# Patient Record
Sex: Male | Born: 1990 | Race: White | Hispanic: No | Marital: Single | Smoking: Former smoker
Health system: Southern US, Community
[De-identification: ages and names within clinical notes are randomized; demographics above are authoritative.]

## PROBLEM LIST (undated history)

## (undated) DIAGNOSIS — J45909 Unspecified asthma, uncomplicated: Secondary | ICD-10-CM

## (undated) DIAGNOSIS — I509 Heart failure, unspecified: Secondary | ICD-10-CM

## (undated) DIAGNOSIS — F1110 Opioid abuse, uncomplicated: Secondary | ICD-10-CM

---

## 2011-07-14 ENCOUNTER — Emergency Department (HOSPITAL_COMMUNITY)
Admission: EM | Admit: 2011-07-14 | Discharge: 2011-07-14 | Disposition: A | Discharge status: Home / Self Care | Payer: Self-pay | Attending: Emergency Medicine | Admitting: Emergency Medicine

## 2011-07-14 DIAGNOSIS — F1910 Other psychoactive substance abuse, uncomplicated: Secondary | ICD-10-CM | POA: Insufficient documentation

## 2011-07-14 DIAGNOSIS — Z0000 Encounter for general adult medical examination without abnormal findings: Secondary | ICD-10-CM | POA: Insufficient documentation

## 2011-07-14 DIAGNOSIS — J45909 Unspecified asthma, uncomplicated: Secondary | ICD-10-CM | POA: Insufficient documentation

## 2012-04-09 ENCOUNTER — Encounter (HOSPITAL_COMMUNITY): Payer: Self-pay | Admitting: Family Medicine

## 2012-04-09 ENCOUNTER — Emergency Department (HOSPITAL_COMMUNITY)
Admission: EM | Admit: 2012-04-09 | Discharge: 2012-04-09 | Disposition: A | Discharge status: Home / Self Care | Payer: No Typology Code available for payment source | Attending: Emergency Medicine | Admitting: Emergency Medicine

## 2012-04-09 DIAGNOSIS — M62838 Other muscle spasm: Secondary | ICD-10-CM | POA: Insufficient documentation

## 2012-04-09 DIAGNOSIS — M542 Cervicalgia: Secondary | ICD-10-CM | POA: Insufficient documentation

## 2012-04-09 DIAGNOSIS — Z7982 Long term (current) use of aspirin: Secondary | ICD-10-CM | POA: Insufficient documentation

## 2012-04-09 DIAGNOSIS — M25519 Pain in unspecified shoulder: Secondary | ICD-10-CM | POA: Insufficient documentation

## 2012-04-09 HISTORY — DX: Unspecified asthma, uncomplicated: J45.909

## 2012-04-09 MED ORDER — METHOCARBAMOL 500 MG PO TABS: 1000.0000 mg | ORAL_TABLET | Freq: Four times a day (QID) | ORAL | Status: AC

## 2012-04-09 MED ORDER — IBUPROFEN 800 MG PO TABS: 800.0000 mg | ORAL_TABLET | Freq: Three times a day (TID) | ORAL | Status: AC | PRN
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
## 2012-04-09 NOTE — ED Provider Notes (Signed)
Medical screening examination/treatment/procedure(s) were performed by non-physician practitioner and as supervising physician I was immediately available for consultation/collaboration.   Dayton Bailiff, MD 04/09/12 2226
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
## 2013-01-25 ENCOUNTER — Encounter (HOSPITAL_COMMUNITY): Payer: Self-pay | Admitting: *Deleted

## 2013-01-25 ENCOUNTER — Emergency Department (HOSPITAL_COMMUNITY)
Admission: EM | Admit: 2013-01-25 | Discharge: 2013-01-26 | Disposition: A | Discharge status: Rehabilitation Facility | Payer: Self-pay | Attending: Emergency Medicine | Admitting: Emergency Medicine

## 2013-01-25 DIAGNOSIS — J45909 Unspecified asthma, uncomplicated: Secondary | ICD-10-CM | POA: Insufficient documentation

## 2013-01-25 DIAGNOSIS — F17200 Nicotine dependence, unspecified, uncomplicated: Secondary | ICD-10-CM | POA: Insufficient documentation

## 2013-01-25 DIAGNOSIS — R4589 Other symptoms and signs involving emotional state: Secondary | ICD-10-CM | POA: Insufficient documentation

## 2013-01-25 DIAGNOSIS — F19939 Other psychoactive substance use, unspecified with withdrawal, unspecified: Secondary | ICD-10-CM | POA: Insufficient documentation

## 2013-01-25 DIAGNOSIS — Z7982 Long term (current) use of aspirin: Secondary | ICD-10-CM | POA: Insufficient documentation

## 2013-01-25 DIAGNOSIS — F1123 Opioid dependence with withdrawal: Secondary | ICD-10-CM

## 2013-01-25 DIAGNOSIS — R6883 Chills (without fever): Secondary | ICD-10-CM | POA: Insufficient documentation

## 2013-01-25 DIAGNOSIS — R110 Nausea: Secondary | ICD-10-CM | POA: Insufficient documentation

## 2013-01-25 LAB — CBC
HCT: 42.8 % (ref 39.0–52.0)
MCH: 29.5 pg (ref 26.0–34.0)
MCV: 85.4 fL (ref 78.0–100.0)
RBC: 5.01 MIL/uL (ref 4.22–5.81)
WBC: 6 10*3/uL (ref 4.0–10.5)

## 2013-01-25 LAB — RAPID URINE DRUG SCREEN, HOSP PERFORMED
Opiates: POSITIVE — AB
Tetrahydrocannabinol: POSITIVE — AB

## 2013-01-25 LAB — COMPREHENSIVE METABOLIC PANEL
BUN: 12 mg/dL (ref 6–23)
CO2: 31 mEq/L (ref 19–32)
Chloride: 97 mEq/L (ref 96–112)
Creatinine, Ser: 0.9 mg/dL (ref 0.50–1.35)
GFR calc Af Amer: >90 mL/min (ref >90)
GFR calc non Af Amer: >90 mL/min (ref >90)
Glucose, Bld: 86 mg/dL (ref 70–99)
Total Bilirubin: 0.3 mg/dL (ref 0.3–1.2)

## 2013-01-25 LAB — ETHANOL: Alcohol, Ethyl (B): <11 mg/dL (ref 0–11)

## 2013-01-25 MED ORDER — METHOCARBAMOL 500 MG PO TABS: 500.0000 mg | ORAL_TABLET | Freq: Three times a day (TID) | ORAL | Status: DC | PRN

## 2013-01-25 MED ORDER — NAPROXEN 500 MG PO TABS: 500.0000 mg | ORAL_TABLET | Freq: Two times a day (BID) | ORAL | Status: DC | PRN

## 2013-01-25 MED ORDER — HYDROXYZINE HCL 25 MG PO TABS: 25.0000 mg | ORAL_TABLET | Freq: Four times a day (QID) | ORAL | Status: DC | PRN

## 2013-01-25 MED ORDER — CLONIDINE HCL 0.1 MG PO TABS: 0.1000 mg | ORAL_TABLET | ORAL | Status: DC

## 2013-01-25 MED ORDER — LOPERAMIDE HCL 2 MG PO CAPS: 2.0000 mg | ORAL_CAPSULE | ORAL | Status: DC | PRN

## 2013-01-25 MED ORDER — NICOTINE 21 MG/24HR TD PT24: 21.0000 mg | MEDICATED_PATCH | Freq: Every day | TRANSDERMAL | Status: DC

## 2013-01-25 MED ORDER — CLONIDINE HCL 0.1 MG PO TABS: 0.1000 mg | ORAL_TABLET | Freq: Every day | ORAL | Status: DC

## 2013-01-25 MED ORDER — DICYCLOMINE HCL 20 MG PO TABS: 20.0000 mg | ORAL_TABLET | Freq: Four times a day (QID) | ORAL | Status: DC | PRN

## 2013-01-25 MED ORDER — ONDANSETRON 4 MG PO TBDP: 4.0000 mg | ORAL_TABLET | Freq: Four times a day (QID) | ORAL | Status: DC | PRN

## 2013-01-25 MED ORDER — CLONIDINE HCL 0.1 MG PO TABS
0.1000 mg | ORAL_TABLET | Freq: Four times a day (QID) | ORAL | Status: DC
  Administered 2013-01-26: 0.1 mg via ORAL
  Filled 2013-01-25: qty 1

## 2013-01-25 NOTE — ED Notes (Signed)
Pt states he's here for detox from heroin, states smokes weed sometimes and takes benzo's sometimes but mainly wants detox from heroin, pt states last time he did heroin was couple hours ago, denies pain, denies SI/HI.

## 2013-01-25 NOTE — ED Provider Notes (Signed)
History    This chart was scribed for non-physician practitioner Renne Crigler PA-C working with Richardean Canal, MD by Smitty Pluck, ED scribe. This patient was seen in room WTR4/WLPT4 and the patient's care was started at 6:02 PM.   CSN: [id number]  Arrival date & time 01/25/13  1752      Chief Complaint  Patient presents with  . Medical Clearance    The history is provided by the patient and medical records. No language interpreter was used.   Clifford Clark is a 22 y.o. male who presents to the Emergency Department due to detox from heroin. He reports that he was clean for 5 months but relapsed 2 months ago. He lasted used 0.5 grams of heroin today. He states he is on the waiting list for recovery facility in [place]. He states he has been to [facility] in [place] for detox in the past. He states he smokes weed and uses benzos occasionally. He states that when he withdraws he has nausea, restlessness and episodes of hot/chills. Pt denies SI, HI, hallucinations, fever, chills, nausea, vomiting, diarrhea, weakness, cough, SOB and any other pain. Aggravating factors: none. Alleviating factors: none.      Past Medical History  Diagnosis Date  . Asthma     History reviewed. No pertinent past surgical history.  History reviewed. No pertinent family history.  History  Substance Use Topics  . Smoking status: Current Some Day Smoker  . Smokeless tobacco: Never Used  . Alcohol Use: No      Review of Systems  Constitutional: Negative for fever and chills.  HENT: Negative for sore throat and rhinorrhea.   Eyes: Negative for redness.  Respiratory: Negative for cough and shortness of breath.   Cardiovascular: Negative for chest pain.  Gastrointestinal: Positive for nausea. Negative for vomiting, abdominal pain and diarrhea.  Genitourinary: Negative for dysuria.  Musculoskeletal: Negative for myalgias.  Skin: Negative for rash.  Neurological: Negative for weakness and headaches.     Allergies  Review of patient's allergies indicates no known allergies.  Home Medications   Current Outpatient Rx  Name  Route  Sig  Dispense  Refill  . aspirin EC 81 MG tablet   Oral   Take 324 mg by mouth once.           BP 123/65  Pulse 78  Temp(Src) 98.6 F (37 C) (Oral)  Resp 16  Ht 5\' 7"  (1.702 m)  Wt 145 lb (65.772 kg)  BMI 22.71 kg/m2  SpO2 98%  Physical Exam  Nursing note and vitals reviewed. Constitutional: He is oriented to person, place, and time. He appears well-developed and well-nourished. No distress.  HENT:  Head: Normocephalic and atraumatic.  Eyes: EOM are normal.  Neck: Neck supple. No tracheal deviation present.  Cardiovascular: Normal rate.   Pulmonary/Chest: Effort normal. No respiratory distress.  Musculoskeletal: Normal range of motion.  Neurological: He is alert and oriented to person, place, and time.  Skin: Skin is warm and dry.  Psychiatric: He has a normal mood and affect. His behavior is normal.    ED Course  Procedures (including critical care time) DIAGNOSTIC STUDIES: Oxygen Saturation is 98% on room air, normal by my interpretation.    COORDINATION OF CARE: 6:05 PM Discussed ED treatment with pt and pt agrees.     Labs Reviewed  COMPREHENSIVE METABOLIC PANEL - Abnormal; Notable for the following:    Potassium 3.4 (*)    All other components within normal limits  URINE RAPID DRUG SCREEN (HOSP PERFORMED) - Abnormal; Notable for the following:    Opiates POSITIVE (*)    Tetrahydrocannabinol POSITIVE (*)    All other components within normal limits  CBC  ETHANOL   No results found.   1. Heroin withdrawal     Patient seen and examined. Work-up initiated. Clonidine protocol initiated. Patient requests inpt detox.   Vital signs reviewed and are as follows: Filed Vitals:   01/25/13 1854  BP: 109/64  Pulse: 75  Temp:   Resp: 16   7:08 PM Spoke with [facility] who will see. [facility] may be possibility tomorrow.    MDM   Heroin detox. Pt medically cleared.       I personally performed the services described in this documentation, which was scribed in my presence. The recorded information has been reviewed and is accurate.    Renne Crigler, PA-C 01/25/13 1913

## 2013-01-25 NOTE — ED Notes (Signed)
Pt resting quietly. Eating sandwich and reading a book. No complaints at this time. Pt is calm, cooperative.
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
## 2013-01-25 NOTE — BH Assessment (Signed)
Assessment Note   Clifford Clark is an 22 y.o. male who presents to [facility] seeking detox from heroin.  He reports that his life isn't moving forward and he recognizes that that is because of his drug use.  He reports using a half gram of heroin daily for the last 1-2 mos. He has detoxed previously, this time last year, and was sober for 6 mos.  He denies SI, HI-thoughts, plan or intent, now or in the last six months, and also denies AVH.  He is calm and cooperative and motivated for treatment.  He not a veteran, has no open wounds, denies any history of assault or sexual assault.    Axis I: Opioid Dependence Axis II: Deferred Axis III:  Past Medical History  Diagnosis Date  . Asthma    Axis IV: economic problems, housing problems and problems with access to health care services Axis V: 41-50 serious symptoms  Past Medical History:  Past Medical History  Diagnosis Date  . Asthma     History reviewed. No pertinent past surgical history.  Family History: History reviewed. No pertinent family history.  Social History:  reports that he has been smoking.  He has never used smokeless tobacco. He reports that he uses illicit drugs (Marijuana). He reports that he does not drink alcohol.  Additional Social History:  Alcohol / Drug Use History of alcohol / drug use?: Yes Longest period of sobriety (when/how long): 6 mos Negative Consequences of Use: Personal relationships;Work / Mining engineer #1 Name of Substance 1: Heroin 1 - Age of First Use: 19 1 - Amount (size/oz): .5 gram 1 - Frequency: daily 1 - Duration: 1-2 mos 1 - Last Use / Amount: 01/25/13 .5 gram Substance #2 Name of Substance 2: Xanax 2 - Age of First Use: 15 2 - Amount (size/oz): 2mg  2 - Frequency: 1 x a month 2 - Duration: 8 years 2 - Last Use / Amount: 01/20/13 Substance #3 Name of Substance 3: Marijuana 3 - Age of First Use: 14 3 - Amount (size/oz): gram 3 - Frequency: weekly 3 - Duration: ongoing 3 - Last Use /  Amount: 01/24/13  CIWA: CIWA-Ar BP: 109/64 mmHg Pulse Rate: 75 COWS: Clinical Opiate Withdrawal Scale (COWS) Resting Pulse Rate: Pulse Rate 80 or below Sweating: No report of chills or flushing Restlessness: Able to sit still Pupil Size: Pupils pinned or normal size for room light Bone or Joint Aches: Not present Runny Nose or Tearing: Not present GI Upset: No GI symptoms Tremor: No tremor Yawning: No yawning Anxiety or Irritability: None Gooseflesh Skin: Skin is smooth COWS Total Score: 0  Allergies: No Known Allergies  Home Medications:  (Not in a hospital admission)  OB/GYN Status:  No LMP for male patient.  General Assessment Data Location of Assessment: [facility] ED Living Arrangements: Alone Can pt return to current living arrangement?: Yes Admission Status: Voluntary Is patient capable of signing voluntary admission?: Yes Transfer from: Acute Hospital Referral Source: Psychiatrist  Education Status Is patient currently in school?: No Highest grade of school patient has completed: 12  Risk to self Suicidal Ideation: No Suicidal Intent: No Is patient at risk for suicide?: No Suicidal Plan?: No Access to Means: No What has been your use of drugs/alcohol within the last 12 months?: ongoing Previous Attempts/Gestures: No Intentional Self Injurious Behavior: None Family Suicide History: No Recent stressful life event(s): Recent negative physical changes (life isn't moving forward) Persecutory voices/beliefs?: No Depression: No Substance abuse history and/or treatment for substance  abuse?: Yes Suicide prevention information given to non-admitted patients: Yes  Risk to Others Homicidal Ideation: No Thoughts of Harm to Others: No Current Homicidal Intent: No Current Homicidal Plan: No Access to Homicidal Means: No History of harm to others?: No Assessment of Violence: None Noted Does patient have access to weapons?: No Criminal Charges Pending?: No Does patient  have a court date: No  Psychosis Hallucinations: None noted Delusions: None noted  Mental Status Report Appear/Hygiene: Other (Comment) (neat) Eye Contact: Good Motor Activity: Freedom of movement Speech: Logical/coherent Level of Consciousness: Alert Mood: Other (Comment) Affect: Appropriate to circumstance Anxiety Level: None Thought Processes: Coherent;Relevant Judgement: Unimpaired Orientation: Person;Place;Time;Situation Obsessive Compulsive Thoughts/Behaviors: None  Cognitive Functioning Concentration: Normal Memory: Recent Intact;Remote Intact IQ: Average Insight: Good Impulse Control: Poor Appetite: Fair Sleep: No Change Total Hours of Sleep: 7 Vegetative Symptoms: None  ADLScreening [facility] Assessment Services) Patient's cognitive ability adequate to safely complete daily activities?: Yes Patient able to express need for assistance with ADLs?: Yes Independently performs ADLs?: Yes (appropriate for developmental age)  Abuse/Neglect [facility]) Physical Abuse: Denies Verbal Abuse: Denies Sexual Abuse: Denies  Prior Inpatient Therapy Prior Inpatient Therapy: Yes Prior Therapy Dates: 2012 Prior Therapy Facilty/Provider(s): [facility] Reason for Treatment: Heroin Detox and treatment  Prior Outpatient Therapy Prior Outpatient Therapy: Yes Prior Therapy Dates: ongoing Prior Therapy Facilty/Provider(s): [facility] Reason for Treatment: SA  ADL Screening (condition at time of admission) Patient's cognitive ability adequate to safely complete daily activities?: Yes Patient able to express need for assistance with ADLs?: Yes Independently performs ADLs?: Yes (appropriate for developmental age) Weakness of Legs: None Weakness of Arms/Hands: None       Abuse/Neglect Assessment (Assessment to be complete while patient is alone) Physical Abuse: Denies Verbal Abuse: Denies Sexual Abuse: Denies Exploitation of patient/patient's resources: Denies Values / Beliefs Cultural  Requests During Hospitalization: None Spiritual Requests During Hospitalization: None   Advance Directives (For Healthcare) Advance Directive: Patient does not have advance directive Pre-existing out of facility DNR order (yellow form or pink MOST form): No Nutrition Screen- [facility] Adult/WL/AP Patient's home diet: Regular  Additional Information 1:1 In Past 12 Months?: No CIRT Risk: No Elopement Risk: No Does patient have medical clearance?: Yes     Disposition:  Disposition Initial Assessment Completed for this Encounter: Yes Disposition of Patient: Inpatient treatment program Type of inpatient treatment program: Adult  On Site Evaluation by:   Reviewed with Physician:     Steward Ros 01/25/2013 9:34 PM

## 2013-01-25 NOTE — ED Provider Notes (Signed)
Medical screening examination/treatment/procedure(s) were performed by non-physician practitioner and as supervising physician I was immediately available for consultation/collaboration.   David H Yao, MD 01/25/13 2158 

## 2013-01-26 NOTE — ED Notes (Addendum)
Patient  up for discharge. Patient plans to drive self to residential treatment services. Please call them when he leaves to inform [facility] of when the patient leaves, so they will know when to expect him. At [phone].

## 2013-01-26 NOTE — ED Notes (Signed)
Pt discharged via self; pt driving self to residential services; stated understanding; complaint with treatment program; stable at time of discharge; facility called and notified of patients discharge
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
## 2013-01-26 NOTE — ED Notes (Signed)
Pt given sandwich 

## 2013-01-26 NOTE — BH Assessment (Signed)
BHH Assessment Progress Note      PT accepted to [facility].  Obtained authorization for treatment from [facility] at [place].  [id number] dates 01/26/13-01/28/13

## 2018-01-16 ENCOUNTER — Emergency Department (HOSPITAL_COMMUNITY)
Admission: EM | Admit: 2018-01-16 | Discharge: 2018-01-17 | Disposition: A | Discharge status: Home / Self Care | Payer: Self-pay | Attending: Emergency Medicine | Admitting: Emergency Medicine

## 2018-01-16 ENCOUNTER — Other Ambulatory Visit: Payer: Self-pay

## 2018-01-16 ENCOUNTER — Encounter (HOSPITAL_COMMUNITY): Payer: Self-pay

## 2018-01-16 ENCOUNTER — Emergency Department (HOSPITAL_COMMUNITY): Payer: Self-pay

## 2018-01-16 DIAGNOSIS — T401X1A Poisoning by heroin, accidental (unintentional), initial encounter: Secondary | ICD-10-CM | POA: Insufficient documentation

## 2018-01-16 DIAGNOSIS — F17210 Nicotine dependence, cigarettes, uncomplicated: Secondary | ICD-10-CM | POA: Insufficient documentation

## 2018-01-16 LAB — COMPREHENSIVE METABOLIC PANEL
ALT: 114 U/L — AB (ref 17–63)
AST: 105 U/L — AB (ref 15–41)
Albumin: 4 g/dL (ref 3.5–5.0)
Alkaline Phosphatase: 87 U/L (ref 38–126)
Anion gap: 10 (ref 5–15)
BILIRUBIN TOTAL: 0.6 mg/dL (ref 0.3–1.2)
BUN: 14 mg/dL (ref 6–20)
CO2: 29 mmol/L (ref 22–32)
CREATININE: 1.03 mg/dL (ref 0.61–1.24)
Calcium: 9.3 mg/dL (ref 8.9–10.3)
Chloride: 102 mmol/L (ref 101–111)
Glucose, Bld: 114 mg/dL — ABNORMAL HIGH (ref 65–99)
Potassium: 3.3 mmol/L — ABNORMAL LOW (ref 3.5–5.1)
Sodium: 141 mmol/L (ref 135–145)
TOTAL PROTEIN: 6.8 g/dL (ref 6.5–8.1)

## 2018-01-16 LAB — CBC
HCT: 41.6 % (ref 39.0–52.0)
Hemoglobin: 13.6 g/dL (ref 13.0–17.0)
MCH: 29.5 pg (ref 26.0–34.0)
MCHC: 32.7 g/dL (ref 30.0–36.0)
MCV: 90.2 fL (ref 78.0–100.0)
PLATELETS: 291 10*3/uL (ref 150–400)
RBC: 4.61 MIL/uL (ref 4.22–5.81)
RDW: 12.7 % (ref 11.5–15.5)
WBC: 9.4 10*3/uL (ref 4.0–10.5)

## 2018-01-16 LAB — ETHANOL

## 2018-01-16 LAB — SALICYLATE LEVEL

## 2018-01-16 LAB — CBG MONITORING, ED: GLUCOSE-CAPILLARY: 105 mg/dL — AB (ref 65–99)

## 2018-01-16 LAB — ACETAMINOPHEN LEVEL: Acetaminophen (Tylenol), Serum: <10 ug/mL — ABNORMAL LOW (ref 10–30)

## 2018-01-16 MED ORDER — ONDANSETRON HCL 4 MG/2ML IJ SOLN
4.0000 mg | Freq: Once | INTRAMUSCULAR | Status: AC
  Administered 2018-01-16: 4 mg via INTRAVENOUS
  Filled 2018-01-16: qty 2

## 2018-01-16 MED ORDER — SODIUM CHLORIDE 0.9 % IV BOLUS
1000.0000 mL | Freq: Once | INTRAVENOUS | Status: AC
  Administered 2018-01-16: 1000 mL via INTRAVENOUS

## 2018-01-16 NOTE — ED Notes (Signed)
Clifford Clark H[place] is patient's sponsor. [phone]. He will pick patient up.
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
## 2018-01-16 NOTE — ED Notes (Signed)
Urinal, labeled specimen cup at bedside. Pt encouraged to provide sample when possible; call bell at bedside for ring for assistance PRN. [employer]

## 2018-01-16 NOTE — ED Provider Notes (Signed)
[facility]-EMERGENCY DEPT Provider Note   CSN: [id number] Arrival date & time: 01/16/18  2202     History   Chief Complaint Chief Complaint  Patient presents with  . Drug Overdose    HPI Clifford Clark is a 27 y.o. male presenting for evaluation after overdose.  Patient states that he shot heroin into his veins this evening.  He is not sure exactly what time this was, approximately 9:00.  This is last thing he remembers.  He then was being woken up by EMS.  He denies other drug use.  No other medical problems, does not take medications daily.  He states that her own was not cut with anything.  He denies recent fever, chills, chest pain, shortness breath, cough, nausea, vomiting, abdominal pain, urinary symptoms, abnormal bowel movements.  He currently reports nausea and cervical spine pain.  Pt states he was not using drugs to try and kill himself, denies SI, HI, or AVH.  Per [employer], patient was picked up by his sponsor around 4:00 this afternoon.  He appeared high at the time.  Around 9:00, patient went to take a shower.  915, a checked on the patient and he was slumped over in the bathroom and not breathing.  He had strong pulses.  Compressions were started until EMS arrived.  Patient was given Narcan and immediately became alert and oriented. He has remained alert since.   HPI  Past Medical History:  Diagnosis Date  . Asthma     There are no active problems to display for this patient.   History reviewed. No pertinent surgical history.      Home Medications    Prior to Admission medications   Medication Sig Start Date End Date Taking? Authorizing Provider  naloxone [facility]) nasal spray 4 mg/0.1 mL Use as needed for heroin overdose 01/17/18   Morio Widen, PA-C    Family History History reviewed. No pertinent family history.  Social History Social History   Tobacco Use  . Smoking status: Current Some Day Smoker  . Smokeless tobacco: Never  Used  Substance Use Topics  . Alcohol use: No  . Drug use: Yes    Types: Marijuana, IV    Comment: Heroin     Allergies   Patient has no known allergies.   Review of Systems Review of Systems  Respiratory: Positive for apnea (resolved).   Cardiovascular: Positive for chest pain (soreness from compressions).  Musculoskeletal: Positive for neck pain.  All other systems reviewed and are negative.   Physical Exam Updated Vital Signs BP 109/68   Pulse 79   Resp 13   SpO2 97%   Physical Exam  Constitutional: He is oriented to person, place, and time. He appears well-developed and well-nourished. No distress.  Resting comfortably in bed in no apparent distress.  HENT:  Head: Normocephalic and atraumatic.  Eyes: Pupils are equal, round, and reactive to light. Conjunctivae and EOM are normal.  Neck: Normal range of motion. Neck supple.  TTP of midline cspine. No TTP of bilateral neck musculature.   Cardiovascular: Normal rate, regular rhythm and intact distal pulses.  Pulmonary/Chest: Effort normal and breath sounds normal. No respiratory distress. He has no wheezes. He exhibits tenderness.  TTP of central chest where compressions were performed. No obvious deformity. Clear lung sounds in all fields.   Abdominal: Soft. Bowel sounds are normal. He exhibits no distension and no mass. There is no tenderness. There is no guarding.  Musculoskeletal: Normal range of  motion.  Neurological: He is alert and oriented to person, place, and time.  Patient tired, but will arouse with minimal stimuli and respond to questions appropriately before falling back asleep.   Skin: Skin is warm and dry.  Psychiatric: He has a normal mood and affect.  Nursing note and vitals reviewed.    ED Treatments / Results  Labs (all labs ordered are listed, but only abnormal results are displayed) Labs Reviewed  COMPREHENSIVE METABOLIC PANEL - Abnormal; Notable for the following components:      Result  Value   Potassium 3.3 (*)    Glucose, Bld 114 (*)    AST 105 (*)    ALT 114 (*)    All other components within normal limits  ACETAMINOPHEN LEVEL - Abnormal; Notable for the following components:   Acetaminophen (Tylenol), Serum <10 (*)    All other components within normal limits  CBG MONITORING, ED - Abnormal; Notable for the following components:   Glucose-Capillary 105 (*)    All other components within normal limits  ETHANOL  SALICYLATE LEVEL  CBC  RAPID URINE DRUG SCREEN, HOSP PERFORMED    EKG EKG Interpretation  Date/Time:  Thursday January 16 2018 22:23:49 EDT Ventricular Rate:  107 PR Interval:    QRS Duration: 108 QT Interval:  363 QTC Calculation: 485 R Axis:   -34 Text Interpretation:  Sinus tachycardia Probable left atrial enlargement Incomplete RBBB and LAFB RSR' in V1 or V2, right VCD or RVH Nonspecific T abnormalities, lateral leads Borderline prolonged QT interval No old tracing to compare Confirmed by Melene PlanFloyd, Dan [phone]([id number]) on 01/16/2018 10:34:41 PM   Radiology Dg Chest 2 View  Result Date: 01/16/2018 CLINICAL DATA:  Chest pain. Heroin overdose. Recent chest compressions. EXAM: CHEST - 2 VIEW COMPARISON:  None. FINDINGS: The heart size and mediastinal contours are within normal limits. Both lungs are clear. The visualized skeletal structures are unremarkable. IMPRESSION: No active cardiopulmonary disease. Electronically Signed   By: Deatra RobinsonKevin  Herman M.D.   On: 01/16/2018 22:56   Ct Cervical Spine Wo Contrast  Result Date: 01/16/2018 CLINICAL DATA:  27 year old male with C-spine trauma. EXAM: CT CERVICAL SPINE WITHOUT CONTRAST TECHNIQUE: Multidetector CT imaging of the cervical spine was performed without intravenous contrast. Multiplanar CT image reconstructions were also generated. COMPARISON:  None. FINDINGS: Alignment: Normal. Skull base and vertebrae: No acute fracture. No primary bone lesion or focal pathologic process. Soft tissues and spinal canal: No prevertebral  fluid or swelling. No visible canal hematoma. Disc levels:  No acute findings.  No degenerative changes. Upper chest: Negative. Other: None IMPRESSION: No acute/traumatic cervical spine pathology. Electronically Signed   By: Elgie CollardArash  Radparvar M.D.   On: 01/16/2018 23:19    Procedures Procedures (including critical care time)  Medications Ordered in ED Medications  ondansetron (ZOFRAN) injection 4 mg (4 mg Intravenous Given 01/16/18 2228)  sodium chloride 0.9 % bolus 1,000 mL (0 mLs Intravenous Stopped 01/17/18 0011)     Initial Impression / Assessment and Plan / ED Course  I have reviewed the triage vital signs and the nursing notes.  Pertinent labs & imaging results that were available during my care of the patient were reviewed by me and considered in my medical decision making (see chart for details).     Patient presenting for evaluation after heroin overdose.  Patient reports pain of the chest and cervical spine.  No pain elsewhere.  Alert and oriented, although tired.  Patient reports nausea, but requesting something to drink.  Will  give Zofran.  Mildly tachycardic, will hang fluids for dehydration.  Chest x-ray and cervical spine CT ordered.  Nausea improved with Zofran.  Chest x-ray reviewed and interpreted by me, no sign of injury or pneumothorax.  Cervical CT without sign of fracture or dislocation.  Patient remains alert.  Will give couple water for p.o. challenge and ambulate.  Plan for discharge.  Will discharge with nasal Narcan and information on follow-up.  Peer support consult ordered.  Discussed that he should not continue to use drugs.  At this time, patient appears safe for discharge.  Return precautions given.  Patient states he understands and agrees plan.   Final Clinical Impressions(s) / ED Diagnoses   Final diagnoses:  Accidental overdose of heroin, initial encounter [facility])    ED Discharge Orders        Ordered    naloxone [facility]) nasal spray 4 mg/0.1 mL      01/17/18 0001       Tasnia Spegal, PA-C 01/17/18 0231    Melene Plan, DO 01/17/18 1528

## 2018-01-16 NOTE — ED Notes (Signed)
Bed: RESA Expected date:  Expected time:  Means of arrival:  Comments: Heroin overdose 

## 2018-01-17 ENCOUNTER — Telehealth (HOSPITAL_COMMUNITY): Payer: Self-pay

## 2018-01-17 MED ORDER — NALOXONE HCL 4 MG/0.1ML NA LIQD: NASAL | 0 refills | Status: DC

## 2018-01-17 NOTE — Discharge Instructions (Addendum)
Stop using heroin and other drugs. There is information about counseling and substance abuse programs in the area included in the paperwork.  Use the narcan as needed for heroin overdose.  Return to the ER as needed for difficulty breathing, cheat pain, or any new or concerning symptoms.

## 2018-02-27 ENCOUNTER — Emergency Department (HOSPITAL_COMMUNITY): Admission: EM | Admit: 2018-02-27 | Discharge: 2018-02-27 | Discharge status: Against Medical Advice | Payer: Self-pay

## 2018-02-27 ENCOUNTER — Other Ambulatory Visit: Payer: Self-pay

## 2018-02-27 NOTE — ED Notes (Signed)
Pt stepped out. Pt's mother said that he may not return. Will try to call pt again.

## 2018-02-27 NOTE — ED Notes (Signed)
Called pt callled again in waiting room, no answer

## 2018-02-28 ENCOUNTER — Emergency Department (HOSPITAL_COMMUNITY)
Admission: EM | Admit: 2018-02-28 | Discharge: 2018-02-28 | Disposition: A | Discharge status: Home / Self Care | Payer: Self-pay | Attending: Emergency Medicine | Admitting: Emergency Medicine

## 2018-02-28 ENCOUNTER — Encounter (HOSPITAL_COMMUNITY): Payer: Self-pay

## 2018-02-28 ENCOUNTER — Other Ambulatory Visit: Payer: Self-pay

## 2018-02-28 ENCOUNTER — Encounter (HOSPITAL_COMMUNITY): Payer: Self-pay | Admitting: Emergency Medicine

## 2018-02-28 ENCOUNTER — Emergency Department (HOSPITAL_COMMUNITY)
Admission: EM | Admit: 2018-02-28 | Discharge: 2018-03-01 | Disposition: A | Discharge status: Home / Self Care | Payer: Self-pay | Attending: Emergency Medicine | Admitting: Emergency Medicine

## 2018-02-28 DIAGNOSIS — F1110 Opioid abuse, uncomplicated: Secondary | ICD-10-CM

## 2018-02-28 DIAGNOSIS — F1120 Opioid dependence, uncomplicated: Secondary | ICD-10-CM | POA: Insufficient documentation

## 2018-02-28 DIAGNOSIS — F17200 Nicotine dependence, unspecified, uncomplicated: Secondary | ICD-10-CM | POA: Insufficient documentation

## 2018-02-28 DIAGNOSIS — Z5321 Procedure and treatment not carried out due to patient leaving prior to being seen by health care provider: Secondary | ICD-10-CM | POA: Insufficient documentation

## 2018-02-28 DIAGNOSIS — Z0489 Encounter for examination and observation for other specified reasons: Secondary | ICD-10-CM | POA: Insufficient documentation

## 2018-02-28 DIAGNOSIS — J45909 Unspecified asthma, uncomplicated: Secondary | ICD-10-CM | POA: Insufficient documentation

## 2018-02-28 DIAGNOSIS — F1910 Other psychoactive substance abuse, uncomplicated: Secondary | ICD-10-CM

## 2018-02-28 HISTORY — DX: Opioid abuse, uncomplicated: F11.10

## 2018-02-28 LAB — CBC
HCT: 41.8 % (ref 39.0–52.0)
HCT: 40.9 % (ref 39.0–52.0)
HEMOGLOBIN: 13.9 g/dL (ref 13.0–17.0)
HEMOGLOBIN: 13.8 g/dL (ref 13.0–17.0)
MCH: 28.7 pg (ref 26.0–34.0)
MCH: 29.1 pg (ref 26.0–34.0)
MCHC: 33.3 g/dL (ref 30.0–36.0)
MCHC: 33.7 g/dL (ref 30.0–36.0)
MCV: 86.2 fL (ref 78.0–100.0)
MCV: 86.3 fL (ref 78.0–100.0)
PLATELETS: 320 10*3/uL (ref 150–400)
Platelets: 329 10*3/uL (ref 150–400)
RBC: 4.85 MIL/uL (ref 4.22–5.81)
RBC: 4.74 MIL/uL (ref 4.22–5.81)
RDW: 12.1 % (ref 11.5–15.5)
RDW: 12.1 % (ref 11.5–15.5)
WBC: 7.8 10*3/uL (ref 4.0–10.5)
WBC: 9.2 10*3/uL (ref 4.0–10.5)

## 2018-02-28 LAB — RAPID URINE DRUG SCREEN, HOSP PERFORMED
AMPHETAMINES: NOT DETECTED
Barbiturates: NOT DETECTED
Benzodiazepines: NOT DETECTED
COCAINE: NOT DETECTED
OPIATES: POSITIVE — AB
TETRAHYDROCANNABINOL: NOT DETECTED

## 2018-02-28 LAB — COMPREHENSIVE METABOLIC PANEL
ALT: 17 U/L (ref 17–63)
AST: 18 U/L (ref 15–41)
Albumin: 3.9 g/dL (ref 3.5–5.0)
Alkaline Phosphatase: 85 U/L (ref 38–126)
Anion gap: 9 (ref 5–15)
BUN: 11 mg/dL (ref 6–20)
CO2: 27 mmol/L (ref 22–32)
Calcium: 9.2 mg/dL (ref 8.9–10.3)
Chloride: 102 mmol/L (ref 101–111)
Creatinine, Ser: 0.84 mg/dL (ref 0.61–1.24)
GFR calc Af Amer: >60 mL/min (ref >60)
GFR calc non Af Amer: >60 mL/min (ref >60)
Glucose, Bld: 90 mg/dL (ref 65–99)
Potassium: 3.6 mmol/L (ref 3.5–5.1)
Sodium: 138 mmol/L (ref 135–145)
Total Bilirubin: 0.6 mg/dL (ref 0.3–1.2)
Total Protein: 6.6 g/dL (ref 6.5–8.1)

## 2018-02-28 LAB — ETHANOL: Alcohol, Ethyl (B): <10 mg/dL (ref <10)

## 2018-02-28 NOTE — ED Provider Notes (Cosign Needed)
Patient placed in Quick Look pathway, seen and evaluated   Chief Complaint: Assistance with detox  HPI:   Patient presents today for requesting help getting off drugs.  He reports that he has used heroin intermittently for the past 8 years.  Last use was earlier this morning, however he does not know when.  He denies any other drugs in the past week, however states that he may have taken a few hits off a marijuana blunt.   ROS: Denies SI, HI, AVH.  (one)  Physical Exam:   Gen: No distress  Neuro: Sleepy however is easily awakened by the door opening  Skin: Warm    Focused Exam: Respirations are even and nonlabored, he is sleepy but responds appropriately, he is awakened by the door opening, does not require excess stimulation.  Track marks on left AC.  No obvious infection.   Initiation of care has begun. The patient has been counseled on the process, plan, and necessity for staying for the completion/evaluation, and the remainder of the medical screening examination     Cristina Gong, Cordelia Poche 02/28/18 1326

## 2018-02-28 NOTE — ED Notes (Signed)
Pt called by tech Clide Cliff and this RN, no response

## 2018-02-28 NOTE — ED Triage Notes (Signed)
Pt presents for evaluation of drug abuse. Pt reports he would like assistance getting off drugs. States he uses heroin intermittently x 7-8 years. States last use this AM.

## 2018-02-28 NOTE — ED Triage Notes (Signed)
Pt presents with fiance at bedside, pt requesting assistance with detox from heroin, pt states he has been using 1/2-1 gm daily x 8 years. Last usage few hours ago.  Pt falling asleep during triage questioning.

## 2018-02-28 NOTE — ED Notes (Signed)
Called and told nurse first pt is not in lobby

## 2018-03-01 ENCOUNTER — Encounter (HOSPITAL_COMMUNITY): Payer: Self-pay | Admitting: Emergency Medicine

## 2018-03-01 ENCOUNTER — Other Ambulatory Visit: Payer: Self-pay

## 2018-03-01 DIAGNOSIS — J45909 Unspecified asthma, uncomplicated: Secondary | ICD-10-CM | POA: Insufficient documentation

## 2018-03-01 DIAGNOSIS — F17200 Nicotine dependence, unspecified, uncomplicated: Secondary | ICD-10-CM | POA: Insufficient documentation

## 2018-03-01 DIAGNOSIS — F1110 Opioid abuse, uncomplicated: Secondary | ICD-10-CM | POA: Insufficient documentation

## 2018-03-01 LAB — COMPREHENSIVE METABOLIC PANEL
ALT: 17 U/L (ref 17–63)
AST: 18 U/L (ref 15–41)
Albumin: 3.9 g/dL (ref 3.5–5.0)
Alkaline Phosphatase: 82 U/L (ref 38–126)
Anion gap: 8 (ref 5–15)
BILIRUBIN TOTAL: 0.7 mg/dL (ref 0.3–1.2)
BUN: 9 mg/dL (ref 6–20)
CO2: 30 mmol/L (ref 22–32)
CREATININE: 0.89 mg/dL (ref 0.61–1.24)
Calcium: 9.3 mg/dL (ref 8.9–10.3)
Chloride: 100 mmol/L — ABNORMAL LOW (ref 101–111)
GFR calc Af Amer: >60 mL/min (ref >60)
Glucose, Bld: 97 mg/dL (ref 65–99)
POTASSIUM: 3.5 mmol/L (ref 3.5–5.1)
Sodium: 138 mmol/L (ref 135–145)
TOTAL PROTEIN: 6.5 g/dL (ref 6.5–8.1)

## 2018-03-01 LAB — ETHANOL

## 2018-03-01 MED ORDER — ZOLPIDEM TARTRATE 5 MG PO TABS: 5.0000 mg | ORAL_TABLET | Freq: Every evening | ORAL | 0 refills | Status: DC | PRN

## 2018-03-01 MED ORDER — CLONIDINE HCL 0.1 MG PO TABS: 0.1000 mg | ORAL_TABLET | Freq: Four times a day (QID) | ORAL | 0 refills | Status: DC | PRN

## 2018-03-01 MED ORDER — PROMETHAZINE HCL 25 MG RE SUPP: 25.0000 mg | Freq: Four times a day (QID) | RECTAL | 0 refills | Status: DC | PRN

## 2018-03-01 MED ORDER — NALOXONE HCL 4 MG/0.1ML NA LIQD: NASAL | 0 refills | Status: DC

## 2018-03-01 NOTE — ED Provider Notes (Signed)
[facility] EMERGENCY DEPARTMENT Provider Note   CSN: [id number] Arrival date & time: 02/28/18  2103     History   Chief Complaint Chief Complaint  Patient presents with  . Wants detox    HPI Clifford Clark is a 27 y.o. male.  HPI 27 year old male presents the emergency department requesting detox from heroin.  Last use was several hours ago.  No vomiting.  No abdominal pain.  No other complaints at this time.  No HI or SI.   Past Medical History:  Diagnosis Date  . Asthma   . Opiate abuse, continuous (HCC)    Heroin    There are no active problems to display for this patient.   History reviewed. No pertinent surgical history.      Home Medications    Prior to Admission medications   Medication Sig Start Date End Date Taking? Authorizing Provider  cloNIDine (CATAPRES) 0.1 MG tablet Take 1 tablet (0.1 mg total) by mouth every 6 (six) hours as needed (withdrawl symptoms). 03/01/18   Azalia Bilis, MD  naloxone [facility]) nasal spray 4 mg/0.1 mL Use as needed for heroin overdose 03/01/18   Azalia Bilis, MD  promethazine (PHENERGAN) 25 MG suppository Place 1 suppository (25 mg total) rectally every 6 (six) hours as needed for nausea or vomiting. 03/01/18   Azalia Bilis, MD  zolpidem (AMBIEN) 5 MG tablet Take 1 tablet (5 mg total) by mouth at bedtime as needed for sleep. 03/01/18   Azalia Bilis, MD    Family History No family history on file.  Social History Social History   Tobacco Use  . Smoking status: Current Some Day Smoker  . Smokeless tobacco: Never Used  Substance Use Topics  . Alcohol use: Yes    Comment: rarely  . Drug use: Yes    Types: Marijuana, IV    Comment: Heroin     Allergies   Patient has no known allergies.   Review of Systems Review of Systems  All other systems reviewed and are negative.    Physical Exam Updated Vital Signs BP 125/77 (BP Location: Right Arm)   Pulse 98   Temp 98.6 F (37 C) (Oral)   Resp  15   Ht  (1.727 m)   Wt 63.5 kg (140 lb)   SpO2 97%   BMI 21.29 kg/m   Physical Exam  Constitutional: He is oriented to person, place, and time. He appears well-developed and well-nourished.  HENT:  Head: Normocephalic.  Eyes: EOM are normal.  Neck: Normal range of motion.  Pulmonary/Chest: Effort normal.  Abdominal: He exhibits no distension.  Musculoskeletal: Normal range of motion.  Neurological: He is alert and oriented to person, place, and time.  Psychiatric: He has a normal mood and affect.  Nursing note and vitals reviewed.    ED Treatments / Results  Labs (all labs ordered are listed, but only abnormal results are displayed) Labs Reviewed  COMPREHENSIVE METABOLIC PANEL - Abnormal; Notable for the following components:      Result Value   Chloride 100 (*)    All other components within normal limits  ETHANOL  CBC  RAPID URINE DRUG SCREEN, HOSP PERFORMED    EKG None  Radiology No results found.  Procedures Procedures (including critical care time)  Medications Ordered in ED Medications - No data to display   Initial Impression / Assessment and Plan / ED Course  I have reviewed the triage vital signs and the nursing notes.  Pertinent labs &  imaging results that were available during my care of the patient were reviewed by me and considered in my medical decision making (see chart for details).     Outpatient resources given.  Home with standard medications.  Patient understands return to the ER for new or worsening symptoms  Final Clinical Impressions(s) / ED Diagnoses   Final diagnoses:  Heroin abuse [facility])    ED Discharge Orders        Ordered    cloNIDine (CATAPRES) 0.1 MG tablet  Every 6 hours PRN     03/01/18 0229    promethazine (PHENERGAN) 25 MG suppository  Every 6 hours PRN     03/01/18 0229    zolpidem (AMBIEN) 5 MG tablet  At bedtime PRN     03/01/18 0229    naloxone (NARCAN) nasal spray 4 mg/0.1 mL     03/01/18 0229        Azalia Bilis, MD 03/01/18 [phone]

## 2018-03-01 NOTE — ED Notes (Signed)
Per fiance in waiting area, pt is "gathering his stuff" and coming back in.

## 2018-03-01 NOTE — ED Triage Notes (Signed)
Pt comes in requesting detox from heroin. Patient states he has used IV heroin for 8 years now and wants help to stop. Patient fiance at bedside and seems supportive. Patient last used this afternoon. Denies SI/HI or chronic alcohol use.

## 2018-03-02 ENCOUNTER — Emergency Department (HOSPITAL_COMMUNITY)
Admission: EM | Admit: 2018-03-02 | Discharge: 2018-03-02 | Disposition: A | Discharge status: Home / Self Care | Payer: Self-pay | Attending: Emergency Medicine | Admitting: Emergency Medicine

## 2018-03-02 DIAGNOSIS — F1110 Opioid abuse, uncomplicated: Secondary | ICD-10-CM

## 2018-03-02 NOTE — Discharge Instructions (Addendum)
Follow up with the detox facility tomorrow.  Stop using heroin.  Return to the ER if you develop persistent vomiting, diarrhea, or any new or concerning symptoms.

## 2018-03-02 NOTE — ED Provider Notes (Signed)
[facility]-EMERGENCY DEPT Provider Note   CSN: [id number] Arrival date & time: 03/01/18  2153     History   Chief Complaint Chief Complaint  Patient presents with  . Detox    HPI Clifford Clark is a 27 y.o. male presenting with request for detox of heroin.  Patient states he last used heroin last night with dinner.  He has been using heroin for the past 8 years.  He is requesting detox.  He was seen recently in the ER, and states that he was given information for outpatient resources and prescriptions, which she has not been able to fill yet.  He currently is symptom-free, denies nausea, vomiting, abdominal pain, or diarrhea.  He has no other medical problems, does not take medications daily.  He has been in touch with peers support, and contacted a detox facility in [place], which has accepted him on Monday 5/13.   HPI  Past Medical History:  Diagnosis Date  . Asthma   . Opiate abuse, continuous (HCC)    Heroin    There are no active problems to display for this patient.   History reviewed. No pertinent surgical history.      Home Medications    Prior to Admission medications   Medication Sig Start Date End Date Taking? Authorizing Provider  cloNIDine (CATAPRES) 0.1 MG tablet Take 1 tablet (0.1 mg total) by mouth every 6 (six) hours as needed (withdrawl symptoms). 03/01/18   Azalia Bilis, MD  naloxone [facility]) nasal spray 4 mg/0.1 mL Use as needed for heroin overdose 03/01/18   Azalia Bilis, MD  promethazine (PHENERGAN) 25 MG suppository Place 1 suppository (25 mg total) rectally every 6 (six) hours as needed for nausea or vomiting. 03/01/18   Azalia Bilis, MD  zolpidem (AMBIEN) 5 MG tablet Take 1 tablet (5 mg total) by mouth at bedtime as needed for sleep. 03/01/18   Azalia Bilis, MD    Family History History reviewed. No pertinent family history.  Social History Social History   Tobacco Use  . Smoking status: Current Some Day Smoker   Packs/day: 0.50  . Smokeless tobacco: Never Used  Substance Use Topics  . Alcohol use: Yes    Comment: rarely  . Drug use: Yes    Types: Marijuana, IV    Comment: Heroin     Allergies   Patient has no known allergies.   Review of Systems Review of Systems  Cardiovascular: Negative for chest pain.  Gastrointestinal: Negative for abdominal pain, nausea and vomiting.     Physical Exam Updated Vital Signs BP 113/70 (BP Location: Left Arm)   Pulse 73   Temp 97.8 F (36.6 C) (Oral)   Resp 18   Ht  (1.702 m)   Wt 63.5 kg (140 lb)   SpO2 99%   BMI 21.93 kg/m   Physical Exam  Constitutional: He is oriented to person, place, and time. He appears well-developed and well-nourished. No distress.  Pt appears in NAD  HENT:  Head: Normocephalic and atraumatic.  Eyes: EOM are normal.  Neck: Normal range of motion.  Cardiovascular: Normal rate, regular rhythm and intact distal pulses.  Pulmonary/Chest: Effort normal and breath sounds normal. No respiratory distress. He has no wheezes.  Abdominal: He exhibits no distension.  Musculoskeletal: Normal range of motion.  No tremors or shakes.  Ambulatory without difficulty.  Neurological: He is alert and oriented to person, place, and time.  Skin: Skin is warm. No rash noted.  Psychiatric: He  has a normal mood and affect.  Nursing note and vitals reviewed.    ED Treatments / Results  Labs (all labs ordered are listed, but only abnormal results are displayed) Labs Reviewed - No data to display  EKG None  Radiology No results found.  Procedures Procedures (including critical care time)  Medications Ordered in ED Medications - No data to display   Initial Impression / Assessment and Plan / ED Course  I have reviewed the triage vital signs and the nursing notes.  Pertinent labs & imaging results that were available during my care of the patient were reviewed by me and considered in my medical decision making (see  chart for details).     Pt requesting detox from heroin.  Discussed with patient that we do not admit to the hospital for detox from heroin.  Discussed that he needs to follow-up outpatient at the clinic that he has been accepted at on Monday.  Offered symptomatic medications at this time, but patient states he has no symptoms. Patient encouraged to fill the prescriptions from yesterday.  At this time, patient appears safe for discharge.  Return precautions given.  Patient states he understands and agrees to plan.  Final Clinical Impressions(s) / ED Diagnoses   Final diagnoses:  Heroin abuse [facility])    ED Discharge Orders    None       Alveria Apley, PA-C 03/02/18 0510    Dione Booze, MD 03/02/18 0730

## 2018-03-28 NOTE — Congregational Nurse Program (Signed)
Congregational Nurse Program Note  Date of Encounter: 03/26/2018  Past Medical History: Past Medical History:  Diagnosis Date  . Asthma   . Opiate abuse, continuous (HCC)    Heroin    Encounter Details: CNP Questionnaire - 03/26/18 1248      Questionnaire   Patient Status  Not Applicable    Race  White or Caucasian    Location Patient Served At  [facility]    Insurance  Not Applicable    Uninsured  Uninsured (NEW 1x/quarter)    Food  Yes, have food insecurities;Within past 12 months, worried food would run out with no money to buy more    Housing/Utilities  Yes, have permanent housing    Transportation  Yes, need transportation assistance    Interpersonal Safety  No, do not feel physically and emotionally safe where you currently live    Medical Provider  No    Referrals  Area Agency    ED Visit Averted  Not Applicable    Life-Saving Intervention Made  Not Applicable      No health related concerns this visit

## 2018-04-03 NOTE — Congregational Nurse Program (Signed)
Congregational Nurse Program Note  Date of Encounter: 04/02/2018  Past Medical History: Past Medical History:  Diagnosis Date  . Asthma   . Opiate abuse, continuous (HCC)    Heroin    Encounter Details: CNP Questionnaire - 04/02/18 1313      Questionnaire   Patient Status  Not Applicable    Race  White or Caucasian    [facility]    Insurance  Not Applicable    Uninsured  Uninsured (Subsequent visits/quarter)    Food  Yes, have food insecurities;Within past 12 months, worried food would run out with no money to buy more    Housing/Utilities  Yes, have permanent housing    Transportation  Yes, need transportation assistance    Interpersonal Safety  No, do not feel physically and emotionally safe where you currently live    Medication  Yes, have medication insecurities    Medical Provider  No    Referrals  Area Agency    ED Visit Averted  Not Applicable    Life-Saving Intervention Made  Not Applicable      Client voices no health concerns this visit

## 2018-05-23 NOTE — Congregational Nurse Program (Signed)
Congregational Nurse Program Note  Date of Encounter: 04/30/2018  Past Medical History: Past Medical History:  Diagnosis Date  . Asthma   . Opiate abuse, continuous (HCC)    Heroin    Encounter Details: CNP Questionnaire - 04/30/18 1022      Questionnaire   Patient Status  Not Applicable    Race  White or Caucasian    [facility]    Insurance  Not Applicable    Uninsured  Uninsured (NEW 1x/quarter)    Food  Yes, have food insecurities;Within past 12 months, worried food would run out with no money to buy more    Housing/Utilities  Yes, have permanent housing    Transportation  Yes, need transportation assistance    Interpersonal Safety  No, do not feel physically and emotionally safe where you currently live    Medication  Yes, have medication insecurities    Medical Provider  No    Referrals  Area Agency    ED Visit Averted  Not Applicable    Life-Saving Intervention Made  Not Applicable      Client reports, "I do not want to live this way anymore". States he knows it is going to be a matter of time before he OD and dies.   Requesting assistance with treatment.  Client [facility] [place].

## 2018-05-23 NOTE — Congregational Nurse Program (Signed)
Congregational Nurse Program Note  Date of Encounter: 05/22/2018  Past Medical History: Past Medical History:  Diagnosis Date  . Asthma   . Opiate abuse, continuous (HCC)    Heroin    Encounter Details: CNP Questionnaire - 05/22/18 1025      Questionnaire   Patient Status  Not Applicable    Race  White or Caucasian    Location Patient Served At  [facility]    Insurance  Not Applicable    Uninsured  Uninsured (Subsequent visits/quarter)    Food  Yes, have food insecurities;Within past 12 months, worried food would run out with no money to buy more    Housing/Utilities  Yes, have permanent housing    Transportation  Yes, need transportation assistance    Interpersonal Safety  No, do not feel physically and emotionally safe where you currently live    Medication  Yes, have medication insecurities    Medical Provider  No    Referrals  Not Applicable    ED Visit Averted  Not Applicable    Life-Saving Intervention Made  Not Applicable      Client "stopped by" the clinic to report that he is doing well.  Finished detox and is working on his recovery.  States has a job and feels positive about his life.  Affect bright.  Is currently looking for a sponsor and is attending recovery meetings

## 2018-06-16 NOTE — Congregational Nurse Program (Signed)
Client update - currently living at an [facility] and working 50-60 hours a week.  Discussed with client the importance of being involved in treatment.  States he is attending NA meetings but knows that is not enough

## 2019-01-03 ENCOUNTER — Emergency Department (HOSPITAL_COMMUNITY)
Admission: EM | Admit: 2019-01-03 | Discharge: 2019-01-04 | Disposition: A | Discharge status: Home / Self Care | Payer: Self-pay | Attending: Emergency Medicine | Admitting: Emergency Medicine

## 2019-01-03 DIAGNOSIS — F17200 Nicotine dependence, unspecified, uncomplicated: Secondary | ICD-10-CM | POA: Insufficient documentation

## 2019-01-03 DIAGNOSIS — F1910 Other psychoactive substance abuse, uncomplicated: Secondary | ICD-10-CM | POA: Insufficient documentation

## 2019-01-03 DIAGNOSIS — J45909 Unspecified asthma, uncomplicated: Secondary | ICD-10-CM | POA: Insufficient documentation

## 2019-01-04 ENCOUNTER — Other Ambulatory Visit: Payer: Self-pay

## 2019-01-04 ENCOUNTER — Encounter (HOSPITAL_COMMUNITY): Payer: Self-pay | Admitting: Emergency Medicine

## 2019-01-04 MED ORDER — SODIUM CHLORIDE 0.9 % IV BOLUS (SEPSIS)
2000.0000 mL | Freq: Once | INTRAVENOUS | Status: AC
  Administered 2019-01-04: 2000 mL via INTRAVENOUS

## 2019-01-04 MED ORDER — NALOXONE HCL 4 MG/0.1ML NA LIQD: NASAL | 1 refills | Status: DC

## 2019-01-04 NOTE — ED Notes (Signed)
Pt alert and oriented. Somewhat steady on his feet when ambulating

## 2019-01-04 NOTE — ED Triage Notes (Signed)
Pt brought to ED by [facility] found siting on curb nodding off to sleep easily awaked and alert x3 once awake but having difficulty remaining awake. Brought to ED for his own safety to prevent walking into traffic d/t intoxication

## 2019-01-04 NOTE — ED Provider Notes (Signed)
[facility]-EMERGENCY DEPT Provider Note   CSN: [id number] Arrival date & time: 01/03/19  2321    History   Chief Complaint Chief Complaint  Patient presents with  . Addiction Problem    HPI Clifford Clark is a 28 y.o. male.      Mental Health Problem  Presenting symptoms: no suicidal thoughts   Degree of incapacity (severity):  Moderate Timing:  Constant Progression:  Unchanged Context: drug abuse   Relieved by:  Nothing Worsened by:  Nothing Patient presents for presumed substance abuse.  He was found sitting near a gas station falling asleep.  It was assumed that he had been using drugs.  He did not require Narcan. Patient admits to heroin and benzo use.  Denies alcohol abuse. He has no other acute complaints  Past Medical History:  Diagnosis Date  . Asthma   . Opiate abuse, continuous (HCC)    Heroin    There are no active problems to display for this patient.   History reviewed. No pertinent surgical history.      Home Medications    Prior to Admission medications   Medication Sig Start Date End Date Taking? Authorizing Provider  cloNIDine (CATAPRES) 0.1 MG tablet Take 1 tablet (0.1 mg total) by mouth every 6 (six) hours as needed (withdrawl symptoms). 03/01/18   Azalia Bilis, MD  naloxone [facility]) nasal spray 4 mg/0.1 mL Use as needed for heroin overdose 03/01/18   Azalia Bilis, MD  promethazine (PHENERGAN) 25 MG suppository Place 1 suppository (25 mg total) rectally every 6 (six) hours as needed for nausea or vomiting. 03/01/18   Azalia Bilis, MD  zolpidem (AMBIEN) 5 MG tablet Take 1 tablet (5 mg total) by mouth at bedtime as needed for sleep. 03/01/18   Azalia Bilis, MD    Family History History reviewed. No pertinent family history.  Social History Social History   Tobacco Use  . Smoking status: Current Some Day Smoker    Packs/day: 0.50  . Smokeless tobacco: Never Used  Substance Use Topics  . Alcohol use: Yes   Comment: rarely  . Drug use: Yes    Types: Marijuana, IV    Comment: Heroin     Allergies   Patient has no known allergies.   Review of Systems Review of Systems  Constitutional: Negative for fever.  Psychiatric/Behavioral: Negative for suicidal ideas.  All other systems reviewed and are negative.    Physical Exam Updated Vital Signs BP (!) 91/58 (BP Location: Right Arm)   Pulse (!) 108   Temp 98.6 F (37 C) (Oral)   Resp 15   SpO2 97%   Physical Exam CONSTITUTIONAL: Disheveled, sleeping no acute distress HEAD: Normocephalic/atraumatic EYES: EOMI/PERRL ENMT: Mucous membranes moist NECK: supple no meningeal signs SPINE/BACK:entire spine nontender CV: S1/S2 noted, no murmurs/rubs/gallops noted LUNGS: Lungs are clear to auscultation bilaterally, no apparent distress ABDOMEN: soft, nontender NEURO: Pt is sleeping but easily arousable.  Answers all questions appropriately.  No focal weakness is noted. EXTREMITIES: pulses normal/equal, full ROM, no visible trauma SKIN: warm, color normal PSYCH: no abnormalities of mood noted, alert and oriented to situation   ED Treatments / Results  Labs (all labs ordered are listed, but only abnormal results are displayed) Labs Reviewed - No data to display  EKG None  Radiology No results found.  Procedures Procedures (including critical care time)  Medications Ordered in ED Medications  sodium chloride 0.9 % bolus 2,000 mL (0 mLs Intravenous Stopped 01/04/19 0339)  Initial Impression / Assessment and Plan / ED Course  I have reviewed the triage vital signs and the nursing notes.      1:02 AM Presents after being found intoxicated near a gas station.  He is in no acute distress. He was seen on March 5 at an outside hospital for substance abuse he was given a prescription for Narcan We will continue to monitor this time. 3:58 AM Patient had continued somnolence and mild hypotension.  He was given IV fluids and  he is now improved. Vital signs improved Patient requesting discharge. He was given a prescription for Narcan Final Clinical Impressions(s) / ED Diagnoses   Final diagnoses:  Polysubstance abuse [facility])    ED Discharge Orders         Ordered    naloxone [facility]) nasal spray 4 mg/0.1 mL     01/04/19 0351           Zadie Rhine, MD 01/04/19 [phone]

## 2019-01-04 NOTE — Discharge Instructions (Addendum)
Substance Abuse Treatment Programs ° °Intensive Outpatient Programs °[facility]     °[address]      °[place], [place]                   °[phone]      ° °[facility] °[address] °[place], [place] °[phone] ° °[facility]     °(Inpatient and outpatient)     °[address]           °[phone]   ° °[facility] °[phone] (Suboxone and Methadone) ° °[address]      °[place], [place] [address]      °336-882-2125      ° °[address] °[place], [place] °852-3033 ° °[facility] (Outpatient/Inpatient, Chemical)    °(insurance only) [phone]      °       °[facility] (Groups & Residential) °[place], [place] °[phone] ° °   °[facility]     °[address]     °[place], [place]      °[phone]      ° °[facility] (for caregivers and family) °[address] °[place], [place] °336-299-4655 ° ° ° ° ° °Residential Treatment Programs °[facility]      °[address], [place], [place] [address]  °[phone]      ° °[facility] °[address] [place], [place] [address] °[phone] ° °[facility]        °336-248-8914      ° °[facility] °1-800-659-3381 ° °[facility]             °[address]                                         °[place], [place]                                                °877-615-2722 or 336-784-9470                              ° °[facility] [place] °[address] °[place], [address] °1.877.941.8954 ° °[facility]    °[address]      °[place], [place]     °[phone]      ° °[facility] °[address] °[place], [place] °336-285-9073 ° °[facility]   °[address]     °[place], [place] [address]     °[phone]      °Admissions: 8am-3pm M-F ° °[facility] °[address] °[place],  [place] °336-227-7417 ° °[facility]: Residential Program (90 Days)   °[place], [place]      °336-725-8389 or 800-758-6077    ° °[facility] °[place],  °(Walk in Hours over the weekend or by referral) ° °[facility] °[address], [place],  [address] °([phone] ° °Crisis Mobile: [facility]:  1-877-626-1772 (for crisis response 24 hours a day) °[facility] Hotline:      1-800-256-2452 °Outpatient Psychiatry and Counseling ° °[facility]: Mobile Crisis   Management 24 hours:  1-877-626-1772 ° °[facility] sliding scale fee and walk in schedule: M-F 8am-12pm/1pm-3pm °[address]  °[place], [place] [address] °[phone] ° °[facility] °[address] °[place], [place] [address] °336-703-9650 ° °[facility] (Formerly known [facility])- new patient walk-in appointments available Monday - Friday 8am -3pm.          °[address] °[place], [place] [address] °336-676-6840 or crisis line- 336-676-6905 ° °[facility]/ Intensive Outpatient Therapy Program °[address] °[place], [place] [address] °336-832-9804 ° °[facility]                  °Crisis Services      °[phone]      °[address]     °[place], [place] [address]                ° °[facility]   °[facility] °[phone] °[address] °[place], [place] [address] ° ° °[facility]          °[address],  °[place], [place] [address]       °([phone] ° °[facility] °[address] °[place], [place] [address] °336-292-1510 ° °[facility]    °[address]    °[place], [place] [address]     °[phone]      ° °Parish McKinney, MD     °[address]     °[place] [address]     °336-282-1251     °  °[facility] °[address] °[place] [address] ° °[facility]     °[address]     °[place], [place]      °336-542-2076      ° °[facility] °Shamsher Ahluwalia, MD °[address] °[place], [place] [address] °336-420-9558 ° °[facility]     °[address]     °[place], [place] [address]     °336-274-1237      ° °[facility] °[address] °[place], [place] [address] °336-854-4450 °Resources for Temporary Residential Assistance/Crisis Centers ° °DAY CENTERS °[facility] °M-F 8am-3pm   °[address] [place], [place] [address]   336-332-0824 °Services include: laundry, barbering, support groups, case management, phone  & computer access, showers, AA/NA mtgs, mental health/substance abuse nurse, job skills class, disability information, VA assistance, spiritual classes, etc.  ° °HOMELESS SHELTERS ° °[facility]     °[facility]   °[address], [place]     °336.271.5959       °       °[facility] (women and children)       °[address] °[place], [place] [address] °336-275-0820 °[email or web address] for application and process °Application Required ° °[facility]   °[address]    °[place] [address]     °[phone]       °             °[facility] °[address] °, [place] [address] °336.273.5572 °336-235-0363(schedule application appt.) °Application Required ° °[facility] (women only)    °[address]     °[place], [place] [address]     °336-884-1039      °  Intake starts 6pm daily °Need valid ID, SSC, & Police report °[facility] [place] °[address] °[place], [place] °336-881-5420 °Application Required ° °[facility] (men only)     °[address]      °[place], [place]     °[phone]      ° °[facility] °(Pregnant women only) °[address] °[place], [place] °336-275-0206 ° °[facility]      °[address]      °[place], [place] [address]     °336-722-9951      °       °[facility] °[address] °[place], [place] °336-723-1848 °90 day commitment/SA/Application process ° °[facility](men only)     °[address]     °[place], [place]     °[phone]       °Check-in at 7pm     °       °[facility] °[address] °[place], [place] [address] °336-248-6684 °Men/Women/Women and Children must be there by 7 pm ° °[facility] °[place], [place] °336-722-8721                ° °

## 2019-01-04 NOTE — ED Notes (Signed)
Pt ambulated to bathroom well. Alert and oriented. No acute distress

## 2019-01-24 ENCOUNTER — Other Ambulatory Visit: Payer: Self-pay

## 2019-01-24 ENCOUNTER — Inpatient Hospital Stay (HOSPITAL_COMMUNITY)
Admission: AD | Admit: 2019-01-24 | Discharge: 2019-01-28 | DRG: 885 | Disposition: A | Discharge status: Home / Self Care | Payer: No Typology Code available for payment source | Source: Intra-hospital | Attending: Psychiatry | Admitting: Psychiatry

## 2019-01-24 ENCOUNTER — Other Ambulatory Visit: Payer: Self-pay | Admitting: Family

## 2019-01-24 ENCOUNTER — Encounter (HOSPITAL_COMMUNITY): Payer: Self-pay

## 2019-01-24 ENCOUNTER — Emergency Department (HOSPITAL_COMMUNITY)
Admission: EM | Admit: 2019-01-24 | Discharge: 2019-01-24 | Disposition: A | Discharge status: Other Acute Inpatient Hospital | Payer: Self-pay | Attending: Emergency Medicine | Admitting: Emergency Medicine

## 2019-01-24 ENCOUNTER — Encounter (HOSPITAL_COMMUNITY): Payer: Self-pay | Admitting: *Deleted

## 2019-01-24 DIAGNOSIS — F1110 Opioid abuse, uncomplicated: Secondary | ICD-10-CM | POA: Insufficient documentation

## 2019-01-24 DIAGNOSIS — R45851 Suicidal ideations: Secondary | ICD-10-CM | POA: Diagnosis present

## 2019-01-24 DIAGNOSIS — F419 Anxiety disorder, unspecified: Secondary | ICD-10-CM | POA: Diagnosis present

## 2019-01-24 DIAGNOSIS — F329 Major depressive disorder, single episode, unspecified: Secondary | ICD-10-CM | POA: Insufficient documentation

## 2019-01-24 DIAGNOSIS — F17210 Nicotine dependence, cigarettes, uncomplicated: Secondary | ICD-10-CM | POA: Diagnosis present

## 2019-01-24 DIAGNOSIS — F1123 Opioid dependence with withdrawal: Secondary | ICD-10-CM | POA: Diagnosis present

## 2019-01-24 DIAGNOSIS — F1120 Opioid dependence, uncomplicated: Secondary | ICD-10-CM

## 2019-01-24 DIAGNOSIS — F1994 Other psychoactive substance use, unspecified with psychoactive substance-induced mood disorder: Secondary | ICD-10-CM | POA: Diagnosis present

## 2019-01-24 DIAGNOSIS — Z590 Homelessness: Secondary | ICD-10-CM | POA: Insufficient documentation

## 2019-01-24 DIAGNOSIS — F1290 Cannabis use, unspecified, uncomplicated: Secondary | ICD-10-CM | POA: Insufficient documentation

## 2019-01-24 DIAGNOSIS — F333 Major depressive disorder, recurrent, severe with psychotic symptoms: Principal | ICD-10-CM | POA: Diagnosis present

## 2019-01-24 DIAGNOSIS — F32A Depression, unspecified: Secondary | ICD-10-CM

## 2019-01-24 DIAGNOSIS — J45909 Unspecified asthma, uncomplicated: Secondary | ICD-10-CM | POA: Diagnosis present

## 2019-01-24 DIAGNOSIS — F1124 Opioid dependence with opioid-induced mood disorder: Secondary | ICD-10-CM | POA: Diagnosis not present

## 2019-01-24 DIAGNOSIS — F332 Major depressive disorder, recurrent severe without psychotic features: Secondary | ICD-10-CM | POA: Diagnosis present

## 2019-01-24 LAB — COMPREHENSIVE METABOLIC PANEL
ALT: 14 U/L (ref 0–44)
AST: 19 U/L (ref 15–41)
Albumin: 4.4 g/dL (ref 3.5–5.0)
Alkaline Phosphatase: 68 U/L (ref 38–126)
Anion gap: 9 (ref 5–15)
BUN: 8 mg/dL (ref 6–20)
CO2: 28 mmol/L (ref 22–32)
Calcium: 9.6 mg/dL (ref 8.9–10.3)
Chloride: 101 mmol/L (ref 98–111)
Creatinine, Ser: 0.87 mg/dL (ref 0.61–1.24)
GFR calc Af Amer: >60 mL/min (ref >60)
GFR calc non Af Amer: >60 mL/min (ref >60)
Glucose, Bld: 122 mg/dL — ABNORMAL HIGH (ref 70–99)
Potassium: 3.8 mmol/L (ref 3.5–5.1)
Sodium: 138 mmol/L (ref 135–145)
Total Bilirubin: 0.7 mg/dL (ref 0.3–1.2)
Total Protein: 7.4 g/dL (ref 6.5–8.1)

## 2019-01-24 LAB — CBC WITH DIFFERENTIAL/PLATELET
Abs Immature Granulocytes: 0.01 10*3/uL (ref 0.00–0.07)
Basophils Absolute: 0.1 10*3/uL (ref 0.0–0.1)
Basophils Relative: 1 %
Eosinophils Absolute: 0 10*3/uL (ref 0.0–0.5)
Eosinophils Relative: 0 %
HCT: 43.7 % (ref 39.0–52.0)
Hemoglobin: 14 g/dL (ref 13.0–17.0)
Immature Granulocytes: 0 %
Lymphocytes Relative: 15 %
Lymphs Abs: 0.9 10*3/uL (ref 0.7–4.0)
MCH: 27.9 pg (ref 26.0–34.0)
MCHC: 32 g/dL (ref 30.0–36.0)
MCV: 87.2 fL (ref 80.0–100.0)
Monocytes Absolute: 0.4 10*3/uL (ref 0.1–1.0)
Monocytes Relative: 7 %
Neutro Abs: 4.6 10*3/uL (ref 1.7–7.7)
Neutrophils Relative %: 77 %
Platelets: 466 10*3/uL — ABNORMAL HIGH (ref 150–400)
RBC: 5.01 MIL/uL (ref 4.22–5.81)
RDW: 12.8 % (ref 11.5–15.5)
WBC: 6 10*3/uL (ref 4.0–10.5)
nRBC: 0 % (ref 0.0–0.2)

## 2019-01-24 LAB — ETHANOL: Alcohol, Ethyl (B): <10 mg/dL (ref <10)

## 2019-01-24 MED ORDER — HYDROXYZINE HCL 25 MG PO TABS: 25.0000 mg | ORAL_TABLET | Freq: Four times a day (QID) | ORAL | Status: DC | PRN

## 2019-01-24 MED ORDER — LOPERAMIDE HCL 2 MG PO CAPS: 2.0000 mg | ORAL_CAPSULE | ORAL | Status: DC | PRN

## 2019-01-24 MED ORDER — DICYCLOMINE HCL 20 MG PO TABS: 20.0000 mg | ORAL_TABLET | Freq: Four times a day (QID) | ORAL | Status: DC | PRN

## 2019-01-24 MED ORDER — CLONIDINE HCL 0.1 MG PO TABS: 0.1000 mg | ORAL_TABLET | Freq: Four times a day (QID) | ORAL | Status: DC

## 2019-01-24 MED ORDER — METHOCARBAMOL 500 MG PO TABS: 500.0000 mg | ORAL_TABLET | Freq: Three times a day (TID) | ORAL | Status: DC | PRN

## 2019-01-24 MED ORDER — CLONIDINE HCL 0.1 MG PO TABS
0.1000 mg | ORAL_TABLET | Freq: Every day | ORAL | Status: DC
  Filled 2019-01-24: qty 1

## 2019-01-24 MED ORDER — NAPROXEN 500 MG PO TABS: 500.0000 mg | ORAL_TABLET | Freq: Two times a day (BID) | ORAL | Status: DC | PRN

## 2019-01-24 MED ORDER — CLONIDINE HCL 0.1 MG PO TABS: 0.1000 mg | ORAL_TABLET | Freq: Two times a day (BID) | ORAL | Status: DC

## 2019-01-24 MED ORDER — TRAZODONE HCL 50 MG PO TABS: 50.0000 mg | ORAL_TABLET | Freq: Every evening | ORAL | Status: DC | PRN

## 2019-01-24 MED ORDER — CLONIDINE HCL 0.1 MG PO TABS: 0.1000 mg | ORAL_TABLET | Freq: Every day | ORAL | Status: DC

## 2019-01-24 MED ORDER — MAGNESIUM HYDROXIDE 400 MG/5ML PO SUSP: 30.0000 mL | Freq: Every day | ORAL | Status: DC | PRN

## 2019-01-24 MED ORDER — HYDROXYZINE HCL 25 MG PO TABS
25.0000 mg | ORAL_TABLET | Freq: Four times a day (QID) | ORAL | Status: DC | PRN
  Administered 2019-01-25: 16:00:00 25 mg via ORAL
  Filled 2019-01-24 (×3): qty 1

## 2019-01-24 MED ORDER — ONDANSETRON 4 MG PO TBDP: 4.0000 mg | ORAL_TABLET | Freq: Four times a day (QID) | ORAL | Status: DC | PRN

## 2019-01-24 MED ORDER — CLONIDINE HCL 0.1 MG PO TABS
0.1000 mg | ORAL_TABLET | Freq: Four times a day (QID) | ORAL | Status: AC
  Administered 2019-01-25 – 2019-01-26 (×4): 0.1 mg via ORAL
  Filled 2019-01-24 (×10): qty 1

## 2019-01-24 MED ORDER — CLONIDINE HCL 0.1 MG PO TABS
0.1000 mg | ORAL_TABLET | ORAL | Status: DC
  Administered 2019-01-27: 22:00:00 0.1 mg via ORAL
  Filled 2019-01-24 (×4): qty 1

## 2019-01-24 MED ORDER — ENSURE ENLIVE PO LIQD
237.0000 mL | Freq: Two times a day (BID) | ORAL | Status: DC
  Administered 2019-01-27 (×2): 237 mL via ORAL

## 2019-01-24 MED ORDER — NAPROXEN 250 MG PO TABS: 500.0000 mg | ORAL_TABLET | Freq: Two times a day (BID) | ORAL | Status: DC | PRN

## 2019-01-24 MED ORDER — ACETAMINOPHEN 325 MG PO TABS: 650.0000 mg | ORAL_TABLET | Freq: Four times a day (QID) | ORAL | Status: DC | PRN

## 2019-01-24 MED ORDER — ALUM & MAG HYDROXIDE-SIMETH 200-200-20 MG/5ML PO SUSP: 30.0000 mL | ORAL | Status: DC | PRN

## 2019-01-24 NOTE — ED Provider Notes (Signed)
[facility] EMERGENCY DEPARTMENT Provider Note   CSN: [id number] Arrival date & time: 01/24/19  1359    History   Chief Complaint Chief Complaint  Patient presents with  . Suicidal    HPI Clifford Clark is a 28 y.o. male.     HPI   28 year old male with depression and suicidal thoughts.  History of opiate abuse.  Most recently homeless for thr last couple days.  He has had increasing thoughts of harming himself because of his housing situation and general feeling of worthlessness.  Plan was to get a large bag of heroin and then find a place where nobody could easily find him.  Denies any past history of suicidal thoughts.  He has no previous attempts.  Denies any homicidal ideation.  No hallucinations.  He last used heroin this morning.  Past Medical History:  Diagnosis Date  . Asthma   . Opiate abuse, continuous (HCC)    Heroin    There are no active problems to display for this patient.   History reviewed. No pertinent surgical history.      Home Medications    Prior to Admission medications   Medication Sig Start Date End Date Taking? Authorizing Provider  cloNIDine (CATAPRES) 0.1 MG tablet Take 1 tablet (0.1 mg total) by mouth every 6 (six) hours as needed (withdrawl symptoms). Patient not taking: Reported on 01/04/2019 03/01/18   Azalia Bilis, MD  naloxone [facility]) nasal spray 4 mg/0.1 mL Use as needed for heroin overdose 01/04/19   Zadie Rhine, MD  promethazine (PHENERGAN) 25 MG suppository Place 1 suppository (25 mg total) rectally every 6 (six) hours as needed for nausea or vomiting. Patient not taking: Reported on 01/04/2019 03/01/18   Azalia Bilis, MD  zolpidem (AMBIEN) 5 MG tablet Take 1 tablet (5 mg total) by mouth at bedtime as needed for sleep. Patient not taking: Reported on 01/04/2019 03/01/18   Azalia Bilis, MD    Family History History reviewed. No pertinent family history.  Social History Social History   Tobacco Use  .  Smoking status: Current Some Day Smoker    Packs/day: 0.50  . Smokeless tobacco: Never Used  Substance Use Topics  . Alcohol use: Yes    Comment: rarely  . Drug use: Yes    Types: Marijuana, IV    Comment: Heroin     Allergies   Patient has no known allergies.   Review of Systems Review of Systems  All systems reviewed and negative, other than as noted in HPI.  Physical Exam Updated Vital Signs BP 114/73 (BP Location: Right Arm)   Pulse (!) 111   Temp 98.1 F (36.7 C) (Oral)   Ht 5\' 7"  (1.702 m)   Wt 59 kg   SpO2 95%   BMI 20.36 kg/m   Physical Exam Vitals signs and nursing note reviewed.  Constitutional:      General: He is not in acute distress.    Appearance: He is well-developed.  HENT:     Head: Normocephalic and atraumatic.  Eyes:     General:        Right eye: No discharge.        Left eye: No discharge.     Conjunctiva/sclera: Conjunctivae normal.  Neck:     Musculoskeletal: Neck supple.  Cardiovascular:     Rate and Rhythm: Normal rate and regular rhythm.     Heart sounds: Normal heart sounds. No murmur. No friction rub. No gallop.   Pulmonary:  Effort: Pulmonary effort is normal. No respiratory distress.     Breath sounds: Normal breath sounds.  Abdominal:     General: There is no distension.     Palpations: Abdomen is soft.     Tenderness: There is no abdominal tenderness.  Musculoskeletal:        General: No tenderness.  Skin:    General: Skin is warm and dry.  Neurological:     Mental Status: He is alert.  Psychiatric:        Behavior: Behavior normal.        Thought Content: Thought content normal.     Comments: Calm.  Cooperative.  Flat affect.  Poor insight poor eye contact.      ED Treatments / Results  Labs (all labs ordered are listed, but only abnormal results are displayed) Labs Reviewed  COMPREHENSIVE METABOLIC PANEL - Abnormal; Notable for the following components:      Result Value   Glucose, Bld 122 (*)    All  other components within normal limits  CBC WITH DIFFERENTIAL/PLATELET - Abnormal; Notable for the following components:   Platelets 466 (*)    All other components within normal limits  ETHANOL  RAPID URINE DRUG SCREEN, HOSP PERFORMED    EKG None  Radiology No results found.  Procedures Procedures (including critical care time)  Medications Ordered in ED Medications  cloNIDine (CATAPRES) tablet 0.1 mg (has no administration in time range)    Followed by  cloNIDine (CATAPRES) tablet 0.1 mg (has no administration in time range)    Followed by  cloNIDine (CATAPRES) tablet 0.1 mg (has no administration in time range)  dicyclomine (BENTYL) tablet 20 mg (has no administration in time range)  hydrOXYzine (ATARAX/VISTARIL) tablet 25 mg (has no administration in time range)  loperamide (IMODIUM) capsule 2-4 mg (has no administration in time range)  methocarbamol (ROBAXIN) tablet 500 mg (has no administration in time range)  naproxen (NAPROSYN) tablet 500 mg (has no administration in time range)  ondansetron (ZOFRAN-ODT) disintegrating tablet 4 mg (has no administration in time range)     Initial Impression / Assessment and Plan / ED Course  I have reviewed the triage vital signs and the nursing notes.  Pertinent labs & imaging results that were available during my care of the patient were reviewed by me and considered in my medical decision making (see chart for details).  28 year old male with increased depression with feelings of hopelessness and suicidal thoughts.  Will medically clear for TTS evaluation.    Medically cleared.   Final Clinical Impressions(s) / ED Diagnoses   Final diagnoses:  Suicidal thoughts  Depression, unspecified depression type  Opiate abuse, continuous [facility])    ED Discharge Orders    None       Raeford Razor, MD 01/24/19 [phone]

## 2019-01-24 NOTE — ED Triage Notes (Signed)
PT feels SI with out plan. Pt uses heroin daily and last used this AM IV method . Pt A/O and speaking in full sentences.

## 2019-01-24 NOTE — Tx Team (Signed)
Initial Treatment Plan 01/24/2019 7:13 PM Stetson Denke XMD:[id number]    PATIENT STRESSORS: Loss of housing Substance abuse   PATIENT STRENGTHS: Average or above average intelligence Capable of independent living Communication skills Physical Health   PATIENT IDENTIFIED PROBLEMS:       " I want to change my life"      " I'm ready to get clean this time"     "doing heroin for 7 years"     DISCHARGE CRITERIA:  Improved stabilization in mood, thinking, and/or behavior Motivation to continue treatment in a less acute level of care Reduction of life-threatening or endangering symptoms to within safe limits Withdrawal symptoms are absent or subacute and managed without 24-hour nursing intervention  PRELIMINARY DISCHARGE PLAN: Outpatient therapy Placement in alternative living arrangements Return to previous living arrangement  PATIENT/FAMILY INVOLVEMENT: This treatment plan has been presented to and reviewed with the patient, Clifford Clark  The patient has  been given the opportunity to ask questions and make suggestions.  Shela Nevin, RN 01/24/2019, 7:13 PM

## 2019-01-24 NOTE — Progress Notes (Signed)
Patient is a 28 year old male who presented to [facility] voluntarily with complaints of SI x 2 days- having a plan to OD on heroin. Pt reports having used heroin for 7 years, approx 1 gram a day (Last used earlier today). Pt reports having been clean for 11 months in 2017, and states, "I'm ready to get clean for good this time". Pt reports having lost over 30 lbs since January from using everyday. Pt was polite,calm and cooperative, answered questions logically and coherently throughout admission interview. Pt currently denies pain, SI/HI and A/V hallucinations. VS obtained. Skin assessment revealed track marks bilateral forearms - Belongings searched and secured in locker- Admission paperwork completed and signed- Verbal understanding expressed- Q 15 min checks initiated for safety- Patient oriented to unit. Pt provided with toiletries, dinner and soda.

## 2019-01-24 NOTE — BH Assessment (Signed)
Tele Assessment Note   Patient Name: Clifford Clark MRN: [id number] Referring Physician: Juleen China Location of Patient: [facility] ED Location of Provider: Behavioral Health TTS Department  Clifford Clark is an 28 y.o. male presenting voluntarily to [facility] ED complaining of suicidal ideation with a plan to overdose on heroin. Patient reports he has struggled with his opiate addiction since 28. He reports initially using prescription pills and it progressed to heroin use. He reports using .5 to 1 gram of heroin daily. Patient denies any history of mental illness. Patient states accessed the ED today because for the first time he has been experiencing suicidal ideation, wanting to overdose. He expresses frustration about his addiction as he has been to treatment twice, at [facility] and [facility], and at one point had 11 months of sobriety in 2017. Patient reports his suicidal thoughts started approximately 1 week ago after losing his job and housing. Patient denies HI/AVH. Patient denies any other substance use. Patient reports having current charges for paraphernalia and breaking and entering. His court date is 02/18/2019. Patient denies any access to weapons. He identifies his mother, Clifford Clark, as a primary support and gave consent for Clifford Clark to contact if needed,  Patient is alert and oriented x 4. He is dressed in scrubs, laying in bed. He speech is soft, thoughts are organized, and eye contact is fair. His mood is depressed, affect is congruent. Patient's insight, judgement, and impulse control are impaired. Patient does not appear to be responding to internal stimuli or experiencing delusional thought content.  Diagnosis: F32.2 MDD, single episode, severe   F11.20 Opioid use disorder, severe  Past Medical History:  Past Medical History:  Diagnosis Date  . Asthma   . Opiate abuse, continuous (HCC)    Heroin    History reviewed. No pertinent surgical history.  Family History: History reviewed. No pertinent  family history.  Social History:  reports that he has been smoking. He has been smoking about 0.50 packs per day. He has never used smokeless tobacco. He reports current alcohol use. He reports current drug use. Drugs: Marijuana and IV.  Additional Social History:  Alcohol / Drug Use Pain Medications: see MAR Prescriptions: see MAR Over the Counter: see MAR History of alcohol / drug use?: Yes Longest period of sobriety (when/how long): 11 months Substance #1 Name of Substance 1: heroin 1 - Age of First Use: 19 1 - Amount (size/oz): 1 gram 1 - Frequency: daily 1 - Duration: 8 years with several periods of sobriety 1 - Last Use / Amount: 01/24/2019 AM  CIWA: CIWA-Ar BP: 114/73 Pulse Rate: (!) 111 COWS:    Allergies: No Known Allergies  Home Medications: (Not in a hospital admission)   OB/GYN Status:  No LMP for male patient.  General Assessment Data Assessment unable to be completed: Yes Reason for not completing assessment: multiple assessments at one time Location of Assessment: [facility] ED TTS Assessment: In system Is this a Tele or Face-to-Face Assessment?: Tele Assessment Is this an Initial Assessment or a Re-assessment for this encounter?: Initial Assessment Patient Accompanied by:: N/A Language Other than English: No Living Arrangements: Homeless/Shelter What gender do you identify as?: Male Marital status: Single Maiden name: Zeidman Pregnancy Status: No Living Arrangements: Spouse/significant other Can pt return to current living arrangement?: Yes Admission Status: Voluntary Is patient capable of signing voluntary admission?: Yes Referral Source: Self/Family/Friend Insurance type: None     Crisis Care Plan Living Arrangements: Spouse/significant other Legal Guardian: (self) Name of Psychiatrist: none Name of  Therapist: none  Education Status Is patient currently in school?: No Is the patient employed, unemployed or receiving disability?: Unemployed  Risk  to self with the past 6 months Suicidal Ideation: Yes-Currently Present Has patient been a risk to self within the past 6 months prior to admission? : No Suicidal Intent: Yes-Currently Present Has patient had any suicidal intent within the past 6 months prior to admission? : No Is patient at risk for suicide?: Yes Suicidal Plan?: Yes-Currently Present Has patient had any suicidal plan within the past 6 months prior to admission? : No Specify Current Suicidal Plan: overdose Access to Means: Yes Specify Access to Suicidal Means: heroin What has been your use of drugs/alcohol within the last 12 months?: daily heroin use Previous Attempts/Gestures: No How many times?: 0 Other Self Harm Risks: none noted Triggers for Past Attempts: None known Intentional Self Injurious Behavior: None Family Suicide History: No Recent stressful life event(s): Job Loss, Financial Problems Persecutory voices/beliefs?: No Depression: Yes Depression Symptoms: Despondent, Tearfulness, Isolating, Fatigue, Guilt, Loss of interest in usual pleasures, Feeling worthless/self pity, Feeling angry/irritable Substance abuse history and/or treatment for substance abuse?: Yes Suicide prevention information given to non-admitted patients: Not applicable  Risk to Others within the past 6 months Homicidal Ideation: No Does patient have any lifetime risk of violence toward others beyond the six months prior to admission? : No Thoughts of Harm to Others: No Current Homicidal Intent: No Current Homicidal Plan: No Access to Homicidal Means: No Identified Victim: none History of harm to others?: No Assessment of Violence: None Noted Violent Behavior Description: none Does patient have access to weapons?: No Criminal Charges Pending?: Yes Describe Pending Criminal Charges: paraphenalia, breaking and entering Does patient have a court date: Yes Court Date: 02/18/19 Is patient on probation?: No  Psychosis Hallucinations:  None noted Delusions: None noted  Mental Status Report Appearance/Hygiene: In scrubs Eye Contact: Poor Motor Activity: Unsteady Speech: Soft, Slow Level of Consciousness: Drowsy Mood: Depressed Affect: Depressed Anxiety Level: None Thought Processes: Coherent, Relevant Judgement: Impaired Orientation: Person, Place, Time, Situation Obsessive Compulsive Thoughts/Behaviors: None  Cognitive Functioning Concentration: Normal Memory: Recent Intact, Remote Intact Is patient IDD: No Insight: Fair Impulse Control: Poor Appetite: Fair Have you had any weight changes? : No Change Sleep: No Change Total Hours of Sleep: 6 Vegetative Symptoms: None  ADLScreening [facility] Assessment Services) Patient's cognitive ability adequate to safely complete daily activities?: Yes Patient able to express need for assistance with ADLs?: Yes Independently performs ADLs?: Yes (appropriate for developmental age)  Prior Inpatient Therapy Prior Inpatient Therapy: Yes Prior Therapy Dates: 2014, 2017 Prior Therapy Facilty/Provider(s): [facility], [facility] Reason for Treatment: substance use  Prior Outpatient Therapy Prior Outpatient Therapy: Yes Prior Therapy Dates: 2017 Prior Therapy Facilty/Provider(s): [facility] Reason for Treatment: substance use Does patient have an ACCT team?: No Does patient have Intensive In-House Services?  : No Does patient have [facility] services? : No Does patient have P4CC services?: No  ADL Screening (condition at time of admission) Patient's cognitive ability adequate to safely complete daily activities?: Yes Is the patient deaf or have difficulty hearing?: No Does the patient have difficulty seeing, even when wearing glasses/contacts?: No Does the patient have difficulty concentrating, remembering, or making decisions?: No Patient able to express need for assistance with ADLs?: Yes Does the patient have difficulty dressing or bathing?: No Independently performs ADLs?: Yes  (appropriate for developmental age) Does the patient have difficulty walking or climbing stairs?: No Weakness of Legs: None Weakness of Arms/Hands: None  Home Assistive Devices/Equipment Home Assistive Devices/Equipment: None  Therapy Consults (therapy consults require a physician order) PT Evaluation Needed: No OT Evalulation Needed: No SLP Evaluation Needed: No Abuse/Neglect Assessment (Assessment to be complete while patient is alone) Abuse/Neglect Assessment Can Be Completed: Yes Physical Abuse: Denies Verbal Abuse: Denies Sexual Abuse: Denies Exploitation of patient/patient's resources: Denies Self-Neglect: Denies Values / Beliefs Cultural Requests During Hospitalization: None Spiritual Requests During Hospitalization: None Consults Spiritual Care Consult Needed: No Social Work Consult Needed: No Merchant navy officer (For Healthcare) Does Patient Have a Medical Advance Directive?: Yes, No Does patient want to make changes to medical advance directive?: No - Patient declined Type of Advance Directive: Healthcare Power of Attorney Would patient like information on creating a medical advance directive?: No - Patient declined          Disposition: Per Hillery Jacks, NP patient meets in patient criteria. Disposition Initial Assessment Completed for this Encounter: Yes  This service was provided via telemedicine using a 2-way, interactive audio and video technology.  Names of all persons participating in this telemedicine service and their role in this encounter. Name: Seger Jani Role: patient  Name: Celedonio Miyamoto, [place] Role: TTS  Name:  Role:   Name:  Role:     Celedonio Miyamoto 01/24/2019 4:35 PM

## 2019-01-24 NOTE — Progress Notes (Signed)
Per Berneice Heinrich, [facility], pt has been accepted to [facility] bed 303-1. Accepting provider is Hillery Jacks, NP. Attending provider is Dr. Jama Flavors, MD. Patient can arrive after getting a new set of vital signs. Number for report is [phone]. CSW spoke with Magda Paganini, RN regarding disposition.   Clifford Clark. Clifford Clark, MSW, LCSW Clinical Social Work/Disposition Phone: [phone] Fax: [phone]

## 2019-01-25 DIAGNOSIS — F1124 Opioid dependence with opioid-induced mood disorder: Secondary | ICD-10-CM

## 2019-01-25 MED ORDER — NICOTINE 21 MG/24HR TD PT24
21.0000 mg | MEDICATED_PATCH | Freq: Every day | TRANSDERMAL | Status: DC
  Administered 2019-01-25 – 2019-01-27 (×3): 21 mg via TRANSDERMAL
  Filled 2019-01-25 (×5): qty 1

## 2019-01-25 NOTE — H&P (Signed)
Psychiatric Admission Assessment Adult  Patient Identification: Clifford Clark MRN:  [id number] Date of Evaluation:  01/25/2019 Chief Complaint:  " I guess everything came to a head".  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 Opiate Withdrawal. 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 Opiate Withdrawal. History of Present Illness: 28 year old single male, currently homeless. Presented to hospital voluntarily reporting depression, neurovegetative symptoms, passive suicidal ideations.  He reports history of opiate (IV heroin) dependence.  States he has been using 1 g of heroin per day.  States "I am just tired of using, tired of living like this, I want to get better". Today endorses neurovegetative symptoms, mainly poor energy, poor appetite, anhedonia, recent passive SI. He reports current symptoms of opiate withdrawal to include cramps/aches, loose stools, frequent yawning.  Vomited x1 earlier today.  Admission BAL negative.  No admission UDS.    Associated Signs/Symptoms: Depression Symptoms:  depressed mood, anhedonia, insomnia, suicidal thoughts without plan, loss of energy/fatigue, decreased appetite, (Hypo) Manic Symptoms:  None noted or reported Anxiety Symptoms:  Describes some subjective feelings of anxiety which he attributes to withdrawal Psychotic Symptoms:  Denies PTSD Symptoms: Denies PTSD symptoms Total Time spent with patient: 45 minutes  Past Psychiatric History: Patient denies prior psychiatric admissions.  He denies history of suicide attempts.  He denies history of self-injurious behavior such as self cutting.  He denies history of psychosis.  He denies history of mania or bipolarity, he denies history of violence, he denies history of PTSD.  States he has never been on psychiatric medications in the past.  He does endorse a history of depression which he states occurs in the context of drug use  and tends to improve/normalize when he is sober.  Is the patient at risk to self? Yes.    Has the patient been a risk to self in the past 6 months? No.  Has the patient been a risk to self within the distant past? No.  Is the patient a risk to others? No.  Has the patient been a risk to others in the past 6 months? No.  Has the patient been a risk to others within the distant past? No.   Prior Inpatient Therapy:  Denies Prior Outpatient Therapy:  None current  Alcohol Screening:   Substance Abuse History in the last 12 months: He denies alcohol abuse, denies any benzodiazepine abuse, endorses opiate dependence.  States he has been using heroin "on and off" for several years.  Most recently, as above, has been using 1 g of heroin per day (IV) *Longest period of sobriety was 11 months in 2017 through 18 following an inpatient rehab admission Consequences of Substance Abuse: Financial and relationship stress Previous Psychotropic Medications: Reports he has never been on psychiatric medications in the past. Psychological Evaluations: No  Past Medical History: Denies medical illnesses Past Medical History:  Diagnosis Date  . Asthma   . Opiate abuse, continuous (HCC)    Heroin   History reviewed. No pertinent surgical history. Family History: Parents are alive, separated, he has 2 sisters.  He denies history of psychiatric illness and family, denies history of alcohol or substance abuse in family, no suicides in family. Family Psychiatric  History: As above Tobacco Screening:  Smokes half a pack of cigarettes per day Social History: Single, no children, currently unemployed, homeless/was staying with friends prior to admission.  Denies legal issues.  Social History   Substance and Sexual Activity  Alcohol Use Yes   Comment: rarely     Social History   Substance and Sexual Activity  Drug Use Yes  . Types: Marijuana, IV   Comment: Heroin    Additional Social History:  Allergies:   No Known Allergies Lab Results:  Results for orders placed or performed during the hospital encounter of 01/24/19 (from the past 48 hour(s))  Comprehensive metabolic panel     Status: Abnormal   Collection Time: 01/24/19  3:12 PM  Result Value Ref Range   Sodium 138 135 - 145 mmol/L   Potassium 3.8 3.5 - 5.1 mmol/L   Chloride 101 98 - 111 mmol/L   CO2 28 22 - 32 mmol/L   Glucose, Bld 122 (H) 70 - 99 mg/dL   BUN 8 6 - 20 mg/dL   Creatinine, Ser 9.48 0.61 - 1.24 mg/dL   Calcium 9.6 8.9 - 54.6 mg/dL   Total Protein 7.4 6.5 - 8.1 g/dL   Albumin 4.4 3.5 - 5.0 g/dL   AST 19 15 - 41 U/L   ALT 14 0 - 44 U/L   Alkaline Phosphatase 68 38 - 126 U/L   Total Bilirubin 0.7 0.3 - 1.2 mg/dL   GFR calc non Af Amer >60 >60 mL/min   GFR calc Af Amer >60 >60 mL/min   Anion gap 9 5 - 15    Comment: Performed at [facility], [address], [place], [place] [address]  Ethanol     Status: None   Collection Time: 01/24/19  3:12 PM  Result Value Ref Range   Alcohol, Ethyl (B) <10 <10 mg/dL    Comment: (NOTE) Lowest detectable limit for serum alcohol is 10 mg/dL. For medical purposes only. Performed at [facility], [address] [place], [place] [address]   CBC with Diff     Status: Abnormal   Collection Time: 01/24/19  3:12 PM  Result Value Ref Range   WBC 6.0 4.0 - 10.5 K/uL   RBC 5.01 4.22 - 5.81 MIL/uL   Hemoglobin 14.0 13.0 - 17.0 g/dL   HCT 18.2 99.3 - 71.6 %   MCV 87.2 80.0 - 100.0 fL   MCH 27.9 26.0 - 34.0 pg   MCHC 32.0 30.0 - 36.0 g/dL   RDW 96.7 89.3 - 81.0 %   Platelets 466 (H) 150 - 400 K/uL   nRBC 0.0 0.0 - 0.2 %   Neutrophils Relative % 77 %   Neutro Abs 4.6 1.7 - 7.7 K/uL   Lymphocytes Relative 15 %   Lymphs Abs 0.9 0.7 - 4.0 K/uL   Monocytes Relative 7 %   Monocytes Absolute 0.4 0.1 - 1.0 K/uL   Eosinophils Relative 0 %   Eosinophils Absolute 0.0 0.0 - 0.5 K/uL   Basophils Relative 1 %   Basophils Absolute 0.1 0.0 - 0.1 K/uL   Immature Granulocytes 0 %    Abs Immature Granulocytes 0.01 0.00 - 0.07 K/uL    Comment: Performed at [facility], [address] [place], [place] [address]    Blood Alcohol level:  Lab Results  Component Value Date   ETH <10 01/24/2019   ETH <10 02/28/2018    Metabolic Disorder Labs:  No results found for: HGBA1C, MPG No results found for: PROLACTIN No results found for: CHOL, TRIG, HDL, CHOLHDL, VLDL, LDLCALC  Current Medications: Current Facility-Administered Medications  Medication Dose Route Frequency Provider Last Rate Last Dose  . acetaminophen (TYLENOL) tablet 650 mg  650 mg Oral Q6H PRN Oneta Rack, NP      . alum & mag hydroxide-simeth (MAALOX/MYLANTA) 200-200-20 MG/5ML suspension 30 mL  30 mL Oral Q4H PRN Oneta Rack, NP      . cloNIDine (CATAPRES) tablet 0.1 mg  0.1 mg Oral QID Oneta Rack, NP       Followed by  . [START ON 01/27/2019] cloNIDine (CATAPRES) tablet 0.1 mg  0.1 mg Oral Esperanza Heir, NP       Followed by  . [START ON 01/29/2019] cloNIDine (CATAPRES) tablet 0.1 mg  0.1 mg Oral QAC breakfast Oneta Rack, NP      . dicyclomine (BENTYL) tablet 20 mg  20 mg Oral Q6H PRN Oneta Rack, NP      . feeding supplement (ENSURE ENLIVE) (ENSURE ENLIVE) liquid 237 mL  237 mL Oral BID BM , Rockey Situ, MD      . hydrOXYzine (ATARAX/VISTARIL) tablet 25 mg  25 mg Oral Q6H PRN Oneta Rack, NP      . loperamide (IMODIUM) capsule 2-4 mg  2-4 mg Oral PRN Oneta Rack, NP      . magnesium hydroxide (MILK OF MAGNESIA) suspension 30 mL  30 mL Oral Daily PRN Oneta Rack, NP      . methocarbamol (ROBAXIN) tablet 500 mg  500 mg Oral Q8H PRN Oneta Rack, NP      . naproxen (NAPROSYN) tablet 500 mg  500 mg Oral BID PRN Oneta Rack, NP      . ondansetron (ZOFRAN-ODT) disintegrating tablet 4 mg  4 mg Oral Q6H PRN Oneta Rack, NP      . traZODone (DESYREL) tablet 50 mg  50 mg Oral QHS PRN Oneta Rack, NP       PTA Medications: No medications prior  to admission.    Musculoskeletal: Strength & Muscle Tone: within normal limits Gait & Station: normal Patient leans: N/A  Psychiatric Specialty Exam: Physical Exam  Review of Systems  Constitutional: Negative.  Negative for fever.  HENT: Negative.   Eyes: Negative.   Respiratory: Negative.  Negative for cough.   Cardiovascular: Negative.  Negative for chest pain.  Gastrointestinal: Positive for diarrhea, nausea and vomiting.  Genitourinary: Negative.   Musculoskeletal: Positive for myalgias.  Skin: Negative.   Neurological: Negative for seizures and headaches.  Endo/Heme/Allergies: Negative.   Psychiatric/Behavioral: Positive for depression, substance abuse and suicidal ideas.  All other systems reviewed and are negative.   Blood pressure 92/70, pulse 91, temperature 97.6 F (36.4 C), temperature source Oral, resp. rate 18, height 5\' 6"  (1.676 m), weight 57.2 kg, SpO2 100 %.Body mass index is 20.34 kg/m.  General Appearance: Fairly Groomed  Eye Contact:  Fair  Speech:  Normal Rate  Volume:  Normal  Mood:  Depressed  Affect:  Congruent, vaguely irritable  Thought Process:  Linear and Descriptions of Associations: Intact  Orientation:  Full (Time, Place, and Person)  Thought Content:  No hallucinations, no delusions, not internally preoccupied  Suicidal Thoughts:  No currently denies suicidal plan or intention and contracts for safety on unit, also denies any homicidal or violent ideations  Homicidal Thoughts:  No  Memory:  Recent and remote grossly intact  Judgement:  Fair  Insight:  Fair  Psychomotor Activity:  Decreased  Concentration:  Concentration: Fair and Attention Span: Fair  Recall:  [employer] of Knowledge:  Fair  Language:  Fair  Akathisia:  Negative  Handed:  Right  AIMS (if indicated):     Assets:  Desire for Improvement Resilience  ADL's:  Intact  Cognition:  WNL  Sleep:  Number of Hours: 6.75    Treatment Plan Summary: Daily contact with patient  to assess and evaluate symptoms and progress in treatment, Medication management, Plan Inpatient treatment and Medications as below  Observation Level/Precautions:  15 minute checks  Laboratory:  Patient expresses interest in being tested for HIV and viral hepatitis  Psychotherapy: Milieu/group therapy  Medications: We discussed options.  He reports that his mood tends to improve with sobriety/abstinence and that his depression is substance-induced.  We will not start antidepressant medication at this time, will continue to monitor mood.  He is on clonidine detox protocol to manage opiate withdrawal.  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    Craige Cotta, MD 4/5/202011:18 AM

## 2019-01-25 NOTE — Progress Notes (Signed)
NUTRITION ASSESSMENT  Pt identified as at risk on the Malnutrition Screen Tool  INTERVENTION: 1. Supplements: Continue Ensure Enlive po BID, each supplement provides 350 kcal and 20 grams of protein  NUTRITION DIAGNOSIS: Unintentional weight loss related to sub-optimal intake as evidenced by pt report.   Goal: Pt to meet >/= 90% of their estimated nutrition needs.  Monitor:  PO intake  Assessment:  Pt admitted with SI and substance abuse (daily heroin use). Per patient report, pt has lost 30 lb since January 2020. No weight record available from January to confirm. Per weight records, pt weighed ~139 lb in May 2019. Pt has been ordered Ensure supplements, will continue given weight loss.  Height: Ht Readings from Last 1 Encounters:  01/24/19 5\' 6"  (1.676 m)    Weight: Wt Readings from Last 1 Encounters:  01/24/19 57.2 kg    Weight Hx: Wt Readings from Last 10 Encounters:  01/24/19 57.2 kg  01/24/19 59 kg  03/01/18 63.5 kg  02/28/18 63.5 kg  02/28/18 63.5 kg  01/25/13 65.8 kg    BMI:  Body mass index is 20.34 kg/m. Pt meets criteria for normal based on current BMI.  Estimated Nutritional Needs: Kcal: 25-30 kcal/kg Protein: > 1 gram protein/kg Fluid: 1 ml/kcal  Diet Order:  Diet Order    None     Pt is also offered choice of unit snacks mid-morning and mid-afternoon.  Pt is eating as desired.   Lab results and medications reviewed.   Tilda Franco, MS, RD, LDN [facility] Inpatient Clinical Dietitian Pager: [phone] After Hours Pager: [phone]
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
## 2019-01-25 NOTE — [facility] Suicide Risk Assessment (Signed)
[facility] INPATIENT:  Family/Significant Other Suicide Prevention Education  Suicide Prevention Education:  Patient Refusal for Family/Significant Other Suicide Prevention Education: The patient Clifford Clark has refused to provide written consent for family/significant other to be provided Family/Significant Other Suicide Prevention Education during admission and/or prior to discharge.  Physician notified.  SPE completed with pt, as pt refused to consent to family contact. SPI pamphlet provided to pt and pt was encouraged to share information with support network, ask questions, and talk about any concerns relating to SPE. Pt denies access to guns/firearms and verbalized understanding of information provided. Mobile Crisis information also provided to pt.   Rona Ravens LCSW 01/25/2019, 1:05 PM

## 2019-01-25 NOTE — Progress Notes (Signed)
[facility] Group Notes:  (Nursing/MHT/Case Management/Adjunct)  Date:  01/25/2019  Time: 2030  Type of Therapy:  wrap up group  Participation Level:  Minimal  Participation Quality:  Appropriate, Attentive and Supportive  Affect:  Lethargic  Cognitive:  Appropriate  Insight:  Improving  Engagement in Group:  Supportive  Modes of Intervention:  Clarification, Education and Support  Summary of Progress/Problems: Pt shared that he was feeling a little better physically and emotionally. If pt could change any one thing about his life he would change his upbringing. Pt reports being grateful for being alive.   Marcille Buffy 01/25/2019, 9:51 PM

## 2019-01-25 NOTE — Progress Notes (Signed)
D: Pt was in bed in his room upon initial approach.  Pt presents with depressed affect and mood.  He reports he is "still sleepy."  Forwards little information.  Pt denies SI/HI, denies hallucinations, reports generalized pain of 6/10.  He complains of nausea.  Pt has been in his room for the majority of the evening.  A: Introduced self to pt.  Met with pt 1:1.  Actively listened to pt and offered support and encouragement. PRN medication offered for pain and nausea, pt declined.  Scheduled clonidine was held because pt was hypotensive.  PO fluids encouraged and pt provided with pitcher of Gatorade.  Q15 minute safety checks maintained.  R: Pt is safe on the unit.  Pt verbally contracts for safety.  Will continue to monitor and assess.

## 2019-01-25 NOTE — Progress Notes (Signed)
D: Patient alert and oriented. Affect/mood: Anxious depressed. Patient endorses feelings of depression, passive suicidal thoughts, though HI, AVH at this time. Denies pain. Patients biggest identified stressor is his ongoing struggle to refrain from drug use. Morning clonidine medication was held due to low BP and pulse (see flow sheets), though afternoon dose was given. Clonidine protocol remains in place at this time.   A: Scheduled medications administered to patient per MD order. Support and encouragement provided. Routine safety checks conducted every 15 minutes. Patient informed to notify staff with problems or concerns.  R: No adverse drug reactions noted. Patient contracts for safety at this time. Remains safe at this time. Will continue to monitor.   Patient at this time is refusing his ekg test, stating that he wants to try to have it done later. Expresses complaints of anxiety. PRN vistaril given. Will reassess for relief.

## 2019-01-25 NOTE — [facility] Group Notes (Signed)
[facility] LCSW Group Therapy Note  Date/Time:  01/25/2019 9:00-10:00 or 10:00-11:00AM  Type of Therapy and Topic:  Group Therapy:  Healthy and Unhealthy Supports  Participation Level:  Did Not Attend   Description of Group:  Patients in this group were introduced to the idea of adding a variety of healthy supports to address the various needs in their lives.Patients discussed what additional healthy supports could be helpful in their recovery and wellness after discharge in order to prevent future hospitalizations.   An emphasis was placed on using counselor, doctor, therapy groups, 12-step groups, and problem-specific support groups to expand supports.  They also worked as a group on developing a specific plan for several patients to deal with unhealthy supports through boundary-setting, psychoeducation with loved ones, and even termination of relationships.   Therapeutic Goals:   1)  discuss importance of adding supports to stay well once out of the hospital  2)  compare healthy versus unhealthy supports and identify some examples of each  3)  generate ideas and descriptions of healthy supports that can be added  4)  offer mutual support about how to address unhealthy supports  5)  encourage active participation in and adherence to discharge plan    Summary of Patient Progress:  Did not attend  Therapeutic Modalities:   Motivational Interviewing Brief Solution-Focused Therapy  Evorn Gong

## 2019-01-25 NOTE — B[facility] Counselor (Signed)
Adult Comprehensive Assessment  Patient ID: Clifford Clark, male   DOB: September 11, 1991, 28 y.o.   MRN: [id number]  Information Source: Information source: Patient  Current Stressors:  Patient states their primary concerns and needs for treatment are:: heroin abuse 1g daily IV; homeless; unemployed; limited social supports; ongoing SI with plan to OD on heroin Patient states their goals for this hospitilization and ongoing recovery are:: "to get off heroin and get into treatment."  Educational / Learning stressors: high school Employment / Job issues: unemployed "for awhile now."  Family Relationships: mother is supportive; father is somewhat supportive. two sisters-don't talk much. no family history of substance abuse or mental illness per pt Financial / Lack of resources (include bankruptcy): no income; no Presenter, broadcasting / Lack of housing: homeless "staying with friends on couches." Physical health (include injuries & life threatening diseases): none identified Social relationships: somewhat supportive friends "that use heroin too."  Substance abuse: heroin 1g daily IV use for about seven years. had 11 months of sobriety in 2017 after going to [facility] and [facility] Bereavement / Loss: none identified   Living/Environment/Situation:  Living Arrangements: Non-relatives/Friends Living conditions (as described by patient or guardian): pt identifies as homeless, states that he stays with a friend Who else lives in the home?: friend How long has patient lived in current situation?: few months  What is atmosphere in current home: Temporary  Family History:  Marital status: Single Are you sexually active?: No What is your sexual orientation?: heterosexual Has your sexual activity been affected by drugs, alcohol, medication, or emotional stress?: no.  Does patient have children?: No  Childhood History:  By whom was/is the patient raised?: Both parents Additional childhood history  information: parents were married for most of his childhood. no issues in childhood per pt.  Description of patient's relationship with caregiver when they were a child: close to both parents Patient's description of current relationship with people who raised him/her: close to mother; fair relationship with father. both live locally How were you disciplined when you got in trouble as a child/adolescent?: grounded; whoopings Does patient have siblings?: Yes Number of Siblings: 2 Description of patient's current relationship with siblings: pt is the youngest of three. he has two older siblings. "they don't have any substance abuse issues."  Did patient suffer any verbal/emotional/physical/sexual abuse as a child?: No Did patient suffer from severe childhood neglect?: No Has patient ever been sexually abused/assaulted/raped as an adolescent or adult?: No Was the patient ever a victim of a crime or a disaster?: No Witnessed domestic violence?: No Has patient been effected by domestic violence as an adult?: No  Education:  Highest grade of school patient has completed: high school Currently a Consulting civil engineer?: No Learning disability?: No  Employment/Work Situation:   Employment situation: Unemployed Patient's job has been impacted by current illness: Yes Describe how patient's job has been impacted: ongoing heroin abuse for 7 years has made it difficult for him to find and maintain employment What is the longest time patient has a held a job?: pt declined to say Where was the patient employed at that time?: pt declined to say Did You Receive Any Psychiatric Treatment/Services While in the [employer]?: No(n/a) Are There Guns or Other Weapons in Your Home?: No Are These Weapons Safely Secured?: (n/a)  Financial Resources:   Financial resources: No income Does patient have a Lawyer or guardian?: No  Alcohol/Substance Abuse:   What has been your use of drugs/alcohol within the last 12  months?: daily heroin use 1gram daily IV. no other drug use or alcohol use.  If attempted suicide, did drugs/alcohol play a role in this?: No(pt has been having passive SI with some thoughts to overdose on heroin.) Alcohol/Substance Abuse Treatment Hx: Past Tx, Inpatient If yes, describe treatment: Pt reports that he has been to [facility] and [facility] in 2017 and was able to stay clean for 11 months during that time.  Has alcohol/substance abuse ever caused legal problems?: No("not currently" pt declined to share if he has a legal history specific to drug use or sale)  Social Support System:   Patient's Community Support System: Poor Describe Community Support System: few positive social supports--parents/siblings Type of faith/religion: none How does patient's faith help to cope with current illness?: n/a   Leisure/Recreation:   Leisure and Hobbies: "nothing anymore. I have no motivation."   Strengths/Needs:   What is the patient's perception of their strengths?: "I want to get better."  Patient states they can use these personal strengths during their treatment to contribute to their recovery: "I want to get into a program. It's worked for me before."  Patient states these barriers may affect/interfere with their treatment: no money, no income;  Patient states these barriers may affect their return to the community: nowhere safe to go Other important information patient would like considered in planning for their treatment: worried about relapsing   Discharge Plan:   Currently receiving community mental health services: No Patient states concerns and preferences for aftercare planning are: [facility] for outpatient. would like referral for [facility] and [facility] Patient states they will know when they are safe and ready for discharge when: "when I'm detoxed and feeling better and get into a program."  Does patient have access to transportation?: No(bus) Does patient have  financial barriers related to discharge medications?: Yes Patient description of barriers related to discharge medications: no income; no insurance.  Plan for no access to transportation at discharge: bus/taxi if going to treatment.  Plan for living situation after discharge: pt is hoping for direct admission to [facility] or [facility] Will patient be returning to same living situation after discharge?: No  Summary/Recommendations:   Summary and Recommendations (to be completed by the evaluator): Pt is 27yo male who identifies as homeless in [facility] [place]. He presents to the hospital seeking treatment for SI, depression, heroin abuse (Chronic). Pt reports that he has been using approximately 1gram of heroin daily (IV) for 7 years. He was clean for approximately 11 months in 2017 after attending residential treatment. Pt has a diagnosis of MDD and Opiate use Disorder, severe. He is single, with no children, and is currently unemployed. No identified history of abuse/neglect in childhood. Pt denies SI/HI/AVH at this time. Pt is interested in a referral to [facility] and [facility]; [facility] for outpatient. Recommendations for pt include: crisis stabilization, therapeutic milieu, encourage group attendance and participation, medication management for mood stabilization/detox, and development of comprehensive mental wellness/sobriety plan. CSW assessing for appropriate referrals.   Rona Ravens LCSW 01/25/2019 1:20 PM

## 2019-01-26 DIAGNOSIS — F333 Major depressive disorder, recurrent, severe with psychotic symptoms: Principal | ICD-10-CM

## 2019-01-26 LAB — HEPATIC FUNCTION PANEL
ALT: 16 U/L (ref 0–44)
AST: 17 U/L (ref 15–41)
Albumin: 4.5 g/dL (ref 3.5–5.0)
Alkaline Phosphatase: 71 U/L (ref 38–126)
Bilirubin, Direct: <0.1 mg/dL (ref 0.0–0.2)
Total Bilirubin: 0.4 mg/dL (ref 0.3–1.2)
Total Protein: 7.6 g/dL (ref 6.5–8.1)

## 2019-01-26 LAB — TSH: TSH: 0.126 u[IU]/mL — ABNORMAL LOW (ref 0.350–4.500)

## 2019-01-26 LAB — LIPID PANEL
Cholesterol: 207 mg/dL — ABNORMAL HIGH (ref 0–200)
HDL: 50 mg/dL (ref >40)
LDL Cholesterol: 143 mg/dL — ABNORMAL HIGH (ref 0–99)
Total CHOL/HDL Ratio: 4.1 RATIO
Triglycerides: 70 mg/dL (ref <150)
VLDL: 14 mg/dL (ref 0–40)

## 2019-01-26 LAB — HIV ANTIBODY (ROUTINE TESTING W REFLEX): HIV Screen 4th Generation wRfx: NONREACTIVE

## 2019-01-26 MED ORDER — GABAPENTIN 300 MG PO CAPS
300.0000 mg | ORAL_CAPSULE | Freq: Three times a day (TID) | ORAL | Status: DC
  Administered 2019-01-26 – 2019-01-27 (×5): 300 mg via ORAL
  Filled 2019-01-26 (×10): qty 1

## 2019-01-26 MED ORDER — METHOCARBAMOL 750 MG PO TABS
750.0000 mg | ORAL_TABLET | Freq: Three times a day (TID) | ORAL | Status: DC
  Administered 2019-01-26 – 2019-01-27 (×5): 750 mg via ORAL
  Filled 2019-01-26 (×10): qty 1

## 2019-01-26 NOTE — Tx Team (Signed)
Interdisciplinary Treatment and Diagnostic Plan Update  01/26/2019 Time of Session: 9:00am Clifford Clark MRN: [id number]  Principal Diagnosis: <principal problem not specified>  Secondary Diagnoses: Active Problems:   MDD (major depressive disorder), recurrent, severe, with psychosis ([place])   Current Medications:  Current Facility-Administered Medications  Medication Dose Route Frequency Provider Last Rate Last Dose  . acetaminophen (TYLENOL) tablet 650 mg  650 mg Oral Q6H PRN Derrill Center, NP      . alum & mag hydroxide-simeth (MAALOX/MYLANTA) 200-200-20 MG/5ML suspension 30 mL  30 mL Oral Q4H PRN Derrill Center, NP      . cloNIDine (CATAPRES) tablet 0.1 mg  0.1 mg Oral QID Derrill Center, NP   0.1 mg at 01/25/19 1619   Followed by  . [START ON 01/27/2019] cloNIDine (CATAPRES) tablet 0.1 mg  0.1 mg Oral [name], NP       Followed by  . [START ON 01/29/2019] cloNIDine (CATAPRES) tablet 0.1 mg  0.1 mg Oral QAC breakfast Derrill Center, NP      . dicyclomine (BENTYL) tablet 20 mg  20 mg Oral Q6H PRN Derrill Center, NP      . feeding supplement (ENSURE ENLIVE) (ENSURE ENLIVE) liquid 237 mL  237 mL Oral BID BM [name], [name], MD      . hydrOXYzine (ATARAX/VISTARIL) tablet 25 mg  25 mg Oral Q6H PRN Derrill Center, NP   25 mg at 01/25/19 1621  . loperamide (IMODIUM) capsule 2-4 mg  2-4 mg Oral PRN Derrill Center, NP      . magnesium hydroxide (MILK OF MAGNESIA) suspension 30 mL  30 mL Oral Daily PRN Derrill Center, NP      . methocarbamol (ROBAXIN) tablet 500 mg  500 mg Oral Q8H PRN Derrill Center, NP      . naproxen (NAPROSYN) tablet 500 mg  500 mg Oral BID PRN Derrill Center, NP      . nicotine (NICODERM CQ - dosed in mg/24 hours) patch 21 mg  21 mg Transdermal Daily [name], [name], MD   21 mg at 01/25/19 1153  . ondansetron (ZOFRAN-ODT) disintegrating tablet 4 mg  4 mg Oral Q6H PRN Derrill Center, NP      . traZODone (DESYREL) tablet 50 mg  50 mg Oral QHS PRN  Derrill Center, NP       PTA Medications: Medications Prior to Admission  Medication Sig Dispense Refill Last Dose  . acetaminophen (TYLENOL) 325 MG tablet Take 650 mg by mouth every 6 (six) hours as needed for mild pain or headache.     . ibuprofen (ADVIL,MOTRIN) 400 MG tablet Take 400 mg by mouth every 6 (six) hours as needed for headache or mild pain.       Patient Stressors: Loss of housing Substance abuse  Patient Strengths: Average or above average intelligence Capable of independent living Communication skills Physical Health  Treatment Modalities: Medication Management, Group therapy, Case management,  1 to 1 session with clinician, Psychoeducation, Recreational therapy.   Physician Treatment Plan for Primary Diagnosis: <principal problem not specified> Long Term Goal(s): Improvement in symptoms so as ready for discharge Improvement in symptoms so as ready for discharge   Short Term Goals: Ability to identify triggers associated with substance abuse/mental health issues will improve Ability to identify changes in lifestyle to reduce recurrence of condition will improve Ability to verbalize feelings will improve Ability to disclose and discuss suicidal ideas Ability to demonstrate  self-control will improve Ability to identify and develop effective coping behaviors will improve Ability to maintain clinical measurements within normal limits will improve  Medication Management: Evaluate patient's response, side effects, and tolerance of medication regimen.  Therapeutic Interventions: 1 to 1 sessions, Unit Group sessions and Medication administration.  Evaluation of Outcomes: Not Met  Physician Treatment Plan for Secondary Diagnosis: Active Problems:   MDD (major depressive disorder), recurrent, severe, with psychosis ([place])  Long Term Goal(s): Improvement in symptoms so as ready for discharge Improvement in symptoms so as ready for discharge   Short Term Goals:  Ability to identify triggers associated with substance abuse/mental health issues will improve Ability to identify changes in lifestyle to reduce recurrence of condition will improve Ability to verbalize feelings will improve Ability to disclose and discuss suicidal ideas Ability to demonstrate self-control will improve Ability to identify and develop effective coping behaviors will improve Ability to maintain clinical measurements within normal limits will improve     Medication Management: Evaluate patient's response, side effects, and tolerance of medication regimen.  Therapeutic Interventions: 1 to 1 sessions, Unit Group sessions and Medication administration.  Evaluation of Outcomes: Not Met   RN Treatment Plan for Primary Diagnosis: <principal problem not specified> Long Term Goal(s): Knowledge of disease and therapeutic regimen to maintain health will improve  Short Term Goals: Ability to participate in decision making will improve, Ability to identify and develop effective coping behaviors will improve and Compliance with prescribed medications will improve  Medication Management: RN will administer medications as ordered by provider, will assess and evaluate patient's response and provide education to patient for prescribed medication. RN will report any adverse and/or side effects to prescribing provider.  Therapeutic Interventions: 1 on 1 counseling sessions, Psychoeducation, Medication administration, Evaluate responses to treatment, Monitor vital signs and CBGs as ordered, Perform/monitor CIWA, COWS, AIMS and Fall Risk screenings as ordered, Perform wound care treatments as ordered.  Evaluation of Outcomes: Not Met   LCSW Treatment Plan for Primary Diagnosis: <principal problem not specified> Long Term Goal(s): Safe transition to appropriate next level of care at discharge, Engage patient in therapeutic group addressing interpersonal concerns.  Short Term Goals: Engage  patient in aftercare planning with referrals and resources, Increase social support, Identify triggers associated with mental health/substance abuse issues and Increase skills for wellness and recovery  Therapeutic Interventions: Assess for all discharge needs, 1 to 1 time with Social worker, Explore available resources and support systems, Assess for adequacy in community support network, Educate family and significant other(s) on suicide prevention, Complete Psychosocial Assessment, Interpersonal group therapy.  Evaluation of Outcomes: Not Met   Progress in Treatment: Attending groups: Yes. Participating in groups: Yes. Taking medication as prescribed: Yes. Toleration medication: Yes. Family/Significant other contact made: Yes, individual(s) contacted:  patient declined consents, SPE completed with patient. Patient understands diagnosis: Yes. Discussing patient identified problems/goals with staff: No. Medical problems stabilized or resolved: No. Denies suicidal/homicidal ideation: No. Issues/concerns per patient self-inventory: Yes.  New problem(s) identified: Yes, Describe:  homeless, no income  New Short Term/Long Term Goal(s): detox, medication management for mood stabilization; elimination of SI thoughts; development of comprehensive mental wellness/sobriety plan.  Patient Goals: Wants to go to residential treatment.  Discharge Plan or Barriers: CSW will make referrals to [facility] and [facility] and will assess for other appropriate referrals. [place] pamphlet, Mobile Crisis information, and AA/NA information provided to patient for additional community support and resources.   Reason for Continuation of Hospitalization: Anxiety Depression Suicidal ideation Withdrawal symptoms  Estimated Length of Stay: 3-5 days  Attendees: Patient: Clifford Clark 01/26/2019 9:29 AM  Physician:  01/26/2019 9:29 AM  Nursing:  01/26/2019 9:29 AM  RN Care Manager: 01/26/2019 9:29 AM  Social Worker: Stephanie Acre, [place] 01/26/2019 9:29 AM  Recreational Therapist:  01/26/2019 9:29 AM  Other:  01/26/2019 9:29 AM  Other:  01/26/2019 9:29 AM  Other: 01/26/2019 9:29 AM    Scribe for Treatment Team: Joellen Jersey, [place] 01/26/2019 9:29 AM

## 2019-01-26 NOTE — Plan of Care (Signed)
Patient refused to get out of bed, refused EKG, refused lab work, and refused vitals this morning. Denies SI HI AVH. Patient said "I really don't feel like getting up right now." MD notified. Safety is maintained with 15 minute checks as well as environmental checks. Will continue to monitor.   Problem: Education: Goal: Ability to make informed decisions regarding treatment will improve Outcome: Not Progressing   Problem: Coping: Goal: Coping ability will improve Outcome: Not Progressing   Problem: Health Behavior/Discharge Planning: Goal: Identification of resources available to assist in meeting health care needs will improve Outcome: Not Progressing   Problem: Medication: Goal: Compliance with prescribed medication regimen will improve Outcome: Not Progressing   Problem: Self-Concept: Goal: Ability to disclose and discuss suicidal ideas will improve Outcome: Not Progressing

## 2019-01-26 NOTE — Progress Notes (Signed)
Recreation Therapy Notes  Date:  4.6.20 Time: 0930 Location: 300 Hall Dayroom  Group Topic: Stress Management  Goal Area(s) Addresses:  Patient will identify positive stress management techniques. Patient will identify benefits of using stress management post d/c.  Intervention:  Stress Management  Activity :  Guided Imagery.  LRT introduced the stress management technique of guided imagery.  LRT read a script that guided patients in envisioning their peaceful place.  Patients were to listen and follow along as script was read to engage in activity.    Education:  Stress Management, Discharge Planning.   Education Outcome: Acknowledges Education  Clinical Observations/Feedback:  Pt did not attend group.    Caroll Rancher, LRT/CTRS         Caroll Rancher A 01/26/2019 10:42 AM

## 2019-01-26 NOTE — Progress Notes (Signed)
[facility] MD Progress Note  01/26/2019 10:32 AM Clifford Clark  MRN:  [id number] Subjective:    Patient seen in his bed he is generally less irritable with me than other examiners is complaining of 2 specific withdrawal symptoms cramping in the back and anxiety he denies wanting to harm self or others not participating in groups though.  No cravings at this point no dreams of use No thoughts of harming self or others mood less irritable Principal Problem: Opiate dependency and related withdrawal symptoms unrelated substance-induced mood disorder Diagnosis: Active Problems:   MDD (major depressive disorder), recurrent, severe, with psychosis (HCC)  Total Time spent with patient: 20 minutes   Past Medical History:  Past Medical History:  Diagnosis Date  . Asthma   . Opiate abuse, continuous (HCC)    Heroin   History reviewed. No pertinent surgical history. Family History: History reviewed. No pertinent family history. Social History:  Social History   Substance and Sexual Activity  Alcohol Use Yes   Comment: rarely     Social History   Substance and Sexual Activity  Drug Use Yes  . Types: Marijuana, IV   Comment: Heroin    Social History   Socioeconomic History  . Marital status: Single    Spouse name: Not on file  . Number of children: Not on file  . Years of education: Not on file  . Highest education level: Not on file  Occupational History  . Not on file  Social Needs  . Financial resource strain: Not on file  . Food insecurity:    Worry: Not on file    Inability: Not on file  . Transportation needs:    Medical: Not on file    Non-medical: Not on file  Tobacco Use  . Smoking status: Current Some Day Smoker    Packs/day: 0.50  . Smokeless tobacco: Never Used  Substance and Sexual Activity  . Alcohol use: Yes    Comment: rarely  . Drug use: Yes    Types: Marijuana, IV    Comment: Heroin  . Sexual activity: Yes  Lifestyle  . Physical activity:    Days per  week: Not on file    Minutes per session: Not on file  . Stress: Not on file  Relationships  . Social connections:    Talks on phone: Not on file    Gets together: Not on file    Attends religious service: Not on file    Active member of club or organization: Not on file    Attends meetings of clubs or organizations: Not on file    Relationship status: Not on file  Other Topics Concern  . Not on file  Social History Narrative  . Not on file   Additional Social History:                         Sleep: Fair  Appetite:  Fair  Current Medications: Current Facility-Administered Medications  Medication Dose Route Frequency Provider Last Rate Last Dose  . acetaminophen (TYLENOL) tablet 650 mg  650 mg Oral Q6H PRN Oneta Rack, NP      . alum & mag hydroxide-simeth (MAALOX/MYLANTA) 200-200-20 MG/5ML suspension 30 mL  30 mL Oral Q4H PRN Oneta Rack, NP      . cloNIDine (CATAPRES) tablet 0.1 mg  0.1 mg Oral QID Oneta Rack, NP   0.1 mg at 01/25/19 1619   Followed by  . [START ON  01/27/2019] cloNIDine (CATAPRES) tablet 0.1 mg  0.1 mg Oral Esperanza Heir, NP       Followed by  . [START ON 01/29/2019] cloNIDine (CATAPRES) tablet 0.1 mg  0.1 mg Oral QAC breakfast Oneta Rack, NP      . dicyclomine (BENTYL) tablet 20 mg  20 mg Oral Q6H PRN Oneta Rack, NP      . feeding supplement (ENSURE ENLIVE) (ENSURE ENLIVE) liquid 237 mL  237 mL Oral BID BM [name], [name], MD      . gabapentin (NEURONTIN) capsule 300 mg  300 mg Oral TID Malvin Johns, MD      . hydrOXYzine (ATARAX/VISTARIL) tablet 25 mg  25 mg Oral Q6H PRN Oneta Rack, NP   25 mg at 01/25/19 1621  . loperamide (IMODIUM) capsule 2-4 mg  2-4 mg Oral PRN Oneta Rack, NP      . magnesium hydroxide (MILK OF MAGNESIA) suspension 30 mL  30 mL Oral Daily PRN Oneta Rack, NP      . methocarbamol (ROBAXIN) tablet 750 mg  750 mg Oral TID Malvin Johns, MD      . naproxen (NAPROSYN) tablet 500 mg  500  mg Oral BID PRN Oneta Rack, NP      . nicotine (NICODERM CQ - dosed in mg/24 hours) patch 21 mg  21 mg Transdermal Daily [name], Rockey Situ, MD   21 mg at 01/26/19 1029  . ondansetron (ZOFRAN-ODT) disintegrating tablet 4 mg  4 mg Oral Q6H PRN Oneta Rack, NP      . traZODone (DESYREL) tablet 50 mg  50 mg Oral QHS PRN Oneta Rack, NP        Lab Results:  Results for orders placed or performed during the hospital encounter of 01/24/19 (from the past 48 hour(s))  Hepatic function panel     Status: None   Collection Time: 01/26/19  6:55 AM  Result Value Ref Range   Total Protein 7.6 6.5 - 8.1 g/dL   Albumin 4.5 3.5 - 5.0 g/dL   AST 17 15 - 41 U/L   ALT 16 0 - 44 U/L   Alkaline Phosphatase 71 38 - 126 U/L   Total Bilirubin 0.4 0.3 - 1.2 mg/dL   Bilirubin, Direct <0.3 0.0 - 0.2 mg/dL   Indirect Bilirubin NOT CALCULATED 0.3 - 0.9 mg/dL    Comment: Performed at [facility], [address] [place], [place] [address]  TSH     Status: Abnormal   Collection Time: 01/26/19  6:55 AM  Result Value Ref Range   TSH 0.126 (L) 0.350 - 4.500 uIU/mL    Comment: Performed by a 3rd Generation assay with a functional sensitivity of <=0.01 uIU/mL. Performed at [facility], [address] [place], [place] [address]   Lipid panel     Status: Abnormal   Collection Time: 01/26/19  6:57 AM  Result Value Ref Range   Cholesterol 207 (H) 0 - 200 mg/dL   Triglycerides 70 <450 mg/dL   HDL 50 >38 mg/dL   Total CHOL/HDL Ratio 4.1 RATIO   VLDL 14 0 - 40 mg/dL   LDL Cholesterol 882 (H) 0 - 99 mg/dL    Comment:        Total Cholesterol/HDL:CHD Risk Coronary Heart Disease Risk Table                     Men   Women  1/2  Average Risk   3.4   3.3  Average Risk       5.0   4.4  2 X Average Risk   9.6   7.1  3 X Average Risk  23.4   11.0        Use the calculated Patient Ratio above and the CHD Risk Table to determine the patient's CHD Risk.        ATP III  CLASSIFICATION (LDL):  <100     mg/dL   Optimal  161-096  mg/dL   Near or Above                    Optimal  130-159  mg/dL   Borderline  045-409  mg/dL   High  >811     mg/dL   Very High Performed at [facility], [address] [place], [place] [address]     Blood Alcohol level:  Lab Results  Component Value Date   ETH <10 01/24/2019   ETH <10 02/28/2018    Metabolic Disorder Labs: No results found for: HGBA1C, MPG No results found for: PROLACTIN Lab Results  Component Value Date   CHOL 207 (H) 01/26/2019   TRIG 70 01/26/2019   HDL 50 01/26/2019   CHOLHDL 4.1 01/26/2019   VLDL 14 01/26/2019   LDLCALC 143 (H) 01/26/2019    Physical Findings: AIMS:  , ,  ,  ,    CIWA:    COWS:  COWS Total Score: 0  Musculoskeletal: Strength & Muscle Tone: within normal limits Gait & Station: normal Patient leans: N/A  Psychiatric Specialty Exam: Physical Exam  ROS  Blood pressure 106/68, pulse (!) 118, temperature 97.6 F (36.4 C), temperature source Oral, resp. rate 18, height 5\' 6"  (1.676 m), weight 57.2 kg, SpO2 100 %.Body mass index is 20.34 kg/m.  General Appearance: Casual  Eye Contact:  Minimal  Speech:  Nl rate  Volume:  Decreased  Mood:  Irritable  Affect:  Appropriate  Thought Process:  Goal Directed  Orientation:  Full (Time, Place, and Person)  Thought Content:  Tangential  Suicidal Thoughts:  No  Homicidal Thoughts:  No  Memory:  nl  Judgement:  Good  Insight:  Good  Psychomotor Activity:  Normal  Concentration:  Concentration: Fair  Recall:  Fair  Fund of Knowledge:  Fair  Language:  Fair  Akathisia:  Negative  Handed:  Right  AIMS (if indicated):     Assets:  Communication Skills Desire for Improvement  ADL's:  Intact  Cognition:  WNL  Sleep:  Number of Hours: 4.5     Treatment Plan Summary: Daily contact with patient to assess and evaluate symptoms and progress in treatment, Medication management and Plan Continue current  measures and precautions but add Robaxin standing and Neurontin standing  Loretto Belinsky, MD 01/26/2019, 10:32 AM

## 2019-01-26 NOTE — Progress Notes (Signed)
Pt refused to have labs drawn this morning.  Refused EKG.

## 2019-01-27 LAB — HEPATITIS C ANTIBODY: HCV Ab: <0.1 s/co ratio (ref 0.0–0.9)

## 2019-01-27 LAB — HEPATITIS B SURFACE ANTIGEN: Hepatitis B Surface Ag: NEGATIVE

## 2019-01-27 MED ORDER — GABAPENTIN 600 MG PO TABS
300.0000 mg | ORAL_TABLET | Freq: Three times a day (TID) | ORAL | Status: DC
  Filled 2019-01-27 (×3): qty 21

## 2019-01-27 MED ORDER — GABAPENTIN 300 MG PO CAPS: 300.0000 mg | ORAL_CAPSULE | Freq: Three times a day (TID) | ORAL | 1 refills | Status: DC

## 2019-01-27 MED ORDER — METHOCARBAMOL 500 MG PO TABS
750.0000 mg | ORAL_TABLET | Freq: Three times a day (TID) | ORAL | Status: DC
  Filled 2019-01-27 (×2): qty 63

## 2019-01-27 MED ORDER — HYDROXYZINE HCL 25 MG PO TABS: 25.0000 mg | ORAL_TABLET | Freq: Four times a day (QID) | ORAL | 0 refills | Status: DC | PRN

## 2019-01-27 MED ORDER — METHOCARBAMOL 750 MG PO TABS: 750.0000 mg | ORAL_TABLET | Freq: Three times a day (TID) | ORAL | 1 refills | Status: DC

## 2019-01-27 MED ORDER — NICOTINE 21 MG/24HR TD PT24: 21.0000 mg | MEDICATED_PATCH | Freq: Every day | TRANSDERMAL | 1 refills | Status: DC

## 2019-01-27 NOTE — [facility] Group Notes (Signed)
[facility] Group Notes:Nursing Psychoeducation  Date:01/27/2019 Time:2:00 PM  Type of Therapy:Psychoeducational Skills  Group Topic: Identifying Anxiety Triggers, Debunking Cognitive Distortions, and Utilizing Coping Skills  Participation Level:Did Not Attend  Summary of Progress/Problems:Patient was invited but declined to attend group.   Clifford Clark A [name] 01/27/2019,3:00 PM 

## 2019-01-27 NOTE — Progress Notes (Signed)
The patient rated his day as a 3 out of a possible 10. He verbalized in group that he does not feel well in that he feels "dope sick". His goal for tomorrow is to get discharged early in the morning.

## 2019-01-27 NOTE — Progress Notes (Signed)
[facility] MD Progress Note  01/27/2019 9:01 AM Clifford Clark  MRN:  [id number] Subjective:   Patient reports withdrawal symptoms have largely dissipated with recent measures he does not have cramping does not have cravings dreams abuse so forth he is focused on obtaining rehab services due to homelessness but he understands there is limited availability due to the pandemic and understands with his court cases pending/charges pending he is excluded from any programs Principal Problem: Opiate dependence and withdrawal/homelessness Diagnosis: Active Problems:   MDD (major depressive disorder), recurrent, severe, with psychosis (HCC)  Total Time spent with patient: 20 minutes  Past Medical History:  Past Medical History:  Diagnosis Date  . Asthma   . Opiate abuse, continuous (HCC)    Heroin   History reviewed. No pertinent surgical history. Family History: History reviewed. No pertinent family history.  Social History:  Social History   Substance and Sexual Activity  Alcohol Use Yes   Comment: rarely     Social History   Substance and Sexual Activity  Drug Use Yes  . Types: Marijuana, IV   Comment: Heroin    Social History   Socioeconomic History  . Marital status: Single    Spouse name: Not on file  . Number of children: Not on file  . Years of education: Not on file  . Highest education level: Not on file  Occupational History  . Not on file  Social Needs  . Financial resource strain: Not on file  . Food insecurity:    Worry: Not on file    Inability: Not on file  . Transportation needs:    Medical: Not on file    Non-medical: Not on file  Tobacco Use  . Smoking status: Current Some Day Smoker    Packs/day: 0.50  . Smokeless tobacco: Never Used  Substance and Sexual Activity  . Alcohol use: Yes    Comment: rarely  . Drug use: Yes    Types: Marijuana, IV    Comment: Heroin  . Sexual activity: Yes  Lifestyle  . Physical activity:    Days per week: Not on file    Minutes per session: Not on file  . Stress: Not on file  Relationships  . Social connections:    Talks on phone: Not on file    Gets together: Not on file    Attends religious service: Not on file    Active member of club or organization: Not on file    Attends meetings of clubs or organizations: Not on file    Relationship status: Not on file  Other Topics Concern  . Not on file  Social History Narrative  . Not on file   Additional Social History:                         Sleep: Good  Appetite:  Good  Current Medications: Current Facility-Administered Medications  Medication Dose Route Frequency Provider Last Rate Last Dose  . acetaminophen (TYLENOL) tablet 650 mg  650 mg Oral Q6H PRN Oneta Rack, NP      . alum & mag hydroxide-simeth (MAALOX/MYLANTA) 200-200-20 MG/5ML suspension 30 mL  30 mL Oral Q4H PRN Lewis, Tanika N, NP      . cloNIDine (CATAPRES) tablet 0.1 mg  0.1 mg Oral Esperanza Heir, NP       Followed by  . [START ON 01/29/2019] cloNIDine (CATAPRES) tablet 0.1 mg  0.1 mg Oral QAC breakfast Hillery Jacks  N, NP      . dicyclomine (BENTYL) tablet 20 mg  20 mg Oral Q6H PRN Oneta Rack, NP      . feeding supplement (ENSURE ENLIVE) (ENSURE ENLIVE) liquid 237 mL  237 mL Oral BID BM [name], [name], MD      . gabapentin (NEURONTIN) capsule 300 mg  300 mg Oral TID Malvin Johns, MD   300 mg at 01/27/19 0818  . hydrOXYzine (ATARAX/VISTARIL) tablet 25 mg  25 mg Oral Q6H PRN Oneta Rack, NP   25 mg at 01/25/19 1621  . loperamide (IMODIUM) capsule 2-4 mg  2-4 mg Oral PRN Oneta Rack, NP      . magnesium hydroxide (MILK OF MAGNESIA) suspension 30 mL  30 mL Oral Daily PRN Oneta Rack, NP      . methocarbamol (ROBAXIN) tablet 750 mg  750 mg Oral TID Malvin Johns, MD   750 mg at 01/27/19 0818  . naproxen (NAPROSYN) tablet 500 mg  500 mg Oral BID PRN Oneta Rack, NP      . nicotine (NICODERM CQ - dosed in mg/24 hours) patch 21 mg  21 mg  Transdermal Daily [name], Rockey Situ, MD   21 mg at 01/27/19 0820  . ondansetron (ZOFRAN-ODT) disintegrating tablet 4 mg  4 mg Oral Q6H PRN Oneta Rack, NP      . traZODone (DESYREL) tablet 50 mg  50 mg Oral QHS PRN Oneta Rack, NP        Lab Results:  Results for orders placed or performed during the hospital encounter of 01/24/19 (from the past 48 hour(s))  Hepatic function panel     Status: None   Collection Time: 01/26/19  6:55 AM  Result Value Ref Range   Total Protein 7.6 6.5 - 8.1 g/dL   Albumin 4.5 3.5 - 5.0 g/dL   AST 17 15 - 41 U/L   ALT 16 0 - 44 U/L   Alkaline Phosphatase 71 38 - 126 U/L   Total Bilirubin 0.4 0.3 - 1.2 mg/dL   Bilirubin, Direct <4.0 0.0 - 0.2 mg/dL   Indirect Bilirubin NOT CALCULATED 0.3 - 0.9 mg/dL    Comment: Performed at [facility], [address] [place], [place] [address]  TSH     Status: Abnormal   Collection Time: 01/26/19  6:55 AM  Result Value Ref Range   TSH 0.126 (L) 0.350 - 4.500 uIU/mL    Comment: Performed by a 3rd Generation assay with a functional sensitivity of <=0.01 uIU/mL. Performed at [facility], [address] [place], [place] [address]   Hepatitis B surface antigen     Status: None   Collection Time: 01/26/19  6:55 AM  Result Value Ref Range   Hepatitis B Surface Ag Negative Negative    Comment: (NOTE) Performed At: [facility] [address] [place], [place] [id number] Jolene Schimke MD [id number]   Lipid panel     Status: Abnormal   Collection Time: 01/26/19  6:57 AM  Result Value Ref Range   Cholesterol 207 (H) 0 - 200 mg/dL   Triglycerides 70 <413 mg/dL   HDL 50 >24 mg/dL   Total CHOL/HDL Ratio 4.1 RATIO   VLDL 14 0 - 40 mg/dL   LDL Cholesterol 401 (H) 0 - 99 mg/dL    Comment:        Total Cholesterol/HDL:CHD Risk Coronary Heart Disease Risk Table  Men   Women  1/2 Average Risk   3.4   3.3  Average Risk       5.0   4.4  2 X Average  Risk   9.6   7.1  3 X Average Risk  23.4   11.0        Use the calculated Patient Ratio above and the CHD Risk Table to determine the patient's CHD Risk.        ATP III CLASSIFICATION (LDL):  <100     mg/dL   Optimal  161-096100-129  mg/dL   Near or Above                    Optimal  130-159  mg/dL   Borderline  045-409160-189  mg/dL   High  >811>190     mg/dL   Very High Performed at [facility], [address] [place], [place] [id number]   HIV Antibody (routine testing w rflx)     Status: None   Collection Time: 01/26/19  6:57 AM  Result Value Ref Range   HIV Screen 4th Generation wRfx Non Reactive Non Reactive    Comment: (NOTE) Performed At: [facility] [address] [place], [place] [id number] Jolene SchimkeNagendra Sanjai MD MV:[id number]:[phone]   Hepatitis C antibody     Status: None   Collection Time: 01/26/19  6:57 AM  Result Value Ref Range   HCV Ab <0.1 0.0 - 0.9 s/co ratio    Comment: (NOTE)                                  Negative:     < 0.8                             Indeterminate: 0.8 - 0.9                                  Positive:     > 0.9 The CDC recommends that a positive HCV antibody result be followed up with a HCV Nucleic Acid Amplification test ([id number]). Performed At: [facility] [address] [place], [place] [id number] Jolene SchimkeNagendra Sanjai MD GU:[id number]:[phone]     Blood Alcohol level:  Lab Results  Component Value Date   [facility] <10 01/24/2019   ETH <10 02/28/2018    Metabolic Disorder Labs: No results found for: HGBA1C, MPG No results found for: PROLACTIN Lab Results  Component Value Date   CHOL 207 (H) 01/26/2019   TRIG 70 01/26/2019   HDL 50 01/26/2019   CHOLHDL 4.1 01/26/2019   VLDL 14 01/26/2019   LDLCALC 143 (H) 01/26/2019    Physical Findings: AIMS: Facial and Oral Movements Muscles of Facial Expression: None, normal Lips and Perioral Area: None, normal Jaw: None, normal Tongue: None, normal,Extremity Movements Upper (arms,  wrists, hands, fingers): None, normal Lower (legs, knees, ankles, toes): None, normal, Trunk Movements Neck, shoulders, hips: None, normal, Overall Severity Severity of abnormal movements (highest score from questions above): None, normal Incapacitation due to abnormal movements: None, normal Patient's awareness of abnormal movements (rate only patient's report): No Awareness, Dental Status Current problems with teeth and/or dentures?: No Does patient usually wear dentures?: No  CIWA:    COWS:  COWS Total Score: 0  Musculoskeletal: Strength & Muscle Tone: within normal  limits Gait & Station: normal Patient leans: N/A  Psychiatric Specialty Exam: Physical Exam  ROS  Blood pressure 92/61, pulse (!) 104, temperature 97.7 F (36.5 C), temperature source Oral, resp. rate 18, height  (1.676 m), weight 57.2 kg, SpO2 100 %.Body mass index is 20.34 kg/m.  General Appearance: Casual  Eye Contact:  Fair  Speech:  Clear and Coherent  Volume:  Normal  Mood:  Irritable  Affect:  Congruent  Thought Process:  Coherent  Orientation:  Full (Time, Place, and Person)  Thought Content:  Tangential  Suicidal Thoughts:  No  Homicidal Thoughts:  No  Memory:  Immediate;   Fair  Judgement:  Fair  Insight:  Fair  Psychomotor Activity:  Normal  Concentration:  Concentration: Fair  Recall:  [employer] of Knowledge:  Fair  Language:  Fair  Akathisia:  Negative  Handed:  Right  AIMS (if indicated):     Assets:  Physical Health  ADL's:  Intact  Cognition:  WNL  Sleep:  Number of Hours: 6.25     Treatment Plan Summary: Daily contact with patient to assess and evaluate symptoms and progress in treatment, Medication management and Plan Case workers are looking into rehab however limited options no change in meds probable discharge tomorrow  Malvin Johns, MD 01/27/2019, 9:01 AM

## 2019-01-27 NOTE — Progress Notes (Addendum)
CSW followed up on referral to [facility] for residential substance use treatment. Patient has upcoming court dates in three counties, they will all likely be continued until June or later. CSW shared this information with the intake clinician at [facility] and asked if patient would still be eligible for a screening as none of these court dates are for violent crimes or felonies. Intake clinician states patient is still eligible for a screening and a final decision will be made by their supervisor at nurse after screening.  CSW shared this information with patient, patient is aware he may be declined tomorrow. He still wants to pursue residential treatment at [facility].  Patient will need a 30 day supply of medication and a 60 day prescription (as [facility] appointments are booking farther out and walk in hours are not available).  Patient will need discharge orders in after 3pm today.  Patient will need to take a taxi at 7:00am from [facility] tomorrow morning to arrive in time for his screening. CSW to leave a completed taxi voucher on the chart.  Enid Cutter, LCSW-A Clinical Social Worker

## 2019-01-27 NOTE — [facility] Suicide Risk Assessment (Signed)
[facility] Discharge Suicide Risk Assessment   Principal Problem: Opiate dependence/homelessness/depression Discharge Diagnoses: Active Problems:   MDD (major depressive disorder), recurrent, severe, with psychosis (HCC)   Total Time spent with patient: 45 minutes  Current mental status exam-alert oriented less irritable than earlier no thoughts of harming self contracting fully no auditory visual hallucinations no cravings tremors or withdrawal symptoms  Mental Status Per Nursing Assessment::   On Admission:  Suicidal ideation indicated by patient  Demographic Factors:  Male, Caucasian, Low socioeconomic status and Unemployed  Loss Factors: Decrease in vocational status  Historical Factors: Impulsivity  Risk Reduction Factors:   Wants to participate in rehab  Continued Clinical Symptoms:  Alcohol/Substance Abuse/Dependencies  Cognitive Features That Contribute To Risk:  Polarized thinking    Suicide Risk:  Minimal: No identifiable suicidal ideation.  Patients presenting with no risk factors but with morbid ruminations; may be classified as minimal risk based on the severity of the depressive symptoms  Follow-up Information    Services, [facility]. Go on 01/28/2019.   Why:  Your residential treatment intake screening is scheduled for 01/28/2019. You will need to arrive by 7:45am. Please bring your hospital discharge paperwork with you. Contact information: [address] [place] [address] [phone]           Plan Of Care/Follow-up recommendations:  Activity:  full  Fue Cervenka, MD 01/27/2019, 3:00 PM

## 2019-01-27 NOTE — Plan of Care (Signed)
  Problem: Education: Goal: Knowledge of [facility] Education information/materials will improve Outcome: Progressing   Problem: Health Behavior/Discharge Planning: Goal: Compliance with treatment plan for underlying cause of condition will improve Outcome: Progressing   Problem: Safety: Goal: Periods of time without injury will increase Outcome: Progressing   

## 2019-01-27 NOTE — [facility] Suicide Risk Assessment (Signed)
[facility] INPATIENT:  Family/Significant Other Suicide Prevention Education  Suicide Prevention Education:  Patient Refusal for Family/Significant Other Suicide Prevention Education: The patient Karlan Mastalski has refused to provide written consent for family/significant other to be provided Family/Significant Other Suicide Prevention Education during admission and/or prior to discharge.  Physician notified.  SPE reviewed with patient. He is hopeful to get into residential treatment tomorrow.  Darreld Mclean 01/27/2019, 10:15 AM

## 2019-01-27 NOTE — Progress Notes (Signed)
  [facility] Adult Case Management Discharge Plan :  Will you be returning to the same living situation after discharge:  No. Going to residential treatment. At discharge, do you have transportation home?: Yes,  taxi  Do you have the ability to pay for your medications: No. Samples provided.  Release of information consent forms completed and in the chart; taxi voucher on chart, needs to leave at 7:00am.  Patient to Follow up at: Follow-up Information    Services, [facility]. Go on 01/28/2019.   Why:  Your residential treatment intake screening is scheduled for 01/28/2019. You will need to arrive by 7:45am. Please bring your hospital discharge paperwork with you. Contact information: [address] [place] [address] [phone]           Next level of care provider has access to [facility] Link:no  Safety Planning and Suicide Prevention discussed: Yes,  with patient.     Has patient been referred to the Quitline?: N/A patient is not a smoker  Patient has been referred for addiction treatment: Yes  Darreld Mclean, LCSWA 01/27/2019, 3:25 PM

## 2019-01-27 NOTE — Progress Notes (Signed)
Patient ID: Clifford Clark, male   DOB: [birth year], 28 y.o.   MRN: [id number]  Nursing Progress Note [id number]  On initial approach, patient is seen up in his room. Patient presents calm and cooperative with some withdrawal symptoms including elevated HR, diarrhea and body aches. Patient compliant with scheduled medications. Patient currently denies SI/HI/AVH. Patient declined to complete self-inventory sheet.  Patient is educated about and provided medication per provider's orders. Patient safety maintained with q15 min safety checks and high fall risk precautions. Emotional support given, 1:1 interaction, and active listening provided. Patient encouraged to attend meals, groups, and work on treatment plan and goals. Labs, vital signs and patient behavior monitored throughout shift.   Patient contracts for safety with staff. Patient remains safe on the unit at this time and agrees to come to staff with any issues/concerns. Will continue to support and monitor.

## 2019-01-27 NOTE — Discharge Summary (Signed)
Physician Discharge Summary Note  Patient:  Clifford Clark is an 28 y.o., male MRN:  [id number] DOB:  Dec 07, 1990 Patient phone:  [phone] (home)  Patient address:   [address] [place] [address],  Total Time spent with patient: 15 minutes  Date of Admission:  01/24/2019 Date of Discharge: 01/28/2019  Reason for Admission:  Heroin dependence, suicidal ideation  Principal Problem: Major depressive disorder, recurrent episode, severe (HCC) Discharge Diagnoses: Principal Problem:   Major depressive disorder, recurrent episode, severe (HCC) Active Problems:   Heroin dependence (HCC)   Past Psychiatric History: From admission H&P:  Patient denies prior psychiatric admissions.  He denies history of suicide attempts.  He denies history of self-injurious behavior such as self cutting.  He denies history of psychosis.  He denies history of mania or bipolarity, he denies history of violence, he denies history of PTSD.  States he has never been on psychiatric medications in the past.  He does endorse a history of depression which he states occurs in the context of drug use and tends to improve/normalize when he is sober.  Past Medical History:  Past Medical History:  Diagnosis Date  . Asthma   . Opiate abuse, continuous (HCC)    Heroin   History reviewed. No pertinent surgical history. Family History: History reviewed. No pertinent family history. Family Psychiatric  History: From admission H&P: He denies history of psychiatric illness and family, denies history of alcohol or substance abuse in family, no suicides in family. Social History:  Social History   Substance and Sexual Activity  Alcohol Use Yes   Comment: rarely     Social History   Substance and Sexual Activity  Drug Use Yes  . Types: Marijuana, IV   Comment: Heroin    Social History   Socioeconomic History  . Marital status: Single    Spouse name: Not on file  . Number of children: Not on file  . Years of  education: Not on file  . Highest education level: Not on file  Occupational History  . Not on file  Social Needs  . Financial resource strain: Not on file  . Food insecurity:    Worry: Not on file    Inability: Not on file  . Transportation needs:    Medical: Not on file    Non-medical: Not on file  Tobacco Use  . Smoking status: Current Some Day Smoker    Packs/day: 0.50  . Smokeless tobacco: Never Used  Substance and Sexual Activity  . Alcohol use: Yes    Comment: rarely  . Drug use: Yes    Types: Marijuana, IV    Comment: Heroin  . Sexual activity: Yes  Lifestyle  . Physical activity:    Days per week: Not on file    Minutes per session: Not on file  . Stress: Not on file  Relationships  . Social connections:    Talks on phone: Not on file    Gets together: Not on file    Attends religious service: Not on file    Active member of club or organization: Not on file    Attends meetings of clubs or organizations: Not on file    Relationship status: Not on file  Other Topics Concern  . Not on file  Social History Narrative  . Not on file    Hospital Course:  From admission H&P 01/25/2019: 27 year old single male, currently homeless. Presented to hospital voluntarily reporting depression, neurovegetative symptoms, passive suicidal ideations.  He reports history of opiate (IV heroin) dependence.  States he has been using 1 g of heroin per day.  States "I am just tired of using, tired of living like this, I want to get better". Today endorses neurovegetative symptoms, mainly poor energy, poor appetite, anhedonia, recent passive SI. He reports current symptoms of opiate withdrawal to include cramps/aches, loose stools, frequent yawning.  Vomited x1 earlier today. Admission BAL negative.  No admission UDS.   Mr. Maggie SchwalbeStockard was admitted for heroin dependence with suicidal ideation. He denied history of depression outside of substance use. He was started on COWS protocol with  clonidine. He was accepted for screening with [facility] for rehab. He participated in group therapy on the unit. [facility]. He stabilized with medication and therapy. He was discharged on the medications listed below. He has shown improvement with improved mood, affect, sleep, appetite, and interaction. He denies any SI/HI/AVH and contracts for safety. [facility] He is provided with prescriptions for medications upon discharge. [facility]28/2019.  Physical Findings: AIMS: Facial and Oral Movements Muscles of Facial Expression: None, normal Lips and Perioral Area: None, normal Jaw: None, normal Tongue: None, normal,Extremity Movements Upper (arms, wrists, hands, fingers): None, normal Lower (legs, knees, ankles, toes): None, normal, Trunk Movements Neck, shoulders, hips: None, normal, Overall Severity Severity of abnormal movements (highest score from questions above): None, normal Incapacitation due to abnormal movements: None, normal Patient's awareness of abnormal movements (rate only patient's report): No Awareness, Dental Status Current problems with teeth and/or dentures?: No Does patient usually wear dentures?: No  CIWA:    COWS:  COWS Total Score: 0  Musculoskeletal: Strength & Muscle Tone: within normal limits Gait & Station: normal Patient leans: N/A  Psychiatric Specialty Exam: Physical Exam  Nursing note and vitals reviewed. Constitutional: He is oriented to person, place, and time. He appears well-developed and well-nourished.  Cardiovascular: Normal rate.  Respiratory: Effort normal.  Neurological: He is alert and oriented to person, place, and time.    Review of Systems  Constitutional: Negative.   Psychiatric/Behavioral: Positive for substance abuse (heroin). Negative for depression, hallucinations, memory loss and suicidal  ideas. The patient is nervous/anxious. The patient does not have insomnia.     Blood pressure (!) 99/50, pulse 96, temperature (!) 97.3 F (36.3 C), temperature source Oral, resp. rate 16, height 5\' 6"  (1.676 m), weight 57.2 kg, SpO2 100 %.Body mass index is 20.34 kg/m.  General Appearance: Casual  Eye Contact:  Good  Speech:  Clear and Coherent  Volume:  Normal  Mood:  Anxious  Affect:  Appropriate and Congruent  Thought Process:  Coherent  Orientation:  Full (Time, Place, and Person)  Thought Content:  WDL  Suicidal Thoughts:  No  Homicidal Thoughts:  No  Memory:  Immediate;   Good  Judgement:  Intact  Insight:  Fair  Psychomotor Activity:  Normal  Concentration:  Concentration: Good  Recall:  Good  Fund of Knowledge:  Fair  Language:  Good  Akathisia:  No  Handed:  Right  AIMS (if indicated):     Assets:  Communication Skills Desire for Improvement Resilience  ADL's:  Intact  Cognition:  WNL  Sleep:  Number of Hours: 5.75        Has this patient used any form of tobacco in the last 30 days? (Cigarettes, Smokeless Tobacco,  Cigars, and/or Pipes) Yes, a prescription for an FDA-approved medication for tobacco cessation was offered at discharge.   Blood Alcohol level:  Lab Results  Component Value Date   ETH <10 01/24/2019   ETH <10 02/28/2018    Metabolic Disorder Labs:  No results found for: HGBA1C, MPG No results found for: PROLACTIN Lab Results  Component Value Date   CHOL 207 (H) 01/26/2019   TRIG 70 01/26/2019   HDL 50 01/26/2019   CHOLHDL 4.1 01/26/2019   VLDL 14 01/26/2019   LDLCALC 143 (H) 01/26/2019    See Psychiatric Specialty Exam and Suicide Risk Assessment completed by Attending Physician prior to discharge.  Discharge destination:  [facility]  Is patient on multiple antipsychotic therapies at discharge:  No   Has Patient had three or more failed trials of antipsychotic monotherapy by history:  No  Recommended Plan for Multiple  Antipsychotic Therapies: NA  Discharge Instructions    Discharge instructions   Complete by:  As directed    Patient is instructed to take all prescribed medications as recommended. Report any side effects or adverse reactions to your outpatient psychiatrist. Patient is instructed to abstain from alcohol and illegal drugs while on prescription medications. In the event of worsening symptoms, patient is instructed to call the crisis hotline, 911, or go to the nearest emergency department for evaluation and treatment.     Allergies as of 01/28/2019   No Known Allergies     Medication List    STOP taking these medications   acetaminophen 325 MG tablet Commonly known as:  TYLENOL   ibuprofen 400 MG tablet Commonly known as:  ADVIL,MOTRIN     TAKE these medications     Indication  gabapentin 300 MG capsule Commonly known as:  NEURONTIN Take 1 capsule (300 mg total) by mouth 3 (three) times daily.  Indication:  Abuse or Misuse of Alcohol   hydrOXYzine 25 MG tablet Commonly known as:  ATARAX/VISTARIL Take 1 tablet (25 mg total) by mouth every 6 (six) hours as needed for anxiety.  Indication:  Feeling Anxious   methocarbamol 750 MG tablet Commonly known as:  ROBAXIN Take 1 tablet (750 mg total) by mouth 3 (three) times daily.  Indication:  Musculoskeletal Pain   nicotine 21 mg/24hr patch Commonly known as:  NICODERM CQ - dosed in mg/24 hours Place 1 patch (21 mg total) onto the skin daily.  Indication:  Nicotine Addiction      Follow-up Information    [facility]. Go on 01/28/2019.   Why:  Your residential treatment intake screening is scheduled for 01/28/2019. You will need to arrive by 7:45am. Please bring your hospital discharge paperwork with you. Contact information: [address] [place] [address] [phone]           Follow-up recommendations: Activity as tolerated. Diet as recommended by primary care physician. Keep all scheduled  follow-up appointments as recommended.   Comments:   Patient is instructed to take all prescribed medications as recommended. Report any side effects or adverse reactions to your outpatient psychiatrist. Patient is instructed to abstain from alcohol and illegal drugs while on prescription medications. In the event of worsening symptoms, patient is instructed to call the crisis hotline, 911, or go to the nearest emergency department for evaluation and treatment.  Signed: Aldean Baker, NP 01/28/2019, 11:18 AM

## 2019-01-28 DIAGNOSIS — F1120 Opioid dependence, uncomplicated: Secondary | ICD-10-CM

## 2019-01-28 NOTE — Progress Notes (Signed)
D:  Clifford Clark was up and visible on the unit.  He attended evening wrap up group.  He took his hs medications without difficulty.  He denied SI/HI or A/V hallucinations.  Reviewed d/c paperwork for early morning discharge and he voiced no questions or issues at this time.  He denied any pain or discomfort and appeared to be in no physical distress.  He is currently resting with his eyes closed and appears to be asleep.  A:  1:1 with RN for support and encouragement.  Medications as ordered.  Q 15 minute checks maintained for safety.  Encouraged participation in group and unit activities.   R:  Clifford Clark remains safe on the unit.  We will continue to monitor the progress towards his goals.

## 2019-01-28 NOTE — Discharge Summary (Signed)
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 Opiate Withdrawal. History of Present Illness: 28 year old single male, currently homeless. Presented to hospital voluntarily reporting depression, neurovegetative symptoms, passive suicidal ideations.  He reports history of opiate (IV heroin) dependence.  States he has been using 1 g of heroin per day.  States "I am just tired of using, tired of living like this, I want to get better". Today endorses neurovegetative symptoms, mainly poor energy, poor appetite, anhedonia, recent passive SI. He reports current symptoms of opiate withdrawal to include cramps/aches, loose stools, frequent yawning.  Vomited x1 earlier today.  Admission BAL negative.  No admission UDS.     Principal Problem: Opiate dependency Discharge Diagnoses: Active Problems:   MDD (major depressive disorder), recurrent, severe, with psychosis (HCC)   Past Medical History:  Past Medical History:  Diagnosis Date  . Asthma   . Opiate abuse, continuous (HCC)    Heroin   History reviewed. No pertinent surgical history. Family History: History reviewed. No pertinent family history.  Social History:  Social History   Substance and Sexual Activity  Alcohol Use Yes   Comment: rarely     Social History   Substance and Sexual Activity  Drug Use Yes  . Types: Marijuana, IV   Comment: Heroin    Social History   Socioeconomic History  . Marital status: Single    Spouse name: Not on file  . Number of children: Not on file  . Years of education: Not on file  . Highest education level: Not on file  Occupational  History  . Not on file  Social Needs  . Financial resource strain: Not on file  . Food insecurity:    Worry: Not on file    Inability: Not on file  . Transportation needs:    Medical: Not on file    Non-medical: Not on file  Tobacco Use  . Smoking status: Current Some Day Smoker    Packs/day: 0.50  . Smokeless tobacco: Never Used  Substance and Sexual Activity  . Alcohol use: Yes    Comment: rarely  . Drug use: Yes    Types: Marijuana, IV    Comment: Heroin  . Sexual activity: Yes  Lifestyle  . Physical activity:    Days per week: Not on file    Minutes per session: Not on file  . Stress: Not on file  Relationships  . Social connections:    Talks on phone: Not on file    Gets together: Not on file    Attends religious service: Not on file    Active member of club or organization: Not on file    Attends meetings of clubs or organizations: Not on file    Relationship status: Not on file  Other Topics Concern  . Not on file  Social History Narrative  . Not on file    Hospital Course:   Patient's detox was generally successful utilizing the [facility] protocol when he complained of cramping was given Robaxin when he complained of anxiety was given gabapentin.  He was generally irritable with staff by the date of the  seventh discharge was pending his detox was largely complete he was however homeless and rather insistent upon finding some type of rehab for recovery and housing purposes.  By the date of the eighth he was discharged to [facility] recovery services he was fortunate enough to get 1 of the rare beds there at this point in time.  He does have court dates but no firm ones due to the postponement of court due to the pandemic  Physical Findings: AIMS: Facial and Oral Movements Muscles of Facial Expression: None, normal Lips and Perioral Area: None, normal Jaw: None, normal Tongue: None, normal,Extremity Movements Upper (arms, wrists, hands, fingers): None, normal Lower  (legs, knees, ankles, toes): None, normal, Trunk Movements Neck, shoulders, hips: None, normal, Overall Severity Severity of abnormal movements (highest score from questions above): None, normal Incapacitation due to abnormal movements: None, normal Patient's awareness of abnormal movements (rate only patient's report): No Awareness, Dental Status Current problems with teeth and/or dentures?: No Does patient usually wear dentures?: No  CIWA:    COWS:  COWS Total Score: 0 Mental status exam at the point of discharge  Alert oriented to person place time situation still bit irritable but not depressed not having thoughts of harming self contracting fully no psychosis no cravings tremors or withdrawal somewhat entitled and irritable at baseline       Has this patient used any form of tobacco in the last 30 days? (Cigarettes, Smokeless Tobacco, Cigars, and/or Pipes) Yes, No  Blood Alcohol level:  Lab Results  Component Value Date   ETH <10 01/24/2019   ETH <10 02/28/2018    Metabolic Disorder Labs:  No results found for: HGBA1C, MPG No results found for: PROLACTIN Lab Results  Component Value Date   CHOL 207 (H) 01/26/2019   TRIG 70 01/26/2019   HDL 50 01/26/2019   CHOLHDL 4.1 01/26/2019   VLDL 14 01/26/2019   LDLCALC 143 (H) 01/26/2019    See Psychiatric Specialty Exam and Suicide Risk Assessment completed by Attending Physician prior to discharge.  Discharge destination:  [facility]  Is patient on multiple antipsychotic therapies at discharge:  No   Has Patient had three or more failed trials of antipsychotic monotherapy by history:  No  Recommended Plan for Multiple Antipsychotic Therapies: NA  Discharge Instructions    Discharge instructions   Complete by:  As directed    Patient is instructed to take all prescribed medications as recommended. Report any side effects or adverse reactions to your outpatient psychiatrist. Patient is instructed to abstain  from alcohol and illegal drugs while on prescription medications. In the event of worsening symptoms, patient is instructed to call the crisis hotline, 911, or go to the nearest emergency department for evaluation and treatment.     Allergies as of 01/28/2019   No Known Allergies     Medication List    STOP taking these medications   acetaminophen 325 MG tablet Commonly known as:  TYLENOL   ibuprofen 400 MG tablet Commonly known as:  ADVIL,MOTRIN     TAKE these medications     Indication  gabapentin 300 MG capsule Commonly known as:  NEURONTIN Take 1 capsule (300 mg total) by mouth 3 (three) times daily.  Indication:  Abuse or Misuse of Alcohol   hydrOXYzine 25 MG tablet Commonly known as:  ATARAX/VISTARIL Take 1 tablet (25 mg total) by mouth every 6 (six) hours as needed for anxiety.  Indication:  Feeling Anxious   methocarbamol 750 MG tablet Commonly  known as:  ROBAXIN Take 1 tablet (750 mg total) by mouth 3 (three) times daily.  Indication:  Musculoskeletal Pain   nicotine 21 mg/24hr patch Commonly known as:  NICODERM CQ - dosed in mg/24 hours Place 1 patch (21 mg total) onto the skin daily.  Indication:  Nicotine Addiction      Follow-up Information    [facility]. Go on 01/28/2019.   Why:  Your residential treatment intake screening is scheduled for 01/28/2019. You will need to arrive by 7:45am. Please bring your hospital discharge paperwork with you. Contact information: [address] [place] [id number] [phone]           Signed: Malvin JohnsFARAH,Cristen Bredeson, MD 01/28/2019, 8:11 AM

## 2019-01-28 NOTE — Progress Notes (Signed)
Clifford Clark was d/c from the unit via cab to [facility].  He was pleasant and cooperative. He voiced no SI/HI.  D/C follow up paperwork reviewed, paperwork/prescriptions given and two weeks samples given.  Belongings (from locker # 10) returned to pt. Q 15 min checks maintained until discharge.  Breakfast tray provided.  Clifford Clark left the unit in no apparent distress.

## 2019-04-02 ENCOUNTER — Encounter: Payer: Self-pay | Admitting: Gerontology

## 2019-04-02 ENCOUNTER — Ambulatory Visit: Payer: Self-pay | Admitting: Gerontology

## 2019-04-02 ENCOUNTER — Other Ambulatory Visit: Payer: Self-pay

## 2019-04-02 VITALS — BP 100/62 | HR 82 | Temp 98.1°F | Ht 67.0 in | Wt 148.0 lb

## 2019-04-02 DIAGNOSIS — F1120 Opioid dependence, uncomplicated: Secondary | ICD-10-CM

## 2019-04-02 DIAGNOSIS — Z7689 Persons encountering health services in other specified circumstances: Secondary | ICD-10-CM

## 2019-04-02 DIAGNOSIS — K0889 Other specified disorders of teeth and supporting structures: Secondary | ICD-10-CM | POA: Insufficient documentation

## 2019-04-02 NOTE — Progress Notes (Signed)
Patient ID: Clifford Clark, male   DOB: 08/18/1991, 28 y.o.   MRN: [id number]  No chief complaint on file. Patient consents to telephone visit and two patient identifiers was used to identify patient.  HPI Clifford Clark is a 28 y.o. male who presents to establish care and evaluation of chronic health problems. He was seen at the ED on 01/24/19 for depression and suicidal ideation. Currently he reports having tooth ache to his 4 molars, and it hurts sometimes when he chews, but denies abscess, fever, chills,  and requests dental referral. He also reports that he wears contact lens but has not had routine eye exam in 2 years, but he denies vision changes. He's currently at [facility] facility for polysubstance abuse,  he's using nicotine patch and has stopped smoking. He states that his mood is good, denies suicidal or homicidal ideation, chest pain, palpitation, shortness of breath and no further concern.   Past Medical History:  Diagnosis Date  . Asthma   . Opiate abuse, continuous ([place])    Heroin    No past surgical history on file.  No family history on file.  Social History Social History   Tobacco Use  . Smoking status: Current Some Day Smoker    Packs/day: 0.50  . Smokeless tobacco: Never Used  Substance Use Topics  . Alcohol use: Yes    Comment: rarely  . Drug use: Yes    Types: Marijuana, IV    Comment: Heroin    No Known Allergies  Current Outpatient Medications  Medication Sig Dispense Refill  . gabapentin (NEURONTIN) 300 MG capsule Take 1 capsule (300 mg total) by mouth 3 (three) times daily. 90 capsule 1  . hydrOXYzine (ATARAX/VISTARIL) 25 MG tablet Take 1 tablet (25 mg total) by mouth every 6 (six) hours as needed for anxiety. 60 tablet 0  . methocarbamol (ROBAXIN) 750 MG tablet Take 1 tablet (750 mg total) by mouth 3 (three) times daily. 90 tablet 1  . nicotine (NICODERM CQ - DOSED IN MG/24 HOURS) 21 mg/24hr patch Place 1 patch (21 mg total) onto the skin  daily. 28 patch 1   No current facility-administered medications for this visit.     Review of Systems Review of Systems  Constitutional: Negative.   HENT: Negative.   Eyes: Negative.   Respiratory: Negative.   Cardiovascular: Negative.   Gastrointestinal: Negative.   Genitourinary: Negative.   Musculoskeletal: Negative.   Skin: Negative.   Neurological: Negative.   Psychiatric/Behavioral: Negative.     There were no vitals taken for this visit.  Physical Exam No PE was done  Data Reviewed Routine labs and past medical history was reviewed. His lipid panel done 2 months ago, LDL was 143 mg/dl, total cholesterol was 207, will recheck Lipid panel. Also TSH was 0.126 and he denies fatigue, cold or heat intolerance, TSH will be rechecked.  Assessment and Plan   1. Encounter to establish care - Routine labs will be rechecked and he will be referred for ophthalmology exam. - Lipid panel; Future - TSH; Future - Ambulatory referral to Ophthalmology  2. Tooth ache - He was advised to take otc tylenol for tooth ache. - Ambulatory referral to Dentistry   Follow up in 5 week 05/07/19 or if symptom worsens.  Tandy Lewin E Teria Khachatryan 04/02/2019, 8:42 AM

## 2019-04-20 ENCOUNTER — Other Ambulatory Visit: Payer: Self-pay

## 2019-04-20 ENCOUNTER — Ambulatory Visit: Payer: Self-pay | Admitting: Physician Assistant

## 2019-04-20 ENCOUNTER — Other Ambulatory Visit (HOSPITAL_COMMUNITY): Payer: Self-pay | Admitting: Family Medicine

## 2019-04-20 DIAGNOSIS — Z113 Encounter for screening for infections with a predominantly sexual mode of transmission: Secondary | ICD-10-CM

## 2019-04-20 NOTE — Progress Notes (Signed)
Password is [id number]

## 2019-04-21 ENCOUNTER — Encounter: Payer: Self-pay | Admitting: Physician Assistant

## 2019-04-21 NOTE — Progress Notes (Signed)
STI clinic/screening visit  Subjective:  Clifford Clark is a 28 y.o. male being seen today for an STI screening visit. The patient reports they do not have symptoms.  Patient has the following medical conditionshas Encounter to establish care and Tooth ache on their problem list.  Chief Complaint  Patient presents with  . SEXUALLY TRANSMITTED DISEASE    patient requests screening    Patient reports no symptoms today.  Reports that he is currently in a rehab facility and has 2.5 months clean and wants to make sure every thing is ok. HPI  Denies any symptoms or concerns today.   See flowsheet for further details and programmatic requirements.    The following portions of the patient's history were reviewed and updated as appropriate: allergies, current medications, past family history, past medical history, past social history, past surgical history and problem list. Problem list updated.  Objective:  There were no vitals filed for this visit.  Physical Exam Constitutional:      Appearance: Normal appearance.  HENT:     Head: Normocephalic and atraumatic.     Mouth/Throat:     Mouth: Mucous membranes are moist.     Pharynx: Oropharynx is clear. No posterior oropharyngeal erythema.  Neck:     Musculoskeletal: Neck supple.  Abdominal:     General: Abdomen is flat.     Palpations: Abdomen is soft. There is no mass.     Tenderness: There is no abdominal tenderness.  Genitourinary:    Penis: Normal.      Scrotum/Testes: Normal.     Comments: No edema, erythema, lesions or urethral discharge No testicular masses  Circumcised Lymphadenopathy:     Cervical: No cervical adenopathy.     Upper Body:     Right upper body: No supraclavicular adenopathy.     Left upper body: No supraclavicular adenopathy.     Lower Body: No right inguinal adenopathy. No left inguinal adenopathy.  Skin:    General: Skin is warm and dry.     Findings: No lesion or rash.  Neurological:   Mental Status: He is alert and oriented to person, place, and time.  Psychiatric:        Mood and Affect: Mood normal.       Assessment and Plan:  Clifford Clark is a 28 y.o. male presenting to the [facility] for STI screening  1. Screening examination for venereal disease Reviewed Gram stain and no treatment needed today Rec condoms with all sex for STD protection Await test results from today's visit and patient counseled that he should receive a call and the RN would ask for his password for the results  - HIV/HCV [facility] - Syphilis Serology, [facility] - Gram stain - Hepatitis B surface antigen - Gonococcus culture  2.  Substance Abuse Patient reports that he is 2.5 months clean from opiates, MJ, Benzodiazepines, and cocaine.  Is currently in rehab. Counseled patient re:  Availability of Narcan kits at [facility]. Patient declines one today and states that he has a Narcan kit already Counseled patient that if he needs a kit in future, he can come in and ask for one without an appiontment.   No follow-ups on file.  Future Appointments  Date Time Provider Department Center  05/06/2019 11:30 AM [facility] NURSE [facility] None  05/12/2019  1:00 PM [place], Chioma E, NP [facility] None     J , PA 

## 2019-04-22 LAB — GRAM STAIN

## 2019-04-25 LAB — GONOCOCCUS CULTURE

## 2019-04-28 ENCOUNTER — Other Ambulatory Visit: Payer: Self-pay

## 2019-04-28 LAB — HM HEPATITIS C SCREENING LAB: HM Hepatitis Screen: NEGATIVE

## 2019-04-28 NOTE — Progress Notes (Signed)
Heb B surface antibody reactive; patient Hep B vaccinated x 3 Aileen Fass, RN

## 2019-05-06 ENCOUNTER — Other Ambulatory Visit: Payer: Self-pay

## 2019-05-06 DIAGNOSIS — Z7689 Persons encountering health services in other specified circumstances: Secondary | ICD-10-CM

## 2019-05-07 LAB — LIPID PANEL
Chol/HDL Ratio: 3.1 ratio (ref 0.0–5.0)
Cholesterol, Total: 160 mg/dL (ref 100–199)
HDL: 51 mg/dL (ref >39)
LDL Calculated: 88 mg/dL (ref 0–99)
Triglycerides: 106 mg/dL (ref 0–149)
VLDL Cholesterol Cal: 21 mg/dL (ref 5–40)

## 2019-05-07 LAB — TSH: TSH: 1.46 u[IU]/mL (ref 0.450–4.500)

## 2019-05-12 ENCOUNTER — Ambulatory Visit: Payer: Self-pay | Admitting: Gerontology

## 2019-07-09 NOTE — Addendum Note (Signed)
Addended by: Cletis Media on: 07/09/2019 10:28 AM   Modules accepted: Orders

## 2019-10-07 ENCOUNTER — Encounter: Payer: Self-pay | Admitting: Emergency Medicine

## 2019-10-07 ENCOUNTER — Other Ambulatory Visit: Payer: Self-pay

## 2019-10-07 ENCOUNTER — Emergency Department
Admission: EM | Admit: 2019-10-07 | Discharge: 2019-10-07 | Disposition: A | Discharge status: Home / Self Care | Payer: Self-pay | Attending: Emergency Medicine | Admitting: Emergency Medicine

## 2019-10-07 DIAGNOSIS — J45909 Unspecified asthma, uncomplicated: Secondary | ICD-10-CM | POA: Insufficient documentation

## 2019-10-07 DIAGNOSIS — R110 Nausea: Secondary | ICD-10-CM | POA: Insufficient documentation

## 2019-10-07 DIAGNOSIS — R4182 Altered mental status, unspecified: Secondary | ICD-10-CM | POA: Insufficient documentation

## 2019-10-07 DIAGNOSIS — T401X1A Poisoning by heroin, accidental (unintentional), initial encounter: Secondary | ICD-10-CM | POA: Insufficient documentation

## 2019-10-07 DIAGNOSIS — Z87891 Personal history of nicotine dependence: Secondary | ICD-10-CM | POA: Insufficient documentation

## 2019-10-07 MED ORDER — NALOXONE HCL 0.4 MG/ML IJ SOLN: INTRAMUSCULAR | 0 refills | Status: DC

## 2019-10-07 NOTE — ED Triage Notes (Signed)
Patient presents to the ED via EMS from [place].  Patient was found unresponsive in the drive through with a needle in his lap.  Patient was given 2mg  of narcan intranasally and then 2mg  of narcan IV.  This induced vomiting and EMS gave 4mg  of zofran.  Patient is now alert and oriented x 4.  Respirations even and not labored.  Patient clearing his throat occasinally.  Patient reports overdosing on heroin.  Reports daily use.  Denies suicidal ideation.  Patient is calm and cooperative at this time.

## 2019-10-07 NOTE — ED Provider Notes (Signed)
[facility] Emergency Department Provider Note  ____________________________________________   First MD Initiated Contact with Patient 10/07/19 [phone]     (approximate)  I have reviewed the triage vital signs and the nursing notes.   HISTORY  Chief Complaint Drug Overdose    HPI Clifford Clark is a 28 y.o. male  Here with heroin overdose. Pt was found passed out in line at [employer]. He was reportedly found with a syringe in lap. He has a reported history of chronic opiate dependence with prior overdoses. He was given 2 doses of narcan in the field with improvement and resolution of somnolence and bradypnea. He arrives in mild discomfort with nausea. Denies any intentional OD.  Reports he does have Narcan at home.  Level 5 caveat invoked as remainder of history, ROS, and physical exam limited due to patient's heroin intoxication.         Past Medical History:  Diagnosis Date  . Asthma   . Opiate abuse, continuous ([place])    Heroin    Patient Active Problem List   Diagnosis Date Noted  . Encounter to establish care 04/02/2019  . Tooth ache 04/02/2019    History reviewed. No pertinent surgical history.  Prior to Admission medications   Medication Sig Start Date End Date Taking? Authorizing Provider  naloxone [facility]) 0.4 MG/ML injection Take as needed for accidental opiate overdose 10/07/19   Duffy Bruce, MD  nicotine (NICODERM CQ - DOSED IN MG/24 HOURS) 21 mg/24hr patch Place 1 patch (21 mg total) onto the skin daily. 01/28/19   Connye Burkitt, NP    Allergies Patient has no known allergies.  No family history on file.  Social History Social History   Tobacco Use  . Smoking status: Former Smoker    Packs/day: 0.50  . Smokeless tobacco: Never Used  . Tobacco comment: using nicotine patches  Substance Use Topics  . Alcohol use: Not Currently    Comment: rarely  . Drug use: Not Currently    Types: Marijuana, IV    Comment: Heroin     Review of Systems  Review of Systems  Unable to perform ROS: Mental status change     ____________________________________________  PHYSICAL EXAM:      VITAL SIGNS: ED Triage Vitals [10/07/19 0724]  Enc Vitals Group     BP 130/86     Pulse Rate (!) 106     Resp 12     Temp 98.7 F (37.1 C)     Temp Source Oral     SpO2 100 %     Weight 165 lb (74.8 kg)     Height 5\' 8"  (1.727 m)     Head Circumference      Peak Flow      Pain Score 7     Pain Loc      Pain Edu?      Excl. in [place]?      Physical Exam Vitals and nursing note reviewed.  Constitutional:      General: He is not in acute distress.    Appearance: He is well-developed.  HENT:     Head: Normocephalic and atraumatic.  Eyes:     Conjunctiva/sclera: Conjunctivae normal.     Comments: Pupils pinopint but reactive bilaterally  Cardiovascular:     Rate and Rhythm: Normal rate and regular rhythm.     Heart sounds: Normal heart sounds.  Pulmonary:     Effort: Pulmonary effort is normal. No respiratory distress.  Breath sounds: No wheezing.     Comments: Mild bradypnea Abdominal:     General: There is no distension.  Musculoskeletal:     Cervical back: Neck supple.  Skin:    General: Skin is warm.     Capillary Refill: Capillary refill takes less than 2 seconds.     Findings: No rash.     Comments: Multiple track marks, no focal redness or fluctuance  Neurological:     Mental Status: He is alert and oriented to person, place, and time.     Motor: No abnormal muscle tone.       ____________________________________________   LABS (all labs ordered are listed, but only abnormal results are displayed)  Labs Reviewed - No data to display  ____________________________________________  EKG: Sinus tachycardia, ventricular rate 108.  PR 128, QRS 106, QTc 494.  Slight prolonged QT interval, otherwise no acute ischemia or infarct. ________________________________________  RADIOLOGY All imaging,  including plain films, CT scans, and ultrasounds, independently reviewed by me, and interpretations confirmed via formal radiology reads.  ED MD interpretation:   None  Official radiology report(s): No results found.  ____________________________________________  PROCEDURES   Procedure(s) performed (including Critical Care):  Procedures  ____________________________________________  INITIAL IMPRESSION / MDM / ASSESSMENT AND PLAN / ED COURSE  As part of my medical decision making, I reviewed the following data within the electronic MEDICAL RECORD NUMBER Nursing notes reviewed and incorporated, Old chart reviewed, Notes from prior ED visits, and [place] Controlled Substance Database       *Clifford Clark was evaluated in Emergency Department on 10/07/2019 for the symptoms described in the history of present illness. He was evaluated in the context of the global COVID-19 pandemic, which necessitated consideration that the patient might be at risk for infection with the SARS-CoV-2 virus that causes COVID-19. Institutional protocols and algorithms that pertain to the evaluation of patients at risk for COVID-19 are in a state of rapid change based on information released by regulatory bodies including the CDC and federal and state organizations. These policies and algorithms were followed during the patient's care in the ED.  Some ED evaluations and interventions may be delayed as a result of limited staffing during the pandemic.*     Medical Decision Making: 28 year old male here with accidental opiate overdose.  He was monitored for several hours after Narcan.  He was moderately drowsy initially, but did not require additional dose. OD was not intentional. He is otherwise calm and cooperative. No SI. Will provide with outpatient resources and narcan rx.  ____________________________________________  FINAL CLINICAL IMPRESSION(S) / ED DIAGNOSES  Final diagnoses:  Accidental overdose of heroin,  initial encounter (HCC)     MEDICATIONS GIVEN DURING THIS VISIT:  Medications - No data to display   ED Discharge Orders         Ordered    naloxone (NARCAN) 0.4 MG/ML injection     10/07/19 1137           Note:  This document was prepared using Dragon voice recognition software and may include unintentional dictation errors.   Shaune Pollack, MD 10/07/19 [phone]

## 2019-10-07 NOTE — ED Notes (Signed)
Patient given ginger ale to drink.  Will continue to monitor.

## 2019-10-07 NOTE — ED Notes (Signed)
This RN performed a forensic blood draw for the [facility] after they provided a warrant for the blood.  Charge RN, Water quality scientist notified of blood draw and warrant.  Patient verbalized understanding and cooperated with blood draw.  Patient is alert but appears tired.  Opening eyes with verbal stimulation.

## 2019-10-07 NOTE — ED Notes (Signed)
Pt tolerated ginger ale and was able to ambulate in hall without difficulties

## 2019-10-07 NOTE — ED Notes (Signed)
MD in room to assess patient.  Will continue to monitor.   

## 2019-12-04 ENCOUNTER — Other Ambulatory Visit: Payer: Self-pay

## 2019-12-04 ENCOUNTER — Emergency Department
Admission: EM | Admit: 2019-12-04 | Discharge: 2019-12-04 | Payer: Self-pay | Attending: Emergency Medicine | Admitting: Emergency Medicine

## 2019-12-04 DIAGNOSIS — Z79899 Other long term (current) drug therapy: Secondary | ICD-10-CM | POA: Insufficient documentation

## 2019-12-04 DIAGNOSIS — Z87891 Personal history of nicotine dependence: Secondary | ICD-10-CM | POA: Insufficient documentation

## 2019-12-04 DIAGNOSIS — J45909 Unspecified asthma, uncomplicated: Secondary | ICD-10-CM | POA: Insufficient documentation

## 2019-12-04 DIAGNOSIS — F1910 Other psychoactive substance abuse, uncomplicated: Secondary | ICD-10-CM | POA: Insufficient documentation

## 2019-12-04 LAB — URINE DRUG SCREEN, QUALITATIVE (ARMC ONLY)
Amphetamines, Ur Screen: NOT DETECTED
Barbiturates, Ur Screen: NOT DETECTED
Benzodiazepine, Ur Scrn: NOT DETECTED
Cannabinoid 50 Ng, Ur ~~LOC~~: NOT DETECTED
Cocaine Metabolite,Ur ~~LOC~~: POSITIVE — AB
MDMA (Ecstasy)Ur Screen: NOT DETECTED
Methadone Scn, Ur: NOT DETECTED
Opiate, Ur Screen: POSITIVE — AB
Phencyclidine (PCP) Ur S: NOT DETECTED
Tricyclic, Ur Screen: NOT DETECTED

## 2019-12-04 LAB — URINALYSIS, COMPLETE (UACMP) WITH MICROSCOPIC
Bacteria, UA: NONE SEEN
Bilirubin Urine: NEGATIVE
Glucose, UA: 50 mg/dL — AB
Hgb urine dipstick: NEGATIVE
Ketones, ur: NEGATIVE mg/dL
Leukocytes,Ua: NEGATIVE
Nitrite: NEGATIVE
Protein, ur: 30 mg/dL — AB
Specific Gravity, Urine: 1.019 (ref 1.005–1.030)
Squamous Epithelial / HPF: NONE SEEN (ref 0–5)
pH: 5 (ref 5.0–8.0)

## 2019-12-04 LAB — CBC
HCT: 45.3 % (ref 39.0–52.0)
Hemoglobin: 15 g/dL (ref 13.0–17.0)
MCH: 28.9 pg (ref 26.0–34.0)
MCHC: 33.1 g/dL (ref 30.0–36.0)
MCV: 87.3 fL (ref 80.0–100.0)
Platelets: 363 10*3/uL (ref 150–400)
RBC: 5.19 MIL/uL (ref 4.22–5.81)
RDW: 12 % (ref 11.5–15.5)
WBC: 11.5 10*3/uL — ABNORMAL HIGH (ref 4.0–10.5)
nRBC: 0 % (ref 0.0–0.2)

## 2019-12-04 LAB — ETHANOL: Alcohol, Ethyl (B): <10 mg/dL (ref <10)

## 2019-12-04 LAB — COMPREHENSIVE METABOLIC PANEL
ALT: 22 U/L (ref 0–44)
AST: 23 U/L (ref 15–41)
Albumin: 4.5 g/dL (ref 3.5–5.0)
Alkaline Phosphatase: 71 U/L (ref 38–126)
Anion gap: 10 (ref 5–15)
BUN: 13 mg/dL (ref 6–20)
CO2: 29 mmol/L (ref 22–32)
Calcium: 9.5 mg/dL (ref 8.9–10.3)
Chloride: 97 mmol/L — ABNORMAL LOW (ref 98–111)
Creatinine, Ser: 1.03 mg/dL (ref 0.61–1.24)
GFR calc Af Amer: >60 mL/min (ref >60)
GFR calc non Af Amer: >60 mL/min (ref >60)
Glucose, Bld: 217 mg/dL — ABNORMAL HIGH (ref 70–99)
Potassium: 3.9 mmol/L (ref 3.5–5.1)
Sodium: 136 mmol/L (ref 135–145)
Total Bilirubin: 0.9 mg/dL (ref 0.3–1.2)
Total Protein: 8 g/dL (ref 6.5–8.1)

## 2019-12-04 NOTE — ED Triage Notes (Addendum)
Pt brought in by [employer] for medical clearance. Pt was involved in a DUI and seems altered, jail needs him medically cleared. . Pt states he has being using heroin and cocaine. No injury noted or other complaints.

## 2019-12-04 NOTE — ED Provider Notes (Signed)
[facility] Emergency Department Provider Note  ____________________________________________   First MD Initiated Contact with Patient 12/04/19 [phone]     (approximate)  I have reviewed the triage vital signs and the nursing notes.   HISTORY  Chief Complaint Medical Clearance    HPI Randall Colden is a 29 y.o. male  With h/o polysubstance abuse here needing med clearance. Pt was pulled over by PD around 2 AM with DUI. He was notably drowsy, intoxicated at the time so brought here. He endorses +heroin and +cocaine abuse. Denies any intentional overdose or self harm. He remembers everything including getting pulled over. There was no accident or MVC. He has been drowsy but resting since arrival to ED. He has not been noted to be hypoxic. No other complaints on my assessment.        Past Medical History:  Diagnosis Date  . Asthma   . Opiate abuse, continuous ([place])    Heroin    Patient Active Problem List   Diagnosis Date Noted  . Encounter to establish care 04/02/2019  . Tooth ache 04/02/2019    No past surgical history on file.  Prior to Admission medications   Medication Sig Start Date End Date Taking? Authorizing Provider  naloxone [facility]) 0.4 MG/ML injection Take as needed for accidental opiate overdose 10/07/19   Duffy Bruce, MD  nicotine (NICODERM CQ - DOSED IN MG/24 HOURS) 21 mg/24hr patch Place 1 patch (21 mg total) onto the skin daily. 01/28/19   Connye Burkitt, NP    Allergies Patient has no known allergies.  No family history on file.  Social History Social History   Tobacco Use  . Smoking status: Former Smoker    Packs/day: 0.50  . Smokeless tobacco: Never Used  . Tobacco comment: using nicotine patches  Substance Use Topics  . Alcohol use: Not Currently    Comment: rarely  . Drug use: Not Currently    Types: Marijuana, IV    Comment: Heroin    Review of Systems  Review of Systems  Constitutional: Negative for  chills and fever.  HENT: Negative for sore throat.   Respiratory: Negative for shortness of breath.   Cardiovascular: Negative for chest pain.  Gastrointestinal: Negative for abdominal pain.  Genitourinary: Negative for flank pain.  Musculoskeletal: Negative for neck pain.  Skin: Negative for rash and wound.  Allergic/Immunologic: Negative for immunocompromised state.  Neurological: Negative for weakness and numbness.  Hematological: Does not bruise/bleed easily.     ____________________________________________  PHYSICAL EXAM:      VITAL SIGNS: ED Triage Vitals  Enc Vitals Group     BP 12/04/19 0400 (!) 143/84     Pulse Rate 12/04/19 0400 (!) 118     Resp 12/04/19 0400 20     Temp 12/04/19 0400 98 F (36.7 C)     Temp Source 12/04/19 0400 Oral     SpO2 12/04/19 0400 98 %     Weight 12/04/19 0401 160 lb (72.6 kg)     Height 12/04/19 0401 5\' 7"  (1.702 m)     Head Circumference --      Peak Flow --      Pain Score 12/04/19 0401 0     Pain Loc --      Pain Edu? --      Excl. in [place]? --      Physical Exam Vitals and nursing note reviewed.  Constitutional:      General: He is not in acute distress.  Appearance: He is well-developed.     Comments: Drowsy but easily aroused to voice  HENT:     Head: Normocephalic and atraumatic.  Eyes:     Conjunctiva/sclera: Conjunctivae normal.     Comments: Pupils pinpoint but reactive b/l  Cardiovascular:     Rate and Rhythm: Normal rate and regular rhythm.     Heart sounds: Normal heart sounds.  Pulmonary:     Effort: Pulmonary effort is normal. No respiratory distress.     Breath sounds: No wheezing.  Abdominal:     General: There is no distension.  Musculoskeletal:     Cervical back: Neck supple.  Skin:    General: Skin is warm.     Capillary Refill: Capillary refill takes less than 2 seconds.     Findings: No rash.     Comments: Track marks/injection sites b/l AC, no bruising, no redness.  Neurological:     Mental  Status: He is alert and oriented to person, place, and time.     Motor: No abnormal muscle tone.     Comments: Oriented x 4. Follows commands. Ambulatory. No focal neuro deficits.       ____________________________________________   LABS (all labs ordered are listed, but only abnormal results are displayed)  Labs Reviewed  CBC - Abnormal; Notable for the following components:      Result Value   WBC 11.5 (*)    All other components within normal limits  COMPREHENSIVE METABOLIC PANEL - Abnormal; Notable for the following components:   Chloride 97 (*)    Glucose, Bld 217 (*)    All other components within normal limits  URINALYSIS, COMPLETE (UACMP) WITH MICROSCOPIC - Abnormal; Notable for the following components:   Color, Urine YELLOW (*)    APPearance HAZY (*)    Glucose, UA 50 (*)    Protein, ur 30 (*)    All other components within normal limits  URINE DRUG SCREEN, QUALITATIVE ([facility] ONLY) - Abnormal; Notable for the following components:   Cocaine Metabolite,Ur [place] POSITIVE (*)    Opiate, Ur Screen POSITIVE (*)    All other components within normal limits  ETHANOL    ____________________________________________  EKG: None ________________________________________  RADIOLOGY All imaging, including plain films, CT scans, and ultrasounds, independently reviewed by me, and interpretations confirmed via formal radiology reads.  ED MD interpretation:   None  Official radiology report(s): No results found.  ____________________________________________  PROCEDURES   Procedure(s) performed (including Critical Care):  Procedures  ____________________________________________  INITIAL IMPRESSION / MDM / ASSESSMENT AND PLAN / ED COURSE  As part of my medical decision making, I reviewed the following data within the electronic MEDICAL RECORD NUMBER Nursing notes reviewed and incorporated, Old chart reviewed, Notes from prior ED visits, and [place] Controlled Substance  Database       *Huxton Glaus was evaluated in Emergency Department on 12/04/2019 for the symptoms described in the history of present illness. He was evaluated in the context of the global COVID-19 pandemic, which necessitated consideration that the patient might be at risk for infection with the SARS-CoV-2 virus that causes COVID-19. Institutional protocols and algorithms that pertain to the evaluation of patients at risk for COVID-19 are in a state of rapid change based on information released by regulatory bodies including the CDC and federal and state organizations. These policies and algorithms were followed during the patient's care in the ED.  Some ED evaluations and interventions may be delayed as a result of limited staffing during  the pandemic.*     Medical Decision Making:  29 yo M here with DUI after opiate and cocaine abuse. Labs reviewed and are reassuring. Slight hyperglycemia likely 2/2 recent meal and stress reaction. No hypoglycemia. No focal neuro deficits. He is drowsy but easily awakes to voice, follows commands, and is ambulatory. He is tolerating PO. D/c in custody.  ____________________________________________  FINAL CLINICAL IMPRESSION(S) / ED DIAGNOSES  Final diagnoses:  Polysubstance abuse (HCC)     MEDICATIONS GIVEN DURING THIS VISIT:  Medications - No data to display   ED Discharge Orders    None       Note:  This document was prepared using Dragon voice recognition software and may include unintentional dictation errors.   Shaune Pollack, MD 12/04/19 [phone]

## 2019-12-04 NOTE — ED Notes (Signed)
Forensic blood draw done for police, pt signed consent. Specimens released to [employer] officer Poe, collected per policy.

## 2019-12-04 NOTE — ED Notes (Signed)
Patient easily arousable. Able to eat some crackers and drink sprite

## 2019-12-04 NOTE — ED Notes (Signed)
Pt is ambulatory to his room, fell asleep after being seated in the bed, but is easily arousable and alert and oriented.

## 2019-12-04 NOTE — ED Notes (Signed)
PAtient given sprite and crackers

## 2020-01-10 ENCOUNTER — Encounter (HOSPITAL_COMMUNITY): Payer: Self-pay | Admitting: Obstetrics and Gynecology

## 2020-01-10 ENCOUNTER — Emergency Department (HOSPITAL_COMMUNITY): Payer: Self-pay

## 2020-01-10 ENCOUNTER — Emergency Department (HOSPITAL_COMMUNITY)
Admission: EM | Admit: 2020-01-10 | Discharge: 2020-01-10 | Disposition: A | Discharge status: Home / Self Care | Payer: Self-pay | Attending: Emergency Medicine | Admitting: Emergency Medicine

## 2020-01-10 ENCOUNTER — Other Ambulatory Visit: Payer: Self-pay

## 2020-01-10 DIAGNOSIS — L02512 Cutaneous abscess of left hand: Secondary | ICD-10-CM | POA: Insufficient documentation

## 2020-01-10 DIAGNOSIS — T401X1A Poisoning by heroin, accidental (unintentional), initial encounter: Secondary | ICD-10-CM | POA: Insufficient documentation

## 2020-01-10 DIAGNOSIS — F1110 Opioid abuse, uncomplicated: Secondary | ICD-10-CM | POA: Insufficient documentation

## 2020-01-10 DIAGNOSIS — L03113 Cellulitis of right upper limb: Secondary | ICD-10-CM | POA: Insufficient documentation

## 2020-01-10 LAB — CBC WITH DIFFERENTIAL/PLATELET
Abs Immature Granulocytes: 0.13 10*3/uL — ABNORMAL HIGH (ref 0.00–0.07)
Basophils Absolute: 0.1 10*3/uL (ref 0.0–0.1)
Basophils Relative: 1 %
Eosinophils Absolute: 0.3 10*3/uL (ref 0.0–0.5)
Eosinophils Relative: 2 %
HCT: 41.9 % (ref 39.0–52.0)
Hemoglobin: 13.7 g/dL (ref 13.0–17.0)
Immature Granulocytes: 1 %
Lymphocytes Relative: 34 %
Lymphs Abs: 4.7 10*3/uL — ABNORMAL HIGH (ref 0.7–4.0)
MCH: 28.5 pg (ref 26.0–34.0)
MCHC: 32.7 g/dL (ref 30.0–36.0)
MCV: 87.1 fL (ref 80.0–100.0)
Monocytes Absolute: 1.2 10*3/uL — ABNORMAL HIGH (ref 0.1–1.0)
Monocytes Relative: 8 %
Neutro Abs: 7.5 10*3/uL (ref 1.7–7.7)
Neutrophils Relative %: 54 %
Platelets: 548 10*3/uL — ABNORMAL HIGH (ref 150–400)
RBC: 4.81 MIL/uL (ref 4.22–5.81)
RDW: 12.8 % (ref 11.5–15.5)
WBC: 13.9 10*3/uL — ABNORMAL HIGH (ref 4.0–10.5)
nRBC: 0 % (ref 0.0–0.2)

## 2020-01-10 LAB — COMPREHENSIVE METABOLIC PANEL
ALT: 17 U/L (ref 0–44)
AST: 22 U/L (ref 15–41)
Albumin: 4.4 g/dL (ref 3.5–5.0)
Alkaline Phosphatase: 71 U/L (ref 38–126)
Anion gap: 12 (ref 5–15)
BUN: 18 mg/dL (ref 6–20)
CO2: 26 mmol/L (ref 22–32)
Calcium: 9.6 mg/dL (ref 8.9–10.3)
Chloride: 99 mmol/L (ref 98–111)
Creatinine, Ser: 0.92 mg/dL (ref 0.61–1.24)
GFR calc Af Amer: >60 mL/min (ref >60)
GFR calc non Af Amer: >60 mL/min (ref >60)
Glucose, Bld: 129 mg/dL — ABNORMAL HIGH (ref 70–99)
Potassium: 3.6 mmol/L (ref 3.5–5.1)
Sodium: 137 mmol/L (ref 135–145)
Total Bilirubin: 0.6 mg/dL (ref 0.3–1.2)
Total Protein: 8 g/dL (ref 6.5–8.1)

## 2020-01-10 LAB — LACTIC ACID, PLASMA: Lactic Acid, Venous: 1.6 mmol/L (ref 0.5–1.9)

## 2020-01-10 MED ORDER — ONDANSETRON HCL 4 MG/2ML IJ SOLN: 4.0000 mg | Freq: Once | INTRAMUSCULAR | Status: AC

## 2020-01-10 MED ORDER — DOXYCYCLINE HYCLATE 100 MG PO CAPS: 100.0000 mg | ORAL_CAPSULE | Freq: Two times a day (BID) | ORAL | 0 refills | Status: DC

## 2020-01-10 MED ORDER — LIDOCAINE HCL (PF) 1 % IJ SOLN
5.0000 mL | Freq: Once | INTRAMUSCULAR | Status: AC
  Administered 2020-01-10: 5 mL
  Filled 2020-01-10: qty 30

## 2020-01-10 MED ORDER — ONDANSETRON HCL 4 MG/2ML IJ SOLN
INTRAMUSCULAR | Status: AC
  Administered 2020-01-10: 18:00:00 4 mg via INTRAVENOUS
  Filled 2020-01-10: qty 2

## 2020-01-10 MED ORDER — SODIUM CHLORIDE 0.9 % IV BOLUS
1000.0000 mL | Freq: Once | INTRAVENOUS | Status: AC
  Administered 2020-01-10: 1000 mL via INTRAVENOUS

## 2020-01-10 MED ORDER — DOXYCYCLINE HYCLATE 100 MG PO TABS
100.0000 mg | ORAL_TABLET | Freq: Once | ORAL | Status: AC
  Administered 2020-01-10: 100 mg via ORAL
  Filled 2020-01-10: qty 1

## 2020-01-10 NOTE — Discharge Instructions (Signed)
You came close to dying tonight from the overdose of heroin.  Please avoid using heroin.  The infection in your left hand was drained, and will need more drainage by applying warm compresses to it every 1-2 hours for several days until all the drainage ceases and the wound heals.  The right arm infection, did not require drainage but is infected.  To help with the cellulitis heal, use a warm compress on it every 1-2 hours for several days until the redness and discomfort are improved.  Start taking the antibiotic prescription tomorrow morning.  Continue it until gone.  If you feel like your infections are not improving return here or see the doctor of your choice for another evaluation.

## 2020-01-10 NOTE — ED Provider Notes (Signed)
[facility]-EMERGENCY DEPT Provider Note   CSN: [id number] Arrival date & time: 01/10/20  1652     History Chief Complaint  Patient presents with  . Drug Overdose    Clifford Clark is a 29 y.o. male.  HPI He presents for evaluation of infection in the right arm and left hand.  Patient was triaged, waiting in the waiting room, when he went to the bathroom.  He was in there for 10 minutes, and nurse went to check on him, and he was unresponsive, and drooling at the mouth.  She found a syringe, likely heroin, on the floor next to him.  He was immediately stimulated and woke up.  He was put in a wheelchair and taken to a treatment room.  I saw him during the transition from waiting room, to the resuscitation room.  He was alert and breathing normally.  After arrival in the resuscitation bay, resuscitation was begun.  Initial O2 sat was 90% on room air.  He spontaneously improved to 96%, while IV access was being obtained.  O2 sat subsequently diminished, and he was given IV Narcan with recovery of sensorium, within about 30 seconds.  Patient did not have apnea, while I was with him.  After Narcan recovery, patient was able to give history that he has been bothered by swelling in his right hand for 2 days, preceded by redness and swelling in the right elbow, at a site of vein injection, 4 or 5 days ago.  He has been trying to open this wound by scratching, and pinching it.  He denies fever, chills, cough, shortness of breath, weakness or dizziness.  There are no other known modifying factors.     Past Medical History:  Diagnosis Date  . Asthma   . Opiate abuse, continuous (HCC)    Heroin    Patient Active Problem List   Diagnosis Date Noted  . Encounter to establish care 04/02/2019  . Tooth ache 04/02/2019    History reviewed. No pertinent surgical history.     No family history on file.  Social History   Tobacco Use  . Smoking status: Former Smoker     Packs/day: 0.50  . Smokeless tobacco: Never Used  . Tobacco comment: using nicotine patches  Substance Use Topics  . Alcohol use: Not Currently    Comment: rarely  . Drug use: Not Currently    Types: Marijuana, IV, "Crack" cocaine, Cocaine, Fentanyl, Methamphetamines, Amphetamines    Comment: Heroin    Home Medications Prior to Admission medications   Medication Sig Start Date End Date Taking? Authorizing Provider  doxycycline (VIBRAMYCIN) 100 MG capsule Take 1 capsule (100 mg total) by mouth 2 (two) times daily. One po bid x 7 days 01/10/20   Mancel Bale, MD  naloxone [facility]) 0.4 MG/ML injection Take as needed for accidental opiate overdose 10/07/19   Shaune Pollack, MD  nicotine (NICODERM CQ - DOSED IN MG/24 HOURS) 21 mg/24hr patch Place 1 patch (21 mg total) onto the skin daily. 01/28/19   Aldean Baker, NP    Allergies    Patient has no known allergies.  Review of Systems   Review of Systems  All other systems reviewed and are negative.   Physical Exam Updated Vital Signs BP 129/74   Pulse 98   Temp 97.9 F (36.6 C) (Oral)   Resp 13   Ht 5\' 7"  (1.702 m)   Wt 63.5 kg   SpO2 97%   BMI 21.93 kg/m  Physical Exam Vitals and nursing note reviewed.  Constitutional:      General: He is not in acute distress.    Appearance: He is well-developed. He is not ill-appearing, toxic-appearing or diaphoretic.  HENT:     Head: Normocephalic and atraumatic.     Right Ear: External ear normal.     Left Ear: External ear normal.     Mouth/Throat:     Pharynx: No oropharyngeal exudate or posterior oropharyngeal erythema.  Eyes:     Conjunctiva/sclera: Conjunctivae normal.     Pupils: Pupils are equal, round, and reactive to light.  Neck:     Trachea: Phonation normal.  Cardiovascular:     Rate and Rhythm: Normal rate.  Pulmonary:     Effort: Pulmonary effort is normal.  Musculoskeletal:        General: Normal range of motion.     Cervical back: Normal range of motion  and neck supple.  Skin:    General: Skin is warm and dry.     Comments: Right elbow at the antecubital flexion crease, extending distally, area of induration and erythema, about 4 x 6 cm.  Normal range of motion right elbow.  No proximal streaking.  No fluctuance or drainage, from the elbow lesion.  Left anterior hand dorsal aspect with 3 x 3 x 2 cm raised red indurated and fluctuant area; no proximal streaking.  Normal range of motion fingers of left hand, and left wrist.  Neurological:     Mental Status: He is alert and oriented to person, place, and time.     Cranial Nerves: No cranial nerve deficit.     Sensory: No sensory deficit.     Motor: No abnormal muscle tone.     Coordination: Coordination normal.  Psychiatric:        Mood and Affect: Mood normal.        Behavior: Behavior normal.        Thought Content: Thought content normal.        Judgment: Judgment normal.     ED Results / Procedures / Treatments   Labs (all labs ordered are listed, but only abnormal results are displayed) Labs Reviewed  COMPREHENSIVE METABOLIC PANEL - Abnormal; Notable for the following components:      Result Value   Glucose, Bld 129 (*)    All other components within normal limits  CBC WITH DIFFERENTIAL/PLATELET - Abnormal; Notable for the following components:   WBC 13.9 (*)    Platelets 548 (*)    Lymphs Abs 4.7 (*)    Monocytes Absolute 1.2 (*)    Abs Immature Granulocytes 0.13 (*)    All other components within normal limits  LACTIC ACID, PLASMA  LACTIC ACID, PLASMA    EKG None  Radiology DG Elbow Complete Right  Result Date: 01/10/2020 CLINICAL DATA:  29 year old male with history of open wound on the right elbow. EXAM: RIGHT ELBOW - COMPLETE 3+ VIEW COMPARISON:  No priors. FINDINGS: There is no evidence of fracture, dislocation, or joint effusion. There is no evidence of arthropathy or other focal bone abnormality. Small linear metallic foreign body in the soft tissues adjacent to  the proximal third of the radial diaphysis, likely a fractured needle fragment. IMPRESSION: 1. No acute osseous abnormality. 2. Small needle fragment in the soft tissues adjacent to the proximal third of the radial diaphysis. Electronically Signed   By: Trudie Reed M.D.   On: 01/10/2020 18:46   DG Hand Complete Left  Result Date:  01/10/2020 CLINICAL DATA:  Dorsal left hand swelling. Drug abuse. EXAM: LEFT HAND - COMPLETE 3+ VIEW COMPARISON:  None. FINDINGS: There is no evidence of fracture or dislocation. There is no evidence of arthropathy or other focal bone abnormality. There is soft tissue swelling in the dorsum of the hand. No radiopaque foreign body identified. IMPRESSION: Soft tissue swelling in the left hand without acute osseous abnormality. Electronically Signed   By: Audie Pinto M.D.   On: 01/10/2020 18:40    Procedures .Marland KitchenIncision and Drainage  Date/Time: 01/10/2020 8:12 PM Performed by: Daleen Bo, MD Authorized by: Daleen Bo, MD   Consent:    Consent obtained:  Verbal   Consent given by:  Patient   Risks discussed:  Bleeding, incomplete drainage, pain and infection   Alternatives discussed:  No treatment Location:    Type:  Abscess   Size:  3 cm   Location: Left hand. Pre-procedure details:    Skin preparation:  Betadine Anesthesia (see MAR for exact dosages):    Anesthesia method:  Local infiltration   Local anesthetic:  Lidocaine 1% w/o epi Procedure type:    Complexity:  Simple Procedure details:    Needle aspiration: no     Incision types:  Single straight   Incision depth:  Subcutaneous   Scalpel blade:  11   Wound management:  Probed and deloculated, irrigated with saline and extensive cleaning   Drainage:  Purulent   Drainage amount:  Moderate   Wound treatment:  Wound left open   Packing materials:  None Post-procedure details:    Patient tolerance of procedure:  Tolerated well, no immediate complications   (including critical care  time)  Medications Ordered in ED Medications  doxycycline (VIBRA-TABS) tablet 100 mg (has no administration in time range)  ondansetron (ZOFRAN) injection 4 mg (4 mg Intravenous Given 01/10/20 1737)  sodium chloride 0.9 % bolus 1,000 mL (0 mLs Intravenous Stopped 01/10/20 2003)  lidocaine (PF) (XYLOCAINE) 1 % injection 5 mL (5 mLs Infiltration Given by Other 01/10/20 1950)    ED Course  I have reviewed the triage vital signs and the nursing notes.  Pertinent labs & imaging results that were available during my care of the patient were reviewed by me and considered in my medical decision making (see chart for details).  Clinical Course as of Jan 10 2024  Sun Jan 10, 2020  1729 Normal  Lactic acid, plasma [EW]  2016 Normal except white count high  CBC with Differential(!) [EW]  2016 Normal except glucose high  Comprehensive metabolic panel(!) [EW]  9323 No foreign body or fracture, interpreted by me  DG Elbow Complete Right [EW]  2017 No foreign body, or fracture, interpreted by me  DG Hand Complete Left [EW]    Clinical Course User Index [EW] Daleen Bo, MD   MDM Rules/Calculators/A&P                       Patient Vitals for the past 24 hrs:  BP Temp Temp src Pulse Resp SpO2 Height Weight  01/10/20 1930 129/74 -- -- 98 13 97 % -- --  01/10/20 1900 126/77 -- -- (!) 101 (!) 9 100 % -- --  01/10/20 1845 114/72 -- -- (!) 106 11 100 % -- --  01/10/20 1830 115/69 -- -- (!) 113 13 97 % -- --  01/10/20 1815 117/70 -- -- (!) 110 13 100 % -- --  01/10/20 1800 133/89 -- -- (!) 104 15 100 % -- --  01/10/20 1745 (!) 135/92 -- -- (!) 102 14 100 % -- --  01/10/20 1735 -- 97.9 F (36.6 C) Oral -- -- -- -- --  01/10/20 1733 -- -- -- -- -- -- 5\' 7"  (1.702 m) 63.5 kg  01/10/20 1729 (!) 153/98 -- -- (!) 125 16 99 % -- --    8:14 PM Reevaluation with update and discussion. After initial assessment and treatment, an updated evaluation reveals he remains alert and comfortable, he has not  required additional support for altered mental status or respiratory distress.  Findings discussed with patient and questions answered. 01/12/20   Medical Decision Making: Patient presents to the ED, for infections of right arm and left hand.  He injected heroin, while at triage and had a respiratory depression, and altered mental status requiring treatment with Narcan.  He only required a single dose and recovered.  His left hand abscess was treated with I&D, successfully.  Right arm superficial cellulitis, did not require a drainage procedure.  No evidence for sepsis, or metabolic instability.    CRITICAL CARE-no Performed by: Mancel Bale   Nursing Notes Reviewed/ Care Coordinated Applicable Imaging Reviewed Interpretation of Laboratory Data incorporated into ED treatment  The patient appears reasonably screened and/or stabilized for discharge and I doubt any other medical condition or other [facility] requiring further screening, evaluation, or treatment in the ED at this time prior to discharge.  Plan: Home Medications-OTC analgesia; Home Treatments-avoid heroin, use warm compresses on right arm infection and left hand abscess drainage site; return here if the recommended treatment, does not improve the symptoms; Recommended follow up-PCP, or return here as needed      Final Clinical Impression(s) / ED Diagnoses Final diagnoses:  Accidental overdose of heroin, initial encounter (HCC)  Abscess of left hand  Right arm cellulitis    Rx / DC Orders ED Discharge Orders         Ordered    doxycycline (VIBRAMYCIN) 100 MG capsule  2 times daily     01/10/20 2024           2025, MD 01/10/20 2025

## 2020-01-10 NOTE — ED Triage Notes (Addendum)
Pt presents to the ED for open wound on left AC. Patient found in lobby bathroom blue and foaming at the mouth. Patient reports shooting up heroin, needle seen with patient.  Patient brought to resus immediately unconscious. Patient give 2mg  of narcan IV.

## 2020-01-10 NOTE — ED Notes (Addendum)
This Clinical research associate went out to call the patient for triage. The screeners in the lobby stated that he was in the lobby bathroom.

## 2020-01-10 NOTE — ED Notes (Signed)
Patient has contracted for safety at this time. Patient reports he will not leave or try to get out of bed without assistance. Patient is cooperative and calm at this time. Fluids infusing. Will continue to monitor.

## 2020-01-10 NOTE — ED Notes (Addendum)
This writer went out to check on the patient again and the patient was still in the bathroom. I went into the bathroom to check on him and he stated that he was fine and that his stomach was upset and that he would be done in a minute.

## 2020-01-10 NOTE — ED Notes (Signed)
The screeners went to check on the patient again and came and told us that the patient was not responding to her calls. This Clinical research associate then went to the bathroom again and called the patients name with no response. I opened the bathroom door and found the patient crouched in the corner of the bathroom stall unresponsive. I went and shook the patient became responsive and stood up the patients mouth was blue and was foaming at the mouth. We helped move the patient to a wheelchair and brought him to Res A.

## 2020-02-17 ENCOUNTER — Other Ambulatory Visit: Payer: Self-pay

## 2020-02-17 ENCOUNTER — Emergency Department (HOSPITAL_COMMUNITY)
Admission: EM | Admit: 2020-02-17 | Discharge: 2020-02-18 | Disposition: A | Discharge status: Home / Self Care | Payer: Self-pay | Attending: Emergency Medicine | Admitting: Emergency Medicine

## 2020-02-17 DIAGNOSIS — R2231 Localized swelling, mass and lump, right upper limb: Secondary | ICD-10-CM | POA: Insufficient documentation

## 2020-02-17 DIAGNOSIS — Z5321 Procedure and treatment not carried out due to patient leaving prior to being seen by health care provider: Secondary | ICD-10-CM | POA: Insufficient documentation

## 2020-02-17 NOTE — ED Triage Notes (Signed)
Pt c/o of right arm swelling x 2 days after injecting heroin.pt is looking for rehab

## 2020-02-18 ENCOUNTER — Other Ambulatory Visit: Payer: Self-pay

## 2020-02-18 ENCOUNTER — Emergency Department (HOSPITAL_COMMUNITY): Payer: Self-pay

## 2020-02-18 ENCOUNTER — Emergency Department (HOSPITAL_COMMUNITY)
Admission: EM | Admit: 2020-02-18 | Discharge: 2020-02-18 | Disposition: A | Discharge status: Home / Self Care | Payer: Self-pay | Attending: Emergency Medicine | Admitting: Emergency Medicine

## 2020-02-18 ENCOUNTER — Encounter (HOSPITAL_COMMUNITY): Payer: Self-pay | Admitting: Emergency Medicine

## 2020-02-18 DIAGNOSIS — Z87891 Personal history of nicotine dependence: Secondary | ICD-10-CM | POA: Insufficient documentation

## 2020-02-18 DIAGNOSIS — L03113 Cellulitis of right upper limb: Secondary | ICD-10-CM | POA: Insufficient documentation

## 2020-02-18 DIAGNOSIS — E876 Hypokalemia: Secondary | ICD-10-CM | POA: Insufficient documentation

## 2020-02-18 DIAGNOSIS — J45909 Unspecified asthma, uncomplicated: Secondary | ICD-10-CM | POA: Insufficient documentation

## 2020-02-18 LAB — COMPREHENSIVE METABOLIC PANEL
ALT: 30 U/L (ref 0–44)
AST: 23 U/L (ref 15–41)
Albumin: 3.3 g/dL — ABNORMAL LOW (ref 3.5–5.0)
Alkaline Phosphatase: 102 U/L (ref 38–126)
Anion gap: 12 (ref 5–15)
BUN: 10 mg/dL (ref 6–20)
CO2: 29 mmol/L (ref 22–32)
Calcium: 8.6 mg/dL — ABNORMAL LOW (ref 8.9–10.3)
Chloride: 91 mmol/L — ABNORMAL LOW (ref 98–111)
Creatinine, Ser: 0.81 mg/dL (ref 0.61–1.24)
GFR calc Af Amer: >60 mL/min (ref >60)
GFR calc non Af Amer: >60 mL/min (ref >60)
Glucose, Bld: 103 mg/dL — ABNORMAL HIGH (ref 70–99)
Potassium: 2.9 mmol/L — ABNORMAL LOW (ref 3.5–5.1)
Sodium: 132 mmol/L — ABNORMAL LOW (ref 135–145)
Total Bilirubin: 0.8 mg/dL (ref 0.3–1.2)
Total Protein: 7.6 g/dL (ref 6.5–8.1)

## 2020-02-18 LAB — CBC WITH DIFFERENTIAL/PLATELET
Abs Immature Granulocytes: 0.24 10*3/uL — ABNORMAL HIGH (ref 0.00–0.07)
Basophils Absolute: 0.1 10*3/uL (ref 0.0–0.1)
Basophils Relative: 1 %
Eosinophils Absolute: 0.1 10*3/uL (ref 0.0–0.5)
Eosinophils Relative: 0 %
HCT: 36.3 % — ABNORMAL LOW (ref 39.0–52.0)
Hemoglobin: 12 g/dL — ABNORMAL LOW (ref 13.0–17.0)
Immature Granulocytes: 1 %
Lymphocytes Relative: 13 %
Lymphs Abs: 2.4 10*3/uL (ref 0.7–4.0)
MCH: 28.4 pg (ref 26.0–34.0)
MCHC: 33.1 g/dL (ref 30.0–36.0)
MCV: 86 fL (ref 80.0–100.0)
Monocytes Absolute: 1.2 10*3/uL — ABNORMAL HIGH (ref 0.1–1.0)
Monocytes Relative: 7 %
Neutro Abs: 13.7 10*3/uL — ABNORMAL HIGH (ref 1.7–7.7)
Neutrophils Relative %: 78 %
Platelets: 428 10*3/uL — ABNORMAL HIGH (ref 150–400)
RBC: 4.22 MIL/uL (ref 4.22–5.81)
RDW: 13.6 % (ref 11.5–15.5)
WBC: 17.7 10*3/uL — ABNORMAL HIGH (ref 4.0–10.5)
nRBC: 0 % (ref 0.0–0.2)

## 2020-02-18 LAB — LACTIC ACID, PLASMA: Lactic Acid, Venous: 1.5 mmol/L (ref 0.5–1.9)

## 2020-02-18 LAB — MAGNESIUM: Magnesium: 2 mg/dL (ref 1.7–2.4)

## 2020-02-18 MED ORDER — VANCOMYCIN HCL IN DEXTROSE 1-5 GM/200ML-% IV SOLN
1000.0000 mg | Freq: Once | INTRAVENOUS | Status: AC
  Administered 2020-02-18: 13:00:00 1000 mg via INTRAVENOUS
  Filled 2020-02-18: qty 200

## 2020-02-18 MED ORDER — IOHEXOL 300 MG/ML  SOLN
100.0000 mL | Freq: Once | INTRAMUSCULAR | Status: AC | PRN
  Administered 2020-02-18: 12:00:00 100 mL via INTRAVENOUS

## 2020-02-18 MED ORDER — POTASSIUM CHLORIDE CRYS ER 20 MEQ PO TBCR
40.0000 meq | EXTENDED_RELEASE_TABLET | Freq: Once | ORAL | Status: AC
  Administered 2020-02-18: 40 meq via ORAL
  Filled 2020-02-18: qty 2

## 2020-02-18 MED ORDER — LACTATED RINGERS IV BOLUS
1000.0000 mL | Freq: Once | INTRAVENOUS | Status: AC
  Administered 2020-02-18: 11:00:00 1000 mL via INTRAVENOUS

## 2020-02-18 MED ORDER — POTASSIUM CHLORIDE 10 MEQ/100ML IV SOLN
10.0000 meq | Freq: Once | INTRAVENOUS | Status: AC
  Administered 2020-02-18: 12:00:00 10 meq via INTRAVENOUS
  Filled 2020-02-18: qty 100

## 2020-02-18 MED ORDER — DOXYCYCLINE HYCLATE 100 MG PO CAPS
100.0000 mg | ORAL_CAPSULE | Freq: Two times a day (BID) | ORAL | 0 refills | Status: DC
Start: 2020-02-18 — End: 2020-04-29

## 2020-02-18 MED ORDER — SODIUM CHLORIDE (PF) 0.9 % IJ SOLN
INTRAMUSCULAR | Status: AC
  Filled 2020-02-18: qty 50

## 2020-02-18 MED ORDER — POTASSIUM CHLORIDE CRYS ER 20 MEQ PO TBCR
20.0000 meq | EXTENDED_RELEASE_TABLET | Freq: Two times a day (BID) | ORAL | 0 refills | Status: DC
Start: 2020-02-18 — End: 2020-04-29

## 2020-02-18 NOTE — Progress Notes (Signed)
A consult was received from an ED physician for vanc per pharmacy dosing.  The patient's profile has been reviewed for ht/wt/allergies/indication/available labs.   A one time order has been placed for vanc 1g.  Further antibiotics/pharmacy consults should be ordered by admitting physician if indicated.                       Thank you, Berkley Harvey 02/18/2020  11:36 AM

## 2020-02-18 NOTE — ED Notes (Signed)
Pt called for rm x1 No answer 

## 2020-02-18 NOTE — Discharge Instructions (Addendum)
If you develop fever, vomiting, worsening pain or swelling in your arm, weakness or numbness or tingling in your hand, or any other new/concerning symptoms then return to the ER for evaluation.  On your CT scan there appears to be a small metallic needle fragment in your forearm.  Follow-up with the orthopedic specialist listed to evaluate if this can be removed at a later date.

## 2020-02-18 NOTE — ED Provider Notes (Signed)
[facility]-EMERGENCY DEPT Provider Note   CSN: [id number] Arrival date & time: 02/18/20  8563     History Chief Complaint  Patient presents with  . Arm Pain    Clifford Clark is a 29 y.o. male.  HPI 29 year old male presents with right arm swelling.  States it started over the last 2 days or so.  It is uncomfortable and swollen but not severely painful.  Is red and warm.  No fevers.  This is the arm he uses to use heroin.  Had a previous infection to this arm last month that was incised and then put on antibiotics.  No weakness or numbness in his hand though his hand feels stiff and he has to open and close it to relieve the stiffness.   Past Medical History:  Diagnosis Date  . Asthma   . Opiate abuse, continuous (HCC)    Heroin    Patient Active Problem List   Diagnosis Date Noted  . Encounter to establish care 04/02/2019  . Tooth ache 04/02/2019    History reviewed. No pertinent surgical history.     No family history on file.  Social History   Tobacco Use  . Smoking status: Former Smoker    Packs/day: 0.50  . Smokeless tobacco: Never Used  . Tobacco comment: using nicotine patches  Substance Use Topics  . Alcohol use: Not Currently    Comment: rarely  . Drug use: Not Currently    Types: Marijuana, IV, "Crack" cocaine, Cocaine, Fentanyl, Methamphetamines, Amphetamines    Comment: Heroin    Home Medications Prior to Admission medications   Medication Sig Start Date End Date Taking? Authorizing Provider  naloxone [facility]) 0.4 MG/ML injection Take as needed for accidental opiate overdose Patient taking differently: Place 0.4 mg into the nose as needed (overdose). Take as needed for accidental opiate overdose 10/07/19  Yes Shaune Pollack, MD  doxycycline (VIBRAMYCIN) 100 MG capsule Take 1 capsule (100 mg total) by mouth 2 (two) times daily. One po bid x 7 days 02/18/20   Pricilla Loveless, MD  nicotine (NICODERM CQ - DOSED IN MG/24 HOURS) 21  mg/24hr patch Place 1 patch (21 mg total) onto the skin daily. Patient not taking: Reported on 02/18/2020 01/28/19   Aldean Baker, NP  potassium chloride SA (KLOR-CON) 20 MEQ tablet Take 1 tablet (20 mEq total) by mouth 2 (two) times daily for 5 days. 02/18/20 02/23/20  Pricilla Loveless, MD    Allergies    Patient has no known allergies.  Review of Systems   Review of Systems  Constitutional: Negative for fever.  Musculoskeletal: Positive for myalgias.  Skin: Positive for color change. Negative for wound.  All other systems reviewed and are negative.   Physical Exam Updated Vital Signs BP 103/63   Pulse 95   Temp 98.1 F (36.7 C) (Oral)   Resp 16   SpO2 100%   Physical Exam Vitals and nursing note reviewed.  Constitutional:      General: He is not in acute distress.    Appearance: He is well-developed. He is not ill-appearing or diaphoretic.  HENT:     Head: Normocephalic and atraumatic.     Right Ear: External ear normal.     Left Ear: External ear normal.     Nose: Nose normal.  Eyes:     General:        Right eye: No discharge.        Left eye: No discharge.  Cardiovascular:  Rate and Rhythm: Normal rate and regular rhythm.     Pulses:          Radial pulses are 2+ on the right side.     Heart sounds: Normal heart sounds.  Pulmonary:     Effort: Pulmonary effort is normal.     Breath sounds: Normal breath sounds.  Abdominal:     Palpations: Abdomen is soft.     Tenderness: There is no abdominal tenderness.  Musculoskeletal:     Right elbow: Normal range of motion. No tenderness.     Right forearm: Swelling and tenderness present. No deformity.     Cervical back: Neck supple.     Comments: Diffuse swelling to right forearm. On the ulnar side there is significant erythema and edema.  Normal radial, ulnar, median nerve testing in right hand Normal color and warmth in right hand compared to left.  Skin:    General: Skin is warm and dry.  Neurological:      Mental Status: He is alert.  Psychiatric:        Mood and Affect: Mood is not anxious.     ED Results / Procedures / Treatments   Labs (all labs ordered are listed, but only abnormal results are displayed) Labs Reviewed  COMPREHENSIVE METABOLIC PANEL - Abnormal; Notable for the following components:      Result Value   Sodium 132 (*)    Potassium 2.9 (*)    Chloride 91 (*)    Glucose, Bld 103 (*)    Calcium 8.6 (*)    Albumin 3.3 (*)    All other components within normal limits  CBC WITH DIFFERENTIAL/PLATELET - Abnormal; Notable for the following components:   WBC 17.7 (*)    Hemoglobin 12.0 (*)    HCT 36.3 (*)    Platelets 428 (*)    Neutro Abs 13.7 (*)    Monocytes Absolute 1.2 (*)    Abs Immature Granulocytes 0.24 (*)    All other components within normal limits  LACTIC ACID, PLASMA  MAGNESIUM    EKG None  Radiology CT FOREARM RIGHT W CONTRAST  Result Date: 02/18/2020 CLINICAL DATA:  Erysipelas. Right arm cellulitis due to IV drug use. EXAM: CT OF THE UPPER RIGHT EXTREMITY WITH CONTRAST TECHNIQUE: Multidetector CT imaging of the upper right extremity was performed according to the standard protocol following intravenous contrast administration. COMPARISON:  Radiographs dated 01/10/2020 CONTRAST:  OMNIPAQUE IOHEXOL 300 MG/ML  SOLN FINDINGS: Bones/Joint/Cartilage The radius and ulna appear normal. No wrist or elbow effusions. The bones of the elbow and wrist appear normal. Muscles and Tendons There is no discrete intramuscular abscess or definable myositis. Soft tissues There is circumferential subcutaneous edema in the forearm, most prominent lateral to the radial head seen on image 136 of series 4. No definable subcutaneous abscesses. There is a small metallic needle fragment in the dorsal musculature of the proximal forearm as demonstrated on prior radiographs dated 01/10/2020. IMPRESSION: 1. Circumferential subcutaneous edema in the forearm, most prominent lateral to  the radial head consistent with cellulitis. 2. No discrete abscess or definable myositis. No osteomyelitis or joint effusions. 3. Small metallic needle fragment in the dorsal musculature of the proximal forearm as demonstrated on prior radiographs dated 01/10/2020. Electronically Signed   By: Francene Boyers M.D.   On: 02/18/2020 13:09    Procedures Procedures (including critical care time)  Medications Ordered in ED Medications  sodium chloride (PF) 0.9 % injection (has no administration in time range)  lactated ringers bolus 1,000 mL (0 mLs Intravenous Stopped 02/18/20 1229)  potassium chloride SA (KLOR-CON) CR tablet 40 mEq (40 mEq Oral Given 02/18/20 1134)  potassium chloride 10 mEq in 100 mL IVPB (0 mEq Intravenous Stopped 02/18/20 1422)  vancomycin (VANCOCIN) IVPB 1000 mg/200 mL premix (0 mg Intravenous Stopped 02/18/20 1422)  iohexol (OMNIPAQUE) 300 MG/ML solution 100 mL (100 mLs Intravenous Contrast Given 02/18/20 1152)    ED Course  I have reviewed the triage vital signs and the nursing notes.  Pertinent labs & imaging results that were available during my care of the patient were reviewed by me and considered in my medical decision making (see chart for details).    MDM Rules/Calculators/A&P                      Patient's labs have been personally reviewed and show normal lactic acid but does have leukocytosis and hypokalemia.  Given the impressive amount of swelling, CT was obtained but does not show any obvious drainable abscess.  These images have been personally reviewed.  There is a persistent metal foreign body consistent with broken needle.  However this is not superficial enough to remove right now.  I discussed with Hilbert Odor of orthopedics who recommends outpatient hand surgery follow-up when cellulitis has improved.  While he does have circumferential swelling, there is some pitting edema and this area is soft.  No paresthesias or pain out of proportion to suggest a  compartment syndrome.  Advised that he elevate this extremity.  We will give antibiotics and he was given a dose of IV vancomycin here.  Discussed strict return precautions and he seems to understand. Final Clinical Impression(s) / ED Diagnoses Final diagnoses:  Right forearm cellulitis  Hypokalemia    Rx / DC Orders ED Discharge Orders         Ordered    doxycycline (VIBRAMYCIN) 100 MG capsule  2 times daily     02/18/20 1414    potassium chloride SA (KLOR-CON) 20 MEQ tablet  2 times daily     02/18/20 1414           Sherwood Gambler, MD 02/18/20 1526

## 2020-02-18 NOTE — ED Triage Notes (Signed)
Per pt, states right arm cellulitis due to IV drug use-states he wants to get into rehab-patient falling asleep during triage-states last use was around 2am-right arm red, swollen

## 2020-03-23 ENCOUNTER — Ambulatory Visit: Payer: Self-pay

## 2020-04-28 ENCOUNTER — Emergency Department (HOSPITAL_COMMUNITY): Payer: Self-pay

## 2020-04-28 ENCOUNTER — Inpatient Hospital Stay (HOSPITAL_COMMUNITY)
Admission: EM | Admit: 2020-04-28 | Discharge: 2020-06-17 | DRG: 853 | Disposition: A | Discharge status: Home / Self Care | Payer: Self-pay | Attending: Internal Medicine | Admitting: Internal Medicine

## 2020-04-28 ENCOUNTER — Encounter (HOSPITAL_COMMUNITY): Payer: Self-pay

## 2020-04-28 DIAGNOSIS — J15212 Pneumonia due to Methicillin resistant Staphylococcus aureus: Secondary | ICD-10-CM | POA: Diagnosis present

## 2020-04-28 DIAGNOSIS — R45851 Suicidal ideations: Secondary | ICD-10-CM | POA: Diagnosis present

## 2020-04-28 DIAGNOSIS — Z4659 Encounter for fitting and adjustment of other gastrointestinal appliance and device: Secondary | ICD-10-CM

## 2020-04-28 DIAGNOSIS — L02512 Cutaneous abscess of left hand: Secondary | ICD-10-CM | POA: Diagnosis present

## 2020-04-28 DIAGNOSIS — I2690 Septic pulmonary embolism without acute cor pulmonale: Secondary | ICD-10-CM | POA: Diagnosis present

## 2020-04-28 DIAGNOSIS — L02519 Cutaneous abscess of unspecified hand: Secondary | ICD-10-CM

## 2020-04-28 DIAGNOSIS — I079 Rheumatic tricuspid valve disease, unspecified: Secondary | ICD-10-CM

## 2020-04-28 DIAGNOSIS — D649 Anemia, unspecified: Secondary | ICD-10-CM | POA: Diagnosis present

## 2020-04-28 DIAGNOSIS — I314 Cardiac tamponade: Secondary | ICD-10-CM | POA: Diagnosis present

## 2020-04-28 DIAGNOSIS — Z20822 Contact with and (suspected) exposure to covid-19: Secondary | ICD-10-CM | POA: Diagnosis present

## 2020-04-28 DIAGNOSIS — Z452 Encounter for adjustment and management of vascular access device: Secondary | ICD-10-CM

## 2020-04-28 DIAGNOSIS — Z5900 Homelessness unspecified: Secondary | ICD-10-CM | POA: Insufficient documentation

## 2020-04-28 DIAGNOSIS — R6521 Severe sepsis with septic shock: Secondary | ICD-10-CM | POA: Diagnosis not present

## 2020-04-28 DIAGNOSIS — G9340 Encephalopathy, unspecified: Secondary | ICD-10-CM

## 2020-04-28 DIAGNOSIS — K083 Retained dental root: Secondary | ICD-10-CM | POA: Diagnosis present

## 2020-04-28 DIAGNOSIS — Z560 Unemployment, unspecified: Secondary | ICD-10-CM

## 2020-04-28 DIAGNOSIS — E440 Moderate protein-calorie malnutrition: Secondary | ICD-10-CM | POA: Insufficient documentation

## 2020-04-28 DIAGNOSIS — Q211 Atrial septal defect: Secondary | ICD-10-CM

## 2020-04-28 DIAGNOSIS — K029 Dental caries, unspecified: Secondary | ICD-10-CM | POA: Diagnosis present

## 2020-04-28 DIAGNOSIS — R799 Abnormal finding of blood chemistry, unspecified: Secondary | ICD-10-CM

## 2020-04-28 DIAGNOSIS — Z681 Body mass index (BMI) 19 or less, adult: Secondary | ICD-10-CM

## 2020-04-28 DIAGNOSIS — D6959 Other secondary thrombocytopenia: Secondary | ICD-10-CM | POA: Diagnosis present

## 2020-04-28 DIAGNOSIS — J9690 Respiratory failure, unspecified, unspecified whether with hypoxia or hypercapnia: Secondary | ICD-10-CM

## 2020-04-28 DIAGNOSIS — J9600 Acute respiratory failure, unspecified whether with hypoxia or hypercapnia: Secondary | ICD-10-CM

## 2020-04-28 DIAGNOSIS — J181 Lobar pneumonia, unspecified organism: Secondary | ICD-10-CM

## 2020-04-28 DIAGNOSIS — N179 Acute kidney failure, unspecified: Secondary | ICD-10-CM | POA: Diagnosis not present

## 2020-04-28 DIAGNOSIS — D696 Thrombocytopenia, unspecified: Secondary | ICD-10-CM

## 2020-04-28 DIAGNOSIS — N280 Ischemia and infarction of kidney: Secondary | ICD-10-CM | POA: Diagnosis present

## 2020-04-28 DIAGNOSIS — E876 Hypokalemia: Secondary | ICD-10-CM | POA: Diagnosis present

## 2020-04-28 DIAGNOSIS — F1910 Other psychoactive substance abuse, uncomplicated: Secondary | ICD-10-CM

## 2020-04-28 DIAGNOSIS — L89152 Pressure ulcer of sacral region, stage 2: Secondary | ICD-10-CM | POA: Diagnosis not present

## 2020-04-28 DIAGNOSIS — F1510 Other stimulant abuse, uncomplicated: Secondary | ICD-10-CM | POA: Diagnosis present

## 2020-04-28 DIAGNOSIS — Z87891 Personal history of nicotine dependence: Secondary | ICD-10-CM

## 2020-04-28 DIAGNOSIS — Z978 Presence of other specified devices: Secondary | ICD-10-CM

## 2020-04-28 DIAGNOSIS — G9341 Metabolic encephalopathy: Secondary | ICD-10-CM | POA: Diagnosis present

## 2020-04-28 DIAGNOSIS — B182 Chronic viral hepatitis C: Secondary | ICD-10-CM | POA: Diagnosis present

## 2020-04-28 DIAGNOSIS — F1121 Opioid dependence, in remission: Secondary | ICD-10-CM

## 2020-04-28 DIAGNOSIS — F1410 Cocaine abuse, uncomplicated: Secondary | ICD-10-CM | POA: Diagnosis present

## 2020-04-28 DIAGNOSIS — L8990 Pressure ulcer of unspecified site, unspecified stage: Secondary | ICD-10-CM | POA: Insufficient documentation

## 2020-04-28 DIAGNOSIS — A4102 Sepsis due to Methicillin resistant Staphylococcus aureus: Principal | ICD-10-CM | POA: Diagnosis present

## 2020-04-28 DIAGNOSIS — R0602 Shortness of breath: Secondary | ICD-10-CM

## 2020-04-28 DIAGNOSIS — R7881 Bacteremia: Secondary | ICD-10-CM

## 2020-04-28 DIAGNOSIS — J851 Abscess of lung with pneumonia: Secondary | ICD-10-CM | POA: Diagnosis present

## 2020-04-28 DIAGNOSIS — F1120 Opioid dependence, uncomplicated: Secondary | ICD-10-CM | POA: Diagnosis present

## 2020-04-28 DIAGNOSIS — I513 Intracardiac thrombosis, not elsewhere classified: Secondary | ICD-10-CM | POA: Diagnosis present

## 2020-04-28 DIAGNOSIS — I5021 Acute systolic (congestive) heart failure: Secondary | ICD-10-CM

## 2020-04-28 DIAGNOSIS — R7989 Other specified abnormal findings of blood chemistry: Secondary | ICD-10-CM

## 2020-04-28 DIAGNOSIS — I38 Endocarditis, valve unspecified: Secondary | ICD-10-CM

## 2020-04-28 DIAGNOSIS — I76 Septic arterial embolism: Secondary | ICD-10-CM

## 2020-04-28 DIAGNOSIS — R6520 Severe sepsis without septic shock: Secondary | ICD-10-CM

## 2020-04-28 DIAGNOSIS — I313 Pericardial effusion (noninflammatory): Secondary | ICD-10-CM | POA: Diagnosis present

## 2020-04-28 DIAGNOSIS — E871 Hypo-osmolality and hyponatremia: Secondary | ICD-10-CM | POA: Diagnosis present

## 2020-04-28 DIAGNOSIS — I429 Cardiomyopathy, unspecified: Secondary | ICD-10-CM | POA: Diagnosis present

## 2020-04-28 DIAGNOSIS — R45850 Homicidal ideations: Secondary | ICD-10-CM | POA: Diagnosis present

## 2020-04-28 DIAGNOSIS — K045 Chronic apical periodontitis: Secondary | ICD-10-CM | POA: Diagnosis present

## 2020-04-28 DIAGNOSIS — R160 Hepatomegaly, not elsewhere classified: Secondary | ICD-10-CM | POA: Diagnosis present

## 2020-04-28 DIAGNOSIS — R4689 Other symptoms and signs involving appearance and behavior: Secondary | ICD-10-CM

## 2020-04-28 DIAGNOSIS — R079 Chest pain, unspecified: Secondary | ICD-10-CM

## 2020-04-28 DIAGNOSIS — J9601 Acute respiratory failure with hypoxia: Secondary | ICD-10-CM | POA: Diagnosis not present

## 2020-04-28 DIAGNOSIS — A419 Sepsis, unspecified organism: Secondary | ICD-10-CM

## 2020-04-28 DIAGNOSIS — E611 Iron deficiency: Secondary | ICD-10-CM | POA: Diagnosis present

## 2020-04-28 DIAGNOSIS — L02413 Cutaneous abscess of right upper limb: Secondary | ICD-10-CM | POA: Diagnosis present

## 2020-04-28 DIAGNOSIS — I330 Acute and subacute infective endocarditis: Secondary | ICD-10-CM | POA: Diagnosis present

## 2020-04-28 DIAGNOSIS — M264 Malocclusion, unspecified: Secondary | ICD-10-CM | POA: Diagnosis present

## 2020-04-28 LAB — APTT: aPTT: 28 seconds (ref 24–36)

## 2020-04-28 LAB — URINALYSIS, ROUTINE W REFLEX MICROSCOPIC
Bacteria, UA: NONE SEEN
Bilirubin Urine: NEGATIVE
Glucose, UA: NEGATIVE mg/dL
Ketones, ur: NEGATIVE mg/dL
Nitrite: NEGATIVE
Protein, ur: 30 mg/dL — AB
Specific Gravity, Urine: 1.019 (ref 1.005–1.030)
pH: 5 (ref 5.0–8.0)

## 2020-04-28 LAB — PROTIME-INR
INR: 1.4 — ABNORMAL HIGH (ref 0.8–1.2)
Prothrombin Time: 16.9 seconds — ABNORMAL HIGH (ref 11.4–15.2)

## 2020-04-28 LAB — CBC WITH DIFFERENTIAL/PLATELET
Abs Immature Granulocytes: 1.22 10*3/uL — ABNORMAL HIGH (ref 0.00–0.07)
Basophils Absolute: 0.2 10*3/uL — ABNORMAL HIGH (ref 0.0–0.1)
Basophils Relative: 1 %
Eosinophils Absolute: 0 10*3/uL (ref 0.0–0.5)
Eosinophils Relative: 0 %
HCT: 38.5 % — ABNORMAL LOW (ref 39.0–52.0)
Hemoglobin: 13.3 g/dL (ref 13.0–17.0)
Immature Granulocytes: 6 %
Lymphocytes Relative: 6 %
Lymphs Abs: 1.2 10*3/uL (ref 0.7–4.0)
MCH: 28.5 pg (ref 26.0–34.0)
MCHC: 34.5 g/dL (ref 30.0–36.0)
MCV: 82.4 fL (ref 80.0–100.0)
Monocytes Absolute: 1.4 10*3/uL — ABNORMAL HIGH (ref 0.1–1.0)
Monocytes Relative: 6 %
Neutro Abs: 17.5 10*3/uL — ABNORMAL HIGH (ref 1.7–7.7)
Neutrophils Relative %: 81 %
Platelets: 91 10*3/uL — ABNORMAL LOW (ref 150–400)
RBC: 4.67 MIL/uL (ref 4.22–5.81)
RDW: 15 % (ref 11.5–15.5)
WBC: 21.5 10*3/uL — ABNORMAL HIGH (ref 4.0–10.5)
nRBC: 0 % (ref 0.0–0.2)

## 2020-04-28 LAB — COMPREHENSIVE METABOLIC PANEL
ALT: 42 U/L (ref 0–44)
AST: 77 U/L — ABNORMAL HIGH (ref 15–41)
Albumin: 2.2 g/dL — ABNORMAL LOW (ref 3.5–5.0)
Alkaline Phosphatase: 134 U/L — ABNORMAL HIGH (ref 38–126)
Anion gap: 15 (ref 5–15)
BUN: 33 mg/dL — ABNORMAL HIGH (ref 6–20)
CO2: 26 mmol/L (ref 22–32)
Calcium: 7.7 mg/dL — ABNORMAL LOW (ref 8.9–10.3)
Chloride: 91 mmol/L — ABNORMAL LOW (ref 98–111)
Creatinine, Ser: 0.78 mg/dL (ref 0.61–1.24)
GFR calc Af Amer: >60 mL/min (ref >60)
GFR calc non Af Amer: >60 mL/min (ref >60)
Glucose, Bld: 113 mg/dL — ABNORMAL HIGH (ref 70–99)
Potassium: 3.3 mmol/L — ABNORMAL LOW (ref 3.5–5.1)
Sodium: 132 mmol/L — ABNORMAL LOW (ref 135–145)
Total Bilirubin: 1.5 mg/dL — ABNORMAL HIGH (ref 0.3–1.2)
Total Protein: 7.1 g/dL (ref 6.5–8.1)

## 2020-04-28 LAB — BLOOD GAS, VENOUS
Acid-Base Excess: 3.7 mmol/L — ABNORMAL HIGH (ref 0.0–2.0)
Bicarbonate: 27.5 mmol/L (ref 20.0–28.0)
O2 Saturation: 43.5 %
Patient temperature: 98.6
pCO2, Ven: 40 mmHg — ABNORMAL LOW (ref 44.0–60.0)
pH, Ven: 7.451 — ABNORMAL HIGH (ref 7.250–7.430)
pO2, Ven: <31 mmHg — CL (ref 32.0–45.0)

## 2020-04-28 LAB — RAPID URINE DRUG SCREEN, HOSP PERFORMED
Amphetamines: NOT DETECTED
Barbiturates: NOT DETECTED
Benzodiazepines: NOT DETECTED
Cocaine: NOT DETECTED
Opiates: NOT DETECTED
Tetrahydrocannabinol: NOT DETECTED

## 2020-04-28 LAB — LACTIC ACID, PLASMA
Lactic Acid, Venous: 2.1 mmol/L (ref 0.5–1.9)
Lactic Acid, Venous: 2.3 mmol/L (ref 0.5–1.9)
Lactic Acid, Venous: 1.7 mmol/L (ref 0.5–1.9)

## 2020-04-28 LAB — ACETAMINOPHEN LEVEL: Acetaminophen (Tylenol), Serum: <10 ug/mL — ABNORMAL LOW (ref 10–30)

## 2020-04-28 LAB — CREATININE, URINE, RANDOM: Creatinine, Urine: 88.59 mg/dL

## 2020-04-28 LAB — RAPID HIV SCREEN (HIV 1/2 AB+AG)
HIV 1/2 Antibodies: NONREACTIVE
HIV-1 P24 Antigen - HIV24: NONREACTIVE

## 2020-04-28 LAB — SALICYLATE LEVEL: Salicylate Lvl: <7 mg/dL — ABNORMAL LOW (ref 7.0–30.0)

## 2020-04-28 LAB — SEDIMENTATION RATE: Sed Rate: 41 mm/hr — ABNORMAL HIGH (ref 0–16)

## 2020-04-28 LAB — ETHANOL: Alcohol, Ethyl (B): <10 mg/dL (ref <10)

## 2020-04-28 LAB — LIPASE, BLOOD: Lipase: 33 U/L (ref 11–51)

## 2020-04-28 LAB — MRSA PCR SCREENING: MRSA by PCR: POSITIVE — AB

## 2020-04-28 LAB — MAGNESIUM: Magnesium: 2.9 mg/dL — ABNORMAL HIGH (ref 1.7–2.4)

## 2020-04-28 LAB — PROCALCITONIN: Procalcitonin: 7.18 ng/mL

## 2020-04-28 LAB — AMMONIA: Ammonia: 12 umol/L (ref 9–35)

## 2020-04-28 LAB — SODIUM, URINE, RANDOM: Sodium, Ur: <10 mmol/L

## 2020-04-28 LAB — OSMOLALITY, URINE: Osmolality, Ur: 624 mOsm/kg (ref 300–900)

## 2020-04-28 LAB — PHOSPHORUS: Phosphorus: 4.5 mg/dL (ref 2.5–4.6)

## 2020-04-28 LAB — CK: Total CK: 268 U/L (ref 49–397)

## 2020-04-28 LAB — SARS CORONAVIRUS 2 BY RT PCR (HOSPITAL ORDER, PERFORMED IN ~~LOC~~ HOSPITAL LAB): SARS Coronavirus 2: NEGATIVE

## 2020-04-28 MED ORDER — SODIUM CHLORIDE 0.9 % IV BOLUS
1000.0000 mL | Freq: Once | INTRAVENOUS | Status: AC
  Administered 2020-04-28: 1000 mL via INTRAVENOUS

## 2020-04-28 MED ORDER — SODIUM CHLORIDE 0.9 % IV SOLN
2.0000 g | Freq: Once | INTRAVENOUS | Status: AC
  Administered 2020-04-28: 2 g via INTRAVENOUS
  Filled 2020-04-28 (×2): qty 2

## 2020-04-28 MED ORDER — ONDANSETRON HCL 4 MG PO TABS: 4.0000 mg | ORAL_TABLET | Freq: Four times a day (QID) | ORAL | Status: DC | PRN

## 2020-04-28 MED ORDER — SODIUM CHLORIDE 0.9 % IV SOLN
2.0000 g | Freq: Three times a day (TID) | INTRAVENOUS | Status: DC
  Administered 2020-04-29: 2 g via INTRAVENOUS
  Filled 2020-04-28: qty 2

## 2020-04-28 MED ORDER — VANCOMYCIN HCL IN DEXTROSE 1-5 GM/200ML-% IV SOLN
1000.0000 mg | Freq: Once | INTRAVENOUS | Status: AC
  Administered 2020-04-28: 1000 mg via INTRAVENOUS
  Filled 2020-04-28: qty 200

## 2020-04-28 MED ORDER — METRONIDAZOLE IN NACL 5-0.79 MG/ML-% IV SOLN
500.0000 mg | Freq: Three times a day (TID) | INTRAVENOUS | Status: DC
  Administered 2020-04-29: 500 mg via INTRAVENOUS
  Filled 2020-04-28: qty 100

## 2020-04-28 MED ORDER — ONDANSETRON HCL 4 MG/2ML IJ SOLN
4.0000 mg | Freq: Four times a day (QID) | INTRAMUSCULAR | Status: DC | PRN
  Administered 2020-05-04: 4 mg via INTRAVENOUS
  Filled 2020-04-28: qty 2

## 2020-04-28 MED ORDER — SODIUM CHLORIDE 0.9% FLUSH
3.0000 mL | Freq: Two times a day (BID) | INTRAVENOUS | Status: DC
  Administered 2020-04-29 – 2020-06-04 (×51): 3 mL via INTRAVENOUS

## 2020-04-28 MED ORDER — ACETAMINOPHEN 650 MG RE SUPP
650.0000 mg | Freq: Four times a day (QID) | RECTAL | Status: DC | PRN
  Administered 2020-05-18: 650 mg via RECTAL
  Filled 2020-04-28: qty 1

## 2020-04-28 MED ORDER — ACETAMINOPHEN 325 MG PO TABS
650.0000 mg | ORAL_TABLET | Freq: Once | ORAL | Status: AC
  Administered 2020-04-28: 650 mg via ORAL
  Filled 2020-04-28: qty 2

## 2020-04-28 MED ORDER — DOCUSATE SODIUM 100 MG PO CAPS
100.0000 mg | ORAL_CAPSULE | Freq: Two times a day (BID) | ORAL | Status: DC
  Administered 2020-04-29 – 2020-05-13 (×19): 100 mg via ORAL
  Filled 2020-04-28 (×22): qty 1

## 2020-04-28 MED ORDER — METRONIDAZOLE IN NACL 5-0.79 MG/ML-% IV SOLN
500.0000 mg | Freq: Once | INTRAVENOUS | Status: AC
  Administered 2020-04-28: 500 mg via INTRAVENOUS
  Filled 2020-04-28: qty 100

## 2020-04-28 MED ORDER — ACETAMINOPHEN 325 MG PO TABS
650.0000 mg | ORAL_TABLET | Freq: Four times a day (QID) | ORAL | Status: DC | PRN
  Administered 2020-04-29 – 2020-06-16 (×12): 650 mg via ORAL
  Filled 2020-04-28 (×13): qty 2

## 2020-04-28 MED ORDER — POTASSIUM CHLORIDE 10 MEQ/100ML IV SOLN
10.0000 meq | INTRAVENOUS | Status: AC
  Administered 2020-04-28 (×2): 10 meq via INTRAVENOUS
  Filled 2020-04-28 (×2): qty 100

## 2020-04-28 MED ORDER — SODIUM CHLORIDE 0.9 % IV SOLN: INTRAVENOUS | Status: DC

## 2020-04-28 MED ORDER — ORAL CARE MOUTH RINSE
15.0000 mL | Freq: Two times a day (BID) | OROMUCOSAL | Status: DC
  Administered 2020-04-30 – 2020-05-11 (×7): 15 mL via OROMUCOSAL

## 2020-04-28 MED ORDER — VANCOMYCIN HCL IN DEXTROSE 1-5 GM/200ML-% IV SOLN
1000.0000 mg | Freq: Three times a day (TID) | INTRAVENOUS | Status: DC
  Administered 2020-04-29: 1000 mg via INTRAVENOUS
  Filled 2020-04-28 (×2): qty 200

## 2020-04-28 MED ORDER — CHLORHEXIDINE GLUCONATE CLOTH 2 % EX PADS
6.0000 | MEDICATED_PAD | Freq: Every day | CUTANEOUS | Status: DC
  Administered 2020-05-01 – 2020-05-13 (×12): 6 via TOPICAL

## 2020-04-28 MED ORDER — CHLORHEXIDINE GLUCONATE CLOTH 2 % EX PADS
6.0000 | MEDICATED_PAD | Freq: Every day | CUTANEOUS | Status: DC
  Administered 2020-04-28: 6 via TOPICAL

## 2020-04-28 MED ORDER — SODIUM CHLORIDE (PF) 0.9 % IJ SOLN
INTRAMUSCULAR | Status: AC
  Filled 2020-04-28: qty 50

## 2020-04-28 MED ORDER — IOHEXOL 300 MG/ML  SOLN
100.0000 mL | Freq: Once | INTRAMUSCULAR | Status: AC | PRN
  Administered 2020-04-28: 100 mL via INTRAVENOUS

## 2020-04-28 NOTE — Progress Notes (Signed)
04/28/2020  1545  Labs drawn. 1st set of blood cultures Lt AC, purple, gold, gray on ice, light green, and dark green tubes all sent to main lab. Called lab to come draw 2nd set of blood cultures.

## 2020-04-28 NOTE — H&P (Signed)
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 endocarditis  -We will rehydrate, treat with IV antibiotics, follow lactic acid - Await results of blood and urine culture and adjust antibiotics as needed - Obtain MRSA serologies     . Acute metabolic encephalopathy -likely multifactorial in the setting of an infection and drug use. Improving slightly with rehydration we will continue to follow check ammonia level and VBG ( no hypercarbia) CT head unremarkable If persist may need further imaging given multiple septic emboli  . Thrombocytopenia (HCC) -suspect possible underlying liver disease given elevated LFTs low albumin and elevated INR also evidence of splenomegaly.  Will need to be further investigated  . Hypokalemia -replace check magnesium level   Malnutrition -assess patient for malnutrition given diminished p.o. intake evidence of electrolyte  abnormalities.  Will check magnesium and phosphate levels order nutritional consult and check prealbumin   . Hyponatremia -multifactorial suspect underlying liver disease obtain urine electrolytes and follow check TSH.  May need gentle rehydration as patient was found on the street   . Elevated LFTs - patient history of IV drug use check for hepatitis,, check right upper quadrant ultrasound to evaluate for any liver disease  . IVDU (intravenous drug user) -once patient more awake will need to discuss importance of quitting.  Will order transitional care consult   . Septic embolism (HCC) -initiate broad-spectrum antibiotics after blood cultures were obtained order echogram in the morning may need cardiology consult in the future if needs TEE    Other plan as per orders.  DVT prophylaxis:  SCD     Code Status:    Code Status: Prior FULL CODE     Family Communication:   Family not at  Bedside    Disposition Plan:     To home once workup is complete and patient is stable   Following barriers for discharge:                            Electrolytes corrected                                                         Pain controlled with PO medications                               Afebrile, white count improving able to transition to PO antibiotics                             Will need to be able to tolerate PO                            Will likely need home health,                             Will need consultants to evaluate patient prior  to discharge                                       Transition of care consulted                                     Consults called:    ID discussed with Dr. Luciana Axeomer  Admission status:  ED Disposition    ED Disposition Condition Comment   Admit  [facility] [[id number]]  Level of Care: Stepdown [14]  Admit to SDU based on following criteria: Hemodynamic compromise or significant risk of instability:  Patient requiring  short term acute titration and management of vasoactive drips, and invasive monitoring (i.e., CVP and Arterial line).  [facility] if equivalent level of care is available:: No  Covid Evaluation: Symptomatic Person Under Investigation (PUI)  Diagnosis: Sepsis [facility](HCC) [[id number]]) [[id number]]  Admitting Physician: Therisa DoyneUTOVA, Talullah Abate [[id number]]  Attending Physician: Therisa DoyneUTOVA, Tyberius Ryner [[id number]]  Estimated length of stay: 3 - 4 days  Certification:: I certify this patient will need inpatient services for at least 2 midnights          inpatient     I Expect 2 midnight stay secondary to severity of patient's current illness need for inpatient interventions justified by the following:  hemodynamic instability despite optimal treatment (tachycardia tachypnea  hypoxia, )  Severe lab/radiological/exam abnormalities including:    Leukocytosis multiple emboli  and extensive comorbidities including:  substance abuse    That are currently affecting medical management.   I expect  patient to be hospitalized for 2 midnights requiring inpatient medical care.  Patient is at high risk for adverse outcome (such as loss of life or disability) if not treated.  Indication for inpatient stay as follows:  Severe change from baseline regarding mental status Hemodynamic instability despite maximal medical therapy,    inability to maintain oral hydration    Need for operative/procedural  intervention New or worsening hypoxia  Need for IV antibiotics, IV fluids,      Level of care     SDU tele indefinitely please discontinue once patient no longer qualifies COVID-19 Labs  No results found for: [id number]   Precautions: admitted as  PUI     PPE: Used by the provider:   P100  eye Goggles,  Gloves  gown     Janki Dike 04/28/2020, 8:42 PM    [facility]     after 2 AM please page floor coverage PA If 7AM-7PM, please contact the day team taking care of the  patient using [email or web address]   Patient was evaluated in the context of the global COVID-19 pandemic, which necessitated consideration that the patient might be at risk for infection with the SARS-CoV-2 virus that causes COVID-19. Institutional protocols and algorithms that pertain to the evaluation of patients at risk for COVID-19 are in a state of rapid change based on information released by regulatory bodies including the CDC and federal and state organizations. These policies and algorithms were followed during the patient's care.

## 2020-04-28 NOTE — Progress Notes (Signed)
Pharmacy Antibiotic Note  Clifford Clark is a 29 y.o. male admitted on 04/28/2020 with sepsis.  Pharmacy has been consulted for vancomycin dosing.  Pt has a history of IVDU, admitted with sepsis and possible endocarditis.   Today, 04/28/20  WBC 21.5  Ht/wt order entered. Most recent wt = 68 kg on 02/17/20. Using this weight for empiric dosing  Lactate 2.3  SCr 0.8, CrCl > 100 mL/min  Plan:  Cefepime 2 g IV q8h  Vancomycin 1000 mg IV q8h  Metronidazole 500 mg IV q8h per MD  Follow renal function and culture data. Goal VT 15-20 mcg/mL     Temp (24hrs), Avg:100.4 F (38 C), Min:98.5 F (36.9 C), Max:102.7 F (39.3 C)  Recent Labs  Lab 04/28/20 1540 04/28/20 1545 04/28/20 1800  WBC 21.5*  --   --   CREATININE 0.78  --   --   LATICACIDVEN  --  2.1* 2.3*    CrCl cannot be calculated (Unknown ideal weight.).    No Known Allergies  Antimicrobials this admission: cefepime 7/8 >>  vancomycin 7/8 >>  Metronidazole 7/8 >>  Dose adjustments this admission:  Microbiology results: 7/8 BCx:  7/8 UCx:    Thank you for allowing pharmacy to be a part of this patient's care.  Cindi Carbon, PharmD 04/28/2020 9:39 PM
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
## 2020-04-28 NOTE — ED Notes (Signed)
Per Diplomatic Services operational officer, charge is coming to evaluate pt and determine room assignment.

## 2020-04-28 NOTE — ED Notes (Signed)
Date and time results received: 04/28/20 8:32 PM  (use smartphrase ".now" to insert current time)  Test: PO2 Critical Value: less than 31.0  Name of Provider Notified: Adela Glimpse, MD  Orders Received? Or Actions Taken?:

## 2020-04-28 NOTE — Progress Notes (Signed)
A consult was received from an ED physician for vancomycin and cefepime per pharmacy dosing (for an indication other than meningitis). The patient's profile has been reviewed for ht/wt/allergies/indication/available labs. A one time order has been placed for the above antibiotics.  Further antibiotics/pharmacy consults should be ordered by admitting physician if indicated.                       Bernadene Person, PharmD, BCPS [phone] 04/28/2020, 5:15 PM

## 2020-04-29 ENCOUNTER — Other Ambulatory Visit: Payer: Self-pay

## 2020-04-29 ENCOUNTER — Inpatient Hospital Stay (HOSPITAL_COMMUNITY): Payer: Self-pay

## 2020-04-29 DIAGNOSIS — I361 Nonrheumatic tricuspid (valve) insufficiency: Secondary | ICD-10-CM

## 2020-04-29 DIAGNOSIS — R4182 Altered mental status, unspecified: Secondary | ICD-10-CM

## 2020-04-29 DIAGNOSIS — A4102 Sepsis due to Methicillin resistant Staphylococcus aureus: Principal | ICD-10-CM

## 2020-04-29 DIAGNOSIS — E440 Moderate protein-calorie malnutrition: Secondary | ICD-10-CM | POA: Insufficient documentation

## 2020-04-29 DIAGNOSIS — B1920 Unspecified viral hepatitis C without hepatic coma: Secondary | ICD-10-CM

## 2020-04-29 DIAGNOSIS — R7881 Bacteremia: Secondary | ICD-10-CM

## 2020-04-29 DIAGNOSIS — R509 Fever, unspecified: Secondary | ICD-10-CM

## 2020-04-29 LAB — COMPREHENSIVE METABOLIC PANEL
ALT: 37 U/L (ref 0–44)
AST: 70 U/L — ABNORMAL HIGH (ref 15–41)
Albumin: 1.7 g/dL — ABNORMAL LOW (ref 3.5–5.0)
Alkaline Phosphatase: 107 U/L (ref 38–126)
Anion gap: 11 (ref 5–15)
BUN: 32 mg/dL — ABNORMAL HIGH (ref 6–20)
CO2: 23 mmol/L (ref 22–32)
Calcium: 7.3 mg/dL — ABNORMAL LOW (ref 8.9–10.3)
Chloride: 100 mmol/L (ref 98–111)
Creatinine, Ser: 0.81 mg/dL (ref 0.61–1.24)
GFR calc Af Amer: >60 mL/min (ref >60)
GFR calc non Af Amer: >60 mL/min (ref >60)
Glucose, Bld: 125 mg/dL — ABNORMAL HIGH (ref 70–99)
Potassium: 3.6 mmol/L (ref 3.5–5.1)
Sodium: 134 mmol/L — ABNORMAL LOW (ref 135–145)
Total Bilirubin: 1.6 mg/dL — ABNORMAL HIGH (ref 0.3–1.2)
Total Protein: 5.6 g/dL — ABNORMAL LOW (ref 6.5–8.1)

## 2020-04-29 LAB — BLOOD CULTURE ID PANEL (REFLEXED)

## 2020-04-29 LAB — CBC WITH DIFFERENTIAL/PLATELET
Abs Immature Granulocytes: 0.82 10*3/uL — ABNORMAL HIGH (ref 0.00–0.07)
Basophils Absolute: 0.1 10*3/uL (ref 0.0–0.1)
Basophils Relative: 1 %
Eosinophils Absolute: 0 10*3/uL (ref 0.0–0.5)
Eosinophils Relative: 0 %
HCT: 32.6 % — ABNORMAL LOW (ref 39.0–52.0)
Hemoglobin: 11 g/dL — ABNORMAL LOW (ref 13.0–17.0)
Immature Granulocytes: 4 %
Lymphocytes Relative: 5 %
Lymphs Abs: 1.1 10*3/uL (ref 0.7–4.0)
MCH: 27.8 pg (ref 26.0–34.0)
MCHC: 33.7 g/dL (ref 30.0–36.0)
MCV: 82.5 fL (ref 80.0–100.0)
Monocytes Absolute: 1.3 10*3/uL — ABNORMAL HIGH (ref 0.1–1.0)
Monocytes Relative: 6 %
Neutro Abs: 17.9 10*3/uL — ABNORMAL HIGH (ref 1.7–7.7)
Neutrophils Relative %: 84 %
Platelets: 81 10*3/uL — ABNORMAL LOW (ref 150–400)
RBC: 3.95 MIL/uL — ABNORMAL LOW (ref 4.22–5.81)
RDW: 15.3 % (ref 11.5–15.5)
WBC: 21.2 10*3/uL — ABNORMAL HIGH (ref 4.0–10.5)
nRBC: 0 % (ref 0.0–0.2)

## 2020-04-29 LAB — ECHOCARDIOGRAM COMPLETE
Height: 67 in
Weight: 1940.05 oz

## 2020-04-29 LAB — LACTIC ACID, PLASMA: Lactic Acid, Venous: 1.3 mmol/L (ref 0.5–1.9)

## 2020-04-29 LAB — HEPATITIS PANEL, ACUTE
HCV Ab: REACTIVE — AB
Hep A IgM: NONREACTIVE
Hep B C IgM: NONREACTIVE
Hepatitis B Surface Ag: NONREACTIVE

## 2020-04-29 LAB — OSMOLALITY: Osmolality: 291 mOsm/kg (ref 275–295)

## 2020-04-29 LAB — PREALBUMIN: Prealbumin: <5 mg/dL — ABNORMAL LOW (ref 18–38)

## 2020-04-29 LAB — PHOSPHORUS: Phosphorus: 3.1 mg/dL (ref 2.5–4.6)

## 2020-04-29 LAB — TSH: TSH: 0.441 u[IU]/mL (ref 0.350–4.500)

## 2020-04-29 LAB — MAGNESIUM: Magnesium: 2.6 mg/dL — ABNORMAL HIGH (ref 1.7–2.4)

## 2020-04-29 MED ORDER — MUPIROCIN 2 % EX OINT
1.0000 "application " | TOPICAL_OINTMENT | Freq: Two times a day (BID) | CUTANEOUS | Status: AC
  Administered 2020-04-29 – 2020-05-03 (×10): 1 via NASAL
  Filled 2020-04-29 (×3): qty 22

## 2020-04-29 MED ORDER — VANCOMYCIN HCL 750 MG/150ML IV SOLN
750.0000 mg | Freq: Three times a day (TID) | INTRAVENOUS | Status: DC
  Administered 2020-04-29 – 2020-05-01 (×6): 750 mg via INTRAVENOUS
  Filled 2020-04-29 (×8): qty 150

## 2020-04-29 MED ORDER — BOOST / RESOURCE BREEZE PO LIQD CUSTOM
1.0000 | Freq: Three times a day (TID) | ORAL | Status: DC
  Administered 2020-04-29 – 2020-05-02 (×3): 1 via ORAL

## 2020-04-29 NOTE — Assessment & Plan Note (Signed)
-   considered due to IV drugs as well as sepsis/infection - mentation has cleared and diet started

## 2020-04-29 NOTE — Assessment & Plan Note (Signed)
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
## 2020-04-29 NOTE — Progress Notes (Signed)
Initial Nutrition Assessment  DOCUMENTATION CODES:   Non-severe (moderate) malnutrition in context of social or environmental circumstances  INTERVENTION:  Boost Breeze po TID, each supplement provides 250 kcal and 9 grams of protein  Monitor for diet advancement and order oral nutrition supplements as appropriate.   NUTRITION DIAGNOSIS:   Moderate Malnutrition related to social / environmental circumstances as evidenced by energy intake < 75% for > or equal to 3 months, moderate fat depletion, moderate muscle depletion, percent weight loss.   GOAL:   Patient will meet greater than or equal to 90% of their needs    MONITOR:   PO intake, Supplement acceptance, Diet advancement, I & O's  REASON FOR ASSESSMENT:   Consult Assessment of nutrition requirement/status  ASSESSMENT:   Pt admitted for multiple septic emboli suspicious for possible endocarditis in the setting of IV drug use resulting in sepsis. PMH significant for polysubstance abuse.   Pt reports eating only 1 meal per day PTA, but is unable to provide any details at this time.   Per wt readings, pt with a 24% wt loss x4 months, which is significant for time frame.   UOP: x24 hours I/O: +827.73ml since admit  Labs: Na 134 (L), Mg 2.6 (L) Medications reviewed.   NUTRITION - FOCUSED PHYSICAL EXAM:    Most Recent Value  Orbital Region Moderate depletion  Upper Arm Region Moderate depletion  Thoracic and Lumbar Region Mild depletion  Buccal Region Moderate depletion  Temple Region Moderate depletion  Clavicle Bone Region Mild depletion  Clavicle and Acromion Bone Region Mild depletion  Scapular Bone Region Mild depletion  Dorsal Hand Mild depletion  Patellar Region Moderate depletion  Anterior Thigh Region Moderate depletion  Posterior Calf Region Moderate depletion  Edema (RD Assessment) None  Hair Reviewed  Eyes Reviewed  Mouth Reviewed  Skin Reviewed  Nails Reviewed       Diet Order:    Diet Order            Diet clear liquid Room service appropriate? Yes; Fluid consistency: Thin  Diet effective now                 EDUCATION NEEDS:   Not appropriate for education at this time  Skin:  Skin Assessment: Reviewed RN Assessment  Last BM:  7/8  Height:   Ht Readings from Last 1 Encounters:  04/28/20 5\' 7"  (1.702 m)    Weight:   Wt Readings from Last 1 Encounters:  04/28/20 55 kg    BMI:  Body mass index is 18.99 kg/m.  Estimated Nutritional Needs:   Kcal:  1800-2000  Protein:  85-95 grams  Fluid:  >1.8L/d    06/29/20, MS, RD, LDN RD pager number and weekend/on-call pager number located in Amion.

## 2020-04-29 NOTE — Progress Notes (Signed)
PHARMACY - PHYSICIAN COMMUNICATION CRITICAL VALUE ALERT - BLOOD CULTURE IDENTIFICATION (BCID)  Clifford Clark is an 29 y.o. male who presented to [facility] on 04/28/2020 with a chief complaint of polysubstance abuse.  Assessment: CT imaging showed innumerable pulmonary nodules bilaterally mainly which cavitary concerning for multiple septic emboli with trace bilateral pleural effusions cardiomegaly splenomegaly renal involvement of septic emboli versus bilateral pyelonephritis  Name of physician (or Provider) Contacted: Dr. Frederick Peers  Current antibiotics: cefepime, vancomycin and metronidazole   Changes to prescribed antibiotics recommended:  - Stop cefepime, metrondiazole, continue vancomycin  - Pt weight has also been updated to 55 kg. Therefore will empirically adjust vancomycin dose to 750 mg IV q8h as previous listed weight available was 68 kg   Results for orders placed or performed during the hospital encounter of 04/28/20  Blood Culture ID Panel (Reflexed) (Collected: 04/28/2020  3:45 PM)  Result Value Ref Range   Enterococcus species NOT DETECTED NOT DETECTED   Listeria monocytogenes NOT DETECTED NOT DETECTED   Staphylococcus species DETECTED (A) NOT DETECTED   Staphylococcus aureus (BCID) DETECTED (A) NOT DETECTED   Methicillin resistance DETECTED (A) NOT DETECTED   Streptococcus species NOT DETECTED NOT DETECTED   Streptococcus agalactiae NOT DETECTED NOT DETECTED   Streptococcus pneumoniae NOT DETECTED NOT DETECTED   Streptococcus pyogenes NOT DETECTED NOT DETECTED   Acinetobacter baumannii NOT DETECTED NOT DETECTED   Enterobacteriaceae species NOT DETECTED NOT DETECTED   Enterobacter cloacae complex NOT DETECTED NOT DETECTED   Escherichia coli NOT DETECTED NOT DETECTED   Klebsiella oxytoca NOT DETECTED NOT DETECTED   Klebsiella pneumoniae NOT DETECTED NOT DETECTED   Proteus species NOT DETECTED NOT DETECTED   Serratia marcescens NOT DETECTED NOT DETECTED   Haemophilus  influenzae NOT DETECTED NOT DETECTED   Neisseria meningitidis NOT DETECTED NOT DETECTED   Pseudomonas aeruginosa NOT DETECTED NOT DETECTED   Candida albicans NOT DETECTED NOT DETECTED   Candida glabrata NOT DETECTED NOT DETECTED   Candida krusei NOT DETECTED NOT DETECTED   Candida parapsilosis NOT DETECTED NOT DETECTED   Candida tropicalis NOT DETECTED NOT DETECTED    Adalberto Cole, PharmD, BCPS 04/29/2020 8:32 AM

## 2020-04-29 NOTE — Assessment & Plan Note (Signed)
-   see MRSA bacteremia

## 2020-04-29 NOTE — Assessment & Plan Note (Signed)
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

## 2020-04-29 NOTE — Assessment & Plan Note (Addendum)
-   TTE performed on 04/29/20: TV vegetation (52mm) noted as well as possible veg on PV - Mild to moderate AV sclerosis/calcification is present, without any evidence of aortic stenosis. Mild TR  - will obtain TEE to further evaluate; await further input on need for this per ID as well - ID following, appreciate assistance  - will need minimum 6 weeks IV vanc - trend BC until clear (7/10 cultures remain NGTD) prior to PICC placement; patient will need to remain in hospital for duration of treatment course; no indication for IVC if tries to leave AMA - other complaint to keep note of is ongoing back pain (diffuse, even paraspinal) so suspicion for discitis/OM is lower currently as pain is diffuse, however if does not improve may need to be further considered, especially if blood cultures are difficult to clear

## 2020-04-29 NOTE — Assessment & Plan Note (Addendum)
-   likely due to acute illness/sepsis; cannot rule out other etiologies yet - trend CBC with infection treatment; slowly improving
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
## 2020-04-29 NOTE — Progress Notes (Signed)
  Echocardiogram 2D Echocardiogram has been performed.  Clifford Clark 04/29/2020, 9:55 AM

## 2020-04-29 NOTE — Assessment & Plan Note (Signed)
-   replete and recheck as needed 

## 2020-04-29 NOTE — Assessment & Plan Note (Addendum)
-   possibly due to current infection/sepsis vs risk for hepatitis from IV drug use - hepatitis panel noted for hep C Ab positive - check HCV Quant VL: not elevated - LFTs improving with infection treatment

## 2020-04-30 LAB — COMPREHENSIVE METABOLIC PANEL
ALT: 30 U/L (ref 0–44)
AST: 32 U/L (ref 15–41)
Albumin: 1.5 g/dL — ABNORMAL LOW (ref 3.5–5.0)
Alkaline Phosphatase: 91 U/L (ref 38–126)
Anion gap: 7 (ref 5–15)
BUN: 41 mg/dL — ABNORMAL HIGH (ref 6–20)
CO2: 22 mmol/L (ref 22–32)
Calcium: 6.9 mg/dL — ABNORMAL LOW (ref 8.9–10.3)
Chloride: 98 mmol/L (ref 98–111)
Creatinine, Ser: 1.01 mg/dL (ref 0.61–1.24)
GFR calc Af Amer: >60 mL/min (ref >60)
GFR calc non Af Amer: >60 mL/min (ref >60)
Glucose, Bld: 98 mg/dL (ref 70–99)
Potassium: 3.8 mmol/L (ref 3.5–5.1)
Sodium: 127 mmol/L — ABNORMAL LOW (ref 135–145)
Total Bilirubin: 1.1 mg/dL (ref 0.3–1.2)
Total Protein: 5.4 g/dL — ABNORMAL LOW (ref 6.5–8.1)

## 2020-04-30 LAB — CBC WITH DIFFERENTIAL/PLATELET
Abs Immature Granulocytes: 0.61 10*3/uL — ABNORMAL HIGH (ref 0.00–0.07)
Basophils Absolute: 0.1 10*3/uL (ref 0.0–0.1)
Basophils Relative: 0 %
Eosinophils Absolute: 0.1 10*3/uL (ref 0.0–0.5)
Eosinophils Relative: 0 %
HCT: 32.3 % — ABNORMAL LOW (ref 39.0–52.0)
Hemoglobin: 10.7 g/dL — ABNORMAL LOW (ref 13.0–17.0)
Immature Granulocytes: 3 %
Lymphocytes Relative: 9 %
Lymphs Abs: 1.6 10*3/uL (ref 0.7–4.0)
MCH: 27.4 pg (ref 26.0–34.0)
MCHC: 33.1 g/dL (ref 30.0–36.0)
MCV: 82.6 fL (ref 80.0–100.0)
Monocytes Absolute: 1.3 10*3/uL — ABNORMAL HIGH (ref 0.1–1.0)
Monocytes Relative: 7 %
Neutro Abs: 14.6 10*3/uL — ABNORMAL HIGH (ref 1.7–7.7)
Neutrophils Relative %: 81 %
Platelets: 92 10*3/uL — ABNORMAL LOW (ref 150–400)
RBC: 3.91 MIL/uL — ABNORMAL LOW (ref 4.22–5.81)
RDW: 15.4 % (ref 11.5–15.5)
WBC: 18.1 10*3/uL — ABNORMAL HIGH (ref 4.0–10.5)
nRBC: 0 % (ref 0.0–0.2)

## 2020-04-30 LAB — HCV RNA QUANT
HCV Quantitative Log: 1.477 log10 IU/mL — ABNORMAL LOW (ref >1.70)
HCV Quantitative: 30 IU/mL — ABNORMAL LOW (ref >50)

## 2020-04-30 LAB — MAGNESIUM: Magnesium: 2.7 mg/dL — ABNORMAL HIGH (ref 1.7–2.4)

## 2020-04-30 LAB — CALCIUM, IONIZED: Calcium, Ionized, Serum: 4.6 mg/dL (ref 4.5–5.6)

## 2020-04-30 MED ORDER — OXYCODONE HCL 5 MG PO TABS
5.0000 mg | ORAL_TABLET | Freq: Four times a day (QID) | ORAL | Status: DC | PRN
  Administered 2020-05-01 – 2020-05-03 (×3): 5 mg via ORAL
  Filled 2020-04-30 (×3): qty 1

## 2020-04-30 MED ORDER — LORAZEPAM 2 MG/ML IJ SOLN
0.5000 mg | INTRAMUSCULAR | Status: DC | PRN
  Administered 2020-04-30 – 2020-05-08 (×34): 0.5 mg via INTRAVENOUS
  Filled 2020-04-30 (×34): qty 1

## 2020-04-30 NOTE — Assessment & Plan Note (Signed)
Per RD: "Moderate Malnutrition related to social / environmental circumstances as evidenced by energy intake < 75% for > or equal to 3 months, moderate fat depletion, moderate muscle depletion, percent weight loss." - appreciate assistance - diet advanced on 7/10

## 2020-04-30 NOTE — Assessment & Plan Note (Signed)
-  Replete and recheck as needed 

## 2020-04-30 NOTE — Progress Notes (Signed)
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 concern for a ganglion cyst. IMPRESSION: 1. No acute fracture or dislocation. 2. Soft tissue swelling over the ulnar styloid. Electronically Signed   By: Anner Crete M.D.   On: 04/28/2020 17:16   ECHOCARDIOGRAM COMPLETE  Result Date: 04/29/2020    ECHOCARDIOGRAM REPORT   Patient Name:   TYELER GOEDKEN Date of Exam: 04/29/2020 Medical Rec #:  [id number]     Height:       67.0 in Accession #:    [id number]  Weight:       121.3 lb Date of Birth:  Aug 22, 1991     BSA:          1.635 m Patient Age:    28 years      BP:           115/59 mmHg Patient Gender: M             HR:           104 bpm. Exam Location:  Inpatient Procedure: 2D  Echo Indications:    fever  History:        Patient has no prior history of Echocardiogram examinations.                 Risk Factors:Former Smoker. Polysubstance abuse.  Sonographer:    Jannett Celestine RDCS (AE) Referring Phys: [place]  Sonographer Comments: see comments. restricted mobility IMPRESSIONS  1. Left ventricular ejection fraction, by estimation, is 50 to 55%. The left ventricle has low normal function. The left ventricle has no regional wall motion abnormalities. Left ventricular diastolic parameters were normal.  2. Right ventricular systolic function is low normal. The right ventricular size is normal.  3. The mitral valve is abnormal. Trivial mitral valve regurgitation.  4. Mobile echobright lesion along atrial surface of tricuspid valve (14 mm long) consistent with vegetation. REcommend TEE to further define . The tricuspid valve is abnormal.  5. The aortic valve is abnormal. Aortic valve regurgitation is not visualized. Mild to moderate aortic valve sclerosis/calcification is present, without any evidence of aortic stenosis.  6. Small echobright lesion appears associated with pulmonic valve, cannot exlude vegetation.  7. The inferior vena cava is normal in size with greater than 50% respiratory variability, suggesting right atrial pressure of 3 mmHg. FINDINGS  Left Ventricle: Left ventricular ejection fraction, by estimation, is 50 to 55%. The left ventricle has low normal function. The left ventricle has no regional wall motion abnormalities. The left ventricular internal cavity size was normal in size. There is no left ventricular hypertrophy. Left ventricular diastolic parameters were normal. Right Ventricle: The right ventricular size is normal. Right vetricular wall thickness was not assessed. Right ventricular systolic function is low normal. Left Atrium: Left atrial size was normal in size. Right Atrium: Right atrial size was normal in size. Pericardium: A small pericardial  effusion is present. The pericardial effusion is circumferential. Mitral Valve: The mitral valve is abnormal. There is mild thickening of the mitral valve leaflet(s). Trivial mitral valve regurgitation. Tricuspid Valve: Mobile echobright lesion along atrial surface of tricuspid valve (14 mm long) consistent with vegetation. REcommend TEE to further define. The tricuspid valve is abnormal. Tricuspid valve regurgitation is mild. Aortic Valve: The aortic valve is abnormal. Aortic valve regurgitation is not visualized. Mild to moderate aortic valve sclerosis/calcification is present, without any evidence of aortic stenosis. Pulmonic Valve: Small echobright lesion, cannot exlude vegetation. The pulmonic valve was abnormal. Pulmonic valve regurgitation is trivial. Aorta: The aortic root is normal in size and structure. Venous: The inferior vena cava is normal in size with greater than 50% respiratory variability, suggesting right atrial pressure of 3 mmHg. IAS/Shunts: No atrial level shunt detected by color flow Doppler.  LEFT VENTRICLE PLAX 2D LVIDd:         4.70 cm  Diastology LVIDs:         3.30 cm  LV e' lateral:   11.90 cm/s LV PW:         1.00 cm  LV E/e' lateral: 4.8 LV IVS:        0.80 cm  LV e' medial:    9.14 cm/s LVOT diam:     1.90 cm  LV E/e' medial:  6.3 LV SV:         31 LV SV Index:   19 LVOT Area:     2.84 cm  LEFT ATRIUM         Index LA diam:    3.10 cm 1.90 cm/m  AORTIC VALVE LVOT Vmax:   72.50 cm/s LVOT Vmean:  45.800 cm/s LVOT VTI:    0.109 m  AORTA Ao Root diam: 3.00 cm MITRAL VALVE MV Area (PHT): 2.50 cm    SHUNTS MV Decel Time: 304 msec    Systemic VTI:  0.11 m MV E velocity: 57.40 cm/s  Systemic Diam: 1.90 cm MV A velocity: 60.00 cm/s MV E/A ratio:  0.96 Dorris Carnes MD Electronically signed by Dorris Carnes MD Signature Date/Time: 04/29/2020/12:49:56 PM    Final    US Abdomen Limited RUQ  Result Date: 04/28/2020 CLINICAL DATA:  Elevated LFTs EXAM: ULTRASOUND ABDOMEN LIMITED RIGHT UPPER QUADRANT  COMPARISON:  CT from same day. FINDINGS: Gallbladder: No gallstones or wall thickening visualized. No sonographic Murphy sign noted by sonographer. Common bile duct: Diameter: 4 mm Liver: The liver is enlarged. Portal vein is patent on color Doppler imaging with normal direction of blood flow towards the liver. Other: None. IMPRESSION: 1. No acute abnormality. 2. Hepatomegaly. Electronically Signed   By: Constance Holster M.D.   On: 04/28/2020 20:41   US Abdomen Limited RUQ  Final Result    CT Abdomen Pelvis W Contrast  Final Result    CT Head Wo Contrast  Final Result    CT Cervical Spine Wo Contrast  Final Result    CT Chest W Contrast  Final Result    DG Chest Port 1 View  Final Result    DG Wrist Complete Right  Final Result    DG Hand Complete Right  Final Result    DG Hand Complete Left  Final Result       Scheduled Meds: . Chlorhexidine Gluconate Cloth  6 each Topical Q0600  . docusate sodium  100 mg Oral BID  . feeding supplement  1 Container Oral TID BM  . mouth rinse  15 mL Mouth Rinse BID  . mupirocin ointment  1 application Nasal BID  . sodium chloride flush  3 mL Intravenous Q12H   PRN Meds: acetaminophen **OR** acetaminophen, ondansetron **OR** ondansetron (ZOFRAN) IV Continuous Infusions: . vancomycin 750 mg (04/30/20 1020)      LOS: 2 days  Time spent: Greater than 50% of the 35 minute visit was spent in counseling/coordination of care for the patient as laid out in the A&P.   Dwyane Dee, MD [facility] 04/30/2020, 11:39 AM   Contact via secure chat.  To contact the attending provider between 7A-7P or the covering provider during after hours 7P-7A, please log into the web site [email or web address] and access using universal [place] password for that web site. If you do not have the password, please call the hospital operator.

## 2020-05-01 ENCOUNTER — Inpatient Hospital Stay (HOSPITAL_COMMUNITY): Payer: Self-pay

## 2020-05-01 DIAGNOSIS — R4689 Other symptoms and signs involving appearance and behavior: Secondary | ICD-10-CM

## 2020-05-01 LAB — COMPREHENSIVE METABOLIC PANEL
ALT: 29 U/L (ref 0–44)
AST: 37 U/L (ref 15–41)
Albumin: 1.6 g/dL — ABNORMAL LOW (ref 3.5–5.0)
Alkaline Phosphatase: 104 U/L (ref 38–126)
Anion gap: 8 (ref 5–15)
BUN: 70 mg/dL — ABNORMAL HIGH (ref 6–20)
CO2: 23 mmol/L (ref 22–32)
Calcium: 7.3 mg/dL — ABNORMAL LOW (ref 8.9–10.3)
Chloride: 97 mmol/L — ABNORMAL LOW (ref 98–111)
Creatinine, Ser: 1.38 mg/dL — ABNORMAL HIGH (ref 0.61–1.24)
GFR calc Af Amer: >60 mL/min (ref >60)
GFR calc non Af Amer: >60 mL/min (ref >60)
Glucose, Bld: 92 mg/dL (ref 70–99)
Potassium: 4.2 mmol/L (ref 3.5–5.1)
Sodium: 128 mmol/L — ABNORMAL LOW (ref 135–145)
Total Bilirubin: 1 mg/dL (ref 0.3–1.2)
Total Protein: 5.7 g/dL — ABNORMAL LOW (ref 6.5–8.1)

## 2020-05-01 LAB — CBC WITH DIFFERENTIAL/PLATELET
Abs Immature Granulocytes: 0.76 10*3/uL — ABNORMAL HIGH (ref 0.00–0.07)
Basophils Absolute: 0.1 10*3/uL (ref 0.0–0.1)
Basophils Relative: 0 %
Eosinophils Absolute: 0.1 10*3/uL (ref 0.0–0.5)
Eosinophils Relative: 0 %
HCT: 29.2 % — ABNORMAL LOW (ref 39.0–52.0)
Hemoglobin: 9.9 g/dL — ABNORMAL LOW (ref 13.0–17.0)
Immature Granulocytes: 4 %
Lymphocytes Relative: 9 %
Lymphs Abs: 1.8 10*3/uL (ref 0.7–4.0)
MCH: 27.6 pg (ref 26.0–34.0)
MCHC: 33.9 g/dL (ref 30.0–36.0)
MCV: 81.3 fL (ref 80.0–100.0)
Monocytes Absolute: 1.4 10*3/uL — ABNORMAL HIGH (ref 0.1–1.0)
Monocytes Relative: 7 %
Neutro Abs: 16.1 10*3/uL — ABNORMAL HIGH (ref 1.7–7.7)
Neutrophils Relative %: 80 %
Platelets: 112 10*3/uL — ABNORMAL LOW (ref 150–400)
RBC: 3.59 MIL/uL — ABNORMAL LOW (ref 4.22–5.81)
RDW: 15.1 % (ref 11.5–15.5)
WBC: 20.2 10*3/uL — ABNORMAL HIGH (ref 4.0–10.5)
nRBC: 0 % (ref 0.0–0.2)

## 2020-05-01 LAB — URINE CULTURE: Culture: >=100000 — AB

## 2020-05-01 LAB — MAGNESIUM: Magnesium: 2.4 mg/dL (ref 1.7–2.4)

## 2020-05-01 LAB — VANCOMYCIN, TROUGH: Vancomycin Tr: 31 ug/mL (ref 15–20)

## 2020-05-01 MED ORDER — SODIUM CHLORIDE (PF) 0.9 % IJ SOLN
INTRAMUSCULAR | Status: AC
  Filled 2020-05-01: qty 50

## 2020-05-01 MED ORDER — ENOXAPARIN SODIUM 40 MG/0.4ML ~~LOC~~ SOLN
40.0000 mg | SUBCUTANEOUS | Status: DC
  Administered 2020-05-02 – 2020-05-09 (×8): 40 mg via SUBCUTANEOUS
  Filled 2020-05-01 (×8): qty 0.4

## 2020-05-01 MED ORDER — VANCOMYCIN VARIABLE DOSE PER UNSTABLE RENAL FUNCTION (PHARMACIST DOSING): Status: DC

## 2020-05-01 MED ORDER — IOHEXOL 300 MG/ML  SOLN
100.0000 mL | Freq: Once | INTRAMUSCULAR | Status: AC | PRN
  Administered 2020-05-01: 100 mL via INTRAVENOUS

## 2020-05-01 NOTE — Progress Notes (Signed)
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  EMS was called and he was transported to the hospital.  He was found to be extremely disheveled/unkempt, mildly hypoxic, 86% on room air. He underwent work-up in the ER with multiple imaging studies including x-ray of his right hand and wrist, x-ray of left hand, CXR, CT chest/abdomen/pelvis, CT C-spine, and CT head.  There were no acute fractures in his bilateral upper extremities.  CT head also unremarkable.  Remainder of his imaging studies revealed what appeared to be septic pulmonary emboli as well as septic emboli involving both kidneys.   Cultures were drawn and he was started on vancomycin, cefepime, and Flagyl in the ER.  The morning following admission, 4/4 blood cultures were growing MRSA. TTE also performed on 7/9 revealed TV vegetation.   On 05/01/20 he also developed worsening swelling in his right wrist and left hand. There was concern for septic joint or bursitis. A CT was ordered bilaterally and his case was discussed with hand surgery to further evaluate.  He also became irate and angry later in the morning of 7/11 and started voice threats to the staff, notably about his nurse and the attending physician, stating "I'm going to kill him slowly".  Psych was consulted and a sitter was requested.    Interval History:  No events overnight.  He was calm and cooperative during my interview with him this morning as well as during physical exam.  Later in the morning after rounds, he voiced several aggressive and homicidal remarks to his nurse, notably threats of wanting to harm and kill his attending physician. Psychiatry  was consulted and a sitter request was placed.  Old records reviewed in assessment of this patient  ROS: Constitutional: positive for fatigue and malaise, negative for fevers and night sweats, Respiratory: positive for pleurisy/chest pain, Cardiovascular: negative for chest pain and Gastrointestinal: negative for abdominal pain  Assessment & Plan: IVDU (intravenous drug user) - patient endorses 4-5 times daily use of Fentanyl - he will need a lot of encouragement and addiction counseling prior to discharge - he is high risk for leaving AMA and for worsening infection and/or death if he does; called and spoke with his mom [name] on 04/29/20 and updated her as well as told her all this as well  Endocarditis - TTE performed on 04/29/20: TV vegetation (32mm) noted as well as possible veg on PV - Mild to moderate AV sclerosis/calcification is present, without any evidence of aortic stenosis. Mild TR  - will obtain TEE to further evaluate; await further input on need for this per ID as well - ID following, appreciate assistance  - will need minimum 6 weeks IV vanc - trend BC until clear prior to PICC placement; patient will need to remain in hospital for duration of treatment course; no indication for IVC if tries to leave AMA  Sepsis (HCC) - see MRSA bacteremia  Acute metabolic encephalopathy - considered due to IV drugs as well as sepsis/infection - mentation has cleared and diet started   Thrombocytopenia (HCC) - likely due to acute illness/sepsis; cannot rule out other etiologies  yet - trend CBC with infection treatment; slowly improving   Hyponatremia - replete and recheck as needed  Elevated LFTs - possibly due to current infection/sepsis vs risk for hepatitis from IV drug use - hepatitis panel noted for hep C Ab positive - check HCV Quant VL: not elevated - LFTs improving with infection treatment  MRSA bacteremia - see IVDA and endocarditis - continue Vanc    Hypokalemia -Replete and recheck as needed  Malnutrition of moderate degree Per RD: "Moderate Malnutrition related to social / environmental circumstances as evidenced by energy intake < 75% for > or equal to 3 months, moderate fat depletion, moderate muscle depletion, percent weight loss." - appreciate assistance - diet advanced on 7/10  Threatening behavior - on 7/11, patient endorsed multiple homicidal ideations to nurse and CNA in room; he threatened that he was going "to kill the attending" and all their family and kids; this was voiced several times despite nursing attempting to re-direct patient - will request psych eval at this time; also have ordered sitter - he already has oxycodone for pain and opioid w/d as well as ativan for anxiety/agitation   Antimicrobials: Vanc 04/28/20 >> present Cefepime 04/28/20 - 04/29/20 Flagyl 04/28/20 - 04/29/20  DVT prophylaxis: Lovenox Code Status: Full Family Communication: Mother Disposition Plan:  Status is: Inpatient  Remains inpatient appropriate because:Ongoing active pain requiring inpatient pain management, Unsafe d/c plan, IV treatments appropriate due to intensity of illness or inability to take PO and Inpatient level of care appropriate due to severity of illness   Dispo: The patient is from: Home              Anticipated d/c is to: Home              Anticipated d/c date is: > 3 days              Patient currently is not medically stable to d/c.       Objective: Blood pressure (!) 142/59, pulse (!) 119, temperature 98.3 F (36.8 C), temperature source Oral, resp. rate (!) 25, height  (1.702 m), weight 55 kg, SpO2 100 %.   Intake/Output Summary (Last 24 hours) at 05/01/2020 1342 Last data filed at 05/01/2020 0527 Gross per 24 hour  Intake 925.57 ml  Output 1550 ml  Net -624.43 ml   Last Weight  Most recent update: 04/28/2020  9:43 PM   Weight  55 kg (121 lb 4.1 oz)           Examination: General appearance:  Young  man resting in bed comfortable and in no obvious distress Head: Normocephalic, without obvious abnormality Eyes: EOMI Lungs: clear to auscultation bilaterally Heart: regular rate and rhythm and S1, S2 normal Abdomen: normal findings: bowel sounds normal and soft, non-tender Extremities: covered in dirt; do not apprecaite any splinter hemorrhages or Osler notes/Janeway; right wrist now notable for palpable fluctuance over the dorsal ulnar aspect and is mildly tender to palpation.  Left dorsal hand also noted with small area of swelling, also tender to palpation.  No lower extremity edema Skin: covered in dirt Neurologic: Grossly normal   Consultants:   ID  Hand surgery  Procedures:   TTE 04/29/2020  Data Reviewed: I have personally reviewed following labs and imaging studies Results for orders placed or performed during the hospital encounter of 04/28/20 (from the past 24 hour(s))  CBC with Differential/Platelet     Status: Abnormal   Collection Time: 05/01/20  7:33 AM  Result  Value Ref Range   WBC 20.2 (H) 4.0 - 10.5 K/uL   RBC 3.59 (L) 4.22 - 5.81 MIL/uL   Hemoglobin 9.9 (L) 13.0 - 17.0 g/dL   HCT 08.6 (L) 39 - 52 %   MCV 81.3 80.0 - 100.0 fL   MCH 27.6 26.0 - 34.0 pg   MCHC 33.9 30.0 - 36.0 g/dL   RDW 57.8 46.9 - 62.9 %   Platelets 112 (L) 150 - 400 K/uL   nRBC 0.0 0.0 - 0.2 %   Neutrophils Relative % 80 %   Neutro Abs 16.1 (H) 1.7 - 7.7 K/uL   Lymphocytes Relative 9 %   Lymphs Abs 1.8 0.7 - 4.0 K/uL   Monocytes Relative 7 %   Monocytes Absolute 1.4 (H) 0 - 1 K/uL   Eosinophils Relative 0 %   Eosinophils Absolute 0.1 0 - 0 K/uL   Basophils Relative 0 %   Basophils Absolute 0.1 0 - 0 K/uL   Immature Granulocytes 4 %   Abs Immature Granulocytes 0.76 (H) 0.00 - 0.07 K/uL  Comprehensive metabolic panel     Status: Abnormal   Collection Time: 05/01/20  7:33 AM  Result Value Ref Range   Sodium 128 (L) 135 - 145 mmol/L   Potassium 4.2 3.5 - 5.1 mmol/L   Chloride 97 (L) 98  - 111 mmol/L   CO2 23 22 - 32 mmol/L   Glucose, Bld 92 70 - 99 mg/dL   BUN 70 (H) 6 - 20 mg/dL   Creatinine, Ser 5.28 (H) 0.61 - 1.24 mg/dL   Calcium 7.3 (L) 8.9 - 10.3 mg/dL   Total Protein 5.7 (L) 6.5 - 8.1 g/dL   Albumin 1.6 (L) 3.5 - 5.0 g/dL   AST 37 15 - 41 U/L   ALT 29 0 - 44 U/L   Alkaline Phosphatase 104 38 - 126 U/L   Total Bilirubin 1.0 0.3 - 1.2 mg/dL   GFR calc non Af Amer >60 >60 mL/min   GFR calc Af Amer >60 >60 mL/min   Anion gap 8 5 - 15  Magnesium     Status: None   Collection Time: 05/01/20  7:33 AM  Result Value Ref Range   Magnesium 2.4 1.7 - 2.4 mg/dL  Vancomycin, trough     Status: Abnormal   Collection Time: 05/01/20  9:55 AM  Result Value Ref Range   Vancomycin Tr 31 (HH) 15 - 20 ug/mL    Recent Results (from the past 240 hour(s))  Culture, blood (routine x 2)     Status: Abnormal (Preliminary result)   Collection Time: 04/28/20  3:45 PM   Specimen: BLOOD  Result Value Ref Range Status   Specimen Description   Final    BLOOD LEFT ANTECUBITAL Performed at [facility], [address] [place], [place] [address]    Special Requests   Final    BOTTLES DRAWN AEROBIC AND ANAEROBIC Blood Culture adequate volume Performed at [facility], [address], [place], [place] [address]    Culture  Setup Time   Final    GRAM POSITIVE COCCI IN BOTH AEROBIC AND ANAEROBIC BOTTLES CRITICAL RESULT CALLED TO, READ BACK BY AND VERIFIED WITH: PHARMD Lynder Parents [phone] FCP    Culture (A)  Final    STAPHYLOCOCCUS AUREUS SUSCEPTIBILITIES TO FOLLOW Performed at [facility], [address], [place], [place] [address]    Report Status PENDING  Incomplete  Blood Culture ID Panel (Reflexed)  Status: Abnormal   Collection Time: 04/28/20  3:45 PM  Result Value Ref Range Status   Enterococcus species NOT DETECTED NOT DETECTED Final   Listeria monocytogenes NOT DETECTED NOT DETECTED Final   Staphylococcus species DETECTED  (A) NOT DETECTED Final    Comment: CRITICAL RESULT CALLED TO, READ BACK BY AND VERIFIED WITH: PHARMD Lynder Parents [phone] FCP    Staphylococcus aureus (BCID) DETECTED (A) NOT DETECTED Final    Comment: Methicillin (oxacillin)-resistant Staphylococcus aureus (MRSA). MRSA is predictably resistant to beta-lactam antibiotics (except ceftaroline). Preferred therapy is vancomycin unless clinically contraindicated. Patient requires contact precautions if  hospitalized. CRITICAL RESULT CALLED TO, READ BACK BY AND VERIFIED WITH: PHARMD M. Derrick Ravel [phone] FCP    Methicillin resistance DETECTED (A) NOT DETECTED Final    Comment: CRITICAL RESULT CALLED TO, READ BACK BY AND VERIFIED WITH: PHARMD M. RENZ [phone] FCP    Streptococcus species NOT DETECTED NOT DETECTED Final   Streptococcus agalactiae NOT DETECTED NOT DETECTED Final   Streptococcus pneumoniae NOT DETECTED NOT DETECTED Final   Streptococcus pyogenes NOT DETECTED NOT DETECTED Final   Acinetobacter baumannii NOT DETECTED NOT DETECTED Final   Enterobacteriaceae species NOT DETECTED NOT DETECTED Final   Enterobacter cloacae complex NOT DETECTED NOT DETECTED Final   Escherichia coli NOT DETECTED NOT DETECTED Final   Klebsiella oxytoca NOT DETECTED NOT DETECTED Final   Klebsiella pneumoniae NOT DETECTED NOT DETECTED Final   Proteus species NOT DETECTED NOT DETECTED Final   Serratia marcescens NOT DETECTED NOT DETECTED Final   Haemophilus influenzae NOT DETECTED NOT DETECTED Final   Neisseria meningitidis NOT DETECTED NOT DETECTED Final   Pseudomonas aeruginosa NOT DETECTED NOT DETECTED Final   Candida albicans NOT DETECTED NOT DETECTED Final   Candida glabrata NOT DETECTED NOT DETECTED Final   Candida krusei NOT DETECTED NOT DETECTED Final   Candida parapsilosis NOT DETECTED NOT DETECTED Final   Candida tropicalis NOT DETECTED NOT DETECTED Final    Comment: Performed at [facility], [address] [place], [place] [address]   Culture, blood (routine x 2)     Status: Abnormal (Preliminary result)   Collection Time: 04/28/20  4:13 PM   Specimen: BLOOD  Result Value Ref Range Status   Specimen Description   Final    BLOOD RIGHT ANTECUBITAL Performed at [facility], [address], [place], [place] [address]    Special Requests   Final    BOTTLES DRAWN AEROBIC AND ANAEROBIC Blood Culture adequate volume Performed at [facility], [address], [place], [place] [address]    Culture  Setup Time   Final    GRAM POSITIVE COCCI IN BOTH AEROBIC AND ANAEROBIC BOTTLES CRITICAL VALUE NOTED.  VALUE IS CONSISTENT WITH PREVIOUSLY REPORTED AND CALLED VALUE.    Culture (A)  Final    STAPHYLOCOCCUS AUREUS SUSCEPTIBILITIES TO FOLLOW Performed at [facility], [address], [place], [place] [address]    Report Status PENDING  Incomplete  Urine culture     Status: Abnormal   Collection Time: 04/28/20  7:00 PM   Specimen: In/Out Cath Urine  Result Value Ref Range Status   Specimen Description   Final    IN/OUT CATH URINE Performed at [facility], [address], [place], [place] [address]    Special Requests   Final    NONE Performed at [facility], [address], [place], [place] [address]    Culture >=100,000  COLONIES/mL STAPHYLOCOCCUS AUREUS (A)  Final   Report Status 05/01/2020 FINAL  Final   Organism ID, Bacteria STAPHYLOCOCCUS AUREUS (A)  Final      Susceptibility   Staphylococcus aureus - MIC*    CIPROFLOXACIN <=0.5 SENSITIVE Sensitive     GENTAMICIN <=0.5 SENSITIVE Sensitive     NITROFURANTOIN <=16 SENSITIVE Sensitive     OXACILLIN 0.5 SENSITIVE Sensitive     TETRACYCLINE <=1 SENSITIVE Sensitive     VANCOMYCIN <=0.5 SENSITIVE Sensitive     TRIMETH/SULFA <=10 SENSITIVE Sensitive     CLINDAMYCIN <=0.25 SENSITIVE Sensitive     RIFAMPIN <=0.5 SENSITIVE Sensitive     Inducible Clindamycin NEGATIVE Sensitive     *  >=100,000 COLONIES/mL STAPHYLOCOCCUS AUREUS  SARS Coronavirus 2 by RT PCR (hospital order, performed in [facility] hospital lab) Nasopharyngeal Nasopharyngeal Swab     Status: None   Collection Time: 04/28/20  7:03 PM   Specimen: Nasopharyngeal Swab  Result Value Ref Range Status   SARS Coronavirus 2 NEGATIVE NEGATIVE Final    Comment: (NOTE) SARS-CoV-2 target nucleic acids are NOT DETECTED.  The SARS-CoV-2 RNA is generally detectable in upper and lower respiratory specimens during the acute phase of infection. The lowest concentration of SARS-CoV-2 viral copies this assay can detect is 250 copies / mL. A negative result does not preclude SARS-CoV-2 infection and should not be used as the sole basis for treatment or other patient management decisions.  A negative result may occur with improper specimen collection / handling, submission of specimen other than nasopharyngeal swab, presence of viral mutation(s) within the areas targeted by this assay, and inadequate number of viral copies (<250 copies / mL). A negative result must be combined with clinical observations, patient history, and epidemiological information.  Fact Sheet for Patients:   BoilerBrush.com.cyhttps://www.fda.gov/media/136312/download  Fact Sheet for Healthcare Providers: https://pope.com/https://www.fda.gov/media/136313/download  This test is not yet approved or  cleared by the [place] FDA and has been authorized for detection and/or diagnosis of SARS-CoV-2 by FDA under an Emergency Use Authorization (EUA).  This EUA will remain in effect (meaning this test can be used) for the duration of the COVID-19 declaration under Section 564(b)(1) of the Act, 21 U.S.C. section 360bbb-3(b)(1), unless the authorization is terminated or revoked sooner.  Performed at [facility], [address] [place], [place] [address]   MRSA PCR Screening     Status: Abnormal   Collection Time: 04/28/20  7:45 PM   Specimen: Nasal Mucosa;  Nasopharyngeal  Result Value Ref Range Status   MRSA by PCR POSITIVE (A) NEGATIVE Final    Comment:        The GeneXpert MRSA Assay (FDA approved for NASAL specimens only), is one component of a comprehensive MRSA colonization surveillance program. It is not intended to diagnose MRSA infection nor to guide or monitor treatment for MRSA infections. RESULT CALLED TO, READ BACK BY AND VERIFIED WITH: S SMITH AT 2314 ON 04/28/2020 BY MOSLEY,J  Performed at [facility], [address] [place], [place] [address]   Culture, blood (Routine X 2) w Reflex to ID Panel     Status: None (Preliminary result)   Collection Time: 04/30/20  8:15 AM   Specimen: BLOOD  Result Value Ref Range Status   Specimen Description   Final    BLOOD LEFT ANTECUBITAL Performed at [facility], [address] [place], [place] [address]    Special Requests   Final    BOTTLES DRAWN AEROBIC AND  ANAEROBIC Blood Culture adequate volume Performed at [facility], [address] [place], [place] [address]    Culture   Final    NO GROWTH < 12 HOURS Performed at [facility], [address] [place], [place] [address]    Report Status PENDING  Incomplete  Culture, blood (Routine X 2) w Reflex to ID Panel     Status: None (Preliminary result)   Collection Time: 04/30/20  8:15 AM   Specimen: BLOOD LEFT FOREARM  Result Value Ref Range Status   Specimen Description   Final    BLOOD LEFT FOREARM Performed at [facility], [address] [place], [place] [address]    Special Requests   Final    BOTTLES DRAWN AEROBIC ONLY Blood Culture adequate volume Performed at [facility], [address], [place], [place] [address]    Culture   Final    NO GROWTH < 12 HOURS Performed at [facility], [address] [place], [place] [address]    Report Status PENDING  Incomplete     Radiology Studies: No results found. US  Abdomen Limited RUQ  Final Result    CT Abdomen Pelvis W Contrast  Final Result    CT Head Wo Contrast  Final Result    CT Cervical Spine Wo Contrast  Final Result    CT Chest W Contrast  Final Result    DG Chest Port 1 View  Final Result    DG Wrist Complete Right  Final Result    DG Hand Complete Right  Final Result    DG Hand Complete Left  Final Result    CT WRIST RIGHT W CONTRAST    (Results Pending)  CT HAND LEFT W CONTRAST    (Results Pending)     Scheduled Meds: . Chlorhexidine Gluconate Cloth  6 each Topical Q0600  . docusate sodium  100 mg Oral BID  . feeding supplement  1 Container Oral TID BM  . mouth rinse  15 mL Mouth Rinse BID  . mupirocin ointment  1 application Nasal BID  . sodium chloride (PF)      . sodium chloride flush  3 mL Intravenous Q12H  . vancomycin variable dose per unstable renal function (pharmacist dosing)   Does not apply See admin instructions   PRN Meds: acetaminophen **OR** acetaminophen, LORazepam, ondansetron **OR** ondansetron (ZOFRAN) IV, oxyCODONE Continuous Infusions:    LOS: 3 days  Time spent: Greater than 50% of the 35 minute visit was spent in counseling/coordination of care for the patient as laid out in the A&P.   Lewie Chamber, MD [facility] 05/01/2020, 1:42 PM   Contact via secure chat.  To contact the attending provider between 7A-7P or the covering provider during after hours 7P-7A, please log into the web site [email or web address] and access using universal [place] password for that web site. If you do not have the password, please call the hospital operator.

## 2020-05-01 NOTE — Assessment & Plan Note (Signed)
-   on 7/11, patient endorsed multiple homicidal ideations to nurse and CNA in room; he threatened that he was going "to kill the attending" and all their family and kids; this was voiced several times despite nursing attempting to re-direct patient - will request psych eval at this time; also have ordered sitter - he already has oxycodone for pain and opioid w/d as well as ativan for anxiety/agitation

## 2020-05-01 NOTE — Progress Notes (Signed)
Pharmacy Antibiotic Note  Clifford Clark is a 29 y.o. male admitted on 04/28/2020 with sepsis.  Pharmacy has been consulted for vancomycin dosing.  Pt has a history of IVDU, admitted with sepsis and possible endocarditis.   Today, 05/01/20  WBC 20.2  Pt most recent dose was 750 mg IV q8h after updated listed weight of 55 kg   Lactate 2.3 > 1.7 > 1.3   SCr 0.8 > 1.01 > 1.38, therefor vancomycin trough was obtained \  VT 31 this AM at 0958   Per discussion with MD, not quite convinced it is all due to vanc as pt also has septic emboli to kidneys     Plan:  Hold remaining vancomycin doses for today   Obtain random  vancomycin level with AM labs   Follow renal function and culture data. Goal VT 15-20 mcg/mL  Height: 5\' 7"  (170.2 cm) Weight: 55 kg (121 lb 4.1 oz) IBW/kg (Calculated) : 66.1  Temp (24hrs), Avg:98.3 F (36.8 C), Min:98.2 F (36.8 C), Max:98.7 F (37.1 C)  Recent Labs  Lab 04/28/20 1540 04/28/20 1545 04/28/20 1800 04/28/20 2224 04/29/20 0031 04/30/20 0815 05/01/20 0733 05/01/20 0955  WBC 21.5*  --   --   --  21.2* 18.1* 20.2*  --   CREATININE 0.78  --   --   --  0.81 1.01 1.38*  --   LATICACIDVEN  --  2.1* 2.3* 1.7 1.3  --   --   --   VANCOTROUGH  --   --   --   --   --   --   --  31*    Estimated Creatinine Clearance: 61.4 mL/min (A) (by C-G formula based on SCr of 1.38 mg/dL (H)).    No Known Allergies  Antimicrobials this admission: cefepime 7/8 >> 7/9 vancomycin 7/8 >>  Metronidazole 7/8 >> 7/9   Dose adjustments this admission:  Microbiology results: 7/8 BCx: 4/4 MRSA  7/8 UCx:  >=100,000 colonies of Staph ( pansens)  7/8 MRSA PCR: positive  7/8 COVID-19 negative  7/8 HIV:  negative  7/8 hepatitis panel: hep C Ab positive 7/9: HCV RNA quant: not elevated  7/10 BCx: NGTD  Thank you for allowing pharmacy to be a part of this patient's care.  9/9, PharmD, BCPS 05/01/2020 1:45 PM

## 2020-05-01 NOTE — Progress Notes (Signed)
While RN and NT were bathing pt, pt verbalized homicidal ideations toward several staff members and MD. Pt upset about medication dosage. Pt stated, "I will come back and kill several people here, and I will kill the doctor slowly. I will kill all their kids while they watch and eat their kids' hearts." Patient used many foul words during this conversation. RN reassured and met all needs for pt. RN notified charge RN, Lauretta Grill, pt's MD, and pt's mother. Violence assessment completed. New orders put in for homicidal Ideation sitter at bedside.

## 2020-05-02 ENCOUNTER — Inpatient Hospital Stay (HOSPITAL_COMMUNITY): Payer: Self-pay

## 2020-05-02 DIAGNOSIS — L02519 Cutaneous abscess of unspecified hand: Secondary | ICD-10-CM

## 2020-05-02 DIAGNOSIS — R799 Abnormal finding of blood chemistry, unspecified: Secondary | ICD-10-CM

## 2020-05-02 LAB — BLOOD GAS, ARTERIAL
Acid-Base Excess: 0.5 mmol/L (ref 0.0–2.0)
Bicarbonate: 22.3 mmol/L (ref 20.0–28.0)
FIO2: 21
O2 Saturation: 93.2 %
Patient temperature: 100.8
pCO2 arterial: 29 mmHg — ABNORMAL LOW (ref 32.0–48.0)
pH, Arterial: 7.503 — ABNORMAL HIGH (ref 7.350–7.450)
pO2, Arterial: 70.4 mmHg — ABNORMAL LOW (ref 83.0–108.0)

## 2020-05-02 LAB — CBC WITH DIFFERENTIAL/PLATELET
Abs Immature Granulocytes: 0.68 10*3/uL — ABNORMAL HIGH (ref 0.00–0.07)
Basophils Absolute: 0.1 10*3/uL (ref 0.0–0.1)
Basophils Relative: 0 %
Eosinophils Absolute: 0.1 10*3/uL (ref 0.0–0.5)
Eosinophils Relative: 0 %
HCT: 27.1 % — ABNORMAL LOW (ref 39.0–52.0)
Hemoglobin: 9.2 g/dL — ABNORMAL LOW (ref 13.0–17.0)
Immature Granulocytes: 4 %
Lymphocytes Relative: 9 %
Lymphs Abs: 1.7 10*3/uL (ref 0.7–4.0)
MCH: 27.8 pg (ref 26.0–34.0)
MCHC: 33.9 g/dL (ref 30.0–36.0)
MCV: 81.9 fL (ref 80.0–100.0)
Monocytes Absolute: 1.4 10*3/uL — ABNORMAL HIGH (ref 0.1–1.0)
Monocytes Relative: 7 %
Neutro Abs: 14.6 10*3/uL — ABNORMAL HIGH (ref 1.7–7.7)
Neutrophils Relative %: 80 %
Platelets: 140 10*3/uL — ABNORMAL LOW (ref 150–400)
RBC: 3.31 MIL/uL — ABNORMAL LOW (ref 4.22–5.81)
RDW: 15.2 % (ref 11.5–15.5)
WBC: 18.5 10*3/uL — ABNORMAL HIGH (ref 4.0–10.5)
nRBC: 0 % (ref 0.0–0.2)

## 2020-05-02 LAB — COMPREHENSIVE METABOLIC PANEL
ALT: 28 U/L (ref 0–44)
AST: 40 U/L (ref 15–41)
Albumin: 1.5 g/dL — ABNORMAL LOW (ref 3.5–5.0)
Alkaline Phosphatase: 92 U/L (ref 38–126)
Anion gap: 10 (ref 5–15)
BUN: 77 mg/dL — ABNORMAL HIGH (ref 6–20)
CO2: 21 mmol/L — ABNORMAL LOW (ref 22–32)
Calcium: 7.3 mg/dL — ABNORMAL LOW (ref 8.9–10.3)
Chloride: 97 mmol/L — ABNORMAL LOW (ref 98–111)
Creatinine, Ser: 1.21 mg/dL (ref 0.61–1.24)
GFR calc Af Amer: >60 mL/min (ref >60)
GFR calc non Af Amer: >60 mL/min (ref >60)
Glucose, Bld: 137 mg/dL — ABNORMAL HIGH (ref 70–99)
Potassium: 4.7 mmol/L (ref 3.5–5.1)
Sodium: 128 mmol/L — ABNORMAL LOW (ref 135–145)
Total Bilirubin: 0.6 mg/dL (ref 0.3–1.2)
Total Protein: 5.9 g/dL — ABNORMAL LOW (ref 6.5–8.1)

## 2020-05-02 LAB — MAGNESIUM: Magnesium: 2.6 mg/dL — ABNORMAL HIGH (ref 1.7–2.4)

## 2020-05-02 LAB — VANCOMYCIN, RANDOM: Vancomycin Rm: 8

## 2020-05-02 MED ORDER — SODIUM CHLORIDE 0.9 % IV BOLUS
500.0000 mL | Freq: Once | INTRAVENOUS | Status: AC
  Administered 2020-05-02: 500 mL via INTRAVENOUS

## 2020-05-02 MED ORDER — LORAZEPAM 2 MG/ML IJ SOLN
1.0000 mg | Freq: Once | INTRAMUSCULAR | Status: AC
  Administered 2020-05-02: 1 mg via INTRAVENOUS
  Filled 2020-05-02: qty 1

## 2020-05-02 MED ORDER — IPRATROPIUM-ALBUTEROL 0.5-2.5 (3) MG/3ML IN SOLN: 3.0000 mL | RESPIRATORY_TRACT | Status: DC | PRN

## 2020-05-02 MED ORDER — DIGOXIN 0.25 MG/ML IJ SOLN
0.2500 mg | INTRAMUSCULAR | Status: AC
  Administered 2020-05-02: 0.25 mg via INTRAVENOUS
  Filled 2020-05-02: qty 2

## 2020-05-02 MED ORDER — LACTATED RINGERS IV SOLN: INTRAVENOUS | Status: DC

## 2020-05-02 MED ORDER — VANCOMYCIN HCL 750 MG/150ML IV SOLN
750.0000 mg | Freq: Two times a day (BID) | INTRAVENOUS | Status: DC
  Administered 2020-05-02 – 2020-05-04 (×4): 750 mg via INTRAVENOUS
  Filled 2020-05-02 (×5): qty 150

## 2020-05-02 MED ORDER — DIGOXIN 0.25 MG/ML IJ SOLN: 0.2500 mg | Freq: Every day | INTRAMUSCULAR | Status: DC

## 2020-05-02 NOTE — Progress Notes (Signed)
Pharmacy Antibiotic Note  Clifford Clark is a 29 y.o. male admitted on 04/28/2020 with sepsis.  Pharmacy has been consulted for vancomycin dosing.  Pt has a history of IVDU, admitted with sepsis and possible endocarditis.   05/01/20  VT 31 this AM at 0958 = 31 mcg/ml  Lactate 2.3 > 1.7 > 1.3   SCr 0.8 > 1.01 > 1.38   Held Vancomycin  Per discussion with MD, not quite convinced it is all due to vanc as pt also has septic emboli to kidneys    Today, 05/02/20  WBC 20.2 > 18.5  Last Vancomycin dose 7/11 at 0200  Vancomycin random level 0820 = 8 mcg/ml  SCr 0.8 > 1.01 > 1.38 > 1.21   Plan:  Resume Vancomycin at 750mg  q12  Follow renal function and culture data. Goal VT 15-20 mcg/mL  Daily CMET, last 7/14  Height: 5\' 7"  (170.2 cm) Weight: 55 kg (121 lb 4.1 oz) IBW/kg (Calculated) : 66.1  Temp (24hrs), Avg:99 F (37.2 C), Min:98 F (36.7 C), Max:101.3 F (38.5 C)  Recent Labs  Lab 04/28/20 1540 04/28/20 1545 04/28/20 1800 04/28/20 2224 04/29/20 0031 04/30/20 0815 05/01/20 0733 05/01/20 0955 05/02/20 0127 05/02/20 0819  WBC 21.5*  --   --   --  21.2* 18.1* 20.2*  --  18.5*  --   CREATININE 0.78  --   --   --  0.81 1.01 1.38*  --  1.21  --   LATICACIDVEN  --  2.1* 2.3* 1.7 1.3  --   --   --   --   --   VANCOTROUGH  --   --   --   --   --   --   --  31*  --   --   VANCORANDOM  --   --   --   --   --   --   --   --   --  8    Estimated Creatinine Clearance: 70.1 mL/min (by C-G formula based on SCr of 1.21 mg/dL).    No Known Allergies  Antimicrobials this admission: cefepime 7/8 >> 7/9 vancomycin 7/8 >>  Metronidazole 7/8 >> 7/9   Dose adjustments this admission: 7/9 Received vanc 1000 mg x 2 before weight updated, adjusted to 750 mg IV q8h  7/11 VT 31 mcg/ml - hold rest of today's doses and get another level with AM labs  7/12 VR = 8, will adjust Vanc to 750mg  q12 Microbiology results: 7/8 BCx: BCID resulted 4/4 MRSA, BCx resulting as MSSA 7/8 UCx:   >100,000 colonies MSSA  7/8 MRSA PCR: positive  7/8 COVID-19 negative  7/8 HIV:  negative  7/8 hepatitis panel: hep C Ab positive 7/9: HCV RNA quant: not elevated  7/10 BCx: NGTD   Thank you for allowing pharmacy to be a part of this patient's care.  PharmD Pager [phone] 05/02/2020, 2:42 PM

## 2020-05-02 NOTE — Assessment & Plan Note (Signed)
-   BUN has been uptrending; creatinine stable - differential includes worsening renal function vs pre-renal state vs underlying GIB (Hgb has also downtrended from admission approx 4g); given widespread septic emboli, would be concerned for possible hemorrhagic conversion? (no obvious neuro deficits and no stroke seen on initial CT head on admission as well) - restart on IVF on 7/12 and monitor BUN response

## 2020-05-02 NOTE — Assessment & Plan Note (Signed)
-   bilateral abscesses on hands, s/p CT on 7/11 - appreciate hand surgery consult; tentative plan is I&D on 7/13 - NPO at [facility]

## 2020-05-02 NOTE — Progress Notes (Signed)
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  EMS was called and he was transported to the hospital.  He was found to be extremely disheveled/unkempt, mildly hypoxic, 86% on room air. He underwent work-up in the ER with multiple imaging studies including x-ray of his right hand and wrist, x-ray of left hand, CXR, CT chest/abdomen/pelvis, CT C-spine, and CT head.  There were no acute fractures in his bilateral upper extremities.  CT head also unremarkable.  Remainder of his imaging studies revealed what appeared to be septic pulmonary emboli as well as septic emboli involving both kidneys.   Cultures were drawn and he was started on vancomycin, cefepime, and Flagyl in the ER.  The morning following admission, 4/4 blood cultures were growing MRSA. TTE also performed on 7/9 revealed TV vegetation.   He also became irate and angry later in the morning of 7/11 and started voice threats to the staff, notably about his nurse and the attending physician, stating "I'm going to kill him slowly".  Psych was consulted and a sitter was requested.   On 05/01/20 he also developed worsening swelling in his right wrist and left hand. There was concern for septic joint or bursitis. A CT was ordered bilaterally and his case was discussed with hand surgery to further evaluate.  CT of hands did show bilateral abscesses. He was evaluated by surgery with plans for I&D on 7/13.    Interval History:  No events overnight.  Still lethargic and sleeping in bed this am but is cooperative and answers questions appropriately . Still complaining of pain all over.   [name]: Constitutional: positive for fatigue and malaise, negative for fevers and night sweats, Respiratory: positive for pleurisy/chest pain, Cardiovascular: negative for chest pain and Gastrointestinal: negative for abdominal pain  Assessment & Plan: IVDU (intravenous drug user) - patient endorses 4-5 times daily use of Fentanyl - he will need a lot of encouragement and addiction counseling prior to discharge - he is high risk for leaving AMA and for worsening infection and/or death if he does; called and spoke with his mom Clifford Clark on 04/29/20 and updated her as well as told her all this as well  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 Mild TR  - will obtain TEE to further evaluate; await further input on need for this per ID as well - ID following, appreciate assistance  - will need minimum 6 weeks IV vanc - trend BC until clear (7/10 cultures remain NGTD) prior to PICC placement; patient will need to remain in hospital for duration of treatment course; no indication for IVC if tries to leave AMA - other complaint to keep note of is ongoing back pain (diffuse, even paraspinal) so suspicion for discitis/OM is lower currently as pain is diffuse, however if does not improve may need to be further considered, especially if blood cultures are difficult to clear   Sepsis (HCC) - see MRSA  bacteremia  Acute metabolic encephalopathy - considered due to IV drugs as well as sepsis/infection - mentation has cleared and diet started   Thrombocytopenia (HCC) - likely due to acute illness/sepsis; cannot rule out other etiologies yet - trend CBC with infection treatment; slowly improving   Hyponatremia - replete and recheck as needed  Elevated LFTs - possibly due to current infection/sepsis vs risk for hepatitis from IV drug use -  hepatitis panel noted for hep C Ab positive - check HCV Quant VL: not elevated - LFTs improving with infection treatment  MRSA bacteremia - see IVDA and endocarditis - continue Vanc   Hypokalemia -Replete and recheck as needed  Malnutrition of moderate degree Per RD: "Moderate Malnutrition related to social / environmental circumstances as evidenced by energy intake < 75% for > or equal to 3 months, moderate fat depletion, moderate muscle depletion, percent weight loss." - appreciate assistance - diet advanced on 7/10  Threatening behavior - on 7/11, patient endorsed multiple homicidal ideations to nurse and CNA in room; he threatened that he was going "to kill the attending" and all their family and kids; this was voiced several times despite nursing attempting to re-direct patient - will request psych eval at this time; also have ordered sitter - he already has oxycodone for pain and opioid w/d as well as ativan for anxiety/agitation  Hand abscess - bilateral abscesses on hands, s/p CT on 7/11 - appreciate hand surgery consult; tentative plan is I&D on 7/13 - NPO at [facility]  Elevated BUN - BUN has been uptrending; creatinine stable - differential includes worsening renal function vs pre-renal state vs underlying GIB (Hgb has also downtrended from admission approx 4g); given widespread septic emboli, would be concerned for possible hemorrhagic conversion? (no obvious neuro deficits and no stroke seen on initial CT head on admission as well) - restart on IVF on 7/12 and monitor BUN response   Antimicrobials: Vanc 04/28/20 >> present Cefepime 04/28/20 - 04/29/20 Flagyl 04/28/20 - 04/29/20  DVT prophylaxis: Lovenox Code Status: Full Family Communication: Mother Disposition Plan:  Status is: Inpatient  Remains inpatient appropriate because:Ongoing active pain requiring inpatient pain management, Unsafe d/c plan, IV treatments appropriate due to intensity of illness or inability to take PO  and Inpatient level of care appropriate due to severity of illness   Dispo: The patient is from: Home              Anticipated d/c is to: Home              Anticipated d/c date is: > 3 days              Patient currently is not medically stable to d/c.       Objective: Blood pressure 121/75, pulse (!) 116, temperature 98 F (36.7 C), temperature source Oral, resp. rate (!) 28, height 5\' 7"  (1.702 m), weight 55 kg, SpO2 97 %.   Intake/Output Summary (Last 24 hours) at 05/02/2020 1241 Last data filed at 05/02/2020 1015 Gross per 24 hour  Intake 1147.73 ml  Output 976 ml  Net 171.73 ml   Last Weight  Most recent update: 04/28/2020  9:43 PM   Weight  55 kg (121 lb 4.1 oz)           Examination: General appearance:  Young man resting in bed comfortable and in no obvious distress; complains of pain all over and remains sleepy and lethargic  Head: Normocephalic, without obvious abnormality Eyes: EOMI Lungs: clear to auscultation  bilaterally Heart: regular rate and rhythm and S1, S2 normal Abdomen: normal findings: bowel sounds normal and soft, non-tender Extremities: covered in dirt; do not apprecaite any splinter hemorrhages or Osler notes/Janeway; right wrist now notable for palpable fluctuance over the dorsal ulnar aspect and is mildly tender to palpation.  Left dorsal hand also noted with small area of swelling, also tender to palpation.  No lower extremity edema Back: tenderness throughout (to include paraspinal muscles as well as over spine from cervical to lower lumbar) Skin: covered in dirt Neurologic: Grossly normal   Consultants:   ID  Hand surgery  Procedures:   TTE 04/29/2020  Data Reviewed: I have personally reviewed following labs and imaging studies Results for orders placed or performed during the hospital encounter of 04/28/20 (from the past 24 hour(s))  CBC with Differential/Platelet     Status: Abnormal   Collection Time: 05/02/20  1:27 AM  Result Value  Ref Range   WBC 18.5 (H) 4.0 - 10.5 K/uL   RBC 3.31 (L) 4.22 - 5.81 MIL/uL   Hemoglobin 9.2 (L) 13.0 - 17.0 g/dL   HCT 16.1 (L) 39 - 52 %   MCV 81.9 80.0 - 100.0 fL   MCH 27.8 26.0 - 34.0 pg   MCHC 33.9 30.0 - 36.0 g/dL   RDW 09.6 04.5 - 40.9 %   Platelets 140 (L) 150 - 400 K/uL   nRBC 0.0 0.0 - 0.2 %   Neutrophils Relative % 80 %   Neutro Abs 14.6 (H) 1.7 - 7.7 K/uL   Lymphocytes Relative 9 %   Lymphs Abs 1.7 0.7 - 4.0 K/uL   Monocytes Relative 7 %   Monocytes Absolute 1.4 (H) 0 - 1 K/uL   Eosinophils Relative 0 %   Eosinophils Absolute 0.1 0 - 0 K/uL   Basophils Relative 0 %   Basophils Absolute 0.1 0 - 0 K/uL   Immature Granulocytes 4 %   Abs Immature Granulocytes 0.68 (H) 0.00 - 0.07 K/uL  Comprehensive metabolic panel     Status: Abnormal   Collection Time: 05/02/20  1:27 AM  Result Value Ref Range   Sodium 128 (L) 135 - 145 mmol/L   Potassium 4.7 3.5 - 5.1 mmol/L   Chloride 97 (L) 98 - 111 mmol/L   CO2 21 (L) 22 - 32 mmol/L   Glucose, Bld 137 (H) 70 - 99 mg/dL   BUN 77 (H) 6 - 20 mg/dL   Creatinine, Ser 8.11 0.61 - 1.24 mg/dL   Calcium 7.3 (L) 8.9 - 10.3 mg/dL   Total Protein 5.9 (L) 6.5 - 8.1 g/dL   Albumin 1.5 (L) 3.5 - 5.0 g/dL   AST 40 15 - 41 U/L   ALT 28 0 - 44 U/L   Alkaline Phosphatase 92 38 - 126 U/L   Total Bilirubin 0.6 0.3 - 1.2 mg/dL   GFR calc non Af Amer >60 >60 mL/min   GFR calc Af Amer >60 >60 mL/min   Anion gap 10 5 - 15  Magnesium     Status: Abnormal   Collection Time: 05/02/20  1:27 AM  Result Value Ref Range   Magnesium 2.6 (H) 1.7 - 2.4 mg/dL    Recent Results (from the past 240 hour(s))  Culture, blood (routine x 2)     Status: Abnormal (Preliminary result)   Collection Time: 04/28/20  3:45 PM   Specimen: BLOOD  Result Value Ref Range Status   Specimen Description   Final    BLOOD  LEFT ANTECUBITAL Performed at [facility], [address], [place], [place] [address]    Special Requests   Final    BOTTLES DRAWN  AEROBIC AND ANAEROBIC Blood Culture adequate volume Performed at [facility], [address] [place], [place] [address]    Culture  Setup Time   Final    GRAM POSITIVE COCCI IN BOTH AEROBIC AND ANAEROBIC BOTTLES CRITICAL RESULT CALLED TO, READ BACK BY AND VERIFIED WITH: PHARMD Lynder Parents [phone] FCP    Culture (A)  Final    STAPHYLOCOCCUS AUREUS SUSCEPTIBILITIES TO FOLLOW Performed at [facility], [address], [place], [place] [address]    Report Status PENDING  Incomplete  Blood Culture ID Panel (Reflexed)     Status: Abnormal   Collection Time: 04/28/20  3:45 PM  Result Value Ref Range Status   Enterococcus species NOT DETECTED NOT DETECTED Final   Listeria monocytogenes NOT DETECTED NOT DETECTED Final   Staphylococcus species DETECTED (A) NOT DETECTED Final    Comment: CRITICAL RESULT CALLED TO, READ BACK BY AND VERIFIED WITH: PHARMD Lynder Parents [phone] FCP    Staphylococcus aureus (BCID) DETECTED (A) NOT DETECTED Final    Comment: Methicillin (oxacillin)-resistant Staphylococcus aureus (MRSA). MRSA is predictably resistant to beta-lactam antibiotics (except ceftaroline). Preferred therapy is vancomycin unless clinically contraindicated. Patient requires contact precautions if  hospitalized. CRITICAL RESULT CALLED TO, READ BACK BY AND VERIFIED WITH: PHARMD M. Derrick Ravel [phone] FCP    Methicillin resistance DETECTED (A) NOT DETECTED Final    Comment: CRITICAL RESULT CALLED TO, READ BACK BY AND VERIFIED WITH: PHARMD M. RENZ [id number] [phone] FCP    Streptococcus species NOT DETECTED NOT DETECTED Final   Streptococcus agalactiae NOT DETECTED NOT DETECTED Final   Streptococcus pneumoniae NOT DETECTED NOT DETECTED Final   Streptococcus pyogenes NOT DETECTED NOT DETECTED Final   Acinetobacter baumannii NOT DETECTED NOT DETECTED Final   Enterobacteriaceae species NOT DETECTED NOT DETECTED Final   Enterobacter cloacae complex NOT DETECTED NOT DETECTED Final     Escherichia coli NOT DETECTED NOT DETECTED Final   Klebsiella oxytoca NOT DETECTED NOT DETECTED Final   Klebsiella pneumoniae NOT DETECTED NOT DETECTED Final   Proteus species NOT DETECTED NOT DETECTED Final   Serratia marcescens NOT DETECTED NOT DETECTED Final   Haemophilus influenzae NOT DETECTED NOT DETECTED Final   Neisseria meningitidis NOT DETECTED NOT DETECTED Final   Pseudomonas aeruginosa NOT DETECTED NOT DETECTED Final   Candida albicans NOT DETECTED NOT DETECTED Final   Candida glabrata NOT DETECTED NOT DETECTED Final   Candida krusei NOT DETECTED NOT DETECTED Final   Candida parapsilosis NOT DETECTED NOT DETECTED Final   Candida tropicalis NOT DETECTED NOT DETECTED Final    Comment: Performed at [facility], [address] [place], [place] [address]  Culture, blood (routine x 2)     Status: Abnormal   Collection Time: 04/28/20  4:13 PM   Specimen: BLOOD  Result Value Ref Range Status   Specimen Description   Final    BLOOD RIGHT ANTECUBITAL Performed at [facility], [address] [place], [place] [address]    Special Requests   Final    BOTTLES DRAWN AEROBIC AND ANAEROBIC Blood Culture adequate volume Performed at [facility], [address], [place], [place] [address]    Culture  Setup Time   Final    GRAM POSITIVE COCCI IN BOTH AEROBIC AND ANAEROBIC BOTTLES CRITICAL VALUE NOTED.  VALUE IS CONSISTENT WITH PREVIOUSLY REPORTED AND CALLED VALUE. Performed at [facility], [address] [place], [place] [address]    Culture STAPHYLOCOCCUS AUREUS (A)  Final   Report Status 05/02/2020 FINAL  Final   Organism ID, Bacteria STAPHYLOCOCCUS AUREUS  Final      Susceptibility   Staphylococcus aureus - MIC*    CIPROFLOXACIN <=0.5 SENSITIVE Sensitive     ERYTHROMYCIN >=8 RESISTANT Resistant     GENTAMICIN <=0.5 SENSITIVE Sensitive     OXACILLIN 0.5 SENSITIVE Sensitive     TETRACYCLINE <=1 SENSITIVE Sensitive      VANCOMYCIN <=0.5 SENSITIVE Sensitive     TRIMETH/SULFA <=10 SENSITIVE Sensitive     CLINDAMYCIN <=0.25 SENSITIVE Sensitive     RIFAMPIN <=0.5 SENSITIVE Sensitive     Inducible Clindamycin NEGATIVE Sensitive     * STAPHYLOCOCCUS AUREUS  Urine culture     Status: Abnormal   Collection Time: 04/28/20  7:00 PM   Specimen: In/Out Cath Urine  Result Value Ref Range Status   Specimen Description   Final    IN/OUT CATH URINE Performed at [facility], [address] [place], [place] [address]    Special Requests   Final    NONE Performed at [facility], [address] [place], [place] [address]    Culture >=100,000 COLONIES/mL STAPHYLOCOCCUS AUREUS (A)  Final   Report Status 05/01/2020 FINAL  Final   Organism ID, Bacteria STAPHYLOCOCCUS AUREUS (A)  Final      Susceptibility   Staphylococcus aureus - MIC*    CIPROFLOXACIN <=0.5 SENSITIVE Sensitive     GENTAMICIN <=0.5 SENSITIVE Sensitive     NITROFURANTOIN <=16 SENSITIVE Sensitive     OXACILLIN 0.5 SENSITIVE Sensitive     TETRACYCLINE <=1 SENSITIVE Sensitive     VANCOMYCIN <=0.5 SENSITIVE Sensitive     TRIMETH/SULFA <=10 SENSITIVE Sensitive     CLINDAMYCIN <=0.25 SENSITIVE Sensitive     RIFAMPIN <=0.5 SENSITIVE Sensitive     Inducible Clindamycin NEGATIVE Sensitive     * >=100,000 COLONIES/mL STAPHYLOCOCCUS AUREUS  SARS Coronavirus 2 by RT PCR (hospital order, performed in [facility] hospital lab) Nasopharyngeal Nasopharyngeal Swab     Status: None   Collection Time: 04/28/20  7:03 PM   Specimen: Nasopharyngeal Swab  Result Value Ref Range Status   SARS Coronavirus 2 NEGATIVE NEGATIVE Final    Comment: (NOTE) SARS-CoV-2 target nucleic acids are NOT DETECTED.  The SARS-CoV-2 RNA is generally detectable in upper and lower respiratory specimens during the acute phase of infection. The lowest concentration of SARS-CoV-2 viral copies this assay can detect is 250 copies / mL. A negative result  does not preclude SARS-CoV-2 infection and should not be used as the sole basis for treatment or other patient management decisions.  A negative result may occur with improper specimen collection / handling, submission of specimen other than nasopharyngeal swab, presence of viral mutation(s) within the areas targeted by this assay, and inadequate number of viral copies (<250 copies / mL). A negative result must be combined with clinical observations, patient history, and epidemiological information.  Fact Sheet for Patients:   BoilerBrush.com.cyhttps://www.fda.gov/media/136312/download  Fact Sheet for Healthcare Providers: https://pope.com/https://www.fda.gov/media/136313/download  This test is not yet approved or  cleared by the [place] FDA and has been authorized for detection and/or diagnosis of SARS-CoV-2 by FDA under an Emergency Use Authorization (EUA).  This EUA will remain in effect (meaning this test can be used) for the duration of the COVID-19  declaration under Section 564(b)(1) of the Act, 21 U.S.C. section 360bbb-3(b)(1), unless the authorization is terminated or revoked sooner.  Performed at [facility], [address], [place], [place] [address]   MRSA PCR Screening     Status: Abnormal   Collection Time: 04/28/20  7:45 PM   Specimen: Nasal Mucosa; Nasopharyngeal  Result Value Ref Range Status   MRSA by PCR POSITIVE (A) NEGATIVE Final    Comment:        The GeneXpert MRSA Assay (FDA approved for NASAL specimens only), is one component of a comprehensive MRSA colonization surveillance program. It is not intended to diagnose MRSA infection nor to guide or monitor treatment for MRSA infections. RESULT CALLED TO, READ BACK BY AND VERIFIED WITH: S SMITH AT 2314 ON 04/28/2020 BY MOSLEY,J  Performed at [facility], [address], [place], [place] [address]   Culture, blood (Routine X 2) w Reflex to ID Panel     Status: None (Preliminary result)    Collection Time: 04/30/20  8:15 AM   Specimen: BLOOD  Result Value Ref Range Status   Specimen Description   Final    BLOOD LEFT ANTECUBITAL Performed at [facility], [address], [place], [place] [address]    Special Requests   Final    BOTTLES DRAWN AEROBIC AND ANAEROBIC Blood Culture adequate volume Performed at [facility], [address], [place], [place] [address]    Culture   Final    NO GROWTH 1 DAY Performed at [facility], [address], [place], [place] [address]    Report Status PENDING  Incomplete  Culture, blood (Routine X 2) w Reflex to ID Panel     Status: None (Preliminary result)   Collection Time: 04/30/20  8:15 AM   Specimen: BLOOD LEFT FOREARM  Result Value Ref Range Status   Specimen Description   Final    BLOOD LEFT FOREARM Performed at [facility], [address] [place], [place] [address]    Special Requests   Final    BOTTLES DRAWN AEROBIC ONLY Blood Culture adequate volume Performed at [facility], [address], [place], [place] [address]    Culture   Final    NO GROWTH 1 DAY Performed at [facility], [address], [place], [place] [address]    Report Status PENDING  Incomplete     Radiology Studies: CT WRIST RIGHT W CONTRAST  Result Date: 05/01/2020 CLINICAL DATA:  Wrist pain and swelling. EXAM: CT OF THE UPPER RIGHT EXTREMITY WITH CONTRAST TECHNIQUE: Multidetector CT imaging of the upper right extremity was performed according to the standard protocol following intravenous contrast administration. COMPARISON:  Radiographs same date. CONTRAST:  OMNIPAQUE IOHEXOL 300 MG/ML  SOLN FINDINGS: The bony structures are intact. No fractures or destructive bony changes. The joint spaces are maintained. Focal soft tissue thickening and suspected subcutaneous abscess wrapping around the dorsal and ulnar aspect of the distal ulna. This measures a maximum of 2.7 cm. I do  not see any definite findings for septic arthritis or osteomyelitis. Diffuse subcutaneous soft tissue swelling/edema suggesting cellulitis. IMPRESSION: 1. Focal soft tissue thickening and suspected subcutaneous abscess wrapping around the dorsal and ulnar aspect of the distal ulna. 2. Diffuse subcutaneous soft tissue swelling/edema suggesting cellulitis. 3. No definite CT findings for septic arthritis or osteomyelitis. Electronically Signed   By: Rudie Meyer M.D.   On: 05/01/2020 13:42  CT HAND LEFT W CONTRAST  Result Date: 05/01/2020 CLINICAL DATA:  Pain and swelling. EXAM: CT OF THE UPPER LEFT EXTREMITY WITH CONTRAST TECHNIQUE: Multidetector CT imaging of the upper left extremity was performed according to the standard protocol following intravenous contrast administration. CONTRAST:  OMNIPAQUE IOHEXOL 300 MG/ML  SOLN COMPARISON:  Radiographs, same date. FINDINGS: Marked soft tissue thickening noted over the dorsum of the hand overlying mainly the second and third MCP joints. There is a rim enhancing abscess noted overlying the second MCP joint which measures a maximum of 2.8 cm. There is also diffuse cellulitis and suspected myositis. I do not see any obvious destructive bony changes to suggest septic arthritis or osteomyelitis but MRI would be much more sensitive for these 2 entities. IMPRESSION: 1. 2.8 cm rim enhancing abscess overlying the second MCP joint. 2. Diffuse cellulitis and suspected myositis. 3. No obvious destructive bony changes to suggest septic arthritis or osteomyelitis but MRI would be much more sensitive for these 2 entities. Electronically Signed   By: Rudie Meyer M.D.   On: 05/01/2020 13:49   CT WRIST RIGHT W CONTRAST  Final Result    CT HAND LEFT W CONTRAST  Final Result    US Abdomen Limited RUQ  Final Result    CT Abdomen Pelvis W Contrast  Final Result    CT Head Wo Contrast  Final Result    CT Cervical Spine Wo Contrast  Final Result    CT Chest W  Contrast  Final Result    DG Chest Port 1 View  Final Result    DG Wrist Complete Right  Final Result    DG Hand Complete Right  Final Result    DG Hand Complete Left  Final Result       Scheduled Meds: . Chlorhexidine Gluconate Cloth  6 each Topical Q0600  . docusate sodium  100 mg Oral BID  . enoxaparin (LOVENOX) injection  40 mg Subcutaneous Q24H  . feeding supplement  1 Container Oral TID BM  . mouth rinse  15 mL Mouth Rinse BID  . mupirocin ointment  1 application Nasal BID  . sodium chloride flush  3 mL Intravenous Q12H  . vancomycin variable dose per unstable renal function (pharmacist dosing)   Does not apply See admin instructions   PRN Meds: acetaminophen **OR** acetaminophen, LORazepam, ondansetron **OR** ondansetron (ZOFRAN) IV, oxyCODONE Continuous Infusions: . lactated ringers 125 mL/hr at 05/02/20 0841      LOS: 4 days  Time spent: Greater than 50% of the 35 minute visit was spent in counseling/coordination of care for the patient as laid out in the A&P.   Lewie Chamber, MD [facility] 05/02/2020, 12:41 PM   Contact via secure chat.  To contact the attending provider between 7A-7P or the covering provider during after hours 7P-7A, please log into the web site [email or web address] and access using universal [place] password for that web site. If you do not have the password, please call the hospital operator.

## 2020-05-02 NOTE — H&P (View-Only) (Signed)
Reason for Consult:Bilateral hand infections °Referring Physician: D Girguis ° °Clifford Clark is an 29 y.o. male.  °HPI: Clifford Clark was admitted 7/9 with IE 2/2 IVDU. He also has had bilateral hand and wrist pain that he says has been going on for about a week. He underwent workup that eventually included CT scans of both that showed abscesses and hand surgery was consulted. He is LHD and does not work. ° °Past Medical History:  °Diagnosis Date  °• Asthma   °• Opiate abuse, continuous (HCC)   ° Heroin  ° ° °History reviewed. No pertinent surgical history. ° °No family history on file. ° °Social History:  reports that he has quit smoking. He smoked 0.50 packs per day. He has never used smokeless tobacco. He reports previous alcohol use. He reports previous drug use. Drugs: Marijuana, IV, "Crack" cocaine, Cocaine, Fentanyl, Methamphetamines, and Amphetamines. ° °Allergies: No Known Allergies ° °Medications: I have reviewed the patient's current medications. ° °Results for orders placed or performed during the hospital encounter of 04/28/20 (from the past 48 hour(s))  °CBC with Differential/Platelet     Status: Abnormal  ° Collection Time: 05/01/20  7:33 AM  °Result Value Ref Range  ° WBC 20.2 (H) 4.0 - 10.5 K/uL  ° RBC 3.59 (L) 4.22 - 5.81 MIL/uL  ° Hemoglobin 9.9 (L) 13.0 - 17.0 g/dL  ° HCT 29.2 (L) 39 - 52 %  ° MCV 81.3 80.0 - 100.0 fL  ° MCH 27.6 26.0 - 34.0 pg  ° MCHC 33.9 30.0 - 36.0 g/dL  ° RDW 15.1 11.5 - 15.5 %  ° Platelets 112 (L) 150 - 400 K/uL  °  Comment: CONSISTENT WITH PREVIOUS RESULT °Immature Platelet Fraction may be °clinically indicated, consider °ordering this additional test °[id number] °  ° nRBC 0.0 0.0 - 0.2 %  ° Neutrophils Relative % 80 %  ° Neutro Abs 16.1 (H) 1.7 - 7.7 K/uL  ° Lymphocytes Relative 9 %  ° Lymphs Abs 1.8 0.7 - 4.0 K/uL  ° Monocytes Relative 7 %  ° Monocytes Absolute 1.4 (H) 0 - 1 K/uL  ° Eosinophils Relative 0 %  ° Eosinophils Absolute 0.1 0 - 0 K/uL  ° Basophils Relative 0 %  °  Basophils Absolute 0.1 0 - 0 K/uL  ° Immature Granulocytes 4 %  ° Abs Immature Granulocytes 0.76 (H) 0.00 - 0.07 K/uL  °  Comment: Performed at [facility], [address] [place], [place] [address]  °Comprehensive metabolic panel     Status: Abnormal  ° Collection Time: 05/01/20  7:33 AM  °Result Value Ref Range  ° Sodium 128 (L) 135 - 145 mmol/L  ° Potassium 4.2 3.5 - 5.1 mmol/L  ° Chloride 97 (L) 98 - 111 mmol/L  ° CO2 23 22 - 32 mmol/L  ° Glucose, Bld 92 70 - 99 mg/dL  °  Comment: Glucose reference range applies only to samples taken after fasting for at least 8 hours.  ° BUN 70 (H) 6 - 20 mg/dL  ° Creatinine, Ser 1.38 (H) 0.61 - 1.24 mg/dL  ° Calcium 7.3 (L) 8.9 - 10.3 mg/dL  ° Total Protein 5.7 (L) 6.5 - 8.1 g/dL  ° Albumin 1.6 (L) 3.5 - 5.0 g/dL  ° AST 37 15 - 41 U/L  ° ALT 29 0 - 44 U/L  ° Alkaline Phosphatase 104 38 - 126 U/L  ° Total Bilirubin 1.0 0.3 - 1.2 mg/dL  ° GFR calc non Af Amer >60 >60   mL/min  ° GFR calc Af Amer >60 >60 mL/min  ° Anion gap 8 5 - 15  °  Comment: Performed at [facility], [address] [place], [place] [address]  °Magnesium     Status: None  ° Collection Time: 05/01/20  7:33 AM  °Result Value Ref Range  ° Magnesium 2.4 1.7 - 2.4 mg/dL  °  Comment: Performed at [facility], [address] [place], [place] [address]  °Vancomycin, trough     Status: Abnormal  ° Collection Time: 05/01/20  9:55 AM  °Result Value Ref Range  ° Vancomycin Tr 31 (HH) 15 - 20 ug/mL  °  Comment: CRITICAL RESULT CALLED TO, READ BACK BY AND VERIFIED WITH: °IRSA HABIB [email or web address] ON 7.11.21 BY TCALDWELL MT. °Performed at [facility], [address] [place], [place] [address] °  °CBC with Differential/Platelet     Status: Abnormal  ° Collection Time: 05/02/20  1:27 AM  °Result Value Ref Range  ° WBC 18.5 (H) 4.0 - 10.5 K/uL  ° RBC 3.31 (L) 4.22 - 5.81 MIL/uL  ° Hemoglobin 9.2 (L) 13.0 - 17.0 g/dL  ° HCT 27.1 (L) 39 - 52 %  ° MCV 81.9 80.0 - 100.0  fL  ° MCH 27.8 26.0 - 34.0 pg  ° MCHC 33.9 30.0 - 36.0 g/dL  ° RDW 15.2 11.5 - 15.5 %  ° Platelets 140 (L) 150 - 400 K/uL  ° nRBC 0.0 0.0 - 0.2 %  ° Neutrophils Relative % 80 %  ° Neutro Abs 14.6 (H) 1.7 - 7.7 K/uL  ° Lymphocytes Relative 9 %  ° Lymphs Abs 1.7 0.7 - 4.0 K/uL  ° Monocytes Relative 7 %  ° Monocytes Absolute 1.4 (H) 0 - 1 K/uL  ° Eosinophils Relative 0 %  ° Eosinophils Absolute 0.1 0 - 0 K/uL  ° Basophils Relative 0 %  ° Basophils Absolute 0.1 0 - 0 K/uL  ° Immature Granulocytes 4 %  ° Abs Immature Granulocytes 0.68 (H) 0.00 - 0.07 K/uL  °  Comment: Performed at [facility], [address] [place], [place] [address]  °Comprehensive metabolic panel     Status: Abnormal  ° Collection Time: 05/02/20  1:27 AM  °Result Value Ref Range  ° Sodium 128 (L) 135 - 145 mmol/L  ° Potassium 4.7 3.5 - 5.1 mmol/L  ° Chloride 97 (L) 98 - 111 mmol/L  ° CO2 21 (L) 22 - 32 mmol/L  ° Glucose, Bld 137 (H) 70 - 99 mg/dL  °  Comment: Glucose reference range applies only to samples taken after fasting for at least 8 hours.  ° BUN 77 (H) 6 - 20 mg/dL  ° Creatinine, Ser 1.21 0.61 - 1.24 mg/dL  ° Calcium 7.3 (L) 8.9 - 10.3 mg/dL  ° Total Protein 5.9 (L) 6.5 - 8.1 g/dL  ° Albumin 1.5 (L) 3.5 - 5.0 g/dL  ° AST 40 15 - 41 U/L  ° ALT 28 0 - 44 U/L  ° Alkaline Phosphatase 92 38 - 126 U/L  ° Total Bilirubin 0.6 0.3 - 1.2 mg/dL  ° GFR calc non Af Amer >60 >60 mL/min  ° GFR calc Af Amer >60 >60 mL/min  ° Anion gap 10 5 - 15  °  Comment: Performed at [facility], [address] , [place] [address]  °Magnesium     Status: Abnormal  ° Collection Time: 05/02/20  1:27 AM  °Result Value   Ref Range  ° Magnesium 2.6 (H) 1.7 - 2.4 mg/dL  °  Comment: Performed at [facility], [address] [place], [place] [address]  ° ° °CT WRIST RIGHT W CONTRAST ° °Result Date: 05/01/2020 °CLINICAL DATA:  Wrist pain and swelling. EXAM: CT OF THE UPPER RIGHT EXTREMITY WITH CONTRAST TECHNIQUE:  Multidetector CT imaging of the upper right extremity was performed according to the standard protocol following intravenous contrast administration. COMPARISON:  Radiographs same date. CONTRAST:  100mL OMNIPAQUE IOHEXOL 300 MG/ML  SOLN FINDINGS: The bony structures are intact. No fractures or destructive bony changes. The joint spaces are maintained. Focal soft tissue thickening and suspected subcutaneous abscess wrapping around the dorsal and ulnar aspect of the distal ulna. This measures a maximum of 2.7 cm. I do not see any definite findings for septic arthritis or osteomyelitis. Diffuse subcutaneous soft tissue swelling/edema suggesting cellulitis. IMPRESSION: 1. Focal soft tissue thickening and suspected subcutaneous abscess wrapping around the dorsal and ulnar aspect of the distal ulna. 2. Diffuse subcutaneous soft tissue swelling/edema suggesting cellulitis. 3. No definite CT findings for septic arthritis or osteomyelitis. Electronically Signed   By: P.  Gallerani M.D.   On: 05/01/2020 13:42  ° °CT HAND LEFT W CONTRAST ° °Result Date: 05/01/2020 °CLINICAL DATA:  Pain and swelling. EXAM: CT OF THE UPPER LEFT EXTREMITY WITH CONTRAST TECHNIQUE: Multidetector CT imaging of the upper left extremity was performed according to the standard protocol following intravenous contrast administration. CONTRAST:  100mL OMNIPAQUE IOHEXOL 300 MG/ML  SOLN COMPARISON:  Radiographs, same date. FINDINGS: Marked soft tissue thickening noted over the dorsum of the hand overlying mainly the second and third MCP joints. There is a rim enhancing abscess noted overlying the second MCP joint which measures a maximum of 2.8 cm. There is also diffuse cellulitis and suspected myositis. I do not see any obvious destructive bony changes to suggest septic arthritis or osteomyelitis but MRI would be much more sensitive for these 2 entities. IMPRESSION: 1. 2.8 cm rim enhancing abscess overlying the second MCP joint. 2. Diffuse cellulitis and  suspected myositis. 3. No obvious destructive bony changes to suggest septic arthritis or osteomyelitis but MRI would be much more sensitive for these 2 entities. Electronically Signed   By: P.  Gallerani M.D.   On: 05/01/2020 13:49  ° ° °Review of Systems  °Constitutional: Positive for chills, diaphoresis and fever.  °HENT: Negative for ear discharge, ear pain, hearing loss and tinnitus.   °Eyes: Negative for photophobia and pain.  °Respiratory: Negative for cough and shortness of breath.   °Cardiovascular: Negative for chest pain.  °Gastrointestinal: Negative for abdominal pain, nausea and vomiting.  °Genitourinary: Negative for dysuria, flank pain, frequency and urgency.  °Musculoskeletal: Positive for arthralgias (Bilateral hands/wrists). Negative for back pain, myalgias and neck pain.  °Neurological: Negative for dizziness and headaches.  °Hematological: Does not bruise/bleed easily.  °Psychiatric/Behavioral: The patient is not nervous/anxious.   ° °Blood pressure 113/75, pulse (!) 115, temperature 98 °F (36.7 °C), temperature source Oral, resp. rate (!) 49, height 5' 7" (1.702 m), weight 55 kg, SpO2 97 %. °Physical Exam °Constitutional:   °   General: He is not in acute distress. °   Appearance: He is well-developed. He is not diaphoretic.  °HENT:  °   Head: Normocephalic and atraumatic.  °Eyes:  °   General: No scleral icterus.    °   Right eye: No discharge.     °   Left eye: No discharge.  °     Conjunctiva/sclera: Conjunctivae normal.  °Cardiovascular:  °   Rate and Rhythm: Normal rate and regular rhythm.  °Pulmonary:  °   Effort: Pulmonary effort is normal. No respiratory distress.  °Musculoskeletal:  °   Cervical back: Normal range of motion.  °   Comments: Right shoulder, elbow, wrist, digits- no skin wounds, TTP ulnar wrist, no instability, no blocks to motion ° Sens  Ax/R/M/U intact ° Mot   Ax/ R/ PIN/ M/ AIN/ U intact ° Rad 2+ ° °Left shoulder, elbow, wrist, digits- no skin wounds, diffuse TTP,  discoloration over 2nd MCP, no instability, no blocks to motion ° Sens  Ax/R/M/U intact ° Mot   Ax/ R/ PIN/ M/ AIN/ intact, ?U weak ° Rad 2+  °Skin: °   General: Skin is warm and dry.  °Neurological:  °   Mental Status: He is alert.  °Psychiatric:     °   Behavior: Behavior normal.  ° ° ° °Assessment/Plan: °Right wrist and left hand abscesses -- Plan I&D tomorrow by Dr. Murphy. Please keep NPO after MN. °Multiple medical problems including IVDU, IE, sepsis, thrombocytopenia, and hyponatremia -- per primary service ° ° ° °Millee Denise J. Angelle Isais, PA-C °Orthopedic Surgery °[phone] °05/02/2020, 10:11 AM  °

## 2020-05-02 NOTE — Progress Notes (Signed)
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 Evaluated by ortho for I x D tomorrow. Seen by psychiatry for suicide ideation  Remains on tele- sustained sinus tachycardia  Had TTE - showing veg on TV, abn AV and possible veg on pulmonic valve. Also imaging of right wrist and left hand with evidence of abscess.  Medications:  . Chlorhexidine Gluconate Cloth  6 each Topical Q0600  . docusate sodium  100 mg Oral BID  . enoxaparin (LOVENOX) injection  40 mg Subcutaneous Q24H  . feeding supplement  1 Container Oral TID BM  . mouth rinse  15 mL Mouth Rinse BID  . mupirocin ointment  1 application Nasal BID  . sodium chloride flush  3 mL Intravenous Q12H  . vancomycin variable dose per unstable renal function (pharmacist dosing)   Does not apply See admin instructions    Objective: Vital signs in last 24 hours: Temp:  [98 F (36.7 C)-101.3 F (38.5 C)] 98.2 F (36.8 C) (07/12 1335) Pulse Rate:  [53-131] 119 (07/12 1400) Resp:  [20-62] 34 (07/12 1400) BP: (113-136)/(66-88) 125/88 (07/12 1400) SpO2:  [97 %-100 %] 97 % (07/12 0802) Physical Exam  Constitutional: He is oriented to person, only. He appears well-developed and well-nourished. No distress.  HENT: dry oral mucosa Mouth/Throat: Oropharynx is clear and moist. No oropharyngeal exudate.  Cardiovascular: tachycardic, regular rhythm and normal heart sounds. Exam reveals no gallop and no friction rub.  No murmur heard.  Pulmonary/Chest: Effort normal and breath sounds normal. No  respiratory distress. He has no wheezes.  Abdominal: Soft. Bowel sounds are decreased, He exhibits no distension. There is no tenderness.  Ext: right wrist still tender and left hand tenderness Neurological: He is alert and oriented to person, place, and time.  Skin: Skin is warm and dry. No rash noted. No erythema.  Psychiatric: He has a normal mood and affect. His behavior is normal.     Lab Results Recent Labs    05/01/20 0733 05/02/20 0127  WBC 20.2* 18.5*  HGB 9.9* 9.2*  HCT 29.2* 27.1*  NA 128* 128*  K 4.2 4.7  CL 97* 97*  CO2 23 21*  BUN 70* 77*  CREATININE 1.38* 1.21   Liver Panel Recent Labs    05/01/20 0733 05/02/20 0127  PROT 5.7* 5.9*  ALBUMIN 1.6* 1.5*  AST 37 40  ALT 29 28  ALKPHOS 104 92  BILITOT 1.0 0.6    Microbiology: 7/10 blood cx NGTD at 48hrs Studies/Results: CT WRIST RIGHT W CONTRAST  Result Date: 05/01/2020 CLINICAL DATA:  Wrist pain and swelling. EXAM: CT OF THE UPPER RIGHT EXTREMITY WITH CONTRAST TECHNIQUE: Multidetector CT imaging of the upper right extremity was performed according to the standard protocol following intravenous contrast administration. COMPARISON:  Radiographs same date. CONTRAST:  OMNIPAQUE IOHEXOL 300 MG/ML  SOLN FINDINGS: The bony structures are intact. No fractures or destructive bony changes. The joint spaces are maintained. Focal soft tissue  thickening and suspected subcutaneous abscess wrapping around the dorsal and ulnar aspect of the distal ulna. This measures a maximum of 2.7 cm. I do not see any definite findings for septic arthritis or osteomyelitis. Diffuse subcutaneous soft tissue swelling/edema suggesting cellulitis. IMPRESSION: 1. Focal soft tissue thickening and suspected subcutaneous abscess wrapping around the dorsal and ulnar aspect of the distal ulna. 2. Diffuse subcutaneous soft tissue swelling/edema suggesting cellulitis. 3. No definite CT findings for septic arthritis or osteomyelitis. Electronically  Signed   By: Rudie Meyer M.D.   On: 05/01/2020 13:42   CT HAND LEFT W CONTRAST  Result Date: 05/01/2020 CLINICAL DATA:  Pain and swelling. EXAM: CT OF THE UPPER LEFT EXTREMITY WITH CONTRAST TECHNIQUE: Multidetector CT imaging of the upper left extremity was performed according to the standard protocol following intravenous contrast administration. CONTRAST:  OMNIPAQUE IOHEXOL 300 MG/ML  SOLN COMPARISON:  Radiographs, same date. FINDINGS: Marked soft tissue thickening noted over the dorsum of the hand overlying mainly the second and third MCP joints. There is a rim enhancing abscess noted overlying the second MCP joint which measures a maximum of 2.8 cm. There is also diffuse cellulitis and suspected myositis. I do not see any obvious destructive bony changes to suggest septic arthritis or osteomyelitis but MRI would be much more sensitive for these 2 entities. IMPRESSION: 1. 2.8 cm rim enhancing abscess overlying the second MCP joint. 2. Diffuse cellulitis and suspected myositis. 3. No obvious destructive bony changes to suggest septic arthritis or osteomyelitis but MRI would be much more sensitive for these 2 entities. Electronically Signed   By: Rudie Meyer M.D.   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 May need to have him be seen by CT surgery to see if candidate for angiovac of TV vegetation. Would like to see his TEE results first to see extent of valvular involvement  Leukocytosis = still elevated due to source control. Agree with I x D. If still elevated after  I x D of hands by dr Eulah Pont, may need to consider imaging of spine to see other possible sources of infection  Suicide ideation = being seen by psychiatry  Chronic hep  c = low level viremia. No need for treatment at this time.    [facility] for Infectious Diseases Cell: [phone] Pager: [phone]  05/02/2020, 2:45 PM

## 2020-05-02 NOTE — Consult Note (Signed)
[facility] Face-to-Face Psychiatry Consult   Reason for Consult:  "homicidal ideation; threatening staff" Referring Physician:  Dr. Frederick Peers Patient Identification: Clifford Clark MRN:  [id number] Principal Diagnosis: Endocarditis Diagnosis:  Principal Problem:   Endocarditis Active Problems:   IVDU (intravenous drug user)   Sepsis (HCC)   Thrombocytopenia (HCC)   Hypokalemia   Hyponatremia   MRSA bacteremia   Malnutrition of moderate degree   Threatening behavior   Hand abscess   Elevated BUN   Total Time spent with patient: 20 minutes  Subjective:  "I had relapsed on heroin and I couldn't even get up."   HPI: Clifford Clark is a 29 y.o. male patient.  Patient assessed by nurse practitioner.  Patient somnolent upon my arrival, awakens easily.  Patient alert and oriented, participates in assessment.  Patient requires awakening 2 times during assessment.  Patient denies suicidal ideations.  Patient denies history of suicide attempts. Patient denies homicidal ideations.  Patient denies history of violent and aggressive behavior toward others.  Questioned patient regarding  Reports of threatening staff, patient unable to recall threatening staff. Patient denies auditory visual hallucinations.  Patient denies symptoms of paranoia. Patient reports "years of drug abuse."  Patient reports treated with Suboxone until approximately 3 years ago when he relapsed on heroin.  Patient reports daily use of heroin x3 years.  Patient reports readiness to pursue substance use treatment once medically clear.  Patient reports recently completed detox at [facility] in [place] approximately 2 months ago.  Patient currently interested in pursuing substance use treatment. Patient reports he resides with his mother and stepfather.  Patient denies access to weapons.  Patient reports he is currently unemployed but plans to look for a job. Patient offered support and encouragement.    Past Psychiatric History:  Substance use disorder, threatening behavior  Risk to Self:   Denies Risk to Others:   Denies Prior Inpatient Therapy:   Inpatient substance use treatment, Last at [facility]- 2 months ago Prior Outpatient Therapy:    Past Medical History:  Past Medical History:  Diagnosis Date  . Asthma   . Opiate abuse, continuous (HCC)    Heroin   History reviewed. No pertinent surgical history. Family History: No family history on file. Family Psychiatric  History: None reported Social History:  Social History   Substance and Sexual Activity  Alcohol Use Not Currently   Comment: rarely     Social History   Substance and Sexual Activity  Drug Use Not Currently  . Types: Marijuana, IV, "Crack" cocaine, Cocaine, Fentanyl, Methamphetamines, Amphetamines   Comment: Heroin    Social History   Socioeconomic History  . Marital status: Single    Spouse name: Not on file  . Number of children: Not on file  . Years of education: Not on file  . Highest education level: Not on file  Occupational History  . Not on file  Tobacco Use  . Smoking status: Former Smoker    Packs/day: 0.50  . Smokeless tobacco: Never Used  . Tobacco comment: using nicotine patches  Vaping Use  . Vaping Use: Never used  Substance and Sexual Activity  . Alcohol use: Not Currently    Comment: rarely  . Drug use: Not Currently    Types: Marijuana, IV, "Crack" cocaine, Cocaine, Fentanyl, Methamphetamines, Amphetamines    Comment: Heroin  . Sexual activity: Yes  Other Topics Concern  . Not on file  Social History Narrative  . Not on file   Social Determinants of Health  Financial Resource Strain:   . Difficulty of Paying Living Expenses:   Food Insecurity:   . Worried About Programme researcher, broadcasting/film/video in the Last Year:   . Barista in the Last Year:   Transportation Needs:   . Freight forwarder (Medical):   Marland Kitchen Lack of Transportation (Non-Medical):   Physical Activity:   . Days of Exercise per  Week:   . Minutes of Exercise per Session:   Stress:   . Feeling of Stress :   Social Connections:   . Frequency of Communication with Friends and Family:   . Frequency of Social Gatherings with Friends and Family:   . Attends Religious Services:   . Active Member of Clubs or Organizations:   . Attends Banker Meetings:   Marland Kitchen Marital Status:    Additional Social History:    Allergies:  No Known Allergies  Labs:  Results for orders placed or performed during the hospital encounter of 04/28/20 (from the past 48 hour(s))  CBC with Differential/Platelet     Status: Abnormal   Collection Time: 05/01/20  7:33 AM  Result Value Ref Range   WBC 20.2 (H) 4.0 - 10.5 K/uL   RBC 3.59 (L) 4.22 - 5.81 MIL/uL   Hemoglobin 9.9 (L) 13.0 - 17.0 g/dL   HCT 67.8 (L) 39 - 52 %   MCV 81.3 80.0 - 100.0 fL   MCH 27.6 26.0 - 34.0 pg   MCHC 33.9 30.0 - 36.0 g/dL   RDW 93.8 10.1 - 75.1 %   Platelets 112 (L) 150 - 400 K/uL    Comment: CONSISTENT WITH PREVIOUS RESULT Immature Platelet Fraction may be clinically indicated, consider ordering this additional test [id number]    nRBC 0.0 0.0 - 0.2 %   Neutrophils Relative % 80 %   Neutro Abs 16.1 (H) 1.7 - 7.7 K/uL   Lymphocytes Relative 9 %   Lymphs Abs 1.8 0.7 - 4.0 K/uL   Monocytes Relative 7 %   Monocytes Absolute 1.4 (H) 0 - 1 K/uL   Eosinophils Relative 0 %   Eosinophils Absolute 0.1 0 - 0 K/uL   Basophils Relative 0 %   Basophils Absolute 0.1 0 - 0 K/uL   Immature Granulocytes 4 %   Abs Immature Granulocytes 0.76 (H) 0.00 - 0.07 K/uL    Comment: Performed at [facility], [address], [place], [place] [address]  Comprehensive metabolic panel     Status: Abnormal   Collection Time: 05/01/20  7:33 AM  Result Value Ref Range   Sodium 128 (L) 135 - 145 mmol/L   Potassium 4.2 3.5 - 5.1 mmol/L   Chloride 97 (L) 98 - 111 mmol/L   CO2 23 22 - 32 mmol/L   Glucose, Bld 92 70 - 99 mg/dL    Comment: Glucose reference  range applies only to samples taken after fasting for at least 8 hours.   BUN 70 (H) 6 - 20 mg/dL   Creatinine, Ser 5.36 (H) 0.61 - 1.24 mg/dL   Calcium 7.3 (L) 8.9 - 10.3 mg/dL   Total Protein 5.7 (L) 6.5 - 8.1 g/dL   Albumin 1.6 (L) 3.5 - 5.0 g/dL   AST 37 15 - 41 U/L   ALT 29 0 - 44 U/L   Alkaline Phosphatase 104 38 - 126 U/L   Total Bilirubin 1.0 0.3 - 1.2 mg/dL   GFR calc non Af Amer >60 >60 mL/min   GFR calc Af Amer >  60 >60 mL/min   Anion gap 8 5 - 15    Comment: Performed at [facility], [address] [place], [place] [address]  Magnesium     Status: None   Collection Time: 05/01/20  7:33 AM  Result Value Ref Range   Magnesium 2.4 1.7 - 2.4 mg/dL    Comment: Performed at [facility], [address], [place], [place] [address]  Vancomycin, trough     Status: Abnormal   Collection Time: 05/01/20  9:55 AM  Result Value Ref Range   Vancomycin Tr 31 (HH) 15 - 20 ug/mL    Comment: CRITICAL RESULT CALLED TO, READ BACK BY AND VERIFIED WITH: IRSA HABIB [email or web address]  ON 7.11.21 BY TCALDWELL MT. Performed at [facility], [address] [place], [place] [address]   CBC with Differential/Platelet     Status: Abnormal   Collection Time: 05/02/20  1:27 AM  Result Value Ref Range   WBC 18.5 (H) 4.0 - 10.5 K/uL   RBC 3.31 (L) 4.22 - 5.81 MIL/uL   Hemoglobin 9.2 (L) 13.0 - 17.0 g/dL   HCT 16.0 (L) 39 - 52 %   MCV 81.9 80.0 - 100.0 fL   MCH 27.8 26.0 - 34.0 pg   MCHC 33.9 30.0 - 36.0 g/dL   RDW 73.7 10.6 - 26.9 %   Platelets 140 (L) 150 - 400 K/uL   nRBC 0.0 0.0 - 0.2 %   Neutrophils Relative % 80 %   Neutro Abs 14.6 (H) 1.7 - 7.7 K/uL   Lymphocytes Relative 9 %   Lymphs Abs 1.7 0.7 - 4.0 K/uL   Monocytes Relative 7 %   Monocytes Absolute 1.4 (H) 0 - 1 K/uL   Eosinophils Relative 0 %   Eosinophils Absolute 0.1 0 - 0 K/uL   Basophils Relative 0 %   Basophils Absolute 0.1 0 - 0 K/uL   Immature Granulocytes 4 %   Abs Immature  Granulocytes 0.68 (H) 0.00 - 0.07 K/uL    Comment: Performed at [facility], [address], [place], [place] [address]  Comprehensive metabolic panel     Status: Abnormal   Collection Time: 05/02/20  1:27 AM  Result Value Ref Range   Sodium 128 (L) 135 - 145 mmol/L   Potassium 4.7 3.5 - 5.1 mmol/L   Chloride 97 (L) 98 - 111 mmol/L   CO2 21 (L) 22 - 32 mmol/L   Glucose, Bld 137 (H) 70 - 99 mg/dL    Comment: Glucose reference range applies only to samples taken after fasting for at least 8 hours.   BUN 77 (H) 6 - 20 mg/dL   Creatinine, Ser 2.70 0.61 - 1.24 mg/dL   Calcium 7.3 (L) 8.9 - 10.3 mg/dL   Total Protein 5.9 (L) 6.5 - 8.1 g/dL   Albumin 1.5 (L) 3.5 - 5.0 g/dL   AST 40 15 - 41 U/L   ALT 28 0 - 44 U/L   Alkaline Phosphatase 92 38 - 126 U/L   Total Bilirubin 0.6 0.3 - 1.2 mg/dL   GFR calc non Af Amer >60 >60 mL/min   GFR calc Af Amer >60 >60 mL/min   Anion gap 10 5 - 15    Comment: Performed at [facility], [address] [place], [place] [address]  Magnesium     Status: Abnormal   Collection Time: 05/02/20  1:27 AM  Result Value Ref Range   Magnesium 2.6 (H)  1.7 - 2.4 mg/dL    Comment: Performed at [facility], [address] [place], [place] [id number]    Current Facility-Administered Medications  Medication Dose Route Frequency Provider Last Rate Last Admin  . acetaminophen (TYLENOL) tablet 650 mg  650 mg Oral Q6H PRN Therisa Doyneoutova, Anastassia, MD   650 mg at 04/29/20 2058   Or  . acetaminophen (TYLENOL) suppository 650 mg  650 mg Rectal Q6H PRN Doutova, Anastassia, MD      . Chlorhexidine Gluconate Cloth 2 % PADS 6 each  6 each Topical Q0600 Therisa Doyneoutova, Anastassia, MD   6 each at 05/02/20 1100  . docusate sodium (COLACE) capsule 100 mg  100 mg Oral BID Therisa Doyneoutova, Anastassia, MD   100 mg at 04/29/20 0835  . enoxaparin (LOVENOX) injection 40 mg  40 mg Subcutaneous Q24H Lewie ChamberGirguis, David, MD   40 mg at 05/02/20 0939  . feeding  supplement (BOOST / RESOURCE BREEZE) liquid 1 Container  1 Container Oral TID BM Lewie ChamberGirguis, David, MD   1 Container at 05/02/20 [phone]  . lactated ringers infusion   Intravenous Continuous Lewie ChamberGirguis, David, MD 125 mL/hr at 05/02/20 0841 Rate Verify at 05/02/20 0841  . LORazepam (ATIVAN) injection 0.5 mg  0.5 mg Intravenous Q4H PRN Lewie ChamberGirguis, David, MD   0.5 mg at 05/02/20 0804  . MEDLINE mouth rinse  15 mL Mouth Rinse BID Doutova, Anastassia, MD   15 mL at 05/02/20 0940  . mupirocin ointment (BACTROBAN) 2 % 1 application  1 application Nasal BID Therisa Doyneoutova, Anastassia, MD   1 application at 05/02/20 0939  . ondansetron (ZOFRAN) tablet 4 mg  4 mg Oral Q6H PRN Therisa Doyneoutova, Anastassia, MD       Or  . ondansetron (ZOFRAN) injection 4 mg  4 mg Intravenous Q6H PRN Doutova, Anastassia, MD      . oxyCODONE (Oxy IR/ROXICODONE) immediate release tablet 5 mg  5 mg Oral Q6H PRN Lewie ChamberGirguis, David, MD   5 mg at 05/01/20 1315  . sodium chloride flush (NS) 0.9 % injection 3 mL  3 mL Intravenous Q12H Therisa Doyneoutova, Anastassia, MD   3 mL at 05/02/20 0939  . vancomycin variable dose per unstable renal function (pharmacist dosing)   Does not apply See admin instructions Lewie ChamberGirguis, David, MD        Musculoskeletal: Strength & Muscle Tone: decreased Gait & Station: unable to assess Patient leans: N/A  Psychiatric Specialty Exam: Physical Exam Vitals and nursing note reviewed.  Constitutional:      Appearance: He is well-developed.  HENT:     Head: Normocephalic.  Cardiovascular:     Rate and Rhythm: Normal rate.  Pulmonary:     Effort: Pulmonary effort is normal.  Skin:    Coloration: Skin is pale.  Neurological:     Mental Status: He is alert and oriented to person, place, and time.     Motor: Weakness present.  Psychiatric:        Attention and Perception: Attention normal.        Mood and Affect: Mood and affect normal.        Speech: Speech normal.        Behavior: Behavior normal. Behavior is cooperative.        Thought  Content: Thought content normal.        Cognition and Memory: Cognition normal.     Review of Systems  HENT: Negative.   Cardiovascular: Negative.   Genitourinary: Negative.   Musculoskeletal: Negative.   Neurological: Negative.  Psychiatric/Behavioral: Positive for decreased concentration.    Blood pressure 121/75, pulse (!) 116, temperature 98 F (36.7 C), temperature source Oral, resp. rate (!) 28, height 5\' 7"  (1.702 m), weight 55 kg, SpO2 97 %.Body mass index is 18.99 kg/m.  General Appearance: Fairly Groomed  Eye Contact:  Fair  Speech:  soft voice, appears short of breath when speaking  Volume:  Decreased  Mood:  Euthymic  Affect:  Appropriate and Congruent  Thought Process:  Coherent, Goal Directed and Descriptions of Associations: Intact  Orientation:  Full (Time, Place, and Person)  Thought Content:  Logical  Suicidal Thoughts:  No  Homicidal Thoughts:  No  Memory:  Recent;   Fair  Judgement:  Fair  Insight:  Fair  Psychomotor Activity:  Normal  Concentration:  Concentration: Fair  Recall:  of Knowledge:  Fair  Language:  Fair  Akathisia:  No  Handed:  Right  AIMS (if indicated):     Assets:  Communication Skills Desire for Improvement Financial Resources/Insurance Housing Intimacy Leisure Time Resilience Social Support  ADL's:  Unable to assess  Cognition:  WNL  Sleep:        Treatment Plan Summary: Patient reviewed with Dr. [employer].  Patient is a 29 year old male, calm and cooperative during assessment.  Based on my assessment today, patient would benefit from follow-up with substance use treatment resources once medically cleared.  Recommendation: Consider social work consult to provide substance use treatment resources.  Please reconsult psychiatry as needed.  Disposition: No evidence of imminent risk to self or others at present.   Patient does not meet criteria for psychiatric inpatient admission. Supportive therapy  provided about ongoing stressors.  36, FNP 05/02/2020 12:54 PM

## 2020-05-02 NOTE — Consult Note (Signed)
Reason for Consult:Bilateral hand infections Referring Physician: D Girguis  Clifford Clark is an 29 y.o. male.  HPI: Clifford Clark was admitted 7/9 with IE 2/2 IVDU. He also has had bilateral hand and wrist pain that he says has been going on for about a week. He underwent workup that eventually included CT scans of both that showed abscesses and hand surgery was consulted. He is LHD and does not work.  Past Medical History:  Diagnosis Date   Asthma    Opiate abuse, continuous (HCC)    Heroin    History reviewed. No pertinent surgical history.  No family history on file.  Social History:  reports that he has quit smoking. He smoked 0.50 packs per day. He has never used smokeless tobacco. He reports previous alcohol use. He reports previous drug use. Drugs: Marijuana, IV, "Crack" cocaine, Cocaine, Fentanyl, Methamphetamines, and Amphetamines.  Allergies: No Known Allergies  Medications: I have reviewed the patient's current medications.  Results for orders placed or performed during the hospital encounter of 04/28/20 (from the past 48 hour(s))  CBC with Differential/Platelet     Status: Abnormal   Collection Time: 05/01/20  7:33 AM  Result Value Ref Range   WBC 20.2 (H) 4.0 - 10.5 K/uL   RBC 3.59 (L) 4.22 - 5.81 MIL/uL   Hemoglobin 9.9 (L) 13.0 - 17.0 g/dL   HCT 34.7 (L) 39 - 52 %   MCV 81.3 80.0 - 100.0 fL   MCH 27.6 26.0 - 34.0 pg   MCHC 33.9 30.0 - 36.0 g/dL   RDW 42.5 95.6 - 38.7 %   Platelets 112 (L) 150 - 400 K/uL    Comment: CONSISTENT WITH PREVIOUS RESULT Immature Platelet Fraction may be clinically indicated, consider ordering this additional test [id number]    nRBC 0.0 0.0 - 0.2 %   Neutrophils Relative % 80 %   Neutro Abs 16.1 (H) 1.7 - 7.7 K/uL   Lymphocytes Relative 9 %   Lymphs Abs 1.8 0.7 - 4.0 K/uL   Monocytes Relative 7 %   Monocytes Absolute 1.4 (H) 0 - 1 K/uL   Eosinophils Relative 0 %   Eosinophils Absolute 0.1 0 - 0 K/uL   Basophils Relative 0 %    Basophils Absolute 0.1 0 - 0 K/uL   Immature Granulocytes 4 %   Abs Immature Granulocytes 0.76 (H) 0.00 - 0.07 K/uL    Comment: Performed at [facility], [address], [place], [place] [address]  Comprehensive metabolic panel     Status: Abnormal   Collection Time: 05/01/20  7:33 AM  Result Value Ref Range   Sodium 128 (L) 135 - 145 mmol/L   Potassium 4.2 3.5 - 5.1 mmol/L   Chloride 97 (L) 98 - 111 mmol/L   CO2 23 22 - 32 mmol/L   Glucose, Bld 92 70 - 99 mg/dL    Comment: Glucose reference range applies only to samples taken after fasting for at least 8 hours.   BUN 70 (H) 6 - 20 mg/dL   Creatinine, Ser 6.60 (H) 0.61 - 1.24 mg/dL   Calcium 7.3 (L) 8.9 - 10.3 mg/dL   Total Protein 5.7 (L) 6.5 - 8.1 g/dL   Albumin 1.6 (L) 3.5 - 5.0 g/dL   AST 37 15 - 41 U/L   ALT 29 0 - 44 U/L   Alkaline Phosphatase 104 38 - 126 U/L   Total Bilirubin 1.0 0.3 - 1.2 mg/dL   GFR calc non Af Amer >60 >60  mL/min   GFR calc Af Amer >60 >60 mL/min   Anion gap 8 5 - 15    Comment: Performed at [facility], [address] [place], [place] [address]  Magnesium     Status: None   Collection Time: 05/01/20  7:33 AM  Result Value Ref Range   Magnesium 2.4 1.7 - 2.4 mg/dL    Comment: Performed at [facility], [address] [place], [place] [address]  Vancomycin, trough     Status: Abnormal   Collection Time: 05/01/20  9:55 AM  Result Value Ref Range   Vancomycin Tr 31 (HH) 15 - 20 ug/mL    Comment: CRITICAL RESULT CALLED TO, READ BACK BY AND VERIFIED WITH: IRSA HABIB [email or web address]  ON 7.11.21 BY TCALDWELL MT. Performed at [facility], [address], [place], [place] [address]   CBC with Differential/Platelet     Status: Abnormal   Collection Time: 05/02/20  1:27 AM  Result Value Ref Range   WBC 18.5 (H) 4.0 - 10.5 K/uL   RBC 3.31 (L) 4.22 - 5.81 MIL/uL   Hemoglobin 9.2 (L) 13.0 - 17.0 g/dL   HCT 76.5 (L) 39 - 52 %   MCV 81.9 80.0 - 100.0  fL   MCH 27.8 26.0 - 34.0 pg   MCHC 33.9 30.0 - 36.0 g/dL   RDW 46.5 03.5 - 46.5 %   Platelets 140 (L) 150 - 400 K/uL   nRBC 0.0 0.0 - 0.2 %   Neutrophils Relative % 80 %   Neutro Abs 14.6 (H) 1.7 - 7.7 K/uL   Lymphocytes Relative 9 %   Lymphs Abs 1.7 0.7 - 4.0 K/uL   Monocytes Relative 7 %   Monocytes Absolute 1.4 (H) 0 - 1 K/uL   Eosinophils Relative 0 %   Eosinophils Absolute 0.1 0 - 0 K/uL   Basophils Relative 0 %   Basophils Absolute 0.1 0 - 0 K/uL   Immature Granulocytes 4 %   Abs Immature Granulocytes 0.68 (H) 0.00 - 0.07 K/uL    Comment: Performed at [facility], [address], [place], [place] [address]  Comprehensive metabolic panel     Status: Abnormal   Collection Time: 05/02/20  1:27 AM  Result Value Ref Range   Sodium 128 (L) 135 - 145 mmol/L   Potassium 4.7 3.5 - 5.1 mmol/L   Chloride 97 (L) 98 - 111 mmol/L   CO2 21 (L) 22 - 32 mmol/L   Glucose, Bld 137 (H) 70 - 99 mg/dL    Comment: Glucose reference range applies only to samples taken after fasting for at least 8 hours.   BUN 77 (H) 6 - 20 mg/dL   Creatinine, Ser 5.17 0.61 - 1.24 mg/dL   Calcium 7.3 (L) 8.9 - 10.3 mg/dL   Total Protein 5.9 (L) 6.5 - 8.1 g/dL   Albumin 1.5 (L) 3.5 - 5.0 g/dL   AST 40 15 - 41 U/L   ALT 28 0 - 44 U/L   Alkaline Phosphatase 92 38 - 126 U/L   Total Bilirubin 0.6 0.3 - 1.2 mg/dL   GFR calc non Af Amer >60 >60 mL/min   GFR calc Af Amer >60 >60 mL/min   Anion gap 10 5 - 15    Comment: Performed at [facility], [address], [place], [place] [address]  Magnesium     Status: Abnormal   Collection Time: 05/02/20  1:27 AM  Result Value  Ref Range   Magnesium 2.6 (H) 1.7 - 2.4 mg/dL    Comment: Performed at [facility], [address] [place], [place] [address]    CT WRIST RIGHT W CONTRAST  Result Date: 05/01/2020 CLINICAL DATA:  Wrist pain and swelling. EXAM: CT OF THE UPPER RIGHT EXTREMITY WITH CONTRAST TECHNIQUE:  Multidetector CT imaging of the upper right extremity was performed according to the standard protocol following intravenous contrast administration. COMPARISON:  Radiographs same date. CONTRAST:  OMNIPAQUE IOHEXOL 300 MG/ML  SOLN FINDINGS: The bony structures are intact. No fractures or destructive bony changes. The joint spaces are maintained. Focal soft tissue thickening and suspected subcutaneous abscess wrapping around the dorsal and ulnar aspect of the distal ulna. This measures a maximum of 2.7 cm. I do not see any definite findings for septic arthritis or osteomyelitis. Diffuse subcutaneous soft tissue swelling/edema suggesting cellulitis. IMPRESSION: 1. Focal soft tissue thickening and suspected subcutaneous abscess wrapping around the dorsal and ulnar aspect of the distal ulna. 2. Diffuse subcutaneous soft tissue swelling/edema suggesting cellulitis. 3. No definite CT findings for septic arthritis or osteomyelitis. Electronically Signed   By: Rudie Meyer M.D.   On: 05/01/2020 13:42   CT HAND LEFT W CONTRAST  Result Date: 05/01/2020 CLINICAL DATA:  Pain and swelling. EXAM: CT OF THE UPPER LEFT EXTREMITY WITH CONTRAST TECHNIQUE: Multidetector CT imaging of the upper left extremity was performed according to the standard protocol following intravenous contrast administration. CONTRAST:  OMNIPAQUE IOHEXOL 300 MG/ML  SOLN COMPARISON:  Radiographs, same date. FINDINGS: Marked soft tissue thickening noted over the dorsum of the hand overlying mainly the second and third MCP joints. There is a rim enhancing abscess noted overlying the second MCP joint which measures a maximum of 2.8 cm. There is also diffuse cellulitis and suspected myositis. I do not see any obvious destructive bony changes to suggest septic arthritis or osteomyelitis but MRI would be much more sensitive for these 2 entities. IMPRESSION: 1. 2.8 cm rim enhancing abscess overlying the second MCP joint. 2. Diffuse cellulitis and  suspected myositis. 3. No obvious destructive bony changes to suggest septic arthritis or osteomyelitis but MRI would be much more sensitive for these 2 entities. Electronically Signed   By: Rudie Meyer M.D.   On: 05/01/2020 13:49    Review of Systems  Constitutional: Positive for chills, diaphoresis and fever.  HENT: Negative for ear discharge, ear pain, hearing loss and tinnitus.   Eyes: Negative for photophobia and pain.  Respiratory: Negative for cough and shortness of breath.   Cardiovascular: Negative for chest pain.  Gastrointestinal: Negative for abdominal pain, nausea and vomiting.  Genitourinary: Negative for dysuria, flank pain, frequency and urgency.  Musculoskeletal: Positive for arthralgias (Bilateral hands/wrists). Negative for back pain, myalgias and neck pain.  Neurological: Negative for dizziness and headaches.  Hematological: Does not bruise/bleed easily.  Psychiatric/Behavioral: The patient is not nervous/anxious.    Blood pressure 113/75, pulse (!) 115, temperature 98 F (36.7 C), temperature source Oral, resp. rate (!) 49, height 5\' 7"  (1.702 m), weight 55 kg, SpO2 97 %. Physical Exam Constitutional:      General: He is not in acute distress.    Appearance: He is well-developed. He is not diaphoretic.  HENT:     Head: Normocephalic and atraumatic.  Eyes:     General: No scleral icterus.       Right eye: No discharge.        Left eye: No discharge.  Conjunctiva/sclera: Conjunctivae normal.  Cardiovascular:     Rate and Rhythm: Normal rate and regular rhythm.  Pulmonary:     Effort: Pulmonary effort is normal. No respiratory distress.  Musculoskeletal:     Cervical back: Normal range of motion.     Comments: Right shoulder, elbow, wrist, digits- no skin wounds, TTP ulnar wrist, no instability, no blocks to motion  Sens  Ax/R/M/U intact  Mot   Ax/ R/ PIN/ M/ AIN/ U intact  Rad 2+  Left shoulder, elbow, wrist, digits- no skin wounds, diffuse TTP,  discoloration over 2nd MCP, no instability, no blocks to motion  Sens  Ax/R/M/U intact  Mot   Ax/ R/ PIN/ M/ AIN/ intact, ?U weak  Rad 2+  Skin:    General: Skin is warm and dry.  Neurological:     Mental Status: He is alert.  Psychiatric:        Behavior: Behavior normal.     Assessment/Plan: Right wrist and left hand abscesses -- Plan I&D tomorrow by Dr. Eulah PontMurphy. Please keep NPO after MN. Multiple medical problems including IVDU, IE, sepsis, thrombocytopenia, and hyponatremia -- per primary service    Freeman CaldronMichael J. Jordon Bourquin, PA-C Orthopedic Surgery [phone] 05/02/2020, 10:11 AM

## 2020-05-03 ENCOUNTER — Inpatient Hospital Stay (HOSPITAL_COMMUNITY): Payer: Self-pay | Admitting: Certified Registered"

## 2020-05-03 ENCOUNTER — Encounter (HOSPITAL_COMMUNITY): Payer: Self-pay

## 2020-05-03 ENCOUNTER — Encounter (HOSPITAL_COMMUNITY)
Admission: EM | Disposition: A | Discharge status: Home / Self Care | Payer: Self-pay | Source: Home / Self Care | Attending: Internal Medicine

## 2020-05-03 ENCOUNTER — Inpatient Hospital Stay (HOSPITAL_COMMUNITY): Payer: Self-pay

## 2020-05-03 HISTORY — PX: I & D EXTREMITY: SHX5045

## 2020-05-03 LAB — CULTURE, BLOOD (ROUTINE X 2)
Special Requests: ADEQUATE
Special Requests: ADEQUATE

## 2020-05-03 LAB — CBC WITH DIFFERENTIAL/PLATELET
Abs Immature Granulocytes: 0.54 10*3/uL — ABNORMAL HIGH (ref 0.00–0.07)
Basophils Absolute: 0 10*3/uL (ref 0.0–0.1)
Basophils Relative: 0 %
Eosinophils Absolute: 0.1 10*3/uL (ref 0.0–0.5)
Eosinophils Relative: 1 %
HCT: 23.8 % — ABNORMAL LOW (ref 39.0–52.0)
Hemoglobin: 8 g/dL — ABNORMAL LOW (ref 13.0–17.0)
Immature Granulocytes: 4 %
Lymphocytes Relative: 11 %
Lymphs Abs: 1.5 10*3/uL (ref 0.7–4.0)
MCH: 27.6 pg (ref 26.0–34.0)
MCHC: 33.6 g/dL (ref 30.0–36.0)
MCV: 82.1 fL (ref 80.0–100.0)
Monocytes Absolute: 1.1 10*3/uL — ABNORMAL HIGH (ref 0.1–1.0)
Monocytes Relative: 8 %
Neutro Abs: 10.6 10*3/uL — ABNORMAL HIGH (ref 1.7–7.7)
Neutrophils Relative %: 76 %
Platelets: 171 10*3/uL (ref 150–400)
RBC: 2.9 MIL/uL — ABNORMAL LOW (ref 4.22–5.81)
RDW: 15.1 % (ref 11.5–15.5)
WBC: 13.9 10*3/uL — ABNORMAL HIGH (ref 4.0–10.5)
nRBC: 0 % (ref 0.0–0.2)

## 2020-05-03 LAB — COMPREHENSIVE METABOLIC PANEL
ALT: 41 U/L (ref 0–44)
AST: 58 U/L — ABNORMAL HIGH (ref 15–41)
Albumin: 1.4 g/dL — ABNORMAL LOW (ref 3.5–5.0)
Alkaline Phosphatase: 81 U/L (ref 38–126)
Anion gap: 6 (ref 5–15)
BUN: 57 mg/dL — ABNORMAL HIGH (ref 6–20)
CO2: 23 mmol/L (ref 22–32)
Calcium: 7 mg/dL — ABNORMAL LOW (ref 8.9–10.3)
Chloride: 102 mmol/L (ref 98–111)
Creatinine, Ser: 1 mg/dL (ref 0.61–1.24)
GFR calc Af Amer: >60 mL/min (ref >60)
GFR calc non Af Amer: >60 mL/min (ref >60)
Glucose, Bld: 106 mg/dL — ABNORMAL HIGH (ref 70–99)
Potassium: 4.3 mmol/L (ref 3.5–5.1)
Sodium: 131 mmol/L — ABNORMAL LOW (ref 135–145)
Total Bilirubin: 0.5 mg/dL (ref 0.3–1.2)
Total Protein: 5.3 g/dL — ABNORMAL LOW (ref 6.5–8.1)

## 2020-05-03 LAB — MAGNESIUM: Magnesium: 2.3 mg/dL (ref 1.7–2.4)

## 2020-05-03 SURGERY — IRRIGATION AND DEBRIDEMENT EXTREMITY
Anesthesia: General | Laterality: Bilateral

## 2020-05-03 MED ORDER — LACTATED RINGERS IV SOLN: INTRAVENOUS | Status: DC

## 2020-05-03 MED ORDER — OXYCODONE HCL 5 MG PO TABS
5.0000 mg | ORAL_TABLET | Freq: Four times a day (QID) | ORAL | Status: DC | PRN
  Administered 2020-05-03 – 2020-05-08 (×16): 10 mg via ORAL
  Filled 2020-05-03 (×16): qty 2

## 2020-05-03 MED ORDER — FENTANYL CITRATE (PF) 100 MCG/2ML IJ SOLN
INTRAMUSCULAR | Status: AC
  Filled 2020-05-03: qty 2

## 2020-05-03 MED ORDER — KETAMINE HCL 10 MG/ML IJ SOLN
INTRAMUSCULAR | Status: DC | PRN
Start: 2020-05-03 — End: 2020-05-03
  Administered 2020-05-03 (×3): 10 mg via INTRAVENOUS

## 2020-05-03 MED ORDER — OXYCODONE HCL 5 MG PO TABS: 5.0000 mg | ORAL_TABLET | Freq: Once | ORAL | Status: DC | PRN

## 2020-05-03 MED ORDER — PHENYLEPHRINE 40 MCG/ML (10ML) SYRINGE FOR IV PUSH (FOR BLOOD PRESSURE SUPPORT)
PREFILLED_SYRINGE | INTRAVENOUS | Status: DC | PRN
  Administered 2020-05-03: 40 ug via INTRAVENOUS

## 2020-05-03 MED ORDER — PROPOFOL 10 MG/ML IV BOLUS
INTRAVENOUS | Status: DC | PRN
  Administered 2020-05-03: 150 mg via INTRAVENOUS

## 2020-05-03 MED ORDER — PROPOFOL 10 MG/ML IV BOLUS
INTRAVENOUS | Status: AC
  Filled 2020-05-03: qty 20

## 2020-05-03 MED ORDER — KETAMINE HCL 10 MG/ML IJ SOLN
INTRAMUSCULAR | Status: AC
  Filled 2020-05-03: qty 1

## 2020-05-03 MED ORDER — DEXAMETHASONE SODIUM PHOSPHATE 10 MG/ML IJ SOLN
INTRAMUSCULAR | Status: AC
  Filled 2020-05-03: qty 1

## 2020-05-03 MED ORDER — MIDAZOLAM HCL 2 MG/2ML IJ SOLN
INTRAMUSCULAR | Status: AC
  Filled 2020-05-03: qty 2

## 2020-05-03 MED ORDER — MEPERIDINE HCL 50 MG/ML IJ SOLN: 6.2500 mg | INTRAMUSCULAR | Status: DC | PRN

## 2020-05-03 MED ORDER — VANCOMYCIN HCL 1000 MG IV SOLR
INTRAVENOUS | Status: AC
  Filled 2020-05-03: qty 1000

## 2020-05-03 MED ORDER — OXYCODONE HCL 5 MG/5ML PO SOLN: 5.0000 mg | Freq: Once | ORAL | Status: DC | PRN

## 2020-05-03 MED ORDER — MORPHINE SULFATE (PF) 4 MG/ML IV SOLN
4.0000 mg | INTRAVENOUS | Status: DC | PRN
  Administered 2020-05-03 (×2): 2 mg via INTRAVENOUS
  Administered 2020-05-04 – 2020-05-13 (×47): 4 mg via INTRAVENOUS
  Filled 2020-05-03 (×48): qty 1

## 2020-05-03 MED ORDER — ONDANSETRON HCL 4 MG/2ML IJ SOLN
INTRAMUSCULAR | Status: DC | PRN
  Administered 2020-05-03: 4 mg via INTRAVENOUS

## 2020-05-03 MED ORDER — FENTANYL CITRATE (PF) 100 MCG/2ML IJ SOLN
INTRAMUSCULAR | Status: DC | PRN
  Administered 2020-05-03: 100 ug via INTRAVENOUS

## 2020-05-03 MED ORDER — ENSURE PRE-SURGERY PO LIQD
296.0000 mL | Freq: Once | ORAL | Status: DC
  Filled 2020-05-03: qty 296

## 2020-05-03 MED ORDER — CALCIUM CARBONATE-VITAMIN D 500-200 MG-UNIT PO TABS
2.0000 | ORAL_TABLET | Freq: Two times a day (BID) | ORAL | Status: AC
  Administered 2020-05-03 – 2020-05-04 (×4): 2 via ORAL
  Filled 2020-05-03 (×4): qty 2

## 2020-05-03 MED ORDER — VANCOMYCIN HCL 1000 MG IV SOLR
INTRAVENOUS | Status: DC | PRN
  Administered 2020-05-03: 1000 mg via TOPICAL

## 2020-05-03 MED ORDER — ACETAMINOPHEN 160 MG/5ML PO SOLN: 325.0000 mg | ORAL | Status: DC | PRN

## 2020-05-03 MED ORDER — 0.9 % SODIUM CHLORIDE (POUR BTL) OPTIME
TOPICAL | Status: DC | PRN
  Administered 2020-05-03: 2000 mL

## 2020-05-03 MED ORDER — LIDOCAINE 2% (20 MG/ML) 5 ML SYRINGE
INTRAMUSCULAR | Status: AC
  Filled 2020-05-03: qty 5

## 2020-05-03 MED ORDER — CEFAZOLIN SODIUM-DEXTROSE 2-3 GM-%(50ML) IV SOLR
INTRAVENOUS | Status: DC | PRN
  Administered 2020-05-03: 2 g via INTRAVENOUS

## 2020-05-03 MED ORDER — MIDAZOLAM HCL 2 MG/2ML IJ SOLN
INTRAMUSCULAR | Status: DC | PRN
  Administered 2020-05-03: 1 mg via INTRAVENOUS

## 2020-05-03 MED ORDER — ACETAMINOPHEN 325 MG PO TABS: 325.0000 mg | ORAL_TABLET | ORAL | Status: DC | PRN

## 2020-05-03 MED ORDER — ONDANSETRON HCL 4 MG/2ML IJ SOLN
INTRAMUSCULAR | Status: AC
  Filled 2020-05-03: qty 2

## 2020-05-03 MED ORDER — LIDOCAINE 2% (20 MG/ML) 5 ML SYRINGE
INTRAMUSCULAR | Status: DC | PRN
  Administered 2020-05-03: 100 mg via INTRAVENOUS

## 2020-05-03 MED ORDER — DEXAMETHASONE SODIUM PHOSPHATE 10 MG/ML IJ SOLN
INTRAMUSCULAR | Status: DC | PRN
  Administered 2020-05-03: 10 mg via INTRAVENOUS

## 2020-05-03 MED ORDER — CHLORHEXIDINE GLUCONATE 4 % EX LIQD
60.0000 mL | Freq: Once | CUTANEOUS | Status: DC
  Filled 2020-05-03: qty 60

## 2020-05-03 MED ORDER — FENTANYL CITRATE (PF) 100 MCG/2ML IJ SOLN
INTRAMUSCULAR | Status: AC
  Filled 2020-05-03: qty 4

## 2020-05-03 MED ORDER — CEFAZOLIN SODIUM-DEXTROSE 2-4 GM/100ML-% IV SOLN
INTRAVENOUS | Status: AC
  Filled 2020-05-03: qty 100

## 2020-05-03 MED ORDER — FENTANYL CITRATE (PF) 100 MCG/2ML IJ SOLN
25.0000 ug | INTRAMUSCULAR | Status: DC | PRN
  Administered 2020-05-03 (×2): 50 ug via INTRAVENOUS

## 2020-05-03 MED ORDER — ONDANSETRON HCL 4 MG/2ML IJ SOLN: 4.0000 mg | Freq: Once | INTRAMUSCULAR | Status: DC | PRN

## 2020-05-03 MED ORDER — POVIDONE-IODINE 10 % EX SWAB: 2.0000 "application " | Freq: Once | CUTANEOUS | Status: DC

## 2020-05-03 SURGICAL SUPPLY — 32 items
BLADE HEX COATED 2.75 (ELECTRODE) ×2 IMPLANT
BNDG COHESIVE 4X5 TAN STRL (GAUZE/BANDAGES/DRESSINGS) ×2 IMPLANT
BNDG COHESIVE 6X5 TAN STRL LF (GAUZE/BANDAGES/DRESSINGS) ×4 IMPLANT
BNDG ELASTIC 6X5.8 VLCR STR LF (GAUZE/BANDAGES/DRESSINGS) ×2 IMPLANT
COTTON STERILE ROLL (GAUZE/BANDAGES/DRESSINGS) ×2 IMPLANT
COVER SURGICAL LIGHT HANDLE (MISCELLANEOUS) ×2 IMPLANT
COVER WAND RF STERILE (DRAPES) IMPLANT
DRAPE U-SHAPE 47X51 STRL (DRAPES) ×2 IMPLANT
DRSG ADAPTIC 3X8 NADH LF (GAUZE/BANDAGES/DRESSINGS) ×2 IMPLANT
DRSG EMULSION OIL 3X16 NADH (GAUZE/BANDAGES/DRESSINGS) ×2 IMPLANT
DURAPREP 26ML APPLICATOR (WOUND CARE) ×2 IMPLANT
ELECT REM PT RETURN 15FT ADLT (MISCELLANEOUS) ×2 IMPLANT
GAUZE SPONGE 4X4 12PLY STRL (GAUZE/BANDAGES/DRESSINGS) ×3 IMPLANT
GLOVE BIOGEL PI IND STRL 8.5 (GLOVE) ×1 IMPLANT
GLOVE BIOGEL PI INDICATOR 8.5 (GLOVE) ×1
GLOVE SURG ORTHO 9.0 STRL STRW (GLOVE) ×2 IMPLANT
HANDPIECE INTERPULSE COAX TIP (DISPOSABLE) ×1
KIT BASIN (CUSTOM PROCEDURE TRAY) ×2 IMPLANT
KIT TURNOVER KIT A (KITS) IMPLANT
MANIFOLD NEPTUNE II (INSTRUMENTS) ×2 IMPLANT
NS IRRIG 1000ML POUR BTL (IV SOLUTION) ×2 IMPLANT
PACK ORTHO EXTREMITY (CUSTOM PROCEDURE TRAY) ×2 IMPLANT
PADDING CAST COTTON 6X4 STRL (CAST SUPPLIES) ×2 IMPLANT
PENCIL SMOKE EVACUATOR (MISCELLANEOUS) IMPLANT
PROTECTOR NERVE ULNAR (MISCELLANEOUS) ×2 IMPLANT
SET HNDPC FAN SPRY TIP SCT (DISPOSABLE) ×1 IMPLANT
SPONGE LAP 18X18 RF (DISPOSABLE) ×2 IMPLANT
STOCKINETTE 8 INCH (MISCELLANEOUS) ×2 IMPLANT
SWAB COLLECTION DEVICE MRSA (MISCELLANEOUS) ×2 IMPLANT
SWAB CULTURE ESWAB REG 1ML (MISCELLANEOUS) ×2 IMPLANT
UNDERPAD 30X36 HEAVY ABSORB (UNDERPADS AND DIAPERS) ×2 IMPLANT
WATER STERILE IRR 1000ML POUR (IV SOLUTION) ×2 IMPLANT

## 2020-05-03 NOTE — Progress Notes (Signed)
MD made aware of uncontrolled pain post-op. Will look for new orders to treat.

## 2020-05-03 NOTE — Op Note (Signed)
05/03/2020  10:10 AM  PATIENT:  Clifford Clark    PRE-OPERATIVE DIAGNOSIS:  bilateral hand infection  POST-OPERATIVE DIAGNOSIS:  Same  PROCEDURE:  BILATERAL IRRIGATION AND DEBRIDEMENT HANDS  SURGEON:  [name], MD  ASSISTANT: Skip Mayer, PA-C, he was present and scrubbed throughout the case, critical for completion in a timely fashion, and for retraction, instrumentation, and closure.   ANESTHESIA:   gen  PREOPERATIVE INDICATIONS:  Clifford Clark is a  29 y.o. male with a diagnosis of bilateral hand infection who failed conservative measures and elected for surgical management.    The risks benefits and alternatives were discussed with the patient preoperatively including but not limited to the risks of infection, bleeding, nerve injury, cardiopulmonary complications, the need for revision surgery, among others, and the patient was willing to proceed.  OPERATIVE IMPLANTS: none  OPERATIVE FINDINGS: purulent fluid at L 2nd MCP and 5th PIP subq, also at R distal ulna  BLOOD LOSS: min  COMPLICATIONS: none  TOURNIQUET TIME: none  OPERATIVE PROCEDURE:  Patient was identified in the preoperative holding area and site was marked by me He was transported to the operating theater and placed on the table in supine position taking care to pad all bony prominences. After a preincinduction time out anesthesia was induced. The bilateral upper extremity was prepped and draped in normal sterile fashion and a pre-incision timeout was performed. He received ancef and scheduled vanc for preoperative antibiotics.   I just discussed with the patient at preop his left upper extremity he had noted fluctuance and fullness at his small finger just proximal to his PIP joint and tenderness here with some redness.  I discussed the risks of I&D of this area as well and he wishes to have that done today also.  I prepped and draped both upper extremities started on the right distal ulna.  Longitudinal  incision was made purulent and necrotic tissue was expressed from this sent this for culture.  I debrided all devitalized tissue down to fascia with a rondure and pickup this was an excisional debridement  I then thoroughly irrigated.  I performed a tenolysis of ECU and FCU tendons.  After the thorough irrigation closure was performed sterile dressing applied.  Turned my attention of the left upper extremity I made an incision over his hand just proximal to his MCP joint at the second digit.  Purulent fluid was expressed here as well as necrotic tissue this was collected and sent for culture  I performed an excisional debridement using pickups and scissors down to the level of fascia  I performed a tenolysis of extensor tendons to the index and long finger.  I then closed this incision.  Next I made a small incision just proximal to the PIP joint of the small finger on the radial aspect I elevated here and there was some purulent fluid outside of the joint but just under the subcu space I performed an excisional debridement here with pickups to the level of the fascia I irrigated this this was also closed sterile dressing was placed on this hand.  I performed a tenolysis of his extensor tendon  Again sterile dressing was applied he was awoken taken to PACU in stable condition  POST OPERATIVE PLAN: dvt px per primary team

## 2020-05-03 NOTE — Progress Notes (Deleted)
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 homeless, asthma who presented to the ER after being found on the ground sleeping by a friend he stated.  EMS was called and he was transported to the hospital. In the ER with multiple imaging studies including x-ray of his right hand and wrist, x-ray of left hand, CXR, CT chest/abdomen/pelvis, CT C-spine, and CT head.  There were no acute fractures in his bilateral upper extremities.  CT head also unremarkable.  Remainder of his imaging studies revealed what appeared to be septic pulmonary emboli as well as septic emboli involving both kidneys. Cultures were drawn and he was started on vancomycin, cefepime, and Flagyl in the ER - 4/4 blood cultures were growing MRSA. TTE also performed on 7/9 revealing TV vegetation. Became verbally abusive/threatening 05/01/20 and Psych was consulted and a sitter was requested.   7/12 Patient developed worsening swelling in his right wrist and left hand. There was concern for septic joint or bursitis. A CT was ordered bilaterally which did show bilateral abscesses.  7/13 Bilateral I&D complaining of pain - admits to taking Carfentanyl at home/off the street.    Interval History/ROS:  No acute issues or events overnight, patient complaining of postoperative pain controlled this morning stating "it hurts all over but mostly in my wrists".  He otherwise denies nausea, vomiting, diarrhea, headache, fevers, chills.  Reports some constipation, has not had bowel movement in a "sometime" but no definitive timeline.     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 - TTE performed on 04/29/20: TV vegetation (62mm) noted as well as possible veg on PV -ID following,  recommending TEE with possible need for angio vac pending findings - will need minimum 6 weeks IV vanc - trend BC until clear (7/10 cultures remain NGTD) prior to PICC placement; patient will need to remain in hospital for duration of treatment course; no indication for IVC if tries to leave AMA - other complaint to keep note of is ongoing back pain (diffuse, even paraspinal) so suspicion for discitis/OM is lower currently as pain is diffuse, however if does not improve may need to be further considered, especially if blood cultures are difficult to clear  - he is high risk for leaving AMA and for worsening infection and/or death if he does; called and spoke with his mom Clifford Clark on 04/29/20 and updated her as well as told her all this as well  Hand abscess - Likely secondary to above - bilateral abscesses on hands, s/p CT on 7/11 - appreciate hand surgery consult; I&D on 7/13  Sepsis (HCC) secondary to above - see MRSA bacteremia  Acute metabolic encephalopathy, resolved - considered due to IV drugs as well as sepsis/infection - mentation has cleared and diet started   Thrombocytopenia (HCC) - likely due to acute illness/sepsis; cannot rule out other etiologies yet - trend CBC with infection treatment; slowly improving   Hyponatremia - replete and recheck as needed  Elevated LFTs - possibly due to current infection/sepsis vs risk for hepatitis from IV drug use - hepatitis panel noted for hep C Ab positive - check HCV Quant VL: not elevated - LFTs improving with infection treatment  Hypokalemia -Replete and recheck as needed  Malnutrition of moderate degree Per RD: "Moderate Malnutrition related to social / environmental circumstances as evidenced by energy intake < 75% for > or equal to 3 months, moderate fat depletion, moderate muscle depletion, percent weight loss." - appreciate assistance - diet advanced on 7/10  Threatening behavior - on 7/11, patient endorsed multiple  homicidal ideations to nurse and CNA in room; he threatened that he was going "to kill the attending" and all their family and kids; this was voiced several times despite nursing attempting to re-direct patient - will request psych eval at this time; also have ordered sitter - he already has oxycodone for pain and opioid w/d as well as ativan for anxiety/agitation  AKI - BUN has been uptrending; creatinine stable - restart on IVF on 7/12 and monitor BUN response Lab Results  Component Value Date   CREATININE 1.00 05/03/2020   CREATININE 1.21 05/02/2020   CREATININE 1.38 (H) 05/01/2020     Antimicrobials: Vanc 04/28/20 >> present Cefepime 04/28/20 - 04/29/20 Flagyl 04/28/20 - 04/29/20  DVT prophylaxis: Lovenox Code Status: Full Family Communication: Mother Disposition Plan:  Status is: Inpatient  Remains inpatient appropriate because:Ongoing active pain requiring inpatient pain management, Unsafe d/c plan, IV treatments appropriate due to intensity of illness or inability to take PO and Inpatient level of care appropriate due to severity of illness   Dispo: The patient is from: Home              Anticipated d/c is to: Home              Anticipated d/c date is: > 3 days              Patient currently is not medically stable to d/c.  Objective: Blood pressure 127/75, pulse (!) 109, temperature (!) 97.4 F (36.3 C), temperature source Oral, resp. rate (!) 37, height 5\' 7"  (1.702 m), weight 55 kg, SpO2 96 %.   Intake/Output Summary (Last 24 hours) at 05/03/2020 1502 Last data filed at 05/03/2020 1300 Gross per 24 hour  Intake 3356.73 ml  Output 960 ml  Net 2396.73 ml   Last Weight  Most recent update: 04/28/2020  9:43 PM   Weight  55 kg (121 lb 4.1 oz)           Examination: General:  Pleasantly resting in bed, No acute distress. HEENT:  Normocephalic atraumatic.  Sclerae nonicteric, noninjected.  Extraocular movements intact bilaterally. Neck:  Without mass or deformity.  Trachea  is midline. Lungs:  Clear to auscultate bilaterally without rhonchi, wheeze, or rales. Heart:  Regular rate and rhythm.  Without murmurs, rubs, or gallops. Abdomen:  Soft, nontender, nondistended.  Without guarding or rebound. Extremities/skin: Do not apprecaite any splinter hemorrhages or Osler notes/Janeway; bilateral wrists bandages clean dry intact  Neurologic: Grossly normal   Consultants:   ID  Hand surgery  Procedures:   TTE 04/29/2020  Data Reviewed: I have personally reviewed following labs and imaging studies Results for orders placed or performed during the hospital encounter of 04/28/20 (from the past 24 hour(s))  Blood gas, arterial     Status: Abnormal   Collection Time: 05/02/20  9:45 PM  Result Value Ref Range   FIO2 21.00    pH, Arterial 7.503 (H) 7.35 - 7.45   pCO2 arterial 29.0 (L) 32 - 48 mmHg   pO2, Arterial 70.4 (L) 83 - 108 mmHg   Bicarbonate 22.3 20.0 - 28.0 mmol/L   Acid-Base Excess  0.5 0.0 - 2.0 mmol/L   O2 Saturation 93.2 %   Patient temperature 100.8    Allens test (pass/fail) PASS PASS  CBC with Differential/Platelet     Status: Abnormal   Collection Time: 05/03/20  2:19 AM  Result Value Ref Range   WBC 13.9 (H) 4.0 - 10.5 K/uL   RBC 2.90 (L) 4.22 - 5.81 MIL/uL   Hemoglobin 8.0 (L) 13.0 - 17.0 g/dL   HCT 16.1 (L) 39 - 52 %   MCV 82.1 80.0 - 100.0 fL   MCH 27.6 26.0 - 34.0 pg   MCHC 33.6 30.0 - 36.0 g/dL   RDW 09.6 04.5 - 40.9 %   Platelets 171 150 - 400 K/uL   nRBC 0.0 0.0 - 0.2 %   Neutrophils Relative % 76 %   Neutro Abs 10.6 (H) 1.7 - 7.7 K/uL   Lymphocytes Relative 11 %   Lymphs Abs 1.5 0.7 - 4.0 K/uL   Monocytes Relative 8 %   Monocytes Absolute 1.1 (H) 0 - 1 K/uL   Eosinophils Relative 1 %   Eosinophils Absolute 0.1 0 - 0 K/uL   Basophils Relative 0 %   Basophils Absolute 0.0 0 - 0 K/uL   Immature Granulocytes 4 %   Abs Immature Granulocytes 0.54 (H) 0.00 - 0.07 K/uL  Comprehensive metabolic panel     Status: Abnormal    Collection Time: 05/03/20  2:19 AM  Result Value Ref Range   Sodium 131 (L) 135 - 145 mmol/L   Potassium 4.3 3.5 - 5.1 mmol/L   Chloride 102 98 - 111 mmol/L   CO2 23 22 - 32 mmol/L   Glucose, Bld 106 (H) 70 - 99 mg/dL   BUN 57 (H) 6 - 20 mg/dL   Creatinine, Ser 8.11 0.61 - 1.24 mg/dL   Calcium 7.0 (L) 8.9 - 10.3 mg/dL   Total Protein 5.3 (L) 6.5 - 8.1 g/dL   Albumin 1.4 (L) 3.5 - 5.0 g/dL   AST 58 (H) 15 - 41 U/L   ALT 41 0 - 44 U/L   Alkaline Phosphatase 81 38 - 126 U/L   Total Bilirubin 0.5 0.3 - 1.2 mg/dL   GFR calc non Af Amer >60 >60 mL/min   GFR calc Af Amer >60 >60 mL/min   Anion gap 6 5 - 15  Magnesium     Status: None   Collection Time: 05/03/20  2:19 AM  Result Value Ref Range   Magnesium 2.3 1.7 - 2.4 mg/dL  Aerobic/Anaerobic Culture (surgical/deep wound)     Status: None (Preliminary result)   Collection Time: 05/03/20  9:35 AM   Specimen: Soft Tissue, Other  Result Value Ref Range   Specimen Description      TISSUE RT WRIST Performed at [facility], [address], [place], [place] [address]    Special Requests      PODIATRY [place] Moos Performed at [facility], [address] [place], [place] [address]    Gram Stain      RARE WBC PRESENT,BOTH PMN AND MONONUCLEAR RARE GRAM POSITIVE COCCI IN PAIRS IN CLUSTERS Performed at [facility], [address] [place], [place] [address]    Culture PENDING    Report Status PENDING   Aerobic/Anaerobic Culture (surgical/deep wound)     Status: None (Preliminary result)   Collection Time: 05/03/20  9:38 AM   Specimen: Soft Tissue, Other  Result Value Ref Range   Specimen Description  TISSUE LT HAND Performed at [facility], [address] [place], [place] [address]    Special Requests      [facility] Performed at [facility], [address] [place], [place] [address]    Gram Stain      NO WBC  SEEN RARE GRAM POSITIVE COCCI IN PAIRS IN CLUSTERS Performed at [facility], [address] [place], [place] [address]    Culture PENDING    Report Status PENDING     Recent Results (from the past 240 hour(s))  Culture, blood (routine x 2)     Status: Abnormal   Collection Time: 04/28/20  3:45 PM   Specimen: BLOOD  Result Value Ref Range Status   Specimen Description   Final    BLOOD LEFT ANTECUBITAL Performed at [facility], [address] [place], [place] [address]    Special Requests   Final    BOTTLES DRAWN AEROBIC AND ANAEROBIC Blood Culture adequate volume Performed at [facility], [address] [place], [place] [address]    Culture  Setup Time   Final    GRAM POSITIVE COCCI IN BOTH AEROBIC AND ANAEROBIC BOTTLES CRITICAL RESULT CALLED TO, READ BACK BY AND VERIFIED WITH: PHARMD M. Derrick RavelENZ [id number] FCP    Culture (A)  Final    STAPHYLOCOCCUS AUREUS METHICILLIN RESISTANT ORGANISM WAS NOT RECOVERED IN CULTURE. REPEATED FOR CONFIRMATION Performed at [facility], [address] [place], [place] [address]    Report Status 05/03/2020 FINAL  Final   Organism ID, Bacteria STAPHYLOCOCCUS AUREUS  Final      Susceptibility   Staphylococcus aureus - MIC*    CIPROFLOXACIN <=0.5 SENSITIVE Sensitive     ERYTHROMYCIN >=8 RESISTANT Resistant     GENTAMICIN <=0.5 SENSITIVE Sensitive     OXACILLIN 0.5 SENSITIVE Sensitive     TETRACYCLINE <=1 SENSITIVE Sensitive     VANCOMYCIN <=0.5 SENSITIVE Sensitive     TRIMETH/SULFA <=10 SENSITIVE Sensitive     CLINDAMYCIN <=0.25 SENSITIVE Sensitive     RIFAMPIN <=0.5 SENSITIVE Sensitive     Inducible Clindamycin NEGATIVE Sensitive     * STAPHYLOCOCCUS AUREUS  Blood Culture ID Panel (Reflexed)     Status: Abnormal   Collection Time: 04/28/20  3:45 PM  Result Value Ref Range Status   Enterococcus species NOT DETECTED NOT DETECTED Final   Listeria monocytogenes NOT DETECTED NOT DETECTED  Final   Staphylococcus species DETECTED (A) NOT DETECTED Final    Comment: CRITICAL RESULT CALLED TO, READ BACK BY AND VERIFIED WITH: PHARMD Lynder ParentsM. RENZ [id number] FCP    Staphylococcus aureus (BCID) DETECTED (A) NOT DETECTED Final    Comment: Methicillin (oxacillin)-resistant Staphylococcus aureus (MRSA). MRSA is predictably resistant to beta-lactam antibiotics (except ceftaroline). Preferred therapy is vancomycin unless clinically contraindicated. Patient requires contact precautions if  hospitalized. CRITICAL RESULT CALLED TO, READ BACK BY AND VERIFIED WITH: PHARMD M. Derrick RavelENZ [phone] FCP    Methicillin resistance DETECTED (A) NOT DETECTED Final    Comment: CRITICAL RESULT CALLED TO, READ BACK BY AND VERIFIED WITH: PHARMD M. RENZ [id number] FCP    Streptococcus species NOT DETECTED NOT DETECTED Final   Streptococcus agalactiae NOT DETECTED NOT DETECTED Final   Streptococcus pneumoniae NOT DETECTED NOT DETECTED Final   Streptococcus pyogenes NOT DETECTED NOT DETECTED Final   Acinetobacter baumannii NOT DETECTED NOT DETECTED Final   Enterobacteriaceae species NOT DETECTED NOT DETECTED Final  Enterobacter cloacae complex NOT DETECTED NOT DETECTED Final   Escherichia coli NOT DETECTED NOT DETECTED Final   Klebsiella oxytoca NOT DETECTED NOT DETECTED Final   Klebsiella pneumoniae NOT DETECTED NOT DETECTED Final   Proteus species NOT DETECTED NOT DETECTED Final   Serratia marcescens NOT DETECTED NOT DETECTED Final   Haemophilus influenzae NOT DETECTED NOT DETECTED Final   Neisseria meningitidis NOT DETECTED NOT DETECTED Final   Pseudomonas aeruginosa NOT DETECTED NOT DETECTED Final   Candida albicans NOT DETECTED NOT DETECTED Final   Candida glabrata NOT DETECTED NOT DETECTED Final   Candida krusei NOT DETECTED NOT DETECTED Final   Candida parapsilosis NOT DETECTED NOT DETECTED Final   Candida tropicalis NOT DETECTED NOT DETECTED Final    Comment: Performed at [facility],  [address], [place], [place] [address]  Culture, blood (routine x 2)     Status: Abnormal   Collection Time: 04/28/20  4:13 PM   Specimen: BLOOD  Result Value Ref Range Status   Specimen Description   Final    BLOOD RIGHT ANTECUBITAL Performed at [facility], [address] [place], [place] [address]    Special Requests   Final    BOTTLES DRAWN AEROBIC AND ANAEROBIC Blood Culture adequate volume Performed at [facility], [address] [place], [place] [address]    Culture  Setup Time   Final    GRAM POSITIVE COCCI IN BOTH AEROBIC AND ANAEROBIC BOTTLES CRITICAL VALUE NOTED.  VALUE IS CONSISTENT WITH PREVIOUSLY REPORTED AND CALLED VALUE.    Culture (A)  Final    STAPHYLOCOCCUS AUREUS METHICILLIN RESISTANT ORGANISM WAS NOT RECOVERED IN CULTURE. REPEATED FOR CONFIRMATION Performed at [facility], [address] [place], [place] [address]    Report Status 05/03/2020 FINAL  Final   Organism ID, Bacteria STAPHYLOCOCCUS AUREUS  Final      Susceptibility   Staphylococcus aureus - MIC*    CIPROFLOXACIN <=0.5 SENSITIVE Sensitive     ERYTHROMYCIN >=8 RESISTANT Resistant     GENTAMICIN <=0.5 SENSITIVE Sensitive     OXACILLIN 0.5 SENSITIVE Sensitive     TETRACYCLINE <=1 SENSITIVE Sensitive     VANCOMYCIN <=0.5 SENSITIVE Sensitive     TRIMETH/SULFA <=10 SENSITIVE Sensitive     CLINDAMYCIN <=0.25 SENSITIVE Sensitive     RIFAMPIN <=0.5 SENSITIVE Sensitive     Inducible Clindamycin NEGATIVE Sensitive     * STAPHYLOCOCCUS AUREUS  Urine culture     Status: Abnormal   Collection Time: 04/28/20  7:00 PM   Specimen: In/Out Cath Urine  Result Value Ref Range Status   Specimen Description   Final    IN/OUT CATH URINE Performed at [facility], [address], [place], [place] [address]    Special Requests   Final    NONE Performed at [facility], [address], [place], [place] [address]    Culture  >=100,000 COLONIES/mL STAPHYLOCOCCUS AUREUS (A)  Final   Report Status 05/01/2020 FINAL  Final   Organism ID, Bacteria STAPHYLOCOCCUS AUREUS (A)  Final      Susceptibility   Staphylococcus aureus - MIC*    CIPROFLOXACIN <=0.5 SENSITIVE Sensitive     GENTAMICIN <=0.5 SENSITIVE Sensitive     NITROFURANTOIN <=16 SENSITIVE Sensitive     OXACILLIN 0.5 SENSITIVE Sensitive     TETRACYCLINE <=1 SENSITIVE Sensitive     VANCOMYCIN <=0.5 SENSITIVE Sensitive     TRIMETH/SULFA <=10 SENSITIVE Sensitive  CLINDAMYCIN <=0.25 SENSITIVE Sensitive     RIFAMPIN <=0.5 SENSITIVE Sensitive     Inducible Clindamycin NEGATIVE Sensitive     * >=100,000 COLONIES/mL STAPHYLOCOCCUS AUREUS  SARS Coronavirus 2 by RT PCR (hospital order, performed in [facility] hospital lab) Nasopharyngeal Nasopharyngeal Swab     Status: None   Collection Time: 04/28/20  7:03 PM   Specimen: Nasopharyngeal Swab  Result Value Ref Range Status   SARS Coronavirus 2 NEGATIVE NEGATIVE Final    Comment: (NOTE) SARS-CoV-2 target nucleic acids are NOT DETECTED.  The SARS-CoV-2 RNA is generally detectable in upper and lower respiratory specimens during the acute phase of infection. The lowest concentration of SARS-CoV-2 viral copies this assay can detect is 250 copies / mL. A negative result does not preclude SARS-CoV-2 infection and should not be used as the sole basis for treatment or other patient management decisions.  A negative result may occur with improper specimen collection / handling, submission of specimen other than nasopharyngeal swab, presence of viral mutation(s) within the areas targeted by this assay, and inadequate number of viral copies (<250 copies / mL). A negative result must be combined with clinical observations, patient history, and epidemiological information.  Fact Sheet for Patients:   BoilerBrush.com.cy  Fact Sheet for Healthcare  Providers: https://pope.com/  This test is not yet approved or  cleared by the [place] FDA and has been authorized for detection and/or diagnosis of SARS-CoV-2 by FDA under an Emergency Use Authorization (EUA).  This EUA will remain in effect (meaning this test can be used) for the duration of the COVID-19 declaration under Section 564(b)(1) of the Act, 21 U.S.C. section 360bbb-3(b)(1), unless the authorization is terminated or revoked sooner.  Performed at [facility], [address], [place], [place] [address]   MRSA PCR Screening     Status: Abnormal   Collection Time: 04/28/20  7:45 PM   Specimen: Nasal Mucosa; Nasopharyngeal  Result Value Ref Range Status   MRSA by PCR POSITIVE (A) NEGATIVE Final    Comment:        The GeneXpert MRSA Assay (FDA approved for NASAL specimens only), is one component of a comprehensive MRSA colonization surveillance program. It is not intended to diagnose MRSA infection nor to guide or monitor treatment for MRSA infections. RESULT CALLED TO, READ BACK BY AND VERIFIED WITH: S SMITH AT 2314 ON 04/28/2020 BY MOSLEY,J  Performed at [facility], [address] [place], [place] [address]   Culture, blood (Routine X 2) w Reflex to ID Panel     Status: None (Preliminary result)   Collection Time: 04/30/20  8:15 AM   Specimen: BLOOD  Result Value Ref Range Status   Specimen Description   Final    BLOOD LEFT ANTECUBITAL Performed at [facility], [address], [place], [place] [address]    Special Requests   Final    BOTTLES DRAWN AEROBIC AND ANAEROBIC Blood Culture adequate volume Performed at [facility], [address], [place], [place] [address]    Culture   Final    NO GROWTH 3 DAYS Performed at [facility], [address], [place], [place] [address]    Report Status PENDING  Incomplete  Culture, blood (Routine X 2) w Reflex to ID  Panel     Status: None (Preliminary result)   Collection Time: 04/30/20  8:15 AM   Specimen: BLOOD LEFT FOREARM  Result Value Ref Range Status   Specimen  Description   Final    BLOOD LEFT FOREARM Performed at [facility], [address] [place], [place] [address]    Special Requests   Final    BOTTLES DRAWN AEROBIC ONLY Blood Culture adequate volume Performed at [facility], [address], [place], [place] [address]    Culture   Final    NO GROWTH 3 DAYS Performed at [facility], [address] [place], [place] [address]    Report Status PENDING  Incomplete  Aerobic/Anaerobic Culture (surgical/deep wound)     Status: None (Preliminary result)   Collection Time: 05/03/20  9:35 AM   Specimen: Soft Tissue, Other  Result Value Ref Range Status   Specimen Description   Final    TISSUE RT WRIST Performed at [facility], [address], [place], [place] [address]    Special Requests   Final    PODIATRY [place]a Moos Performed at [facility], [address] [place], [place] [address]    Gram Stain   Final    RARE WBC PRESENT,BOTH PMN AND MONONUCLEAR RARE GRAM POSITIVE COCCI IN PAIRS IN CLUSTERS Performed at [facility], [address] [place], [place] [address]    Culture PENDING  Incomplete   Report Status PENDING  Incomplete  Aerobic/Anaerobic Culture (surgical/deep wound)     Status: None (Preliminary result)   Collection Time: 05/03/20  9:38 AM   Specimen: Soft Tissue, Other  Result Value Ref Range Status   Specimen Description   Final    TISSUE LT HAND Performed at [facility], [address], [place], [place] [address]    Special Requests   Final    PATIENT ON FOLLOWING ANCEF, [facility] Performed at [facility], [address] [place], [place] [address]    Gram Stain   Final    NO WBC SEEN RARE GRAM POSITIVE COCCI IN PAIRS IN  CLUSTERS Performed at [facility], [address], [place], [place] [address]    Culture PENDING  Incomplete   Report Status PENDING  Incomplete     Radiology Studies: DG CHEST PORT 1 VIEW  Result Date: 05/03/2020 CLINICAL DATA:  Sepsis, shortness of breath EXAM: PORTABLE CHEST 1 VIEW COMPARISON:  Chest x-ray 05/02/2020.  Chest CT 04/28/2020. FINDINGS: Multiple masslike nodular airspace opacities are seen throughout the lungs, stable. Heart is borderline in size. No visible effusions or pneumothorax. No real change since prior study. IMPRESSION: No significant change in numerous masslike airspace opacities. Electronically Signed   By: Charlett Nose M.D.   On: 05/03/2020 08:33   DG CHEST PORT 1 VIEW  Result Date: 05/02/2020 CLINICAL DATA:  29 year old male with increasing shortness of breath. EXAM: PORTABLE CHEST 1 VIEW COMPARISON:  Chest radiograph dated 04/28/2020 and CT dated 04/28/2020 FINDINGS: Interval worsening of bilateral confluent and nodular pulmonary opacities compared to the prior radiograph. There is shallow inspiration. No large pleural effusion or pneumothorax. Stable cardiac silhouette. No acute osseous pathology. IMPRESSION: Interval worsening of the bilateral pulmonary opacities. Electronically Signed   By: Elgie Collard M.D.   On: 05/02/2020 22:00   DG CHEST PORT 1 VIEW  Final Result    DG CHEST PORT 1 VIEW  Final Result    CT WRIST RIGHT W CONTRAST  Final Result    CT HAND LEFT W CONTRAST  Final Result    US Abdomen Limited RUQ  Final Result    CT  Abdomen Pelvis W Contrast  Final Result    CT Head Wo Contrast  Final Result    CT Cervical Spine Wo Contrast  Final Result    CT Chest W Contrast  Final Result    DG Chest [facility] 1 View  Final Result    DG Wrist Complete Right  Final Result    DG Hand Complete Right  Final Result    DG Hand Complete Left  Final Result       Scheduled Meds: . calcium-vitamin D  2 tablet Oral BID  .  Chlorhexidine Gluconate Cloth  6 each Topical Q0600  . docusate sodium  100 mg Oral BID  . enoxaparin (LOVENOX) injection  40 mg Subcutaneous Q24H  . feeding supplement  1 Container Oral TID BM  . fentaNYL      . mouth rinse  15 mL Mouth Rinse BID  . mupirocin ointment  1 application Nasal BID  . sodium chloride flush  3 mL Intravenous Q12H   PRN Meds: acetaminophen **OR** acetaminophen, ipratropium-albuterol, LORazepam, ondansetron **OR** ondansetron (ZOFRAN) IV, oxyCODONE Continuous Infusions: . ceFAZolin Stopped (05/03/20 0930)  . lactated ringers 0 mL (05/03/20 0746)  . vancomycin Stopped (05/03/20 0305)      LOS: 5 days  Time spent:  Azucena Fallen, DO [facility] 05/03/2020, 3:02 PM  Contact via secure chat.

## 2020-05-03 NOTE — Anesthesia Postprocedure Evaluation (Signed)
Anesthesia Post Note  Patient: Clifford Clark  Procedure(s) Performed: BILATERAL IRRIGATION AND DEBRIDEMENT HANDS (Bilateral )     Patient location during evaluation: PACU Anesthesia Type: General Level of consciousness: awake and alert Pain management: pain level controlled Vital Signs Assessment: post-procedure vital signs reviewed and stable Respiratory status: spontaneous breathing, nonlabored ventilation, respiratory function stable and patient connected to nasal cannula oxygen Cardiovascular status: blood pressure returned to baseline and stable Postop Assessment: no apparent nausea or vomiting Anesthetic complications: no   No complications documented.  Last Vitals:  Vitals:   05/03/20 1015 05/03/20 1030  BP: 109/70 113/75  Pulse: (!) 109 (!) 107  Resp: (!) 38 (!) 44  Temp:  (!) 36.4 C  SpO2: 100% 97%    Last Pain:  Vitals:   05/03/20 1030  TempSrc:   PainSc: Asleep                 Clifford Clark

## 2020-05-03 NOTE — Progress Notes (Addendum)
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 homeless, asthma who presented to the ER after being found on the ground sleeping by a friend he stated.  EMS was called and he was transported to the hospital. In the ER with multiple imaging studies including x-ray of his right hand and wrist, x-ray of left hand, CXR, CT chest/abdomen/pelvis, CT C-spine, and CT head.  There were no acute fractures in his bilateral upper extremities.  CT head also unremarkable.  Remainder of his imaging studies revealed what appeared to be septic pulmonary emboli as well as septic emboli involving both kidneys. Cultures were drawn and he was started on vancomycin, cefepime, and Flagyl in the ER - 4/4 blood cultures were growing MRSA. TTE also performed on 7/9 revealing TV vegetation. Became verbally abusive/threatening 05/01/20 and Psych was consulted and a sitter was requested.   7/12 Patient developed worsening swelling in his right wrist and left hand. There was concern for septic joint or bursitis. A CT was ordered bilaterally which did show bilateral abscesses.  7/13 Bilateral I&D complaining of pain - admits to taking Carfentanyl at home/off the street.    Interval History/ROS:  Complaining of uncontrolled postoperative pain.  Patient otherwise denies nausea, vomiting, diarrhea, headache, fevers, chills.  Reports some constipation, has not had bowel movement in a "sometime" but no definitive timeline.     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 - TTE performed on 04/29/20: TV vegetation (14mm) noted as well as possible veg on PV -ID following, recommending TEE with possible need for angio vac pending findings - will need minimum 6 weeks IV  vanc - trend BC until clear (7/10 cultures remain NGTD) prior to PICC placement; patient will need to remain in hospital for duration of treatment course; no indication for IVC if tries to leave AMA - other complaint to keep note of is ongoing back pain (diffuse, even paraspinal) so suspicion for discitis/OM is lower currently as pain is diffuse, however if does not improve may need to be further considered, especially if blood cultures are difficult to clear  - he is high risk for leaving AMA and for worsening infection and/or death if he does; called and spoke with his mom Clifford Clark on 04/29/20 and updated her as well as told her all this as well  Hand abscess - Likely secondary to above - bilateral abscesses on hands, s/p CT on 7/11 - appreciate hand surgery consult; I&D on 7/13  Sepsis (HCC) secondary to above - see MRSA bacteremia  Acute metabolic encephalopathy, resolved - considered due to IV drugs as well as sepsis/infection - mentation has cleared and diet started   Thrombocytopenia (HCC) - likely due to acute illness/sepsis; cannot rule out other etiologies yet - trend CBC with infection treatment; slowly improving   Hyponatremia - replete and recheck as needed  Elevated LFTs - possibly due to current infection/sepsis vs risk for hepatitis from IV drug use - hepatitis panel noted for hep C Ab positive - check HCV Quant VL: not elevated - LFTs improving with infection treatment  Hypokalemia -Replete and recheck as needed  Malnutrition of moderate degree Per RD: "Moderate Malnutrition related to social /  environmental circumstances as evidenced by energy intake < 75% for > or equal to 3 months, moderate fat depletion, moderate muscle depletion, percent weight loss." - appreciate assistance - diet advanced on 7/10  Threatening behavior - on 7/11, patient endorsed multiple homicidal ideations to nurse and CNA in room; he threatened that he was going "to kill the attending" and  all their family and kids; this was voiced several times despite nursing attempting to re-direct patient - will request psych eval at this time; also have ordered sitter - he already has oxycodone for pain and opioid w/d as well as ativan for anxiety/agitation  AKI - BUN has been uptrending; creatinine stable - restart on IVF on 7/12 and monitor BUN response Lab Results  Component Value Date   CREATININE 1.00 05/03/2020   CREATININE 1.21 05/02/2020   CREATININE 1.38 (H) 05/01/2020     Antimicrobials: Vanc 04/28/20 >> present Cefepime 04/28/20 - 04/29/20 Flagyl 04/28/20 - 04/29/20  DVT prophylaxis: Lovenox Code Status: Full Family Communication: Mother Disposition Plan:  Status is: Inpatient  Remains inpatient appropriate because:Ongoing active pain requiring inpatient pain management, Unsafe d/c plan, IV treatments appropriate due to intensity of illness or inability to take PO and Inpatient level of care appropriate due to severity of illness   Dispo: The patient is from: Home              Anticipated d/c is to: Home              Anticipated d/c date is: > 3 days              Patient currently is not medically stable to d/c.  Objective: Blood pressure 118/83, pulse (!) 119, temperature 98.4 F (36.9 C), temperature source Oral, resp. rate (!) 44, height  (1.702 m), weight 55 kg, SpO2 94 %.   Intake/Output Summary (Last 24 hours) at 05/03/2020 0832 Last data filed at 05/03/2020 0300 Gross per 24 hour  Intake 2907.16 ml  Output 1325 ml  Net 1582.16 ml   Last Weight  Most recent update: 04/28/2020  9:43 PM   Weight  55 kg (121 lb 4.1 oz)           Examination: General:  Pleasantly resting in bed, No acute distress. HEENT:  Normocephalic atraumatic.  Sclerae nonicteric, noninjected.  Extraocular movements intact bilaterally. Neck:  Without mass or deformity.  Trachea is midline. Lungs:  Clear to auscultate bilaterally without rhonchi, wheeze, or rales. Heart:  Regular rate  and rhythm.  Without murmurs, rubs, or gallops. Abdomen:  Soft, nontender, nondistended.  Without guarding or rebound. Extremities/skin: Do not apprecaite any splinter hemorrhages or Osler notes/Janeway; bilateral wrists bandages clean dry intact  Neurologic: Grossly normal   Consultants:   ID  Hand surgery  Procedures:   TTE 04/29/2020  Data Reviewed: I have personally reviewed following labs and imaging studies Results for orders placed or performed during the hospital encounter of 04/28/20 (from the past 24 hour(s))  Blood gas, arterial     Status: Abnormal   Collection Time: 05/02/20  9:45 PM  Result Value Ref Range   FIO2 21.00    pH, Arterial 7.503 (H) 7.35 - 7.45   pCO2 arterial 29.0 (L) 32 - 48 mmHg   pO2, Arterial 70.4 (L) 83 - 108 mmHg   Bicarbonate 22.3 20.0 - 28.0 mmol/L   Acid-Base Excess 0.5 0.0 - 2.0 mmol/L   O2 Saturation 93.2 %   Patient temperature 100.8    Allens  test (pass/fail) PASS PASS  CBC with Differential/Platelet     Status: Abnormal   Collection Time: 05/03/20  2:19 AM  Result Value Ref Range   WBC 13.9 (H) 4.0 - 10.5 K/uL   RBC 2.90 (L) 4.22 - 5.81 MIL/uL   Hemoglobin 8.0 (L) 13.0 - 17.0 g/dL   HCT 30.1 (L) 39 - 52 %   MCV 82.1 80.0 - 100.0 fL   MCH 27.6 26.0 - 34.0 pg   MCHC 33.6 30.0 - 36.0 g/dL   RDW 60.1 09.3 - 23.5 %   Platelets 171 150 - 400 K/uL   nRBC 0.0 0.0 - 0.2 %   Neutrophils Relative % 76 %   Neutro Abs 10.6 (H) 1.7 - 7.7 K/uL   Lymphocytes Relative 11 %   Lymphs Abs 1.5 0.7 - 4.0 K/uL   Monocytes Relative 8 %   Monocytes Absolute 1.1 (H) 0 - 1 K/uL   Eosinophils Relative 1 %   Eosinophils Absolute 0.1 0 - 0 K/uL   Basophils Relative 0 %   Basophils Absolute 0.0 0 - 0 K/uL   Immature Granulocytes 4 %   Abs Immature Granulocytes 0.54 (H) 0.00 - 0.07 K/uL  Comprehensive metabolic panel     Status: Abnormal   Collection Time: 05/03/20  2:19 AM  Result Value Ref Range   Sodium 131 (L) 135 - 145 mmol/L   Potassium 4.3 3.5 -  5.1 mmol/L   Chloride 102 98 - 111 mmol/L   CO2 23 22 - 32 mmol/L   Glucose, Bld 106 (H) 70 - 99 mg/dL   BUN 57 (H) 6 - 20 mg/dL   Creatinine, Ser 5.73 0.61 - 1.24 mg/dL   Calcium 7.0 (L) 8.9 - 10.3 mg/dL   Total Protein 5.3 (L) 6.5 - 8.1 g/dL   Albumin 1.4 (L) 3.5 - 5.0 g/dL   AST 58 (H) 15 - 41 U/L   ALT 41 0 - 44 U/L   Alkaline Phosphatase 81 38 - 126 U/L   Total Bilirubin 0.5 0.3 - 1.2 mg/dL   GFR calc non Af Amer >60 >60 mL/min   GFR calc Af Amer >60 >60 mL/min   Anion gap 6 5 - 15  Magnesium     Status: None   Collection Time: 05/03/20  2:19 AM  Result Value Ref Range   Magnesium 2.3 1.7 - 2.4 mg/dL    Recent Results (from the past 240 hour(s))  Culture, blood (routine x 2)     Status: Abnormal (Preliminary result)   Collection Time: 04/28/20  3:45 PM   Specimen: BLOOD  Result Value Ref Range Status   Specimen Description   Final    BLOOD LEFT ANTECUBITAL Performed at [facility], [address], [place], [place] [address]    Special Requests   Final    BOTTLES DRAWN AEROBIC AND ANAEROBIC Blood Culture adequate volume Performed at [facility], [address], [place], [place] [address]    Culture  Setup Time   Final    GRAM POSITIVE COCCI IN BOTH AEROBIC AND ANAEROBIC BOTTLES CRITICAL RESULT CALLED TO, READ BACK BY AND VERIFIED WITH: PHARMD Lynder Parents [phone] FCP    Culture (A)  Final    STAPHYLOCOCCUS AUREUS CULTURE REINCUBATED FOR BETTER GROWTH Performed at [facility], [address] [place], [place] [address]    Report Status PENDING  Incomplete  Blood Culture ID Panel (Reflexed)     Status: Abnormal  Collection Time: 04/28/20  3:45 PM  Result Value Ref Range Status   Enterococcus species NOT DETECTED NOT DETECTED Final   Listeria monocytogenes NOT DETECTED NOT DETECTED Final   Staphylococcus species DETECTED (A) NOT DETECTED Final    Comment: CRITICAL RESULT CALLED TO, READ BACK BY AND VERIFIED  WITH: PHARMD Lynder Parents [phone] FCP    Staphylococcus aureus (BCID) DETECTED (A) NOT DETECTED Final    Comment: Methicillin (oxacillin)-resistant Staphylococcus aureus (MRSA). MRSA is predictably resistant to beta-lactam antibiotics (except ceftaroline). Preferred therapy is vancomycin unless clinically contraindicated. Patient requires contact precautions if  hospitalized. CRITICAL RESULT CALLED TO, READ BACK BY AND VERIFIED WITH: PHARMD M. Derrick Ravel [phone] FCP    Methicillin resistance DETECTED (A) NOT DETECTED Final    Comment: CRITICAL RESULT CALLED TO, READ BACK BY AND VERIFIED WITH: PHARMD M. RENZ [id number] [phone] FCP    Streptococcus species NOT DETECTED NOT DETECTED Final   Streptococcus agalactiae NOT DETECTED NOT DETECTED Final   Streptococcus pneumoniae NOT DETECTED NOT DETECTED Final   Streptococcus pyogenes NOT DETECTED NOT DETECTED Final   Acinetobacter baumannii NOT DETECTED NOT DETECTED Final   Enterobacteriaceae species NOT DETECTED NOT DETECTED Final   Enterobacter cloacae complex NOT DETECTED NOT DETECTED Final   Escherichia coli NOT DETECTED NOT DETECTED Final   Klebsiella oxytoca NOT DETECTED NOT DETECTED Final   Klebsiella pneumoniae NOT DETECTED NOT DETECTED Final   Proteus species NOT DETECTED NOT DETECTED Final   Serratia marcescens NOT DETECTED NOT DETECTED Final   Haemophilus influenzae NOT DETECTED NOT DETECTED Final   Neisseria meningitidis NOT DETECTED NOT DETECTED Final   Pseudomonas aeruginosa NOT DETECTED NOT DETECTED Final   Candida albicans NOT DETECTED NOT DETECTED Final   Candida glabrata NOT DETECTED NOT DETECTED Final   Candida krusei NOT DETECTED NOT DETECTED Final   Candida parapsilosis NOT DETECTED NOT DETECTED Final   Candida tropicalis NOT DETECTED NOT DETECTED Final    Comment: Performed at [facility], [address] [place], [place] [address]  Culture, blood (routine x 2)     Status: Abnormal (Preliminary result)   Collection Time:  04/28/20  4:13 PM   Specimen: BLOOD  Result Value Ref Range Status   Specimen Description   Final    BLOOD RIGHT ANTECUBITAL Performed at [facility], [address] [place], [place] [address]    Special Requests   Final    BOTTLES DRAWN AEROBIC AND ANAEROBIC Blood Culture adequate volume Performed at [facility], [address], [place], [place] [address]    Culture  Setup Time   Final    GRAM POSITIVE COCCI IN BOTH AEROBIC AND ANAEROBIC BOTTLES CRITICAL VALUE NOTED.  VALUE IS CONSISTENT WITH PREVIOUSLY REPORTED AND CALLED VALUE.    Culture (A)  Final    STAPHYLOCOCCUS AUREUS REPEATING SUSCEPTIBILITIES Performed at [facility], [address] [place], [place] [address]    Report Status PENDING  Incomplete   Organism ID, Bacteria STAPHYLOCOCCUS AUREUS  Final      Susceptibility   Staphylococcus aureus - MIC*    CIPROFLOXACIN <=0.5 SENSITIVE Sensitive     ERYTHROMYCIN >=8 RESISTANT Resistant     GENTAMICIN <=0.5 SENSITIVE Sensitive     OXACILLIN 0.5 SENSITIVE Sensitive     TETRACYCLINE <=1 SENSITIVE Sensitive     VANCOMYCIN <=0.5 SENSITIVE Sensitive     TRIMETH/SULFA <=10 SENSITIVE Sensitive     CLINDAMYCIN <=0.25 SENSITIVE Sensitive     RIFAMPIN <=0.5  SENSITIVE Sensitive     Inducible Clindamycin NEGATIVE Sensitive     * STAPHYLOCOCCUS AUREUS  Urine culture     Status: Abnormal   Collection Time: 04/28/20  7:00 PM   Specimen: In/Out Cath Urine  Result Value Ref Range Status   Specimen Description   Final    IN/OUT CATH URINE Performed at [facility], [address], [place], [place] [address]    Special Requests   Final    NONE Performed at [facility], [address], [place], [place] [address]    Culture >=100,000 COLONIES/mL STAPHYLOCOCCUS AUREUS (A)  Final   Report Status 05/01/2020 FINAL  Final   Organism ID, Bacteria STAPHYLOCOCCUS AUREUS (A)  Final      Susceptibility    Staphylococcus aureus - MIC*    CIPROFLOXACIN <=0.5 SENSITIVE Sensitive     GENTAMICIN <=0.5 SENSITIVE Sensitive     NITROFURANTOIN <=16 SENSITIVE Sensitive     OXACILLIN 0.5 SENSITIVE Sensitive     TETRACYCLINE <=1 SENSITIVE Sensitive     VANCOMYCIN <=0.5 SENSITIVE Sensitive     TRIMETH/SULFA <=10 SENSITIVE Sensitive     CLINDAMYCIN <=0.25 SENSITIVE Sensitive     RIFAMPIN <=0.5 SENSITIVE Sensitive     Inducible Clindamycin NEGATIVE Sensitive     * >=100,000 COLONIES/mL STAPHYLOCOCCUS AUREUS  SARS Coronavirus 2 by RT PCR (hospital order, performed in [facility] hospital lab) Nasopharyngeal Nasopharyngeal Swab     Status: None   Collection Time: 04/28/20  7:03 PM   Specimen: Nasopharyngeal Swab  Result Value Ref Range Status   SARS Coronavirus 2 NEGATIVE NEGATIVE Final    Comment: (NOTE) SARS-CoV-2 target nucleic acids are NOT DETECTED.  The SARS-CoV-2 RNA is generally detectable in upper and lower respiratory specimens during the acute phase of infection. The lowest concentration of SARS-CoV-2 viral copies this assay can detect is 250 copies / mL. A negative result does not preclude SARS-CoV-2 infection and should not be used as the sole basis for treatment or other patient management decisions.  A negative result may occur with improper specimen collection / handling, submission of specimen other than nasopharyngeal swab, presence of viral mutation(s) within the areas targeted by this assay, and inadequate number of viral copies (<250 copies / mL). A negative result must be combined with clinical observations, patient history, and epidemiological information.  Fact Sheet for Patients:   BoilerBrush.com.cy  Fact Sheet for Healthcare Providers: https://pope.com/  This test is not yet approved or  cleared by the [place] FDA and has been authorized for detection and/or diagnosis of SARS-CoV-2 by FDA under an Emergency Use  Authorization (EUA).  This EUA will remain in effect (meaning this test can be used) for the duration of the COVID-19 declaration under Section 564(b)(1) of the Act, 21 U.S.C. section 360bbb-3(b)(1), unless the authorization is terminated or revoked sooner.  Performed at [facility], [address] [place], [place] [address]   MRSA PCR Screening     Status: Abnormal   Collection Time: 04/28/20  7:45 PM   Specimen: Nasal Mucosa; Nasopharyngeal  Result Value Ref Range Status   MRSA by PCR POSITIVE (A) NEGATIVE Final    Comment:        The GeneXpert MRSA Assay (FDA approved for NASAL specimens only), is one component of a comprehensive MRSA colonization surveillance program. It is not intended to diagnose MRSA infection nor to guide or monitor treatment for MRSA infections. RESULT CALLED TO, READ BACK BY AND VERIFIED  WITH: S SMITH AT 2314 ON 04/28/2020 BY MOSLEY,J  Performed at [facility], [address] [place], [place] [address]   Culture, blood (Routine X 2) w Reflex to ID Panel     Status: None (Preliminary result)   Collection Time: 04/30/20  8:15 AM   Specimen: BLOOD  Result Value Ref Range Status   Specimen Description   Final    BLOOD LEFT ANTECUBITAL Performed at [facility], [address] [place], [place] [address]    Special Requests   Final    BOTTLES DRAWN AEROBIC AND ANAEROBIC Blood Culture adequate volume Performed at [facility], [address] [place], [place] [address]    Culture   Final    NO GROWTH 2 DAYS Performed at [facility], [address] [place], [place] [address]    Report Status PENDING  Incomplete  Culture, blood (Routine X 2) w Reflex to ID Panel     Status: None (Preliminary result)   Collection Time: 04/30/20  8:15 AM   Specimen: BLOOD LEFT FOREARM  Result Value Ref Range Status   Specimen Description   Final    BLOOD LEFT FOREARM Performed at [facility], [address] [place], [place] [address]    Special Requests   Final    BOTTLES DRAWN AEROBIC ONLY Blood Culture adequate volume Performed at [facility], [address] [place], [place] [address]    Culture   Final    NO GROWTH 2 DAYS Performed at [facility], [address] [place], [place] [address]    Report Status PENDING  Incomplete     Radiology Studies: CT WRIST RIGHT W CONTRAST  Result Date: 05/01/2020 CLINICAL DATA:  Wrist pain and swelling. EXAM: CT OF THE UPPER RIGHT EXTREMITY WITH CONTRAST TECHNIQUE: Multidetector CT imaging of the upper right extremity was performed according to the standard protocol following intravenous contrast administration. COMPARISON:  Radiographs same date. CONTRAST:  100mL OMNIPAQUE IOHEXOL 300 MG/ML  SOLN FINDINGS: The bony structures are intact. No fractures or destructive bony changes. The joint spaces are maintained. Focal soft tissue thickening and suspected subcutaneous abscess wrapping around the dorsal and ulnar aspect of the distal ulna. This measures a maximum of 2.7 cm. I do not see any definite findings for septic arthritis or osteomyelitis. Diffuse subcutaneous soft tissue swelling/edema suggesting cellulitis. IMPRESSION: 1. Focal soft tissue thickening and suspected subcutaneous abscess wrapping around the dorsal and ulnar aspect of the distal ulna. 2. Diffuse subcutaneous soft tissue swelling/edema suggesting cellulitis. 3. No definite CT findings for septic arthritis or osteomyelitis. Electronically Signed   By: Rudie MeyerP.  Gallerani M.D.   On: 05/01/2020 13:42   CT HAND LEFT W CONTRAST  Result Date: 05/01/2020 CLINICAL DATA:  Pain and swelling. EXAM: CT OF THE UPPER LEFT EXTREMITY WITH CONTRAST TECHNIQUE: Multidetector CT imaging of the upper left extremity was performed according to the standard protocol following intravenous contrast administration. CONTRAST:  100mL OMNIPAQUE IOHEXOL 300 MG/ML   SOLN COMPARISON:  Radiographs, same date. FINDINGS: Marked soft tissue thickening noted over the dorsum of the hand overlying mainly the second and third MCP joints. There is a rim enhancing abscess noted overlying the second MCP joint which measures a maximum of 2.8 cm. There is also diffuse cellulitis and suspected myositis. I do not see any obvious destructive bony changes to suggest septic arthritis or osteomyelitis but MRI would be much more sensitive for these  2 entities. IMPRESSION: 1. 2.8 cm rim enhancing abscess overlying the second MCP joint. 2. Diffuse cellulitis and suspected myositis. 3. No obvious destructive bony changes to suggest septic arthritis or osteomyelitis but MRI would be much more sensitive for these 2 entities. Electronically Signed   By: Rudie Meyer M.D.   On: 05/01/2020 13:49   DG CHEST PORT 1 VIEW  Result Date: 05/02/2020 CLINICAL DATA:  29 year old male with increasing shortness of breath. EXAM: PORTABLE CHEST 1 VIEW COMPARISON:  Chest radiograph dated 04/28/2020 and CT dated 04/28/2020 FINDINGS: Interval worsening of bilateral confluent and nodular pulmonary opacities compared to the prior radiograph. There is shallow inspiration. No large pleural effusion or pneumothorax. Stable cardiac silhouette. No acute osseous pathology. IMPRESSION: Interval worsening of the bilateral pulmonary opacities. Electronically Signed   By: Elgie Collard M.D.   On: 05/02/2020 22:00   DG CHEST PORT 1 VIEW  Final Result    CT WRIST RIGHT W CONTRAST  Final Result    CT HAND LEFT W CONTRAST  Final Result    US Abdomen Limited RUQ  Final Result    CT Abdomen Pelvis W Contrast  Final Result    CT Head Wo Contrast  Final Result    CT Cervical Spine Wo Contrast  Final Result    CT Chest W Contrast  Final Result    DG Chest Port 1 View  Final Result    DG Wrist Complete Right  Final Result    DG Hand Complete Right  Final Result    DG Hand Complete Left  Final Result      DG CHEST PORT 1 VIEW    (Results Pending)     Scheduled Meds: . [MAR Hold] calcium-vitamin D  2 tablet Oral BID  . chlorhexidine  60 mL Topical Once  . [MAR Hold] Chlorhexidine Gluconate Cloth  6 each Topical Q0600  . [MAR Hold] docusate sodium  100 mg Oral BID  . [MAR Hold] enoxaparin (LOVENOX) injection  40 mg Subcutaneous Q24H  . [MAR Hold] feeding supplement  1 Container Oral TID BM  . feeding supplement  296 mL Oral Once  . [MAR Hold] mouth rinse  15 mL Mouth Rinse BID  . [MAR Hold] mupirocin ointment  1 application Nasal BID  . povidone-iodine  2 application Topical Once  . [MAR Hold] sodium chloride flush  3 mL Intravenous Q12H   PRN Meds: [MAR Hold] acetaminophen **OR** [MAR Hold] acetaminophen, [MAR Hold] ipratropium-albuterol, [MAR Hold] LORazepam, [MAR Hold] ondansetron **OR** [MAR Hold] ondansetron (ZOFRAN) IV, [MAR Hold] oxyCODONE Continuous Infusions: . lactated ringers Stopped (05/03/20 0205)  . lactated ringers 20 mL/hr at 05/03/20 0745  . [MAR Hold] vancomycin Stopped (05/03/20 0305)      LOS: 5 days  Time spent:  Azucena Fallen, DO [facility] 05/03/2020, 8:32 AM  Contact via secure chat.

## 2020-05-03 NOTE — Progress Notes (Signed)
[facility]    Date of Admission:  04/28/2020   Total days of antibiotics 6           ID: Clifford Clark is a 29 y.o. male with disseminated MRSA infection Principal Problem:   Endocarditis Active Problems:   IVDU (intravenous drug user)   Sepsis (HCC)   Thrombocytopenia (HCC)   Hypokalemia   Hyponatremia   MRSA bacteremia   Malnutrition of moderate degree   Threatening behavior   Hand abscess   Elevated BUN    Subjective: Patient have fever up to 101F last night, underwent bilateral hand debridement. OR cx showing GPC in pairs on gram stain thus far. He also complains of back pain near shoulder blades  Medications:  . calcium-vitamin D  2 tablet Oral BID  . Chlorhexidine Gluconate Cloth  6 each Topical Q0600  . docusate sodium  100 mg Oral BID  . enoxaparin (LOVENOX) injection  40 mg Subcutaneous Q24H  . feeding supplement  1 Container Oral TID BM  . fentaNYL      . mouth rinse  15 mL Mouth Rinse BID  . mupirocin ointment  1 application Nasal BID  . sodium chloride flush  3 mL Intravenous Q12H    Objective: Vital signs in last 24 hours: Temp:  [97.4 F (36.3 C)-101.1 F (38.4 C)] 97.4 F (36.3 C) (07/13 1200) Pulse Rate:  [69-141] 109 (07/13 1200) Resp:  [19-70] 37 (07/13 1200) BP: (92-135)/(52-85) 127/75 (07/13 1200) SpO2:  [94 %-100 %] 96 % (07/13 1200) Physical Exam  Constitutional: He is oriented to person, place. He appears disheveled and well-nourished. No distress.  HENT:  Mouth/Throat: Oropharynx is clear and moist. No oropharyngeal exudate.  Cardiovascular: tachycardic, regular rhythm and normal heart sounds. Exam reveals no gallop and no friction rub.  No murmur heard.  Pulmonary/Chest: Effort normal and breath sounds normal. No respiratory distress. He has no wheezes.  Abdominal: Soft. Bowel sounds are normal. He exhibits no distension. There is no tenderness.  Ext: moves extremities independently. Hands/wrist wrapped  bilaterally from surgery Neurological: He is alert and oriented to person, place, and time.  Skin: Skin is warm and dry. No rash noted. No erythema.  Psychiatric: He has a normal mood and affect. His behavior is normal.     Lab Results Recent Labs    05/02/20 0127 05/03/20 0219  WBC 18.5* 13.9*  HGB 9.2* 8.0*  HCT 27.1* 23.8*  NA 128* 131*  K 4.7 4.3  CL 97* 102  CO2 21* 23  BUN 77* 57*  CREATININE 1.21 1.00   Liver Panel Recent Labs    05/02/20 0127 05/03/20 0219  PROT 5.9* 5.3*  ALBUMIN 1.5* 1.4*  AST 40 58*  ALT 28 41  ALKPHOS 92 81  BILITOT 0.6 0.5   Lab Results  Component Value Date   ESRSEDRATE 41 (H) 04/28/2020    Microbiology: 7/10 blood cx ngtd at 72hr 7/13 abscess cx pending Studies/Results: DG CHEST PORT 1 VIEW  Result Date: 05/03/2020 CLINICAL DATA:  Sepsis, shortness of breath EXAM: PORTABLE CHEST 1 VIEW COMPARISON:  Chest x-ray 05/02/2020.  Chest CT 04/28/2020. FINDINGS: Multiple masslike nodular airspace opacities are seen throughout the lungs, stable. Heart is borderline in size. No visible effusions or pneumothorax. No real change since prior study. IMPRESSION: No significant change in numerous masslike airspace opacities. Electronically Signed   By: Charlett Nose M.D.   On: 05/03/2020 08:33   DG CHEST PORT 1 VIEW  Result Date: 05/02/2020 CLINICAL DATA:  29 year old male with increasing shortness of breath. EXAM: PORTABLE CHEST 1 VIEW COMPARISON:  Chest radiograph dated 04/28/2020 and CT dated 04/28/2020 FINDINGS: Interval worsening of bilateral confluent and nodular pulmonary opacities compared to the prior radiograph. There is shallow inspiration. No large pleural effusion or pneumothorax. Stable cardiac silhouette. No acute osseous pathology. IMPRESSION: Interval worsening of the bilateral pulmonary opacities. Electronically Signed   By: Elgie Collard M.D.   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 Now complains of back pain and still has intermittent fever  Leukocytosis slightly improved, maybe dilutional since hgb also down.  Continue with vancomycin. Dose per pharmacy protocol  Recommend mri of thoracic and lumbar spine but need to make sure patient is adequately sedated for procedure  Recommend he undergoes TEE for better evaluation of AV  Pain management with opiate dependence = defer primary team for management  [facility] Cell: [phone] Pager: [phone]  05/03/2020, 3:00 PM

## 2020-05-03 NOTE — Anesthesia Preprocedure Evaluation (Addendum)
Anesthesia Evaluation  Patient identified by MRN, date of birth, ID band Patient awake    Reviewed: Allergy & Precautions, H&P , NPO status , Patient's Chart, lab work & pertinent test results, reviewed documented beta blocker date and time   Airway Mallampati: II  TM Distance: >3 FB Neck ROM: full    Dental no notable dental hx.    Pulmonary asthma , former smoker,    Pulmonary exam normal breath sounds clear to auscultation       Cardiovascular Exercise Tolerance: Good  Rhythm:regular Rate:Normal     Neuro/Psych    GI/Hepatic (+)     substance abuse  IV drug use,   Endo/Other    Renal/GU      Musculoskeletal   Abdominal   Peds  Hematology   Anesthesia Other Findings   Reproductive/Obstetrics                             Anesthesia Physical Anesthesia Plan  ASA: III and emergent  Anesthesia Plan: General   Post-op Pain Management:    Induction: Intravenous  PONV Risk Score and Plan:   Airway Management Planned: LMA  Additional Equipment:   Intra-op Plan:   Post-operative Plan:   Informed Consent: I have reviewed the patients History and Physical, chart, labs and discussed the procedure including the risks, benefits and alternatives for the proposed anesthesia with the patient or authorized representative who has indicated his/her understanding and acceptance.     Dental Advisory Given  Plan Discussed with: CRNA and Anesthesiologist  Anesthesia Plan Comments: ( )      Anesthesia Quick Evaluation

## 2020-05-03 NOTE — Interval H&P Note (Signed)
History and Physical Interval Note:  05/03/2020 7:20 AM  Clifford Clark  has presented today for surgery, with the diagnosis of bilateral hand infection.  The various methods of treatment have been discussed with the patient and family. After consideration of risks, benefits and other options for treatment, the patient has consented to  Procedure(s): BILATERAL IRRIGATION AND DEBRIDEMENT HANDS (Bilateral) as a surgical intervention.  The patient's history has been reviewed, patient examined, no change in status, stable for surgery.  I have reviewed the patient's chart and labs.  Questions were answered to the patient's satisfaction.     [name]  I participated in the care of this patient and agree with the above history, physical and evaluation. I performed a review of the history and a physical exam as detailed   Laqueta Due MD

## 2020-05-03 NOTE — Progress Notes (Signed)
MD notified earlier regarding pain control, increased Oxy 5mg  to 1-2 tabs, from previous 5mg  1 tab q 6 hours. Ativan given at 1137, current order Q4 hours. Patient yelling out and agitated/restless, verbally upset at this RN, reports he is in "extreme pain and needs something now". MD [facility] notified, no new orders received.  Patient has mentioned leaving AMA to "find something to control his pain."

## 2020-05-03 NOTE — Transfer of Care (Signed)
Immediate Anesthesia Transfer of Care Note  Patient: Clifford Clark  Procedure(s) Performed: BILATERAL IRRIGATION AND DEBRIDEMENT HANDS (Bilateral )  Patient Location: PACU  Anesthesia Type:General  Level of Consciousness: awake, drowsy and patient cooperative  Airway & Oxygen Therapy: Patient Spontanous Breathing and Patient connected to face mask oxygen  Post-op Assessment: Report given to RN and Post -op Vital signs reviewed and stable  Post vital signs: Reviewed and stable  Last Vitals:  Vitals Value Taken Time  BP 104/64 05/03/20 1003  Temp    Pulse 114 05/03/20 1005  Resp 39 05/03/20 1005  SpO2 100 % 05/03/20 1005  Vitals shown include unvalidated device data.  Last Pain:  Vitals:   05/03/20 0745  TempSrc:   PainSc: Asleep      Patients Stated Pain Goal: 0 (04/29/20 1222)  Complications: No complications documented.

## 2020-05-03 NOTE — Anesthesia Procedure Notes (Addendum)
Procedure Name: LMA Insertion Date/Time: 05/03/2020 9:07 AM Performed by: Sindy Guadeloupe, CRNA Pre-anesthesia Checklist: Patient identified, Emergency Drugs available, Suction available, Patient being monitored and Timeout performed Patient Re-evaluated:Patient Re-evaluated prior to induction Oxygen Delivery Method: Circle system utilized Preoxygenation: Pre-oxygenation with 100% oxygen Induction Type: IV induction Ventilation: Mask ventilation without difficulty LMA: LMA inserted and LMA with gastric port inserted LMA Size: 4.0 Number of attempts: 1 Tube secured with: Tape Dental Injury: Teeth and Oropharynx as per pre-operative assessment  Comments: Lips dry, cracked and bleeding prior to induction, ointment applied.

## 2020-05-04 ENCOUNTER — Inpatient Hospital Stay (HOSPITAL_COMMUNITY): Payer: Self-pay

## 2020-05-04 ENCOUNTER — Encounter (HOSPITAL_COMMUNITY): Payer: Self-pay | Admitting: Orthopedic Surgery

## 2020-05-04 LAB — COMPREHENSIVE METABOLIC PANEL
ALT: 39 U/L (ref 0–44)
AST: 44 U/L — ABNORMAL HIGH (ref 15–41)
Albumin: 1.4 g/dL — ABNORMAL LOW (ref 3.5–5.0)
Alkaline Phosphatase: 78 U/L (ref 38–126)
Anion gap: 9 (ref 5–15)
BUN: 48 mg/dL — ABNORMAL HIGH (ref 6–20)
CO2: 22 mmol/L (ref 22–32)
Calcium: 7.4 mg/dL — ABNORMAL LOW (ref 8.9–10.3)
Chloride: 104 mmol/L (ref 98–111)
Creatinine, Ser: 0.88 mg/dL (ref 0.61–1.24)
GFR calc Af Amer: >60 mL/min (ref >60)
GFR calc non Af Amer: >60 mL/min (ref >60)
Glucose, Bld: 122 mg/dL — ABNORMAL HIGH (ref 70–99)
Potassium: 4.8 mmol/L (ref 3.5–5.1)
Sodium: 135 mmol/L (ref 135–145)
Total Bilirubin: 0.6 mg/dL (ref 0.3–1.2)
Total Protein: 5.5 g/dL — ABNORMAL LOW (ref 6.5–8.1)

## 2020-05-04 LAB — CBC WITH DIFFERENTIAL/PLATELET
Abs Immature Granulocytes: 0.45 10*3/uL — ABNORMAL HIGH (ref 0.00–0.07)
Basophils Absolute: 0 10*3/uL (ref 0.0–0.1)
Basophils Relative: 0 %
Eosinophils Absolute: 0 10*3/uL (ref 0.0–0.5)
Eosinophils Relative: 0 %
HCT: 22.8 % — ABNORMAL LOW (ref 39.0–52.0)
Hemoglobin: 7.3 g/dL — ABNORMAL LOW (ref 13.0–17.0)
Immature Granulocytes: 4 %
Lymphocytes Relative: 12 %
Lymphs Abs: 1.3 10*3/uL (ref 0.7–4.0)
MCH: 27.1 pg (ref 26.0–34.0)
MCHC: 32 g/dL (ref 30.0–36.0)
MCV: 84.8 fL (ref 80.0–100.0)
Monocytes Absolute: 0.8 10*3/uL (ref 0.1–1.0)
Monocytes Relative: 7 %
Neutro Abs: 8 10*3/uL — ABNORMAL HIGH (ref 1.7–7.7)
Neutrophils Relative %: 77 %
Platelets: 194 10*3/uL (ref 150–400)
RBC: 2.69 MIL/uL — ABNORMAL LOW (ref 4.22–5.81)
RDW: 15 % (ref 11.5–15.5)
WBC: 10.5 10*3/uL (ref 4.0–10.5)
nRBC: 0 % (ref 0.0–0.2)

## 2020-05-04 LAB — MAGNESIUM: Magnesium: 2.3 mg/dL (ref 1.7–2.4)

## 2020-05-04 LAB — VANCOMYCIN, TROUGH: Vancomycin Tr: 11 ug/mL — ABNORMAL LOW (ref 15–20)

## 2020-05-04 MED ORDER — GADOBUTROL 1 MMOL/ML IV SOLN
5.0000 mL | Freq: Once | INTRAVENOUS | Status: AC | PRN
  Administered 2020-05-04: 5 mL via INTRAVENOUS

## 2020-05-04 MED ORDER — SODIUM CHLORIDE 0.9 % IV SOLN: INTRAVENOUS | Status: DC

## 2020-05-04 MED ORDER — VANCOMYCIN HCL IN DEXTROSE 1-5 GM/200ML-% IV SOLN: 1000.0000 mg | Freq: Two times a day (BID) | INTRAVENOUS | Status: DC

## 2020-05-04 MED ORDER — VANCOMYCIN HCL 750 MG/150ML IV SOLN
750.0000 mg | Freq: Three times a day (TID) | INTRAVENOUS | Status: DC
  Administered 2020-05-04 – 2020-05-05 (×4): 750 mg via INTRAVENOUS
  Filled 2020-05-04 (×5): qty 150

## 2020-05-04 NOTE — Progress Notes (Signed)
    [facility] has been requested to perform a transesophageal echocardiogram on Clifford Clark for bactremia.  After careful review of history and examination, the risks and benefits of transesophageal echocardiogram have been explained including risks of esophageal damage, perforation (1:10,000 risk), bleeding, pharyngeal hematoma as well as other potential complications associated with conscious sedation including aspiration, arrhythmia, respiratory failure and death. Alternatives to treatment were discussed, questions were answered. Patient is willing to proceed.   Pt is scheduled tomorrow at 12:30 with Dr. Elease Hashimoto. NPO at [place].  Clifford Clark, [place]  05/04/2020 5:09 PM

## 2020-05-04 NOTE — Progress Notes (Signed)
ID progress note  Afebrile, reports still having pain to wrists  A/P: disseminated MRSA infection, bacteremia, TV endocarditis possible left sided endocarditis s/p IXD of SSTI, cx also growing staph aureus  - continue on vancomycin - will repeat blood cx today - need to get TEE as well as lumbar/thoracic MRI to determine if other nidus of infection  Lucretia Pendley B. Drue Second MD MPH [facility] [phone]

## 2020-05-04 NOTE — Progress Notes (Signed)
Text paged Dr. Natale Milch about patient's HR sustaining in the 140's while at rest. Pt stated, "I'm fine." Does not appear in any distress, able to answer questions. Still awaiting response from MD and will continue to monitor

## 2020-05-04 NOTE — Progress Notes (Signed)
Pharmacy Antibiotic Note  Clifford Clark is a 29 y.o. male admitted on 04/28/2020 with sepsis.  Pharmacy has been consulted for vancomycin dosing.  Pt has a history of IVDU, admitted with sepsis and possible endocarditis.   05/01/20  VT 31 this AM at 0958 = 31 mcg/ml, last dose 7/11 at 0200  SCr 0.8 > 1.01 > 1.38   Held Vancomycin, Per discussion with MD, not quite convinced it is all due to vanc as pt also has septic emboli to kidneys   05/02/20  V random level 0820 = 8, Vanc levels 31 > 8 over 22 hr  SCr decr to 1.0  Vanc resume at 750mg  q12   Today, 05/04/20  V trough 0200 = 11, goal 15-20 mcg/ml  SCr decr to 0.88  Increase Vanc back to 750mg  q8h   Plan:  Adjust Vancomycin back to 750mg  q8  Follow renal function and culture data. Goal VT 15-20 mcg/mL  Daily CMET, last 7/14  Plan six weeks abx  Plan TEE  Height: 5\' 7"  (170.2 cm) Weight: 55 kg (121 lb 4.1 oz) IBW/kg (Calculated) : 66.1  Temp (24hrs), Avg:98.1 F (36.7 C), Min:97.4 F (36.3 C), Max:98.6 F (37 C)  Recent Labs  Lab 04/28/20 1540 04/28/20 1545 04/28/20 1800 04/28/20 2224 04/29/20 0031 04/29/20 0031 04/30/20 0815 05/01/20 0733 05/01/20 0955 05/02/20 0127 05/02/20 0819 05/03/20 0219 05/04/20 0203  WBC   < >  --   --   --  21.2*   < > 18.1* 20.2*  --  18.5*  --  13.9* 10.5  CREATININE   < >  --   --   --  0.81   < > 1.01 1.38*  --  1.21  --  1.00 0.88  LATICACIDVEN  --  2.1* 2.3* 1.7 1.3  --   --   --   --   --   --   --   --   VANCOTROUGH  --   --   --   --   --   --   --   --  31*  --   --   --  11*  VANCORANDOM  --   --   --   --   --   --   --   --   --   --  8  --   --    < > = values in this interval not displayed.    Estimated Creatinine Clearance: 96.4 mL/min (by C-G formula based on SCr of 0.88 mg/dL).    No Known Allergies  Antimicrobials this admission: cefepime 7/8 >> 7/9 vancomycin 7/8 >>  Metronidazole 7/8 >> 7/9   Dose adjustments this admission: 7/9 Received  vanc 1000 mg x 2 before weight updated, adjusted to 750 mg IV q8h  7/11 VT 31 mcg/ml - hold today's doses and get another level with AM labs  7/12: V random 0820 = 8, prev 31 > 8 over 22 hr adjust Vanc to 750mg  q12 7/14: 0200 V trough = 11 on 750mg  q12, SCr to 0.88, adj Vanc back to 750mg  q8  Microbiology results: 7/8 BCx: BCID resulted 4/4 MRSA, BCx resulting as MSSA 7/8 UCx:  >100,000 colonies MSSA  7/8 MRSA PCR: positive  7/8 COVID-19 negative  7/8 HIV:  negative  7/8 hepatitis panel: hep C Ab positive 7/9: HCV RNA quant: not elevated  7/10 rpt BCx: NGTD  7/13 R WristCx & FungalCx: rare Staph aureus/sent 7/13 L WristCx &  FungalCx: few Staph aureus/sent  Thank you for allowing pharmacy to be a part of this patient's care.  Otho Bellows PharmD 05/04/2020, 10:47 AM

## 2020-05-04 NOTE — Progress Notes (Signed)
    Subjective: Patient reports pain as moderate to severe.  Tolerating diet. Denies  CP, SOB.    Objective:   VITALS:   Vitals:   05/04/20 1200 05/04/20 1223 05/04/20 1300 05/04/20 1400  BP:   (!) 136/93 (!) 149/93  Pulse:   (!) 130 (!) 138  Resp:   (!) 39 (!) 49  Temp: 98.1 F (36.7 C)     TempSrc: Oral     SpO2:  93%    Weight:      Height:       CBC Latest Ref Rng & Units 05/04/2020 05/03/2020 05/02/2020  WBC 4.0 - 10.5 K/uL 10.5 13.9(H) 18.5(H)  Hemoglobin 13.0 - 17.0 g/dL 7.3(L) 8.0(L) 9.2(L)  Hematocrit 39 - 52 % 22.8(L) 23.8(L) 27.1(L)  Platelets 150 - 400 K/uL 194 171 140(L)   BMP Latest Ref Rng & Units 05/04/2020 05/03/2020 05/02/2020  Glucose 70 - 99 mg/dL 989(Q) 119(E) 174(Y)  BUN 6 - 20 mg/dL 81(K) 48(J) 85(U)  Creatinine 0.61 - 1.24 mg/dL 3.14 9.70 2.63  Sodium 135 - 145 mmol/L 135 131(L) 128(L)  Potassium 3.5 - 5.1 mmol/L 4.8 4.3 4.7  Chloride 98 - 111 mmol/L 104 102 97(L)  CO2 22 - 32 mmol/L 22 23 21(L)  Calcium 8.9 - 10.3 mg/dL 7.4(L) 7.0(L) 7.3(L)   Intake/Output      07/13 0701 - 07/14 0700 07/14 0701 - 07/15 0700   P.O.  920   I.V. (mL/kg) 2022.8 (36.8) 1360.3 (24.7)   IV Piggyback 358.1 150   Total Intake(mL/kg) 2380.9 (43.3) 2430.3 (44.2)   Urine (mL/kg/hr) 1050 (0.8) 900 (2.1)   Stool     Blood 10    Total Output 1060 900   Net +1320.9 +1530.3        Urine Occurrence 1 x 1 x      Physical Exam: General: NAD.  Resting until exam Resp: No increased wob Cardio: regular rate and rhythm ABD soft Neurologically intact MSK Neurovascularly intact Sensation intact distally Intact pulses distally Incision: dressing C/D/I left hand no obious swelling, no drainage. Right hand moderate swelling dorsal hand distal to ace wrap. no drainage,  Assessment: 1 Day Post-Op  S/P Procedure(s) (LRB): BILATERAL IRRIGATION AND DEBRIDEMENT HANDS (Bilateral) by Dr. Jewel Baize. Eulah Pont on 05/03/20  Principal Problem:   Endocarditis Active Problems:   IVDU  (intravenous drug user)   Sepsis (HCC)   Thrombocytopenia (HCC)   Hypokalemia   Hyponatremia   MRSA bacteremia   Malnutrition of moderate degree   Threatening behavior   Hand abscess   Elevated BUN  ADDITIONAL DIAGNOSIS:   none     Plan: Continue dressings, reinforce right hand with ace wrap distally to relieve pressure on dorsal swelling.  Continue antibiotics per ID Incentive Spirometry Elevate and Apply ice  Weightbearing: NWB upper extremities Insicional and dressing care: Dressings left intact until follow-up reinforce as needed Orthopedic device(s): none currently Showering: Keep dressing dry VTE prophylaxis: as per medicine as per medicine, SCDs, ambulation Pain control:  Follow - up plan: will follow up as needed, will change dressing next week if still in hospital Contact information:  Margarita Rana MD, Skip Mayer PA-C  Dispo: follow up in 1 week post operative in hospital or at office     Skip Mayer, PA-C [phone] 05/04/2020, 2:48 PMPatient ID: Clifford Clark, male   DOB: Feb 08, 1991, 29 y.o.   MRN: [id number]

## 2020-05-04 NOTE — Progress Notes (Signed)
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 homeless, asthma who presented to the ER after being found on the ground sleeping by a friend he stated.  EMS was called and he was transported to the hospital. In the ER with multiple imaging studies including x-ray of his right hand and wrist, x-ray of left hand, CXR, CT chest/abdomen/pelvis, CT C-spine, and CT head.  There were no acute fractures in his bilateral upper extremities.  CT head also unremarkable.  Remainder of his imaging studies revealed what appeared to be septic pulmonary emboli as well as septic emboli involving both kidneys. Cultures were drawn and he was started on vancomycin, cefepime, and Flagyl in the ER - 4/4 blood cultures were growing MRSA. TTE also performed on 7/9 revealing TV vegetation. Became verbally abusive/threatening 05/01/20 and Psych was consulted and a sitter was requested.  7/12 Patient developed worsening swelling in his right wrist and left hand. There was concern for septic joint or bursitis. A CT was ordered bilaterally which did show bilateral abscesses.  7/13 Bilateral I&D complaining of pain - admits to taking Carfentanyl at home/off the street.    Interval History/ROS:  Continues to complain of uncontrolled pain -indicates his pain is 5 or 6 out of 10, we discussed that he should not be expecting to be completely pain-free given his history of narcotic abuse and postoperative status.  Patient otherwise denies nausea, vomiting, diarrhea, headache, fevers, chills.  Reports some constipation, has not had bowel movement in a "sometime" but no definitive timeline.     Assessment & Plan: Principal Problem:   Endocarditis Active Problems:   IVDU (intravenous drug user)   Sepsis (HCC)    Thrombocytopenia (HCC)   Hypokalemia   Hyponatremia   MRSA bacteremia   Malnutrition of moderate degree   Threatening behavior   Hand abscess   Elevated BUN   MRSA bacteremia with concurrent endocarditis in the setting of IV drug abuse - TTE performed on 04/29/20: TV vegetation (14mm) noted as well as possible veg on PV - TEE pending cardiology evaluation - MRI T/L spine pending -ID following - appreciate insight/recommendations - will need minimum 6 weeks IV vanc - trend BC until clear (7/10 cultures remain NGTD) prior to PICC placement; patient will need to remain in hospital for duration of treatment course; no indication for IVC if tries to leave AMA - he is high risk for leaving AMA and for worsening infection and/or death if he does; called and spoke with his mom [name] on 04/29/20 and updated her as well as told her all this as well  Hand abscess s/p I&D - Likely secondary to above - Bilateral abscesses on hands, s/p CT on 7/11 - Appreciate hand surgery consult; I&D on 7/13  Sepsis (HCC) secondary to above - See MRSA bacteremia  Acute metabolic encephalopathy, resolved - Considered due to IV drugs as well as sepsis/infection - Mentation has cleared and diet started   Thrombocytopenia (HCC) - Likely due to acute illness/sepsis; cannot rule out other etiologies yet - Trend CBC with infection treatment; slowly improving   Hyponatremia - Replete and recheck as needed  Elevated LFTs - Possibly due to current infection/sepsis vs risk for hepatitis from IV drug use -  Hepatitis panel noted for hep C Ab positive - Check HCV Quant: Low/undetectable - LFTs improving with infection treatment  Hypokalemia -Replete and recheck as needed  Malnutrition of moderate degree Per RD: "Moderate Malnutrition related to social / environmental circumstances as evidenced by energy intake < 75% for > or equal to 3 months, moderate fat depletion, moderate muscle depletion, percent weight  loss." - appreciate assistance - diet advanced on 7/10  Threatening behavior - on 7/11, patient endorsed multiple homicidal ideations to nurse and CNA in room; he threatened that he was going "to kill the attending" and all their family and kids; this was voiced several times despite nursing attempting to re-direct patient - psych eval requested at that time - no evidence of imminent risk to self/others  AKI, Resolved - BUN now downtrending appropriately; creatinine stable - Continue IVF - LR @ 75 Lab Results  Component Value Date   CREATININE 0.88 05/04/2020   CREATININE 1.00 05/03/2020   CREATININE 1.21 05/02/2020     Antimicrobials: Vanc 04/28/20 >> present Cefepime 04/28/20 - 04/29/20 Flagyl 04/28/20 - 04/29/20  DVT prophylaxis: Lovenox Code Status: Full Family Communication: Mother Disposition Plan:  Status is: Inpatient  Remains inpatient appropriate because:Ongoing active pain requiring inpatient pain management, Unsafe d/c plan, IV treatments appropriate due to intensity of illness or inability to take PO and Inpatient level of care appropriate due to severity of illness   Dispo: The patient is from: Home              Anticipated d/c is to: Home              Anticipated d/c date is: > 3 days              Patient currently is not medically stable to d/c.  Objective: Blood pressure 121/78, pulse (!) 109, temperature 98.2 F (36.8 C), temperature source Oral, resp. rate (!) 29, height 5\' 7"  (1.702 m), weight 55 kg, SpO2 93 %.   Intake/Output Summary (Last 24 hours) at 05/04/2020 0723 Last data filed at 05/04/2020 0000 Gross per 24 hour  Intake 2231.42 ml  Output 1060 ml  Net 1171.42 ml   Last Weight  Most recent update: 04/28/2020  9:43 PM   Weight  55 kg (121 lb 4.1 oz)           Examination: General:  Pleasantly resting in bed, No acute distress. HEENT:  Normocephalic atraumatic.  Sclerae nonicteric, noninjected.  Extraocular movements intact bilaterally. Neck:   Without mass or deformity.  Trachea is midline. Lungs:  Clear to auscultate bilaterally without rhonchi, wheeze, or rales. Heart:  Regular rate and rhythm.  Without murmurs, rubs, or gallops. Abdomen:  Soft, nontender, nondistended.  Without guarding or rebound. Extremities/skin: Do not apprecaite any splinter hemorrhages or Osler notes/Janeway; bilateral wrists bandages clean dry intact  Neurologic: Grossly normal   Consultants:   ID  Hand surgery  Procedures:   TTE 04/29/2020  Data Reviewed: I have personally reviewed following labs and imaging studies Results for orders placed or performed during the hospital encounter of 04/28/20 (from the past 24 hour(s))  Aerobic/Anaerobic Culture (surgical/deep wound)     Status: None (Preliminary result)   Collection Time: 05/03/20  9:35 AM   Specimen: Soft Tissue, Other  Result Value Ref Range   Specimen Description      TISSUE RT WRIST Performed at [facility], [address], [place], [place] [address]    Special Requests  PODIATRY ANCEF, VANC Performed at [facility], [address], [place], [place] [address]    Gram Stain      RARE WBC PRESENT,BOTH PMN AND MONONUCLEAR RARE GRAM POSITIVE COCCI IN PAIRS IN CLUSTERS Performed at [facility], [address] [place], [place] [address]    Culture PENDING    Report Status PENDING   Aerobic/Anaerobic Culture (surgical/deep wound)     Status: None (Preliminary result)   Collection Time: 05/03/20  9:38 AM   Specimen: Soft Tissue, Other  Result Value Ref Range   Specimen Description      TISSUE LT HAND Performed at [facility], [address], [place], [place] [address]    Special Requests      PATIENT ON FOLLOWING ANCEF, [facility] Performed at [facility], [address], [place], [place] [address]    Gram Stain      NO WBC SEEN RARE GRAM POSITIVE COCCI IN PAIRS IN CLUSTERS Performed at [facility], [address], [place], [place] [address]    Culture PENDING    Report Status PENDING   Vancomycin, trough     Status: Abnormal   Collection Time: 05/04/20  2:03 AM  Result Value Ref Range   Vancomycin Tr 11 (L) 15 - 20 ug/mL  CBC with Differential/Platelet     Status: Abnormal   Collection Time: 05/04/20  2:03 AM  Result Value Ref Range   WBC 10.5 4.0 - 10.5 K/uL   RBC 2.69 (L) 4.22 - 5.81 MIL/uL   Hemoglobin 7.3 (L) 13.0 - 17.0 g/dL   HCT 84.6 (L) 39 - 52 %   MCV 84.8 80.0 - 100.0 fL   MCH 27.1 26.0 - 34.0 pg   MCHC 32.0 30.0 - 36.0 g/dL   RDW 96.2 95.2 - 84.1 %   Platelets 194 150 - 400 K/uL   nRBC 0.0 0.0 - 0.2 %   Neutrophils Relative % 77 %   Neutro Abs 8.0 (H) 1.7 - 7.7 K/uL   Lymphocytes Relative 12 %   Lymphs Abs 1.3 0.7 - 4.0 K/uL   Monocytes Relative 7 %   Monocytes Absolute 0.8 0 - 1 K/uL   Eosinophils Relative 0 %   Eosinophils Absolute 0.0 0 - 0 K/uL   Basophils Relative 0 %   Basophils Absolute 0.0 0 - 0 K/uL   Immature Granulocytes 4 %   Abs Immature Granulocytes 0.45 (H) 0.00 - 0.07 K/uL  Comprehensive metabolic panel     Status: Abnormal   Collection Time: 05/04/20  2:03 AM  Result Value Ref Range   Sodium 135 135 - 145 mmol/L   Potassium 4.8 3.5 - 5.1 mmol/L   Chloride 104 98 - 111 mmol/L   CO2 22 22 - 32 mmol/L   Glucose, Bld 122 (H) 70 - 99 mg/dL   BUN 48 (H) 6 - 20 mg/dL   Creatinine, Ser 3.24 0.61 - 1.24 mg/dL   Calcium 7.4 (L) 8.9 - 10.3 mg/dL   Total Protein 5.5 (L) 6.5 - 8.1 g/dL   Albumin 1.4 (L) 3.5 - 5.0 g/dL   AST 44 (H) 15 - 41 U/L   ALT 39 0 - 44 U/L   Alkaline Phosphatase 78 38 - 126 U/L   Total Bilirubin 0.6 0.3 - 1.2 mg/dL   GFR calc non Af Amer >60 >60 mL/min   GFR calc Af Amer >60 >60 mL/min   Anion gap 9 5 -  15  Magnesium     Status: None   Collection Time: 05/04/20  2:03 AM  Result Value Ref Range   Magnesium 2.3 1.7 - 2.4 mg/dL    Recent Results (from the past 240 hour(s))  Culture, blood (routine x 2)      Status: Abnormal   Collection Time: 04/28/20  3:45 PM   Specimen: BLOOD  Result Value Ref Range Status   Specimen Description   Final    BLOOD LEFT ANTECUBITAL Performed at [facility], [address] [place], [place] [address]    Special Requests   Final    BOTTLES DRAWN AEROBIC AND ANAEROBIC Blood Culture adequate volume Performed at [facility], [address] [place], [place] [address]    Culture  Setup Time   Final    GRAM POSITIVE COCCI IN BOTH AEROBIC AND ANAEROBIC BOTTLES CRITICAL RESULT CALLED TO, READ BACK BY AND VERIFIED WITH: PHARMD M. Derrick RavelENZ [phone] FCP    Culture (A)  Final    STAPHYLOCOCCUS AUREUS METHICILLIN RESISTANT ORGANISM WAS NOT RECOVERED IN CULTURE. REPEATED FOR CONFIRMATION Performed at [facility], [address] [place], [place] [address]    Report Status 05/03/2020 FINAL  Final   Organism ID, Bacteria STAPHYLOCOCCUS AUREUS  Final      Susceptibility   Staphylococcus aureus - MIC*    CIPROFLOXACIN <=0.5 SENSITIVE Sensitive     ERYTHROMYCIN >=8 RESISTANT Resistant     GENTAMICIN <=0.5 SENSITIVE Sensitive     OXACILLIN 0.5 SENSITIVE Sensitive     TETRACYCLINE <=1 SENSITIVE Sensitive     VANCOMYCIN <=0.5 SENSITIVE Sensitive     TRIMETH/SULFA <=10 SENSITIVE Sensitive     CLINDAMYCIN <=0.25 SENSITIVE Sensitive     RIFAMPIN <=0.5 SENSITIVE Sensitive     Inducible Clindamycin NEGATIVE Sensitive     * STAPHYLOCOCCUS AUREUS  Blood Culture ID Panel (Reflexed)     Status: Abnormal   Collection Time: 04/28/20  3:45 PM  Result Value Ref Range Status   Enterococcus species NOT DETECTED NOT DETECTED Final   Listeria monocytogenes NOT DETECTED NOT DETECTED Final   Staphylococcus species DETECTED (A) NOT DETECTED Final    Comment: CRITICAL RESULT CALLED TO, READ BACK BY AND VERIFIED WITH: PHARMD Lynder ParentsM. RENZ [phone] FCP    Staphylococcus aureus (BCID) DETECTED (A) NOT DETECTED Final    Comment: Methicillin  (oxacillin)-resistant Staphylococcus aureus (MRSA). MRSA is predictably resistant to beta-lactam antibiotics (except ceftaroline). Preferred therapy is vancomycin unless clinically contraindicated. Patient requires contact precautions if  hospitalized. CRITICAL RESULT CALLED TO, READ BACK BY AND VERIFIED WITH: PHARMD M. Derrick RavelENZ [phone] FCP    Methicillin resistance DETECTED (A) NOT DETECTED Final    Comment: CRITICAL RESULT CALLED TO, READ BACK BY AND VERIFIED WITH: PHARMD M. RENZ [phone] FCP    Streptococcus species NOT DETECTED NOT DETECTED Final   Streptococcus agalactiae NOT DETECTED NOT DETECTED Final   Streptococcus pneumoniae NOT DETECTED NOT DETECTED Final   Streptococcus pyogenes NOT DETECTED NOT DETECTED Final   Acinetobacter baumannii NOT DETECTED NOT DETECTED Final   Enterobacteriaceae species NOT DETECTED NOT DETECTED Final   Enterobacter cloacae complex NOT DETECTED NOT DETECTED Final   Escherichia coli NOT DETECTED NOT DETECTED Final   Klebsiella oxytoca NOT DETECTED NOT DETECTED Final   Klebsiella pneumoniae NOT DETECTED NOT DETECTED Final   Proteus species NOT DETECTED NOT DETECTED Final   Serratia marcescens NOT DETECTED NOT DETECTED Final   Haemophilus influenzae NOT DETECTED NOT  DETECTED Final   Neisseria meningitidis NOT DETECTED NOT DETECTED Final   Pseudomonas aeruginosa NOT DETECTED NOT DETECTED Final   Candida albicans NOT DETECTED NOT DETECTED Final   Candida glabrata NOT DETECTED NOT DETECTED Final   Candida krusei NOT DETECTED NOT DETECTED Final   Candida parapsilosis NOT DETECTED NOT DETECTED Final   Candida tropicalis NOT DETECTED NOT DETECTED Final    Comment: Performed at [facility], [address] [place], [place] [address]  Culture, blood (routine x 2)     Status: Abnormal   Collection Time: 04/28/20  4:13 PM   Specimen: BLOOD  Result Value Ref Range Status   Specimen Description   Final    BLOOD RIGHT ANTECUBITAL Performed at [facility], [address], [place], [place] [address]    Special Requests   Final    BOTTLES DRAWN AEROBIC AND ANAEROBIC Blood Culture adequate volume Performed at [facility], [address] [place], [place] [address]    Culture  Setup Time   Final    GRAM POSITIVE COCCI IN BOTH AEROBIC AND ANAEROBIC BOTTLES CRITICAL VALUE NOTED.  VALUE IS CONSISTENT WITH PREVIOUSLY REPORTED AND CALLED VALUE.    Culture (A)  Final    STAPHYLOCOCCUS AUREUS METHICILLIN RESISTANT ORGANISM WAS NOT RECOVERED IN CULTURE. REPEATED FOR CONFIRMATION Performed at [facility], [address] [place], [place] [address]    Report Status 05/03/2020 FINAL  Final   Organism ID, Bacteria STAPHYLOCOCCUS AUREUS  Final      Susceptibility   Staphylococcus aureus - MIC*    CIPROFLOXACIN <=0.5 SENSITIVE Sensitive     ERYTHROMYCIN >=8 RESISTANT Resistant     GENTAMICIN <=0.5 SENSITIVE Sensitive     OXACILLIN 0.5 SENSITIVE Sensitive     TETRACYCLINE <=1 SENSITIVE Sensitive     VANCOMYCIN <=0.5 SENSITIVE Sensitive     TRIMETH/SULFA <=10 SENSITIVE Sensitive     CLINDAMYCIN <=0.25 SENSITIVE Sensitive     RIFAMPIN <=0.5 SENSITIVE Sensitive     Inducible Clindamycin NEGATIVE Sensitive     * STAPHYLOCOCCUS AUREUS  Urine culture     Status: Abnormal   Collection Time: 04/28/20  7:00 PM   Specimen: In/Out Cath Urine  Result Value Ref Range Status   Specimen Description   Final    IN/OUT CATH URINE Performed at [facility], [address], [place], [place] [address]    Special Requests   Final    NONE Performed at [facility], [address] [place], [place] [address]    Culture >=100,000 COLONIES/mL STAPHYLOCOCCUS AUREUS (A)  Final   Report Status 05/01/2020 FINAL  Final   Organism ID, Bacteria STAPHYLOCOCCUS AUREUS (A)  Final      Susceptibility   Staphylococcus aureus - MIC*    CIPROFLOXACIN <=0.5 SENSITIVE Sensitive      GENTAMICIN <=0.5 SENSITIVE Sensitive     NITROFURANTOIN <=16 SENSITIVE Sensitive     OXACILLIN 0.5 SENSITIVE Sensitive     TETRACYCLINE <=1 SENSITIVE Sensitive     VANCOMYCIN <=0.5 SENSITIVE Sensitive     TRIMETH/SULFA <=10 SENSITIVE Sensitive     CLINDAMYCIN <=0.25 SENSITIVE Sensitive     RIFAMPIN <=0.5 SENSITIVE Sensitive     Inducible Clindamycin NEGATIVE Sensitive     * >=100,000 COLONIES/mL STAPHYLOCOCCUS AUREUS  SARS Coronavirus 2 by RT PCR (hospital order, performed in [facility] hospital lab) Nasopharyngeal Nasopharyngeal Swab     Status: None   Collection Time: 04/28/20  7:03  PM   Specimen: Nasopharyngeal Swab  Result Value Ref Range Status   SARS Coronavirus 2 NEGATIVE NEGATIVE Final    Comment: (NOTE) SARS-CoV-2 target nucleic acids are NOT DETECTED.  The SARS-CoV-2 RNA is generally detectable in upper and lower respiratory specimens during the acute phase of infection. The lowest concentration of SARS-CoV-2 viral copies this assay can detect is 250 copies / mL. A negative result does not preclude SARS-CoV-2 infection and should not be used as the sole basis for treatment or other patient management decisions.  A negative result may occur with improper specimen collection / handling, submission of specimen other than nasopharyngeal swab, presence of viral mutation(s) within the areas targeted by this assay, and inadequate number of viral copies (<250 copies / mL). A negative result must be combined with clinical observations, patient history, and epidemiological information.  Fact Sheet for Patients:   BoilerBrush.com.cy  Fact Sheet for Healthcare Providers: https://pope.com/  This test is not yet approved or  cleared by the [place] FDA and has been authorized for detection and/or diagnosis of SARS-CoV-2 by FDA under an Emergency Use Authorization (EUA).  This EUA will remain in effect (meaning this test can be  used) for the duration of the COVID-19 declaration under Section 564(b)(1) of the Act, 21 U.S.C. section 360bbb-3(b)(1), unless the authorization is terminated or revoked sooner.  Performed at [facility], [address], [place], [place] [address]   MRSA PCR Screening     Status: Abnormal   Collection Time: 04/28/20  7:45 PM   Specimen: Nasal Mucosa; Nasopharyngeal  Result Value Ref Range Status   MRSA by PCR POSITIVE (A) NEGATIVE Final    Comment:        The GeneXpert MRSA Assay (FDA approved for NASAL specimens only), is one component of a comprehensive MRSA colonization surveillance program. It is not intended to diagnose MRSA infection nor to guide or monitor treatment for MRSA infections. RESULT CALLED TO, READ BACK BY AND VERIFIED WITH: S SMITH AT 2314 ON 04/28/2020 BY MOSLEY,J  Performed at [facility], [address], [place], [place] [address]   Culture, blood (Routine X 2) w Reflex to ID Panel     Status: None (Preliminary result)   Collection Time: 04/30/20  8:15 AM   Specimen: BLOOD  Result Value Ref Range Status   Specimen Description   Final    BLOOD LEFT ANTECUBITAL Performed at [facility], [address] [place], [place] [address]    Special Requests   Final    BOTTLES DRAWN AEROBIC AND ANAEROBIC Blood Culture adequate volume Performed at [facility], [address], [place], [place] [address]    Culture   Final    NO GROWTH 3 DAYS Performed at [facility], [address] [place], [place] [address]    Report Status PENDING  Incomplete  Culture, blood (Routine X 2) w Reflex to ID Panel     Status: None (Preliminary result)   Collection Time: 04/30/20  8:15 AM   Specimen: BLOOD LEFT FOREARM  Result Value Ref Range Status   Specimen Description   Final    BLOOD LEFT FOREARM Performed at [facility], [address], [place], [place] [address]    Special  Requests   Final    BOTTLES DRAWN AEROBIC ONLY Blood Culture adequate volume Performed at [facility], [address], [place], [place] [address]    Culture  Final    NO GROWTH 3 DAYS Performed at [facility], [address] [place], [place] [address]    Report Status PENDING  Incomplete  Aerobic/Anaerobic Culture (surgical/deep wound)     Status: None (Preliminary result)   Collection Time: 05/03/20  9:35 AM   Specimen: Soft Tissue, Other  Result Value Ref Range Status   Specimen Description   Final    TISSUE RT WRIST Performed at [facility], [address], [place], [place] [address]    Special Requests   Final    PODIATRY [place] Moos Performed at [facility], [address], [place], [place] [address]    Gram Stain   Final    RARE WBC PRESENT,BOTH PMN AND MONONUCLEAR RARE GRAM POSITIVE COCCI IN PAIRS IN CLUSTERS Performed at [facility], [address] [place], [place] [address]    Culture PENDING  Incomplete   Report Status PENDING  Incomplete  Aerobic/Anaerobic Culture (surgical/deep wound)     Status: None (Preliminary result)   Collection Time: 05/03/20  9:38 AM   Specimen: Soft Tissue, Other  Result Value Ref Range Status   Specimen Description   Final    TISSUE LT HAND Performed at [facility], [address], [place], [place] [address]    Special Requests   Final    PATIENT ON FOLLOWING ANCEF, [facility] Performed at [facility], [address], [place], [place] [address]    Gram Stain   Final    NO WBC SEEN RARE GRAM POSITIVE COCCI IN PAIRS IN CLUSTERS Performed at [facility], [address] [place], [place] [address]    Culture PENDING  Incomplete   Report Status PENDING  Incomplete     Radiology Studies: DG CHEST PORT 1 VIEW  Result Date: 05/03/2020 CLINICAL DATA:  Sepsis, shortness of breath EXAM: PORTABLE CHEST 1 VIEW COMPARISON:   Chest x-ray 05/02/2020.  Chest CT 04/28/2020. FINDINGS: Multiple masslike nodular airspace opacities are seen throughout the lungs, stable. Heart is borderline in size. No visible effusions or pneumothorax. No real change since prior study. IMPRESSION: No significant change in numerous masslike airspace opacities. Electronically Signed   By: Charlett Nose M.D.   On: 05/03/2020 08:33   DG CHEST PORT 1 VIEW  Result Date: 05/02/2020 CLINICAL DATA:  28 year old male with increasing shortness of breath. EXAM: PORTABLE CHEST 1 VIEW COMPARISON:  Chest radiograph dated 04/28/2020 and CT dated 04/28/2020 FINDINGS: Interval worsening of bilateral confluent and nodular pulmonary opacities compared to the prior radiograph. There is shallow inspiration. No large pleural effusion or pneumothorax. Stable cardiac silhouette. No acute osseous pathology. IMPRESSION: Interval worsening of the bilateral pulmonary opacities. Electronically Signed   By: Elgie Collard M.D.   On: 05/02/2020 22:00   DG CHEST PORT 1 VIEW  Final Result    DG CHEST PORT 1 VIEW  Final Result    CT WRIST RIGHT W CONTRAST  Final Result    CT HAND LEFT W CONTRAST  Final Result    US Abdomen Limited RUQ  Final Result    CT Abdomen Pelvis W Contrast  Final Result    CT Head Wo Contrast  Final Result    CT Cervical Spine Wo Contrast  Final Result    CT Chest W Contrast  Final Result    DG Chest Port 1 View  Final Result    DG Wrist Complete Right  Final Result  DG Hand Complete Right  Final Result    DG Hand Complete Left  Final Result       Scheduled Meds: . calcium-vitamin D  2 tablet Oral BID  . Chlorhexidine Gluconate Cloth  6 each Topical Q0600  . docusate sodium  100 mg Oral BID  . enoxaparin (LOVENOX) injection  40 mg Subcutaneous Q24H  . feeding supplement  1 Container Oral TID BM  . mouth rinse  15 mL Mouth Rinse BID  . sodium chloride flush  3 mL Intravenous Q12H   PRN Meds: acetaminophen **OR**  acetaminophen, ipratropium-albuterol, LORazepam, morphine injection, ondansetron **OR** ondansetron (ZOFRAN) IV, oxyCODONE Continuous Infusions: . lactated ringers 125 mL/hr at 05/04/20 0009  . vancomycin Stopped (05/04/20 0303)    LOS: 6 days  Time spent:  Azucena Fallen, DO [facility] 05/04/2020, 7:23 AM  Contact via secure chat.

## 2020-05-05 ENCOUNTER — Other Ambulatory Visit (HOSPITAL_COMMUNITY): Payer: Self-pay

## 2020-05-05 LAB — COMPREHENSIVE METABOLIC PANEL
ALT: 37 U/L (ref 0–44)
AST: 37 U/L (ref 15–41)
Albumin: 1.5 g/dL — ABNORMAL LOW (ref 3.5–5.0)
Alkaline Phosphatase: 69 U/L (ref 38–126)
Anion gap: 6 (ref 5–15)
BUN: 24 mg/dL — ABNORMAL HIGH (ref 6–20)
CO2: 24 mmol/L (ref 22–32)
Calcium: 7.9 mg/dL — ABNORMAL LOW (ref 8.9–10.3)
Chloride: 104 mmol/L (ref 98–111)
Creatinine, Ser: 0.73 mg/dL (ref 0.61–1.24)
GFR calc Af Amer: >60 mL/min (ref >60)
GFR calc non Af Amer: >60 mL/min (ref >60)
Glucose, Bld: 124 mg/dL — ABNORMAL HIGH (ref 70–99)
Potassium: 4.5 mmol/L (ref 3.5–5.1)
Sodium: 134 mmol/L — ABNORMAL LOW (ref 135–145)
Total Bilirubin: 0.6 mg/dL (ref 0.3–1.2)
Total Protein: 5.3 g/dL — ABNORMAL LOW (ref 6.5–8.1)

## 2020-05-05 LAB — CBC
HCT: 23 % — ABNORMAL LOW (ref 39.0–52.0)
Hemoglobin: 7.4 g/dL — ABNORMAL LOW (ref 13.0–17.0)
MCH: 27.6 pg (ref 26.0–34.0)
MCHC: 32.2 g/dL (ref 30.0–36.0)
MCV: 85.8 fL (ref 80.0–100.0)
Platelets: 221 10*3/uL (ref 150–400)
RBC: 2.68 MIL/uL — ABNORMAL LOW (ref 4.22–5.81)
RDW: 15.1 % (ref 11.5–15.5)
WBC: 16.5 10*3/uL — ABNORMAL HIGH (ref 4.0–10.5)
nRBC: 0 % (ref 0.0–0.2)

## 2020-05-05 LAB — CULTURE, BLOOD (ROUTINE X 2)
Culture: NO GROWTH
Culture: NO GROWTH
Special Requests: ADEQUATE
Special Requests: ADEQUATE

## 2020-05-05 MED ORDER — ADULT MULTIVITAMIN W/MINERALS CH
1.0000 | ORAL_TABLET | Freq: Every day | ORAL | Status: DC
  Administered 2020-05-05 – 2020-05-13 (×9): 1 via ORAL
  Filled 2020-05-05 (×9): qty 1

## 2020-05-05 MED ORDER — CEFAZOLIN SODIUM-DEXTROSE 2-4 GM/100ML-% IV SOLN
2.0000 g | Freq: Three times a day (TID) | INTRAVENOUS | Status: DC
  Administered 2020-05-05 – 2020-05-12 (×22): 2 g via INTRAVENOUS
  Filled 2020-05-05 (×26): qty 100

## 2020-05-05 MED ORDER — ENSURE MAX PROTEIN PO LIQD
11.0000 [oz_av] | Freq: Every day | ORAL | Status: DC
  Administered 2020-05-05 – 2020-05-13 (×6): 11 [oz_av] via ORAL
  Filled 2020-05-05 (×12): qty 330

## 2020-05-05 NOTE — Progress Notes (Signed)
[facility]    Date of Admission:  04/28/2020   Total days of antibiotics 8/day 7           ID: Clifford Clark is a 29 y.o. 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 Ate overnight and unable to get TEE this morning. His MRI ruled out discitis  Medications:  . Chlorhexidine Gluconate Cloth  6 each Topical Q0600  . docusate sodium  100 mg Oral BID  . enoxaparin (LOVENOX) injection  40 mg Subcutaneous Q24H  . mouth rinse  15 mL Mouth Rinse BID  . multivitamin with minerals  1 tablet Oral Daily  . Ensure Max Protein  11 oz Oral Daily  . sodium chloride flush  3 mL Intravenous Q12H    Objective: Vital signs in last 24 hours: Temp:  [97.4 F (36.3 C)-99.1 F (37.3 C)] 98.5 F (36.9 C) (07/15 1200) Pulse Rate:  [119-147] 124 (07/15 1400) Resp:  [25-52] 25 (07/15 1400) BP: (130-144)/(69-102) 131/92 (07/15 1400) SpO2:  [92 %-94 %] 93 % (07/15 1400) Physical Exam  Constitutional: He is oriented to person, place, and sleepy. He appears well-developed and well-nourished. No distress.  HENT: abrasion to chin healing eschar Mouth/Throat: Oropharynx is clear and moist. No oropharyngeal exudate.  Cardiovascular: tachycardia, regular rhythm and normal heart sounds. Exam reveals no gallop and no friction rub.  No murmur heard.  Pulmonary/Chest: Effort normal and breath sounds normal. No respiratory distress. He has no wheezes.  Abdominal: Soft. Bowel sounds are normal. He exhibits no distension. There is no tenderness.  Lymphadenopathy:  He has no cervical adenopathy.  Neurological: He is alert and oriented to person, place, and time.    Skin: Skin is warm and dry. No rash noted. No erythema.  Psychiatric: He has a normal mood and affect. His behavior is normal.     Lab Results Recent Labs    05/04/20 0203 05/05/20 0217  WBC 10.5 16.5*  HGB 7.3* 7.4*  HCT 22.8* 23.0*  NA 135 134*  K 4.8 4.5  CL 104 104  CO2 22 24  BUN 48* 24*  CREATININE 0.88 0.73   Liver Panel Recent Labs    05/04/20 0203 05/05/20 0217  PROT 5.5* 5.3*  ALBUMIN 1.4* 1.5*  AST 44* 37  ALT 39 37  ALKPHOS 78 69  BILITOT 0.6 0.6   Lab Results  Component Value Date   ESRSEDRATE 41 (H) 04/28/2020    Microbiology: Repeat blood cx NGTD at 24hr Studies/Results: MR THORACIC SPINE W WO CONTRAST  Result Date: 05/04/2020 CLINICAL DATA:  IV drug use, endocarditis EXAM: MRI THORACIC AND LUMBAR SPINE WITHOUT AND WITH CONTRAST TECHNIQUE: Multiplanar and multiecho pulse sequences of the thoracic and lumbar spine were obtained without and with intravenous contrast. CONTRAST:  13mL GADAVIST GADOBUTROL 1 MMOL/ML IV SOLN COMPARISON:  None. FINDINGS: Motion artifact is present. MRI THORACIC SPINE Alignment:  Major posterior alignment is maintained. Vertebrae: Vertebral body heights are preserved. There is no marrow edema. Decreased T1 marrow signal probably reflects hematopoietic marrow. No suspicious osseous lesion. Cord: No definite abnormal signal within significant above limitation. No epidural collection. Paraspinal and other soft tissues:  Incompletely evaluated bilateral nodular pulmonary opacities. Disc levels: There is no disc edema or enhancement. No degenerative stenosis. MRI LUMBAR SPINE Segmentation:  Standard. Alignment:  Anteroposterior alignment is maintained. Vertebrae: Vertebral body heights are preserved. There is no marrow edema. Decreased T1 marrow signal probably reflects hematopoietic marrow. No suspicious osseous lesion. Conus medullaris: Extends to approximately the L1-L2 level and appears normal. No epidural collection. Paraspinal and  other soft tissues: Unremarkable. Disc levels: Intervertebral disc heights and signal are maintained. No disc enhancement. There is no degenerative stenosis. IMPRESSION: No evidence of discitis/osteomyelitis or epidural collection. Electronically Signed   By: Guadlupe Spanish M.D.   On: 05/04/2020 11:11   MR Lumbar Spine W Wo Contrast  Result Date: 05/04/2020 CLINICAL DATA:  IV drug use, endocarditis EXAM: MRI THORACIC AND LUMBAR SPINE WITHOUT AND WITH CONTRAST TECHNIQUE: Multiplanar and multiecho pulse sequences of the thoracic and lumbar spine were obtained without and with intravenous contrast. CONTRAST:  79mL GADAVIST GADOBUTROL 1 MMOL/ML IV SOLN COMPARISON:  None. FINDINGS: Motion artifact is present. MRI THORACIC SPINE Alignment:  Major posterior alignment is maintained. Vertebrae: Vertebral body heights are preserved. There is no marrow edema. Decreased T1 marrow signal probably reflects hematopoietic marrow. No suspicious osseous lesion. Cord: No definite abnormal signal within significant above limitation. No epidural collection. Paraspinal and other soft tissues: Incompletely evaluated bilateral nodular pulmonary opacities. Disc levels: There is no disc edema or enhancement. No degenerative stenosis. MRI LUMBAR SPINE Segmentation:  Standard. Alignment:  Anteroposterior alignment is maintained. Vertebrae: Vertebral body heights are preserved. There is no marrow edema. Decreased T1 marrow signal probably reflects hematopoietic marrow. No suspicious osseous lesion. Conus medullaris: Extends to approximately the L1-L2 level and appears normal. No epidural collection. Paraspinal and other soft tissues: Unremarkable. Disc levels: Intervertebral disc heights and signal are maintained. No disc enhancement. There is no degenerative stenosis. IMPRESSION: No evidence of discitis/osteomyelitis or epidural collection. Electronically Signed   By: Guadlupe Spanish M.D.   On: 05/04/2020 11:11      Assessment/Plan: Disseminated MSSA bacteremia, with TV and PV endocarditis, seeding with pulmonary cavitary lesions.  - BCID showed MRSA but all cx growing MSSA. Plan to switch to cefazolin 2gm IV Q 8hr - recommend TEE to confirm left sided endocarditis  - plan for 6 wk of IV abtx  Sinus tachycardia = continue to monitor on telemetry.   Opiate dependence = defer to dr Natale Milch with management. Does not appear in distress at this time  [facility] Cell: [phone] Pager: [phone]  05/05/2020, 3:18 PM

## 2020-05-05 NOTE — Progress Notes (Signed)
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 homeless, asthma who presented to the ER after being found on the ground sleeping by a friend he stated.  EMS was called and he was transported to the hospital. In the ER with multiple imaging studies including x-ray of his right hand and wrist, x-ray of left hand, CXR, CT chest/abdomen/pelvis, CT C-spine, and CT head.  There were no acute fractures in his bilateral upper extremities.  CT head also unremarkable.  Remainder of his imaging studies revealed what appeared to be septic pulmonary emboli as well as septic emboli involving both kidneys. Cultures were drawn and he was started on vancomycin, cefepime, and Flagyl in the ER - 4/4 blood cultures were growing MRSA. TTE also performed on 7/9 revealing TV vegetation. Became verbally abusive/threatening 05/01/20 and Psych was consulted and a sitter was requested.  7/12 Patient developed worsening swelling in his right wrist and left hand. There was concern for septic joint or bursitis. A CT was ordered bilaterally which did show bilateral abscesses.  7/13 Bilateral I&D complaining of pain - admits to taking Carfentanyl at home/off the street.  7/15 planned TEE scratched due to patient taking p.o. despite being told not to overnight, rescheduled for the 16th   Interval History/ROS:  Overnight patient markedly noncompliant with n.p.o. orders, found eating food at bedside overnight, TEE was delayed until the 16th, lengthy discussion at bedside about need for dietary compliance around procedures.  Continues to complain of uncontrolled pain most notably in his left wrist but appears resting comfortably upon entering the room.  Otherwise denies nausea, vomiting, diarrhea, constipation, headache, fevers,  chills.   Assessment & Plan: Principal Problem:   Endocarditis Active Problems:   IVDU (intravenous drug user)   Sepsis (HCC)   Thrombocytopenia (HCC)   Hypokalemia   Hyponatremia   MRSA bacteremia   Malnutrition of moderate degree   Threatening behavior   Hand abscess   Elevated BUN   MRSA bacteremia with concurrent endocarditis in the setting of IV drug abuse - TTE performed on 04/29/20: TV vegetation (14mm) noted as well as possible veg on PV - TEE pending cardiology evaluation - MRI T/L spine pending - ID following - appreciate insight/recommendations - will need minimum 6 weeks IV vanc - Trend BC until clear (7/10 cultures remain NGTD) prior to PICC placement; patient will need to remain in hospital for duration of treatment course; no indication for IVC if tries to leave AMA - He is high risk for leaving AMA and for worsening infection and/or death if he does; called and spoke with his mom Myrene BuddyYvonne on 04/29/20 and updated her as well as told her all this as well  Hand abscess s/p I&D - Likely secondary to above - Bilateral abscesses on hands, s/p CT on 7/11 - Appreciate hand surgery consult; I&D on 7/13  Sepsis (HCC) secondary to above - See MRSA bacteremia  Acute metabolic encephalopathy, resolved - Considered due to IV drugs as well as sepsis/infection - Mentation has cleared and diet started   Thrombocytopenia (HCC) - Likely due to acute illness/sepsis; cannot rule out other etiologies yet - Trend CBC with infection treatment; slowly improving   Hyponatremia - Replete and recheck as needed  Elevated  LFTs - Possibly due to current infection/sepsis vs risk for hepatitis from IV drug use - Hepatitis panel noted for hep C Ab positive - Check HCV Quant: Low/undetectable - LFTs improving with infection treatment  Hypokalemia -Replete and recheck as needed  Anemia, likely multifactorial  -Likely in setting of malnutrition as below, cannot rule out iron deficiency  follow iron panel -No blood loss notable  Malnutrition of moderate degree Per RD: "Moderate Malnutrition related to social / environmental circumstances as evidenced by energy intake < 75% for > or equal to 3 months, moderate fat depletion, moderate muscle depletion, percent weight loss." - diet advanced on 7/10   Threatening behavior - on 7/11, patient endorsed multiple homicidal ideations to nurse and CNA in room; he threatened that he was going "to kill the attending" and all their family and kids; this was voiced several times despite nursing attempting to re-direct patient - psych eval requested at that time - no evidence of imminent risk to self/others  AKI, Resolved - BUN now downtrending appropriately; creatinine stable - Continue IVF - LR @ 75 while poorly tolerating PO Lab Results  Component Value Date   CREATININE 0.73 05/05/2020   CREATININE 0.88 05/04/2020   CREATININE 1.00 05/03/2020    Antimicrobials: Vanc 04/28/20 >> present Cefepime 04/28/20 - 04/29/20 Flagyl 04/28/20 - 04/29/20  DVT prophylaxis: Lovenox Code Status: Full Family Communication: Mother Disposition Plan:  Status is: Inpatient  Remains inpatient appropriate because:Ongoing active pain requiring inpatient pain management, Unsafe d/c plan, IV treatments appropriate due to intensity of illness or inability to take PO and Inpatient level of care appropriate due to severity of illness   Dispo: The patient is from: Home              Anticipated d/c is to: Home              Anticipated d/c date is: > 3 days              Patient currently is not medically stable to d/c.  Objective: Blood pressure (!) 142/85, pulse (!) 129, temperature (!) 97.4 F (36.3 C), temperature source Oral, resp. rate (!) 26, height 5\' 7"  (1.702 m), weight 55 kg, SpO2 92 %.   Intake/Output Summary (Last 24 hours) at 05/05/2020 0736 Last data filed at 05/05/2020 0600 Gross per 24 hour  Intake 3808.52 ml  Output 2850 ml  Net 958.52 ml     Last Weight  Most recent update: 04/28/2020  9:43 PM   Weight  55 kg (121 lb 4.1 oz)           Examination: General:  Pleasantly resting in bed, No acute distress. HEENT:  Normocephalic atraumatic.  Sclerae nonicteric, noninjected.  Extraocular movements intact bilaterally. Neck:  Without mass or deformity.  Trachea is midline. Lungs:  Clear to auscultate bilaterally without rhonchi, wheeze, or rales. Heart:  Regular rate and rhythm.  Without murmurs, rubs, or gallops. Abdomen:  Soft, nontender, nondistended.  Without guarding or rebound. Extremities/skin: Do not apprecaite any splinter hemorrhages or Osler notes/Janeway; bilateral wrists bandages clean dry intact  Neurologic: Grossly normal   Consultants:   ID  Hand surgery  Procedures:   TTE 04/29/2020  Data Reviewed: I have personally reviewed following labs and imaging studies Results for orders placed or performed during the hospital encounter of 04/28/20 (from the past 24 hour(s))  CBC     Status: Abnormal   Collection Time: 05/05/20  2:17 AM  Result Value Ref Range  WBC 16.5 (H) 4.0 - 10.5 K/uL   RBC 2.68 (L) 4.22 - 5.81 MIL/uL   Hemoglobin 7.4 (L) 13.0 - 17.0 g/dL   HCT 96.0 (L) 39 - 52 %   MCV 85.8 80.0 - 100.0 fL   MCH 27.6 26.0 - 34.0 pg   MCHC 32.2 30.0 - 36.0 g/dL   RDW 45.4 09.8 - 11.9 %   Platelets 221 150 - 400 K/uL   nRBC 0.0 0.0 - 0.2 %  Comprehensive metabolic panel     Status: Abnormal   Collection Time: 05/05/20  2:17 AM  Result Value Ref Range   Sodium 134 (L) 135 - 145 mmol/L   Potassium 4.5 3.5 - 5.1 mmol/L   Chloride 104 98 - 111 mmol/L   CO2 24 22 - 32 mmol/L   Glucose, Bld 124 (H) 70 - 99 mg/dL   BUN 24 (H) 6 - 20 mg/dL   Creatinine, Ser 1.47 0.61 - 1.24 mg/dL   Calcium 7.9 (L) 8.9 - 10.3 mg/dL   Total Protein 5.3 (L) 6.5 - 8.1 g/dL   Albumin 1.5 (L) 3.5 - 5.0 g/dL   AST 37 15 - 41 U/L   ALT 37 0 - 44 U/L   Alkaline Phosphatase 69 38 - 126 U/L   Total Bilirubin 0.6 0.3 - 1.2 mg/dL    GFR calc non Af Amer >60 >60 mL/min   GFR calc Af Amer >60 >60 mL/min   Anion gap 6 5 - 15    Recent Results (from the past 240 hour(s))  Culture, blood (routine x 2)     Status: Abnormal   Collection Time: 04/28/20  3:45 PM   Specimen: BLOOD  Result Value Ref Range Status   Specimen Description   Final    BLOOD LEFT ANTECUBITAL Performed at [facility], [address], [place], [place] [address]    Special Requests   Final    BOTTLES DRAWN AEROBIC AND ANAEROBIC Blood Culture adequate volume Performed at [facility], [address] [place], [place] [address]    Culture  Setup Time   Final    GRAM POSITIVE COCCI IN BOTH AEROBIC AND ANAEROBIC BOTTLES CRITICAL RESULT CALLED TO, READ BACK BY AND VERIFIED WITH: PHARMD M. Derrick Ravel [id number] FCP    Culture (A)  Final    STAPHYLOCOCCUS AUREUS METHICILLIN RESISTANT ORGANISM WAS NOT RECOVERED IN CULTURE. REPEATED FOR CONFIRMATION Performed at [facility], [address], [place], [place] [address]    Report Status 05/03/2020 FINAL  Final   Organism ID, Bacteria STAPHYLOCOCCUS AUREUS  Final      Susceptibility   Staphylococcus aureus - MIC*    CIPROFLOXACIN <=0.5 SENSITIVE Sensitive     ERYTHROMYCIN >=8 RESISTANT Resistant     GENTAMICIN <=0.5 SENSITIVE Sensitive     OXACILLIN 0.5 SENSITIVE Sensitive     TETRACYCLINE <=1 SENSITIVE Sensitive     VANCOMYCIN <=0.5 SENSITIVE Sensitive     TRIMETH/SULFA <=10 SENSITIVE Sensitive     CLINDAMYCIN <=0.25 SENSITIVE Sensitive     RIFAMPIN <=0.5 SENSITIVE Sensitive     Inducible Clindamycin NEGATIVE Sensitive     * STAPHYLOCOCCUS AUREUS  Blood Culture ID Panel (Reflexed)     Status: Abnormal   Collection Time: 04/28/20  3:45 PM  Result Value Ref Range Status   Enterococcus species NOT DETECTED NOT DETECTED Final   Listeria monocytogenes NOT DETECTED NOT DETECTED Final   Staphylococcus species DETECTED (A) NOT DETECTED Final  Comment:  CRITICAL RESULT CALLED TO, READ BACK BY AND VERIFIED WITH: PHARMD Lynder Parents [phone] FCP    Staphylococcus aureus (BCID) DETECTED (A) NOT DETECTED Final    Comment: Methicillin (oxacillin)-resistant Staphylococcus aureus (MRSA). MRSA is predictably resistant to beta-lactam antibiotics (except ceftaroline). Preferred therapy is vancomycin unless clinically contraindicated. Patient requires contact precautions if  hospitalized. CRITICAL RESULT CALLED TO, READ BACK BY AND VERIFIED WITH: PHARMD M. Derrick Ravel [phone] FCP    Methicillin resistance DETECTED (A) NOT DETECTED Final    Comment: CRITICAL RESULT CALLED TO, READ BACK BY AND VERIFIED WITH: PHARMD M. RENZ [phone] FCP    Streptococcus species NOT DETECTED NOT DETECTED Final   Streptococcus agalactiae NOT DETECTED NOT DETECTED Final   Streptococcus pneumoniae NOT DETECTED NOT DETECTED Final   Streptococcus pyogenes NOT DETECTED NOT DETECTED Final   Acinetobacter baumannii NOT DETECTED NOT DETECTED Final   Enterobacteriaceae species NOT DETECTED NOT DETECTED Final   Enterobacter cloacae complex NOT DETECTED NOT DETECTED Final   Escherichia coli NOT DETECTED NOT DETECTED Final   Klebsiella oxytoca NOT DETECTED NOT DETECTED Final   Klebsiella pneumoniae NOT DETECTED NOT DETECTED Final   Proteus species NOT DETECTED NOT DETECTED Final   Serratia marcescens NOT DETECTED NOT DETECTED Final   Haemophilus influenzae NOT DETECTED NOT DETECTED Final   Neisseria meningitidis NOT DETECTED NOT DETECTED Final   Pseudomonas aeruginosa NOT DETECTED NOT DETECTED Final   Candida albicans NOT DETECTED NOT DETECTED Final   Candida glabrata NOT DETECTED NOT DETECTED Final   Candida krusei NOT DETECTED NOT DETECTED Final   Candida parapsilosis NOT DETECTED NOT DETECTED Final   Candida tropicalis NOT DETECTED NOT DETECTED Final    Comment: Performed at [facility], [address], [place], [place] [address]  Culture, blood (routine x 2)      Status: Abnormal   Collection Time: 04/28/20  4:13 PM   Specimen: BLOOD  Result Value Ref Range Status   Specimen Description   Final    BLOOD RIGHT ANTECUBITAL Performed at [facility], [address], [place], [place] [address]    Special Requests   Final    BOTTLES DRAWN AEROBIC AND ANAEROBIC Blood Culture adequate volume Performed at [facility], [address], [place], [place] [address]    Culture  Setup Time   Final    GRAM POSITIVE COCCI IN BOTH AEROBIC AND ANAEROBIC BOTTLES CRITICAL VALUE NOTED.  VALUE IS CONSISTENT WITH PREVIOUSLY REPORTED AND CALLED VALUE.    Culture (A)  Final    STAPHYLOCOCCUS AUREUS METHICILLIN RESISTANT ORGANISM WAS NOT RECOVERED IN CULTURE. REPEATED FOR CONFIRMATION Performed at [facility], [address], [place], [place] [address]    Report Status 05/03/2020 FINAL  Final   Organism ID, Bacteria STAPHYLOCOCCUS AUREUS  Final      Susceptibility   Staphylococcus aureus - MIC*    CIPROFLOXACIN <=0.5 SENSITIVE Sensitive     ERYTHROMYCIN >=8 RESISTANT Resistant     GENTAMICIN <=0.5 SENSITIVE Sensitive     OXACILLIN 0.5 SENSITIVE Sensitive     TETRACYCLINE <=1 SENSITIVE Sensitive     VANCOMYCIN <=0.5 SENSITIVE Sensitive     TRIMETH/SULFA <=10 SENSITIVE Sensitive     CLINDAMYCIN <=0.25 SENSITIVE Sensitive     RIFAMPIN <=0.5 SENSITIVE Sensitive     Inducible Clindamycin NEGATIVE Sensitive     * STAPHYLOCOCCUS AUREUS  Urine culture     Status: Abnormal   Collection Time: 04/28/20  7:00 PM  Specimen: In/Out Cath Urine  Result Value Ref Range Status   Specimen Description   Final    IN/OUT CATH URINE Performed at [facility], [address], [place], [place] [address]    Special Requests   Final    NONE Performed at [facility], [address] [place], [place] [address]    Culture >=100,000 COLONIES/mL STAPHYLOCOCCUS AUREUS (A)  Final   Report Status  05/01/2020 FINAL  Final   Organism ID, Bacteria STAPHYLOCOCCUS AUREUS (A)  Final      Susceptibility   Staphylococcus aureus - MIC*    CIPROFLOXACIN <=0.5 SENSITIVE Sensitive     GENTAMICIN <=0.5 SENSITIVE Sensitive     NITROFURANTOIN <=16 SENSITIVE Sensitive     OXACILLIN 0.5 SENSITIVE Sensitive     TETRACYCLINE <=1 SENSITIVE Sensitive     VANCOMYCIN <=0.5 SENSITIVE Sensitive     TRIMETH/SULFA <=10 SENSITIVE Sensitive     CLINDAMYCIN <=0.25 SENSITIVE Sensitive     RIFAMPIN <=0.5 SENSITIVE Sensitive     Inducible Clindamycin NEGATIVE Sensitive     * >=100,000 COLONIES/mL STAPHYLOCOCCUS AUREUS  SARS Coronavirus 2 by RT PCR (hospital order, performed in [facility] hospital lab) Nasopharyngeal Nasopharyngeal Swab     Status: None   Collection Time: 04/28/20  7:03 PM   Specimen: Nasopharyngeal Swab  Result Value Ref Range Status   SARS Coronavirus 2 NEGATIVE NEGATIVE Final    Comment: (NOTE) SARS-CoV-2 target nucleic acids are NOT DETECTED.  The SARS-CoV-2 RNA is generally detectable in upper and lower respiratory specimens during the acute phase of infection. The lowest concentration of SARS-CoV-2 viral copies this assay can detect is 250 copies / mL. A negative result does not preclude SARS-CoV-2 infection and should not be used as the sole basis for treatment or other patient management decisions.  A negative result may occur with improper specimen collection / handling, submission of specimen other than nasopharyngeal swab, presence of viral mutation(s) within the areas targeted by this assay, and inadequate number of viral copies (<250 copies / mL). A negative result must be combined with clinical observations, patient history, and epidemiological information.  Fact Sheet for Patients:   BoilerBrush.com.cy  Fact Sheet for Healthcare Providers: https://pope.com/  This test is not yet approved or  cleared by the [place] FDA  and has been authorized for detection and/or diagnosis of SARS-CoV-2 by FDA under an Emergency Use Authorization (EUA).  This EUA will remain in effect (meaning this test can be used) for the duration of the COVID-19 declaration under Section 564(b)(1) of the Act, 21 U.S.C. section 360bbb-3(b)(1), unless the authorization is terminated or revoked sooner.  Performed at [facility], [address], [place], [place] [address]   MRSA PCR Screening     Status: Abnormal   Collection Time: 04/28/20  7:45 PM   Specimen: Nasal Mucosa; Nasopharyngeal  Result Value Ref Range Status   MRSA by PCR POSITIVE (A) NEGATIVE Final    Comment:        The GeneXpert MRSA Assay (FDA approved for NASAL specimens only), is one component of a comprehensive MRSA colonization surveillance program. It is not intended to diagnose MRSA infection nor to guide or monitor treatment for MRSA infections. RESULT CALLED TO, READ BACK BY AND VERIFIED WITH: S SMITH AT 2314 ON 04/28/2020 BY MOSLEY,J  Performed at [facility], [address], [place], [place] [address]   Culture, blood (Routine X 2) w Reflex to ID Panel  Status: None (Preliminary result)   Collection Time: 04/30/20  8:15 AM   Specimen: BLOOD  Result Value Ref Range Status   Specimen Description   Final    BLOOD LEFT ANTECUBITAL Performed at [facility], [address] [place], [place] [address]    Special Requests   Final    BOTTLES DRAWN AEROBIC AND ANAEROBIC Blood Culture adequate volume Performed at [facility], [address], [place], [place] [address]    Culture   Final    NO GROWTH 4 DAYS Performed at [facility], [address] [place], [place] [address]    Report Status PENDING  Incomplete  Culture, blood (Routine X 2) w Reflex to ID Panel     Status: None (Preliminary result)   Collection Time: 04/30/20  8:15 AM   Specimen: BLOOD LEFT FOREARM  Result Value  Ref Range Status   Specimen Description   Final    BLOOD LEFT FOREARM Performed at [facility], [address], [place], [place] [address]    Special Requests   Final    BOTTLES DRAWN AEROBIC ONLY Blood Culture adequate volume Performed at [facility], [address] [place], [place] [address]    Culture   Final    NO GROWTH 4 DAYS Performed at [facility], [address], [place], [place] [address]    Report Status PENDING  Incomplete  Aerobic/Anaerobic Culture (surgical/deep wound)     Status: None (Preliminary result)   Collection Time: 05/03/20  9:35 AM   Specimen: Soft Tissue, Other  Result Value Ref Range Status   Specimen Description   Final    TISSUE RT WRIST Performed at [facility], [address], [place], [place] [address]    Special Requests   Final    PODIATRY [place] Performed at [facility], [address], [place], [place] [address]    Gram Stain   Final    RARE WBC PRESENT,BOTH PMN AND MONONUCLEAR RARE GRAM POSITIVE COCCI IN PAIRS IN CLUSTERS Performed at [facility], [address], [place], [place] [address]    Culture RARE STAPHYLOCOCCUS AUREUS  Final   Report Status PENDING  Incomplete  Aerobic/Anaerobic Culture (surgical/deep wound)     Status: None (Preliminary result)   Collection Time: 05/03/20  9:38 AM   Specimen: Soft Tissue, Other  Result Value Ref Range Status   Specimen Description   Final    TISSUE LT HAND Performed at [facility], [address], [place], [place] [address]    Special Requests   Final    PATIENT ON FOLLOWING ANCEF, [facility] Performed at [facility], [address], [place], [place] [address]    Gram Stain   Final    NO WBC SEEN RARE GRAM POSITIVE COCCI IN PAIRS IN CLUSTERS Performed at [facility], [address], [place], [place] [address]    Culture FEW STAPHYLOCOCCUS AUREUS   Final   Report Status PENDING  Incomplete     Radiology Studies: MR THORACIC SPINE W WO CONTRAST  Result Date: 05/04/2020 CLINICAL DATA:  IV drug use, endocarditis EXAM: MRI THORACIC AND LUMBAR SPINE WITHOUT AND WITH CONTRAST TECHNIQUE: Multiplanar and multiecho pulse sequences of the thoracic and lumbar spine were obtained without and with intravenous contrast. CONTRAST:  20mL GADAVIST GADOBUTROL 1 MMOL/ML IV SOLN COMPARISON:  None. FINDINGS: Motion artifact is present. MRI THORACIC  SPINE Alignment:  Major posterior alignment is maintained. Vertebrae: Vertebral body heights are preserved. There is no marrow edema. Decreased T1 marrow signal probably reflects hematopoietic marrow. No suspicious osseous lesion. Cord: No definite abnormal signal within significant above limitation. No epidural collection. Paraspinal and other soft tissues: Incompletely evaluated bilateral nodular pulmonary opacities. Disc levels: There is no disc edema or enhancement. No degenerative stenosis. MRI LUMBAR SPINE Segmentation:  Standard. Alignment:  Anteroposterior alignment is maintained. Vertebrae: Vertebral body heights are preserved. There is no marrow edema. Decreased T1 marrow signal probably reflects hematopoietic marrow. No suspicious osseous lesion. Conus medullaris: Extends to approximately the L1-L2 level and appears normal. No epidural collection. Paraspinal and other soft tissues: Unremarkable. Disc levels: Intervertebral disc heights and signal are maintained. No disc enhancement. There is no degenerative stenosis. IMPRESSION: No evidence of discitis/osteomyelitis or epidural collection. Electronically Signed   By: Guadlupe Spanish M.D.   On: 05/04/2020 11:11   MR Lumbar Spine W Wo Contrast  Result Date: 05/04/2020 CLINICAL DATA:  IV drug use, endocarditis EXAM: MRI THORACIC AND LUMBAR SPINE WITHOUT AND WITH CONTRAST TECHNIQUE: Multiplanar and multiecho pulse sequences of the thoracic and lumbar spine were obtained  without and with intravenous contrast. CONTRAST:  5mL GADAVIST GADOBUTROL 1 MMOL/ML IV SOLN COMPARISON:  None. FINDINGS: Motion artifact is present. MRI THORACIC SPINE Alignment:  Major posterior alignment is maintained. Vertebrae: Vertebral body heights are preserved. There is no marrow edema. Decreased T1 marrow signal probably reflects hematopoietic marrow. No suspicious osseous lesion. Cord: No definite abnormal signal within significant above limitation. No epidural collection. Paraspinal and other soft tissues: Incompletely evaluated bilateral nodular pulmonary opacities. Disc levels: There is no disc edema or enhancement. No degenerative stenosis. MRI LUMBAR SPINE Segmentation:  Standard. Alignment:  Anteroposterior alignment is maintained. Vertebrae: Vertebral body heights are preserved. There is no marrow edema. Decreased T1 marrow signal probably reflects hematopoietic marrow. No suspicious osseous lesion. Conus medullaris: Extends to approximately the L1-L2 level and appears normal. No epidural collection. Paraspinal and other soft tissues: Unremarkable. Disc levels: Intervertebral disc heights and signal are maintained. No disc enhancement. There is no degenerative stenosis. IMPRESSION: No evidence of discitis/osteomyelitis or epidural collection. Electronically Signed   By: Guadlupe Spanish M.D.   On: 05/04/2020 11:11   DG CHEST PORT 1 VIEW  Result Date: 05/03/2020 CLINICAL DATA:  Sepsis, shortness of breath EXAM: PORTABLE CHEST 1 VIEW COMPARISON:  Chest x-ray 05/02/2020.  Chest CT 04/28/2020. FINDINGS: Multiple masslike nodular airspace opacities are seen throughout the lungs, stable. Heart is borderline in size. No visible effusions or pneumothorax. No real change since prior study. IMPRESSION: No significant change in numerous masslike airspace opacities. Electronically Signed   By: Charlett Nose M.D.   On: 05/03/2020 08:33   MR Lumbar Spine W Wo Contrast  Final Result    MR THORACIC SPINE W WO  CONTRAST  Final Result    DG CHEST PORT 1 VIEW  Final Result    DG CHEST PORT 1 VIEW  Final Result    CT WRIST RIGHT W CONTRAST  Final Result    CT HAND LEFT W CONTRAST  Final Result    US Abdomen Limited RUQ  Final Result    CT Abdomen Pelvis W Contrast  Final Result    CT Head Wo Contrast  Final Result    CT Cervical Spine Wo Contrast  Final Result    CT Chest W Contrast  Final Result    DG Chest [facility] 1 View  Final Result    DG Wrist Complete Right  Final Result    DG Hand Complete Right  Final Result    DG Hand Complete Left  Final Result       Scheduled Meds:  Chlorhexidine Gluconate Cloth  6 each Topical Q0600   docusate sodium  100 mg Oral BID   enoxaparin (LOVENOX) injection  40 mg Subcutaneous Q24H   feeding supplement  1 Container Oral TID BM   mouth rinse  15 mL Mouth Rinse BID   sodium chloride flush  3 mL Intravenous Q12H   PRN Meds: acetaminophen **OR** acetaminophen, ipratropium-albuterol, LORazepam, morphine injection, ondansetron **OR** ondansetron (ZOFRAN) IV, oxyCODONE Continuous Infusions:  sodium chloride Stopped (05/05/20 0559)   lactated ringers 75 mL/hr at 05/05/20 0600   vancomycin Stopped (05/05/20 0436)    LOS: 7 days  Time spent:  Azucena Fallen, DO [facility] 05/05/2020, 7:36 AM  Contact via secure chat.

## 2020-05-05 NOTE — Progress Notes (Signed)
Nutrition Follow-up  DOCUMENTATION CODES:   Non-severe (moderate) malnutrition in context of social or environmental circumstances  INTERVENTION:  - will d/c Boost Breeze. - will order Ensure Max once/day, each supplement provides 150 kcal and 30 grams protein. - will order 1 tablet multivitamin with minerals/day.  NUTRITION DIAGNOSIS:   Moderate Malnutrition related to social / environmental circumstances as evidenced by energy intake < 75% for > or equal to 3 months, moderate fat depletion, moderate muscle depletion, percent weight loss. -ongoing, improving with improved intakes  GOAL:   Patient will meet greater than or equal to 90% of their needs -met on average   MONITOR:   PO intake, Supplement acceptance, Labs, Weight trends  ASSESSMENT:   Pt admitted for multiple septic emboli suspicious for possible endocarditis in the setting of IV drug use resulting in sepsis. PMH significant for polysubstance abuse.  He has been eating 100% of meals over the past week. He was only accepting Boost Breeze ~25% of the time offered. Patient is currently out of room to MRI.  He has not been weighed since admission on 7/8. Flow sheet indicates mild edema to BUE.   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 Medications reviewed; 2 tablets oscal-d x2 doses 7/13 and x2 doses 7/14, 100 mg colace BID.  IVF; LR @ 75 ml/hr.    Diet Order:   Diet Order            Diet NPO time specified  Diet effective midnight           Diet Heart Room service appropriate? Yes; Fluid consistency: Thin  Diet effective now                 EDUCATION NEEDS:   Not appropriate for education at this time  Skin:  Skin Assessment: Skin Integrity Issues: Skin Integrity Issues::  Incisions Incisions: L + R arm (7/13)  Last BM:  7/13  Height:   Ht Readings from Last 1 Encounters:  04/28/20 _0  (1.702 m)    Weight:   Wt Readings from Last 1 Encounters:  04/28/20 55 kg     Estimated Nutritional Needs:  Kcal:  1800-2000 Protein:  85-95 grams Fluid:  >1.8L/d     Jarome Matin, MS, RD, LDN, CNSC Inpatient Clinical Dietitian RD pager # available in [facility]  After hours/weekend pager # available in [facility]

## 2020-05-05 NOTE — Progress Notes (Signed)
TEE for 05/05/20 was cancelled by AM Endo charge RN due to patient not NPO. Rescheduled 05/06/2020 1130. [facility] set up for 1030 arrival time at [facility] Endo. Primary RN at [facility] notified of this. Advised that patient has current order for NPO after midnight 05/06/20.

## 2020-05-05 NOTE — Progress Notes (Signed)
Patient was upset when he learned that he was NPO at midnight. Patient continually asked for a drink or ice chips but was told that only a mouth swab would be appropriate until the procedure. Patient refused the swabs. Upon entering the room around 03:00, pt had eaten the remains of his chick fil a (brought by mother for lunch) and drank the closed juices on the table. After asking the patient if he consumed the food, explaining that the risk for aspiration during the procedure could mean death, he denied consuming any food or drink. Nightshift RN will report to dayshift RN that 12:30 TEE procedure must be cancelled. Patient has been notified that procedure will be cancelled due to noncompliance with NPO order.

## 2020-05-06 ENCOUNTER — Inpatient Hospital Stay (HOSPITAL_COMMUNITY): Payer: Self-pay | Admitting: Certified Registered Nurse Anesthetist

## 2020-05-06 ENCOUNTER — Inpatient Hospital Stay (HOSPITAL_COMMUNITY): Payer: Self-pay

## 2020-05-06 ENCOUNTER — Encounter (HOSPITAL_COMMUNITY)
Admission: EM | Disposition: A | Discharge status: Home / Self Care | Payer: Self-pay | Source: Home / Self Care | Attending: Internal Medicine

## 2020-05-06 ENCOUNTER — Encounter (HOSPITAL_COMMUNITY): Payer: Self-pay | Admitting: Internal Medicine

## 2020-05-06 DIAGNOSIS — B3321 Viral endocarditis: Secondary | ICD-10-CM

## 2020-05-06 DIAGNOSIS — I361 Nonrheumatic tricuspid (valve) insufficiency: Secondary | ICD-10-CM

## 2020-05-06 HISTORY — PX: TEE WITHOUT CARDIOVERSION: SHX5443

## 2020-05-06 HISTORY — PX: BUBBLE STUDY: SHX6837

## 2020-05-06 LAB — COMPREHENSIVE METABOLIC PANEL
ALT: 33 U/L (ref 0–44)
AST: 35 U/L (ref 15–41)
Albumin: 1.6 g/dL — ABNORMAL LOW (ref 3.5–5.0)
Alkaline Phosphatase: 67 U/L (ref 38–126)
Anion gap: 4 — ABNORMAL LOW (ref 5–15)
BUN: 17 mg/dL (ref 6–20)
CO2: 25 mmol/L (ref 22–32)
Calcium: 7.6 mg/dL — ABNORMAL LOW (ref 8.9–10.3)
Chloride: 106 mmol/L (ref 98–111)
Creatinine, Ser: 0.65 mg/dL (ref 0.61–1.24)
GFR calc Af Amer: >60 mL/min (ref >60)
GFR calc non Af Amer: >60 mL/min (ref >60)
Glucose, Bld: 94 mg/dL (ref 70–99)
Potassium: 4.7 mmol/L (ref 3.5–5.1)
Sodium: 135 mmol/L (ref 135–145)
Total Bilirubin: 0.4 mg/dL (ref 0.3–1.2)
Total Protein: 5.3 g/dL — ABNORMAL LOW (ref 6.5–8.1)

## 2020-05-06 LAB — FOLATE: Folate: 7.8 ng/mL (ref >5.9)

## 2020-05-06 LAB — CBC
HCT: 21.3 % — ABNORMAL LOW (ref 39.0–52.0)
Hemoglobin: 6.8 g/dL — CL (ref 13.0–17.0)
MCH: 27.5 pg (ref 26.0–34.0)
MCHC: 31.9 g/dL (ref 30.0–36.0)
MCV: 86.2 fL (ref 80.0–100.0)
Platelets: 214 10*3/uL (ref 150–400)
RBC: 2.47 MIL/uL — ABNORMAL LOW (ref 4.22–5.81)
RDW: 15.1 % (ref 11.5–15.5)
WBC: 13.8 10*3/uL — ABNORMAL HIGH (ref 4.0–10.5)
nRBC: 0 % (ref 0.0–0.2)

## 2020-05-06 LAB — POCT I-STAT, CHEM 8
BUN: 15 mg/dL (ref 6–20)
Calcium, Ion: 1.12 mmol/L — ABNORMAL LOW (ref 1.15–1.40)
Chloride: 102 mmol/L (ref 98–111)
Creatinine, Ser: 0.6 mg/dL — ABNORMAL LOW (ref 0.61–1.24)
Glucose, Bld: 90 mg/dL (ref 70–99)
HCT: 26 % — ABNORMAL LOW (ref 39.0–52.0)
Hemoglobin: 8.8 g/dL — ABNORMAL LOW (ref 13.0–17.0)
Potassium: 4.6 mmol/L (ref 3.5–5.1)
Sodium: 138 mmol/L (ref 135–145)
TCO2: 24 mmol/L (ref 22–32)

## 2020-05-06 LAB — RETICULOCYTES
Immature Retic Fract: 14.2 % (ref 2.3–15.9)
RBC.: 2.41 MIL/uL — ABNORMAL LOW (ref 4.22–5.81)
Retic Count, Absolute: 50.9 10*3/uL (ref 19.0–186.0)
Retic Ct Pct: 2.1 % (ref 0.4–3.1)

## 2020-05-06 LAB — IRON AND TIBC
Iron: 21 ug/dL — ABNORMAL LOW (ref 45–182)
Saturation Ratios: 13 % — ABNORMAL LOW (ref 17.9–39.5)
TIBC: 156 ug/dL — ABNORMAL LOW (ref 250–450)
UIBC: 135 ug/dL

## 2020-05-06 LAB — FERRITIN: Ferritin: 507 ng/mL — ABNORMAL HIGH (ref 24–336)

## 2020-05-06 LAB — BLOOD GAS, ARTERIAL
Acid-base deficit: 0.6 mmol/L (ref 0.0–2.0)
Bicarbonate: 22 mmol/L (ref 20.0–28.0)
O2 Saturation: 93.1 %
Patient temperature: 98.6
pCO2 arterial: 29.8 mmHg — ABNORMAL LOW (ref 32.0–48.0)
pH, Arterial: 7.482 — ABNORMAL HIGH (ref 7.350–7.450)
pO2, Arterial: 64.3 mmHg — ABNORMAL LOW (ref 83.0–108.0)

## 2020-05-06 LAB — VITAMIN B12: Vitamin B-12: 1111 pg/mL — ABNORMAL HIGH (ref 180–914)

## 2020-05-06 LAB — ABO/RH: ABO/RH(D): O POS

## 2020-05-06 LAB — PREPARE RBC (CROSSMATCH)

## 2020-05-06 SURGERY — ECHOCARDIOGRAM, TRANSESOPHAGEAL
Anesthesia: Monitor Anesthesia Care

## 2020-05-06 MED ORDER — FENTANYL CITRATE (PF) 100 MCG/2ML IJ SOLN
INTRAMUSCULAR | Status: DC | PRN
  Administered 2020-05-06: 50 ug via INTRAVENOUS
  Administered 2020-05-06 (×2): 25 ug via INTRAVENOUS

## 2020-05-06 MED ORDER — LACTATED RINGERS IV BOLUS
500.0000 mL | Freq: Once | INTRAVENOUS | Status: AC
  Administered 2020-05-06: 500 mL via INTRAVENOUS

## 2020-05-06 MED ORDER — SODIUM CHLORIDE 0.9 % IV SOLN: INTRAVENOUS | Status: DC | PRN

## 2020-05-06 MED ORDER — KETOROLAC TROMETHAMINE 30 MG/ML IJ SOLN
30.0000 mg | Freq: Once | INTRAMUSCULAR | Status: AC
  Administered 2020-05-06: 30 mg via INTRAVENOUS
  Filled 2020-05-06: qty 1

## 2020-05-06 MED ORDER — METOPROLOL TARTRATE 5 MG/5ML IV SOLN
5.0000 mg | INTRAVENOUS | Status: AC | PRN
  Administered 2020-05-06 – 2020-05-08 (×2): 5 mg via INTRAVENOUS
  Filled 2020-05-06 (×2): qty 5

## 2020-05-06 MED ORDER — FENTANYL CITRATE (PF) 100 MCG/2ML IJ SOLN
INTRAMUSCULAR | Status: AC
  Filled 2020-05-06: qty 2

## 2020-05-06 MED ORDER — SODIUM CHLORIDE 0.9 % IV BOLUS
500.0000 mL | Freq: Once | INTRAVENOUS | Status: AC
  Administered 2020-05-06: 500 mL via INTRAVENOUS

## 2020-05-06 MED ORDER — BUTAMBEN-TETRACAINE-BENZOCAINE 2-2-14 % EX AERO
INHALATION_SPRAY | CUTANEOUS | Status: DC | PRN
  Administered 2020-05-06: 2 via TOPICAL

## 2020-05-06 MED ORDER — PROPOFOL 500 MG/50ML IV EMUL
INTRAVENOUS | Status: DC | PRN
  Administered 2020-05-06: 150 ug/kg/min via INTRAVENOUS

## 2020-05-06 MED ORDER — SODIUM CHLORIDE 0.9% IV SOLUTION: Freq: Once | INTRAVENOUS | Status: AC

## 2020-05-06 MED ORDER — PROPOFOL 10 MG/ML IV BOLUS
INTRAVENOUS | Status: DC | PRN
  Administered 2020-05-06: 10 mg via INTRAVENOUS

## 2020-05-06 NOTE — Progress Notes (Signed)
Echocardiogram Echocardiogram Transesophageal has been performed.  Warren Lacy Renne Cornick 05/06/2020, 1:48 PM

## 2020-05-06 NOTE — Plan of Care (Signed)
  Problem: Health Behavior/Discharge Planning: Goal: Ability to manage health-related needs will improve Outcome: Not Progressing   Problem: Clinical Measurements: Goal: Ability to maintain clinical measurements within normal limits will improve Outcome: Progressing

## 2020-05-06 NOTE — Anesthesia Preprocedure Evaluation (Signed)
Anesthesia Evaluation  Patient identified by MRN, date of birth, ID band Patient awake    Reviewed: Allergy & Precautions, H&P , NPO status , Patient's Chart, lab work & pertinent test results, reviewed documented beta blocker date and time   Airway Mallampati: II  TM Distance: >3 FB Neck ROM: full    Dental  (+) Poor Dentition, Dental Advisory Given   Pulmonary asthma , former smoker,    Pulmonary exam normal breath sounds clear to auscultation       Cardiovascular Exercise Tolerance: Good  Rhythm:regular Rate:Normal     Neuro/Psych negative neurological ROS  negative psych ROS   GI/Hepatic (+)     substance abuse  IV drug use, 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 Rpt 8.8Hb   Anesthesia Other Findings disseminated MSSA bacteremia/ TV and possible pulmonic/AV vegetation  Reproductive/Obstetrics negative OB ROS                             Anesthesia Physical Anesthesia Plan  ASA: III  Anesthesia Plan: MAC   Post-op Pain Management:    Induction: Intravenous  PONV Risk Score and Plan: 1 and Propofol infusion, TIVA and Treatment may vary due to age or medical condition  Airway Management Planned: Natural Airway and Nasal Cannula  Additional Equipment: None  Intra-op Plan:   Post-operative Plan: Extubation in OR  Informed Consent: I have reviewed the patients History and Physical, chart, labs and discussed the procedure including the risks, benefits and alternatives for the proposed anesthesia with the patient or authorized representative who has indicated his/her  understanding and acceptance.     Dental advisory given  Plan Discussed with: CRNA  Anesthesia Plan Comments:         Anesthesia Quick Evaluation

## 2020-05-06 NOTE — H&P (Signed)
   INTERVAL PROCEDURE H&P  History and Physical Interval Note:  05/06/2020 12:00 PM  Clifford Clark has presented today for their planned procedure. The various methods of treatment have been discussed with the patient and family. After consideration of risks, benefits and other options for treatment, the patient has consented to the procedure.  The patients' outpatient history has been reviewed, patient examined, and no change in status from most recent office note within the past 30 days. I have reviewed the patients' chart and labs and will proceed as planned. Questions were answered to the patient's satisfaction.   [name], MD, [facility], FACP  [place]  [facility]  Medical Director of the Advanced Lipid Disorders &  Cardiovascular Risk Reduction Clinic Diplomate of the American Board of Clinical Lipidology Attending Cardiologist  Direct Dial: [phone]  Fax: [phone]  Website:  [email or web address] Nicely Marieanne Marxen 05/06/2020, 12:00 PM

## 2020-05-06 NOTE — CV Procedure (Signed)
TRANSESOPHAGEAL ECHOCARDIOGRAM (TEE) NOTE  INDICATIONS: infective endocarditis  PROCEDURE:   Informed consent was obtained prior to the procedure. The risks, benefits and alternatives for the procedure were discussed and the patient comprehended these risks.  Risks include, but are not limited to, cough, sore throat, vomiting, nausea, somnolence, esophageal and stomach trauma or perforation, bleeding, low blood pressure, aspiration, pneumonia, infection, trauma to the teeth and death.    After a procedural time-out, the patient was given propofol per anesthesia for sedation.  The patient's heart rate, blood pressure, and oxygen saturation are monitored continuously during the procedure.The oropharynx was anesthetized with 2 sprays of topical cetacaine.  The transesophageal probe was inserted in the esophagus and stomach without difficulty and multiple views were obtained.  The patient was kept under observation until the patient left the procedure room.  I was present face-to-face 100% of this time. The patient left the procedure room in stable condition.   Agitated microbubble saline contrast was not administered.  COMPLICATIONS:    There were no immediate complications.  Findings:  1. LEFT VENTRICLE: The left ventricular wall thickness is normal.  The left ventricular cavity is normal in size. Wall motion is normal.  LVEF is 60-65%.  2. RIGHT VENTRICLE:  The right ventricle is normal in structure and function without any thrombus or masses.    3. LEFT ATRIUM:  The left atrium is normal in size without any thrombus or masses.  There is not spontaneous echo contrast ("smoke") in the left atrium consistent with a low flow state.  4. LEFT ATRIAL APPENDAGE:  The large windsock left atrial appendage is free of any thrombus or masses. The appendage has single lobes. Pulse doppler indicates high flow in the appendage.  5. ATRIAL SEPTUM:  The thin atrial septum appears intact, but is  hypermobile and is free of thrombus and/or masses.  There is no evidence for interatrial shunting by color doppler and saline microbubble.  6. RIGHT ATRIUM:  The right atrium is normal in size and function without any thrombus or masses.  7. MITRAL VALVE:  The mitral valve is normal in structure and function with trivial regurgitation.  There were no vegetations or stenosis.  8. AORTIC VALVE:  The aortic valve is trileaflet, normal in structure and function with no regurgitation.  There were no vegetations or stenosis  9. TRICUSPID VALVE:  The tricuspid valve is abnormal, demonstrating a large mobile vegetation measuring about 1 x 1.5 cm that prolapses in and out of the TV valve plane - visualized with 2D/3D modes. There is associated  Severe regurgitation.   10.  PULMONIC VALVE:  The pulmonic valve is abnormal with a small echogenic oscillating structure suggestive of vegetation. There is only trivial regurgitation.    11. AORTIC ARCH, ASCENDING AND DESCENDING AORTA:  There was no Myrtis Ser et. Al, 1992) atherosclerosis of the ascending aorta, aortic arch, or proximal descending aorta.  12. PULMONARY VEINS: Anomalous pulmonary venous return was not noted.  13. PERICARDIUM: The pericardium appeared normal and non-thickened.  There is a small to moderate-sized circumferential pericardial effusion.  IMPRESSION:   1. 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 2. Probable small vegetation of the pulmonic valve with trivial PR. 3. No evidence of left-sided endocarditis. 4. No LAA thrombus 5. Negative for PFO 6. LVEF 60-65% 7. Small to moderate-sized circumferential pericardial effusion.  RECOMMENDATIONS:    1. Further recommendations and management per ID. 2. Recommend CT surgical evaluation for possible angiovac  catheter removal of vegetation.  Time Spent Directly with the Patient:  60 minutes   [name], MD, [facility], FACP     [facility]  HeartCare  Medical Director of the Advanced Lipid Disorders &  Cardiovascular Risk Reduction Clinic Diplomate of the American Board of Clinical Lipidology Attending Cardiologist  Direct Dial: [phone]   Fax: [phone]  Website:  [email or web address] Nicely Rober Skeels 05/06/2020, 11:46 AM

## 2020-05-06 NOTE — Progress Notes (Addendum)
[facility]    Date of Admission:  04/28/2020   Total days of antibiotics 9            ID: Clifford Clark is a 29 y.o. male with disseminated MSSA infection,  Principal Problem:   Endocarditis Active Problems:   IVDU (intravenous drug user)   Sepsis (HCC)   Thrombocytopenia (HCC)   Hypokalemia   Hyponatremia   MRSA bacteremia   Malnutrition of moderate degree   Threatening behavior   Hand abscess   Elevated BUN    Subjective: Underwent TEE - confirmed TV veg 1.5 x 2 cm , and trace pulmonic valve veg.  Had low grade fever in the last 24hr. Still asymptomatic sinus tach.  Medications:  . Chlorhexidine Gluconate Cloth  6 each Topical Q0600  . docusate sodium  100 mg Oral BID  . enoxaparin (LOVENOX) injection  40 mg Subcutaneous Q24H  . mouth rinse  15 mL Mouth Rinse BID  . multivitamin with minerals  1 tablet Oral Daily  . Ensure Max Protein  11 oz Oral Daily  . sodium chloride flush  3 mL Intravenous Q12H    Objective: Vital signs in last 24 hours: Temp:  [97.7 F (36.5 C)-102 F (38.9 C)] 98.1 F (36.7 C) (07/16 1149) Pulse Rate:  [105-135] 113 (07/16 1220) Resp:  [10-36] 31 (07/16 1220) BP: (126-151)/(75-109) 136/81 (07/16 1217) SpO2:  [90 %-98 %] 94 % (07/16 1220) Physical Exam  Constitutional: He is oriented to person, but sleepy. He appears well-developed and well-nourished. No distress.  HENT:  Mouth/Throat: Oropharynx is clear and moist. No oropharyngeal exudate.  Cardiovascular: tachycardia, regular rhythm and normal heart sounds. Exam reveals no gallop and no friction rub.  No murmur heard.  Pulmonary/Chest: Effort normal and breath sounds normal. No respiratory distress. He has no wheezes.  Abdominal: Soft. Bowel sounds are normal. He exhibits no distension. There is no tenderness.  Lymphadenopathy:  He has no cervical adenopathy.  Neurological: He is alert and oriented to person, place, and time.  Skin: Skin is warm and  dry. No rash noted. No erythema.      Lab Results Recent Labs    05/05/20 0217 05/05/20 0217 05/06/20 0220 05/06/20 1107  WBC 16.5*  --  13.8*  --   HGB 7.4*   < > 6.8* 8.8*  HCT 23.0*   < > 21.3* 26.0*  NA 134*   < > 135 138  K 4.5   < > 4.7 4.6  CL 104   < > 106 102  CO2 24  --  25  --   BUN 24*   < > 17 15  CREATININE 0.73   < > 0.65 0.60*   < > = values in this interval not displayed.   Liver Panel Recent Labs    05/05/20 0217 05/06/20 0220  PROT 5.3* 5.3*  ALBUMIN 1.5* 1.6*  AST 37 35  ALT 37 33  ALKPHOS 69 67  BILITOT 0.6 0.4    Microbiology: 7/14 blood cx ngtd Studies/Results: ECHO TEE  Result Date: 05/06/2020    TRANSESOPHOGEAL ECHO REPORT   Patient Name:   Clifford Clark Date of Exam: 05/06/2020 Medical Rec #:  [id number]     Height:       67.0 in Accession #:    [id number]    Weight:       121.3 lb Date of Birth:  November 25, 1990     BSA:  1.635 m Patient Age:    28 years      BP:           132/99 mmHg Patient Gender: M             HR:           107 bpm. Exam Location:  Inpatient Procedure: Transesophageal Echo, 3D Echo, Color Doppler and Cardiac Doppler Indications:     Endocarditis  History:         Patient has prior history of Echocardiogram examinations, most                  recent 04/29/2020. IVDU.  Sonographer:     Irving Burton Senior RDCS Referring Phys:  [id number] Azucena Fallen Diagnosing Phys: Zoila Shutter MD PROCEDURE: After discussion of the risks and benefits of a TEE, an informed consent was obtained from the patient. The transesophogeal probe was passed without difficulty through the esophogus of the patient. Local oropharyngeal anesthetic was provided with Cetacaine. Sedation performed by different physician. The patient was monitored while under deep sedation. Anesthestetic sedation was provided intravenously by Anesthesiology: 216mg  of Propofol. The patient developed no complications during the procedure. IMPRESSIONS  1. Left ventricular ejection  fraction, by estimation, is 60 to 65%. The left ventricle has normal function. The left ventricle has no regional wall motion abnormalities.  2. Right ventricular systolic function is normal. The right ventricular size is normal.  3. No left atrial/left atrial appendage thrombus was detected.  4. Small to moderate sized pericardial effusion. The pericardial effusion is circumferential. There is no evidence of cardiac tamponade.  5. The mitral valve is grossly normal. Trivial mitral valve regurgitation.  6. Large 1.5 x 2 cm mobile, filamentous vegetation. The tricuspid valve is abnormal. Tricuspid valve regurgitation is severe.  7. The aortic valve is tricuspid. Aortic valve regurgitation is not visualized.  8. The pulmonic valve was abnormal.  9. Small flickering leaflet mass, suggestive of vegetation of the pulmonic valve. Trivial PI. 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 Recommend CT surgery evaluation - consideration of angiojet debulking given severe TR and large size of vegetation. FINDINGS  Left Ventricle: Left ventricular ejection fraction, by estimation, is 60 to 65%. The left ventricle has normal function. The left ventricle has no regional wall motion abnormalities. The left ventricular internal cavity size was normal in size. There is  no left ventricular hypertrophy. Right Ventricle: The right ventricular size is normal. No increase in right ventricular wall thickness. Right ventricular systolic function is normal. Left Atrium: Left atrial size was normal in size. No left atrial/left atrial appendage thrombus was detected. Right Atrium: Right atrial size was normal in size. Pericardium: Small to moderate sized pericardial effusion. The pericardial effusion is circumferential. There is no evidence of cardiac tamponade. Mitral Valve: The mitral valve is grossly normal. Trivial mitral valve regurgitation. Tricuspid Valve: Large 1.5  x 2 cm mobile, filamentous vegetation. The tricuspid valve is abnormal. Tricuspid valve regurgitation is severe. Aortic Valve: The aortic valve is tricuspid. Aortic valve regurgitation is not visualized. Pulmonic Valve: The pulmonic valve was abnormal. Pulmonic valve regurgitation is trivial. Aorta: The aortic root and ascending aorta are structurally normal, with no evidence of dilitation. Pulmonary Artery: Small flickering leaflet mass, suggestive of vegetation of the pulmonic valve. Trivial PI. Venous: The pulmonary veins were not well visualized. IAS/Shunts: No atrial level shunt detected by color flow Doppler. Agitated saline contrast was given intravenously to evaluate for intracardiac shunting.  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 Spoke with dr Natale Milch who arranged for patient to be evaluated by CT surgery, tentative angiovac of TV veg on Tuesday at [facility].  Fever = likely from overall burden. Recommend repeat cxr to see if any effusion that may need to be drained  Chronic hep c = no need for treatment  Sinus tachycardia = appears asymptomatic, could be from withdrawal. Continue on telemetry. Patient has small pericardial effusion on echo  Dr Orvan Falconer to see on monday  [facility] for Infectious Diseases Cell: [phone] Pager: [phone]  05/06/2020, 2:41 PM

## 2020-05-06 NOTE — Progress Notes (Signed)
     [address]       [name] [address]             [phone]       Full consult note to follow. Images and chart reviewed, and plan was discussed with the primary team. This is a 29 year old gentleman with a history of IV drug abuse that is admitted with tricuspid valve endocarditis.  A TEE was performed today which showed a large mobile vegetation on the tricuspid valve.  He also has severe tricuspid valve regurgitation.  On further assessment the vegetation is mostly on the atrial side, and this would be amenable to catheter-based debridement.  Once transferred to [facility] for full evaluation will be performed, and he is tentatively scheduled for 05/18/2020 for angio VAC debridement.  Vahe Pienta Keane Scrape

## 2020-05-06 NOTE — Progress Notes (Signed)
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 homeless, asthma who presented to the ER after being found on the ground sleeping by a friend he stated.  EMS was called and he was transported to the hospital. In the ER with multiple imaging studies including x-ray of his right hand and wrist, x-ray of left hand, CXR, CT chest/abdomen/pelvis, CT C-spine, and CT head.  There were no acute fractures in his bilateral upper extremities.  CT head also unremarkable.  Remainder of his imaging studies revealed what appeared to be septic pulmonary emboli as well as septic emboli involving both kidneys. Cultures were drawn and he was started on vancomycin, cefepime, and Flagyl in the ER - 4/4 blood cultures were growing MRSA. TTE also performed on 7/9 revealing TV vegetation. Became verbally abusive/threatening 05/01/20 and Psych was consulted and a sitter was requested.  7/12 Patient developed worsening swelling in his right wrist and left hand. There was concern for septic joint or bursitis. A CT was ordered bilaterally which did show bilateral abscesses.  7/13 Bilateral I&D complaining of pain - admits to taking Carfentanyl at home/off the street.  7/15 planned TEE scratched due to patient taking p.o. despite being told not to overnight, rescheduled for the 16th   Interval History/ROS:  No acute issues or events overnight. Denies nausea, vomiting, diarrhea, constipation, headache, fevers, chills.   Assessment & Plan: Principal Problem:   Endocarditis Active Problems:   IVDU (intravenous drug user)   Sepsis (HCC)   Thrombocytopenia (HCC)   Hypokalemia   Hyponatremia   MRSA bacteremia   Malnutrition of moderate degree   Threatening behavior   Hand abscess   Elevated BUN   MSSA  bacteremia with concurrent endocarditis in the setting of IV drug abuse - Febrile late yesterday for the first time in days (prior to antibiotic de-escalation) likely 2/2 infection burden - no new complaints/issues - TTE performed on 04/29/20: TV vegetation (14mm) noted as well as possible veg on PV - TEE shows large mobile vegetation on tricuspid valve and small vegetation on pulmonic valve with no PFO - spoke with CT surgery office for possible angiovac evaluation - MRI T/L spine negative for acute findings - ID following ppreciate insight/recommendations - will need minimum 6 weeks IV antibiotics - changed to cefazolin 05/05/20 - Blood cultures finalized over the past 24h now showing MSSA (previously labeled MRSA on BCID in error) Trend BC until clear (7/10 and 7/14 cultures remain NGTD) prior to PICC placement; patient will need to remain in hospital for duration of treatment course; no indication for IVC if tries to leave AMA but PICC will have to be removed - He is high risk for leaving AMA and for worsening infection and/or death if he does; called and spoke with his mom Clifford Clark on 04/29/20 and updated her as well as told her all this as well  Hand abscess s/p I&D - Likely secondary to above - Bilateral abscesses on hands, s/p CT on 7/11 - Appreciate hand surgery consult; I&D on 7/13  Sepsis (HCC) secondary to above - See MRSA bacteremia  Acute metabolic encephalopathy, resolved - Considered due to IV drugs as well as sepsis/infection - Mentation has cleared and diet started   Anemia, likely  multifactorial Acute on chronic -Iron deficiency notable on labs, cannot rule out malnutrition or anemia of chronic disease concurrently.  -Hemoglobin downtrending overnight 1 unit PRBC ordered overnight CBC Latest Ref Rng & Units 05/06/2020 05/06/2020 05/05/2020  WBC 4.0 - 10.5 K/uL - 13.8(H) 16.5(H)  Hemoglobin 13.0 - 17.0 g/dL 6.3(J) 6.8(LL) 7.4(L)  Hematocrit 39 - 52 % 26.0(L) 21.3(L) 23.0(L)    Platelets 150 - 400 K/uL - 214 221   Thrombocytopenia (HCC) - Likely due to acute illness/sepsis; cannot rule out other etiologies yet - Trend CBC with infection treatment; slowly improving   Hyponatremia - Replete and recheck as needed  Elevated LFTs - Possibly due to current infection/sepsis vs risk for hepatitis from IV drug use - Hepatitis panel noted for hep C Ab positive - Check HCV Quant: Low/undetectable - LFTs improving with infection treatment  Hypokalemia -Replete and recheck as needed  Malnutrition of moderate degree Per RD: "Moderate Malnutrition related to social / environmental circumstances as evidenced by energy intake < 75% for > or equal to 3 months, moderate fat depletion, moderate muscle depletion, percent weight loss." - diet advanced on 7/10   Threatening behavior - on 7/11, patient endorsed multiple homicidal ideations to nurse and CNA in room; he threatened that he was going "to kill the attending" and all their family and kids; this was voiced several times despite nursing attempting to re-direct patient - psych eval requested at that time - no evidence of imminent risk to self/others  AKI, Resolved - BUN now downtrending appropriately; creatinine stable - Continue IVF - LR @ 75 while poorly tolerating PO Lab Results  Component Value Date   CREATININE 0.65 05/06/2020   CREATININE 0.73 05/05/2020   CREATININE 0.88 05/04/2020    Antimicrobials: Vanc 04/28/20 >> present Cefepime 04/28/20 - 04/29/20 Flagyl 04/28/20 - 04/29/20  DVT prophylaxis: Lovenox Code Status: Full Family Communication: Mother Disposition Plan:  Status is: Inpatient  Remains inpatient appropriate because:Ongoing active pain requiring inpatient pain management, Unsafe d/c plan, IV treatments appropriate due to intensity of illness or inability to take PO and Inpatient level of care appropriate due to severity of illness   Dispo: The patient is from: Home              Anticipated d/c  is to: Home              Anticipated d/c date is: > 3 days              Patient currently is not medically stable to d/c.  Objective: Blood pressure (!) 143/103, pulse (!) 116, temperature 99 F (37.2 C), temperature source Oral, resp. rate 16, height 5\' 7"  (1.702 m), weight 55 kg, SpO2 92 %.   Intake/Output Summary (Last 24 hours) at 05/06/2020 0716 Last data filed at 05/06/2020 0600 Gross per 24 hour  Intake 2288.04 ml  Output 2450 ml  Net -161.96 ml   Last Weight  Most recent update: 04/28/2020  9:43 PM   Weight  55 kg (121 lb 4.1 oz)           Examination: General:  Pleasantly resting in bed, No acute distress. HEENT:  Normocephalic atraumatic.  Sclerae nonicteric, noninjected.  Extraocular movements intact bilaterally. Neck:  Without mass or deformity.  Trachea is midline. Lungs:  Clear to auscultate bilaterally without rhonchi, wheeze, or rales. Heart:  Regular rate and rhythm.  Without murmurs, rubs, or gallops. Abdomen:  Soft, nontender, nondistended.  Without guarding or rebound. Extremities/skin: Do not apprecaite  any splinter hemorrhages or Osler notes/Janeway; bilateral wrists bandages clean dry intact  Neurologic: Grossly normal   Consultants:   ID  Hand surgery  Procedures:   TTE 04/29/2020  Data Reviewed: I have personally reviewed following labs and imaging studies Results for orders placed or performed during the hospital encounter of 04/28/20 (from the past 24 hour(s))  ABO/Rh     Status: None   Collection Time: 05/06/20  2:19 AM  Result Value Ref Range   ABO/RH(D)      O POS Performed at [facility], [address], [place], [place] [address]   CBC     Status: Abnormal   Collection Time: 05/06/20  2:20 AM  Result Value Ref Range   WBC 13.8 (H) 4.0 - 10.5 K/uL   RBC 2.47 (L) 4.22 - 5.81 MIL/uL   Hemoglobin 6.8 (LL) 13.0 - 17.0 g/dL   HCT 60.4 (L) 39 - 52 %   MCV 86.2 80.0 - 100.0 fL   MCH 27.5 26.0 - 34.0 pg   MCHC 31.9 30.0 -  36.0 g/dL   RDW 54.0 98.1 - 19.1 %   Platelets 214 150 - 400 K/uL   nRBC 0.0 0.0 - 0.2 %  Comprehensive metabolic panel     Status: Abnormal   Collection Time: 05/06/20  2:20 AM  Result Value Ref Range   Sodium 135 135 - 145 mmol/L   Potassium 4.7 3.5 - 5.1 mmol/L   Chloride 106 98 - 111 mmol/L   CO2 25 22 - 32 mmol/L   Glucose, Bld 94 70 - 99 mg/dL   BUN 17 6 - 20 mg/dL   Creatinine, Ser 4.78 0.61 - 1.24 mg/dL   Calcium 7.6 (L) 8.9 - 10.3 mg/dL   Total Protein 5.3 (L) 6.5 - 8.1 g/dL   Albumin 1.6 (L) 3.5 - 5.0 g/dL   AST 35 15 - 41 U/L   ALT 33 0 - 44 U/L   Alkaline Phosphatase 67 38 - 126 U/L   Total Bilirubin 0.4 0.3 - 1.2 mg/dL   GFR calc non Af Amer >60 >60 mL/min   GFR calc Af Amer >60 >60 mL/min   Anion gap 4 (L) 5 - 15  Vitamin B12     Status: Abnormal   Collection Time: 05/06/20  2:20 AM  Result Value Ref Range   Vitamin B-12 1,111 (H) 180 - 914 pg/mL  Folate     Status: None   Collection Time: 05/06/20  2:20 AM  Result Value Ref Range   Folate 7.8 >5.9 ng/mL  Iron and TIBC     Status: Abnormal   Collection Time: 05/06/20  2:20 AM  Result Value Ref Range   Iron 21 (L) 45 - 182 ug/dL   TIBC 295 (L) 621 - 308 ug/dL   Saturation Ratios 13 (L) 17.9 - 39.5 %   UIBC 135 ug/dL  Ferritin     Status: Abnormal   Collection Time: 05/06/20  2:20 AM  Result Value Ref Range   Ferritin 507 (H) 24 - 336 ng/mL  Reticulocytes     Status: Abnormal   Collection Time: 05/06/20  2:20 AM  Result Value Ref Range   Retic Ct Pct 2.1 0.4 - 3.1 %   RBC. 2.41 (L) 4.22 - 5.81 MIL/uL   Retic Count, Absolute 50.9 19.0 - 186.0 K/uL   Immature Retic Fract 14.2 2.3 - 15.9 %  Prepare RBC (crossmatch)     Status: None   Collection  Time: 05/06/20  4:26 AM  Result Value Ref Range   Order Confirmation      ORDER PROCESSED BY BLOOD BANK Performed at [facility], [address] [place], [place] [address]   Type and screen [facility]     Status: None  (Preliminary result)   Collection Time: 05/06/20  4:26 AM  Result Value Ref Range   ABO/RH(D) O POS    Antibody Screen NEG    Sample Expiration 05/09/2020,2359    Unit Number [id number]    Blood Component Type RED CELLS,LR    Unit division 00    Status of Unit ISSUED    Transfusion Status OK TO TRANSFUSE    Crossmatch Result      Compatible Performed at [facility], [address] [place], [place] [address]     Recent Results (from the past 240 hour(s))  Culture, blood (routine x 2)     Status: Abnormal   Collection Time: 04/28/20  3:45 PM   Specimen: BLOOD  Result Value Ref Range Status   Specimen Description   Final    BLOOD LEFT ANTECUBITAL Performed at [facility], [address] [place], [place] [address]    Special Requests   Final    BOTTLES DRAWN AEROBIC AND ANAEROBIC Blood Culture adequate volume Performed at [facility], [address], [place], [place] [address]    Culture  Setup Time   Final    GRAM POSITIVE COCCI IN BOTH AEROBIC AND ANAEROBIC BOTTLES CRITICAL RESULT CALLED TO, READ BACK BY AND VERIFIED WITH: PHARMD M. Derrick Ravel [phone] FCP    Culture (A)  Final    STAPHYLOCOCCUS AUREUS METHICILLIN RESISTANT ORGANISM WAS NOT RECOVERED IN CULTURE. REPEATED FOR CONFIRMATION Performed at [facility], [address] [place], [place] [address]    Report Status 05/03/2020 FINAL  Final   Organism ID, Bacteria STAPHYLOCOCCUS AUREUS  Final      Susceptibility   Staphylococcus aureus - MIC*    CIPROFLOXACIN <=0.5 SENSITIVE Sensitive     ERYTHROMYCIN >=8 RESISTANT Resistant     GENTAMICIN <=0.5 SENSITIVE Sensitive     OXACILLIN 0.5 SENSITIVE Sensitive     TETRACYCLINE <=1 SENSITIVE Sensitive     VANCOMYCIN <=0.5 SENSITIVE Sensitive     TRIMETH/SULFA <=10 SENSITIVE Sensitive     CLINDAMYCIN <=0.25 SENSITIVE Sensitive     RIFAMPIN <=0.5 SENSITIVE Sensitive     Inducible Clindamycin NEGATIVE  Sensitive     * STAPHYLOCOCCUS AUREUS  Blood Culture ID Panel (Reflexed)     Status: Abnormal   Collection Time: 04/28/20  3:45 PM  Result Value Ref Range Status   Enterococcus species NOT DETECTED NOT DETECTED Final   Listeria monocytogenes NOT DETECTED NOT DETECTED Final   Staphylococcus species DETECTED (A) NOT DETECTED Final    Comment: CRITICAL RESULT CALLED TO, READ BACK BY AND VERIFIED WITH: PHARMD Lynder Parents [phone] FCP    Staphylococcus aureus (BCID) DETECTED (A) NOT DETECTED Final    Comment: Methicillin (oxacillin)-resistant Staphylococcus aureus (MRSA). MRSA is predictably resistant to beta-lactam antibiotics (except ceftaroline). Preferred therapy is vancomycin unless clinically contraindicated. Patient requires contact precautions if  hospitalized. CRITICAL RESULT CALLED TO, READ BACK BY AND VERIFIED WITH: PHARMD M. Derrick Ravel [phone] FCP    Methicillin resistance DETECTED (A) NOT DETECTED Final    Comment: CRITICAL RESULT CALLED TO, READ BACK BY AND VERIFIED WITH: PHARMD Lynder Parents [phone] FCP  Streptococcus species NOT DETECTED NOT DETECTED Final   Streptococcus agalactiae NOT DETECTED NOT DETECTED Final   Streptococcus pneumoniae NOT DETECTED NOT DETECTED Final   Streptococcus pyogenes NOT DETECTED NOT DETECTED Final   Acinetobacter baumannii NOT DETECTED NOT DETECTED Final   Enterobacteriaceae species NOT DETECTED NOT DETECTED Final   Enterobacter cloacae complex NOT DETECTED NOT DETECTED Final   Escherichia coli NOT DETECTED NOT DETECTED Final   Klebsiella oxytoca NOT DETECTED NOT DETECTED Final   Klebsiella pneumoniae NOT DETECTED NOT DETECTED Final   Proteus species NOT DETECTED NOT DETECTED Final   Serratia marcescens NOT DETECTED NOT DETECTED Final   Haemophilus influenzae NOT DETECTED NOT DETECTED Final   Neisseria meningitidis NOT DETECTED NOT DETECTED Final   Pseudomonas aeruginosa NOT DETECTED NOT DETECTED Final   Candida albicans NOT DETECTED NOT  DETECTED Final   Candida glabrata NOT DETECTED NOT DETECTED Final   Candida krusei NOT DETECTED NOT DETECTED Final   Candida parapsilosis NOT DETECTED NOT DETECTED Final   Candida tropicalis NOT DETECTED NOT DETECTED Final    Comment: Performed at [facility], [address] [place], [place] [address]  Culture, blood (routine x 2)     Status: Abnormal   Collection Time: 04/28/20  4:13 PM   Specimen: BLOOD  Result Value Ref Range Status   Specimen Description   Final    BLOOD RIGHT ANTECUBITAL Performed at [facility], [address] [place], [place] [address]    Special Requests   Final    BOTTLES DRAWN AEROBIC AND ANAEROBIC Blood Culture adequate volume Performed at [facility], [address] [place], [place] [address]    Culture  Setup Time   Final    GRAM POSITIVE COCCI IN BOTH AEROBIC AND ANAEROBIC BOTTLES CRITICAL VALUE NOTED.  VALUE IS CONSISTENT WITH PREVIOUSLY REPORTED AND CALLED VALUE.    Culture (A)  Final    STAPHYLOCOCCUS AUREUS METHICILLIN RESISTANT ORGANISM WAS NOT RECOVERED IN CULTURE. REPEATED FOR CONFIRMATION Performed at [facility], [address], [place], [place] [address]    Report Status 05/03/2020 FINAL  Final   Organism ID, Bacteria STAPHYLOCOCCUS AUREUS  Final      Susceptibility   Staphylococcus aureus - MIC*    CIPROFLOXACIN <=0.5 SENSITIVE Sensitive     ERYTHROMYCIN >=8 RESISTANT Resistant     GENTAMICIN <=0.5 SENSITIVE Sensitive     OXACILLIN 0.5 SENSITIVE Sensitive     TETRACYCLINE <=1 SENSITIVE Sensitive     VANCOMYCIN <=0.5 SENSITIVE Sensitive     TRIMETH/SULFA <=10 SENSITIVE Sensitive     CLINDAMYCIN <=0.25 SENSITIVE Sensitive     RIFAMPIN <=0.5 SENSITIVE Sensitive     Inducible Clindamycin NEGATIVE Sensitive     * STAPHYLOCOCCUS AUREUS  Urine culture     Status: Abnormal   Collection Time: 04/28/20  7:00 PM   Specimen: In/Out Cath Urine  Result Value Ref Range Status   Specimen  Description   Final    IN/OUT CATH URINE Performed at [facility], [address] [place], [place] [address]    Special Requests   Final    NONE Performed at [facility], [address] [place], [place] [address]    Culture >=100,000 COLONIES/mL STAPHYLOCOCCUS AUREUS (A)  Final   Report Status 05/01/2020 FINAL  Final   Organism ID, Bacteria STAPHYLOCOCCUS AUREUS (A)  Final      Susceptibility   Staphylococcus aureus - MIC*    CIPROFLOXACIN <=0.5  SENSITIVE Sensitive     GENTAMICIN <=0.5 SENSITIVE Sensitive     NITROFURANTOIN <=16 SENSITIVE Sensitive     OXACILLIN 0.5 SENSITIVE Sensitive     TETRACYCLINE <=1 SENSITIVE Sensitive     VANCOMYCIN <=0.5 SENSITIVE Sensitive     TRIMETH/SULFA <=10 SENSITIVE Sensitive     CLINDAMYCIN <=0.25 SENSITIVE Sensitive     RIFAMPIN <=0.5 SENSITIVE Sensitive     Inducible Clindamycin NEGATIVE Sensitive     * >=100,000 COLONIES/mL STAPHYLOCOCCUS AUREUS  SARS Coronavirus 2 by RT PCR (hospital order, performed in [facility] hospital lab) Nasopharyngeal Nasopharyngeal Swab     Status: None   Collection Time: 04/28/20  7:03 PM   Specimen: Nasopharyngeal Swab  Result Value Ref Range Status   SARS Coronavirus 2 NEGATIVE NEGATIVE Final    Comment: (NOTE) SARS-CoV-2 target nucleic acids are NOT DETECTED.  The SARS-CoV-2 RNA is generally detectable in upper and lower respiratory specimens during the acute phase of infection. The lowest concentration of SARS-CoV-2 viral copies this assay can detect is 250 copies / mL. A negative result does not preclude SARS-CoV-2 infection and should not be used as the sole basis for treatment or other patient management decisions.  A negative result may occur with improper specimen collection / handling, submission of specimen other than nasopharyngeal swab, presence of viral mutation(s) within the areas targeted by this assay, and inadequate number of viral copies (<250 copies  / mL). A negative result must be combined with clinical observations, patient history, and epidemiological information.  Fact Sheet for Patients:   BoilerBrush.com.cyhttps://www.fda.gov/media/136312/download  Fact Sheet for Healthcare Providers: https://pope.com/https://www.fda.gov/media/136313/download  This test is not yet approved or  cleared by the [place] FDA and has been authorized for detection and/or diagnosis of SARS-CoV-2 by FDA under an Emergency Use Authorization (EUA).  This EUA will remain in effect (meaning this test can be used) for the duration of the COVID-19 declaration under Section 564(b)(1) of the Act, 21 U.S.C. section 360bbb-3(b)(1), unless the authorization is terminated or revoked sooner.  Performed at [facility], [address] [place], [place] [address]   MRSA PCR Screening     Status: Abnormal   Collection Time: 04/28/20  7:45 PM   Specimen: Nasal Mucosa; Nasopharyngeal  Result Value Ref Range Status   MRSA by PCR POSITIVE (A) NEGATIVE Final    Comment:        The GeneXpert MRSA Assay (FDA approved for NASAL specimens only), is one component of a comprehensive MRSA colonization surveillance program. It is not intended to diagnose MRSA infection nor to guide or monitor treatment for MRSA infections. RESULT CALLED TO, READ BACK BY AND VERIFIED WITH: S SMITH AT 2314 ON 04/28/2020 BY MOSLEY,J  Performed at [facility], [address] [place], [place] [address]   Culture, blood (Routine X 2) w Reflex to ID Panel     Status: None   Collection Time: 04/30/20  8:15 AM   Specimen: BLOOD  Result Value Ref Range Status   Specimen Description   Final    BLOOD LEFT ANTECUBITAL Performed at [facility], [address] [place], [place] [address]    Special Requests   Final    BOTTLES DRAWN AEROBIC AND ANAEROBIC Blood Culture adequate volume Performed at [facility], [address] [place], [place] [address]     Culture   Final    NO GROWTH 5 DAYS Performed at [facility], [address] [place],  [place] [address]    Report Status 05/05/2020 FINAL  Final  Culture, blood (Routine X 2) w Reflex to ID Panel     Status: None   Collection Time: 04/30/20  8:15 AM   Specimen: BLOOD LEFT FOREARM  Result Value Ref Range Status   Specimen Description   Final    BLOOD LEFT FOREARM Performed at [facility], [address], [place], [place] [address]    Special Requests   Final    BOTTLES DRAWN AEROBIC ONLY Blood Culture adequate volume Performed at [facility], [address], [place], [place] [address]    Culture   Final    NO GROWTH 5 DAYS Performed at [facility], [address] [place], [place] [address]    Report Status 05/05/2020 FINAL  Final  Fungus Culture With Stain     Status: None (Preliminary result)   Collection Time: 05/03/20  9:35 AM   Specimen: Soft Tissue, Other  Result Value Ref Range Status   Fungus Stain Final report  Final    Comment: (NOTE) Performed At: [facility] [address] [place], [place] [id number] Jolene Schimke MD [id number]    Fungus (Mycology) Culture PENDING  Incomplete   Fungal Source TISSUE  Final    Comment: RT WRIST Performed at [facility], [address], [place], [place] [address]   Aerobic/Anaerobic Culture (surgical/deep wound)     Status: None (Preliminary result)   Collection Time: 05/03/20  9:35 AM   Specimen: Soft Tissue, Other  Result Value Ref Range Status   Specimen Description   Final    TISSUE RT WRIST Performed at [facility], [address], [place], [place] [address]    Special Requests   Final    PODIATRY [place] Performed at [facility], [address], [place], [place] [address]    Gram Stain   Final    RARE WBC PRESENT,BOTH PMN AND MONONUCLEAR RARE GRAM POSITIVE COCCI IN PAIRS IN  CLUSTERS Performed at [facility], [address] [place], [place] [address]    Culture   Final    RARE STAPHYLOCOCCUS AUREUS NO ANAEROBES ISOLATED; CULTURE IN PROGRESS FOR 5 DAYS    Report Status PENDING  Incomplete   Organism ID, Bacteria STAPHYLOCOCCUS AUREUS  Final      Susceptibility   Staphylococcus aureus - MIC*    CIPROFLOXACIN <=0.5 SENSITIVE Sensitive     ERYTHROMYCIN >=8 RESISTANT Resistant     GENTAMICIN <=0.5 SENSITIVE Sensitive     OXACILLIN 0.5 SENSITIVE Sensitive     TETRACYCLINE <=1 SENSITIVE Sensitive     VANCOMYCIN <=0.5 SENSITIVE Sensitive     TRIMETH/SULFA <=10 SENSITIVE Sensitive     CLINDAMYCIN <=0.25 SENSITIVE Sensitive     RIFAMPIN <=0.5 SENSITIVE Sensitive     Inducible Clindamycin NEGATIVE Sensitive     * RARE STAPHYLOCOCCUS AUREUS  Fungus Culture Result     Status: None   Collection Time: 05/03/20  9:35 AM  Result Value Ref Range Status   Result 1 Comment  Final    Comment: (NOTE) KOH/Calcofluor preparation:  no fungus observed. Performed At: [facility] [address] [place], [place] [id number] Jolene Schimke MD [id number]   Fungus Culture With Stain     Status: None (Preliminary result)   Collection Time: 05/03/20  9:38 AM   Specimen: Soft Tissue, Other  Result Value Ref Range Status   Fungus Stain Final report  Final    Comment: (NOTE) Performed At: [facility] [address] [place], [place] [id number] Jolene Schimke MD [id number]    Fungus (Mycology) Culture PENDING  Incomplete   Fungal Source TISSUE  Final    Comment: LT HAND Performed at [facility], [address], [place], [place] [address]   Aerobic/Anaerobic Culture (surgical/deep wound)     Status: None (Preliminary result)   Collection Time: 05/03/20  9:38 AM   Specimen: Soft Tissue, Other  Result Value Ref Range Status   Specimen Description   Final    TISSUE LT HAND Performed at [facility], [address], [place], [place] [address]    Special Requests   Final    PATIENT ON FOLLOWING ANCEF, [facility] Performed at [facility], [address] [place], [place] [address]    Gram Stain   Final    NO WBC SEEN RARE GRAM POSITIVE COCCI IN PAIRS IN CLUSTERS Performed at [facility], [address] [place], [place] [address]    Culture   Final    FEW STAPHYLOCOCCUS AUREUS NO ANAEROBES ISOLATED; CULTURE IN PROGRESS FOR 5 DAYS    Report Status PENDING  Incomplete   Organism ID, Bacteria STAPHYLOCOCCUS AUREUS  Final      Susceptibility   Staphylococcus aureus - MIC*    CIPROFLOXACIN <=0.5 SENSITIVE Sensitive     ERYTHROMYCIN >=8 RESISTANT Resistant     GENTAMICIN <=0.5 SENSITIVE Sensitive     OXACILLIN 0.5 SENSITIVE Sensitive     TETRACYCLINE <=1 SENSITIVE Sensitive     VANCOMYCIN 1 SENSITIVE Sensitive     TRIMETH/SULFA <=10 SENSITIVE Sensitive     CLINDAMYCIN <=0.25 SENSITIVE Sensitive     RIFAMPIN <=0.5 SENSITIVE Sensitive     Inducible Clindamycin NEGATIVE Sensitive     * FEW STAPHYLOCOCCUS AUREUS  Fungus Culture Result     Status: None   Collection Time: 05/03/20  9:38 AM  Result Value Ref Range Status   Result 1 Comment  Final    Comment: (NOTE) KOH/Calcofluor preparation:  no fungus observed. Performed At: [facility] [address] [place], [place] [id number] Jolene Schimke MD UU:[id number]   Culture, blood (Routine X 2) w Reflex to ID Panel     Status: None (Preliminary result)   Collection Time: 05/04/20  5:19 PM   Specimen: BLOOD  Result Value Ref Range Status   Specimen Description   Final    BLOOD RIGHT ANTECUBITAL Performed at [facility], [address], [place], [place] [address]    Special Requests   Final    BOTTLES DRAWN AEROBIC AND ANAEROBIC Blood Culture adequate volume Performed at [facility], [address], [place], [place] [address]    Culture   Final    NO GROWTH < 24  HOURS Performed at [facility], [address], [place], [place] [address]    Report Status PENDING  Incomplete  Culture, blood (Routine X 2) w Reflex to ID Panel     Status: None (Preliminary result)   Collection Time: 05/04/20  5:19 PM   Specimen: BLOOD  Result Value Ref Range Status   Specimen Description   Final    BLOOD LEFT ANTECUBITAL Performed at [facility], [address] [place], [place] [address]    Special Requests   Final    BOTTLES DRAWN AEROBIC ONLY Blood Culture adequate volume Performed at [facility], [address], [place], [place] [address]    Culture   Final    NO GROWTH < 24 HOURS Performed at [facility], [address], [place], [place] [address]    Report Status PENDING  Incomplete     Radiology Studies: MR THORACIC SPINE W WO CONTRAST  Result Date: 05/04/2020 CLINICAL DATA:  IV drug use, endocarditis EXAM: MRI THORACIC AND LUMBAR SPINE WITHOUT AND WITH CONTRAST TECHNIQUE: Multiplanar and multiecho pulse sequences of the thoracic and lumbar spine were obtained without and with intravenous contrast. CONTRAST:  5mL GADAVIST GADOBUTROL 1 MMOL/ML IV SOLN COMPARISON:  None. FINDINGS: Motion artifact is present. MRI THORACIC SPINE Alignment:  Major posterior alignment is maintained. Vertebrae: Vertebral body heights are preserved. There is no marrow edema. Decreased T1 marrow signal probably reflects hematopoietic marrow. No suspicious osseous lesion. Cord: No definite abnormal signal within significant above limitation. No epidural collection. Paraspinal and other soft tissues: Incompletely evaluated bilateral nodular pulmonary opacities. Disc levels: There is no disc edema or enhancement. No degenerative stenosis. MRI LUMBAR SPINE Segmentation:  Standard. Alignment:  Anteroposterior alignment is maintained. Vertebrae: Vertebral body heights are preserved. There is no marrow edema. Decreased T1 marrow signal  probably reflects hematopoietic marrow. No suspicious osseous lesion. Conus medullaris: Extends to approximately the L1-L2 level and appears normal. No epidural collection. Paraspinal and other soft tissues: Unremarkable. Disc levels: Intervertebral disc heights and signal are maintained. No disc enhancement. There is no degenerative stenosis. IMPRESSION: No evidence of discitis/osteomyelitis or epidural collection. Electronically Signed   By: Guadlupe Spanish M.D.   On: 05/04/2020 11:11   MR Lumbar Spine W Wo Contrast  Result Date: 05/04/2020 CLINICAL DATA:  IV drug use, endocarditis EXAM: MRI THORACIC AND LUMBAR SPINE WITHOUT AND WITH CONTRAST TECHNIQUE: Multiplanar and multiecho pulse sequences of the thoracic and lumbar spine were obtained without and with intravenous contrast. CONTRAST:  5mL GADAVIST GADOBUTROL 1 MMOL/ML IV SOLN COMPARISON:  None. FINDINGS: Motion artifact is present. MRI THORACIC SPINE Alignment:  Major posterior alignment is maintained. Vertebrae: Vertebral body heights are preserved. There is no marrow edema. Decreased T1 marrow signal probably reflects hematopoietic marrow. No suspicious osseous lesion. Cord: No definite abnormal signal within significant above limitation. No epidural collection. Paraspinal and other soft tissues: Incompletely evaluated bilateral nodular pulmonary opacities. Disc levels: There is no disc edema or enhancement. No degenerative stenosis. MRI LUMBAR SPINE Segmentation:  Standard. Alignment:  Anteroposterior alignment is maintained. Vertebrae: Vertebral body heights are preserved. There is no marrow edema. Decreased T1 marrow signal probably reflects hematopoietic marrow. No suspicious osseous lesion. Conus medullaris: Extends to approximately the L1-L2 level and appears normal. No epidural collection. Paraspinal and other soft tissues: Unremarkable. Disc levels: Intervertebral disc heights and signal are maintained. No disc enhancement. There is no  degenerative stenosis. IMPRESSION: No evidence of discitis/osteomyelitis or epidural collection. Electronically Signed   By: Guadlupe Spanish M.D.   On: 05/04/2020 11:11   MR Lumbar Spine W Wo Contrast  Final Result    MR THORACIC SPINE W WO CONTRAST  Final Result    DG CHEST PORT 1 VIEW  Final Result    DG CHEST PORT 1 VIEW  Final Result    CT WRIST RIGHT W CONTRAST  Final Result    CT HAND LEFT W CONTRAST  Final Result    US Abdomen Limited RUQ  Final Result    CT Abdomen Pelvis W Contrast  Final Result    CT Head Wo Contrast  Final Result  CT Cervical Spine Wo Contrast  Final Result    CT Chest W Contrast  Final Result    DG Chest Port 1 View  Final Result    DG Wrist Complete Right  Final Result    DG Hand Complete Right  Final Result    DG Hand Complete Left  Final Result       Scheduled Meds:  Chlorhexidine Gluconate Cloth  6 each Topical Q0600   docusate sodium  100 mg Oral BID   enoxaparin (LOVENOX) injection  40 mg Subcutaneous Q24H   mouth rinse  15 mL Mouth Rinse BID   multivitamin with minerals  1 tablet Oral Daily   Ensure Max Protein  11 oz Oral Daily   sodium chloride flush  3 mL Intravenous Q12H   PRN Meds: acetaminophen **OR** acetaminophen, ipratropium-albuterol, LORazepam, morphine injection, ondansetron **OR** ondansetron (ZOFRAN) IV, oxyCODONE Continuous Infusions:  sodium chloride Stopped (05/05/20 0559)    ceFAZolin (ANCEF) IV Stopped (05/06/20 0557)   lactated ringers 75 mL/hr at 05/06/20 0600    LOS: 8 days  Time spent:  Azucena Fallen, DO [facility] 05/06/2020, 7:16 AM  Contact via secure chat.

## 2020-05-06 NOTE — Anesthesia Procedure Notes (Signed)
Procedure Name: MAC Date/Time: 05/06/2020 11:20 AM Performed by: Harden Mo, CRNA Pre-anesthesia Checklist: Patient identified, Emergency Drugs available, Suction available and Patient being monitored Patient Re-evaluated:Patient Re-evaluated prior to induction Oxygen Delivery Method: Nasal cannula Preoxygenation: Pre-oxygenation with 100% oxygen Induction Type: IV induction Placement Confirmation: positive ETCO2 and breath sounds checked- equal and bilateral Dental Injury: Teeth and Oropharynx as per pre-operative assessment

## 2020-05-06 NOTE — Anesthesia Postprocedure Evaluation (Signed)
Anesthesia Post Note  Patient: Clifford Clark  Procedure(s) Performed: TRANSESOPHAGEAL ECHOCARDIOGRAM (TEE) (N/A ) BUBBLE STUDY     Patient location during evaluation: PACU Anesthesia Type: MAC Level of consciousness: awake and alert Pain management: pain level controlled Vital Signs Assessment: post-procedure vital signs reviewed and stable Respiratory status: spontaneous breathing, nonlabored ventilation, respiratory function stable and patient connected to nasal cannula oxygen Cardiovascular status: stable and blood pressure returned to baseline Postop Assessment: no apparent nausea or vomiting Anesthetic complications: no   No complications documented.  Last Vitals:  Vitals:   05/06/20 1045 05/06/20 1149  BP: (!) 132/97 126/75  Pulse: (!) 118 (!) 111  Resp: (!) 34 (!) 35  Temp: (!) 38.1 C 36.7 C  SpO2: 94% 98%    Last Pain:  Vitals:   05/06/20 1149  TempSrc: Oral  PainSc: 0-No pain                 [name]

## 2020-05-06 NOTE — Transfer of Care (Signed)
Immediate Anesthesia Transfer of Care Note  Patient: Urban Naval  Procedure(s) Performed: TRANSESOPHAGEAL ECHOCARDIOGRAM (TEE) (N/A ) BUBBLE STUDY  Patient Location: Endoscopy Unit  Anesthesia Type:MAC  Level of Consciousness: drowsy and patient cooperative  Airway & Oxygen Therapy: Patient Spontanous Breathing and Patient connected to nasal cannula oxygen  Post-op Assessment: Report given to RN and Post -op Vital signs reviewed and stable  Post vital signs: Reviewed and stable  Last Vitals:  Vitals Value Taken Time  BP 126/75   Temp    Pulse 110   Resp 38   SpO2 100     Last Pain:  Vitals:   05/06/20 1045  TempSrc: Temporal  PainSc: 10-Worst pain ever      Patients Stated Pain Goal: 0 (16/83/72 9021)  Complications: No complications documented.

## 2020-05-07 DIAGNOSIS — A4101 Sepsis due to Methicillin susceptible Staphylococcus aureus: Secondary | ICD-10-CM

## 2020-05-07 NOTE — Progress Notes (Signed)
PROGRESS NOTE    Clifford Clark   YCX:[id number]  DOB: 1991-08-29  DOA: 04/28/2020     9  PCP: Patient, [name]  CC: found asleep on the ground  Hospital Course: 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 homeless, asthma who presented to the ER after being found on the ground sleeping by a friend he stated.  EMS was called and he was transported to the hospital. In the ER with multiple imaging studies including x-ray of his right hand and wrist, x-ray of left hand, CXR, CT chest/abdomen/pelvis, CT C-spine, and CT head.  There were no acute fractures in his bilateral upper extremities.  CT head also unremarkable.  Remainder of his imaging studies revealed what appeared to be septic pulmonary emboli as well as septic emboli involving both kidneys. Cultures were drawn and he was started on vancomycin, cefepime, and Flagyl in the ER - 4/4 blood cultures were growing MRSA. TTE also performed on 7/9 revealing TV vegetation. Became verbally abusive/threatening 05/01/20 and Psych was consulted and a sitter was requested.  7/12 Patient developed worsening swelling in his right wrist and left hand. There was concern for septic joint or bursitis. A CT was ordered bilaterally which did show bilateral abscesses.  7/13 Bilateral I&D complaining of pain - admits to taking Carfentanyl at home/off the street.  7/16 TEE shows large tricuspid vegetation - plan to transfer to [facility] for CT Sx to evaluate for angiovac 7/17 Tachycardia/tachypnea/Febrile overnight resolved with IV fluids and pain medication   Interval History/ROS:  Overnight patient had episode of tachypnea and moderately elevated fever, patient has remained tachycardic past few days in the setting of large tricuspid valvular vegetation.  Patient's only complaint continues to be uncontrolled diffuse "all over" body pain specific complaint.  Otherwise denies nausea, vomiting, diarrhea, constipation,  headache, chills.   Assessment & Plan: Principal Problem:   Endocarditis Active Problems:   IVDU (intravenous drug user)   Sepsis (HCC)   Thrombocytopenia (HCC)   Hypokalemia   Hyponatremia   MRSA bacteremia   Malnutrition of moderate degree   Threatening behavior   Hand abscess   Elevated BUN   MSSA bacteremia with concurrent endocarditis in the setting of IV drug abuse - Febrile again overnight, at 8pm patient has had moderately high-grade fever over the past 2 nights -improved last night after pain control and IV fluid challenge -Patient's left ankle appears to be somewhat swollen today as does his right hand distally, unclear if this is secondary to IV fluid challenge overnight versus worsening or possible new onset infection.  Follow clinically over the next 24 hours if these sites become erythematous or painful would consider further imaging for evaluation of possible infection. - TTE performed on 04/29/20: TV vegetation (65mm) noted as well as possible veg on PV - TEE shows large mobile vegetation on tricuspid valve and small vegetation on pulmonic valve with no PFO - spoke with CT surgery ossible angiovac at [facility] on 05/10/20 pending scheduling - MRI T/L spine negative for acute findings - ID following ppreciate insight/recommendations - will need minimum 6 weeks IV antibiotics - changed to cefazolin 05/05/20 - Blood cultures finalized over the past 48h now showing MSSA (previously labeled MRSA on BCID in error) Trend BC until clear (7/10 and 7/14 cultures remain NGTD) prior to PICC placement; patient will need to remain in hospital for duration of treatment course; no indication for IVC if tries  to leave AMA but PICC will have to be removed - He is high risk for leaving AMA and for worsening infection and/or death if he does; called and spoke with his mom [name] on 04/29/20 and updated her as well as told her all this as well  Hand abscess, bilateral, s/p I&D - Likely  secondary to above - Bilateral abscesses on hands, s/p CT on 7/11 - Appreciate hand surgery consult; I&D on 7/13 -Left hand edematous overnight, questionable secondary to fluid challenge overnight, follow clinically, keep elevated  Sepsis (HCC) secondary to above - See MSSA bacteremia  Acute metabolic encephalopathy, resolved - Considered due to IV drugs as well as sepsis/infection - Mentation has cleared and diet started   Anemia, likely multifactorial Acute (questionably blood loss versus dilutional) on chronic anemia of likely chronic disease -Iron deficiency notable on labs, cannot rule out malnutrition or anemia of chronic disease concurrently.  -Decrease lab draws to every 72 hours in hopes to avoid bloodletting the patient -Status post 1 unit PRBC (7/16) CBC Latest Ref Rng & Units 05/06/2020 05/06/2020 05/05/2020  WBC 4.0 - 10.5 K/uL - 13.8(H) 16.5(H)  Hemoglobin 13.0 - 17.0 g/dL 0.3(E) 6.8(LL) 7.4(L)  Hematocrit 39 - 52 % 26.0(L) 21.3(L) 23.0(L)  Platelets 150 - 400 K/uL - 214 221   Thrombocytopenia (HCC) - Likely due to acute illness/sepsis; cannot rule out other etiologies yet - Trend CBC with infection treatment; slowly improving   Hyponatremia - Replete and recheck as needed  Elevated LFTs - Possibly due to current infection/sepsis vs risk for hepatitis from IV drug use - Hepatitis panel noted for hep C Ab positive - Check HCV Quant: Low/undetectable - LFTs improving with infection treatment  Hypokalemia, resolved -Replete and recheck as needed Lab Results  Component Value Date   K 4.6 05/06/2020   Malnutrition of moderate degree Per RD: "Moderate Malnutrition related to social / environmental circumstances as evidenced by energy intake < 75% for > or equal to 3 months, moderate fat depletion, moderate muscle depletion, percent weight loss." - diet advanced on 7/10   Threatening behavior - on 7/11, patient endorsed multiple homicidal ideations to nurse and CNA  in room; he threatened that he was going "to kill the attending" and all their family and kids; this was voiced several times despite nursing attempting to re-direct patient - psych eval requested at that time - no evidence of imminent risk to self/others  AKI, Resolved - BUN now downtrending appropriately; creatinine stable - Continue IVF - LR @ 75 while poorly tolerating PO Lab Results  Component Value Date   CREATININE 0.60 (L) 05/06/2020   CREATININE 0.65 05/06/2020   CREATININE 0.73 05/05/2020    Antimicrobials: Cefazolin 05/05/2020>> ongoing Vanc 04/28/20 >> 7/15 Cefepime 04/28/20 - 04/29/20 Flagyl 04/28/20 - 04/29/20  DVT prophylaxis: Lovenox Code Status: Full Family Communication: Mother Disposition Plan:  Status is: Inpatient  Remains inpatient appropriate because:Ongoing active pain requiring inpatient pain management, Unsafe d/c plan, IV treatments appropriate due to intensity of illness or inability to take PO and Inpatient level of care appropriate due to severity of illness   Dispo: The patient is from: Home              Anticipated d/c is to: Home              Anticipated d/c date is: > 3 days              Patient currently is not medically stable to d/c.  Objective: Blood pressure (!) 149/92, pulse (!) 102, temperature 98.3 F (36.8 C), temperature source Oral, resp. rate (!) 28, height  (1.702 m), weight 55 kg, SpO2 92 %.   Intake/Output Summary (Last 24 hours) at 05/07/2020 0711 Last data filed at 05/07/2020 0600 Gross per 24 hour  Intake 3693.14 ml  Output 1725 ml  Net 1968.14 ml   Last Weight  Most recent update: 04/28/2020  9:43 PM   Weight  55 kg (121 lb 4.1 oz)           Examination: General:  Pleasantly resting in bed, No acute distress. HEENT:  Normocephalic atraumatic.  Sclerae nonicteric, noninjected.  Extraocular movements intact bilaterally. Neck:  Without mass or deformity.  Trachea is midline. Lungs:  Clear to auscultate bilaterally without  rhonchi, wheeze, or rales. Heart:  Regular rate and rhythm.  Without murmurs, rubs, or gallops. Abdomen:  Soft, nontender, nondistended.  Without guarding or rebound. Extremities/skin: Do not apprecaite any splinter hemorrhages or Osler notes/Janeway; bilateral wrists bandages clean dry intact, left ankle 1+ pitting edema distal to the medial malleolus, right hand 1+ pitting edema diffusely distal to postoperative dressing. Neurologic: Grossly normal   Consultants:   ID  Hand surgery/orthopedic surgery  Cardiology  Cardiothoracic surgery  Procedures:   TTE 04/29/2020  TEE 05/06/2020  Data Reviewed: I have personally reviewed following labs and imaging studies Results for orders placed or performed during the hospital encounter of 04/28/20 (from the past 24 hour(s))  I-STAT, chem 8     Status: Abnormal   Collection Time: 05/06/20 11:07 AM  Result Value Ref Range   Sodium 138 135 - 145 mmol/L   Potassium 4.6 3.5 - 5.1 mmol/L   Chloride 102 98 - 111 mmol/L   BUN 15 6 - 20 mg/dL   Creatinine, Ser 4.54 (L) 0.61 - 1.24 mg/dL   Glucose, Bld 90 70 - 99 mg/dL   Calcium, Ion 0.98 (L) 1.15 - 1.40 mmol/L   TCO2 24 22 - 32 mmol/L   Hemoglobin 8.8 (L) 13.0 - 17.0 g/dL   HCT 11.9 (L) 39 - 52 %  Blood gas, arterial     Status: Abnormal   Collection Time: 05/06/20 10:15 PM  Result Value Ref Range   pH, Arterial 7.482 (H) 7.35 - 7.45   pCO2 arterial 29.8 (L) 32 - 48 mmHg   pO2, Arterial 64.3 (L) 83 - 108 mmHg   Bicarbonate 22.0 20.0 - 28.0 mmol/L   Acid-base deficit 0.6 0.0 - 2.0 mmol/L   O2 Saturation 93.1 %   Patient temperature 98.6     Recent Results (from the past 240 hour(s))  Culture, blood (routine x 2)     Status: Abnormal   Collection Time: 04/28/20  3:45 PM   Specimen: BLOOD  Result Value Ref Range Status   Specimen Description   Final    BLOOD LEFT ANTECUBITAL Performed at [facility], [address], [place], [place] [address]    Special Requests    Final    BOTTLES DRAWN AEROBIC AND ANAEROBIC Blood Culture adequate volume Performed at [facility], [address] [place], [place] [address]    Culture  Setup Time   Final    GRAM POSITIVE COCCI IN BOTH AEROBIC AND ANAEROBIC BOTTLES CRITICAL RESULT CALLED TO, READ BACK BY AND VERIFIED WITH: PHARMD M. Derrick Ravel [phone] FCP    Culture (A)  Final    STAPHYLOCOCCUS AUREUS METHICILLIN RESISTANT ORGANISM WAS NOT RECOVERED IN  CULTURE. REPEATED FOR CONFIRMATION Performed at [facility], [address], [place], [place] [address]    Report Status 05/03/2020 FINAL  Final   Organism ID, Bacteria STAPHYLOCOCCUS AUREUS  Final      Susceptibility   Staphylococcus aureus - MIC*    CIPROFLOXACIN <=0.5 SENSITIVE Sensitive     ERYTHROMYCIN >=8 RESISTANT Resistant     GENTAMICIN <=0.5 SENSITIVE Sensitive     OXACILLIN 0.5 SENSITIVE Sensitive     TETRACYCLINE <=1 SENSITIVE Sensitive     VANCOMYCIN <=0.5 SENSITIVE Sensitive     TRIMETH/SULFA <=10 SENSITIVE Sensitive     CLINDAMYCIN <=0.25 SENSITIVE Sensitive     RIFAMPIN <=0.5 SENSITIVE Sensitive     Inducible Clindamycin NEGATIVE Sensitive     * STAPHYLOCOCCUS AUREUS  Blood Culture ID Panel (Reflexed)     Status: Abnormal   Collection Time: 04/28/20  3:45 PM  Result Value Ref Range Status   Enterococcus species NOT DETECTED NOT DETECTED Final   Listeria monocytogenes NOT DETECTED NOT DETECTED Final   Staphylococcus species DETECTED (A) NOT DETECTED Final    Comment: CRITICAL RESULT CALLED TO, READ BACK BY AND VERIFIED WITH: PHARMD Lynder Parents [phone] FCP    Staphylococcus aureus (BCID) DETECTED (A) NOT DETECTED Final    Comment: Methicillin (oxacillin)-resistant Staphylococcus aureus (MRSA). MRSA is predictably resistant to beta-lactam antibiotics (except ceftaroline). Preferred therapy is vancomycin unless clinically contraindicated. Patient requires contact precautions if  hospitalized. CRITICAL RESULT CALLED TO,  READ BACK BY AND VERIFIED WITH: PHARMD M. Derrick Ravel [phone] FCP    Methicillin resistance DETECTED (A) NOT DETECTED Final    Comment: CRITICAL RESULT CALLED TO, READ BACK BY AND VERIFIED WITH: PHARMD M. RENZ [phone] FCP    Streptococcus species NOT DETECTED NOT DETECTED Final   Streptococcus agalactiae NOT DETECTED NOT DETECTED Final   Streptococcus pneumoniae NOT DETECTED NOT DETECTED Final   Streptococcus pyogenes NOT DETECTED NOT DETECTED Final   Acinetobacter baumannii NOT DETECTED NOT DETECTED Final   Enterobacteriaceae species NOT DETECTED NOT DETECTED Final   Enterobacter cloacae complex NOT DETECTED NOT DETECTED Final   Escherichia coli NOT DETECTED NOT DETECTED Final   Klebsiella oxytoca NOT DETECTED NOT DETECTED Final   Klebsiella pneumoniae NOT DETECTED NOT DETECTED Final   Proteus species NOT DETECTED NOT DETECTED Final   Serratia marcescens NOT DETECTED NOT DETECTED Final   Haemophilus influenzae NOT DETECTED NOT DETECTED Final   Neisseria meningitidis NOT DETECTED NOT DETECTED Final   Pseudomonas aeruginosa NOT DETECTED NOT DETECTED Final   Candida albicans NOT DETECTED NOT DETECTED Final   Candida glabrata NOT DETECTED NOT DETECTED Final   Candida krusei NOT DETECTED NOT DETECTED Final   Candida parapsilosis NOT DETECTED NOT DETECTED Final   Candida tropicalis NOT DETECTED NOT DETECTED Final    Comment: Performed at [facility], [address], [place], [place] [address]  Culture, blood (routine x 2)     Status: Abnormal   Collection Time: 04/28/20  4:13 PM   Specimen: BLOOD  Result Value Ref Range Status   Specimen Description   Final    BLOOD RIGHT ANTECUBITAL Performed at [facility], [address], [place], [place] [address]    Special Requests   Final    BOTTLES DRAWN AEROBIC AND ANAEROBIC Blood Culture adequate volume Performed at [facility], [address] [place], [place] [address]    Culture  Setup  Time   Final    GRAM POSITIVE  COCCI IN BOTH AEROBIC AND ANAEROBIC BOTTLES CRITICAL VALUE NOTED.  VALUE IS CONSISTENT WITH PREVIOUSLY REPORTED AND CALLED VALUE.    Culture (A)  Final    STAPHYLOCOCCUS AUREUS METHICILLIN RESISTANT ORGANISM WAS NOT RECOVERED IN CULTURE. REPEATED FOR CONFIRMATION Performed at [facility], [address] [place], [place] [address]    Report Status 05/03/2020 FINAL  Final   Organism ID, Bacteria STAPHYLOCOCCUS AUREUS  Final      Susceptibility   Staphylococcus aureus - MIC*    CIPROFLOXACIN <=0.5 SENSITIVE Sensitive     ERYTHROMYCIN >=8 RESISTANT Resistant     GENTAMICIN <=0.5 SENSITIVE Sensitive     OXACILLIN 0.5 SENSITIVE Sensitive     TETRACYCLINE <=1 SENSITIVE Sensitive     VANCOMYCIN <=0.5 SENSITIVE Sensitive     TRIMETH/SULFA <=10 SENSITIVE Sensitive     CLINDAMYCIN <=0.25 SENSITIVE Sensitive     RIFAMPIN <=0.5 SENSITIVE Sensitive     Inducible Clindamycin NEGATIVE Sensitive     * STAPHYLOCOCCUS AUREUS  Urine culture     Status: Abnormal   Collection Time: 04/28/20  7:00 PM   Specimen: In/Out Cath Urine  Result Value Ref Range Status   Specimen Description   Final    IN/OUT CATH URINE Performed at [facility], [address] [place], [place] [address]    Special Requests   Final    NONE Performed at [facility], [address] [place], [place] [address]    Culture >=100,000 COLONIES/mL STAPHYLOCOCCUS AUREUS (A)  Final   Report Status 05/01/2020 FINAL  Final   Organism ID, Bacteria STAPHYLOCOCCUS AUREUS (A)  Final      Susceptibility   Staphylococcus aureus - MIC*    CIPROFLOXACIN <=0.5 SENSITIVE Sensitive     GENTAMICIN <=0.5 SENSITIVE Sensitive     NITROFURANTOIN <=16 SENSITIVE Sensitive     OXACILLIN 0.5 SENSITIVE Sensitive     TETRACYCLINE <=1 SENSITIVE Sensitive     VANCOMYCIN <=0.5 SENSITIVE Sensitive     TRIMETH/SULFA <=10 SENSITIVE Sensitive     CLINDAMYCIN <=0.25 SENSITIVE  Sensitive     RIFAMPIN <=0.5 SENSITIVE Sensitive     Inducible Clindamycin NEGATIVE Sensitive     * >=100,000 COLONIES/mL STAPHYLOCOCCUS AUREUS  SARS Coronavirus 2 by RT PCR (hospital order, performed in [facility] hospital lab) Nasopharyngeal Nasopharyngeal Swab     Status: None   Collection Time: 04/28/20  7:03 PM   Specimen: Nasopharyngeal Swab  Result Value Ref Range Status   SARS Coronavirus 2 NEGATIVE NEGATIVE Final    Comment: (NOTE) SARS-CoV-2 target nucleic acids are NOT DETECTED.  The SARS-CoV-2 RNA is generally detectable in upper and lower respiratory specimens during the acute phase of infection. The lowest concentration of SARS-CoV-2 viral copies this assay can detect is 250 copies / mL. A negative result does not preclude SARS-CoV-2 infection and should not be used as the sole basis for treatment or other patient management decisions.  A negative result may occur with improper specimen collection / handling, submission of specimen other than nasopharyngeal swab, presence of viral mutation(s) within the areas targeted by this assay, and inadequate number of viral copies (<250 copies / mL). A negative result must be combined with clinical observations, patient history, and epidemiological information.  Fact Sheet for Patients:   BoilerBrush.com.cy  Fact Sheet for Healthcare Providers: https://pope.com/  This test is not yet approved or  cleared by the [place] FDA and has been authorized for detection and/or diagnosis of SARS-CoV-2 by FDA  under an Emergency Use Authorization (EUA).  This EUA will remain in effect (meaning this test can be used) for the duration of the COVID-19 declaration under Section 564(b)(1) of the Act, 21 U.S.C. section 360bbb-3(b)(1), unless the authorization is terminated or revoked sooner.  Performed at [facility], [address], [place], [place] [address]   MRSA  PCR Screening     Status: Abnormal   Collection Time: 04/28/20  7:45 PM   Specimen: Nasal Mucosa; Nasopharyngeal  Result Value Ref Range Status   MRSA by PCR POSITIVE (A) NEGATIVE Final    Comment:        The GeneXpert MRSA Assay (FDA approved for NASAL specimens only), is one component of a comprehensive MRSA colonization surveillance program. It is not intended to diagnose MRSA infection nor to guide or monitor treatment for MRSA infections. RESULT CALLED TO, READ BACK BY AND VERIFIED WITH: S SMITH AT 2314 ON 04/28/2020 BY MOSLEY,J  Performed at [facility], [address], [place], [place] [address]   Culture, blood (Routine X 2) w Reflex to ID Panel     Status: None   Collection Time: 04/30/20  8:15 AM   Specimen: BLOOD  Result Value Ref Range Status   Specimen Description   Final    BLOOD LEFT ANTECUBITAL Performed at [facility], [address], [place], [place] [address]    Special Requests   Final    BOTTLES DRAWN AEROBIC AND ANAEROBIC Blood Culture adequate volume Performed at [facility], [address], [place], [place] [address]    Culture   Final    NO GROWTH 5 DAYS Performed at [facility], [address], [place], [place] [address]    Report Status 05/05/2020 FINAL  Final  Culture, blood (Routine X 2) w Reflex to ID Panel     Status: None   Collection Time: 04/30/20  8:15 AM   Specimen: BLOOD LEFT FOREARM  Result Value Ref Range Status   Specimen Description   Final    BLOOD LEFT FOREARM Performed at [facility], [address] [place] [address] [place], [place] [address]    Special Requests   Final    BOTTLES DRAWN AEROBIC ONLY Blood Culture adequate volume Performed at [facility], [address], [place], [place] [address]    Culture   Final    NO GROWTH 5 DAYS Performed at [facility], [address] [place], [place] [address]    Report Status 05/05/2020 FINAL   Final  Fungus Culture With Stain     Status: None (Preliminary result)   Collection Time: 05/03/20  9:35 AM   Specimen: Soft Tissue, Other  Result Value Ref Range Status   Fungus Stain Final report  Final    Comment: (NOTE) Performed At: [facility] [address] [place], [place] [id number] Jolene Schimke MD [id number]    Fungus (Mycology) Culture PENDING  Incomplete   Fungal Source TISSUE  Final    Comment: RT WRIST Performed at [facility], [address] [place], [place] [address]   Aerobic/Anaerobic Culture (surgical/deep wound)     Status: None (Preliminary result)   Collection Time: 05/03/20  9:35 AM   Specimen: Soft Tissue, Other  Result Value Ref Range Status   Specimen Description   Final    TISSUE RT WRIST Performed at [facility], [address], [place], [place] [address]  Special Requests   Final    PODIATRY ANCEF, VANC Performed at [facility], [address], [place], [place] [address]    Gram Stain   Final    RARE WBC PRESENT,BOTH PMN AND MONONUCLEAR RARE GRAM POSITIVE COCCI IN PAIRS IN CLUSTERS Performed at [facility], [address], [place], [place] [address]    Culture   Final    RARE STAPHYLOCOCCUS AUREUS NO ANAEROBES ISOLATED; CULTURE IN PROGRESS FOR 5 DAYS    Report Status PENDING  Incomplete   Organism ID, Bacteria STAPHYLOCOCCUS AUREUS  Final      Susceptibility   Staphylococcus aureus - MIC*    CIPROFLOXACIN <=0.5 SENSITIVE Sensitive     ERYTHROMYCIN >=8 RESISTANT Resistant     GENTAMICIN <=0.5 SENSITIVE Sensitive     OXACILLIN 0.5 SENSITIVE Sensitive     TETRACYCLINE <=1 SENSITIVE Sensitive     VANCOMYCIN <=0.5 SENSITIVE Sensitive     TRIMETH/SULFA <=10 SENSITIVE Sensitive     CLINDAMYCIN <=0.25 SENSITIVE Sensitive     RIFAMPIN <=0.5 SENSITIVE Sensitive     Inducible Clindamycin NEGATIVE Sensitive     * RARE STAPHYLOCOCCUS AUREUS  Fungus Culture Result      Status: None   Collection Time: 05/03/20  9:35 AM  Result Value Ref Range Status   Result 1 Comment  Final    Comment: (NOTE) KOH/Calcofluor preparation:  no fungus observed. Performed At: [facility] [address] [place], [place] [id number] Jolene Schimke MD [id number]   Fungus Culture With Stain     Status: None (Preliminary result)   Collection Time: 05/03/20  9:38 AM   Specimen: Soft Tissue, Other  Result Value Ref Range Status   Fungus Stain Final report  Final    Comment: (NOTE) Performed At: [facility] [address] [place] [address] [place], [place] [id number] Jolene Schimke MD [id number]    Fungus (Mycology) Culture PENDING  Incomplete   Fungal Source TISSUE  Final    Comment: LT HAND Performed at [facility], [address] [place], [place] [address]   Aerobic/Anaerobic Culture (surgical/deep wound)     Status: None (Preliminary result)   Collection Time: 05/03/20  9:38 AM   Specimen: Soft Tissue, Other  Result Value Ref Range Status   Specimen Description   Final    TISSUE LT HAND Performed at [facility], [address], [place], [place] [address]    Special Requests   Final    PATIENT ON FOLLOWING ANCEF, [facility] Performed at [facility], [address] [place], [place] [address]    Gram Stain   Final    NO WBC SEEN RARE GRAM POSITIVE COCCI IN PAIRS IN CLUSTERS Performed at [facility], [address] [place], [place] [address]    Culture   Final    FEW STAPHYLOCOCCUS AUREUS NO ANAEROBES ISOLATED; CULTURE IN PROGRESS FOR 5 DAYS    Report Status PENDING  Incomplete   Organism ID, Bacteria STAPHYLOCOCCUS AUREUS  Final      Susceptibility   Staphylococcus aureus - MIC*    CIPROFLOXACIN <=0.5 SENSITIVE Sensitive     ERYTHROMYCIN >=8 RESISTANT Resistant     GENTAMICIN <=0.5 SENSITIVE Sensitive     OXACILLIN 0.5 SENSITIVE Sensitive     TETRACYCLINE <=1 SENSITIVE Sensitive      VANCOMYCIN 1 SENSITIVE Sensitive     TRIMETH/SULFA <=10 SENSITIVE Sensitive     CLINDAMYCIN <=0.25 SENSITIVE Sensitive     RIFAMPIN <=0.5  SENSITIVE Sensitive     Inducible Clindamycin NEGATIVE Sensitive     * FEW STAPHYLOCOCCUS AUREUS  Fungus Culture Result     Status: None   Collection Time: 05/03/20  9:38 AM  Result Value Ref Range Status   Result 1 Comment  Final    Comment: (NOTE) KOH/Calcofluor preparation:  no fungus observed. Performed At: [facility] [address] [place], [place] [id number] Jolene Schimke MD WU:[id number]   Culture, blood (Routine X 2) w Reflex to ID Panel     Status: None (Preliminary result)   Collection Time: 05/04/20  5:19 PM   Specimen: BLOOD  Result Value Ref Range Status   Specimen Description   Final    BLOOD RIGHT ANTECUBITAL Performed at [facility], [address] [place], [place] [address]    Special Requests   Final    BOTTLES DRAWN AEROBIC AND ANAEROBIC Blood Culture adequate volume Performed at [facility], [address] [place], [place] [address]    Culture   Final    NO GROWTH 2 DAYS Performed at [facility], [address], [place], [place] [address]    Report Status PENDING  Incomplete  Culture, blood (Routine X 2) w Reflex to ID Panel     Status: None (Preliminary result)   Collection Time: 05/04/20  5:19 PM   Specimen: BLOOD  Result Value Ref Range Status   Specimen Description   Final    BLOOD LEFT ANTECUBITAL Performed at [facility], [address] [place], [place] [address]    Special Requests   Final    BOTTLES DRAWN AEROBIC ONLY Blood Culture adequate volume Performed at [facility], [address], [place], [place] [address]    Culture   Final    NO GROWTH 2 DAYS Performed at [facility], [address] [place], [place] [address]    Report Status PENDING  Incomplete     Radiology Studies: DG CHEST PORT 1  VIEW  Result Date: 05/06/2020 CLINICAL DATA:  Shortness of breath. EXAM: PORTABLE CHEST 1 VIEW COMPARISON:  May 03, 2020 FINDINGS: Numerous small masslike opacities are again seen scattered throughout both lungs. There is no evidence of a pleural effusion or pneumothorax. The cardiac silhouette is mildly enlarged. The visualized skeletal structures are unremarkable. IMPRESSION: Numerous small, stable masslike opacities scattered throughout both lungs. Electronically Signed   By: Aram Candela M.D.   On: 05/06/2020 21:30   ECHO TEE  Result Date: 05/06/2020    TRANSESOPHOGEAL ECHO REPORT   Patient Name:   Clifford Clark Date of Exam: 05/06/2020 Medical Rec #:  [id number]     Height:       67.0 in Accession #:    [id number]    Weight:       121.3 lb Date of Birth:  1991/02/13     BSA:          1.635 m Patient Age:    28 years      BP:           132/99 mmHg Patient Gender: M             HR:           107 bpm. Exam Location:  Inpatient Procedure: Transesophageal Echo, 3D Echo, Color Doppler and Cardiac Doppler Indications:     Endocarditis  History:         Patient has prior history of Echocardiogram examinations,  most                  recent 04/29/2020. IVDU.  Sonographer:     Irving Burton Senior RDCS Referring Phys:  [id number] Azucena Fallen Diagnosing Phys: Zoila Shutter MD PROCEDURE: After discussion of the risks and benefits of a TEE, an informed consent was obtained from the patient. The transesophogeal probe was passed without difficulty through the esophogus of the patient. Local oropharyngeal anesthetic was provided with Cetacaine. Sedation performed by different physician. The patient was monitored while under deep sedation. Anesthestetic sedation was provided intravenously by Anesthesiology: 216mg  of Propofol. The patient developed no complications during the procedure. IMPRESSIONS  1. Left ventricular ejection fraction, by estimation, is 60 to 65%. The left ventricle has normal function. The left ventricle  has no regional wall motion abnormalities.  2. Right ventricular systolic function is normal. The right ventricular size is normal.  3. No left atrial/left atrial appendage thrombus was detected.  4. Small to moderate sized pericardial effusion. The pericardial effusion is circumferential. There is no evidence of cardiac tamponade.  5. The mitral valve is grossly normal. Trivial mitral valve regurgitation.  6. Large 1.5 x 2 cm mobile, filamentous vegetation. The tricuspid valve is abnormal. Tricuspid valve regurgitation is severe.  7. The aortic valve is tricuspid. Aortic valve regurgitation is not visualized.  8. The pulmonic valve was abnormal.  9. Small flickering leaflet mass, suggestive of vegetation of the pulmonic valve. Trivial PI. 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 Recommend CT surgery evaluation - consideration of angiojet debulking given severe TR and large size of vegetation. FINDINGS  Left Ventricle: Left ventricular ejection fraction, by estimation, is 60 to 65%. The left ventricle has normal function. The left ventricle has no regional wall motion abnormalities. The left ventricular internal cavity size was normal in size. There is  no left ventricular hypertrophy. Right Ventricle: The right ventricular size is normal. No increase in right ventricular wall thickness. Right ventricular systolic function is normal. Left Atrium: Left atrial size was normal in size. No left atrial/left atrial appendage thrombus was detected. Right Atrium: Right atrial size was normal in size. Pericardium: Small to moderate sized pericardial effusion. The pericardial effusion is circumferential. There is no evidence of cardiac tamponade. Mitral Valve: The mitral valve is grossly normal. Trivial mitral valve regurgitation. Tricuspid Valve: Large 1.5 x 2 cm mobile, filamentous vegetation. The tricuspid valve is abnormal. Tricuspid valve  regurgitation is severe. Aortic Valve: The aortic valve is tricuspid. Aortic valve regurgitation is not visualized. Pulmonic Valve: The pulmonic valve was abnormal. Pulmonic valve regurgitation is trivial. Aorta: The aortic root and ascending aorta are structurally normal, with no evidence of dilitation. Pulmonary Artery: Small flickering leaflet mass, suggestive of vegetation of the pulmonic valve. Trivial PI. Venous: The pulmonary veins were not well visualized. IAS/Shunts: No atrial level shunt detected by color flow Doppler. Agitated saline contrast was given intravenously to evaluate for intracardiac shunting. MD Electronically signed by Zoila Shutter MD Signature Date/Time: 05/06/2020/2:16:32 PM    Final    DG CHEST PORT 1 VIEW  Final Result    MR Lumbar Spine W Wo Contrast  Final Result    MR THORACIC SPINE W WO CONTRAST  Final Result    DG CHEST PORT 1 VIEW  Final Result    DG CHEST PORT 1 VIEW  Final Result    CT WRIST RIGHT W CONTRAST  Final Result    CT HAND LEFT W  CONTRAST  Final Result    US Abdomen Limited RUQ  Final Result    CT Abdomen Pelvis W Contrast  Final Result    CT Head Wo Contrast  Final Result    CT Cervical Spine Wo Contrast  Final Result    CT Chest W Contrast  Final Result    DG Chest Port 1 View  Final Result    DG Wrist Complete Right  Final Result    DG Hand Complete Right  Final Result    DG Hand Complete Left  Final Result       Scheduled Meds: . Chlorhexidine Gluconate Cloth  6 each Topical Q0600  . docusate sodium  100 mg Oral BID  . enoxaparin (LOVENOX) injection  40 mg Subcutaneous Q24H  . mouth rinse  15 mL Mouth Rinse BID  . multivitamin with minerals  1 tablet Oral Daily  . Ensure Max Protein  11 oz Oral Daily  . sodium chloride flush  3 mL Intravenous Q12H   PRN Meds: acetaminophen **OR** acetaminophen, ipratropium-albuterol, LORazepam, metoprolol tartrate, morphine injection, ondansetron **OR**  ondansetron (ZOFRAN) IV, oxyCODONE Continuous Infusions: .  ceFAZolin (ANCEF) IV Stopped (05/07/20 0615)  . lactated ringers 75 mL/hr at 05/07/20 0600    LOS: 9 days  Time spent: 40min  Azucena FallenWilliam C Emi Lymon, DO [facility] 05/07/2020, 7:11 AM  Contact via secure chat.

## 2020-05-07 NOTE — Progress Notes (Signed)
Upon initial assessment during shift change at 21:30, pt was in clear distress, unable to catch his breath and form sentences to express his needs. He was tachypenic @ 45 bpm, HR in the 140s. visibly diaphoretic with an oral temp of 103.5--pt was given PO Tylenol. On call MD gave orders for a NS bolus, IV toradol, LR bolus, and Lopressor IV. PRN morphine, Ativan, and Oxycodone have been given regularly at each interval. Pts temperature has dropped to 99.3 oral, and he appears to be resting comfortably at this time with a respiratory rate of 25 and HR of 105. RN will continue to monitor patient closely.

## 2020-05-08 DIAGNOSIS — L8990 Pressure ulcer of unspecified site, unspecified stage: Secondary | ICD-10-CM | POA: Insufficient documentation

## 2020-05-08 LAB — AEROBIC/ANAEROBIC CULTURE W GRAM STAIN (SURGICAL/DEEP WOUND): Gram Stain: NONE SEEN

## 2020-05-08 MED ORDER — LORAZEPAM 2 MG/ML IJ SOLN
1.0000 mg | INTRAMUSCULAR | Status: DC | PRN
  Administered 2020-05-08 – 2020-05-13 (×20): 1 mg via INTRAVENOUS
  Filled 2020-05-08 (×20): qty 1

## 2020-05-08 MED ORDER — METOPROLOL TARTRATE 5 MG/5ML IV SOLN
2.5000 mg | Freq: Four times a day (QID) | INTRAVENOUS | Status: DC
  Administered 2020-05-08 – 2020-05-10 (×8): 2.5 mg via INTRAVENOUS
  Filled 2020-05-08 (×8): qty 5

## 2020-05-08 NOTE — Plan of Care (Signed)

## 2020-05-08 NOTE — Progress Notes (Signed)
PROGRESS NOTE    Clifford Clark   ZOX:[id number]  DOB: 08-02-1991  DOA: 04/28/2020     10  PCP: Patient, [name]  CC: found asleep on the ground  Hospital Course: Mr. 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 homeless, asthma who presented to the ER after being found on the ground sleeping by a friend he stated.  EMS was called and he was transported to the hospital. In the ER with multiple imaging studies including x-ray of his right hand and wrist, x-ray of left hand, CXR, CT chest/abdomen/pelvis, CT C-spine, and CT head.  There were no acute fractures in his bilateral upper extremities.  CT head also unremarkable.  Remainder of his imaging studies revealed what appeared to be septic pulmonary emboli as well as septic emboli involving both kidneys. Cultures were drawn and he was started on vancomycin, cefepime, and Flagyl in the ER - 4/4 blood cultures were growing MRSA. TTE also performed on 7/9 revealing TV vegetation. Became verbally abusive/threatening 05/01/20 and Psych was consulted and a sitter was requested.  7/12 Patient developed worsening swelling in his right wrist and left hand. There was concern for septic joint or bursitis. A CT was ordered bilaterally which did show bilateral abscesses.  7/13 Bilateral I&D complaining of pain - admits to taking Carfentanyl at home/off the street.  7/16 TEE shows large tricuspid vegetation -  transfer to [facility] for CT Sx to evaluate for angiovac 7/17 Tachycardia/tachypnea/Febrile overnight resolved with IV fluids and pain medication   Interval History/ROS:  C/o pain all over  Assessment & Plan: Principal Problem:   Endocarditis Active Problems:   IVDU (intravenous drug user)   Sepsis (HCC)   Thrombocytopenia (HCC)   Hypokalemia   Hyponatremia   MRSA bacteremia   Malnutrition of moderate degree   Threatening behavior   Hand abscess   Elevated BUN   Pressure injury of skin   MSSA  bacteremia with concurrent endocarditis in the setting of IV drug abuse - Febrile again overnight -left ankle improved - TTE performed on 04/29/20: TV vegetation (14mm) noted as well as possible veg on PV - TEE shows large mobile vegetation on tricuspid valve and small vegetation on pulmonic valve with no PFO - CT surgery possible angiovac at [facility] on 05/18/20 pending scheduling - MRI T/L spine negative for acute findings - ID following appreciate insight/recommendations - will need minimum 6 weeks IV antibiotics - changed to cefazolin 05/05/20 - Blood cultures finalized over the past 48h now showing MSSA (previously labeled MRSA on BCID in error) Trend BC until clear (7/10 and 7/14 cultures remain NGTD) prior to PICC placement; patient will need to remain in hospital for duration of treatment course; no indication for IVC if tries to leave AMA but PICC will have to be removed  Hand abscess, bilateral, s/p I&D - Likely secondary to above - Bilateral abscesses on hands, s/p CT on 7/11 - Appreciate hand surgery consult; I&D on 7/13 -encouraged him to keep elevated  Sepsis (HCC) secondary to above - See MSSA bacteremia  Acute metabolic encephalopathy, resolved - Considered due to IV drugs as well as sepsis/infection - Mentation has cleared and diet started   Anemia, likely multifactorial Acute (questionably blood loss versus dilutional) on chronic anemia of likely chronic disease -Iron deficiency notable on labs, cannot rule out malnutrition or anemia of chronic disease concurrently.  -Decrease lab draws to every 72 hours in hopes to  avoid bloodletting the patient -Status post 1 unit PRBC (7/16) CBC Latest Ref Rng & Units 05/06/2020 05/06/2020 05/05/2020  WBC 4.0 - 10.5 K/uL - 13.8(H) 16.5(H)  Hemoglobin 13.0 - 17.0 g/dL 3.5(W) 6.8(LL) 7.4(L)  Hematocrit 39 - 52 % 26.0(L) 21.3(L) 23.0(L)  Platelets 150 - 400 K/uL - 214 221   Thrombocytopenia (HCC) - Likely due to acute  illness/sepsis; cannot rule out other etiologies yet - Trend CBC with infection treatment; slowly improving   Hyponatremia - Replete and recheck as needed  Elevated LFTs - Possibly due to current infection/sepsis vs risk for hepatitis from IV drug use - Hepatitis panel noted for hep C Ab positive -  HCV Quant: Low/undetectable - LFTs improving   Hypokalemia, resolved -Replete and recheck as needed Lab Results  Component Value Date   K 4.6 05/06/2020   Malnutrition of moderate degree Per RD: "Moderate Malnutrition related to social / environmental circumstances as evidenced by energy intake < 75% for > or equal to 3 months, moderate fat depletion, moderate muscle depletion, percent weight loss."  Threatening behavior - on 7/11, patient endorsed multiple homicidal ideations to nurse and CNA in room; he threatened that he was going "to kill the attending" and all their family and kids; this was voiced several times despite nursing attempting to re-direct patient - psych eval requested - no evidence of imminent risk to self/others  AKI, Resolved - BUN now downtrending appropriately; creatinine stable  Antimicrobials: Cefazolin 05/05/2020>> ongoing Vanc 04/28/20 >> 7/15 Cefepime 04/28/20 - 04/29/20 Flagyl 04/28/20 - 04/29/20  DVT prophylaxis: Lovenox Code Status: Full Disposition Plan:  Status is: Inpatient  Remains inpatient appropriate because:Ongoing active pain requiring inpatient pain management, Unsafe d/c plan, IV treatments appropriate due to intensity of illness or inability to take PO and Inpatient level of care appropriate due to severity of illness   Dispo: The patient is from: Home              Anticipated d/c is to: Home              Anticipated d/c date is: > 3 days              Patient currently is not medically stable to d/c.  Objective: Blood pressure 132/78, pulse (!) 135, temperature 100.1 F (37.8 C), temperature source Oral, resp. rate 20, height 5\' 7"  (1.702  m), weight 55 kg, SpO2 95 %.   Intake/Output Summary (Last 24 hours) at 05/08/2020 1312 Last data filed at 05/08/2020 1009 Gross per 24 hour  Intake 2195.69 ml  Output 3700 ml  Net -1504.31 ml   Last Weight  Most recent update: 04/28/2020  9:43 PM   Weight  55 kg (121 lb 4.1 oz)           Examination:  General: Appearance:    Ill appearing male in no acute distress     Lungs:     Clear to auscultation bilaterally, respirations unlabored  Heart:    Tachycardic.   MS:   All extremities are intact. Left ankle w/o redness or swelling, right hand in splint with edema in fingers- able to move all fingers  Neurologic:   Awake, alert, oriented x 3. No apparent focal neurological           defect.     Consultants:   ID  Hand surgery/orthopedic surgery  Cardiology  Cardiothoracic surgery  Procedures:   TTE 04/29/2020  TEE 05/06/2020  Data Reviewed:  No results found for this  or any previous visit (from the past 24 hour(s)).  Recent Results (from the past 240 hour(s))  Culture, blood (routine x 2)     Status: Abnormal   Collection Time: 04/28/20  3:45 PM   Specimen: BLOOD  Result Value Ref Range Status   Specimen Description   Final    BLOOD LEFT ANTECUBITAL Performed at [facility], [address], [place], [place] [address]    Special Requests   Final    BOTTLES DRAWN AEROBIC AND ANAEROBIC Blood Culture adequate volume Performed at [facility], [address], [place], [place] [address]    Culture  Setup Time   Final    GRAM POSITIVE COCCI IN BOTH AEROBIC AND ANAEROBIC BOTTLES CRITICAL RESULT CALLED TO, READ BACK BY AND VERIFIED WITH: PHARMD M. Derrick Ravel [phone] FCP    Culture (A)  Final    STAPHYLOCOCCUS AUREUS METHICILLIN RESISTANT ORGANISM WAS NOT RECOVERED IN CULTURE. REPEATED FOR CONFIRMATION Performed at [facility], [address] [place], [place] [address]    Report Status 05/03/2020 FINAL  Final   Organism ID,  Bacteria STAPHYLOCOCCUS AUREUS  Final      Susceptibility   Staphylococcus aureus - MIC*    CIPROFLOXACIN <=0.5 SENSITIVE Sensitive     ERYTHROMYCIN >=8 RESISTANT Resistant     GENTAMICIN <=0.5 SENSITIVE Sensitive     OXACILLIN 0.5 SENSITIVE Sensitive     TETRACYCLINE <=1 SENSITIVE Sensitive     VANCOMYCIN <=0.5 SENSITIVE Sensitive     TRIMETH/SULFA <=10 SENSITIVE Sensitive     CLINDAMYCIN <=0.25 SENSITIVE Sensitive     RIFAMPIN <=0.5 SENSITIVE Sensitive     Inducible Clindamycin NEGATIVE Sensitive     * STAPHYLOCOCCUS AUREUS  Blood Culture ID Panel (Reflexed)     Status: Abnormal   Collection Time: 04/28/20  3:45 PM  Result Value Ref Range Status   Enterococcus species NOT DETECTED NOT DETECTED Final   Listeria monocytogenes NOT DETECTED NOT DETECTED Final   Staphylococcus species DETECTED (A) NOT DETECTED Final    Comment: CRITICAL RESULT CALLED TO, READ BACK BY AND VERIFIED WITH: PHARMD Lynder Parents [phone] FCP    Staphylococcus aureus (BCID) DETECTED (A) NOT DETECTED Final    Comment: Methicillin (oxacillin)-resistant Staphylococcus aureus (MRSA). MRSA is predictably resistant to beta-lactam antibiotics (except ceftaroline). Preferred therapy is vancomycin unless clinically contraindicated. Patient requires contact precautions if  hospitalized. CRITICAL RESULT CALLED TO, READ BACK BY AND VERIFIED WITH: PHARMD M. Derrick Ravel [phone] FCP    Methicillin resistance DETECTED (A) NOT DETECTED Final    Comment: CRITICAL RESULT CALLED TO, READ BACK BY AND VERIFIED WITH: PHARMD M. RENZ [phone] FCP    Streptococcus species NOT DETECTED NOT DETECTED Final   Streptococcus agalactiae NOT DETECTED NOT DETECTED Final   Streptococcus pneumoniae NOT DETECTED NOT DETECTED Final   Streptococcus pyogenes NOT DETECTED NOT DETECTED Final   Acinetobacter baumannii NOT DETECTED NOT DETECTED Final   Enterobacteriaceae species NOT DETECTED NOT DETECTED Final   Enterobacter cloacae complex NOT  DETECTED NOT DETECTED Final   Escherichia coli NOT DETECTED NOT DETECTED Final   Klebsiella oxytoca NOT DETECTED NOT DETECTED Final   Klebsiella pneumoniae NOT DETECTED NOT DETECTED Final   Proteus species NOT DETECTED NOT DETECTED Final   Serratia marcescens NOT DETECTED NOT DETECTED Final   Haemophilus influenzae NOT DETECTED NOT DETECTED Final   Neisseria meningitidis NOT DETECTED NOT DETECTED Final   Pseudomonas aeruginosa NOT DETECTED NOT DETECTED Final   Candida  albicans NOT DETECTED NOT DETECTED Final   Candida glabrata NOT DETECTED NOT DETECTED Final   Candida krusei NOT DETECTED NOT DETECTED Final   Candida parapsilosis NOT DETECTED NOT DETECTED Final   Candida tropicalis NOT DETECTED NOT DETECTED Final    Comment: Performed at [facility], [address] [place], [place] [address]  Culture, blood (routine x 2)     Status: Abnormal   Collection Time: 04/28/20  4:13 PM   Specimen: BLOOD  Result Value Ref Range Status   Specimen Description   Final    BLOOD RIGHT ANTECUBITAL Performed at [facility], [address] [place], [place] [address]    Special Requests   Final    BOTTLES DRAWN AEROBIC AND ANAEROBIC Blood Culture adequate volume Performed at [facility], [address] [place], [place] [address]    Culture  Setup Time   Final    GRAM POSITIVE COCCI IN BOTH AEROBIC AND ANAEROBIC BOTTLES CRITICAL VALUE NOTED.  VALUE IS CONSISTENT WITH PREVIOUSLY REPORTED AND CALLED VALUE.    Culture (A)  Final    STAPHYLOCOCCUS AUREUS METHICILLIN RESISTANT ORGANISM WAS NOT RECOVERED IN CULTURE. REPEATED FOR CONFIRMATION Performed at [facility], [address] [place], [place] [address]    Report Status 05/03/2020 FINAL  Final   Organism ID, Bacteria STAPHYLOCOCCUS AUREUS  Final      Susceptibility   Staphylococcus aureus - MIC*    CIPROFLOXACIN <=0.5 SENSITIVE Sensitive     ERYTHROMYCIN >=8 RESISTANT Resistant      GENTAMICIN <=0.5 SENSITIVE Sensitive     OXACILLIN 0.5 SENSITIVE Sensitive     TETRACYCLINE <=1 SENSITIVE Sensitive     VANCOMYCIN <=0.5 SENSITIVE Sensitive     TRIMETH/SULFA <=10 SENSITIVE Sensitive     CLINDAMYCIN <=0.25 SENSITIVE Sensitive     RIFAMPIN <=0.5 SENSITIVE Sensitive     Inducible Clindamycin NEGATIVE Sensitive     * STAPHYLOCOCCUS AUREUS  Urine culture     Status: Abnormal   Collection Time: 04/28/20  7:00 PM   Specimen: In/Out Cath Urine  Result Value Ref Range Status   Specimen Description   Final    IN/OUT CATH URINE Performed at [facility], [address] [place], [place] [address]    Special Requests   Final    NONE Performed at [facility], [address] [place], [place] [address]    Culture >=100,000 COLONIES/mL STAPHYLOCOCCUS AUREUS (A)  Final   Report Status 05/01/2020 FINAL  Final   Organism ID, Bacteria STAPHYLOCOCCUS AUREUS (A)  Final      Susceptibility   Staphylococcus aureus - MIC*    CIPROFLOXACIN <=0.5 SENSITIVE Sensitive     GENTAMICIN <=0.5 SENSITIVE Sensitive     NITROFURANTOIN <=16 SENSITIVE Sensitive     OXACILLIN 0.5 SENSITIVE Sensitive     TETRACYCLINE <=1 SENSITIVE Sensitive     VANCOMYCIN <=0.5 SENSITIVE Sensitive     TRIMETH/SULFA <=10 SENSITIVE Sensitive     CLINDAMYCIN <=0.25 SENSITIVE Sensitive     RIFAMPIN <=0.5 SENSITIVE Sensitive     Inducible Clindamycin NEGATIVE Sensitive     * >=100,000 COLONIES/mL STAPHYLOCOCCUS AUREUS  SARS Coronavirus 2 by RT PCR (hospital order, performed in [facility] hospital lab) Nasopharyngeal Nasopharyngeal Swab     Status: None   Collection Time: 04/28/20  7:03 PM   Specimen: Nasopharyngeal Swab  Result Value Ref Range Status   SARS Coronavirus 2 NEGATIVE NEGATIVE Final    Comment: (  NOTE) SARS-CoV-2 target nucleic acids are NOT DETECTED.  The SARS-CoV-2 RNA is generally detectable in upper and lower respiratory specimens during the acute phase of  infection. The lowest concentration of SARS-CoV-2 viral copies this assay can detect is 250 copies / mL. A negative result does not preclude SARS-CoV-2 infection and should not be used as the sole basis for treatment or other patient management decisions.  A negative result may occur with improper specimen collection / handling, submission of specimen other than nasopharyngeal swab, presence of viral mutation(s) within the areas targeted by this assay, and inadequate number of viral copies (<250 copies / mL). A negative result must be combined with clinical observations, patient history, and epidemiological information.  Fact Sheet for Patients:   BoilerBrush.com.cy  Fact Sheet for Healthcare Providers: https://pope.com/  This test is not yet approved or  cleared by the [place] FDA and has been authorized for detection and/or diagnosis of SARS-CoV-2 by FDA under an Emergency Use Authorization (EUA).  This EUA will remain in effect (meaning this test can be used) for the duration of the COVID-19 declaration under Section 564(b)(1) of the Act, 21 U.S.C. section 360bbb-3(b)(1), unless the authorization is terminated or revoked sooner.  Performed at [facility], [address], [place], [place] [address]   MRSA PCR Screening     Status: Abnormal   Collection Time: 04/28/20  7:45 PM   Specimen: Nasal Mucosa; Nasopharyngeal  Result Value Ref Range Status   MRSA by PCR POSITIVE (A) NEGATIVE Final    Comment:        The GeneXpert MRSA Assay (FDA approved for NASAL specimens only), is one component of a comprehensive MRSA colonization surveillance program. It is not intended to diagnose MRSA infection nor to guide or monitor treatment for MRSA infections. RESULT CALLED TO, READ BACK BY AND VERIFIED WITH: S SMITH AT 2314 ON 04/28/2020 BY MOSLEY,J  Performed at [facility], [address], [place], [place] [address]   Culture, blood (Routine X 2) w Reflex to ID Panel     Status: None   Collection Time: 04/30/20  8:15 AM   Specimen: BLOOD  Result Value Ref Range Status   Specimen Description   Final    BLOOD LEFT ANTECUBITAL Performed at [facility], [address] [place], [place] [address]    Special Requests   Final    BOTTLES DRAWN AEROBIC AND ANAEROBIC Blood Culture adequate volume Performed at [facility], [address] [place], [place] [address]    Culture   Final    NO GROWTH 5 DAYS Performed at [facility], [address] [place], [place] [address]    Report Status 05/05/2020 FINAL  Final  Culture, blood (Routine X 2) w Reflex to ID Panel     Status: None   Collection Time: 04/30/20  8:15 AM   Specimen: BLOOD LEFT FOREARM  Result Value Ref Range Status   Specimen Description   Final    BLOOD LEFT FOREARM Performed at [facility], [address], [place], [place] [address]    Special Requests   Final    BOTTLES DRAWN AEROBIC ONLY Blood Culture adequate volume Performed at [facility], [address], [place], [place] [address]    Culture   Final    NO GROWTH 5 DAYS Performed at [facility], [address], [place], [place] [address]    Report Status  05/05/2020 FINAL  Final  Fungus Culture With Stain     Status: None (Preliminary result)   Collection Time: 05/03/20  9:35 AM   Specimen: Soft Tissue, Other  Result Value Ref Range Status   Fungus Stain Final report  Final    Comment: (NOTE) Performed At: [facility] [address] [place], [place] [id number] Jolene Schimke MD ZH:[id number]    Fungus (Mycology) Culture PENDING  Incomplete   Fungal Source TISSUE  Final    Comment: RT WRIST Performed at [facility], [address], [place], [place] [address]   Aerobic/Anaerobic Culture (surgical/deep wound)     Status: None   Collection  Time: 05/03/20  9:35 AM   Specimen: Soft Tissue, Other  Result Value Ref Range Status   Specimen Description   Final    TISSUE RT WRIST Performed at [facility], [address], [place], [place] [address]    Special Requests   Final    PODIATRY [place] Performed at [facility], [address], [place], [place] [address]    Gram Stain   Final    RARE WBC PRESENT,BOTH PMN AND MONONUCLEAR RARE GRAM POSITIVE COCCI IN PAIRS IN CLUSTERS    Culture   Final    RARE STAPHYLOCOCCUS AUREUS NO ANAEROBES ISOLATED Performed at [facility], [address], [place], [place] [address]    Report Status 05/08/2020 FINAL  Final   Organism ID, Bacteria STAPHYLOCOCCUS AUREUS  Final      Susceptibility   Staphylococcus aureus - MIC*    CIPROFLOXACIN <=0.5 SENSITIVE Sensitive     ERYTHROMYCIN >=8 RESISTANT Resistant     GENTAMICIN <=0.5 SENSITIVE Sensitive     OXACILLIN 0.5 SENSITIVE Sensitive     TETRACYCLINE <=1 SENSITIVE Sensitive     VANCOMYCIN <=0.5 SENSITIVE Sensitive     TRIMETH/SULFA <=10 SENSITIVE Sensitive     CLINDAMYCIN <=0.25 SENSITIVE Sensitive     RIFAMPIN <=0.5 SENSITIVE Sensitive     Inducible Clindamycin NEGATIVE Sensitive     * RARE STAPHYLOCOCCUS AUREUS  Fungus Culture Result     Status: None   Collection Time: 05/03/20  9:35 AM  Result Value Ref Range Status   Result 1 Comment  Final    Comment: (NOTE) KOH/Calcofluor preparation:  no fungus observed. Performed At: [facility] [address] [place], [place] [id number] Jolene Schimke MD VZ:[id number]   Fungus Culture With Stain     Status: None (Preliminary result)   Collection Time: 05/03/20  9:38 AM   Specimen: Soft Tissue, Other  Result Value Ref Range Status   Fungus Stain Final report  Final    Comment: (NOTE) Performed At: [facility] [address] [place], [place] [id number] Jolene Schimke MD [id number]    Fungus (Mycology) Culture  PENDING  Incomplete   Fungal Source TISSUE  Final    Comment: LT HAND Performed at [facility], [address], [place], [place] [address]   Aerobic/Anaerobic Culture (surgical/deep wound)     Status: None   Collection Time: 05/03/20  9:38 AM   Specimen: Soft Tissue, Other  Result Value Ref Range Status   Specimen Description   Final    TISSUE LT HAND Performed at [facility], [address] [place] [address], [place], [place] [address]    Special Requests   Final    PATIENT ON FOLLOWING ANCEF, [facility] Performed at [facility], [address], [place], [place] [address]  Gram Stain   Final    NO WBC SEEN RARE GRAM POSITIVE COCCI IN PAIRS IN CLUSTERS    Culture   Final    FEW STAPHYLOCOCCUS AUREUS NO ANAEROBES ISOLATED Performed at [facility], [address] [place], [place] [address]    Report Status 05/08/2020 FINAL  Final   Organism ID, Bacteria STAPHYLOCOCCUS AUREUS  Final      Susceptibility   Staphylococcus aureus - MIC*    CIPROFLOXACIN <=0.5 SENSITIVE Sensitive     ERYTHROMYCIN >=8 RESISTANT Resistant     GENTAMICIN <=0.5 SENSITIVE Sensitive     OXACILLIN 0.5 SENSITIVE Sensitive     TETRACYCLINE <=1 SENSITIVE Sensitive     VANCOMYCIN 1 SENSITIVE Sensitive     TRIMETH/SULFA <=10 SENSITIVE Sensitive     CLINDAMYCIN <=0.25 SENSITIVE Sensitive     RIFAMPIN <=0.5 SENSITIVE Sensitive     Inducible Clindamycin NEGATIVE Sensitive     * FEW STAPHYLOCOCCUS AUREUS  Fungus Culture Result     Status: None   Collection Time: 05/03/20  9:38 AM  Result Value Ref Range Status   Result 1 Comment  Final    Comment: (NOTE) KOH/Calcofluor preparation:  no fungus observed. Performed At: [facility] [address] [place], [place] [id number] Jolene Schimke MD XB:[id number]   Culture, blood (Routine X 2) w Reflex to ID Panel     Status: None (Preliminary result)   Collection Time: 05/04/20  5:19 PM   Specimen: BLOOD    Result Value Ref Range Status   Specimen Description   Final    BLOOD RIGHT ANTECUBITAL Performed at [facility], [address] [place], [place] [address]    Special Requests   Final    BOTTLES DRAWN AEROBIC AND ANAEROBIC Blood Culture adequate volume Performed at [facility], [address], [place], [place] [address]    Culture   Final    NO GROWTH 3 DAYS Performed at [facility], [address], [place], [place] [address]    Report Status PENDING  Incomplete  Culture, blood (Routine X 2) w Reflex to ID Panel     Status: None (Preliminary result)   Collection Time: 05/04/20  5:19 PM   Specimen: BLOOD  Result Value Ref Range Status   Specimen Description   Final    BLOOD LEFT ANTECUBITAL Performed at [facility], [address] [place], [place] [address]    Special Requests   Final    BOTTLES DRAWN AEROBIC ONLY Blood Culture adequate volume Performed at [facility], [address] [place], [place] [address]    Culture   Final    NO GROWTH 3 DAYS Performed at [facility], [address], [place], [place] [address]    Report Status PENDING  Incomplete     Radiology Studies: DG CHEST PORT 1 VIEW  Result Date: 05/06/2020 CLINICAL DATA:  Shortness of breath. EXAM: PORTABLE CHEST 1 VIEW COMPARISON:  May 03, 2020 FINDINGS: Numerous small masslike opacities are again seen scattered throughout both lungs. There is no evidence of a pleural effusion or pneumothorax. The cardiac silhouette is mildly enlarged. The visualized skeletal structures are unremarkable. IMPRESSION: Numerous small, stable masslike opacities scattered throughout both lungs. Electronically Signed   By: Aram Candela M.D.   On: 05/06/2020 21:30   ECHO TEE  Result Date: 05/06/2020    TRANSESOPHOGEAL ECHO REPORT   Patient Name:   ROMY MCGUE Date of Exam:  05/06/2020 Medical Rec #:  [id number]     Height:       67.0  in Accession #:    [id number]    Weight:       121.3 lb Date of Birth:  1991/05/26     BSA:          1.635 m Patient Age:    28 years      BP:           132/99 mmHg Patient Gender: M             HR:           107 bpm. Exam Location:  Inpatient Procedure: Transesophageal Echo, 3D Echo, Color Doppler and Cardiac Doppler Indications:     Endocarditis  History:         Patient has prior history of Echocardiogram examinations, most                  recent 04/29/2020. IVDU.  Sonographer:     Irving Burton Senior RDCS Referring Phys:  [id number] Azucena Fallen Diagnosing Phys: Zoila Shutter MD PROCEDURE: After discussion of the risks and benefits of a TEE, an informed consent was obtained from the patient. The transesophogeal probe was passed without difficulty through the esophogus of the patient. Local oropharyngeal anesthetic was provided with Cetacaine. Sedation performed by different physician. The patient was monitored while under deep sedation. Anesthestetic sedation was provided intravenously by Anesthesiology: 216mg  of Propofol. The patient developed no complications during the procedure. IMPRESSIONS  1. Left ventricular ejection fraction, by estimation, is 60 to 65%. The left ventricle has normal function. The left ventricle has no regional wall motion abnormalities.  2. Right ventricular systolic function is normal. The right ventricular size is normal.  3. No left atrial/left atrial appendage thrombus was detected.  4. Small to moderate sized pericardial effusion. The pericardial effusion is circumferential. There is no evidence of cardiac tamponade.  5. The mitral valve is grossly normal. Trivial mitral valve regurgitation.  6. Large 1.5 x 2 cm mobile, filamentous vegetation. The tricuspid valve is abnormal. Tricuspid valve regurgitation is severe.  7. The aortic valve is tricuspid. Aortic valve regurgitation is not visualized.  8. The pulmonic valve was abnormal.  9. Small flickering leaflet mass, suggestive of  vegetation of the pulmonic valve. Trivial PI. 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 Recommend CT surgery evaluation - consideration of angiojet debulking given severe TR and large size of vegetation. FINDINGS  Left Ventricle: Left ventricular ejection fraction, by estimation, is 60 to 65%. The left ventricle has normal function. The left ventricle has no regional wall motion abnormalities. The left ventricular internal cavity size was normal in size. There is  no left ventricular hypertrophy. Right Ventricle: The right ventricular size is normal. No increase in right ventricular wall thickness. Right ventricular systolic function is normal. Left Atrium: Left atrial size was normal in size. No left atrial/left atrial appendage thrombus was detected. Right Atrium: Right atrial size was normal in size. Pericardium: Small to moderate sized pericardial effusion. The pericardial effusion is circumferential. There is no evidence of cardiac tamponade. Mitral Valve: The mitral valve is grossly normal. Trivial mitral valve regurgitation. Tricuspid Valve: Large 1.5 x 2 cm mobile, filamentous vegetation. The tricuspid valve is abnormal. Tricuspid valve regurgitation is severe. Aortic Valve: The aortic valve is tricuspid. Aortic valve regurgitation is not visualized. Pulmonic Valve: The pulmonic valve was abnormal. Pulmonic valve regurgitation is trivial. Aorta: The aortic  root and ascending aorta are structurally normal, with no evidence of dilitation. Pulmonary Artery: Small flickering leaflet mass, suggestive of vegetation of the pulmonic valve. Trivial PI. Venous: The pulmonary veins were not well visualized. IAS/Shunts: No atrial level shunt detected by color flow Doppler. Agitated saline contrast was given intravenously to evaluate for intracardiac shunting. Zoila Shutter MD Electronically signed by Zoila Shutter MD Signature Date/Time:  05/06/2020/2:16:32 PM    Final    DG CHEST PORT 1 VIEW  Final Result    MR Lumbar Spine W Wo Contrast  Final Result    MR THORACIC SPINE W WO CONTRAST  Final Result    DG CHEST PORT 1 VIEW  Final Result    DG CHEST PORT 1 VIEW  Final Result    CT WRIST RIGHT W CONTRAST  Final Result    CT HAND LEFT W CONTRAST  Final Result    US Abdomen Limited RUQ  Final Result    CT Abdomen Pelvis W Contrast  Final Result    CT Head Wo Contrast  Final Result    CT Cervical Spine Wo Contrast  Final Result    CT Chest W Contrast  Final Result    DG Chest Port 1 View  Final Result    DG Wrist Complete Right  Final Result    DG Hand Complete Right  Final Result    DG Hand Complete Left  Final Result       Scheduled Meds: . Chlorhexidine Gluconate Cloth  6 each Topical Q0600  . docusate sodium  100 mg Oral BID  . enoxaparin (LOVENOX) injection  40 mg Subcutaneous Q24H  . mouth rinse  15 mL Mouth Rinse BID  . multivitamin with minerals  1 tablet Oral Daily  . Ensure Max Protein  11 oz Oral Daily  . sodium chloride flush  3 mL Intravenous Q12H   PRN Meds: acetaminophen **OR** acetaminophen, ipratropium-albuterol, LORazepam, metoprolol tartrate, morphine injection, ondansetron **OR** ondansetron (ZOFRAN) IV, oxyCODONE Continuous Infusions: .  ceFAZolin (ANCEF) IV Stopped (05/08/20 0601)    LOS: 10 days  Time spent:  Joseph Art, DO [facility] 05/08/2020, 1:12 PM  Contact via secure chat.

## 2020-05-08 NOTE — Progress Notes (Signed)
Patients father Clifford Clark) called regarding visitation with son. At this time Clifford Clark is not ready to add his father to the visitation list, mom Clifford Clark is the only visitor. Information regarding patient will need to be communicated through patient or [name].

## 2020-05-09 ENCOUNTER — Inpatient Hospital Stay (HOSPITAL_COMMUNITY): Payer: Self-pay

## 2020-05-09 ENCOUNTER — Encounter (HOSPITAL_COMMUNITY): Payer: Self-pay | Admitting: Internal Medicine

## 2020-05-09 DIAGNOSIS — I079 Rheumatic tricuspid valve disease, unspecified: Secondary | ICD-10-CM

## 2020-05-09 DIAGNOSIS — R799 Abnormal finding of blood chemistry, unspecified: Secondary | ICD-10-CM

## 2020-05-09 DIAGNOSIS — G9341 Metabolic encephalopathy: Secondary | ICD-10-CM

## 2020-05-09 DIAGNOSIS — R7881 Bacteremia: Secondary | ICD-10-CM

## 2020-05-09 DIAGNOSIS — B9562 Methicillin resistant Staphylococcus aureus infection as the cause of diseases classified elsewhere: Secondary | ICD-10-CM

## 2020-05-09 DIAGNOSIS — M546 Pain in thoracic spine: Secondary | ICD-10-CM

## 2020-05-09 DIAGNOSIS — D696 Thrombocytopenia, unspecified: Secondary | ICD-10-CM

## 2020-05-09 DIAGNOSIS — R000 Tachycardia, unspecified: Secondary | ICD-10-CM

## 2020-05-09 DIAGNOSIS — R0602 Shortness of breath: Secondary | ICD-10-CM

## 2020-05-09 DIAGNOSIS — F1990 Other psychoactive substance use, unspecified, uncomplicated: Secondary | ICD-10-CM

## 2020-05-09 DIAGNOSIS — R7989 Other specified abnormal findings of blood chemistry: Secondary | ICD-10-CM

## 2020-05-09 LAB — BPAM RBC
Blood Product Expiration Date: 202108082359
ISSUE DATE / TIME: 202107160644
Unit Type and Rh: 5100

## 2020-05-09 LAB — CBC
HCT: 22.8 % — ABNORMAL LOW (ref 39.0–52.0)
Hemoglobin: 7.2 g/dL — ABNORMAL LOW (ref 13.0–17.0)
MCH: 27.9 pg (ref 26.0–34.0)
MCHC: 31.6 g/dL (ref 30.0–36.0)
MCV: 88.4 fL (ref 80.0–100.0)
Platelets: 178 10*3/uL (ref 150–400)
RBC: 2.58 MIL/uL — ABNORMAL LOW (ref 4.22–5.81)
RDW: 15.2 % (ref 11.5–15.5)
WBC: 14.7 10*3/uL — ABNORMAL HIGH (ref 4.0–10.5)
nRBC: 0 % (ref 0.0–0.2)

## 2020-05-09 LAB — COMPREHENSIVE METABOLIC PANEL
ALT: 16 U/L (ref 0–44)
AST: 25 U/L (ref 15–41)
Albumin: 1.4 g/dL — ABNORMAL LOW (ref 3.5–5.0)
Alkaline Phosphatase: 69 U/L (ref 38–126)
Anion gap: 9 (ref 5–15)
BUN: 10 mg/dL (ref 6–20)
CO2: 25 mmol/L (ref 22–32)
Calcium: 7.7 mg/dL — ABNORMAL LOW (ref 8.9–10.3)
Chloride: 102 mmol/L (ref 98–111)
Creatinine, Ser: 0.73 mg/dL (ref 0.61–1.24)
GFR calc Af Amer: >60 mL/min (ref >60)
GFR calc non Af Amer: >60 mL/min (ref >60)
Glucose, Bld: 116 mg/dL — ABNORMAL HIGH (ref 70–99)
Potassium: 4.3 mmol/L (ref 3.5–5.1)
Sodium: 136 mmol/L (ref 135–145)
Total Bilirubin: 0.5 mg/dL (ref 0.3–1.2)
Total Protein: 5.6 g/dL — ABNORMAL LOW (ref 6.5–8.1)

## 2020-05-09 LAB — TYPE AND SCREEN
ABO/RH(D): O POS
Antibody Screen: NEGATIVE
Unit division: 0

## 2020-05-09 LAB — CULTURE, BLOOD (ROUTINE X 2)
Culture: NO GROWTH
Culture: NO GROWTH
Special Requests: ADEQUATE
Special Requests: ADEQUATE

## 2020-05-09 MED ORDER — FUROSEMIDE 10 MG/ML IJ SOLN
20.0000 mg | Freq: Once | INTRAMUSCULAR | Status: AC
  Administered 2020-05-09: 20 mg via INTRAVENOUS
  Filled 2020-05-09: qty 2

## 2020-05-09 MED ORDER — OXYCODONE HCL 5 MG PO TABS
5.0000 mg | ORAL_TABLET | ORAL | Status: DC | PRN
  Administered 2020-05-09 – 2020-05-13 (×10): 10 mg via ORAL
  Filled 2020-05-09 (×11): qty 2

## 2020-05-09 NOTE — Progress Notes (Signed)
   05/09/20 1124  Assess: MEWS Score  Temp 98.4 F (36.9 C)  BP 113/76  Pulse Rate (!) 113  ECG Heart Rate (!) 114  Resp 20  Level of Consciousness Alert  SpO2 94 %  O2 Device Room Air  Assess: MEWS Score  MEWS Temp 0  MEWS Systolic 0  MEWS Pulse 2  MEWS RR 0  MEWS LOC 0  MEWS Score 2  MEWS Score Color Yellow  Treat  Pain Scale 0-10  Pain Score Asleep  Take Vital Signs  Increase Vital Sign Frequency  Yellow: Q 2hr X 2 then Q 4hr X 2, if remains yellow, continue Q 4hrs  Escalate  MEWS: Escalate Yellow: discuss with charge nurse/RN and consider discussing with provider and RRT  Notify: Charge Nurse/RN  Name of Charge Nurse/RN Notified April, RN  Date Charge Nurse/RN Notified 05/09/20  Time Charge Nurse/RN Notified 1137  Document  Patient Outcome Not stable and remains on department  Progress note created (see row info) Yes

## 2020-05-09 NOTE — Progress Notes (Signed)
Pt's care taken from 11 am , hand off report taken from April RN  Lonia Farber, RN

## 2020-05-09 NOTE — Consult Note (Signed)
[address]       [place] [address]             [phone]        Foch Rosenwald [facility] Medical Record #[id number] Date of Birth: [birth year]  Referring: No ref. provider found Primary Care: Patient, [name] Primary Cardiologist:No primary care provider on file.  Chief Complaint:    Chief Complaint  Patient presents with  . 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  He originally was found down by the side of the road was brought to the emergency department on 04/28/2020 for further evaluation.  Orthopedic surgery is also been consulted to assist with management of bilateral upper extremity infections which were debrided in the operating room on 05/03/2020.  His blood cultures from the 10th have been negative, but his transesophageal echocardiogram shows a large mobile tricuspid valve vegetation, and severe tricuspid cuspid valve regurgitation.  CTS was consulted to assist with management.    Past Medical History:  Diagnosis Date  . Asthma   . Opiate abuse, continuous (HCC)    Heroin    Past Surgical History:  Procedure Laterality Date  . BUBBLE STUDY  05/06/2020   Procedure: BUBBLE STUDY;  Surgeon: [name], MD;  Location: [facility] ENDOSCOPY;  Service: Cardiovascular;;  . I & D EXTREMITY Bilateral 05/03/2020   Procedure: BILATERAL IRRIGATION AND DEBRIDEMENT HANDS;  Surgeon: [name], MD;  Location: [facility] ORS;  Service: Orthopedics;  Laterality: Bilateral;  . TEE WITHOUT CARDIOVERSION N/A 05/06/2020   Procedure: TRANSESOPHAGEAL ECHOCARDIOGRAM (TEE);  Surgeon: [name], MD;  Location: [facility] ENDOSCOPY;  Service: Cardiovascular;  Laterality: N/A;    Social History   Tobacco Use  Smoking Status Former Smoker  . Packs/day: 0.50  Smokeless Tobacco Never Used  Tobacco Comment   using  nicotine patches    Social History   Substance and Sexual Activity  Alcohol Use Not Currently   Comment: rarely     No Known Allergies    Current Facility-Administered Medications  Medication Dose Route Frequency Provider Last Rate Last Admin  . acetaminophen (TYLENOL) tablet 650 mg  650 mg Oral Q6H PRN [name], MD   650 mg at 05/09/20 0800   Or  . acetaminophen (TYLENOL) suppository 650 mg  650 mg Rectal Q6H PRN [name], MD      . ceFAZolin (ANCEF) IVPB 2g/100 mL premix  2 g Intravenous Q8H Hilty, Lisette Abu, MD 200 mL/hr at 05/09/20 1318 2 g at 05/09/20 1318  . Chlorhexidine Gluconate Cloth 2 % PADS 6 each  6 each Topical Q0600 [name], MD   6 each at 05/08/20 2212  . docusate sodium (COLACE) capsule 100 mg  100 mg Oral BID [name], MD   100 mg at 05/08/20 2211  . enoxaparin (LOVENOX) injection 40 mg  40 mg Subcutaneous Q24H Hilty, Lisette Abu, MD   40 mg at 05/09/20 1414  . ipratropium-albuterol (DUONEB) 0.5-2.5 (3) MG/3ML nebulizer solution 3 mL  3 mL Nebulization Q4H PRN Hilty, Lisette Abu, MD      . LORazepam (ATIVAN) injection 1 mg  1 mg Intravenous Q4H PRN Marlin Canary U, DO   1 mg at 05/09/20 1311  . MEDLINE mouth rinse  15 mL Mouth Rinse BID  [name], MD   15 mL at 05/08/20 2212  . metoprolol tartrate (LOPRESSOR) injection 2.5 mg  2.5 mg Intravenous Q6H Vann, Jessica U, DO   2.5 mg at 05/09/20 1134  . morphine 4 MG/ML injection 4 mg  4 mg Intravenous Q4H PRN [name], MD   4 mg at 05/09/20 1310  . multivitamin with minerals tablet 1 tablet  1 tablet Oral Daily [name], MD   1 tablet at 05/09/20 0950  . ondansetron (ZOFRAN) tablet 4 mg  4 mg Oral Q6H PRN [name], MD       Or  . ondansetron [facility]) injection 4 mg  4 mg Intravenous Q6H PRN [name], MD   4 mg at 05/04/20 1219  . oxyCODONE (Oxy IR/ROXICODONE) immediate release tablet 5-10 mg  5-10 mg Oral Q4H PRN Marlin Canary U, DO   10 mg at 05/09/20  0950  . protein supplement (ENSURE MAX) liquid  11 oz Oral Daily [name], MD   11 oz at 05/09/20 1308  . sodium chloride flush (NS) 0.9 % injection 3 mL  3 mL Intravenous Q12H Hilty, Lisette Abu, MD   3 mL at 05/08/20 1009    Medications Prior to Admission  Medication Sig Dispense Refill Last Dose  . naloxone (NARCAN) 0.4 MG/ML injection Take as needed for accidental opiate overdose (Patient taking differently: Place 0.4 mg into the nose as needed (overdose). Take as needed for accidental opiate overdose) 1 mL 0     History reviewed. No pertinent family history.   Review of Systems:   Review of Systems  Constitutional: Positive for fever.  Respiratory: Positive for cough and shortness of breath.   Musculoskeletal: Positive for joint pain.      Physical Exam: BP 111/80 (BP Location: Left Arm)   Pulse (!) 105   Temp 98.4 F (36.9 C) (Oral)   Resp 20   Ht 5\' 7"  (1.702 m)   Wt 55 kg   SpO2 92%   BMI 18.99 kg/m  Physical Exam Constitutional:      General: He is not in acute distress.    Appearance: He is ill-appearing. He is not toxic-appearing.  HENT:     Head: Normocephalic and atraumatic.  Cardiovascular:     Rate and Rhythm: Tachycardia present.  Pulmonary:     Comments: Tachypnea Abdominal:     General: Abdomen is flat. There is no distension.  Musculoskeletal:     Cervical back: Normal range of motion.  Neurological:     General: No focal deficit present.     Mental Status: He is oriented to person, place, and time.       Diagnostic Studies & Laboratory data:     Echo: Mobile tricuspid valve vegetation.  Mostly on the atrial side, but may be some subvalvular component.  Severe tricuspid valve regurgitation.  Moderate sized pericardial effusion.  No PFO   I have independently reviewed the above radiologic studies and discussed with the patient   Recent Lab Findings: Lab Results  Component Value Date   WBC 14.7 (H) 05/09/2020   HGB 7.2 (L)  05/09/2020   HCT 22.8 (L) 05/09/2020   PLT 178 05/09/2020   GLUCOSE 116 (H) 05/09/2020   CHOL 160 05/06/2019   TRIG 106 05/06/2019   HDL 51 05/06/2019   LDLCALC 88 05/06/2019   ALT 16 05/09/2020   AST 25 05/09/2020   NA 136 05/09/2020   K 4.3 05/09/2020  CL 102 05/09/2020   CREATININE 0.73 05/09/2020   BUN 10 05/09/2020   CO2 25 05/09/2020   TSH 0.441 04/29/2020   INR 1.4 (H) 04/28/2020      Assessment / Plan:   29 year old male with an MSSA tricuspid valve endocarditis in the setting of ongoing IV drug abuse.  His blood cultures have been negative greater than 5 days.  He is also undergone debridement of bilateral upper extremity wounds.  The risks and benefits of angio VAC debridement of his tricuspid valve have been discussed with him and his mother in detail.  He is tentatively scheduled for 05/10/2020.  I further explained that he eventually will need surgical repair of his tricuspid valve but this will only be performed once he is no longer using IV drugs.     I  spent 30 minutes counseling the patient face to face.   [name] 05/09/2020 2:49 PM

## 2020-05-09 NOTE — Progress Notes (Signed)
Pt has a temp of 101.3, shallow respiration 35-48, respiratory sound rhonchi, HR is 130-140, MEWS red, Dr Benjamine Mola paged, RR called,  ice packs given by RR nurse, tylenol given, oxycodone 10mg   given so far after his morphine and ativan IV, pt is still complaining of pain, will closely monitor  , RN

## 2020-05-09 NOTE — TOC Progression Note (Signed)
Transition of Care [facility]) - Progression Note    Patient Details  Name: Ann Groeneveld MRN: [id number] Date of Birth: 19-Apr-1991  [facility]) CM/SW Contact  Leone Haven, RN Phone Number: 05/09/2020, 12:49 PM  Clinical Narrative:    Patient transferred from [facility].  TOC will continue to follow for dc needs.         Expected Discharge Plan and Services                                                 Social Determinants of Health (SDOH) Interventions    Readmission Risk Interventions No flowsheet data found.

## 2020-05-09 NOTE — Progress Notes (Signed)
Consent is taken from the patient for angio VAC debridement on 7/20, pt is NPO since midnight, Mother is in bed side and updated, Pt's MEWS score is yellow, MD, charge nurse aware, pt is running ST and tachypnic,will continue to monitor the patient.  Lonia Farber, RN

## 2020-05-09 NOTE — Significant Event (Addendum)
Rapid Response Event Note  Overview: Time Called: 1747 Arrival Time: 1755 Event Type: MEWS Fever (101.74F), tachycardic (120-130 bpm), RR (30-45)  TV endocarditis plan to undergo angio VAC debridement of his tricuspid valve.   Initial Focused Assessment: Pt lying in bed. Alert, oriented. Grimacing in pain. Skin is hot to touch. Lung sounds are clear. Heart rate is tachycardic, difficult to distinguish adventitious heart sounds from breath sounds. Bowel sounds are active, abdomen is round. Pt states his last BM was yesterday. Pulses are 2+. No JVD noted.   VS: T 101.74F, BP 120/81, HR 130, RR 36, SpO2 95% on room air  Interventions: -Encouraged administration of PRNs for fever and pain -No intervention from RRRN  Plan of Care (if not transferred): -Continue current treatment plan -Recheck VS per MEWs protocol -PRNs available for fever, anxiety, and pain  Call rapid response for additional needs.   Event Summary: Name of Physician Notified: Dr. Benjamine Mola at 1800 Outcome: Stayed in room and stabalized Event End Time: 1830  Jennye Moccasin

## 2020-05-09 NOTE — Progress Notes (Addendum)
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 homeless, asthma who presented to the ER after being found on the ground sleeping by a friend he stated.  EMS was called and he was transported to the hospital. In the ER with multiple imaging studies including x-ray of his right hand and wrist, x-ray of left hand, CXR, CT chest/abdomen/pelvis, CT C-spine, and CT head.  There were no acute fractures in his bilateral upper extremities.  CT head also unremarkable.  Remainder of his imaging studies revealed what appeared to be septic pulmonary emboli as well as septic emboli involving both kidneys. Cultures were drawn and he was started on vancomycin, cefepime, and Flagyl in the ER - 4/4 blood cultures were growing MRSA. TTE also performed on 7/9 revealing TV vegetation. Became verbally abusive/threatening 05/01/20 and Psych was consulted and a sitter was requested.  7/12 Patient developed worsening swelling in his right wrist and left hand. There was concern for septic joint or bursitis. A CT was ordered bilaterally which did show bilateral abscesses.  7/13 Bilateral I&D complaining of pain - admits to taking Carfentanyl at home/off the street.  7/16 TEE shows large tricuspid vegetation -  transfer to [facility] for CT Sx to evaluate for angiovac 7/17 Tachycardia/tachypnea/Febrile overnight resolved with IV fluids and pain medication   Interval History/ROS:  Still with pain all over  Assessment & Plan: Principal Problem:   Endocarditis Active Problems:   IVDU (intravenous drug user)   Sepsis (HCC)   Thrombocytopenia (HCC)   Hypokalemia   Hyponatremia   MRSA bacteremia   Malnutrition of moderate degree   Threatening behavior   Hand abscess   Elevated BUN   Pressure injury of skin   MSSA  bacteremia with concurrent endocarditis in the setting of IV drug abuse - TTE performed on 04/29/20: TV vegetation (14mm) noted as well as possible veg on PV - TEE shows large mobile vegetation on tricuspid valve and small vegetation on pulmonic valve with no PFO - CT surgery possible angiovac at [name] on 05/18/20 pending scheduling - MRI T/L spine negative for acute findings - ID following appreciate insight/recommendations - will need minimum 6 weeks IV antibiotics - changed to cefazolin 05/05/20 - Blood cultures finalized over the past 48h now showing MSSA (previously labeled MRSA on BCID in error) Trend BC until clear (7/10 and 7/14 cultures remain NGTD) prior to PICC placement; patient will need to remain in hospital for duration of treatment course; no indication for IVC if tries to leave AMA but PICC will have to be removed -pain control difficult-- will try to alternate PO and IV  Acute respiratory failure -x ray on 7/16 shows: Numerous small, stable masslike opacities scattered throughout both Lungs -embolic? -monitor closely -wean O2 as able  Hand abscess, bilateral, s/p I&D - Likely secondary to above - Bilateral abscesses on hands, s/p CT on 7/11 - Appreciate hand surgery consult; I&D on 7/13 -encouraged him to keep elevated  Sepsis (HCC) secondary to above - See MSSA bacteremia  Acute metabolic encephalopathy, resolved - Considered due to IV drugs as well as sepsis/infection - Mentation has cleared and diet started   Anemia, likely multifactorial Acute (questionably blood loss versus dilutional) on chronic anemia of  likely chronic disease -Iron deficiency notable on labs, cannot rule out malnutrition or anemia of chronic disease concurrently.  -Decrease lab draws to every 72 hours in hopes to avoid bloodletting the patient -Status post 1 unit PRBC (7/16)  Thrombocytopenia (HCC) - Likely due to acute illness/sepsis; cannot rule out other etiologies yet - Trend  CBC with infection treatment; slowly improving   Hyponatremia - Replete and recheck as needed  Elevated LFTs - Possibly due to current infection/sepsis vs risk for hepatitis from IV drug use - Hepatitis panel noted for hep C Ab positive -  HCV Quant: Low/undetectable - LFTs improving   Hypokalemia, resolved -Repleted  Malnutrition of moderate degree Per RD: "Moderate Malnutrition related to social / environmental circumstances as evidenced by energy intake < 75% for > or equal to 3 months, moderate fat depletion, moderate muscle depletion, percent weight loss."  Threatening behavior - on 7/11, patient endorsed multiple homicidal ideations to nurse and CNA in room; he threatened that he was going "to kill the attending" and all their family and kids; this was voiced several times despite nursing attempting to re-direct patient - psych eval requested - no evidence of imminent risk to self/others  AKI, Resolved - BUN now downtrending appropriately; creatinine stable  Pressure Injury 05/06/20 Coccyx Medial Stage 2 -  Partial thickness loss of dermis presenting as a shallow open injury with a red, pink wound bed without slough. (Active)  05/06/20 0600  Location: Coccyx  Location Orientation: Medial  Staging: Stage 2 -  Partial thickness loss of dermis presenting as a shallow open injury with a red, pink wound bed without slough.  Wound Description (Comments):   Present on Admission:       Antimicrobials: Cefazolin 05/05/2020>> ongoing Vanc 04/28/20 >> 7/15 Cefepime 04/28/20 - 04/29/20 Flagyl 04/28/20 - 04/29/20  DVT prophylaxis: Lovenox Code Status: Full Disposition Plan:  Status is: Inpatient  Remains inpatient appropriate because:Ongoing active pain requiring inpatient pain management, Unsafe d/c plan, IV treatments appropriate due to intensity of illness or inability to take PO and Inpatient level of care appropriate due to severity of illness   Dispo: The patient is from: Home               Anticipated d/c is to: Home              Anticipated d/c date is: > 3 days              Patient currently is not medically stable to d/c.  Objective: Blood pressure 117/76, pulse (!) 115, temperature 98.4 F (36.9 C), temperature source Oral, resp. rate (!) 21, height 5\' 7"  (1.702 m), weight 55 kg, SpO2 90 %.   Intake/Output Summary (Last 24 hours) at 05/09/2020 1057 Last data filed at 05/09/2020 05/11/2020 Gross per 24 hour  Intake 513.81 ml  Output 5400 ml  Net -4886.19 ml   Last Weight  Most recent update: 04/28/2020  9:43 PM   Weight  55 kg (121 lb 4.1 oz)           Examination:  General: Appearance:    Underweight male who appears uncomfortable     Lungs:     Diminished, shallow respirations  Heart:    Tachycardic.   MS:   B/l UE in splint   Neurologic:   Awake, alert, oriented x 3.     Consultants:   ID  Hand surgery/orthopedic surgery  Cardiology  Cardiothoracic surgery  Procedures:   TTE 04/29/2020  TEE 05/06/2020  Data Reviewed:  Results for orders placed or performed during the hospital encounter of 04/28/20 (from the past 24 hour(s))  CBC     Status: Abnormal   Collection Time: 05/09/20  3:07 AM  Result Value Ref Range   WBC 14.7 (H) 4.0 - 10.5 K/uL   RBC 2.58 (L) 4.22 - 5.81 MIL/uL   Hemoglobin 7.2 (L) 13.0 - 17.0 g/dL   HCT 17.6 (L) 39 - 52 %   MCV 88.4 80.0 - 100.0 fL   MCH 27.9 26.0 - 34.0 pg   MCHC 31.6 30.0 - 36.0 g/dL   RDW 16.0 73.7 - 10.6 %   Platelets 178 150 - 400 K/uL   nRBC 0.0 0.0 - 0.2 %  Comprehensive metabolic panel     Status: Abnormal   Collection Time: 05/09/20  3:07 AM  Result Value Ref Range   Sodium 136 135 - 145 mmol/L   Potassium 4.3 3.5 - 5.1 mmol/L   Chloride 102 98 - 111 mmol/L   CO2 25 22 - 32 mmol/L   Glucose, Bld 116 (H) 70 - 99 mg/dL   BUN 10 6 - 20 mg/dL   Creatinine, Ser 2.69 0.61 - 1.24 mg/dL   Calcium 7.7 (L) 8.9 - 10.3 mg/dL   Total Protein 5.6 (L) 6.5 - 8.1 g/dL   Albumin 1.4 (L) 3.5 - 5.0 g/dL     AST 25 15 - 41 U/L   ALT 16 0 - 44 U/L   Alkaline Phosphatase 69 38 - 126 U/L   Total Bilirubin 0.5 0.3 - 1.2 mg/dL   GFR calc non Af Amer >60 >60 mL/min   GFR calc Af Amer >60 >60 mL/min   Anion gap 9 5 - 15  Type and screen [facility]     Status: None   Collection Time: 05/09/20  3:16 AM  Result Value Ref Range   ABO/RH(D) O POS    Antibody Screen NEG    Sample Expiration      05/12/2020,2359 Performed at [facility], [address] [place], [place] [address]   BLOOD TRANSFUSION REPORT - SCANNED     Status: None   Collection Time: 05/09/20 10:28 AM   Narrative   Ordered by an unspecified provider.    Recent Results (from the past 240 hour(s))  Culture, blood (Routine X 2) w Reflex to ID Panel     Status: None   Collection Time: 04/30/20  8:15 AM   Specimen: BLOOD  Result Value Ref Range Status   Specimen Description   Final    BLOOD LEFT ANTECUBITAL Performed at [facility], [address], [place], [place] [address]    Special Requests   Final    BOTTLES DRAWN AEROBIC AND ANAEROBIC Blood Culture adequate volume Performed at [facility], [address] [place], [place] [address]    Culture   Final    NO GROWTH 5 DAYS Performed at [facility], [address], [place], [place] [address]    Report Status 05/05/2020 FINAL  Final  Culture, blood (Routine X 2) w Reflex to ID Panel     Status: None   Collection Time: 04/30/20  8:15 AM   Specimen: BLOOD LEFT FOREARM  Result Value Ref Range Status   Specimen Description   Final    BLOOD LEFT FOREARM Performed at [facility], [address], [place], [place] [address]    Special Requests  Final    BOTTLES DRAWN AEROBIC ONLY Blood Culture adequate volume Performed at [facility], [address], [place], [place] [address]    Culture   Final    NO GROWTH 5 DAYS Performed at [facility], [address]  [place], [place] [address]    Report Status 05/05/2020 FINAL  Final  Fungus Culture With Stain     Status: None (Preliminary result)   Collection Time: 05/03/20  9:35 AM   Specimen: Soft Tissue, Other  Result Value Ref Range Status   Fungus Stain Final report  Final    Comment: (NOTE) Performed At: [facility] [address] [place], [place] [id number] Jolene Schimke MD IH:[id number]    Fungus (Mycology) Culture PENDING  Incomplete   Fungal Source TISSUE  Final    Comment: RT WRIST Performed at [facility], [address] [place], [place] [address]   Aerobic/Anaerobic Culture (surgical/deep wound)     Status: None   Collection Time: 05/03/20  9:35 AM   Specimen: Soft Tissue, Other  Result Value Ref Range Status   Specimen Description   Final    TISSUE RT WRIST Performed at [facility], [address] [place], [place] [address]    Special Requests   Final    PODIATRY [place] Performed at [facility], [address] [place], [place] [address]    Gram Stain   Final    RARE WBC PRESENT,BOTH PMN AND MONONUCLEAR RARE GRAM POSITIVE COCCI IN PAIRS IN CLUSTERS    Culture   Final    RARE STAPHYLOCOCCUS AUREUS NO ANAEROBES ISOLATED Performed at [facility], [address], [place], [place] [address]    Report Status 05/08/2020 FINAL  Final   Organism ID, Bacteria STAPHYLOCOCCUS AUREUS  Final      Susceptibility   Staphylococcus aureus - MIC*    CIPROFLOXACIN <=0.5 SENSITIVE Sensitive     ERYTHROMYCIN >=8 RESISTANT Resistant     GENTAMICIN <=0.5 SENSITIVE Sensitive     OXACILLIN 0.5 SENSITIVE Sensitive     TETRACYCLINE <=1 SENSITIVE Sensitive     VANCOMYCIN <=0.5 SENSITIVE Sensitive     TRIMETH/SULFA <=10 SENSITIVE Sensitive     CLINDAMYCIN <=0.25 SENSITIVE Sensitive     RIFAMPIN <=0.5 SENSITIVE Sensitive     Inducible Clindamycin NEGATIVE Sensitive     * RARE STAPHYLOCOCCUS AUREUS  Fungus Culture  Result     Status: None   Collection Time: 05/03/20  9:35 AM  Result Value Ref Range Status   Result 1 Comment  Final    Comment: (NOTE) KOH/Calcofluor preparation:  no fungus observed. Performed At: [facility] [address] [place], [place] [id number] Jolene Schimke MD [id number]   Fungus Culture With Stain     Status: None (Preliminary result)   Collection Time: 05/03/20  9:38 AM   Specimen: Soft Tissue, Other  Result Value Ref Range Status   Fungus Stain Final report  Final    Comment: (NOTE) Performed At: [facility] [address] [place], [place] [id number] Jolene Schimke MD GP:[id number]    Fungus (Mycology) Culture PENDING  Incomplete   Fungal Source TISSUE  Final    Comment: LT HAND Performed at [facility], [address], [place], [place] [address]   Aerobic/Anaerobic Culture (surgical/deep wound)     Status: None   Collection Time: 05/03/20  9:38 AM   Specimen: Soft Tissue, Other  Result Value Ref Range  Status   Specimen Description   Final    TISSUE LT HAND Performed at [facility], [address] [place], [place] [address]    Special Requests   Final    PATIENT ON FOLLOWING ANCEF, [facility]VANC Performed at [facility], [address] [place], [place] [address]    Gram Stain   Final    NO WBC SEEN RARE GRAM POSITIVE COCCI IN PAIRS IN CLUSTERS    Culture   Final    FEW STAPHYLOCOCCUS AUREUS NO ANAEROBES ISOLATED Performed at [facility], [address] [place], [place] [address]    Report Status 05/08/2020 FINAL  Final   Organism ID, Bacteria STAPHYLOCOCCUS AUREUS  Final      Susceptibility   Staphylococcus aureus - MIC*    CIPROFLOXACIN <=0.5 SENSITIVE Sensitive     ERYTHROMYCIN >=8 RESISTANT Resistant     GENTAMICIN <=0.5 SENSITIVE Sensitive     OXACILLIN 0.5 SENSITIVE Sensitive     TETRACYCLINE <=1 SENSITIVE Sensitive     VANCOMYCIN 1 SENSITIVE Sensitive      TRIMETH/SULFA <=10 SENSITIVE Sensitive     CLINDAMYCIN <=0.25 SENSITIVE Sensitive     RIFAMPIN <=0.5 SENSITIVE Sensitive     Inducible Clindamycin NEGATIVE Sensitive     * FEW STAPHYLOCOCCUS AUREUS  Fungus Culture Result     Status: None   Collection Time: 05/03/20  9:38 AM  Result Value Ref Range Status   Result 1 Comment  Final    Comment: (NOTE) KOH/Calcofluor preparation:  no fungus observed. Performed At: [facility] [address] [place], [place] [id number] Jolene SchimkeNagendra Sanjai MD OZ:[id number]:[phone]   Culture, blood (Routine X 2) w Reflex to ID Panel     Status: None (Preliminary result)   Collection Time: 05/04/20  5:19 PM   Specimen: BLOOD  Result Value Ref Range Status   Specimen Description   Final    BLOOD RIGHT ANTECUBITAL Performed at [facility], [address] [place], [place] [address]    Special Requests   Final    BOTTLES DRAWN AEROBIC AND ANAEROBIC Blood Culture adequate volume Performed at [facility], [address] [place], [place] [address]    Culture   Final    NO GROWTH 4 DAYS Performed at [facility], [address] [place], [place] [address]    Report Status PENDING  Incomplete  Culture, blood (Routine X 2) w Reflex to ID Panel     Status: None (Preliminary result)   Collection Time: 05/04/20  5:19 PM   Specimen: BLOOD  Result Value Ref Range Status   Specimen Description   Final    BLOOD LEFT ANTECUBITAL Performed at [facility], [address] [place], [place] [address]    Special Requests   Final    BOTTLES DRAWN AEROBIC ONLY Blood Culture adequate volume Performed at [facility], [address] [place], [place] [address]    Culture   Final    NO GROWTH 4 DAYS Performed at [facility], [address] [place], [place] [address]    Report Status PENDING  Incomplete     Radiology Studies: No results found. DG CHEST PORT 1 VIEW  Final  Result    MR Lumbar Spine W Wo Contrast  Final Result    MR THORACIC SPINE W WO CONTRAST  Final Result    DG CHEST PORT 1 VIEW  Final Result  DG CHEST PORT 1 VIEW  Final Result    CT WRIST RIGHT W CONTRAST  Final Result    CT HAND LEFT W CONTRAST  Final Result    US Abdomen Limited RUQ  Final Result    CT Abdomen Pelvis W Contrast  Final Result    CT Head Wo Contrast  Final Result    CT Cervical Spine Wo Contrast  Final Result    CT Chest W Contrast  Final Result    DG Chest Port 1 View  Final Result    DG Wrist Complete Right  Final Result    DG Hand Complete Right  Final Result    DG Hand Complete Left  Final Result       Scheduled Meds: . Chlorhexidine Gluconate Cloth  6 each Topical Q0600  . docusate sodium  100 mg Oral BID  . enoxaparin (LOVENOX) injection  40 mg Subcutaneous Q24H  . mouth rinse  15 mL Mouth Rinse BID  . metoprolol tartrate  2.5 mg Intravenous Q6H  . multivitamin with minerals  1 tablet Oral Daily  . Ensure Max Protein  11 oz Oral Daily  . sodium chloride flush  3 mL Intravenous Q12H   PRN Meds: acetaminophen **OR** acetaminophen, ipratropium-albuterol, LORazepam, morphine injection, ondansetron **OR** ondansetron (ZOFRAN) IV, oxyCODONE Continuous Infusions: .  ceFAZolin (ANCEF) IV 2 g (05/09/20 0620)    LOS: 11 days  Time spent:  Joseph Art, DO [facility] 05/09/2020, 10:57 AM  Contact via secure chat.

## 2020-05-09 NOTE — Progress Notes (Signed)
[facility]  Date of Admission:  04/28/2020      Total days of antibiotics 11  Day 5 Cefazolin     ASSESSMENT: Clifford HancockJake Clark is a 29 y.o. 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 Due for Angiovac tomorrow to de-bulk TV with Dr. Cliffton AstersLightfoot. Please send valve findings for routine and fungal cultures.   Ongoing thoracic back pain and fevers - he does have tenderness overlying thoracolumbar spine. Normal neurologic exam. MRI T-L spine was recently negative. Other consideration could be evolving empyema from one of his many peripheral septic pulmonary infarcts. Will repeat CXR and compare to previous CT and CXR for changes. Cleared bacteremia 7/10 with again negative blood cultures on 7/14.   He has findings c/w left sided endocarditis as well - no mention of PFO on TEE and MV without any vegetations. Follow kidney function and urine output - he did have some kidney septic infarcts show up on CT.     PLAN: 1. Continue Vancomycin  2. Follow SCr and urine output  3. Follow Angiovac findings / micro  4. CXR now to r/o empyema   Principal Problem:   Endocarditis of tricuspid valve Active Problems:   Septic embolism (HCC)   MRSA bacteremia   Hand abscess   IVDU (intravenous drug user)   Sepsis (HCC)   Thrombocytopenia (HCC)   Hypokalemia   Hyponatremia   Malnutrition of moderate degree   Threatening behavior   Elevated BUN   Pressure injury of skin   . Chlorhexidine Gluconate Cloth  6 each Topical Q0600  . docusate sodium  100 mg Oral BID  . enoxaparin (LOVENOX) injection  40 mg Subcutaneous Q24H  . mouth rinse  15 mL Mouth Rinse BID  . metoprolol tartrate  2.5 mg Intravenous Q6H  . multivitamin with minerals  1 tablet Oral Daily  . Ensure Max Protein  11 oz Oral Daily  . sodium chloride flush  3 mL Intravenous Q12H     SUBJECTIVE: "My back is killing me from behind my shoulder blades down" Concerned about morphine not being enough for his pain. Does feel panicked and short of breath at times. Cannot lay comfortably on back  No changes in ability to walk or weakness of any extremeties.  Still with fevers overnight to nearly 103.  Due for angiovac tomorrow 7/20.  TEE 7/16 - normal RVF, LVEF 60-65%. Small/moderate circumferential effusion. Trivial MV regurgitation and grossly normal. Large mobile filamentous vegetation on TV 1.5 x 2 cm with severe TR. Small flickering leaflet mass ov pulmonic valve.     Review of Systems: Review of Systems  Constitutional: Positive for fever.  Respiratory: Positive for cough and shortness of breath.   Cardiovascular: Positive for chest pain.  Gastrointestinal: Negative for abdominal pain, nausea and vomiting.  Musculoskeletal: Positive for back pain. Negative for joint pain and neck pain.  Neurological: Negative for focal weakness and weakness.    No Known Allergies  OBJECTIVE: Vitals:   05/09/20 0400 05/09/20 0741 05/09/20 1028 05/09/20 1124  BP: (!) 136/101 132/84 117/76 113/76  Pulse: (!) 121 (!) 132 (!) 115 (!) 113  Resp: 20 (!) 23 (!) 21 20  Temp: 98.3 F (36.8 C) 100.1 F (37.8 C) 98.4 F (36.9 C) 98.4 F (36.9 C)  TempSrc: Oral Oral Oral Oral  SpO2: 100% 96% 90% 94%  Weight:  Height:       Body mass index is 18.99 kg/m.  Physical Exam Vitals reviewed.  Constitutional:      Comments: Appears uncomfortable, resting in bed on stomach.   Cardiovascular:     Rate and Rhythm: Regular rhythm. Tachycardia present.     Heart sounds: Murmur (systolic 2/6) heard.   Pulmonary:     Breath sounds: Rhonchi present. No wheezing.     Comments: tachypneic  Musculoskeletal:     Thoracic back: Tenderness and bony tenderness present. No swelling, edema or deformity.     Lumbar back: Tenderness and bony tenderness present.     Right lower leg: No  edema.     Left lower leg: No edema.  Skin:    General: Skin is warm and dry.     Comments: Multiple abrasions on hands and feet.   Neurological:     Mental Status: He is alert and oriented to person, place, and time.     Sensory: No sensory deficit.     Motor: No weakness.     Lab Results Lab Results  Component Value Date   WBC 14.7 (H) 05/09/2020   HGB 7.2 (L) 05/09/2020   HCT 22.8 (L) 05/09/2020   MCV 88.4 05/09/2020   PLT 178 05/09/2020    Lab Results  Component Value Date   CREATININE 0.73 05/09/2020   BUN 10 05/09/2020   NA 136 05/09/2020   K 4.3 05/09/2020   CL 102 05/09/2020   CO2 25 05/09/2020    Lab Results  Component Value Date   ALT 16 05/09/2020   AST 25 05/09/2020   ALKPHOS 69 05/09/2020   BILITOT 0.5 05/09/2020     Microbiology: Recent Results (from the past 240 hour(s))  Culture, blood (Routine X 2) w Reflex to ID Panel     Status: None   Collection Time: 04/30/20  8:15 AM   Specimen: BLOOD  Result Value Ref Range Status   Specimen Description   Final    BLOOD LEFT ANTECUBITAL Performed at [facility], [address] [place], [place] [address]    Special Requests   Final    BOTTLES DRAWN AEROBIC AND ANAEROBIC Blood Culture adequate volume Performed at [facility], [address] [place], [place] [address]    Culture   Final    NO GROWTH 5 DAYS Performed at [facility], [address], [place], [place] [address]    Report Status 05/05/2020 FINAL  Final  Culture, blood (Routine X 2) w Reflex to ID Panel     Status: None   Collection Time: 04/30/20  8:15 AM   Specimen: BLOOD LEFT FOREARM  Result Value Ref Range Status   Specimen Description   Final    BLOOD LEFT FOREARM Performed at [facility], [address] [place], [place] [address]    Special Requests   Final    BOTTLES DRAWN AEROBIC ONLY Blood Culture adequate volume Performed at [facility], [address] [place], [place] [address]    Culture   Final    NO GROWTH 5 DAYS Performed at [facility], [address] [place], [place] [address]    Report Status 05/05/2020 FINAL  Final  Fungus Culture With Stain     Status: None (Preliminary result)   Collection Time: 05/03/20  9:35 AM   Specimen: Soft Tissue, Other  Result Value Ref Range Status   Fungus Stain Final  report  Final    Comment: (NOTE) Performed At: [facility] [address] [place], [place] [id number] Jolene Schimke MD AT:[id number]    Fungus (Mycology) Culture PENDING  Incomplete   Fungal Source TISSUE  Final    Comment: RT WRIST Performed at [facility], [address] [place] [address], [place], [place] [address]   Aerobic/Anaerobic Culture (surgical/deep wound)     Status: None   Collection Time: 05/03/20  9:35 AM   Specimen: Soft Tissue, Other  Result Value Ref Range Status   Specimen Description   Final    TISSUE RT WRIST Performed at [facility], [address] [place], [place] [address]    Special Requests   Final    PODIATRY [place] Performed at [facility], [address], [place], [place] [address]    Gram Stain   Final    RARE WBC PRESENT,BOTH PMN AND MONONUCLEAR RARE GRAM POSITIVE COCCI IN PAIRS IN CLUSTERS    Culture   Final    RARE STAPHYLOCOCCUS AUREUS NO ANAEROBES ISOLATED Performed at [facility], [address], [place], [place] [address]    Report Status 05/08/2020 FINAL  Final   Organism ID, Bacteria STAPHYLOCOCCUS AUREUS  Final      Susceptibility   Staphylococcus aureus - MIC*    CIPROFLOXACIN <=0.5 SENSITIVE Sensitive     ERYTHROMYCIN >=8 RESISTANT Resistant     GENTAMICIN <=0.5 SENSITIVE Sensitive     OXACILLIN 0.5 SENSITIVE Sensitive     TETRACYCLINE <=1 SENSITIVE Sensitive     VANCOMYCIN <=0.5 SENSITIVE Sensitive     TRIMETH/SULFA <=10 SENSITIVE Sensitive     CLINDAMYCIN <=0.25 SENSITIVE Sensitive      RIFAMPIN <=0.5 SENSITIVE Sensitive     Inducible Clindamycin NEGATIVE Sensitive     * RARE STAPHYLOCOCCUS AUREUS  Fungus Culture Result     Status: None   Collection Time: 05/03/20  9:35 AM  Result Value Ref Range Status   Result 1 Comment  Final    Comment: (NOTE) KOH/Calcofluor preparation:  no fungus observed. Performed At: [facility] [address] [place], [place] [id number] Jolene Schimke MD WN:[id number]   Fungus Culture With Stain     Status: None (Preliminary result)   Collection Time: 05/03/20  9:38 AM   Specimen: Soft Tissue, Other  Result Value Ref Range Status   Fungus Stain Final report  Final    Comment: (NOTE) Performed At: [facility] [address] [place], [place] [id number] Jolene Schimke MD JI:[id number]    Fungus (Mycology) Culture PENDING  Incomplete   Fungal Source TISSUE  Final    Comment: LT HAND Performed at [facility], [address], [place], [place] [address]   Aerobic/Anaerobic Culture (surgical/deep wound)     Status: None   Collection Time: 05/03/20  9:38 AM   Specimen: Soft Tissue, Other  Result Value Ref Range Status   Specimen Description   Final    TISSUE LT HAND Performed at [facility], [address], [place], [place] [address]    Special Requests   Final    PATIENT ON FOLLOWING ANCEF, [facility] Performed at [facility], [address], [place], [place] [address]    Gram Stain   Final    NO WBC SEEN RARE GRAM POSITIVE COCCI IN PAIRS IN CLUSTERS    Culture   Final    FEW STAPHYLOCOCCUS AUREUS NO ANAEROBES ISOLATED Performed at [facility],  [address] [place], [place] [address]    Report Status 05/08/2020 FINAL  Final   Organism ID, Bacteria STAPHYLOCOCCUS AUREUS  Final      Susceptibility   Staphylococcus aureus - MIC*    CIPROFLOXACIN <=0.5 SENSITIVE Sensitive     ERYTHROMYCIN >=8 RESISTANT Resistant     GENTAMICIN <=0.5 SENSITIVE  Sensitive     OXACILLIN 0.5 SENSITIVE Sensitive     TETRACYCLINE <=1 SENSITIVE Sensitive     VANCOMYCIN 1 SENSITIVE Sensitive     TRIMETH/SULFA <=10 SENSITIVE Sensitive     CLINDAMYCIN <=0.25 SENSITIVE Sensitive     RIFAMPIN <=0.5 SENSITIVE Sensitive     Inducible Clindamycin NEGATIVE Sensitive     * FEW STAPHYLOCOCCUS AUREUS  Fungus Culture Result     Status: None   Collection Time: 05/03/20  9:38 AM  Result Value Ref Range Status   Result 1 Comment  Final    Comment: (NOTE) KOH/Calcofluor preparation:  no fungus observed. Performed At: [facility] [address] [place], [place] [id number] Jolene Schimke MD QI:[id number]   Culture, blood (Routine X 2) w Reflex to ID Panel     Status: None (Preliminary result)   Collection Time: 05/04/20  5:19 PM   Specimen: BLOOD  Result Value Ref Range Status   Specimen Description   Final    BLOOD RIGHT ANTECUBITAL Performed at [facility], [address], [place], [place] [address]    Special Requests   Final    BOTTLES DRAWN AEROBIC AND ANAEROBIC Blood Culture adequate volume Performed at [facility], [address], [place], [place] [address]    Culture   Final    NO GROWTH 4 DAYS Performed at [facility], [address], [place], [place] [address]    Report Status PENDING  Incomplete  Culture, blood (Routine X 2) w Reflex to ID Panel     Status: None (Preliminary result)   Collection Time: 05/04/20  5:19 PM   Specimen: BLOOD  Result Value Ref Range Status   Specimen Description   Final    BLOOD LEFT ANTECUBITAL Performed at [facility], [address], [place], [place] [address]    Special Requests   Final    BOTTLES DRAWN AEROBIC ONLY Blood Culture adequate volume Performed at [facility], [address], [place], [place] [address]    Culture   Final    NO GROWTH 4 DAYS Performed at [facility], [address]  [place], [place] [address]    Report Status PENDING  Incomplete     Rexene Alberts, MSN, NP-C [facility]  [email or web address] Pager: [phone] Office: [phone] [facility] [address] Line: [phone]

## 2020-05-09 NOTE — Progress Notes (Signed)
   05/09/20 1746  Assess: MEWS Score  Temp (!) 101.3 F (38.5 C)  BP (!) 126/95  Pulse Rate (!) 104  ECG Heart Rate (!) 128  Resp (!) 32  Level of Consciousness Alert  O2 Device Room Air  O2 Flow Rate (L/min) 2 L/min  Assess: MEWS Score  MEWS Temp 1  MEWS Systolic 0  MEWS Pulse 2  MEWS RR 2  MEWS LOC 0  MEWS Score 5  MEWS Score Color Red  Assess: if the MEWS score is Yellow or Red  Were vital signs taken at a resting state? Yes  Focused Assessment No change from prior assessment  Treat  Pain Scale 0-10  Pain Score 8  Pain Type Acute pain  Pain Location Generalized  Pain Orientation Right;Left  Pain Descriptors / Indicators Aching  Pain Frequency Constant  Pain Onset On-going  Patients Stated Pain Goal 0  Pain Intervention(s) Medication (See eMAR)  Take Vital Signs  Increase Vital Sign Frequency  Red: Q 1hr X 4 then Q 4hr X 4, if remains red, continue Q 4hrs  Escalate  MEWS: Escalate Red: discuss with charge nurse/RN and provider, consider discussing with RRT  Notify: Charge Nurse/RN  Name of Charge Nurse/RN Notified April RN  Date Charge Nurse/RN Notified 05/09/20  Time Charge Nurse/RN Notified 1751  Notify: Provider  Provider Name/Title Dr Hulan Saas  Date Provider Notified 05/09/20  Time Provider Notified 1750  Notification Type Page  Notification Reason Other (Comment) (tachy and fever)  Response See new orders  Notify: Rapid Response  Name of Rapid Response RN Notified [place]  Date Rapid Response Notified 05/09/20  Time Rapid Response Notified 1751  Document  Patient Outcome Not stable and remains on department

## 2020-05-09 NOTE — Plan of Care (Signed)

## 2020-05-10 ENCOUNTER — Encounter (HOSPITAL_COMMUNITY)
Admission: EM | Disposition: A | Discharge status: Home / Self Care | Payer: Self-pay | Source: Home / Self Care | Attending: Internal Medicine

## 2020-05-10 ENCOUNTER — Inpatient Hospital Stay (HOSPITAL_COMMUNITY): Payer: Self-pay | Admitting: Certified Registered"

## 2020-05-10 ENCOUNTER — Inpatient Hospital Stay (HOSPITAL_COMMUNITY): Payer: Self-pay

## 2020-05-10 HISTORY — PX: APPLICATION OF ANGIOVAC: SHX6777

## 2020-05-10 HISTORY — PX: TEE WITHOUT CARDIOVERSION: SHX5443

## 2020-05-10 LAB — POCT I-STAT 7, (LYTES, BLD GAS, ICA,H+H)
Acid-Base Excess: 6 mmol/L — ABNORMAL HIGH (ref 0.0–2.0)
Acid-Base Excess: 6 mmol/L — ABNORMAL HIGH (ref 0.0–2.0)
Bicarbonate: 31.2 mmol/L — ABNORMAL HIGH (ref 20.0–28.0)
Bicarbonate: 30.8 mmol/L — ABNORMAL HIGH (ref 20.0–28.0)
Calcium, Ion: 1.12 mmol/L — ABNORMAL LOW (ref 1.15–1.40)
Calcium, Ion: 1 mmol/L — ABNORMAL LOW (ref 1.15–1.40)
HCT: 19 % — ABNORMAL LOW (ref 39.0–52.0)
HCT: 24 % — ABNORMAL LOW (ref 39.0–52.0)
Hemoglobin: 6.5 g/dL — CL (ref 13.0–17.0)
Hemoglobin: 8.2 g/dL — ABNORMAL LOW (ref 13.0–17.0)
O2 Saturation: 100 %
O2 Saturation: 100 %
Patient temperature: 38.9
Patient temperature: 37.1
Potassium: 4.2 mmol/L (ref 3.5–5.1)
Potassium: 4.3 mmol/L (ref 3.5–5.1)
Sodium: 138 mmol/L (ref 135–145)
Sodium: 138 mmol/L (ref 135–145)
TCO2: 33 mmol/L — ABNORMAL HIGH (ref 22–32)
TCO2: 32 mmol/L (ref 22–32)
pCO2 arterial: 51.2 mmHg — ABNORMAL HIGH (ref 32.0–48.0)
pCO2 arterial: 48.9 mmHg — ABNORMAL HIGH (ref 32.0–48.0)
pH, Arterial: 7.4 (ref 7.350–7.450)
pH, Arterial: 7.408 (ref 7.350–7.450)
pO2, Arterial: 195 mmHg — ABNORMAL HIGH (ref 83.0–108.0)
pO2, Arterial: 301 mmHg — ABNORMAL HIGH (ref 83.0–108.0)

## 2020-05-10 LAB — PREPARE RBC (CROSSMATCH)

## 2020-05-10 LAB — ECHO INTRAOPERATIVE TEE
Height: 67 in
Weight: 1940.05 oz

## 2020-05-10 SURGERY — APPLICATION OF ANGIOVAC
Anesthesia: General

## 2020-05-10 MED ORDER — FENTANYL CITRATE (PF) 250 MCG/5ML IJ SOLN
INTRAMUSCULAR | Status: AC
  Filled 2020-05-10: qty 5

## 2020-05-10 MED ORDER — LACTATED RINGERS IV SOLN: INTRAVENOUS | Status: DC

## 2020-05-10 MED ORDER — ENOXAPARIN SODIUM 40 MG/0.4ML ~~LOC~~ SOLN
40.0000 mg | SUBCUTANEOUS | Status: AC
  Administered 2020-05-11 – 2020-06-08 (×25): 40 mg via SUBCUTANEOUS
  Filled 2020-05-10 (×34): qty 0.4

## 2020-05-10 MED ORDER — HEPARIN SODIUM (PORCINE) 1000 UNIT/ML IJ SOLN
INTRAMUSCULAR | Status: DC | PRN
  Administered 2020-05-10: 9000 [IU] via INTRAVENOUS

## 2020-05-10 MED ORDER — LACTATED RINGERS IV SOLN
INTRAVENOUS | Status: DC | PRN
Start: 2020-05-10 — End: 2020-05-10

## 2020-05-10 MED ORDER — MORPHINE SULFATE (PF) 2 MG/ML IV SOLN
2.0000 mg | Freq: Once | INTRAVENOUS | Status: AC
  Administered 2020-05-10: 2 mg via INTRAVENOUS
  Filled 2020-05-10: qty 1

## 2020-05-10 MED ORDER — BUPIVACAINE HCL (PF) 0.5 % IJ SOLN
INTRAMUSCULAR | Status: AC
  Filled 2020-05-10: qty 30

## 2020-05-10 MED ORDER — DEXMEDETOMIDINE (PRECEDEX) IN NS 20 MCG/5ML (4 MCG/ML) IV SYRINGE
PREFILLED_SYRINGE | INTRAVENOUS | Status: DC | PRN
  Administered 2020-05-10 (×2): 8 ug via INTRAVENOUS
  Administered 2020-05-10: 4 ug via INTRAVENOUS
  Administered 2020-05-10: 12 ug via INTRAVENOUS
  Administered 2020-05-10: 8 ug via INTRAVENOUS

## 2020-05-10 MED ORDER — ROCURONIUM BROMIDE 10 MG/ML (PF) SYRINGE
PREFILLED_SYRINGE | INTRAVENOUS | Status: DC | PRN
  Administered 2020-05-10 (×2): 50 mg via INTRAVENOUS
  Administered 2020-05-10: 20 mg via INTRAVENOUS

## 2020-05-10 MED ORDER — FENTANYL CITRATE (PF) 250 MCG/5ML IJ SOLN
INTRAMUSCULAR | Status: DC | PRN
  Administered 2020-05-10 (×2): 100 ug via INTRAVENOUS

## 2020-05-10 MED ORDER — ROCURONIUM BROMIDE 10 MG/ML (PF) SYRINGE
PREFILLED_SYRINGE | INTRAVENOUS | Status: AC
  Filled 2020-05-10: qty 10

## 2020-05-10 MED ORDER — PROPOFOL 10 MG/ML IV BOLUS
INTRAVENOUS | Status: AC
  Filled 2020-05-10: qty 40

## 2020-05-10 MED ORDER — SUGAMMADEX SODIUM 200 MG/2ML IV SOLN
INTRAVENOUS | Status: DC | PRN
  Administered 2020-05-10 (×2): 100 mg via INTRAVENOUS

## 2020-05-10 MED ORDER — PROTAMINE SULFATE 10 MG/ML IV SOLN
INTRAVENOUS | Status: DC | PRN
  Administered 2020-05-10: 90 mg via INTRAVENOUS

## 2020-05-10 MED ORDER — OXYCODONE HCL 5 MG/5ML PO SOLN: 5.0000 mg | Freq: Once | ORAL | Status: DC | PRN

## 2020-05-10 MED ORDER — FENTANYL CITRATE (PF) 100 MCG/2ML IJ SOLN
INTRAMUSCULAR | Status: AC
  Filled 2020-05-10: qty 2

## 2020-05-10 MED ORDER — ROCURONIUM BROMIDE 10 MG/ML (PF) SYRINGE
PREFILLED_SYRINGE | INTRAVENOUS | Status: AC
  Filled 2020-05-10: qty 20

## 2020-05-10 MED ORDER — LIDOCAINE 2% (20 MG/ML) 5 ML SYRINGE
INTRAMUSCULAR | Status: AC
  Filled 2020-05-10: qty 5

## 2020-05-10 MED ORDER — METOPROLOL TARTRATE 5 MG/5ML IV SOLN
2.5000 mg | Freq: Four times a day (QID) | INTRAVENOUS | Status: DC | PRN
  Administered 2020-05-11 – 2020-06-03 (×12): 2.5 mg via INTRAVENOUS
  Filled 2020-05-10 (×13): qty 5

## 2020-05-10 MED ORDER — SODIUM CHLORIDE 0.9 % IV SOLN
INTRAVENOUS | Status: DC | PRN
  Administered 2020-05-10: 500 mL

## 2020-05-10 MED ORDER — DEXAMETHASONE SODIUM PHOSPHATE 10 MG/ML IJ SOLN
INTRAMUSCULAR | Status: AC
  Filled 2020-05-10: qty 1

## 2020-05-10 MED ORDER — ONDANSETRON HCL 4 MG/2ML IJ SOLN: 4.0000 mg | Freq: Once | INTRAMUSCULAR | Status: DC | PRN

## 2020-05-10 MED ORDER — DEXAMETHASONE SODIUM PHOSPHATE 10 MG/ML IJ SOLN
INTRAMUSCULAR | Status: DC | PRN
Start: 2020-05-10 — End: 2020-05-10
  Administered 2020-05-10: 4 mg via INTRAVENOUS

## 2020-05-10 MED ORDER — SODIUM CHLORIDE 0.9 % IV SOLN: INTRAVENOUS | Status: DC | PRN

## 2020-05-10 MED ORDER — BUPIVACAINE HCL (PF) 0.5 % IJ SOLN
INTRAMUSCULAR | Status: DC | PRN
Start: 2020-05-10 — End: 2020-05-10

## 2020-05-10 MED ORDER — PHENYLEPHRINE HCL-NACL 10-0.9 MG/250ML-% IV SOLN
INTRAVENOUS | Status: DC | PRN
Start: 2020-05-10 — End: 2020-05-10
  Administered 2020-05-10: 25 ug/min via INTRAVENOUS

## 2020-05-10 MED ORDER — OXYCODONE HCL 5 MG PO TABS: 5.0000 mg | ORAL_TABLET | Freq: Once | ORAL | Status: DC | PRN

## 2020-05-10 MED ORDER — MIDAZOLAM HCL 5 MG/5ML IJ SOLN
INTRAMUSCULAR | Status: DC | PRN
  Administered 2020-05-10: 2 mg via INTRAVENOUS

## 2020-05-10 MED ORDER — SODIUM CHLORIDE 0.9 % IV SOLN
INTRAVENOUS | Status: AC
  Filled 2020-05-10: qty 1.2

## 2020-05-10 MED ORDER — FENTANYL CITRATE (PF) 100 MCG/2ML IJ SOLN
25.0000 ug | INTRAMUSCULAR | Status: DC | PRN
  Administered 2020-05-10 (×3): 50 ug via INTRAVENOUS

## 2020-05-10 MED ORDER — ONDANSETRON HCL 4 MG/2ML IJ SOLN
INTRAMUSCULAR | Status: AC
  Filled 2020-05-10: qty 2

## 2020-05-10 MED ORDER — 0.9 % SODIUM CHLORIDE (POUR BTL) OPTIME
TOPICAL | Status: DC | PRN
  Administered 2020-05-10: 1000 mL

## 2020-05-10 MED ORDER — ONDANSETRON HCL 4 MG/2ML IJ SOLN
INTRAMUSCULAR | Status: DC | PRN
Start: 2020-05-10 — End: 2020-05-10
  Administered 2020-05-10: 4 mg via INTRAVENOUS

## 2020-05-10 MED ORDER — MINERAL OIL LIGHT 100 % EX OIL
TOPICAL_OIL | CUTANEOUS | Status: DC | PRN
  Administered 2020-05-10: 1 via TOPICAL

## 2020-05-10 MED ORDER — PROPOFOL 10 MG/ML IV BOLUS
INTRAVENOUS | Status: DC | PRN
  Administered 2020-05-10: 170 mg via INTRAVENOUS

## 2020-05-10 MED ORDER — MIDAZOLAM HCL 2 MG/2ML IJ SOLN
INTRAMUSCULAR | Status: AC
  Filled 2020-05-10: qty 2

## 2020-05-10 SURGICAL SUPPLY — 56 items
ANGIOVAC CIRCUIT GEN3 (MISCELLANEOUS) ×2
BAG BANDED W/RUBBER/TAPE 36X54 (MISCELLANEOUS) ×2 IMPLANT
CANISTER SUCT 3000ML PPV (MISCELLANEOUS) ×2 IMPLANT
CANNULA C180 ANGIOVAC 180 DEG (CANNULA) ×2 IMPLANT
CANNULA C20 ANGIOVAC 20 DEG (CANNULA) IMPLANT
CANNULA OPTISITE PERFUSION 16F (CANNULA) IMPLANT
CANNULA OPTISITE PERFUSION 18F (CANNULA) ×2 IMPLANT
CANNULA OPTISITE PERFUSION 20F (CANNULA) IMPLANT
CATH ROBINSON RED A/P 18FR (CATHETERS) ×2 IMPLANT
CLIP VESOCCLUDE MED 24/CT (CLIP) IMPLANT
CLIP VESOCCLUDE SM WIDE 24/CT (CLIP) IMPLANT
COVER PROBE W GEL 5X96 (DRAPES) ×2 IMPLANT
DERMABOND ADVANCED (GAUZE/BANDAGES/DRESSINGS) ×2
DERMABOND ADVANCED .7 DNX12 (GAUZE/BANDAGES/DRESSINGS) ×2 IMPLANT
DRAPE C-ARM 42X72 X-RAY (DRAPES) ×2 IMPLANT
DRAPE INCISE IOBAN 66X45 STRL (DRAPES) IMPLANT
DRYSEAL FLEXSHEATH 18FR 33CM (SHEATH)
DRYSEAL FLEXSHEATH 26FR 33CM (SHEATH) ×1
ELECT REM PT RETURN 9FT ADLT (ELECTROSURGICAL) ×2
ELECTRODE REM PT RTRN 9FT ADLT (ELECTROSURGICAL) ×1 IMPLANT
FELT TEFLON 1X6 (MISCELLANEOUS) ×2 IMPLANT
GAUZE SPONGE 4X4 12PLY STRL LF (GAUZE/BANDAGES/DRESSINGS) ×4 IMPLANT
GLOVE BIO SURGEON STRL SZ7 (GLOVE) ×2 IMPLANT
GLOVE BIOGEL PI IND STRL 6.5 (GLOVE) ×3 IMPLANT
GLOVE BIOGEL PI INDICATOR 6.5 (GLOVE) ×3
GOWN STRL REUS W/ TWL LRG LVL3 (GOWN DISPOSABLE) ×3 IMPLANT
GOWN STRL REUS W/ TWL XL LVL3 (GOWN DISPOSABLE) ×1 IMPLANT
GOWN STRL REUS W/TWL LRG LVL3 (GOWN DISPOSABLE) ×3
GOWN STRL REUS W/TWL XL LVL3 (GOWN DISPOSABLE) ×1
GUIDEWIRE AMPLATZ STIFF 0.35 (WIRE) ×2 IMPLANT
KIT BASIN OR (CUSTOM PROCEDURE TRAY) ×2 IMPLANT
KIT DILATOR VASC 18G NDL (KITS) ×2 IMPLANT
KIT TURNOVER KIT B (KITS) ×2 IMPLANT
NEEDLE HYPO 25GX1X1/2 BEV (NEEDLE) IMPLANT
PACK CIRCUIT ANGIOVAC GEN3 (MISCELLANEOUS) ×1 IMPLANT
PACK ENDO MINOR (CUSTOM PROCEDURE TRAY) ×2 IMPLANT
PAD ARMBOARD 7.5X6 YLW CONV (MISCELLANEOUS) ×4 IMPLANT
POSITIONER HEAD DONUT 9IN (MISCELLANEOUS) IMPLANT
PUMP SARN DELFIN (MISCELLANEOUS) ×2 IMPLANT
SET MICROPUNCTURE 5F STIFF (MISCELLANEOUS) ×2 IMPLANT
SHEATH DRYSEAL FLEX 18FR 33CM (SHEATH) IMPLANT
SHEATH DRYSEAL FLEX 26FR 33CM (SHEATH) ×1 IMPLANT
SHEATH PINNACLE 8F 10CM (SHEATH) ×4 IMPLANT
SPONGE LAP 18X18 RF (DISPOSABLE) ×2 IMPLANT
STOPCOCK 4 WAY LG BORE MALE ST (IV SETS) IMPLANT
SUT ETHIBOND 2 0 SH (SUTURE) ×2
SUT ETHIBOND 2 0 SH 36X2 (SUTURE) ×2 IMPLANT
SUT ETHIBOND X763 2 0 SH 1 (SUTURE) ×4 IMPLANT
SUT PROLENE 6 0 BV (SUTURE) IMPLANT
SUT SILK  1 MH (SUTURE) ×2
SUT SILK 1 MH (SUTURE) ×2 IMPLANT
SYR CONTROL 10ML LL (SYRINGE) IMPLANT
TAPE CLOTH SURG 4X10 WHT LF (GAUZE/BANDAGES/DRESSINGS) ×4 IMPLANT
TOWEL GREEN STERILE (TOWEL DISPOSABLE) ×4 IMPLANT
WATER STERILE IRR 1000ML POUR (IV SOLUTION) IMPLANT
WIRE AMPLATZ SS-J .035X180CM (WIRE) ×2 IMPLANT

## 2020-05-10 NOTE — Brief Op Note (Signed)
04/28/2020 - 05/10/2020  9:20 AM  PATIENT:  Clifford Clark  29 y.o. male  PRE-OPERATIVE DIAGNOSIS:  Tricuspid valve endocarditis  POST-OPERATIVE DIAGNOSIS:  Tricuspid valve endocarditis  PROCEDURE:  TRANSESOPHAGEAL ECHOCARDIOGRAM (TEE), APPLICATION OF ANGIOVAC   SURGEON:  Surgeon(s) and Role: Panel 1:    Lightfoot, Eliezer Lofts, MD - Primary Panel 2:    [name], MD - Primary  PHYSICIAN ASSISTANT: Doree Fudge PA-C  ANESTHESIA:   general  EBL:  minimal   BLOOD ADMINISTERED: PRBCs as was anemic pre op  DRAINS: none   SPECIMEN:  Source of Specimen:  Tricuspid valve vegetation  DISPOSITION OF SPECIMEN:  Culture, AFB, fungal  COUNTS CORRECT:  YES  DICTATION: .Dragon Dictation  PLAN OF CARE: Admit to inpatient   PATIENT DISPOSITION:  PACU - hemodynamically stable.   Delay start of Pharmacological VTE agent (>24hrs) due to surgical blood loss or risk of bleeding: yes

## 2020-05-10 NOTE — Progress Notes (Signed)
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 homeless, asthma who presented to the ER after being found on the ground sleeping by a friend he stated.  EMS was called and he was transported to the hospital. In the ER with multiple imaging studies including x-ray of his right hand and wrist, x-ray of left hand, CXR, CT chest/abdomen/pelvis, CT C-spine, and CT head.  There were no acute fractures in his bilateral upper extremities.  CT head also unremarkable.  Remainder of his imaging studies revealed what appeared to be septic pulmonary emboli as well as septic emboli involving both kidneys. Cultures were drawn and he was started on vancomycin, cefepime, and Flagyl in the ER - 4/4 blood cultures were growing MRSA. TTE also performed on 7/9 revealing TV vegetation. Became verbally abusive/threatening 05/01/20 and Psych was consulted and a sitter was requested.  7/12 Patient developed worsening swelling in his right wrist and left hand. There was concern for septic joint or bursitis. A CT was ordered bilaterally which did show bilateral abscesses.  7/13 Bilateral I&D complaining of pain - admits to taking Carfentanyl at home/off the street.  7/16 TEE shows large tricuspid vegetation -  transfer to [facility] for CT Sx to evaluate for angiovac 7/20 went to OR with CVTS: Debridement of right atrial mass - Debridement of tricuspid valve vegetation   Interval History/ROS:  Sleepy after procedure  Assessment & Plan: Principal Problem:   Endocarditis of tricuspid valve Active Problems:   IVDU (intravenous drug user)   Septic embolism (HCC)   Sepsis (HCC)   Thrombocytopenia (HCC)   Hypokalemia   Hyponatremia   MRSA bacteremia   Malnutrition of moderate degree   Threatening behavior   Hand  abscess   Elevated BUN   Pressure injury of skin   MSSA bacteremia with concurrent endocarditis in the setting of IV drug abuse - TTE performed on 04/29/20: TV vegetation (55mm) noted as well as possible veg on PV - TEE shows large mobile vegetation on tricuspid valve and small vegetation on pulmonic valve with no PFO - CT surgery possible angi[facility]ac at [facility] on 05/18/20 pending scheduling - MRI T/L spine negative for acute findings - ID following appreciate insight/recommendations - will need minimum 6 weeks IV antibiotics - changed to cefazolin 05/05/20 - Blood cultures finalized over the past 48h now showing MSSA (previously labeled MRSA on BCID in error) Trend BC until clear (7/10 and 7/14 cultures remain NGTD) prior to PICC placement; patient will need to remain in hospital for duration of treatment course; no indication for IVC if tries to leave AMA but PICC will have to be removed -pain control difficult-- will try to alternate PO and IV  Acute respiratory failure -x ray on 7/16 shows: Numerous small, stable masslike opacities scattered throughout both Lungs -monitor closely -wean O2 as able  Hand abscess, bilateral, s/p I&D - Likely secondary to above - Bilateral abscesses on hands, s/p CT on 7/11 - Appreciate hand surgery consult; I&D on 7/13 -encouraged him to keep elevated for swelling  Sepsis (HCC) secondary to above - See MSSA bacteremia  Acute metabolic encephalopathy, resolved - Considered due to IV drugs as well as sepsis/infection - Mentation has cleared and diet started  Anemia, likely multifactorial Acute (questionably blood loss versus dilutional) on chronic anemia of likely chronic disease -Iron deficiency notable on labs, cannot rule out malnutrition or anemia of chronic disease concurrently.  -Decrease lab draws to every 72 hours in hopes to avoid bloodletting the patient -Status post 1 unit PRBC (7/16)  Thrombocytopenia (HCC) - Likely due to  acute illness/sepsis; cannot rule out other etiologies yet - Trend CBC with infection treatment; slowly improving   Hyponatremia - Replete and recheck as needed  Elevated LFTs - Possibly due to current infection/sepsis vs risk for hepatitis from IV drug use - Hepatitis panel noted for hep C Ab positive -  HCV Quant: Low/undetectable - LFTs improving   Hypokalemia, resolved -Repleted  Malnutrition of moderate degree Per RD: "Moderate Malnutrition related to social / environmental circumstances as evidenced by energy intake < 75% for > or equal to 3 months, moderate fat depletion, moderate muscle depletion, percent weight loss."  Threatening behavior - on 7/11, patient endorsed multiple homicidal ideations to nurse and CNA in room; he threatened that he was going "to kill the attending" and all their family and kids; this was voiced several times despite nursing attempting to re-direct patient - psych eval requested - no evidence of imminent risk to self/others  AKI, Resolved - BUN now downtrending appropriately; creatinine stable  Pressure Injury 05/06/20 Coccyx Medial Stage 2 -  Partial thickness loss of dermis presenting as a shallow open injury with a red, pink wound bed without slough. (Active)  05/06/20 0600  Location: Coccyx  Location Orientation: Medial  Staging: Stage 2 -  Partial thickness loss of dermis presenting as a shallow open injury with a red, pink wound bed without slough.  Wound Description (Comments):   Present on Admission:   unable to determine if POA due to documentation    Antimicrobials: Cefazolin 05/05/2020>> ongoing Vanc 04/28/20 >> 7/15 Cefepime 04/28/20 - 04/29/20 Flagyl 04/28/20 - 04/29/20  DVT prophylaxis: Lovenox Code Status: Full  Status is: Inpatient  Remains inpatient appropriate because:Ongoing active pain requiring inpatient pain management, Unsafe d/c plan, IV treatments appropriate due to intensity of illness or inability to take PO and  Inpatient level of care appropriate due to severity of illness   Dispo: The patient is from: Home              Anticipated d/c is to: Home              Anticipated d/c date is: > 3 days              Patient currently is not medically stable to d/c.  Objective: Blood pressure 119/88, pulse (!) 102, temperature 97.7 F (36.5 C), resp. rate (!) 22, height 5\' 7"  (1.702 m), weight 55 kg, SpO2 96 %.   Intake/Output Summary (Last 24 hours) at 05/10/2020 1414 Last data filed at 05/10/2020 1328 Gross per 24 hour  Intake 2379.09 ml  Output 3575 ml  Net -1195.91 ml   Last Weight  Most recent update: 04/28/2020  9:43 PM   Weight  55 kg (121 lb 4.1 oz)           Examination:   General: Appearance:    Frail male in no acute distress, appears more comfortable than prior days     Lungs:     respirations unlabored  Heart:    Tachycardic. Normal rhythm. No murmurs, rubs, or gallops.   MS:   All extremities are intact.   Neurologic:   Sleepy s/p  procedure      Consultants:   ID  Hand surgery/orthopedic surgery  Cardiology  Cardiothoracic surgery  Procedures:   TTE 04/29/2020  TEE 05/06/2020  Data Reviewed:  Results for orders placed or performed during the hospital encounter of 04/28/20 (from the past 24 hour(s))  Prepare RBC (crossmatch)     Status: None   Collection Time: 05/10/20  6:22 AM  Result Value Ref Range   Order Confirmation      ORDER PROCESSED BY BLOOD BANK Performed at [facility], [address] [place], [place] [address]   Aerobic/Anaerobic Culture (surgical/deep wound)     Status: None (Preliminary result)   Collection Time: 05/10/20  9:10 AM   Specimen: Soft Tissue, Other  Result Value Ref Range   Specimen Description TISSUE    Special Requests TRICUSPID VALVE VEGETATION    Gram Stain      RARE WBC PRESENT, PREDOMINANTLY PMN ABUNDANT GRAM POSITIVE COCCI IN PAIRS IN CLUSTERS Performed at [facility], [address] [place], [place]  [address]    Culture PENDING    Report Status PENDING     Recent Results (from the past 240 hour(s))  Fungus Culture With Stain     Status: None (Preliminary result)   Collection Time: 05/03/20  9:35 AM   Specimen: Soft Tissue, Other  Result Value Ref Range Status   Fungus Stain Final report  Final    Comment: (NOTE) Performed At: [facility] [address] [place], [place] [id number] Jolene Schimke MD QM:[id number]    Fungus (Mycology) Culture PENDING  Incomplete   Fungal Source TISSUE  Final    Comment: RT WRIST Performed at [facility], [address] [place], [place] [address]   Aerobic/Anaerobic Culture (surgical/deep wound)     Status: None   Collection Time: 05/03/20  9:35 AM   Specimen: Soft Tissue, Other  Result Value Ref Range Status   Specimen Description   Final    TISSUE RT WRIST Performed at [facility] [address] [place], [place] [address]    Special Requests   Final    PODIATRY [place] Performed at [facility], [address], [place], [place] [address]    Gram Stain   Final    RARE WBC PRESENT,BOTH PMN AND MONONUCLEAR RARE GRAM POSITIVE COCCI IN PAIRS IN CLUSTERS    Culture   Final    RARE STAPHYLOCOCCUS AUREUS NO ANAEROBES ISOLATED Performed at [facility], [address] [place], [place] [address]    Report Status 05/08/2020 FINAL  Final   Organism ID, Bacteria STAPHYLOCOCCUS AUREUS  Final      Susceptibility   Staphylococcus aureus - MIC*    CIPROFLOXACIN <=0.5 SENSITIVE Sensitive     ERYTHROMYCIN >=8 RESISTANT Resistant     GENTAMICIN <=0.5 SENSITIVE Sensitive     OXACILLIN 0.5 SENSITIVE Sensitive     TETRACYCLINE <=1 SENSITIVE Sensitive     VANCOMYCIN <=0.5 SENSITIVE Sensitive     TRIMETH/SULFA <=10 SENSITIVE Sensitive     CLINDAMYCIN <=0.25 SENSITIVE Sensitive     RIFAMPIN <=0.5 SENSITIVE Sensitive     Inducible Clindamycin NEGATIVE Sensitive     * RARE  STAPHYLOCOCCUS AUREUS  Fungus Culture Result     Status: None   Collection Time: 05/03/20  9:35 AM  Result Value Ref Range Status   Result 1 Comment  Final    Comment: (NOTE) KOH/Calcofluor preparation:  no fungus observed. Performed  At: [facility] [address] [place], [place] [id number] Jolene Schimke MD MW:[id number]   Fungus Culture With Stain     Status: None (Preliminary result)   Collection Time: 05/03/20  9:38 AM   Specimen: Soft Tissue, Other  Result Value Ref Range Status   Fungus Stain Final report  Final    Comment: (NOTE) Performed At: [facility] [address] [place], [place] [id number] Jolene Schimke MD DG:[id number]    Fungus (Mycology) Culture PENDING  Incomplete   Fungal Source TISSUE  Final    Comment: LT HAND Performed at [facility], [address], [place], [place] [address]   Aerobic/Anaerobic Culture (surgical/deep wound)     Status: None   Collection Time: 05/03/20  9:38 AM   Specimen: Soft Tissue, Other  Result Value Ref Range Status   Specimen Description   Final    TISSUE LT HAND Performed at [facility], [address] [place], [place] [address]    Special Requests   Final    PATIENT ON FOLLOWING ANCEF, [facility] Performed at [facility], [address] [place], [place] [address]    Gram Stain   Final    NO WBC SEEN RARE GRAM POSITIVE COCCI IN PAIRS IN CLUSTERS    Culture   Final    FEW STAPHYLOCOCCUS AUREUS NO ANAEROBES ISOLATED Performed at [facility], [address] [place], [place] [address]    Report Status 05/08/2020 FINAL  Final   Organism ID, Bacteria STAPHYLOCOCCUS AUREUS  Final      Susceptibility   Staphylococcus aureus - MIC*    CIPROFLOXACIN <=0.5 SENSITIVE Sensitive     ERYTHROMYCIN >=8 RESISTANT Resistant     GENTAMICIN <=0.5 SENSITIVE Sensitive     OXACILLIN 0.5 SENSITIVE Sensitive     TETRACYCLINE <=1 SENSITIVE Sensitive      VANCOMYCIN 1 SENSITIVE Sensitive     TRIMETH/SULFA <=10 SENSITIVE Sensitive     CLINDAMYCIN <=0.25 SENSITIVE Sensitive     RIFAMPIN <=0.5 SENSITIVE Sensitive     Inducible Clindamycin NEGATIVE Sensitive     * FEW STAPHYLOCOCCUS AUREUS  Fungus Culture Result     Status: None   Collection Time: 05/03/20  9:38 AM  Result Value Ref Range Status   Result 1 Comment  Final    Comment: (NOTE) KOH/Calcofluor preparation:  no fungus observed. Performed At: [facility] [address] [place], [place] [id number] Jolene Schimke MD RK:[id number]   Culture, blood (Routine X 2) w Reflex to ID Panel     Status: None   Collection Time: 05/04/20  5:19 PM   Specimen: BLOOD  Result Value Ref Range Status   Specimen Description   Final    BLOOD RIGHT ANTECUBITAL Performed at [facility], [address], [place], [place] [address]    Special Requests   Final    BOTTLES DRAWN AEROBIC AND ANAEROBIC Blood Culture adequate volume Performed at [facility], [address] [place], [place] [address]    Culture   Final    NO GROWTH 5 DAYS Performed at [facility], [address] [place], [place] [address]    Report Status 05/09/2020 FINAL  Final  Culture, blood (Routine X 2) w Reflex to ID Panel     Status: None   Collection Time: 05/04/20  5:19 PM   Specimen: BLOOD  Result Value Ref Range Status   Specimen Description   Final  BLOOD LEFT ANTECUBITAL Performed at [facility], [address] [place], [place] [address]    Special Requests   Final    BOTTLES DRAWN AEROBIC ONLY Blood Culture adequate volume Performed at [facility], [address] [place], [place] [address]    Culture   Final    NO GROWTH 5 DAYS Performed at [facility], [address] [place], [place] [address]    Report Status 05/09/2020 FINAL  Final  Aerobic/Anaerobic Culture (surgical/deep wound)     Status: None  (Preliminary result)   Collection Time: 05/10/20  9:10 AM   Specimen: Soft Tissue, Other  Result Value Ref Range Status   Specimen Description TISSUE  Final   Special Requests TRICUSPID VALVE VEGETATION  Final   Gram Stain   Final    RARE WBC PRESENT, PREDOMINANTLY PMN ABUNDANT GRAM POSITIVE COCCI IN PAIRS IN CLUSTERS Performed at [facility], [address] [place], [place] [address]    Culture PENDING  Incomplete   Report Status PENDING  Incomplete     Radiology Studies: DG CHEST PORT 1 VIEW  Result Date: 05/10/2020 CLINICAL DATA:  Central line placement EXAM: PORTABLE CHEST 1 VIEW COMPARISON:  05/09/2020 FINDINGS: Interval placement of a left IJ approach central venous catheter with distal tip terminating at the level of the proximal SVC. Stable cardiomediastinal contours. Diffuse nodular opacities throughout both lungs, many of which are peripherally distributed and similar to the prior exam. Superimposed interstitial opacities which likely reflect pulmonary edema, also similar to the prior study. No appreciable pleural fluid collection or pneumothorax. IMPRESSION: 1. Interval placement of a left IJ approach central venous catheter with distal tip terminating at the level of the proximal SVC. 2. Similar appearance of diffuse nodular opacities throughout both lungs and with likely superimposed interstitial edema. Electronically Signed   By: Duanne GuessNicholas  Plundo D.O.   On: 05/10/2020 11:02   DG Chest Portable 1 View  Result Date: 05/09/2020 CLINICAL DATA:  Endocarditis EXAM: PORTABLE CHEST 1 VIEW COMPARISON:  05/06/2020, CT 04/28/2020 FINDINGS: Lung volumes are small, but are symmetric and pulmonary insufflation has remained stable since prior examination. Multiple nodular opacities are again seen peripherally, bilaterally in keeping with the cavitary nodule seen on prior CT examination. There is superimposed mild interstitial pulmonary infiltrate, progressive since prior examination. No  pneumothorax or pleural effusion. Cardiac size within normal limits. No acute bone abnormality IMPRESSION: Progressive interstitial pulmonary edema. Grossly stable peripheral pulmonary opacities in keeping with known septic emboli. Electronically Signed   By: Helyn NumbersAshesh  Parikh MD   On: 05/09/2020 15:26   DG C-Arm 1-60 Min-No Report  Result Date: 05/10/2020 Fluoroscopy was utilized by the requesting physician.  No radiographic interpretation.   DG CHEST PORT 1 VIEW  Final Result    DG C-Arm 1-60 Min-No Report  Final Result    DG Chest Portable 1 View  Final Result    DG CHEST PORT 1 VIEW  Final Result    MR Lumbar Spine W Wo Contrast  Final Result    MR THORACIC SPINE W WO CONTRAST  Final Result    DG CHEST PORT 1 VIEW  Final Result    DG CHEST PORT 1 VIEW  Final Result    CT WRIST RIGHT W CONTRAST  Final Result    CT HAND LEFT W CONTRAST  Final Result    US Abdomen Limited RUQ  Final Result    CT Abdomen Pelvis W Contrast  Final  Result    CT Head Wo Contrast  Final Result    CT Cervical Spine Wo Contrast  Final Result    CT Chest W Contrast  Final Result    DG Chest Port 1 View  Final Result    DG Wrist Complete Right  Final Result    DG Hand Complete Right  Final Result    DG Hand Complete Left  Final Result       Scheduled Meds: . Chlorhexidine Gluconate Cloth  6 each Topical Q0600  . docusate sodium  100 mg Oral BID  . [START ON 05/11/2020] enoxaparin (LOVENOX) injection  40 mg Subcutaneous Q24H  . fentaNYL      . fentaNYL      . mouth rinse  15 mL Mouth Rinse BID  . multivitamin with minerals  1 tablet Oral Daily  . Ensure Max Protein  11 oz Oral Daily  . sodium chloride flush  3 mL Intravenous Q12H   PRN Meds: acetaminophen **OR** acetaminophen, ipratropium-albuterol, LORazepam, metoprolol tartrate, morphine injection, ondansetron **OR** ondansetron (ZOFRAN) IV, oxyCODONE Continuous Infusions: .  ceFAZolin (ANCEF) IV 2 g (05/10/20 1328)      LOS: 12 days  Time spent:  Joseph Art, DO [facility] 05/10/2020, 2:14 PM  Contact via secure chat.

## 2020-05-10 NOTE — Anesthesia Procedure Notes (Signed)
Procedure Name: Intubation Date/Time: 05/10/2020 7:45 AM Performed by: Rachel Moulds, CRNA Pre-anesthesia Checklist: Patient identified, Emergency Drugs available, Suction available and Patient being monitored Patient Re-evaluated:Patient Re-evaluated prior to induction Oxygen Delivery Method: Circle System Utilized Preoxygenation: Pre-oxygenation with 100% oxygen Induction Type: IV induction Ventilation: Mask ventilation without difficulty Laryngoscope Size: Miller and 2 Grade View: Grade II Tube type: Oral Tube size: 7.5 mm Number of attempts: 1 Airway Equipment and Method: Stylet Placement Confirmation: ETT inserted through vocal cords under direct vision,  positive ETCO2 and breath sounds checked- equal and bilateral Secured at: 22 cm Tube secured with: Tape Dental Injury: Teeth and Oropharynx as per pre-operative assessment

## 2020-05-10 NOTE — Progress Notes (Signed)
Pt is in OR during shift change.

## 2020-05-10 NOTE — Progress Notes (Signed)
Pt HR sustaining between 130's and 140's and pt complaining of constant generalized pain. Scheduled metoprolol given with no change to HR. On call TRH paged. New orders for one time dose of morphine 2 mg and lactated ringers to be started at 125/hr. Will continue to monitor.

## 2020-05-10 NOTE — Anesthesia Preprocedure Evaluation (Addendum)
Anesthesia Evaluation  Patient identified by MRN, date of birth, ID band Patient awake    Reviewed: Allergy & Precautions, NPO status , Patient's Chart, lab work & pertinent test results  Airway Mallampati: II  TM Distance: >3 FB Neck ROM: Full    Dental  (+) Teeth Intact, Poor Dentition   Pulmonary former smoker,    breath sounds clear to auscultation       Cardiovascular  Rhythm:Regular Rate:Tachycardia     Neuro/Psych    GI/Hepatic   Endo/Other    Renal/GU      Musculoskeletal   Abdominal   Peds  Hematology   Anesthesia Other Findings   Reproductive/Obstetrics                            Anesthesia Physical Anesthesia Plan  ASA: III  Anesthesia Plan: General   Post-op Pain Management:    Induction: Intravenous  PONV Risk Score and Plan:   Airway Management Planned: Oral ETT  Additional Equipment: Arterial line and CVP  Intra-op Plan:   Post-operative Plan: Extubation in OR  Informed Consent: I have reviewed the patients History and Physical, chart, labs and discussed the procedure including the risks, benefits and alternatives for the proposed anesthesia with the patient or authorized representative who has indicated his/her understanding and acceptance.       Plan Discussed with: CRNA and Anesthesiologist  Anesthesia Plan Comments:        Anesthesia Quick Evaluation

## 2020-05-10 NOTE — Progress Notes (Signed)
     [address]       [place] [address]             [phone]       No events overnight  Vitals:   05/09/20 2324 05/10/20 0500  BP: 113/85 133/87  Pulse: (!) 115 (!) 129  Resp: (!) 30 (!) 34  Temp: 98.7 F (37.1 C) 99.3 F (37.4 C)  SpO2: 100% 99%   Alert NAD Sinus tach tachypnic  OR today for angiovac debridement of the tricuspid valve Will place femoral A-line intraop.  Janie Strothman Keane Scrape

## 2020-05-10 NOTE — Op Note (Signed)
      [address]       [name] [address]             [phone]          08/19/2019   Patient:   Clifford Clark Pre-Op Dx:     Tricuspid valve endocarditis   MSSA bacteremia                         Sepsis                         Septic pulmonary emboli   Post-op Dx:  same Procedure: -Right femoral vein cannulation with a 18 F cannula - Right internal jugular vein cannulation with a 37F Sheath - Right heart cannulation - Debridement of right atrial mass - Debridement of tricuspid valve vegetation   Surgeon and Role:      * Yoon Barca, Eliezer Lofts, MD - Primary    *D. Joycelyn Man, PA-C - assisting Anesthesia  general EBL:   150 ml Blood Administration:  1 unit of packed red blood cells Specimen:  Tricuspid valve vegetation   Indications: The patient admitted to the hospital with tricuspid valve endocarditis and MSSA bacteremia.  Due to ongoing drug use, the patient was not a good surgical candidate for valve replacement.  The patient did however have a large tricuspid valve vegetation and evidence of multiple septic pulmonary emboli.  Catheter-based debridement of the tricuspid valve vegetation was recommended.   Findings: Multiple tricuspid valve vegetation on the anterior leaflet.  Good debridement was achieved.  Tricuspid valve regurgitation remain unchanged following the procedure.   Operative Technique: After the risks, benefits and alternatives were thoroughly discussed, the patient was brought to the operative theatre.  Anesthesia was induced, and she was prepped and draped in normal sterile fashion.  An appropriate surgical pause was performed and preoperative antibiotics were dosed accordingly.   We began with ultrasound-guided cannulation of the right femoral vein using a micropuncture set.  We confirmed that our wire was in the IVC using fluoroscopy.  After systemically heparinizing the patient, the venotomy was then sequentially dilated over wire and  our 18 French catheter was in place.  This catheter was then connected to the angiovac circuit.   Next we moved to the right internal jugular vein.  Using ultrasound guidance and a micropuncture set we accessed the vein.  Wire was then threaded down the SVC into the heart and down into the abdominal IVC.  The tract was then dilated sequentially using fluoroscopy.  In the 26 [place] dry seal sheath was then inserted.  After we confirmed therapeutic ACT, the ECMO circuit was initiated and we used the Angiovac to debride the tricuspid valve with TEE guidance.   After achieving an optimal result we discontinued our procedure, and returned the remaining blood from the Angiovac circuit to the patient.  The catheters were removed and the sites were closed with a pledgeted mattress suture.  Pressure was held while heparinization was reversed with protamine.   The patient tolerated the procedure without any immediate complications, and was transferred to the PACU in stable condition.   Janica Eldred Keane Scrape

## 2020-05-10 NOTE — Anesthesia Postprocedure Evaluation (Signed)
Anesthesia Post Note  Patient: Clifford Clark  Procedure(s) Performed: APPLICATION OF ANGIOVAC (N/A ) TRANSESOPHAGEAL ECHOCARDIOGRAM (TEE) (N/A )     Patient location during evaluation: PACU Anesthesia Type: General Level of consciousness: awake and alert Pain management: pain level controlled Vital Signs Assessment: post-procedure vital signs reviewed and stable Respiratory status: spontaneous breathing, nonlabored ventilation, respiratory function stable and patient connected to nasal cannula oxygen Cardiovascular status: blood pressure returned to baseline and stable Postop Assessment: no apparent nausea or vomiting Anesthetic complications: no   No complications documented.  Last Vitals:  Vitals:   05/10/20 1055 05/10/20 1100  BP: 115/83   Pulse: (!) 112 (!) 110  Resp: (!) 26 (!) 30  Temp:    SpO2: 95% 95%    Last Pain:  Vitals:   05/10/20 1141  TempSrc:   PainSc: 5                  Vallen Calabrese COKER

## 2020-05-10 NOTE — Transfer of Care (Signed)
Immediate Anesthesia Transfer of Care Note  Patient: Clifford Clark  Procedure(s) Performed: APPLICATION OF ANGIOVAC (N/A ) TRANSESOPHAGEAL ECHOCARDIOGRAM (TEE) (N/A )  Patient Location: PACU  Anesthesia Type:General  Level of Consciousness: awake, drowsy and patient cooperative  Airway & Oxygen Therapy: Patient Spontanous Breathing and Patient connected to face mask oxygen  Post-op Assessment: Report given to RN, Post -op Vital signs reviewed and stable and Patient moving all extremities  Post vital signs: Reviewed and stable  Last Vitals:  Vitals Value Taken Time  BP 119/87 05/10/20 0940  Temp 36.6 C 05/10/20 0940  Pulse 109 05/10/20 0942  Resp 23 05/10/20 0942  SpO2 100 % 05/10/20 0942  Vitals shown include unvalidated device data.  Last Pain:  Vitals:   05/10/20 0500  TempSrc: Oral  PainSc:       Patients Stated Pain Goal: 0 (05/09/20 1807)  Complications: No complications documented.

## 2020-05-10 NOTE — Plan of Care (Signed)

## 2020-05-10 NOTE — Anesthesia Procedure Notes (Signed)
Central Venous Catheter Insertion Performed by: Kipp Brood, MD, anesthesiologist Start/End7/20/2021 6:50 AM, 05/10/2020 7:00 AM Patient location: Pre-op. Preanesthetic checklist: patient identified, IV checked, site marked, risks and benefits discussed, surgical consent, monitors and equipment checked, pre-op evaluation, timeout performed and anesthesia consent Lidocaine 1% used for infiltration and patient sedated Hand hygiene performed  and maximum sterile barriers used  Catheter size: 8 Fr Total catheter length 16. Central line was placed.Double lumen Procedure performed using ultrasound guided technique. Ultrasound Notes:image(s) printed for medical record Attempts: 1 Following insertion, dressing applied and line sutured. Post procedure assessment: blood return through all ports  Patient tolerated the procedure well with no immediate complications.

## 2020-05-10 NOTE — Progress Notes (Signed)
Back from PACU awake and alert. 

## 2020-05-11 ENCOUNTER — Inpatient Hospital Stay (HOSPITAL_COMMUNITY): Payer: Self-pay

## 2020-05-11 DIAGNOSIS — M549 Dorsalgia, unspecified: Secondary | ICD-10-CM

## 2020-05-11 DIAGNOSIS — I330 Acute and subacute infective endocarditis: Secondary | ICD-10-CM

## 2020-05-11 DIAGNOSIS — B9561 Methicillin susceptible Staphylococcus aureus infection as the cause of diseases classified elsewhere: Secondary | ICD-10-CM

## 2020-05-11 DIAGNOSIS — R519 Headache, unspecified: Secondary | ICD-10-CM

## 2020-05-11 DIAGNOSIS — M542 Cervicalgia: Secondary | ICD-10-CM

## 2020-05-11 DIAGNOSIS — F1910 Other psychoactive substance abuse, uncomplicated: Secondary | ICD-10-CM

## 2020-05-11 DIAGNOSIS — I76 Septic arterial embolism: Secondary | ICD-10-CM

## 2020-05-11 DIAGNOSIS — R52 Pain, unspecified: Secondary | ICD-10-CM

## 2020-05-11 LAB — ACID FAST SMEAR (AFB, MYCOBACTERIA): Acid Fast Smear: NEGATIVE

## 2020-05-11 MED ORDER — METOPROLOL TARTRATE 12.5 MG HALF TABLET
12.5000 mg | ORAL_TABLET | Freq: Two times a day (BID) | ORAL | Status: DC
  Administered 2020-05-11 – 2020-05-12 (×2): 12.5 mg via ORAL
  Filled 2020-05-11 (×2): qty 1

## 2020-05-11 NOTE — Progress Notes (Signed)
Nutrition Follow-up  RD working remotely.  DOCUMENTATION CODES:   Non-severe (moderate) malnutrition in context of social or environmental circumstances  INTERVENTION:   - Recommend obtaining updated weight  - Continue Ensure Max po daily, each supplement provides 150 kcal and 30 grams of protein  - Continue MVI with minerals daily  - Magic Cup BID with meals, each supplement provides 290 kcal and 9 grams of protein  - Double protein portions TID with meals  NUTRITION DIAGNOSIS:   Moderate Malnutrition related to social / environmental circumstances as evidenced by energy intake < 75% for > or equal to 3 months, moderate fat depletion, moderate muscle depletion, percent weight loss.  Ongoing  GOAL:   Patient will meet greater than or equal to 90% of their needs  Progressing  MONITOR:   PO intake, Supplement acceptance, Labs, Weight trends  REASON FOR ASSESSMENT:   Consult Assessment of nutrition requirement/status  ASSESSMENT:   Pt admitted for multiple septic emboli suspicious for possible endocarditis in the setting of IV drug use resulting in sepsis. PMH significant for polysubstance abuse.  7/13 - s/p I&D of bilateral hand abscesses 7/16 - s/p TEE confirming TV vegetation and trace pulmonic valve vegetation 7/20 - s/p angiovac debridement of tricuspid valve  RD unable to reach pt via phone call to room. No new weights available since admission.  Pt accepting most Ensure Max supplements per [facility] documentation. RD will add Magic Cups BID to lunch and dinner meal trays given variable PO intake. Will also order double protein portions.  Per RN edema assessment, pt with mild pitting generalized edema, mild pitting edema to BUE, and moderate pitting edema to BLE.  Meal Completion: 0-100% x last 6 documented meals  Medications reviewed and include: colace, MVI with minerals, Ensure Max daily, IV abx  Labs reviewed: hemoglobin 8.2  UOP: 2025 ml x 24  hours I/O's: -3.2 L since admit  Diet Order:   Diet Order            Diet regular Room service appropriate? Yes; Fluid consistency: Thin  Diet effective now                 EDUCATION NEEDS:   Not appropriate for education at this time  Skin:  Skin Assessment: Skin Integrity Issues: Stage II: coccyx Incisions: bilateral arms, groin, neck  Last BM:  05/07/20  Height:   Ht Readings from Last 1 Encounters:  04/28/20 5\' 7"  (1.702 m)    Weight:   Wt Readings from Last 1 Encounters:  04/28/20 55 kg    BMI:  Body mass index is 18.99 kg/m.  Estimated Nutritional Needs:   Kcal:  1800-2000  Protein:  85-95 grams  Fluid:  >1.8L/d    06/29/20, MS, RD, LDN Inpatient Clinical Dietitian Please see [facility] for contact information.

## 2020-05-11 NOTE — Plan of Care (Signed)

## 2020-05-11 NOTE — Progress Notes (Signed)
Kept taking out pulse ox probe, explained the importance of monitoring oxygen saturation but non-compliant at times. Continue to monitor.

## 2020-05-11 NOTE — Progress Notes (Addendum)
      [address]       [place]iff[place] [address]             [phone]        1 Day Post-Op Procedure(s) (LRB): APPLICATION OF ANGIOVAC (N/A) TRANSESOPHAGEAL ECHOCARDIOGRAM (TEE) (N/A)  Subjective: Patient thirsty this morning. He asked how surgery went yesterday.  Objective: Vital signs in last 24 hours: Temp:  [97.7 F (36.5 C)-99.2 F (37.3 C)] 98.4 F (36.9 C) (07/21 0348) Pulse Rate:  [93-116] 100 (07/21 0348) Cardiac Rhythm: Sinus tachycardia (07/21 0430) Resp:  [20-36] 28 (07/21 0348) BP: (108-120)/(73-91) 120/88 (07/21 0348) SpO2:  [95 %-100 %] 96 % (07/21 0348) Arterial Line BP: (129-148)/(77-98) 146/97 (07/20 1100)   Current Weight  04/28/20 55 kg       Intake/Output from previous day: 07/20 0701 - 07/21 0700 In: 2493 [P.O.:860; I.V.:903; Blood:630; IV Piggyback:100] Out: 2075 [Urine:2025; Blood:50]   Physical Exam:  Cardiovascular: Tachcardic Pulmonary: Clear to auscultation bilaterally Wounds: Dressings are clean and dry.    Lab Results: CBC: Recent Labs    05/09/20 0307 05/09/20 0307 05/10/20 0802 05/10/20 0926  WBC 14.7*  --   --   --   HGB 7.2*   < > 6.5* 8.2*  HCT 22.8*   < > 19.0* 24.0*  PLT 178  --   --   --    < > = values in this interval not displayed.   BMET:  Recent Labs    05/09/20 0307 05/09/20 0307 05/10/20 0802 05/10/20 0926  NA 136   < > 138 138  K 4.3   < > 4.2 4.3  CL 102  --   --   --   CO2 25  --   --   --   GLUCOSE 116*  --   --   --   BUN 10  --   --   --   CREATININE 0.73  --   --   --   CALCIUM 7.7*  --   --   --    < > = values in this interval not displayed.    PT/INR:  Lab Results  Component Value Date   INR 1.4 (H) 04/28/2020   ABG:  INR: Will add last result for INR, ABG once components are confirmed Will add last 4 CBG results once components are confirmed  Assessment/Plan:  1. CV - ST this am. 2.  Pulmonary - On 3 liters of oxygen via [place]. Wean as able.  3. 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 Anemia-etiology multifactorial. Last H and H 8.2 and 24 5. Remove foley 6. Management per primary. Will remove neck and right groin sutures in am  Clifford M ZimmermanPA-C 05/11/2020,7:16 AM  Agree with above Will remove sutures tomorrow Rest per primary  Clifford Clark

## 2020-05-11 NOTE — Progress Notes (Signed)
Offered to give him a wash and to change linen but refused. Only allowed facial wash.

## 2020-05-11 NOTE — Progress Notes (Signed)
Pt claimed that he voided this pm and somebody emptied his urinal but no documentation noted. Refused bladder scan to be done . Denied any bladder discomfort.continue to monitor.

## 2020-05-11 NOTE — Progress Notes (Signed)
PROGRESS NOTE    Clifford Clark  UXL:[id number] DOB: 1991/08/25 DOA: 04/28/2020 PCP: Patient, [name]    Chief Complaint  Patient presents with  . 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 homeless, asthma who presented to the ER after being found on the ground sleeping by a friend he stated.  EMS was called and he was transported to the hospital. In the ER with multiple imaging studies including x-ray of his right hand and wrist, x-ray of left hand, CXR, CT chest/abdomen/pelvis, CT C-spine, and CT head.  There were no acute fractures in his bilateral upper extremities.  CT head also unremarkable.  Remainder of his imaging studies revealed what appeared to be septic pulmonary emboli as well as septic emboli involving both kidneys. Cultures were drawn and he was started on vancomycin, cefepime, and Flagyl in the ER - 4/4 blood cultures were growing MRSA. TTE also performed on 7/9 revealing TV vegetation. Became verbally abusive/threatening 05/01/20 and Psych was consulted and a sitter was requested.  7/12 Patient developed worsening swelling in his right wrist and left hand. There was concern for septic joint or bursitis. A CT was ordered bilaterally which did show bilateral abscesses. He is transferred from [place] to [place] for orthoconsult 7/13 Bilateral I&D by Dr Eulah Pont, complaining of pain - admits to taking Carfentanyl at home/off the street.  7/16 TEE shows large tricuspid vegetation -  transfer to [facility] for CT Sx to evaluate for angiovac 7/20 went to OR with CVTS: Debridement of right atrial mass - Debridement of tricuspid valve vegetation  Subjective:   S/p Debridement of right atrial mass and Debridement of tricuspid valve vegetation yesterday Report headache, neck pain, back pain, getting as needed analgesic No  fever He is on 3 L of oxygen supplement, he denies short of breath, no cough noticed,  He is happy that his mother brought in desert Mother at bedside  Assessment & Plan:   Principal Problem:   Endocarditis of tricuspid valve Active Problems:   IVDU (intravenous drug user)   Septic embolism (HCC)   Sepsis (HCC)   Thrombocytopenia (HCC)   Hypokalemia   Hyponatremia   MRSA bacteremia   Malnutrition of moderate degree   Threatening behavior   Hand abscess   Elevated BUN   Pressure injury of skin  MSSA bacteremia with concurrent endocarditis in the setting of IV drug abuse - TTE performed on 04/29/20: TV vegetation (14mm) noted as well as possible veg on PV - TEE shows large mobile vegetation on tricuspid valve and small vegetation on pulmonic valve with no PFO -  - MRI T/L spine negative for acute findings - ID following appreciate insight/recommendations - will need minimum 6 weeks IV antibiotics - changed to cefazolin 05/05/20 - Blood cultures finalized over the past 48h now showing MSSA (previously labeled MRSA on BCID in error) Trend BC until clear (7/10 and 7/14 cultures remain NGTD) prior to PICC placement; patient will need to remain in hospital for duration of treatment course; no indication for IVC if tries to leave AMA but PICC will have to be removed -pain control difficult-- will try to alternate PO and IV -S/p angiovac on 7/ 21, will follow CT surgery recommendation  Acute respiratory failure, septic emboli -x ray on 7/16 shows: Numerous small, stable  masslike opacities scattered throughout both Lungs -monitor closely -wean O2 as able  Hand abscess, bilateral, s/p I&D - Likely secondary to above - Bilateral abscesses on hands, s/p CT on 7/11 - Appreciate hand surgery consult; I&D on 7/13 -encouraged him to keep elevated for swelling  Sepsis (HCC) secondary to above, present on admission - See MSSA bacteremia  Acute metabolic encephalopathy, present on  admission ,resolved - Considered due to IV drugs as well as sepsis/infection - Mentation has cleared and diet started   Anemia, likely multifactorial Acute (questionably blood loss versus dilutional) on chronic anemia of likely chronic disease -Iron deficiency notable on labs, cannot rule out malnutrition or anemia of chronic disease concurrently.  -Decrease lab draws to every 72 hours in hopes to avoid bloodletting the patient -Status post 1 unit PRBC (7/16)  Thrombocytopenia (HCC) - Likely due to acute illness/sepsis; cannot rule out other etiologies yet - Trend CBC with infection treatment; normalized  Hyponatremia - Replete and recheck as needed -Normalized  Elevated LFTs - Possibly due to current infection/sepsis vs risk for hepatitis from IV drug use - Hepatitis panel noted for hep C Ab positive, hep C quantitative at 30,  -  HCV Quant: Low/undetectable - LFTs improving   Hypokalemia, resolved -Repleted  Malnutrition of moderate degree Per RD: "Moderate Malnutritionrelated to social / environmental circumstancesas evidenced by energy intake < 75% for > or equal to 3 months, moderate fat depletion, moderate muscle depletion, percent weight loss."  Threatening behavior - on 7/11, patient endorsed multiple homicidal ideations to nurse and CNA in room; he threatened that he was going "to kill the attending" and all their family and kids; this was voiced several times despite nursing attempting to re-direct patient - psych eval requested - no evidence of imminent risk to self/others Per psychiatry evaluation: "Patient is not suicidal, homicidal or psychotic and does not need inpatient psychiatric care.  Crisis and safety planning done it in length by NP prior to recommending discharge"   AKI, Resolved - BUN now downtrending appropriately; creatinine stable  Pressure Injury 05/06/20 Coccyx Medial Stage 2 -  Partial thickness loss of dermis presenting as a shallow  open injury with a red, pink wound bed without slough. (Active)  05/06/20 0600  Location: Coccyx  Location Orientation: Medial  Staging: Stage 2 -  Partial thickness loss of dermis presenting as a shallow open injury with a red, pink wound bed without slough.  Wound Description (Comments):   Present on Admission:   unable to determine if POA due to documentation      DVT prophylaxis: enoxaparin (LOVENOX) injection 40 mg Start: 05/11/20 0800 SCD's Start: 05/03/20 1058   Code Status: Full Family Communication: Mother at bedside with permission Disposition:   Status is: Inpatient    Dispo: The patient is from: Home              Anticipated d/c is to: To be determined              Anticipated d/c date is: Not ready to discharge, getting IV antibiotics, need thoracic surgery and ID clearance                Consultants:   Thoracic surgery  Orthopedic surgery Dr. Eulah Pont  Infectious disease  Psychiatry on July 12  Cardiology for TEE  Procedures:  BILATERAL IRRIGATION AND DEBRIDEMENT HANDS on 7/13 by Dr. Eulah Pont TEE 7/16 Debridement of right atrial mass and  Debridement of tricuspid valve vegetation by thoracic surgery Dr.  Lightfoot on July 20  Antimicrobials:   Ancef from July 15 ongoing Vanc 04/28/20 >> 7/15 Cefepime 04/28/20 - 04/29/20 Flagyl 04/28/20 - 04/29/20    Objective: Vitals:   05/10/20 2300 05/11/20 0348 05/11/20 0716 05/11/20 1115  BP: 119/84 120/88 124/89   Pulse: (!) 110 100 (!) 110 (!) 110  Resp: (!) 30 (!) 28 (!) 28   Temp: 98.1 F (36.7 C) 98.4 F (36.9 C) 98.2 F (36.8 C)   TempSrc: Oral Oral Oral   SpO2: 100% 96% 98% 98%  Weight:      Height:        Intake/Output Summary (Last 24 hours) at 05/11/2020 1256 Last data filed at 05/11/2020 0926 Gross per 24 hour  Intake 963 ml  Output 1750 ml  Net -787 ml   Filed Weights   04/28/20 2130  Weight: 55 kg    Examination:  General exam: calm, NAD Respiratory system:  Respiratory effort  normal.  Cardiovascular system: Sinus tachycardia Gastrointestinal system: Abdomen is nondistended, soft and nontender. No organomegaly or masses felt. Normal bowel sounds heard. Central nervous system: Alert and oriented. No focal neurological deficits. Extremities: Bilateral hand dressing intact, left hand edema has resolved, right hand edema has improved by report Skin: No rashes, lesions or ulcers Psychiatry: Judgement and insight appear normal. Mood & affect appropriate.     Data Reviewed: I have personally reviewed following labs and imaging studies  CBC: Recent Labs  Lab 05/05/20 0217 05/05/20 0217 05/06/20 0220 05/06/20 1107 05/09/20 0307 05/10/20 0802 05/10/20 0926  WBC 16.5*  --  13.8*  --  14.7*  --   --   HGB 7.4*   < > 6.8* 8.8* 7.2* 6.5* 8.2*  HCT 23.0*   < > 21.3* 26.0* 22.8* 19.0* 24.0*  MCV 85.8  --  86.2  --  88.4  --   --   PLT 221  --  214  --  178  --   --    < > = values in this interval not displayed.    Basic Metabolic Panel: Recent Labs  Lab 05/05/20 0217 05/05/20 0217 05/06/20 0220 05/06/20 1107 05/09/20 0307 05/10/20 0802 05/10/20 0926  NA 134*   < > 135 138 136 138 138  K 4.5   < > 4.7 4.6 4.3 4.2 4.3  CL 104  --  106 102 102  --   --   CO2 24  --  25  --  25  --   --   GLUCOSE 124*  --  94 90 116*  --   --   BUN 24*  --  17 15 10   --   --   CREATININE 0.73  --  0.65 0.60* 0.73  --   --   CALCIUM 7.9*  --  7.6*  --  7.7*  --   --    < > = values in this interval not displayed.    GFR: Estimated Creatinine Clearance: 106 mL/min (by C-G formula based on SCr of 0.73 mg/dL).  Liver Function Tests: Recent Labs  Lab 05/05/20 0217 05/06/20 0220 05/09/20 0307  AST 37 35 25  ALT 37 33 16  ALKPHOS 69 67 69  BILITOT 0.6 0.4 0.5  PROT 5.3* 5.3* 5.6*  ALBUMIN 1.5* 1.6* 1.4*    CBG: No results for input(s): GLUCAP in the last 168 hours.   Recent Results (from the past 240 hour(s))  Fungus Culture With Stain     Status: None  (  Preliminary result)   Collection Time: 05/03/20  9:35 AM   Specimen: Soft Tissue, Other  Result Value Ref Range Status   Fungus Stain Final report  Final    Comment: (NOTE) Performed At: [facility] [address] [place], [place] [id number] Jolene Schimke MD GD:[id number]    Fungus (Mycology) Culture PENDING  Incomplete   Fungal Source TISSUE  Final    Comment: RT WRIST Performed at [facility], [address], [place], [place] [address]   Aerobic/Anaerobic Culture (surgical/deep wound)     Status: None   Collection Time: 05/03/20  9:35 AM   Specimen: Soft Tissue, Other  Result Value Ref Range Status   Specimen Description   Final    TISSUE RT WRIST Performed at [facility], [address] [place], [place] [address]    Special Requests   Final    PODIATRY [place] Performed at [facility], [address] [place], [place] [address]    Gram Stain   Final    RARE WBC PRESENT,BOTH PMN AND MONONUCLEAR RARE GRAM POSITIVE COCCI IN PAIRS IN CLUSTERS    Culture   Final    RARE STAPHYLOCOCCUS AUREUS NO ANAEROBES ISOLATED Performed at [facility], [address], [place], [place] [address]    Report Status 05/08/2020 FINAL  Final   Organism ID, Bacteria STAPHYLOCOCCUS AUREUS  Final      Susceptibility   Staphylococcus aureus - MIC*    CIPROFLOXACIN <=0.5 SENSITIVE Sensitive     ERYTHROMYCIN >=8 RESISTANT Resistant     GENTAMICIN <=0.5 SENSITIVE Sensitive     OXACILLIN 0.5 SENSITIVE Sensitive     TETRACYCLINE <=1 SENSITIVE Sensitive     VANCOMYCIN <=0.5 SENSITIVE Sensitive     TRIMETH/SULFA <=10 SENSITIVE Sensitive     CLINDAMYCIN <=0.25 SENSITIVE Sensitive     RIFAMPIN <=0.5 SENSITIVE Sensitive     Inducible Clindamycin NEGATIVE Sensitive     * RARE STAPHYLOCOCCUS AUREUS  Fungus Culture Result     Status: None   Collection Time: 05/03/20  9:35 AM  Result Value Ref Range Status   Result 1  Comment  Final    Comment: (NOTE) KOH/Calcofluor preparation:  no fungus observed. Performed At: [facility] [address] [place], [place] [id number] Jolene Schimke MD SH:[id number]   Fungus Culture With Stain     Status: None (Preliminary result)   Collection Time: 05/03/20  9:38 AM   Specimen: Soft Tissue, Other  Result Value Ref Range Status   Fungus Stain Final report  Final    Comment: (NOTE) Performed At: [facility] [address] [place], [place] [id number] Jolene Schimke MD NO:[id number]    Fungus (Mycology) Culture PENDING  Incomplete   Fungal Source TISSUE  Final    Comment: LT HAND Performed at [facility], [address] [place], [place] [address]   Aerobic/Anaerobic Culture (surgical/deep wound)     Status: None   Collection Time: 05/03/20  9:38 AM   Specimen: Soft Tissue, Other  Result Value Ref Range Status   Specimen Description   Final    TISSUE LT HAND Performed at [facility], [address], [place], [place] [address]    Special Requests   Final    PATIENT ON FOLLOWING ANCEF, [facility] Performed at [facility], [address] [place], [place] [address]    Gram Stain   Final    NO WBC SEEN RARE GRAM POSITIVE  COCCI IN PAIRS IN CLUSTERS    Culture   Final    FEW STAPHYLOCOCCUS AUREUS NO ANAEROBES ISOLATED Performed at [facility], [address] [place], [place] [address]    Report Status 05/08/2020 FINAL  Final   Organism ID, Bacteria STAPHYLOCOCCUS AUREUS  Final      Susceptibility   Staphylococcus aureus - MIC*    CIPROFLOXACIN <=0.5 SENSITIVE Sensitive     ERYTHROMYCIN >=8 RESISTANT Resistant     GENTAMICIN <=0.5 SENSITIVE Sensitive     OXACILLIN 0.5 SENSITIVE Sensitive     TETRACYCLINE <=1 SENSITIVE Sensitive     VANCOMYCIN 1 SENSITIVE Sensitive     TRIMETH/SULFA <=10 SENSITIVE Sensitive     CLINDAMYCIN <=0.25 SENSITIVE Sensitive     RIFAMPIN <=0.5  SENSITIVE Sensitive     Inducible Clindamycin NEGATIVE Sensitive     * FEW STAPHYLOCOCCUS AUREUS  Fungus Culture Result     Status: None   Collection Time: 05/03/20  9:38 AM  Result Value Ref Range Status   Result 1 Comment  Final    Comment: (NOTE) KOH/Calcofluor preparation:  no fungus observed. Performed At: [facility] [address] [place], [place] [id number] Jolene SchimkeNagendra Sanjai MD XB:[id number]:[phone]   Culture, blood (Routine X 2) w Reflex to ID Panel     Status: None   Collection Time: 05/04/20  5:19 PM   Specimen: BLOOD  Result Value Ref Range Status   Specimen Description   Final    BLOOD RIGHT ANTECUBITAL Performed at [facility], [address] [place], [place] [address]    Special Requests   Final    BOTTLES DRAWN AEROBIC AND ANAEROBIC Blood Culture adequate volume Performed at [facility], [address] [place], [place] [address]    Culture   Final    NO GROWTH 5 DAYS Performed at [facility], [address] [place], [place] [address]    Report Status 05/09/2020 FINAL  Final  Culture, blood (Routine X 2) w Reflex to ID Panel     Status: None   Collection Time: 05/04/20  5:19 PM   Specimen: BLOOD  Result Value Ref Range Status   Specimen Description   Final    BLOOD LEFT ANTECUBITAL Performed at [facility], [address] [place], [place] [address]    Special Requests   Final    BOTTLES DRAWN AEROBIC ONLY Blood Culture adequate volume Performed at [facility], [address] [place], [place] [address]    Culture   Final    NO GROWTH 5 DAYS Performed at [facility], [address] [place], [place] [address]    Report Status 05/09/2020 FINAL  Final  Aerobic/Anaerobic Culture (surgical/deep wound)     Status: None (Preliminary result)   Collection Time: 05/10/20  9:10 AM   Specimen: Soft Tissue, Other  Result Value Ref Range Status   Specimen Description  TISSUE  Final   Special Requests TRICUSPID VALVE VEGETATION  Final   Gram Stain   Final    RARE WBC PRESENT, PREDOMINANTLY PMN ABUNDANT GRAM POSITIVE COCCI IN PAIRS IN CLUSTERS    Culture   Final    ABUNDANT STAPHYLOCOCCUS AUREUS SUSCEPTIBILITIES TO FOLLOW CULTURE REINCUBATED FOR BETTER GROWTH Performed at [facility], [address] [place], [place] [address]    Report Status PENDING  Incomplete  Acid Fast Smear (AFB)     Status: None   Collection Time: 05/10/20  9:10 AM   Specimen: Soft Tissue, Other  Result Value Ref Range Status   AFB Specimen Processing Concentration  Final   Acid Fast Smear Negative  Final    Comment: (NOTE) Performed At: [facility] [address] [place], [place] [id number] Jolene Schimke MD [id number]    Source (AFB) TRICUSPID VALVE VEGETATION  Final    Comment: Performed at [facility], [address] [place], [place] [address]         Radiology Studies: DG CHEST PORT 1 VIEW  Result Date: 05/10/2020 CLINICAL DATA:  Central line placement EXAM: PORTABLE CHEST 1 VIEW COMPARISON:  05/09/2020 FINDINGS: Interval placement of a left IJ approach central venous catheter with distal tip terminating at the level of the proximal SVC. Stable cardiomediastinal contours. Diffuse nodular opacities throughout both lungs, many of which are peripherally distributed and similar to the prior exam. Superimposed interstitial opacities which likely reflect pulmonary edema, also similar to the prior study. No appreciable pleural fluid collection or pneumothorax. IMPRESSION: 1. Interval placement of a left IJ approach central venous catheter with distal tip terminating at the level of the proximal SVC. 2. Similar appearance of diffuse nodular opacities throughout both lungs and with likely superimposed interstitial edema. Electronically Signed   By: Duanne Guess D.O.   On: 05/10/2020 11:02   DG C-Arm 1-60 Min-No Report  Result Date:  05/10/2020 Fluoroscopy was utilized by the requesting physician.  No radiographic interpretation.   ECHO INTRAOPERATIVE TEE  Result Date: 05/10/2020  *INTRAOPERATIVE TRANSESOPHAGEAL REPORT *  Patient Name:   Clifford Clark  Date of Exam: 05/10/2020 Medical Rec #:  [id number]      Height:       67.0 in Accession #:    [id number]     Weight:       121.3 lb Date of Birth:  [birth year]      BSA:          1.63 m Patient Age:    28 years       BP:           133/87 mmHg Patient Gender: M              HR:           120 bpm. Exam Location:  Anesthesiology Transesophogeal exam was perform intraoperatively during surgical procedure. Patient was closely monitored under general anesthesia during the entirety of examination. Indications:     application of Angiovac Performing Phys: [id number] HARRELL O LIGHTFOOT Complications: No known complications during this procedure. PRE-OP FINDINGS  Left Ventricle: The left ventricle has low normal systolic function, with an ejection fraction of 50-55%. The cavity size was normal. There is no increase in left ventricular wall thickness. Right Ventricle: The right ventricle has normal systolic function. The cavity was moderately enlarged. There is no increase in right ventricular wall thickness. Left Atrium: Left atrial size was normal in size. The left atrial appendage is well visualized and there is no evidence of thrombus present. Right Atrium: Right atrial size was dilated. Interatrial Septum: No atrial level shunt detected by color flow Doppler. Pericardium: A small pericardial effusion is present. Mitral Valve: The mitral valve is normal in structure. No thickening of the mitral valve leaflet. Mitral valve regurgitation is trivial by color flow Doppler. There is no evidence of mitral valve vegetation. There is No evidence of mitral stenosis. Tricuspid Valve: There were vegetations present on the tricuspid valve and appeared to involve all three of the tricuspid  leaflets. The leaflets  ranged in size 1.5 X 1.0 cm. in diameter.There was severe tricuspid regurgitation. With echo guidance the angiovac probe was imaged and directed over the tricuspid valve inflow. The tricuspid vegetations were significantly reduced in size following angiovac debridement of the tricuspid valve. There was persistent severe tricuspid regurgitation following debridement. Aortic Valve: The aortic valve is tricuspid Aortic valve regurgitation was not visualized by color flow Doppler. There is no evidence of aortic valve stenosis. There is no evidence of a vegetation on the aortic valve. Pulmonic Valve: The pulmonic valve was normal in structure No evidence of pumonic stenosis. Pulmonic valve regurgitation is not visualized by color flow Doppler.  Kipp Brood MD Electronically signed by Kipp Brood MD Signature Date/Time: 05/10/2020/6:05:39 PM    Final         Scheduled Meds: . Chlorhexidine Gluconate Cloth  6 each Topical Q0600  . docusate sodium  100 mg Oral BID  . enoxaparin (LOVENOX) injection  40 mg Subcutaneous Q24H  . mouth rinse  15 mL Mouth Rinse BID  . multivitamin with minerals  1 tablet Oral Daily  . Ensure Max Protein  11 oz Oral Daily  . sodium chloride flush  3 mL Intravenous Q12H   Continuous Infusions: .  ceFAZolin (ANCEF) IV 2 g (05/11/20 0506)     LOS: 13 days     Time spent: I have personally reviewed and interpreted on  05/11/2020 daily labs, tele strips, imagings as discussed above under date review session and assessment and plans.  I reviewed all nursing notes, pharmacy notes, consultant notes,  vitals, pertinent old records  I have discussed plan of care as described above with RN , patient and family on 05/11/2020  Voice Recognition /Dragon dictation system was used to create this note, attempts have been made to correct errors. Please contact the author with questions and/or clarifications.   Albertine Grates, MD PhD FACP [facility]  Available via Epic  secure chat 7am-7pm for nonurgent issues Please page for urgent issues To page the attending provider between 7A-7P or the covering provider during after hours 7P-7A, please log into the web site [email or web address] and access using universal [place] password for that web site. If you do not have the password, please call the hospital operator.    05/11/2020, 12:56 PM

## 2020-05-11 NOTE — Progress Notes (Signed)
Upper extremity venous has been completed.   Preliminary results in CV Proc.   Blanch Media 05/11/2020 3:19 PM

## 2020-05-11 NOTE — Progress Notes (Signed)
Subjective:  C/o new onset headaches and left-sided neck pain   Antibiotics:  Anti-infectives (From admission, onward)   Start     Dose/Rate Route Frequency Ordered Stop   05/05/20 1600  ceFAZolin (ANCEF) IVPB 2g/100 mL premix     Discontinue     2 g 200 mL/hr over 30 Minutes Intravenous Every 8 hours 05/05/20 1452     05/04/20 1200  vancomycin (VANCOCIN) IVPB 1000 mg/200 mL premix  Status:  Discontinued        1,000 mg 200 mL/hr over 60 Minutes Intravenous Every 12 hours 05/04/20 1045 05/04/20 1055   05/04/20 1200  vancomycin (VANCOREADY) IVPB 750 mg/150 mL  Status:  Discontinued        750 mg 150 mL/hr over 60 Minutes Intravenous Every 8 hours 05/04/20 1055 05/05/20 1452   05/03/20 0945  vancomycin (VANCOCIN) powder  Status:  Discontinued          As needed 05/03/20 0945 05/03/20 1057   05/03/20 0920  ceFAZolin (ANCEF) 2-4 GM/100ML-% IVPB       Note to Pharmacy: Clifford Clark, Clifford Clark   : cabinet override      05/03/20 0920 05/03/20 2129   05/02/20 1500  vancomycin (VANCOREADY) IVPB 750 mg/150 mL  Status:  Discontinued        750 mg 150 mL/hr over 60 Minutes Intravenous Every 12 hours 05/02/20 1425 05/04/20 1045   05/01/20 1330  vancomycin variable dose per unstable renal function (pharmacist dosing)  Status:  Discontinued         Does not apply See admin instructions 05/01/20 1330 05/03/20 0810   04/29/20 1000  vancomycin (VANCOREADY) IVPB 750 mg/150 mL  Status:  Discontinued        750 mg 150 mL/hr over 60 Minutes Intravenous Every 8 hours 04/29/20 0834 05/01/20 1329   04/29/20 0400  metroNIDAZOLE (FLAGYL) IVPB 500 mg  Status:  Discontinued        500 mg 100 mL/hr over 60 Minutes Intravenous Every 8 hours 04/28/20 2122 04/29/20 0819   04/29/20 0400  ceFEPIme (MAXIPIME) 2 g in sodium chloride 0.9 % 100 mL IVPB  Status:  Discontinued        2 g 200 mL/hr over 30 Minutes Intravenous Every 8 hours 04/28/20 2138 04/29/20 0819   04/29/20 0100  vancomycin (VANCOCIN) IVPB 1000  mg/200 mL premix  Status:  Discontinued        1,000 mg 200 mL/hr over 60 Minutes Intravenous Every 8 hours 04/28/20 2146 04/29/20 0833   04/28/20 1715  ceFEPIme (MAXIPIME) 2 g in sodium chloride 0.9 % 100 mL IVPB        2 g 200 mL/hr over 30 Minutes Intravenous  Once 04/28/20 1708 04/28/20 1903   04/28/20 1715  metroNIDAZOLE (FLAGYL) IVPB 500 mg        500 mg 100 mL/hr over 60 Minutes Intravenous  Once 04/28/20 1708 04/28/20 2111   04/28/20 1715  vancomycin (VANCOCIN) IVPB 1000 mg/200 mL premix        1,000 mg 200 mL/hr over 60 Minutes Intravenous  Once 04/28/20 1708 04/28/20 1947      Medications: Scheduled Meds: . Chlorhexidine Gluconate Cloth  6 each Topical Q0600  . docusate sodium  100 mg Oral BID  . enoxaparin (LOVENOX) injection  40 mg Subcutaneous Q24H  . mouth rinse  15 mL Mouth Rinse BID  . multivitamin with minerals  1 tablet Oral Daily  . Ensure Max Protein  11 oz Oral Daily  . sodium chloride flush  3 mL Intravenous Q12H   Continuous Infusions: .  ceFAZolin (ANCEF) IV 2 g (05/11/20 1327)   PRN Meds:.acetaminophen **OR** acetaminophen, ipratropium-albuterol, LORazepam, metoprolol tartrate, morphine injection, ondansetron **OR** ondansetron (ZOFRAN) IV, oxyCODONE    Objective: Weight change:   Intake/Output Summary (Last 24 hours) at 05/11/2020 1412 Last data filed at 05/11/2020 0926 Gross per 24 hour  Intake 863 ml  Output 1750 ml  Net -887 ml   Blood pressure 124/89, pulse (!) 110, temperature 98.2 F (36.8 C), temperature source Oral, resp. rate (!) 28, height  (1.702 m), weight 55 kg, SpO2 98 %. Temp:  [98.1 F (36.7 C)-99.2 F (37.3 C)] 98.2 F (36.8 C) (07/21 0716) Pulse Rate:  [93-110] 110 (07/21 1115) Resp:  [23-35] 28 (07/21 0716) BP: (110-124)/(76-90) 124/89 (07/21 0716) SpO2:  [96 %-100 %] 98 % (07/21 1115)  Physical Exam: General: Alert and awake, oriented x3, chronically ill-appearing. HEENT: anicteric sclera, EOMI, Clifford Clark has some  tenderness along his central line his neck CVS tachycardic, could not appreciate murmurs Chest: , Tachypneic but fairly clear Abdomen: soft non-distended,  Extremities: no edema or deformity noted bilaterally Skin: no rashes Neuro: nonfocal  CBC:    BMET Recent Labs    05/09/20 0307 05/09/20 0307 05/10/20 0802 05/10/20 0926  NA 136   < > 138 138  K 4.3   < > 4.2 4.3  CL 102  --   --   --   CO2 25  --   --   --   GLUCOSE 116*  --   --   --   BUN 10  --   --   --   CREATININE 0.73  --   --   --   CALCIUM 7.7*  --   --   --    < > = values in this interval not displayed.     Liver Panel  Recent Labs    05/09/20 0307  PROT 5.6*  ALBUMIN 1.4*  AST 25  ALT 16  ALKPHOS 69  BILITOT 0.5       Sedimentation Rate No results for input(s): ESRSEDRATE in the last 72 hours. C-Reactive Protein No results for input(s): CRP in the last 72 hours.  Micro Results: Recent Results (from the past 720 hour(s))  Culture, blood (routine x 2)     Status: Abnormal   Collection Time: 04/28/20  3:45 PM   Specimen: BLOOD  Result Value Ref Range Status   Specimen Description   Final    BLOOD LEFT ANTECUBITAL Performed at [facility], [address] [place], [place] [address]    Special Requests   Final    BOTTLES DRAWN AEROBIC AND ANAEROBIC Blood Culture adequate volume Performed at [facility], [address], [place], [place] [address]    Culture  Setup Time   Final    GRAM POSITIVE COCCI IN BOTH AEROBIC AND ANAEROBIC BOTTLES CRITICAL RESULT CALLED TO, READ BACK BY AND VERIFIED WITH: PHARMD M. Derrick Ravel [phone] FCP    Culture (A)  Final    STAPHYLOCOCCUS AUREUS METHICILLIN RESISTANT ORGANISM WAS NOT RECOVERED IN CULTURE. REPEATED FOR CONFIRMATION Performed at [facility], [address], [place], [place] [address]    Report Status 05/03/2020 FINAL  Final   Organism ID, Bacteria STAPHYLOCOCCUS AUREUS  Final       Susceptibility   Staphylococcus aureus - MIC*    CIPROFLOXACIN <=  0.5 SENSITIVE Sensitive     ERYTHROMYCIN >=8 RESISTANT Resistant     GENTAMICIN <=0.5 SENSITIVE Sensitive     OXACILLIN 0.5 SENSITIVE Sensitive     TETRACYCLINE <=1 SENSITIVE Sensitive     VANCOMYCIN <=0.5 SENSITIVE Sensitive     TRIMETH/SULFA <=10 SENSITIVE Sensitive     CLINDAMYCIN <=0.25 SENSITIVE Sensitive     RIFAMPIN <=0.5 SENSITIVE Sensitive     Inducible Clindamycin NEGATIVE Sensitive     * STAPHYLOCOCCUS AUREUS  Blood Culture ID Panel (Reflexed)     Status: Abnormal   Collection Time: 04/28/20  3:45 PM  Result Value Ref Range Status   Enterococcus species NOT DETECTED NOT DETECTED Final   Listeria monocytogenes NOT DETECTED NOT DETECTED Final   Staphylococcus species DETECTED (A) NOT DETECTED Final    Comment: CRITICAL RESULT CALLED TO, READ BACK BY AND VERIFIED WITH: PHARMD Lynder Parents [phone] FCP    Staphylococcus aureus (BCID) DETECTED (A) NOT DETECTED Final    Comment: Methicillin (oxacillin)-resistant Staphylococcus aureus (MRSA). MRSA is predictably resistant to beta-lactam antibiotics (except ceftaroline). Preferred therapy is vancomycin unless clinically contraindicated. Patient requires contact precautions if  hospitalized. CRITICAL RESULT CALLED TO, READ BACK BY AND VERIFIED WITH: PHARMD M. Derrick Ravel [phone] FCP    Methicillin resistance DETECTED (A) NOT DETECTED Final    Comment: CRITICAL RESULT CALLED TO, READ BACK BY AND VERIFIED WITH: PHARMD M. RENZ [id number] FCP    Streptococcus species NOT DETECTED NOT DETECTED Final   Streptococcus agalactiae NOT DETECTED NOT DETECTED Final   Streptococcus pneumoniae NOT DETECTED NOT DETECTED Final   Streptococcus pyogenes NOT DETECTED NOT DETECTED Final   Acinetobacter baumannii NOT DETECTED NOT DETECTED Final   Enterobacteriaceae species NOT DETECTED NOT DETECTED Final   Enterobacter cloacae complex NOT DETECTED NOT DETECTED Final   Escherichia coli  NOT DETECTED NOT DETECTED Final   Klebsiella oxytoca NOT DETECTED NOT DETECTED Final   Klebsiella pneumoniae NOT DETECTED NOT DETECTED Final   Proteus species NOT DETECTED NOT DETECTED Final   Serratia marcescens NOT DETECTED NOT DETECTED Final   Haemophilus influenzae NOT DETECTED NOT DETECTED Final   Neisseria meningitidis NOT DETECTED NOT DETECTED Final   Pseudomonas aeruginosa NOT DETECTED NOT DETECTED Final   Candida albicans NOT DETECTED NOT DETECTED Final   Candida glabrata NOT DETECTED NOT DETECTED Final   Candida krusei NOT DETECTED NOT DETECTED Final   Candida parapsilosis NOT DETECTED NOT DETECTED Final   Candida tropicalis NOT DETECTED NOT DETECTED Final    Comment: Performed at [facility], [address], [place], [place] [address]  Culture, blood (routine x 2)     Status: Abnormal   Collection Time: 04/28/20  4:13 PM   Specimen: BLOOD  Result Value Ref Range Status   Specimen Description   Final    BLOOD RIGHT ANTECUBITAL Performed at [facility], [address] [place], [place] [address]    Special Requests   Final    BOTTLES DRAWN AEROBIC AND ANAEROBIC Blood Culture adequate volume Performed at [facility], [address] [place], [place] [address]    Culture  Setup Time   Final    GRAM POSITIVE COCCI IN BOTH AEROBIC AND ANAEROBIC BOTTLES CRITICAL VALUE NOTED.  VALUE IS CONSISTENT WITH PREVIOUSLY REPORTED AND CALLED VALUE.    Culture (A)  Final    STAPHYLOCOCCUS AUREUS METHICILLIN RESISTANT ORGANISM WAS NOT RECOVERED IN CULTURE. REPEATED FOR CONFIRMATION Performed at [facility], [address] [place], [place] [address]    Report Status 05/03/2020 FINAL  Final   Organism ID, Bacteria STAPHYLOCOCCUS AUREUS  Final      Susceptibility   Staphylococcus aureus - MIC*    CIPROFLOXACIN <=0.5 SENSITIVE Sensitive     ERYTHROMYCIN >=8 RESISTANT Resistant     GENTAMICIN <=0.5 SENSITIVE Sensitive     OXACILLIN  0.5 SENSITIVE Sensitive     TETRACYCLINE <=1 SENSITIVE Sensitive     VANCOMYCIN <=0.5 SENSITIVE Sensitive     TRIMETH/SULFA <=10 SENSITIVE Sensitive     CLINDAMYCIN <=0.25 SENSITIVE Sensitive     RIFAMPIN <=0.5 SENSITIVE Sensitive     Inducible Clindamycin NEGATIVE Sensitive     * STAPHYLOCOCCUS AUREUS  Urine culture     Status: Abnormal   Collection Time: 04/28/20  7:00 PM   Specimen: In/Out Cath Urine  Result Value Ref Range Status   Specimen Description   Final    IN/OUT CATH URINE Performed at [facility], [address] [place], [place] [address]    Special Requests   Final    NONE Performed at [facility], [address] [place], [place] [address]    Culture >=100,000 COLONIES/mL STAPHYLOCOCCUS AUREUS (A)  Final   Report Status 05/01/2020 FINAL  Final   Organism ID, Bacteria STAPHYLOCOCCUS AUREUS (A)  Final      Susceptibility   Staphylococcus aureus - MIC*    CIPROFLOXACIN <=0.5 SENSITIVE Sensitive     GENTAMICIN <=0.5 SENSITIVE Sensitive     NITROFURANTOIN <=16 SENSITIVE Sensitive     OXACILLIN 0.5 SENSITIVE Sensitive     TETRACYCLINE <=1 SENSITIVE Sensitive     VANCOMYCIN <=0.5 SENSITIVE Sensitive     TRIMETH/SULFA <=10 SENSITIVE Sensitive     CLINDAMYCIN <=0.25 SENSITIVE Sensitive     RIFAMPIN <=0.5 SENSITIVE Sensitive     Inducible Clindamycin NEGATIVE Sensitive     * >=100,000 COLONIES/mL STAPHYLOCOCCUS AUREUS  SARS Coronavirus 2 by RT PCR (hospital order, performed in [facility] hospital lab) Nasopharyngeal Nasopharyngeal Swab     Status: None   Collection Time: 04/28/20  7:03 PM   Specimen: Nasopharyngeal Swab  Result Value Ref Range Status   SARS Coronavirus 2 NEGATIVE NEGATIVE Final    Comment: (NOTE) SARS-CoV-2 target nucleic acids are NOT DETECTED.  The SARS-CoV-2 RNA is generally detectable in upper and lower respiratory specimens during the acute phase of infection. The lowest concentration of SARS-CoV-2  viral copies this assay can detect is 250 copies / mL. A negative result does not preclude SARS-CoV-2 infection and should not be used as the sole basis for treatment or other patient management decisions.  A negative result may occur with improper specimen collection / handling, submission of specimen other than nasopharyngeal swab, presence of viral mutation(s) within the areas targeted by this assay, and inadequate number of viral copies (<250 copies / mL). A negative result must be combined with clinical observations, patient history, and epidemiological information.  Fact Sheet for Patients:   BoilerBrush.com.cy  Fact Sheet for Healthcare Providers: https://pope.com/  This test is not yet approved or  cleared by the [place] FDA and has been authorized for detection and/or diagnosis of SARS-CoV-2 by FDA under an Emergency Use Authorization (EUA).  This EUA will remain in effect (meaning this test can be used) for the duration of the COVID-19 declaration under Section 564(b)(1) of the Act, 21 U.S.C. section 360bbb-3(b)(1), unless the authorization is terminated or revoked sooner.  Performed at [facility],  [address], [place], [place] [address]   MRSA PCR Screening     Status: Abnormal   Collection Time: 04/28/20  7:45 PM   Specimen: Nasal Mucosa; Nasopharyngeal  Result Value Ref Range Status   MRSA by PCR POSITIVE (A) NEGATIVE Final    Comment:        The GeneXpert MRSA Assay (FDA approved for NASAL specimens only), is one component of a comprehensive MRSA colonization surveillance program. It is not intended to diagnose MRSA infection nor to guide or monitor treatment for MRSA infections. RESULT CALLED TO, READ BACK BY AND VERIFIED WITH: S SMITH AT 2314 ON 04/28/2020 BY MOSLEY,J  Performed at [facility], [address], [place], [place] [address]   Culture, blood (Routine  X 2) w Reflex to ID Panel     Status: None   Collection Time: 04/30/20  8:15 AM   Specimen: BLOOD  Result Value Ref Range Status   Specimen Description   Final    BLOOD LEFT ANTECUBITAL Performed at [facility], [address] [place], [place] [address]    Special Requests   Final    BOTTLES DRAWN AEROBIC AND ANAEROBIC Blood Culture adequate volume Performed at [facility], [address] [place], [place] [address]    Culture   Final    NO GROWTH 5 DAYS Performed at [facility], [address], [place], [place] [address]    Report Status 05/05/2020 FINAL  Final  Culture, blood (Routine X 2) w Reflex to ID Panel     Status: None   Collection Time: 04/30/20  8:15 AM   Specimen: BLOOD LEFT FOREARM  Result Value Ref Range Status   Specimen Description   Final    BLOOD LEFT FOREARM Performed at [facility], [address] [place], [place] [address]    Special Requests   Final    BOTTLES DRAWN AEROBIC ONLY Blood Culture adequate volume Performed at [facility], [address], [place], [place] [address]    Culture   Final    NO GROWTH 5 DAYS Performed at [facility], [address], [place], [place] [address]    Report Status 05/05/2020 FINAL  Final  Fungus Culture With Stain     Status: None (Preliminary result)   Collection Time: 05/03/20  9:35 AM   Specimen: Soft Tissue, Other  Result Value Ref Range Status   Fungus Stain Final report  Final    Comment: (NOTE) Performed At: [facility] [address] [place], [place] [id number] Jolene Schimke MD NU:[id number]    Fungus (Mycology) Culture PENDING  Incomplete   Fungal Source TISSUE  Final    Comment: RT WRIST Performed at [facility], [address], [place], [place] [address]   Aerobic/Anaerobic Culture (surgical/deep wound)     Status: None   Collection Time: 05/03/20  9:35 AM   Specimen: Soft Tissue, Other   Result Value Ref Range Status   Specimen Description   Final    TISSUE RT WRIST Performed at [facility], [address], [place], [place] [address]    Special Requests   Final    PODIATRY [place] Performed at [facility], [address] [place], [place] [address]    Gram Stain   Final    RARE WBC PRESENT,BOTH PMN AND MONONUCLEAR RARE GRAM POSITIVE COCCI IN PAIRS IN CLUSTERS    Culture  Final    RARE STAPHYLOCOCCUS AUREUS NO ANAEROBES ISOLATED Performed at [facility], [address] [place], [place] [address]    Report Status 05/08/2020 FINAL  Final   Organism ID, Bacteria STAPHYLOCOCCUS AUREUS  Final      Susceptibility   Staphylococcus aureus - MIC*    CIPROFLOXACIN <=0.5 SENSITIVE Sensitive     ERYTHROMYCIN >=8 RESISTANT Resistant     GENTAMICIN <=0.5 SENSITIVE Sensitive     OXACILLIN 0.5 SENSITIVE Sensitive     TETRACYCLINE <=1 SENSITIVE Sensitive     VANCOMYCIN <=0.5 SENSITIVE Sensitive     TRIMETH/SULFA <=10 SENSITIVE Sensitive     CLINDAMYCIN <=0.25 SENSITIVE Sensitive     RIFAMPIN <=0.5 SENSITIVE Sensitive     Inducible Clindamycin NEGATIVE Sensitive     * RARE STAPHYLOCOCCUS AUREUS  Fungus Culture Result     Status: None   Collection Time: 05/03/20  9:35 AM  Result Value Ref Range Status   Result 1 Comment  Final    Comment: (NOTE) KOH/Calcofluor preparation:  no fungus observed. Performed At: [facility] [address] [place], [place] [id number] Jolene Schimke MD WN:[id number]   Fungus Culture With Stain     Status: None (Preliminary result)   Collection Time: 05/03/20  9:38 AM   Specimen: Soft Tissue, Other  Result Value Ref Range Status   Fungus Stain Final report  Final    Comment: (NOTE) Performed At: [facility] [address] [place], [place] [id number] Jolene Schimke MD [id number]    Fungus (Mycology) Culture PENDING  Incomplete   Fungal Source TISSUE  Final     Comment: LT HAND Performed at [facility], [address], [place], [place] [address]   Aerobic/Anaerobic Culture (surgical/deep wound)     Status: None   Collection Time: 05/03/20  9:38 AM   Specimen: Soft Tissue, Other  Result Value Ref Range Status   Specimen Description   Final    TISSUE LT HAND Performed at [facility], [address] [place], [place] [address]    Special Requests   Final    PATIENT ON FOLLOWING ANCEF, [facility] Performed at [facility], [address] [place], [place] [address]    Gram Stain   Final    NO WBC SEEN RARE GRAM POSITIVE COCCI IN PAIRS IN CLUSTERS    Culture   Final    FEW STAPHYLOCOCCUS AUREUS NO ANAEROBES ISOLATED Performed at [facility], [address] [place], [place] [address]    Report Status 05/08/2020 FINAL  Final   Organism ID, Bacteria STAPHYLOCOCCUS AUREUS  Final      Susceptibility   Staphylococcus aureus - MIC*    CIPROFLOXACIN <=0.5 SENSITIVE Sensitive     ERYTHROMYCIN >=8 RESISTANT Resistant     GENTAMICIN <=0.5 SENSITIVE Sensitive     OXACILLIN 0.5 SENSITIVE Sensitive     TETRACYCLINE <=1 SENSITIVE Sensitive     VANCOMYCIN 1 SENSITIVE Sensitive     TRIMETH/SULFA <=10 SENSITIVE Sensitive     CLINDAMYCIN <=0.25 SENSITIVE Sensitive     RIFAMPIN <=0.5 SENSITIVE Sensitive     Inducible Clindamycin NEGATIVE Sensitive     * FEW STAPHYLOCOCCUS AUREUS  Fungus Culture Result     Status: None   Collection Time: 05/03/20  9:38 AM  Result Value Ref Range Status   Result 1 Comment  Final    Comment: (NOTE) KOH/Calcofluor preparation:  no fungus observed. Performed At: [facility] [address]  [place], [place] [id number] Jolene Schimke MD WU:[id number]   Culture, blood (Routine X 2) w Reflex to ID Panel     Status: None   Collection Time: 05/04/20  5:19 PM   Specimen: BLOOD  Result Value Ref Range Status   Specimen Description   Final    BLOOD RIGHT  ANTECUBITAL Performed at [facility], [address], [place], [place] [address]    Special Requests   Final    BOTTLES DRAWN AEROBIC AND ANAEROBIC Blood Culture adequate volume Performed at [facility], [address], [place], [place] [address]    Culture   Final    NO GROWTH 5 DAYS Performed at [facility], [address] [place], [place] [address]    Report Status 05/09/2020 FINAL  Final  Culture, blood (Routine X 2) w Reflex to ID Panel     Status: None   Collection Time: 05/04/20  5:19 PM   Specimen: BLOOD  Result Value Ref Range Status   Specimen Description   Final    BLOOD LEFT ANTECUBITAL Performed at [facility], [address], [place], [place] [address]    Special Requests   Final    BOTTLES DRAWN AEROBIC ONLY Blood Culture adequate volume Performed at [facility], [address], [place], [place] [address]    Culture   Final    NO GROWTH 5 DAYS Performed at [facility], [address], [place], [place] [address]    Report Status 05/09/2020 FINAL  Final  Aerobic/Anaerobic Culture (surgical/deep wound)     Status: None (Preliminary result)   Collection Time: 05/10/20  9:10 AM   Specimen: Soft Tissue, Other  Result Value Ref Range Status   Specimen Description TISSUE  Final   Special Requests TRICUSPID VALVE VEGETATION  Final   Gram Stain   Final    RARE WBC PRESENT, PREDOMINANTLY PMN ABUNDANT GRAM POSITIVE COCCI IN PAIRS IN CLUSTERS    Culture   Final    ABUNDANT STAPHYLOCOCCUS AUREUS SUSCEPTIBILITIES TO FOLLOW CULTURE REINCUBATED FOR BETTER GROWTH Performed at [facility], [address], [place], [place] [address]    Report Status PENDING  Incomplete  Acid Fast Smear (AFB)     Status: None   Collection Time: 05/10/20  9:10 AM   Specimen: Soft Tissue, Other  Result Value Ref Range Status   AFB Specimen Processing Concentration  Final   Acid Fast Smear  Negative  Final    Comment: (NOTE) Performed At: [facility] [address] [place], [place] [id number] Jolene Schimke MD [id number]    Source (AFB) TRICUSPID VALVE VEGETATION  Final    Comment: Performed at [facility], [address] [place], [place] [address]    Studies/Results: DG CHEST PORT 1 VIEW  Result Date: 05/10/2020 CLINICAL DATA:  Central line placement EXAM: PORTABLE CHEST 1 VIEW COMPARISON:  05/09/2020 FINDINGS: Interval placement of a left IJ approach central venous catheter with distal tip terminating at the level of the proximal SVC. Stable cardiomediastinal contours. Diffuse nodular opacities throughout both lungs, many of which are peripherally distributed and similar to the prior exam. Superimposed interstitial opacities which likely reflect pulmonary edema, also similar to the prior study. No appreciable pleural fluid collection or pneumothorax. IMPRESSION: 1. Interval placement of a left IJ approach central venous catheter with distal tip terminating at the level of the proximal SVC. 2. Similar appearance of diffuse nodular opacities throughout both  lungs and with likely superimposed interstitial edema. Electronically Signed   By: Duanne Guess D.O.   On: 05/10/2020 11:02   DG C-Arm 1-60 Min-No Report  Result Date: 05/10/2020 Fluoroscopy was utilized by the requesting physician.  No radiographic interpretation.   ECHO INTRAOPERATIVE TEE  Result Date: 05/10/2020  *INTRAOPERATIVE TRANSESOPHAGEAL REPORT *  Patient Name:   Clifford Clark  Date of Exam: 05/10/2020 Medical Rec #:  [id number]      Height:       67.0 in Accession #:    [id number]     Weight:       121.3 lb Date of Birth:  1991-07-18      BSA:          1.63 m Patient Age:    28 years       BP:           133/87 mmHg Patient Gender: M              HR:           120 bpm. Exam Location:  Anesthesiology Transesophogeal exam was perform intraoperatively during surgical procedure. Patient was closely  monitored under general anesthesia during the entirety of examination. Indications:     application of Angiovac Performing Phys: [id number] HARRELL O LIGHTFOOT Complications: No known complications during this procedure. PRE-OP FINDINGS  Left Ventricle: The left ventricle has low normal systolic function, with an ejection fraction of 50-55%. The cavity size was normal. There is no increase in left ventricular wall thickness. Right Ventricle: The right ventricle has normal systolic function. The cavity was moderately enlarged. There is no increase in right ventricular wall thickness. Left Atrium: Left atrial size was normal in size. The left atrial appendage is well visualized and there is no evidence of thrombus present. Right Atrium: Right atrial size was dilated. Interatrial Septum: No atrial level shunt detected by color flow Doppler. Pericardium: A small pericardial effusion is present. Mitral Valve: The mitral valve is normal in structure. No thickening of the mitral valve leaflet. Mitral valve regurgitation is trivial by color flow Doppler. There is no evidence of mitral valve vegetation. There is No evidence of mitral stenosis. Tricuspid Valve: There were vegetations present on the tricuspid valve and appeared to involve all three of the tricuspid leaflets. The leaflets ranged in size 1.5 X 1.0 cm. in diameter.There was severe tricuspid regurgitation. With echo guidance the angiovac probe was imaged and directed over the tricuspid valve inflow. The tricuspid vegetations were significantly reduced in size following angiovac debridement of the tricuspid valve. There was persistent severe tricuspid regurgitation following debridement. Aortic Valve: The aortic valve is tricuspid Aortic valve regurgitation was not visualized by color flow Doppler. There is no evidence of aortic valve stenosis. There is no evidence of a vegetation on the aortic valve. Pulmonic Valve: The pulmonic valve was normal in structure No  evidence of pumonic stenosis. Pulmonic valve regurgitation is not visualized by color flow Doppler.  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  Clifford Clark now has some neck pain where his central line is as well as new onset headache.  1.  MSSA bacteremia with tricuspid valve endocarditis and septic emboli: Clifford Clark seems to have done well having undergone angio VAC and fevers have defervesced.  I hope his headaches do not mean that Clifford Clark could have spread of his infection to the central nervous system.  We will observe  2.  Neck pain will check Doppler to look for thrombus associated with central line  3.  Back pain: MRI did not show evidence of discitis in the thoracic or lumbar area but could have infection there and just not be showing up on MRI yet.  4.  For IV drug use: We will need plan for addressing this root cause   LOS: 13 days   Acey Lav 05/11/2020, 2:12 PM

## 2020-05-11 NOTE — Progress Notes (Signed)
Refused to be turned to  sides, just a touch on his legs would complain of pain.

## 2020-05-12 ENCOUNTER — Encounter (HOSPITAL_COMMUNITY): Payer: Self-pay | Admitting: Thoracic Surgery (Cardiothoracic Vascular Surgery)

## 2020-05-12 ENCOUNTER — Inpatient Hospital Stay: Payer: Self-pay

## 2020-05-12 DIAGNOSIS — L02519 Cutaneous abscess of unspecified hand: Secondary | ICD-10-CM

## 2020-05-12 DIAGNOSIS — E440 Moderate protein-calorie malnutrition: Secondary | ICD-10-CM

## 2020-05-12 LAB — CBC
HCT: 25.6 % — ABNORMAL LOW (ref 39.0–52.0)
Hemoglobin: 8.4 g/dL — ABNORMAL LOW (ref 13.0–17.0)
MCH: 29.3 pg (ref 26.0–34.0)
MCHC: 32.8 g/dL (ref 30.0–36.0)
MCV: 89.2 fL (ref 80.0–100.0)
Platelets: 145 10*3/uL — ABNORMAL LOW (ref 150–400)
RBC: 2.87 MIL/uL — ABNORMAL LOW (ref 4.22–5.81)
RDW: 15.1 % (ref 11.5–15.5)
WBC: 13.3 10*3/uL — ABNORMAL HIGH (ref 4.0–10.5)
nRBC: 0 % (ref 0.0–0.2)

## 2020-05-12 LAB — MAGNESIUM: Magnesium: 1.8 mg/dL (ref 1.7–2.4)

## 2020-05-12 LAB — COMPREHENSIVE METABOLIC PANEL
ALT: 83 U/L — ABNORMAL HIGH (ref 0–44)
AST: 105 U/L — ABNORMAL HIGH (ref 15–41)
Albumin: 1.5 g/dL — ABNORMAL LOW (ref 3.5–5.0)
Alkaline Phosphatase: 124 U/L (ref 38–126)
Anion gap: 7 (ref 5–15)
BUN: 10 mg/dL (ref 6–20)
CO2: 25 mmol/L (ref 22–32)
Calcium: 7.4 mg/dL — ABNORMAL LOW (ref 8.9–10.3)
Chloride: 101 mmol/L (ref 98–111)
Creatinine, Ser: 0.74 mg/dL (ref 0.61–1.24)
GFR calc Af Amer: >60 mL/min (ref >60)
GFR calc non Af Amer: >60 mL/min (ref >60)
Glucose, Bld: 107 mg/dL — ABNORMAL HIGH (ref 70–99)
Potassium: 4.2 mmol/L (ref 3.5–5.1)
Sodium: 133 mmol/L — ABNORMAL LOW (ref 135–145)
Total Bilirubin: 0.6 mg/dL (ref 0.3–1.2)
Total Protein: 5.8 g/dL — ABNORMAL LOW (ref 6.5–8.1)

## 2020-05-12 MED ORDER — SODIUM CHLORIDE 0.9% FLUSH
10.0000 mL | Freq: Two times a day (BID) | INTRAVENOUS | Status: DC
  Administered 2020-05-12 – 2020-05-16 (×7): 10 mL
  Administered 2020-05-16: 20 mL
  Administered 2020-05-17 – 2020-05-18 (×3): 10 mL

## 2020-05-12 MED ORDER — SODIUM CHLORIDE 0.9% FLUSH
10.0000 mL | INTRAVENOUS | Status: DC | PRN
  Administered 2020-05-13 – 2020-05-16 (×2): 10 mL

## 2020-05-12 MED ORDER — METOPROLOL TARTRATE 12.5 MG HALF TABLET
12.5000 mg | ORAL_TABLET | Freq: Two times a day (BID) | ORAL | Status: DC
  Administered 2020-05-12 – 2020-05-13 (×2): 12.5 mg via ORAL
  Filled 2020-05-12 (×2): qty 1

## 2020-05-12 MED ORDER — VANCOMYCIN HCL 750 MG/150ML IV SOLN
750.0000 mg | Freq: Three times a day (TID) | INTRAVENOUS | Status: DC
  Administered 2020-05-12 – 2020-05-18 (×18): 750 mg via INTRAVENOUS
  Filled 2020-05-12 (×19): qty 150

## 2020-05-12 MED FILL — Electrolyte-R (PH 7.4) Solution: INTRAVENOUS | Qty: 2000 | Status: AC

## 2020-05-12 NOTE — Progress Notes (Signed)
Peripherally Inserted Central Catheter Placement  The IV Nurse has discussed with the patient and/or persons authorized to consent for the patient, the purpose of this procedure and the potential benefits and risks involved with this procedure.  The benefits include less needle sticks, lab draws from the catheter, and the patient may be discharged home with the catheter. Risks include, but not limited to, infection, bleeding, blood clot (thrombus formation), and puncture of an artery; nerve damage and irregular heartbeat and possibility to perform a PICC exchange if needed/ordered by physician.  Alternatives to this procedure were also discussed.  Bard Power PICC patient education guide, fact sheet on infection prevention and patient information card has been provided to patient /or left at bedside.    PICC Placement Documentation  PICC Single Lumen 05/12/20 PICC Right Basilic 36 cm 0 cm (Active)  Indication for Insertion or Continuance of Line Prolonged intravenous therapies 05/12/20 1400  Exposed Catheter (cm) 0 cm 05/12/20 1400  Site Assessment Clean;Dry;Intact 05/12/20 1400  Line Status Flushed;Saline locked;Blood return noted 05/12/20 1400  Dressing Type Transparent;Securing device 05/12/20 1400  Dressing Status Clean;Dry;Intact;Antimicrobial disc in place 05/12/20 1400  Dressing Change Due 05/19/20 05/12/20 1400       Clifford Clark [place] 05/12/2020, 2:15 PM

## 2020-05-12 NOTE — Plan of Care (Signed)

## 2020-05-12 NOTE — Progress Notes (Signed)
     [address]       [name] [id number]             [phone]       PT doing well Will remove stitches  Please call with questions.  Adian Jablonowski [place]

## 2020-05-12 NOTE — Progress Notes (Addendum)
PROGRESS NOTE    Clifford Clark  ZOX:[id number] DOB: 22-Mar-1991 DOA: 04/28/2020 PCP: Patient, [name]    Chief Complaint  Patient presents with  . 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 homeless, asthma who presented to the ER after being found on the ground sleeping by a friend he stated.  EMS was called and he was transported to the hospital. In the ER with multiple imaging studies including x-ray of his right hand and wrist, x-ray of left hand, CXR, CT chest/abdomen/pelvis, CT C-spine, and CT head.  There were no acute fractures in his bilateral upper extremities.  CT head also unremarkable.  Remainder of his imaging studies revealed what appeared to be septic pulmonary emboli as well as septic emboli involving both kidneys. Cultures were drawn and he was started on vancomycin, cefepime, and Flagyl in the ER - 4/4 blood cultures were growing MRSA. TTE also performed on 7/9 revealing TV vegetation. Became verbally abusive/threatening 05/01/20 and Psych was consulted and a sitter was requested.  7/12 Patient developed worsening swelling in his right wrist and left hand. There was concern for septic joint or bursitis. A CT was ordered bilaterally which did show bilateral abscesses. He is transferred from [place] to [place] for orthoconsult 7/13 Bilateral I&D by Dr Eulah Pont, complaining of pain - admits to taking Carfentanyl at home/off the street.  7/16 TEE shows large tricuspid vegetation -  transfer to [facility] for CT Sx to evaluate for angiovac 7/20 went to OR with CVTS: Debridement of right atrial mass - Debridement of tricuspid valve vegetation  Subjective:  Isolated fever 101.7 at 7 PM yesterday evening He complained of generalized pain , require frequent as needed pain medication Also complaints of anxiety requiring as  needed Ativan He is on 2 L of oxygen supplement, he denies short of breath, no cough noticed,   Assessment & Plan:   Principal Problem:   Endocarditis of tricuspid valve Active Problems:   IVDU (intravenous drug user)   Septic embolism (HCC)   Sepsis (HCC)   Thrombocytopenia (HCC)   Hypokalemia   Hyponatremia   MRSA bacteremia   Malnutrition of moderate degree   Threatening behavior   Hand abscess   Elevated BUN   Pressure injury of skin  MSSA /MRSA bacteremia with concurrent endocarditis in the setting of IV drug abuse - TTE performed on 04/29/20: TV vegetation (14mm) noted as well as possible veg on PV - TEE shows large mobile vegetation on tricuspid valve and small vegetation on pulmonic valve with no PFO -  - -S/p angiovac on 7/ 21, -Initial BC ID positive for MRSA, blood culture positive for MSSA, tissue culture from tricuspid valve vegetation [place] [facility] -He was on Ancef ,  changed to vanc on July 22 -appreciate ID  And cardiothoracic surgeryrecommendation - ID recommended remove central line and place PICC ; patient will need to remain in hospital for duration of treatment course; no indication for IVC if tries to leave AMA but PICC will have to be removed -pain control difficult-- will try to alternate PO and IV   Back pain:  MRI T/L spine negative for acute findings Prn analgesic, may need to repeat MRI if pain persist or get worse  Acute respiratory failure, septic emboli -x ray on 7/16 shows: Numerous  small, stable masslike opacities scattered throughout both Lungs -monitor closely -wean O2 as able  Hand abscess, bilateral, s/p I&D - Likely secondary to above - Bilateral abscesses on hands, s/p CT on 7/11 - Appreciate hand surgery Dr. Eulah Pont consult; I&D on 7/13 -encouraged him to keep elevated for swelling  Sepsis (HCC) secondary to above, present on admission - See MSSA bacteremia  Sinus tachycardia: TSH unremarkable Likely reactive, schedule  Lopressor with holding parameters  Acute metabolic encephalopathy, present on admission ,resolved - Considered due to IV drugs as well as sepsis/infection - Mentation has cleared and diet started   Anemia, likely multifactorial Acute (questionably blood loss versus dilutional) on chronic anemia of likely chronic disease -Iron deficiency notable on labs, cannot rule out malnutrition or anemia of chronic disease concurrently.  -Decrease lab draws to every 72 hours in hopes to avoid bloodletting the patient -Status post 1 unit PRBC (7/16)  Thrombocytopenia (HCC) - Likely due to acute illness/sepsis; cannot rule out other etiologies yet - Trend CBC with infection treatment; normalized  Hyponatremia - Replete and recheck as needed -Normalized  Elevated LFTs - Possibly due to current infection/sepsis vs risk for hepatitis from IV drug use - Hepatitis panel noted for hep C Ab positive, hep C quantitative at 30, Low/undetectable -  Liver ultrasound no acute abnormalities does show hepatomegaly on July 8 - LFTs normalized on July 19, however became elevated again on July 22, have discussed with infectious disease who do not think this is from antibiotic, this might be from her right-sided heart failure, will add on UDS as well, monitor LFTs  Hypokalemia, resolved -Repleted  Malnutrition of moderate degree Per RD: "Moderate Malnutritionrelated to social / environmental circumstancesas evidenced by energy intake < 75% for > or equal to 3 months, moderate fat depletion, moderate muscle depletion, percent weight loss."  Threatening behavior - on 7/11, patient endorsed multiple homicidal ideations to nurse and CNA in room; he threatened that he was going "to kill the attending" and all their family and kids; this was voiced several times despite nursing attempting to re-direct patient - psych eval requested - no evidence of imminent risk to self/others Per psychiatry evaluation:  "Patient is not suicidal, homicidal or psychotic and does not need inpatient psychiatric care.  Crisis and safety planning done it in length by NP prior to recommending discharge"   AKI, Resolved - BUN now downtrending appropriately; creatinine stable  Pressure Injury 05/06/20 Coccyx Medial Stage 2 -  Partial thickness loss of dermis presenting as a shallow open injury with a red, pink wound bed without slough. (Active)  05/06/20 0600  Location: Coccyx  Location Orientation: Medial  Staging: Stage 2 -  Partial thickness loss of dermis presenting as a shallow open injury with a red, pink wound bed without slough.  Wound Description (Comments):   Present on Admission:   unable to determine if POA due to documentation      DVT prophylaxis: enoxaparin (LOVENOX) injection 40 mg Start: 05/11/20 0800 SCD's Start: 05/03/20 1058   Code Status: Full Family Communication: Mother at bedside with permission on 7/21 Disposition:   Status is: Inpatient    Dispo: The patient is from: Home              Anticipated d/c is to: To be determined              Anticipated d/c date is: Not ready to discharge, getting IV antibiotics, need thoracic surgery and ID clearance  Consultants:   Thoracic surgery  Orthopedic surgery Dr. Eulah Pont  Infectious disease  Psychiatry on July 12  Cardiology for TEE  Procedures:  BILATERAL IRRIGATION AND DEBRIDEMENT HANDS on 7/13 by Dr. Eulah Pont TEE 7/16 Debridement of right atrial mass and  Debridement of tricuspid valve vegetation by thoracic surgery Dr. Cliffton Asters on July 20  Antimicrobials:   Ancef from July 15 to 7/22 Vanc 04/28/20 >> 7/15, vanc from 7/22 Cefepime 04/28/20 - 04/29/20 Flagyl 04/28/20 - 04/29/20    Objective: Vitals:   05/12/20 0430 05/12/20 0444 05/12/20 0738 05/12/20 1200  BP:   (!) 141/90 (!) 127/89  Pulse:   (!) 128 (!) 123  Resp: (!) 38 (!) 21 18 (!) 24  Temp:   98.6 F (37 C) 98.7 F (37.1 C)  TempSrc:   Oral  Oral  SpO2:   91% 94%  Weight:      Height:        Intake/Output Summary (Last 24 hours) at 05/12/2020 1538 Last data filed at 05/12/2020 0428 Gross per 24 hour  Intake 403 ml  Output 900 ml  Net -497 ml   Filed Weights   04/28/20 2130  Weight: 55 kg    Examination:  General exam: in pain Respiratory system:  Respiratory effort normal.  Cardiovascular system: Sinus tachycardia Gastrointestinal system: Abdomen is nondistended, soft and nontender. No organomegaly or masses felt. Normal bowel sounds heard. Central nervous system: Alert and oriented. No focal neurological deficits. Extremities: Bilateral hand dressing intact, left hand edema has resolved, right hand edema has improved by report Skin: No rashes, lesions or ulcers Psychiatry: Judgement and insight appear normal. Mood & affect appropriate.     Data Reviewed: I have personally reviewed following labs and imaging studies  CBC: Recent Labs  Lab 05/06/20 0220 05/06/20 0220 05/06/20 1107 05/09/20 0307 05/10/20 0802 05/10/20 0926 05/12/20 0500  WBC 13.8*  --   --  14.7*  --   --  13.3*  HGB 6.8*   < > 8.8* 7.2* 6.5* 8.2* 8.4*  HCT 21.3*   < > 26.0* 22.8* 19.0* 24.0* 25.6*  MCV 86.2  --   --  88.4  --   --  89.2  PLT 214  --   --  178  --   --  145*   < > = values in this interval not displayed.    Basic Metabolic Panel: Recent Labs  Lab 05/06/20 0220 05/06/20 0220 05/06/20 1107 05/09/20 0307 05/10/20 0802 05/10/20 0926 05/12/20 0500  NA 135   < > 138 136 138 138 133*  K 4.7   < > 4.6 4.3 4.2 4.3 4.2  CL 106  --  102 102  --   --  101  CO2 25  --   --  25  --   --  25  GLUCOSE 94  --  90 116*  --   --  107*  BUN 17  --  15 10  --   --  10  CREATININE 0.65  --  0.60* 0.73  --   --  0.74  CALCIUM 7.6*  --   --  7.7*  --   --  7.4*  MG  --   --   --   --   --   --  1.8   < > = values in this interval not displayed.    GFR: Estimated Creatinine Clearance: 106 mL/min (by C-G formula based on SCr  of 0.74 mg/dL).  Liver  Function Tests: Recent Labs  Lab 05/06/20 0220 05/09/20 0307 05/12/20 0500  AST 35 25 105*  ALT 33 16 83*  ALKPHOS 67 69 124  BILITOT 0.4 0.5 0.6  PROT 5.3* 5.6* 5.8*  ALBUMIN 1.6* 1.4* 1.5*    CBG: No results for input(s): GLUCAP in the last 168 hours.   Recent Results (from the past 240 hour(s))  Fungus Culture With Stain     Status: None (Preliminary result)   Collection Time: 05/03/20  9:35 AM   Specimen: Soft Tissue, Other  Result Value Ref Range Status   Fungus Stain Final report  Final    Comment: (NOTE) Performed At: [facility] [address] [place], [place] [id number] Jolene Schimke MD [id number]    Fungus (Mycology) Culture PENDING  Incomplete   Fungal Source TISSUE  Final    Comment: RT WRIST Performed at [facility], [address] [place], [place] [address]   Aerobic/Anaerobic Culture (surgical/deep wound)     Status: None   Collection Time: 05/03/20  9:35 AM   Specimen: Soft Tissue, Other  Result Value Ref Range Status   Specimen Description   Final    TISSUE RT WRIST Performed at [facility], [address], [place], [place] [address]    Special Requests   Final    PODIATRY [place] Performed at [facility], [address], [place], [place] [address]    Gram Stain   Final    RARE WBC PRESENT,BOTH PMN AND MONONUCLEAR RARE GRAM POSITIVE COCCI IN PAIRS IN CLUSTERS    Culture   Final    RARE STAPHYLOCOCCUS AUREUS NO ANAEROBES ISOLATED Performed at [facility], [address], [place], [place] [address]    Report Status 05/08/2020 FINAL  Final   Organism ID, Bacteria STAPHYLOCOCCUS AUREUS  Final      Susceptibility   Staphylococcus aureus - MIC*    CIPROFLOXACIN <=0.5 SENSITIVE Sensitive     ERYTHROMYCIN >=8 RESISTANT Resistant     GENTAMICIN <=0.5 SENSITIVE Sensitive     OXACILLIN 0.5 SENSITIVE Sensitive     TETRACYCLINE <=1 SENSITIVE  Sensitive     VANCOMYCIN <=0.5 SENSITIVE Sensitive     TRIMETH/SULFA <=10 SENSITIVE Sensitive     CLINDAMYCIN <=0.25 SENSITIVE Sensitive     RIFAMPIN <=0.5 SENSITIVE Sensitive     Inducible Clindamycin NEGATIVE Sensitive     * RARE STAPHYLOCOCCUS AUREUS  Fungus Culture Result     Status: None   Collection Time: 05/03/20  9:35 AM  Result Value Ref Range Status   Result 1 Comment  Final    Comment: (NOTE) KOH/Calcofluor preparation:  no fungus observed. Performed At: [facility] [address] [place], [place] [id number] Jolene Schimke MD [id number]   Fungus Culture With Stain     Status: None (Preliminary result)   Collection Time: 05/03/20  9:38 AM   Specimen: Soft Tissue, Other  Result Value Ref Range Status   Fungus Stain Final report  Final    Comment: (NOTE) Performed At: [facility] [address] [place], [place] [id number] Jolene Schimke MD [id number]:[id number]    Fungus (Mycology) Culture PENDING  Incomplete   Fungal Source TISSUE  Final    Comment: LT HAND Performed at [facility], [address], [place], [place] [address]   Aerobic/Anaerobic Culture (surgical/deep wound)     Status: None   Collection Time: 05/03/20  9:38 AM   Specimen: Soft Tissue, Other  Result  Value Ref Range Status   Specimen Description   Final    TISSUE LT HAND Performed at [facility], [address] [place], [place] [address]    Special Requests   Final    PATIENT ON FOLLOWING ANCEF, [facility] Performed at [facility], [address], [place], [place] [address]    Gram Stain   Final    NO WBC SEEN RARE GRAM POSITIVE COCCI IN PAIRS IN CLUSTERS    Culture   Final    FEW STAPHYLOCOCCUS AUREUS NO ANAEROBES ISOLATED Performed at [facility], [address] [place], [place] [address]    Report Status 05/08/2020 FINAL  Final   Organism ID, Bacteria STAPHYLOCOCCUS AUREUS  Final      Susceptibility    Staphylococcus aureus - MIC*    CIPROFLOXACIN <=0.5 SENSITIVE Sensitive     ERYTHROMYCIN >=8 RESISTANT Resistant     GENTAMICIN <=0.5 SENSITIVE Sensitive     OXACILLIN 0.5 SENSITIVE Sensitive     TETRACYCLINE <=1 SENSITIVE Sensitive     VANCOMYCIN 1 SENSITIVE Sensitive     TRIMETH/SULFA <=10 SENSITIVE Sensitive     CLINDAMYCIN <=0.25 SENSITIVE Sensitive     RIFAMPIN <=0.5 SENSITIVE Sensitive     Inducible Clindamycin NEGATIVE Sensitive     * FEW STAPHYLOCOCCUS AUREUS  Fungus Culture Result     Status: None   Collection Time: 05/03/20  9:38 AM  Result Value Ref Range Status   Result 1 Comment  Final    Comment: (NOTE) KOH/Calcofluor preparation:  no fungus observed. Performed At: [facility] [address] [place], [place] [id number] Jolene Schimke MD OZ:[id number]   Culture, blood (Routine X 2) w Reflex to ID Panel     Status: None   Collection Time: 05/04/20  5:19 PM   Specimen: BLOOD  Result Value Ref Range Status   Specimen Description   Final    BLOOD RIGHT ANTECUBITAL Performed at [facility], [address] [place], [place] [address]    Special Requests   Final    BOTTLES DRAWN AEROBIC AND ANAEROBIC Blood Culture adequate volume Performed at [facility], [address], [place], [place] [address]    Culture   Final    NO GROWTH 5 DAYS Performed at [facility], [address] [place], [place] [address]    Report Status 05/09/2020 FINAL  Final  Culture, blood (Routine X 2) w Reflex to ID Panel     Status: None   Collection Time: 05/04/20  5:19 PM   Specimen: BLOOD  Result Value Ref Range Status   Specimen Description   Final    BLOOD LEFT ANTECUBITAL Performed at [facility], [address] [place], [place] [address]    Special Requests   Final    BOTTLES DRAWN AEROBIC ONLY Blood Culture adequate volume Performed at [facility], [address], [place], [place]  [address]    Culture   Final    NO GROWTH 5 DAYS Performed at [facility], [address], [place], [place] [address]    Report Status 05/09/2020 FINAL  Final  Fungus Culture With Stain     Status: None (Preliminary result)   Collection Time: 05/10/20  9:10 AM   Specimen: Soft Tissue, Other  Result Value Ref Range Status   Fungus Stain Final report  Final    Comment: (NOTE) Performed At: [facility] [address]  [place], [place] [id number] Jolene Schimke MD [id number]:[id number]    Fungus (Mycology) Culture PENDING  Incomplete   Fungal Source TRICUSPID VALVE VEGETATION  Final    Comment: Performed at [facility], [address] [place], [place] [address]  Aerobic/Anaerobic Culture (surgical/deep wound)     Status: None (Preliminary result)   Collection Time: 05/10/20  9:10 AM   Specimen: Soft Tissue, Other  Result Value Ref Range Status   Specimen Description TISSUE  Final   Special Requests TRICUSPID VALVE VEGETATION  Final   Gram Stain   Final    RARE WBC PRESENT, PREDOMINANTLY PMN ABUNDANT GRAM POSITIVE COCCI IN PAIRS IN CLUSTERS Performed at [facility], [address] [place], [place] [address]    Culture   Final    ABUNDANT METHICILLIN RESISTANT STAPHYLOCOCCUS AUREUS NO ANAEROBES ISOLATED; CULTURE IN PROGRESS FOR 5 DAYS    Report Status PENDING  Incomplete   Organism ID, Bacteria METHICILLIN RESISTANT STAPHYLOCOCCUS AUREUS  Final      Susceptibility   Methicillin resistant staphylococcus aureus - MIC*    CIPROFLOXACIN <=0.5 SENSITIVE Sensitive     ERYTHROMYCIN >=8 RESISTANT Resistant     GENTAMICIN <=0.5 SENSITIVE Sensitive     OXACILLIN >=4 RESISTANT Resistant     TETRACYCLINE <=1 SENSITIVE Sensitive     VANCOMYCIN 1 SENSITIVE Sensitive     TRIMETH/SULFA <=10 SENSITIVE Sensitive     CLINDAMYCIN <=0.25 SENSITIVE Sensitive     RIFAMPIN <=0.5 SENSITIVE Sensitive     Inducible Clindamycin NEGATIVE Sensitive     * ABUNDANT METHICILLIN RESISTANT  STAPHYLOCOCCUS AUREUS  Acid Fast Smear (AFB)     Status: None   Collection Time: 05/10/20  9:10 AM   Specimen: Soft Tissue, Other  Result Value Ref Range Status   AFB Specimen Processing Concentration  Final   Acid Fast Smear Negative  Final    Comment: (NOTE) Performed At: [facility] [address] [place], [place] [id number] Jolene Schimke MD [id number]    Source (AFB) TRICUSPID VALVE VEGETATION  Final    Comment: Performed at [facility], [address], [place], [place] [address]  Fungus Culture Result     Status: None   Collection Time: 05/10/20  9:10 AM  Result Value Ref Range Status   Result 1 Comment  Final    Comment: (NOTE) KOH/Calcofluor preparation:  no fungus observed. Performed At: [facility] [address] [place], [place] [id number] Jolene Schimke MD VZ:[id number]          Radiology Studies: VAS [place] UPPER EXTREMITY VENOUS DUPLEX  Result Date: 05/11/2020 UPPER VENOUS STUDY  Indications: Pain Limitations: Line and bandages. Comparison Study: no prior Performing Technologist: Blanch Media RVS  Examination Guidelines: A complete evaluation includes B-mode imaging, spectral Doppler, color Doppler, and power Doppler as needed of all accessible portions of each vessel. Bilateral testing is considered an integral part of a complete examination. Limited examinations for reoccurring indications may be performed as noted.  Left Findings: +----------+------------+---------+-----------+----------+--------------+ LEFT      CompressiblePhasicitySpontaneousProperties   Summary     +----------+------------+---------+-----------+----------+--------------+ IJV                                                 Not visualized +----------+------------+---------+-----------+----------+--------------+ Subclavian    Full       Yes  Yes                             +----------+------------+---------+-----------+----------+--------------+  Axillary      Full       Yes       Yes                             +----------+------------+---------+-----------+----------+--------------+ Brachial      Full       Yes       Yes                             +----------+------------+---------+-----------+----------+--------------+ Radial        Full                                                 +----------+------------+---------+-----------+----------+--------------+ Ulnar         Full                                                 +----------+------------+---------+-----------+----------+--------------+ Cephalic      Full                                                 +----------+------------+---------+-----------+----------+--------------+ Basilic       Full                                                 +----------+------------+---------+-----------+----------+--------------+  Summary:  Left: No evidence of deep vein thrombosis in the upper extremity. No evidence of superficial vein thrombosis in the upper extremity.  *See table(s) above for measurements and observations.  Diagnosing physician: Fabienne Bruns MD Electronically signed by Fabienne Bruns MD on 05/11/2020 at 6:10:59 PM.    Final    [place] EKG SITE RITE  Result Date: 05/12/2020 If Site Rite image not attached, placement could not be confirmed due to current cardiac rhythm.       Scheduled Meds: . Chlorhexidine Gluconate Cloth  6 each Topical Q0600  . docusate sodium  100 mg Oral BID  . enoxaparin (LOVENOX) injection  40 mg Subcutaneous Q24H  . mouth rinse  15 mL Mouth Rinse BID  . metoprolol tartrate  12.5 mg Oral BID  . multivitamin with minerals  1 tablet Oral Daily  . Ensure Max Protein  11 oz Oral Daily  . sodium chloride flush  10-40 mL Intracatheter Q12H  . sodium chloride flush  3 mL Intravenous Q12H   Continuous Infusions: . vancomycin       LOS: 14 days     Time spent: I have personally reviewed and interpreted on   05/12/2020 daily labs, tele strips, imagings as discussed above under date review session and assessment and plans.  I reviewed all nursing notes, pharmacy notes, consultant notes,  vitals, pertinent old records  I have discussed plan of care as described above with RN , patient and family on 05/12/2020  Voice Recognition /Dragon dictation system was used to create this note, attempts have been made to correct errors. Please contact the author with questions and/or clarifications.   Albertine GratesFang Alayne Estrella, MD PhD FACP [facility]  Available via Epic secure chat 7am-7pm for nonurgent issues Please page for urgent issues To page the attending provider between 7A-7P or the covering provider during after hours 7P-7A, please log into the web site [email or web address] and access using universal [place] password for that web site. If you do not have the password, please call the hospital operator.    05/12/2020, 3:38 PM

## 2020-05-12 NOTE — Progress Notes (Addendum)
Subjective:  HA gone and neck pain better   Antibiotics:  Anti-infectives (From admission, onward)   Start     Dose/Rate Route Frequency Ordered Stop   05/05/20 1600  ceFAZolin (ANCEF) IVPB 2g/100 mL premix     Discontinue     2 g 200 mL/hr over 30 Minutes Intravenous Every 8 hours 05/05/20 1452     05/04/20 1200  vancomycin (VANCOCIN) IVPB 1000 mg/200 mL premix  Status:  Discontinued        1,000 mg 200 mL/hr over 60 Minutes Intravenous Every 12 hours 05/04/20 1045 05/04/20 1055   05/04/20 1200  vancomycin (VANCOREADY) IVPB 750 mg/150 mL  Status:  Discontinued        750 mg 150 mL/hr over 60 Minutes Intravenous Every 8 hours 05/04/20 1055 05/05/20 1452   05/03/20 0945  vancomycin (VANCOCIN) powder  Status:  Discontinued          As needed 05/03/20 0945 05/03/20 1057   05/03/20 0920  ceFAZolin (ANCEF) 2-4 GM/100ML-% IVPB       Note to Pharmacy: Viviano Simas   : cabinet override      05/03/20 0920 05/03/20 2129   05/02/20 1500  vancomycin (VANCOREADY) IVPB 750 mg/150 mL  Status:  Discontinued        750 mg 150 mL/hr over 60 Minutes Intravenous Every 12 hours 05/02/20 1425 05/04/20 1045   05/01/20 1330  vancomycin variable dose per unstable renal function (pharmacist dosing)  Status:  Discontinued         Does not apply See admin instructions 05/01/20 1330 05/03/20 0810   04/29/20 1000  vancomycin (VANCOREADY) IVPB 750 mg/150 mL  Status:  Discontinued        750 mg 150 mL/hr over 60 Minutes Intravenous Every 8 hours 04/29/20 0834 05/01/20 1329   04/29/20 0400  metroNIDAZOLE (FLAGYL) IVPB 500 mg  Status:  Discontinued        500 mg 100 mL/hr over 60 Minutes Intravenous Every 8 hours 04/28/20 2122 04/29/20 0819   04/29/20 0400  ceFEPIme (MAXIPIME) 2 g in sodium chloride 0.9 % 100 mL IVPB  Status:  Discontinued        2 g 200 mL/hr over 30 Minutes Intravenous Every 8 hours 04/28/20 2138 04/29/20 0819   04/29/20 0100  vancomycin (VANCOCIN) IVPB 1000 mg/200 mL premix   Status:  Discontinued        1,000 mg 200 mL/hr over 60 Minutes Intravenous Every 8 hours 04/28/20 2146 04/29/20 0833   04/28/20 1715  ceFEPIme (MAXIPIME) 2 g in sodium chloride 0.9 % 100 mL IVPB        2 g 200 mL/hr over 30 Minutes Intravenous  Once 04/28/20 1708 04/28/20 1903   04/28/20 1715  metroNIDAZOLE (FLAGYL) IVPB 500 mg        500 mg 100 mL/hr over 60 Minutes Intravenous  Once 04/28/20 1708 04/28/20 2111   04/28/20 1715  vancomycin (VANCOCIN) IVPB 1000 mg/200 mL premix        1,000 mg 200 mL/hr over 60 Minutes Intravenous  Once 04/28/20 1708 04/28/20 1947      Medications: Scheduled Meds: . Chlorhexidine Gluconate Cloth  6 each Topical Q0600  . docusate sodium  100 mg Oral BID  . enoxaparin (LOVENOX) injection  40 mg Subcutaneous Q24H  . mouth rinse  15 mL Mouth Rinse BID  . metoprolol tartrate  12.5 mg Oral BID  . multivitamin with minerals  1 tablet Oral  Daily  . Ensure Max Protein  11 oz Oral Daily  . sodium chloride flush  3 mL Intravenous Q12H   Continuous Infusions: .  ceFAZolin (ANCEF) IV 2 g (05/12/20 0510)   PRN Meds:.acetaminophen **OR** acetaminophen, ipratropium-albuterol, LORazepam, metoprolol tartrate, morphine injection, ondansetron **OR** ondansetron (ZOFRAN) IV, oxyCODONE    Objective: Weight change:   Intake/Output Summary (Last 24 hours) at 05/12/2020 1230 Last data filed at 05/12/2020 0428 Gross per 24 hour  Intake 503 ml  Output 900 ml  Net -397 ml   Blood pressure (!) 127/89, pulse (!) 123, temperature 98.7 F (37.1 C), temperature source Oral, resp. rate (!) 24, height 5\' 7"  (1.702 m), weight 55 kg, SpO2 94 %. Temp:  [98.3 F (36.8 C)-101.7 F (38.7 C)] 98.7 F (37.1 C) (07/22 1200) Pulse Rate:  [123-128] 123 (07/22 1200) Resp:  [13-56] 24 (07/22 1200) BP: (127-149)/(86-99) 127/89 (07/22 1200) SpO2:  [91 %-98 %] 94 % (07/22 1200)  Physical Exam: General: Alert and awake, oriented x3, chronically ill-appearing. HEENT: anicteric  sclera, EOMI, central line his neck CVS tachycardic, could not appreciate murmurs Chest: ,  fairly clear Abdomen: soft non-distended,  Extremities: no edema or deformity noted bilaterally Skin: no rashes Neuro: nonfocal  CBC:    BMET Recent Labs    05/10/20 0926 05/12/20 0500  NA 138 133*  K 4.3 4.2  CL  --  101  CO2  --  25  GLUCOSE  --  107*  BUN  --  10  CREATININE  --  0.74  CALCIUM  --  7.4*     Liver Panel  Recent Labs    05/12/20 0500  PROT 5.8*  ALBUMIN 1.5*  AST 105*  ALT 83*  ALKPHOS 124  BILITOT 0.6       Sedimentation Rate No results for input(s): ESRSEDRATE in the last 72 hours. C-Reactive Protein No results for input(s): CRP in the last 72 hours.  Micro Results: Recent Results (from the past 720 hour(s))  Culture, blood (routine x 2)     Status: Abnormal   Collection Time: 04/28/20  3:45 PM   Specimen: BLOOD  Result Value Ref Range Status   Specimen Description   Final    BLOOD LEFT ANTECUBITAL Performed at [facility], [address], [place], [place]    Special Requests   Final    BOTTLES DRAWN AEROBIC AND ANAEROBIC Blood Culture adequate volume Performed at [facility], [address], [place], [place]    Culture  Setup Time   Final    GRAM POSITIVE COCCI IN BOTH AEROBIC AND ANAEROBIC BOTTLES CRITICAL RESULT CALLED TO, READ BACK BY AND VERIFIED WITH: PHARMD M. [id number] Derrick Ravel 0800 FCP    Culture (A)  Final    STAPHYLOCOCCUS AUREUS METHICILLIN RESISTANT ORGANISM WAS NOT RECOVERED IN CULTURE. REPEATED FOR CONFIRMATION Performed at [facility], [address], [place], [place]    Report Status 05/03/2020 FINAL  Final   Organism ID, Bacteria STAPHYLOCOCCUS AUREUS  Final      Susceptibility   Staphylococcus aureus - MIC*    CIPROFLOXACIN <=0.5 SENSITIVE Sensitive     ERYTHROMYCIN >=8 RESISTANT Resistant     GENTAMICIN <=0.5 SENSITIVE Sensitive      OXACILLIN 0.5 SENSITIVE Sensitive     TETRACYCLINE <=1 SENSITIVE Sensitive     VANCOMYCIN <=0.5 SENSITIVE Sensitive     TRIMETH/SULFA <=10 SENSITIVE Sensitive     CLINDAMYCIN <=0.25 SENSITIVE Sensitive  RIFAMPIN <=0.5 SENSITIVE Sensitive     Inducible Clindamycin NEGATIVE Sensitive     * STAPHYLOCOCCUS AUREUS  Blood Culture ID Panel (Reflexed)     Status: Abnormal   Collection Time: 04/28/20  3:45 PM  Result Value Ref Range Status   Enterococcus species NOT DETECTED NOT DETECTED Final   Listeria monocytogenes NOT DETECTED NOT DETECTED Final   Staphylococcus species DETECTED (A) NOT DETECTED Final    Comment: CRITICAL RESULT CALLED TO, READ BACK BY AND VERIFIED WITH: PHARMD Lynder Parents [phone] FCP    Staphylococcus aureus (BCID) DETECTED (A) NOT DETECTED Final    Comment: Methicillin (oxacillin)-resistant Staphylococcus aureus (MRSA). MRSA is predictably resistant to beta-lactam antibiotics (except ceftaroline). Preferred therapy is vancomycin unless clinically contraindicated. Patient requires contact precautions if  hospitalized. CRITICAL RESULT CALLED TO, READ BACK BY AND VERIFIED WITH: PHARMD M. Derrick Ravel [phone] FCP    Methicillin resistance DETECTED (A) NOT DETECTED Final    Comment: CRITICAL RESULT CALLED TO, READ BACK BY AND VERIFIED WITH: PHARMD M. RENZ [phone] FCP    Streptococcus species NOT DETECTED NOT DETECTED Final   Streptococcus agalactiae NOT DETECTED NOT DETECTED Final   Streptococcus pneumoniae NOT DETECTED NOT DETECTED Final   Streptococcus pyogenes NOT DETECTED NOT DETECTED Final   Acinetobacter baumannii NOT DETECTED NOT DETECTED Final   Enterobacteriaceae species NOT DETECTED NOT DETECTED Final   Enterobacter cloacae complex NOT DETECTED NOT DETECTED Final   Escherichia coli NOT DETECTED NOT DETECTED Final   Klebsiella oxytoca NOT DETECTED NOT DETECTED Final   Klebsiella pneumoniae NOT DETECTED NOT DETECTED Final   Proteus species NOT DETECTED NOT  DETECTED Final   Serratia marcescens NOT DETECTED NOT DETECTED Final   Haemophilus influenzae NOT DETECTED NOT DETECTED Final   Neisseria meningitidis NOT DETECTED NOT DETECTED Final   Pseudomonas aeruginosa NOT DETECTED NOT DETECTED Final   Candida albicans NOT DETECTED NOT DETECTED Final   Candida glabrata NOT DETECTED NOT DETECTED Final   Candida krusei NOT DETECTED NOT DETECTED Final   Candida parapsilosis NOT DETECTED NOT DETECTED Final   Candida tropicalis NOT DETECTED NOT DETECTED Final    Comment: Performed at [facility], [address], [place], [place] [address]  Culture, blood (routine x 2)     Status: Abnormal   Collection Time: 04/28/20  4:13 PM   Specimen: BLOOD  Result Value Ref Range Status   Specimen Description   Final    BLOOD RIGHT ANTECUBITAL Performed at [facility], [address] [place], [place] [address]    Special Requests   Final    BOTTLES DRAWN AEROBIC AND ANAEROBIC Blood Culture adequate volume Performed at [facility], [address] [place], [place] [address]    Culture  Setup Time   Final    GRAM POSITIVE COCCI IN BOTH AEROBIC AND ANAEROBIC BOTTLES CRITICAL VALUE NOTED.  VALUE IS CONSISTENT WITH PREVIOUSLY REPORTED AND CALLED VALUE.    Culture (A)  Final    STAPHYLOCOCCUS AUREUS METHICILLIN RESISTANT ORGANISM WAS NOT RECOVERED IN CULTURE. REPEATED FOR CONFIRMATION Performed at [facility], [address] [place], [place] [address]    Report Status 05/03/2020 FINAL  Final   Organism ID, Bacteria STAPHYLOCOCCUS AUREUS  Final      Susceptibility   Staphylococcus aureus - MIC*    CIPROFLOXACIN <=0.5 SENSITIVE Sensitive     ERYTHROMYCIN >=8 RESISTANT Resistant     GENTAMICIN <=0.5 SENSITIVE Sensitive     OXACILLIN 0.5  SENSITIVE Sensitive     TETRACYCLINE <=1 SENSITIVE Sensitive     VANCOMYCIN <=0.5 SENSITIVE Sensitive     TRIMETH/SULFA <=10 SENSITIVE Sensitive     CLINDAMYCIN <=0.25  SENSITIVE Sensitive     RIFAMPIN <=0.5 SENSITIVE Sensitive     Inducible Clindamycin NEGATIVE Sensitive     * STAPHYLOCOCCUS AUREUS  Urine culture     Status: Abnormal   Collection Time: 04/28/20  7:00 PM   Specimen: In/Out Cath Urine  Result Value Ref Range Status   Specimen Description   Final    IN/OUT CATH URINE Performed at [facility], [address] [place], [place] [address]    Special Requests   Final    NONE Performed at [facility], [address] [place], [place] [address]    Culture >=100,000 COLONIES/mL STAPHYLOCOCCUS AUREUS (A)  Final   Report Status 05/01/2020 FINAL  Final   Organism ID, Bacteria STAPHYLOCOCCUS AUREUS (A)  Final      Susceptibility   Staphylococcus aureus - MIC*    CIPROFLOXACIN <=0.5 SENSITIVE Sensitive     GENTAMICIN <=0.5 SENSITIVE Sensitive     NITROFURANTOIN <=16 SENSITIVE Sensitive     OXACILLIN 0.5 SENSITIVE Sensitive     TETRACYCLINE <=1 SENSITIVE Sensitive     VANCOMYCIN <=0.5 SENSITIVE Sensitive     TRIMETH/SULFA <=10 SENSITIVE Sensitive     CLINDAMYCIN <=0.25 SENSITIVE Sensitive     RIFAMPIN <=0.5 SENSITIVE Sensitive     Inducible Clindamycin NEGATIVE Sensitive     * >=100,000 COLONIES/mL STAPHYLOCOCCUS AUREUS  SARS Coronavirus 2 by RT PCR (hospital order, performed in [facility] hospital lab) Nasopharyngeal Nasopharyngeal Swab     Status: None   Collection Time: 04/28/20  7:03 PM   Specimen: Nasopharyngeal Swab  Result Value Ref Range Status   SARS Coronavirus 2 NEGATIVE NEGATIVE Final    Comment: (NOTE) SARS-CoV-2 target nucleic acids are NOT DETECTED.  The SARS-CoV-2 RNA is generally detectable in upper and lower respiratory specimens during the acute phase of infection. The lowest concentration of SARS-CoV-2 viral copies this assay can detect is 250 copies / mL. A negative result does not preclude SARS-CoV-2 infection and should not be used as the sole basis for treatment or  other patient management decisions.  A negative result may occur with improper specimen collection / handling, submission of specimen other than nasopharyngeal swab, presence of viral mutation(s) within the areas targeted by this assay, and inadequate number of viral copies (<250 copies / mL). A negative result must be combined with clinical observations, patient history, and epidemiological information.  Fact Sheet for Patients:   BoilerBrush.com.cy  Fact Sheet for Healthcare Providers: https://pope.com/  This test is not yet approved or  cleared by the [place] FDA and has been authorized for detection and/or diagnosis of SARS-CoV-2 by FDA under an Emergency Use Authorization (EUA).  This EUA will remain in effect (meaning this test can be used) for the duration of the COVID-19 declaration under Section 564(b)(1) of the Act, 21 U.S.C. section 360bbb-3(b)(1), unless the authorization is terminated or revoked sooner.  Performed at [facility], [address], [place], [place] [address]   MRSA PCR Screening     Status: Abnormal   Collection Time: 04/28/20  7:45 PM   Specimen: Nasal Mucosa; Nasopharyngeal  Result Value Ref Range Status   MRSA by PCR POSITIVE (A) NEGATIVE Final    Comment:        The GeneXpert MRSA Assay (FDA  approved for NASAL specimens only), is one component of a comprehensive MRSA colonization surveillance program. It is not intended to diagnose MRSA infection nor to guide or monitor treatment for MRSA infections. RESULT CALLED TO, READ BACK BY AND VERIFIED WITH: S SMITH AT 2314 ON 04/28/2020 BY MOSLEY,J  Performed at [facility], [address] [place], [place] [address]   Culture, blood (Routine X 2) w Reflex to ID Panel     Status: None   Collection Time: 04/30/20  8:15 AM   Specimen: BLOOD  Result Value Ref Range Status   Specimen Description   Final     BLOOD LEFT ANTECUBITAL Performed at [facility], [address], [place], [place] [address]    Special Requests   Final    BOTTLES DRAWN AEROBIC AND ANAEROBIC Blood Culture adequate volume Performed at [facility], [address] [place], [place] [address]    Culture   Final    NO GROWTH 5 DAYS Performed at [facility], [address], [place], [place] [address]    Report Status 05/05/2020 FINAL  Final  Culture, blood (Routine X 2) w Reflex to ID Panel     Status: None   Collection Time: 04/30/20  8:15 AM   Specimen: BLOOD LEFT FOREARM  Result Value Ref Range Status   Specimen Description   Final    BLOOD LEFT FOREARM Performed at [facility], [address], [place], [place] [address]    Special Requests   Final    BOTTLES DRAWN AEROBIC ONLY Blood Culture adequate volume Performed at [facility], [address] [place], [place] [address]    Culture   Final    NO GROWTH 5 DAYS Performed at [facility], [address], [place], [place] [address]    Report Status 05/05/2020 FINAL  Final  Fungus Culture With Stain     Status: None (Preliminary result)   Collection Time: 05/03/20  9:35 AM   Specimen: Soft Tissue, Other  Result Value Ref Range Status   Fungus Stain Final report  Final    Comment: (NOTE) Performed At: [facility] [address] [place], [place] [id number] Jolene Schimke MD WY:[id number]    Fungus (Mycology) Culture PENDING  Incomplete   Fungal Source TISSUE  Final    Comment: RT WRIST Performed at [facility], [address] [place], [place] [address]   Aerobic/Anaerobic Culture (surgical/deep wound)     Status: None   Collection Time: 05/03/20  9:35 AM   Specimen: Soft Tissue, Other  Result Value Ref Range Status   Specimen Description   Final    TISSUE RT WRIST Performed at [facility], [address] [place] [address] [place], [place]  [address]    Special Requests   Final    PODIATRY [place] Performed at [facility], [address], [place], [place] [address]    Gram Stain   Final    RARE WBC PRESENT,BOTH PMN AND MONONUCLEAR RARE GRAM POSITIVE COCCI IN PAIRS IN CLUSTERS    Culture   Final    RARE STAPHYLOCOCCUS AUREUS NO ANAEROBES ISOLATED Performed at [facility], [address], [place], [place] [address]    Report Status 05/08/2020 FINAL  Final   Organism ID, Bacteria STAPHYLOCOCCUS AUREUS  Final      Susceptibility   Staphylococcus aureus - MIC*    CIPROFLOXACIN <=0.5 SENSITIVE Sensitive  ERYTHROMYCIN >=8 RESISTANT Resistant     GENTAMICIN <=0.5 SENSITIVE Sensitive     OXACILLIN 0.5 SENSITIVE Sensitive     TETRACYCLINE <=1 SENSITIVE Sensitive     VANCOMYCIN <=0.5 SENSITIVE Sensitive     TRIMETH/SULFA <=10 SENSITIVE Sensitive     CLINDAMYCIN <=0.25 SENSITIVE Sensitive     RIFAMPIN <=0.5 SENSITIVE Sensitive     Inducible Clindamycin NEGATIVE Sensitive     * RARE STAPHYLOCOCCUS AUREUS  Fungus Culture Result     Status: None   Collection Time: 05/03/20  9:35 AM  Result Value Ref Range Status   Result 1 Comment  Final    Comment: (NOTE) KOH/Calcofluor preparation:  no fungus observed. Performed At: [facility] [address] [place], [place] [id number] Jolene SchimkeNagendra Sanjai MD WU:[id number]:[phone]   Fungus Culture With Stain     Status: None (Preliminary result)   Collection Time: 05/03/20  9:38 AM   Specimen: Soft Tissue, Other  Result Value Ref Range Status   Fungus Stain Final report  Final    Comment: (NOTE) Performed At: [facility] [address] [place], [place] [id number] Jolene SchimkeNagendra Sanjai MD VH:[id number]:[phone]    Fungus (Mycology) Culture PENDING  Incomplete   Fungal Source TISSUE  Final    Comment: LT HAND Performed at [facility], [address] [place], [place] [address]   Aerobic/Anaerobic Culture (surgical/deep wound)     Status:  None   Collection Time: 05/03/20  9:38 AM   Specimen: Soft Tissue, Other  Result Value Ref Range Status   Specimen Description   Final    TISSUE LT HAND Performed at [facility], [address] [place], [place] [address]    Special Requests   Final    [facility] FO[facility]ING [facility] Performed at [facility], [address] [place], [place] [address]    Gram Stain   Final    NO WBC SEEN RARE GRAM POSITIVE COCCI IN PAIRS IN CLUSTERS    Culture   Final    FEW STAPHYLOCOCCUS AUREUS NO ANAEROBES ISOLATED Performed at [facility], [address] [place], [place] [address]    Report Status 05/08/2020 FINAL  Final   Organism ID, Bacteria STAPHYLOCOCCUS AUREUS  Final      Susceptibility   Staphylococcus aureus - MIC*    CIPROFLOXACIN <=0.5 SENSITIVE Sensitive     ERYTHROMYCIN >=8 RESISTANT Resistant     GENTAMICIN <=0.5 SENSITIVE Sensitive     OXACILLIN 0.5 SENSITIVE Sensitive     TETRACYCLINE <=1 SENSITIVE Sensitive     VANCOMYCIN 1 SENSITIVE Sensitive     TRIMETH/SULFA <=10 SENSITIVE Sensitive     CLINDAMYCIN <=0.25 SENSITIVE Sensitive     RIFAMPIN <=0.5 SENSITIVE Sensitive     Inducible Clindamycin NEGATIVE Sensitive     * FEW STAPHYLOCOCCUS AUREUS  Fungus Culture Result     Status: None   Collection Time: 05/03/20  9:38 AM  Result Value Ref Range Status   Result 1 Comment  Final    Comment: (NOTE) KOH/Calcofluor preparation:  no fungus observed. Performed At: [facility] [address] [place], [place] [id number] Jolene SchimkeNagendra Sanjai MD OV:[id number]:[phone]   Culture, blood (Routine X 2) w Reflex to ID Panel     Status: None   Collection Time: 05/04/20  5:19 PM   Specimen: BLOOD  Result Value Ref Range Status   Specimen Description   Final    BLOOD RIGHT ANTECUBITAL Performed at [facility], [address], [place], [place] [address]    Special Requests   Final    BOTTLES DRAWN AEROBIC AND ANAEROBIC  Blood Culture adequate volume Performed at [facility], [address], [place], [place] [address]    Culture   Final    NO GROWTH 5 DAYS Performed at [facility], [address], [place], [place] [address]    Report Status 05/09/2020 FINAL  Final  Culture, blood (Routine X 2) w Reflex to ID Panel     Status: None   Collection Time: 05/04/20  5:19 PM   Specimen: BLOOD  Result Value Ref Range Status   Specimen Description   Final    BLOOD LEFT ANTECUBITAL Performed at [facility], [address] [place], [place] [address]    Special Requests   Final    BOTTLES DRAWN AEROBIC ONLY Blood Culture adequate volume Performed at [facility], [address], [place], [place] [address]    Culture   Final    NO GROWTH 5 DAYS Performed at [facility], [address] [place], [place] [address]    Report Status 05/09/2020 FINAL  Final  Aerobic/Anaerobic Culture (surgical/deep wound)     Status: None (Preliminary result)   Collection Time: 05/10/20  9:10 AM   Specimen: Soft Tissue, Other  Result Value Ref Range Status   Specimen Description TISSUE  Final   Special Requests TRICUSPID VALVE VEGETATION  Final   Gram Stain   Final    RARE WBC PRESENT, PREDOMINANTLY PMN ABUNDANT GRAM POSITIVE COCCI IN PAIRS IN CLUSTERS Performed at [facility], [address], [place], [place] [address]    Culture   Final    ABUNDANT METHICILLIN RESISTANT STAPHYLOCOCCUS AUREUS   Report Status PENDING  Incomplete   Organism ID, Bacteria METHICILLIN RESISTANT STAPHYLOCOCCUS AUREUS  Final      Susceptibility   Methicillin resistant staphylococcus aureus - MIC*    CIPROFLOXACIN <=0.5 SENSITIVE Sensitive     ERYTHROMYCIN >=8 RESISTANT Resistant     GENTAMICIN <=0.5 SENSITIVE Sensitive     OXACILLIN >=4 RESISTANT Resistant     TETRACYCLINE <=1 SENSITIVE Sensitive     VANCOMYCIN 1 SENSITIVE Sensitive     TRIMETH/SULFA <=10  SENSITIVE Sensitive     CLINDAMYCIN <=0.25 SENSITIVE Sensitive     RIFAMPIN <=0.5 SENSITIVE Sensitive     Inducible Clindamycin NEGATIVE Sensitive     * ABUNDANT METHICILLIN RESISTANT STAPHYLOCOCCUS AUREUS  Acid Fast Smear (AFB)     Status: None   Collection Time: 05/10/20  9:10 AM   Specimen: Soft Tissue, Other  Result Value Ref Range Status   AFB Specimen Processing Concentration  Final   Acid Fast Smear Negative  Final    Comment: (NOTE) Performed At: [facility] [address] [place], [place] [id number] Jolene Schimke MD OF:[id number]    Source (AFB) TRICUSPID VALVE VEGETATION  Final    Comment: Performed at [facility], [address], [place], [place] [address]    Studies/Results: VAS [place] UPPER EXTREMITY VENOUS DUPLEX  Result Date: 05/11/2020 UPPER VENOUS STUDY  Indications: Pain Limitations: Line and bandages. Comparison Study: no prior Performing Technologist: Blanch Media RVS  Examination Guidelines: A complete evaluation includes B-mode imaging, spectral Doppler, color Doppler, and power Doppler as needed of all accessible portions of each vessel. Bilateral testing is considered an integral part of a complete examination. Limited examinations for reoccurring indications may be performed as noted.  Left Findings: +----------+------------+---------+-----------+----------+--------------+ LEFT      CompressiblePhasicitySpontaneousProperties   Summary     +----------+------------+---------+-----------+----------+--------------+ IJV                                                 Not visualized +----------+------------+---------+-----------+----------+--------------+ Subclavian    Full       Yes       Yes                             +----------+------------+---------+-----------+----------+--------------+ Axillary      Full       Yes       Yes                             +----------+------------+---------+-----------+----------+--------------+  Brachial      Full       Yes       Yes                             +----------+------------+---------+-----------+----------+--------------+ Radial        Full                                                 +----------+------------+---------+-----------+----------+--------------+ Ulnar         Full                                                 +----------+------------+---------+-----------+----------+--------------+ Cephalic      Full                                                 +----------+------------+---------+-----------+----------+--------------+ Basilic       Full                                                 +----------+------------+---------+-----------+----------+--------------+  Summary:  Left: No evidence of deep vein thrombosis in the upper extremity. No evidence of superficial vein thrombosis in the upper extremity.  *See table(s) above for measurements and observations.  Diagnosing physician: Fabienne Bruns MD Electronically signed by Fabienne Bruns MD on 05/11/2020 at 6:10:59 PM.    Final       Assessment/Plan:  INTERVAL HISTORY:  Doppler negative for DVT  Principal Problem:   Endocarditis of tricuspid valve Active Problems:   IVDU (intravenous drug user)   Septic embolism (HCC)   Sepsis (HCC)   Thrombocytopenia (HCC)   Hypokalemia   Hyponatremia   MRSA bacteremia   Malnutrition of moderate degree   Threatening behavior   Hand abscess   Elevated BUN   Pressure injury of skin    Clifford Clark is a 29 y.o. 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  He now has some neck pain where his central line is as well as new onset headache.  1.  MSSA bacteremia with tricuspid valve endocarditis and septic emboli: He seems to have done well having undergone angio VAC and fevers have defervesced. (one isolated  fever yesterday) I would like to give him at least 4 weeks of IV cefazolin in the hospital and then consider switch to a bioavailable oral agent  Such as doxycycline 100 mg q12 and finish an additonal 2-4 wks  I will put in order for PICC line and central line can be DC central line  2.  Neck painDoppler  Negative and pain resolved  3.  Back pain: MRI did not show evidence of discitis in the thoracic or lumbar area but could have infection there and just not be showing up on MRI yet.  4.  For IV drug use: He will need plan for addressing this root cause   LOS: 14 days   Acey Lav 05/12/2020, 12:30 PM  ADDENDUM:  Patient is growing out MRSA as well as MSSA from his valves.  Interestingly the [facility] ID had identified MRSA but he had not phenotypically grown the organism.  We will switch from cefazolin to vancomycin.  Hospitals also concerned about bump in his liver function test.  I suspect this was due to his right-sided endocarditis potential right-sided failure.  PICC line appears to be in and central line cannot be discontinued

## 2020-05-12 NOTE — Progress Notes (Signed)
Pharmacy Antibiotic Note  Clifford Clark is a 29 y.o. male admitted on 04/28/2020 with sepsis.  Pharmacy has been consulted for vancomycin dosing.  Pt has a history of IVDU, admitted with sepsis and endocarditis. Blood and wrist cultures have grown MSSA, and the valve vegetation culture is showing two different S.aureus morphologies - one is MSSA and one is MRSA per discussion with the lab.  Vancomycin dosing history:  05/01/20  VT 31 this AM at 0958 = 31 mcg/ml, last dose 7/11 at 0200  SCr 0.8 > 1.01 > 1.38   Held Vancomycin, Per discussion with MD, not quite convinced it is all due to vanc as pt also has septic emboli to kidneys   05/02/20  V random level 0820 = 8, Vanc levels 31 > 8 over 22 hr  SCr decr to 1.0  Vanc resume at 750mg  q12    7/14 trough 0200 = 11, goal 15-20 mcg/ml  SCr decr to 0.88  Increase Vanc back to 750mg  q8h   Plan:  Resume Vancomycin 750mg  q8  Follow renal function and culture data. Goal VT 15-20 mcg/mL  Daily CMET, last 7/14  Plan six weeks abx  Height: 5\' 7"  (170.2 cm) Weight: 55 kg (121 lb 4.1 oz) IBW/kg (Calculated) : 66.1  Temp (24hrs), Avg:99.1 F (37.3 C), Min:98.3 F (36.8 C), Max:101.7 F (38.7 C)  Recent Labs  Lab 05/06/20 0220 05/06/20 1107 05/09/20 0307 05/12/20 0500  WBC 13.8*  --  14.7* 13.3*  CREATININE 0.65 0.60* 0.73 0.74    Estimated Creatinine Clearance: 106 mL/min (by C-G formula based on SCr of 0.74 mg/dL).    No Known Allergies  Antimicrobials this admission: cefepime 7/8 >> 7/9 vancomycin 7/8 >> 7/15, resumed 7/22 Metronidazole 7/8 >> 7/9  Cefazolin 7/16>>7/22  Dose adjustments this admission: 7/9 Received vanc 1000 mg x 2 before weight updated, adjusted to 750 mg IV q8h  7/11 VT 31 mcg/ml - hold today's doses and get another level with AM labs  7/12: V random 0820 = 8, prev 31 > 8 over 22 hr adjust Vanc to 750mg  q12 7/14: 0200 V trough = 11 on 750mg  q12, SCr to 0.88, adj Vanc back to 750mg   q8  Microbiology results: 7/8 BCx: BCID resulted 4/4 MRSA, BCx resulting as MSSA 7/8 UCx:  >100,000 colonies MSSA  7/8 MRSA PCR: positive  7/8 COVID-19 negative  7/8 HIV:  negative  7/8 hepatitis panel: hep C Ab positive 7/9: HCV RNA quant: not elevated  7/10 rpt BCx: NGTD  7/13 R WristCx & FungalCx: rare MSSA 7/13 L WristCx & FungalCx: few MSSA 7/14: Bcx NG 7/20 valve tissue:  MSSA and MRSA per lab report  Thank you for allowing pharmacy to be a part of this patient's care. , PharmD, BCPS-AQ ID Clinical Pharmacist Phone [phone]  05/12/2020, 3:10 PM

## 2020-05-12 NOTE — Progress Notes (Signed)
Attempted to remove sutures from R.Neck and R Groin, while trying to remove tape from patients neck he back to yell "mother fucker leave me alone, just give me morphine and leave me alone." I explained to patient that I needed to remove his sutures and that I had already given patient morphine. Patient continued to scream profanities. RN retaped bandage on R. Neck.

## 2020-05-13 ENCOUNTER — Inpatient Hospital Stay (HOSPITAL_COMMUNITY): Payer: Self-pay

## 2020-05-13 DIAGNOSIS — R079 Chest pain, unspecified: Secondary | ICD-10-CM

## 2020-05-13 DIAGNOSIS — R10819 Abdominal tenderness, unspecified site: Secondary | ICD-10-CM

## 2020-05-13 DIAGNOSIS — J9601 Acute respiratory failure with hypoxia: Secondary | ICD-10-CM

## 2020-05-13 DIAGNOSIS — A419 Sepsis, unspecified organism: Secondary | ICD-10-CM

## 2020-05-13 LAB — POCT I-STAT 7, (LYTES, BLD GAS, ICA,H+H)
Acid-Base Excess: 1 mmol/L (ref 0.0–2.0)
Bicarbonate: 26.4 mmol/L (ref 20.0–28.0)
Calcium, Ion: 1.1 mmol/L — ABNORMAL LOW (ref 1.15–1.40)
HCT: 28 % — ABNORMAL LOW (ref 39.0–52.0)
Hemoglobin: 9.5 g/dL — ABNORMAL LOW (ref 13.0–17.0)
O2 Saturation: 99 %
Potassium: 4.2 mmol/L (ref 3.5–5.1)
Sodium: 133 mmol/L — ABNORMAL LOW (ref 135–145)
TCO2: 28 mmol/L (ref 22–32)
pCO2 arterial: 45.9 mmHg (ref 32.0–48.0)
pH, Arterial: 7.369 (ref 7.350–7.450)
pO2, Arterial: 137 mmHg — ABNORMAL HIGH (ref 83.0–108.0)

## 2020-05-13 LAB — TYPE AND SCREEN
ABO/RH(D): O POS
Antibody Screen: NEGATIVE
Unit division: 0
Unit division: 0
Unit division: 0
Unit division: 0

## 2020-05-13 LAB — CBC WITH DIFFERENTIAL/PLATELET
Abs Immature Granulocytes: 0.6 10*3/uL — ABNORMAL HIGH (ref 0.00–0.07)
Basophils Absolute: 0.1 10*3/uL (ref 0.0–0.1)
Basophils Relative: 0 %
Eosinophils Absolute: 0.1 10*3/uL (ref 0.0–0.5)
Eosinophils Relative: 0 %
HCT: 24.5 % — ABNORMAL LOW (ref 39.0–52.0)
Hemoglobin: 8.1 g/dL — ABNORMAL LOW (ref 13.0–17.0)
Immature Granulocytes: 3 %
Lymphocytes Relative: 9 %
Lymphs Abs: 1.5 10*3/uL (ref 0.7–4.0)
MCH: 29.3 pg (ref 26.0–34.0)
MCHC: 33.1 g/dL (ref 30.0–36.0)
MCV: 88.8 fL (ref 80.0–100.0)
Monocytes Absolute: 0.6 10*3/uL (ref 0.1–1.0)
Monocytes Relative: 4 %
Neutro Abs: 14.9 10*3/uL — ABNORMAL HIGH (ref 1.7–7.7)
Neutrophils Relative %: 84 %
Platelets: 124 10*3/uL — ABNORMAL LOW (ref 150–400)
RBC: 2.76 MIL/uL — ABNORMAL LOW (ref 4.22–5.81)
RDW: 14.9 % (ref 11.5–15.5)
WBC: 17.8 10*3/uL — ABNORMAL HIGH (ref 4.0–10.5)
nRBC: 0.1 % (ref 0.0–0.2)

## 2020-05-13 LAB — LACTIC ACID, PLASMA
Lactic Acid, Venous: 1.5 mmol/L (ref 0.5–1.9)
Lactic Acid, Venous: 1.2 mmol/L (ref 0.5–1.9)

## 2020-05-13 LAB — BLOOD GAS, ARTERIAL
Acid-Base Excess: 4 mmol/L — ABNORMAL HIGH (ref 0.0–2.0)
Bicarbonate: 27 mmol/L (ref 20.0–28.0)
Drawn by: 519031
FIO2: 36
O2 Saturation: 94.9 %
Patient temperature: 37.4
pCO2 arterial: 34.8 mmHg (ref 32.0–48.0)
pH, Arterial: 7.504 — ABNORMAL HIGH (ref 7.350–7.450)
pO2, Arterial: 70.6 mmHg — ABNORMAL LOW (ref 83.0–108.0)

## 2020-05-13 LAB — URINALYSIS, ROUTINE W REFLEX MICROSCOPIC
Bilirubin Urine: NEGATIVE
Glucose, UA: 50 mg/dL — AB
Ketones, ur: NEGATIVE mg/dL
Leukocytes,Ua: NEGATIVE
Nitrite: NEGATIVE
Protein, ur: 100 mg/dL — AB
RBC / HPF: >50 RBC/hpf — ABNORMAL HIGH (ref 0–5)
Specific Gravity, Urine: 1.035 — ABNORMAL HIGH (ref 1.005–1.030)
pH: 5 (ref 5.0–8.0)

## 2020-05-13 LAB — BPAM RBC
Blood Product Expiration Date: 202108082359
Blood Product Expiration Date: 202108162359
Blood Product Expiration Date: 202108202359
Blood Product Expiration Date: 202108202359
ISSUE DATE / TIME: 202107200810
ISSUE DATE / TIME: 202107200810
Unit Type and Rh: 5100
Unit Type and Rh: 5100
Unit Type and Rh: 5100
Unit Type and Rh: 5100

## 2020-05-13 LAB — COMPREHENSIVE METABOLIC PANEL
ALT: 40 U/L (ref 0–44)
ALT: 41 U/L (ref 0–44)
AST: 37 U/L (ref 15–41)
AST: 39 U/L (ref 15–41)
Albumin: 1.4 g/dL — ABNORMAL LOW (ref 3.5–5.0)
Albumin: 1.5 g/dL — ABNORMAL LOW (ref 3.5–5.0)
Alkaline Phosphatase: 110 U/L (ref 38–126)
Alkaline Phosphatase: 120 U/L (ref 38–126)
Anion gap: 8 (ref 5–15)
Anion gap: 9 (ref 5–15)
BUN: 8 mg/dL (ref 6–20)
BUN: 13 mg/dL (ref 6–20)
CO2: 27 mmol/L (ref 22–32)
CO2: 24 mmol/L (ref 22–32)
Calcium: 7.5 mg/dL — ABNORMAL LOW (ref 8.9–10.3)
Calcium: 7.4 mg/dL — ABNORMAL LOW (ref 8.9–10.3)
Chloride: 97 mmol/L — ABNORMAL LOW (ref 98–111)
Chloride: 98 mmol/L (ref 98–111)
Creatinine, Ser: 0.7 mg/dL (ref 0.61–1.24)
Creatinine, Ser: 0.71 mg/dL (ref 0.61–1.24)
GFR calc Af Amer: >60 mL/min (ref >60)
GFR calc Af Amer: >60 mL/min (ref >60)
GFR calc non Af Amer: >60 mL/min (ref >60)
GFR calc non Af Amer: >60 mL/min (ref >60)
Glucose, Bld: 117 mg/dL — ABNORMAL HIGH (ref 70–99)
Glucose, Bld: 163 mg/dL — ABNORMAL HIGH (ref 70–99)
Potassium: 4.1 mmol/L (ref 3.5–5.1)
Potassium: 4.1 mmol/L (ref 3.5–5.1)
Sodium: 132 mmol/L — ABNORMAL LOW (ref 135–145)
Sodium: 131 mmol/L — ABNORMAL LOW (ref 135–145)
Total Bilirubin: 0.6 mg/dL (ref 0.3–1.2)
Total Bilirubin: 0.8 mg/dL (ref 0.3–1.2)
Total Protein: 6.2 g/dL — ABNORMAL LOW (ref 6.5–8.1)
Total Protein: 6.3 g/dL — ABNORMAL LOW (ref 6.5–8.1)

## 2020-05-13 LAB — CBC
HCT: 26.2 % — ABNORMAL LOW (ref 39.0–52.0)
Hemoglobin: 8.5 g/dL — ABNORMAL LOW (ref 13.0–17.0)
MCH: 29.2 pg (ref 26.0–34.0)
MCHC: 32.4 g/dL (ref 30.0–36.0)
MCV: 90 fL (ref 80.0–100.0)
Platelets: 123 10*3/uL — ABNORMAL LOW (ref 150–400)
RBC: 2.91 MIL/uL — ABNORMAL LOW (ref 4.22–5.81)
RDW: 15 % (ref 11.5–15.5)
WBC: 15.7 10*3/uL — ABNORMAL HIGH (ref 4.0–10.5)
nRBC: 0 % (ref 0.0–0.2)

## 2020-05-13 LAB — MAGNESIUM
Magnesium: 1.8 mg/dL (ref 1.7–2.4)
Magnesium: 1.9 mg/dL (ref 1.7–2.4)

## 2020-05-13 LAB — TROPONIN I (HIGH SENSITIVITY)
Troponin I (High Sensitivity): 192 ng/L (ref <18)
Troponin I (High Sensitivity): 190 ng/L (ref <18)

## 2020-05-13 LAB — PHOSPHORUS: Phosphorus: 4.1 mg/dL (ref 2.5–4.6)

## 2020-05-13 LAB — MRSA PCR SCREENING: MRSA by PCR: POSITIVE — AB

## 2020-05-13 MED ORDER — ETOMIDATE 2 MG/ML IV SOLN
20.0000 mg | Freq: Once | INTRAVENOUS | Status: AC
  Administered 2020-05-13: 20 mg via INTRAVENOUS

## 2020-05-13 MED ORDER — CHLORHEXIDINE GLUCONATE CLOTH 2 % EX PADS
6.0000 | MEDICATED_PAD | Freq: Every day | CUTANEOUS | Status: AC
  Administered 2020-05-14 – 2020-05-16 (×4): 6 via TOPICAL

## 2020-05-13 MED ORDER — NOREPINEPHRINE 4 MG/250ML-% IV SOLN
2.0000 ug/min | INTRAVENOUS | Status: DC
  Administered 2020-05-13: 2 ug/min via INTRAVENOUS
  Administered 2020-05-14 – 2020-05-15 (×3): 10 ug/min via INTRAVENOUS
  Filled 2020-05-13 (×4): qty 250

## 2020-05-13 MED ORDER — PANTOPRAZOLE SODIUM 20 MG PO TBEC
20.0000 mg | DELAYED_RELEASE_TABLET | Freq: Every day | ORAL | Status: DC
  Administered 2020-05-13: 20 mg via ORAL
  Filled 2020-05-13 (×2): qty 1

## 2020-05-13 MED ORDER — FENTANYL CITRATE (PF) 100 MCG/2ML IJ SOLN
50.0000 ug | INTRAMUSCULAR | Status: AC | PRN
  Administered 2020-05-13 – 2020-05-14 (×3): 50 ug via INTRAVENOUS
  Filled 2020-05-13 (×2): qty 2

## 2020-05-13 MED ORDER — MIDAZOLAM HCL 2 MG/2ML IJ SOLN
INTRAMUSCULAR | Status: AC
  Filled 2020-05-13: qty 4

## 2020-05-13 MED ORDER — PROPOFOL 1000 MG/100ML IV EMUL
0.0000 ug/kg/min | INTRAVENOUS | Status: DC
  Administered 2020-05-13: 50 ug/kg/min via INTRAVENOUS
  Administered 2020-05-14: 35 ug/kg/min via INTRAVENOUS
  Administered 2020-05-14: 50 ug/kg/min via INTRAVENOUS
  Administered 2020-05-14 (×2): 45 ug/kg/min via INTRAVENOUS
  Administered 2020-05-15 (×2): 30 ug/kg/min via INTRAVENOUS
  Administered 2020-05-15 – 2020-05-16 (×2): 35 ug/kg/min via INTRAVENOUS
  Administered 2020-05-16 – 2020-05-18 (×4): 30 ug/kg/min via INTRAVENOUS
  Filled 2020-05-13 (×11): qty 100
  Filled 2020-05-13: qty 200
  Filled 2020-05-13 (×2): qty 100

## 2020-05-13 MED ORDER — FENTANYL CITRATE (PF) 100 MCG/2ML IJ SOLN
INTRAMUSCULAR | Status: AC
  Administered 2020-05-13: 50 ug
  Filled 2020-05-13: qty 2

## 2020-05-13 MED ORDER — NOREPINEPHRINE 4 MG/250ML-% IV SOLN
INTRAVENOUS | Status: AC
  Administered 2020-05-13: 10 ug/min via INTRAVENOUS
  Filled 2020-05-13: qty 250

## 2020-05-13 MED ORDER — IOHEXOL 350 MG/ML SOLN
100.0000 mL | Freq: Once | INTRAVENOUS | Status: AC | PRN
  Administered 2020-05-13: 100 mL via INTRAVENOUS

## 2020-05-13 MED ORDER — ROCURONIUM BROMIDE 10 MG/ML (PF) SYRINGE
100.0000 mg | PREFILLED_SYRINGE | Freq: Once | INTRAVENOUS | Status: AC
  Administered 2020-05-13: 100 mg via INTRAVENOUS

## 2020-05-13 MED ORDER — POLYETHYLENE GLYCOL 3350 17 G PO PACK: 17.0000 g | PACK | Freq: Every day | ORAL | Status: DC

## 2020-05-13 MED ORDER — IOHEXOL 350 MG/ML SOLN
80.0000 mL | Freq: Once | INTRAVENOUS | Status: AC | PRN
  Administered 2020-05-13: 80 mL via INTRAVENOUS

## 2020-05-13 MED ORDER — SODIUM CHLORIDE 0.9 % IV SOLN
1.0000 g | Freq: Three times a day (TID) | INTRAVENOUS | Status: DC
  Administered 2020-05-13 – 2020-05-14 (×2): 1 g via INTRAVENOUS
  Filled 2020-05-13 (×5): qty 1

## 2020-05-13 MED ORDER — FENTANYL CITRATE (PF) 100 MCG/2ML IJ SOLN
50.0000 ug | INTRAMUSCULAR | Status: DC | PRN
  Administered 2020-05-13: 50 ug via INTRAVENOUS
  Administered 2020-05-14: 200 ug via INTRAVENOUS
  Administered 2020-05-14: 100 ug via INTRAVENOUS
  Administered 2020-05-14: 200 ug via INTRAVENOUS
  Administered 2020-05-14: 100 ug via INTRAVENOUS
  Administered 2020-05-14: 200 ug via INTRAVENOUS
  Filled 2020-05-13 (×2): qty 2
  Filled 2020-05-13 (×2): qty 4
  Filled 2020-05-13: qty 2
  Filled 2020-05-13: qty 4
  Filled 2020-05-13: qty 2

## 2020-05-13 MED ORDER — MUPIROCIN 2 % EX OINT
1.0000 "application " | TOPICAL_OINTMENT | Freq: Two times a day (BID) | CUTANEOUS | Status: AC
  Administered 2020-05-13 – 2020-05-18 (×10): 1 via NASAL
  Filled 2020-05-13 (×2): qty 22

## 2020-05-13 MED ORDER — SODIUM CHLORIDE 0.9 % IV SOLN
250.0000 mL | INTRAVENOUS | Status: DC
  Administered 2020-05-18 – 2020-06-08 (×7): 250 mL via INTRAVENOUS

## 2020-05-13 NOTE — Consult Note (Signed)
NAME:  Clifford Clark, MRN:  [id number], DOB:  1991/03/19, LOS: 15 ADMISSION DATE:  04/28/2020, CONSULTATION DATE:  05/13/2020 REFERRING MD:  Albertine Grates CHIEF COMPLAINT:  Endocarditis  Brief History   29 yo male with PMH significant for IVDA (fentanyl), asthma admitted with tricuspid valve endocarditis, MRSA bacteremia.   History of present illness   Patient presented to ED on 7/08 after being found on the ground by a friend.  His work up revealed Tricuspid valve endocarditis with MRSA bacteremia. He was also found to have septic emboli to kidneys and lungs. CT of hands showed bilateral abscesses and underwent I&D on 7/13. 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 Patient was found to have increasing tachycardia and tachypnea today.   Patient is negative 4 L to date and negative 700 ml on I/O.  T max 103. A CTA Chest was completed, post imaging, patient returned to room and found to have respiratory rate in 50's.  CCM consulted and patient transferred to ICU for intubation and mechanical ventilation. Post intubation, patient did require levo during intubation.   Past Medical History  IVDA --Fentanyl Asthma   Significant Hospital Events   7/08: Admitted 7/13: I&D of bilateral and abscesses  Consults:  ID Orthopedics CVTS  Psych PCCM  Procedures:  7/13: I&D of bilateral and abscesses 7/20: Debridement of right atrial mass and debridement of tricuspid valve vegetation 7/22: PICC line placed 7/23: CVC 7/23: ETT  Significant Diagnostic Tests:  7/8: Abdominal U/S: Hepatomegaly 7/16: TEE: LVEF 60-65%, RV normal, left atrial thrombus, small pericardial effusion, MVR. Tricuspid valve vegetation.  Pulmonic valve vegetation. 7/21: [place] of LUE without evidence of superficial or deep vein thrombosis  7/23: CTA chest without PE, multifocal PNA and septic emboli  Micro Data:  7/8 Blood Cx: MRSA Bacteremia 7/13:  Blood Cx: MRSA Bacteremia 7/13: Fungal Cx: NGTD 7/20: Blood Cx:  MRSA Bacteremia 7/23 Blood Cx: >> 7/23 respiratory Cx >>  Antimicrobials:  Vanc Mero 7/23  Interim history/subjective:  New consult, see above  Objective   Blood pressure (!) 122/89, pulse (!) 139, temperature (!) 103.1 F (39.5 C), temperature source Axillary, resp. rate (!) 56, height 5\' 7"  (1.702 m), weight 55 kg, SpO2 95 %.        Intake/Output Summary (Last 24 hours) at 05/13/2020 1628 Last data filed at 05/12/2020 2312 Gross per 24 hour  Intake 311.06 ml  Output 1050 ml  Net -738.94 ml   Filed Weights   04/28/20 2130  Weight: 55 kg    Examination: General: Adult male, severe distress HENT: AT/[place] Lungs: Bilateral coarse lung fields, tachypnea, shallow respiratory rate  Cardiovascular: no edema, ST on telemetry, 2 + pulses DP Abdomen: tender to touch, guarding Extremities: no deformities Neuro: alert and oriented without focal deficits  Resolved Hospital Problem list   AKI Elevated LFTs  Assessment & Plan:  Acute hypoxic respiratory failure --Intubation  --CTA Chest: negative for PE, multifocal PNA with multiple reactive lymph nodes --Continue full vent support with lung protective strategies, abx as below.  --Repeat ABG post intubation pending. 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  --103 today --rising WBC --Abdominal tenderness on exam: CT Angio Abdomen , high risk for ischemic bowel  --Repeat Blood Cx --Respiratory culture --Expand abx coverage to Vanco  & Mero  Bilateral hand abscess --Ortho following --s/p I&D 7/13  Anemia Thrombocytopenia, 124 --Likely multifactorial, critical illness/sepsis and malnutrition --Received 1 unit PRBC on 7/16 --Continue to trend --goal Hgb > 7  Hyponatremia --Continue to trend  Elevated troponin --likely due to demand --Peak at 192 and downtrending --EKG pending  Best practice:  Diet: NPO, nutrition consult after CTA Abdomen completed.  Pain/Anxiety/Delirium protocol (if indicated): Propofol drip with PRN fentanyl VAP protocol (if indicated): VAP bundle DVT prophylaxis: Lovenox GI prophylaxis: Protonix Glucose control: Monitor daily labs Mobility: Bedrest Code Status: Full Family Communication: Mother updated Disposition: ICU  Labs   CBC: Recent Labs  Lab 05/09/20 0307 05/10/20 0802 05/10/20 0926 05/12/20 0500 05/13/20 0839  WBC 14.7*  --   --  13.3* 17.8*  NEUTROABS  --   --   --   --  14.9*  HGB 7.2* 6.5* 8.2* 8.4* 8.1*  HCT 22.8* 19.0* 24.0* 25.6* 24.5*  MCV 88.4  --   --  89.2 88.8  PLT 178  --   --  145* 124*    Basic Metabolic Panel: Recent Labs  Lab 05/09/20 0307 05/10/20 0802 05/10/20 0926 05/12/20 0500 05/13/20 0839  NA 136 138 138 133* 132*  K 4.3 4.2 4.3 4.2 4.1  CL 102  --   --  101 97*  CO2 25  --   --  25 27  GLUCOSE 116*  --   --  107* 117*  BUN 10  --   --  10 8  CREATININE 0.73  --   --  0.74 0.70  CALCIUM 7.7*  --   --  7.4* 7.5*  MG  --   --   --  1.8 1.8   GFR: Estimated Creatinine Clearance: 106 mL/min (by C-G formula based on SCr of 0.7 mg/dL). Recent Labs  Lab 05/09/20 0307 05/12/20 0500 05/13/20 0839  WBC 14.7* 13.3* 17.8*    Liver Function Tests: Recent Labs  Lab 05/09/20 0307 05/12/20 0500 05/13/20 0839  AST 25 105* 37  ALT 16 83* 40  ALKPHOS 69 124 110  BILITOT 0.5 0.6 0.6  PROT 5.6* 5.8* 6.2*  ALBUMIN 1.4* 1.5* 1.4*   No results for input(s): LIPASE, AMYLASE in the last 168 hours. No results for input(s): AMMONIA in  the last 168 hours.  ABG    Component Value Date/Time   PHART 7.504 (H) 05/13/2020 1434   PCO2ART 34.8 05/13/2020 1434   PO2ART 70.6 (L) 05/13/2020 1434   HCO3 27.0 05/13/2020 1434   TCO2 32 05/10/2020 0926   ACIDBASEDEF 0.6 05/06/2020 2215   O2SAT 94.9 05/13/2020 1434     Coagulation Profile: No results for input(s): INR, PROTIME in the last 168 hours.  Cardiac Enzymes: No results for input(s): CKTOTAL, CKMB, CKMBINDEX, TROPONINI in the last 168 hours.  HbA1C: No results found for: HGBA1C  CBG: No results for input(s): GLUCAP in the last 168 hours.  Review of Systems:   Unable to obtain due to clinical status   Past Medical History  He,  has a past medical history of Asthma and Opiate abuse, continuous (HCC).   Surgical History    Past Surgical History:  Procedure Laterality Date  . APPLICATION OF ANGIOVAC N/A 05/10/2020  Procedure: APPLICATION OF ANGIOVAC;  Surgeon: [name], MD;  Location: [facility] OR;  Service: Vascular;  Laterality: N/A;  . BUBBLE STUDY  05/06/2020   Procedure: BUBBLE STUDY;  Surgeon: [name], MD;  Location: [facility] ENDOSCOPY;  Service: Cardiovascular;;  . I & D EXTREMITY Bilateral 05/03/2020   Procedure: BILATERAL IRRIGATION AND DEBRIDEMENT HANDS;  Surgeon: [name], MD;  Location: [facility] ORS;  Service: Orthopedics;  Laterality: Bilateral;  . TEE WITHOUT CARDIOVERSION N/A 05/06/2020   Procedure: TRANSESOPHAGEAL ECHOCARDIOGRAM (TEE);  Surgeon: [name], MD;  Location: [facility] ENDOSCOPY;  Service: Cardiovascular;  Laterality: N/A;  . TEE WITHOUT CARDIOVERSION N/A 05/10/2020   Procedure: TRANSESOPHAGEAL ECHOCARDIOGRAM (TEE);  Surgeon: [name], MD;  Location: [facility] OR;  Service: Thoracic;  Laterality: N/A;     Social History   reports that he has quit smoking. He smoked 0.50 packs per day. He has never used smokeless tobacco. He reports previous alcohol use. He reports previous drug use. Drugs: Marijuana, IV, "Crack" cocaine,  Cocaine, Fentanyl, Methamphetamines, and Amphetamines.   Family History   His family history is not on file.   Allergies No Known Allergies   Home Medications  Prior to Admission medications   Medication Sig Start Date End Date Taking? Authorizing Provider  naloxone [facility]) 0.4 MG/ML injection Take as needed for accidental opiate overdose Patient taking differently: Place 0.4 mg into the nose as needed (overdose). Take as needed for accidental opiate overdose 10/07/19   Shaune Pollack, MD     Critical care time: 55 min    Earney Hamburg, ACNP [facility]  After hours pager: [phone]

## 2020-05-13 NOTE — Progress Notes (Signed)
   05/13/20 1500  Assess: MEWS Score  Temp 98.8 F (37.1 C)  BP (!) 125/95  Pulse Rate (!) 139  ECG Heart Rate (!) 139  Resp (!) 57  Level of Consciousness Alert  SpO2 93 %  O2 Device Nasal Cannula  O2 Flow Rate (L/min) 4 L/min  Assess: MEWS Score  MEWS Temp 0  MEWS Systolic 0  MEWS Pulse 3  MEWS RR 3  MEWS LOC 0  MEWS Score 6  MEWS Score Color Red  Treat  Pain Scale 0-10  Pain Score 5  Pain Type Acute pain  Pain Location Generalized  Pain Descriptors / Indicators Constant  Pain Frequency Constant  Pain Onset On-going  Complains of Anxiety  new orders for ABG, CT scan,

## 2020-05-13 NOTE — Progress Notes (Signed)
Patient ID: Clifford Clark, male   DOB: 1990-10-24, 29 y.o.   MRN: [id number]      [address]       [name] [address]             [phone]                 3 Days Post-Op Procedure(s) (LRB): APPLICATION OF ANGIOVAC (N/A) TRANSESOPHAGEAL ECHOCARDIOGRAM (TEE) (N/A)  LOS: 15 days   Subjective: Asked by the nurses to look at patient's right groin patient is status post angio back with vein cannulation right leg   Objective: Vital signs in last 24 hours: Patient Vitals for the past 24 hrs:  BP Temp Temp src Pulse Resp SpO2  05/13/20 1155 (!) 113/86 99.4 F (37.4 C) Axillary (!) 40 (!) 40 97 %  05/13/20 1100 (!) 114/87 -- -- (!) 123 (!) 38 95 %  05/13/20 1000 112/82 -- -- (!) 122 (!) 34 96 %  05/13/20 0900 115/85 -- -- 86 (!) 35 94 %  05/13/20 0847 (!) 112/88 -- -- (!) 117 (!) 31 96 %  05/13/20 0804 (!) 118/87 98.9 F (37.2 C) Oral (!) 136 (!) 34 93 %  05/13/20 0745 (!) 129/84 98.9 F (37.2 C) Oral (!) 135 (!) 33 (!) 86 %  05/13/20 0457 -- 98.6 F (37 C) -- (!) 127 -- --  05/13/20 0430 (!) 124/86 -- -- -- 19 --  05/13/20 0400 (!) 133/103 -- -- (!) 138 (!) 26 99 %  05/13/20 0347 (!) 130/89 98.2 F (36.8 C) Oral (!) 123 (!) 25 95 %  05/13/20 0330 (!) 130/89 -- -- (!) 135 20 95 %  05/13/20 0319 -- 98.6 F (37 C) -- (!) 123 (!) 24 --  05/13/20 0317 (!) 117/88 -- -- -- 23 --  05/13/20 0000 -- 98.9 F (37.2 C) -- -- -- --  05/12/20 2310 106/69 98.5 F (36.9 C) Oral (!) 116 (!) 30 96 %  05/12/20 2100 (!) 125/86 -- -- (!) 129 -- --  05/12/20 1900 109/69 97.7 F (36.5 C) Oral (!) 126 (!) 25 92 %  05/12/20 1600 -- -- -- (!) 131 20 90 %    Filed Weights   04/28/20 2130  Weight: 55 kg    Hemodynamic parameters for last 24 hours:    Intake/Output from previous day: 07/22 0701 - 07/23 0700 In: 311.1 [IV Piggyback:311.1] Out: 1050 [Urine:1050] Intake/Output this shift: No intake/output data recorded.  Scheduled Meds: . Chlorhexidine Gluconate Cloth  6  each Topical Q0600  . docusate sodium  100 mg Oral BID  . enoxaparin (LOVENOX) injection  40 mg Subcutaneous Q24H  . mouth rinse  15 mL Mouth Rinse BID  . metoprolol tartrate  12.5 mg Oral BID  . multivitamin with minerals  1 tablet Oral Daily  . pantoprazole  20 mg Oral Daily  . Ensure Max Protein  11 oz Oral Daily  . sodium chloride flush  10-40 mL Intracatheter Q12H  . sodium chloride flush  3 mL Intravenous Q12H   Continuous Infusions: . vancomycin 750 mg (05/13/20 0845)   PRN Meds:.acetaminophen **OR** acetaminophen, ipratropium-albuterol, LORazepam, metoprolol tartrate, morphine injection, ondansetron **OR** ondansetron (ZOFRAN) IV, oxyCODONE, sodium chloride flush  On exam the patient is slightly tender in the mid lower abdomen over the bladder, the right puncture site from the NG VAC placement is intact without any significant bleeding or surrounding hematoma.  After the exam patient voided 500 mL.  Follow-up bladder  scan showed minimal residual, reexam he was less tender  We will continue to monitor lower abdominal exam    Lab Results: CBC: Recent Labs    05/12/20 0500 05/13/20 0839  WBC 13.3* 17.8*  HGB 8.4* 8.1*  HCT 25.6* 24.5*  PLT 145* 124*   BMET:  Recent Labs    05/12/20 0500 05/13/20 0839  NA 133* 132*  K 4.2 4.1  CL 101 97*  CO2 25 27  GLUCOSE 107* 117*  BUN 10 8  CREATININE 0.74 0.70  CALCIUM 7.4* 7.5*    PT/INR: No results for input(s): LABPROT, INR in the last 72 hours.   Radiology DG CHEST PORT 1 VIEW  Result Date: 05/13/2020 CLINICAL DATA:  Chest pain.  History of endocarditis. EXAM: PORTABLE CHEST 1 VIEW COMPARISON:  05/10/2020 FINDINGS: The left jugular central venous catheter has been removed. A right PICC has been placed and terminates over the high right atrium. The cardiomediastinal silhouette is unchanged. Widespread patchy and nodular opacities throughout the right greater than left lungs have not significantly changed. Mild  underlying interstitial densities are also again noted. A small right pleural effusion is questioned. No pneumothorax is identified. IMPRESSION: 1. Interval right PICC placement. 2. Unchanged bilateral lung opacities which may reflect septic emboli and pneumonia as well as possible underlying edema. Electronically Signed   By: Sebastian Ache M.D.   On: 05/13/2020 08:30   VAS [place] UPPER EXTREMITY VENOUS DUPLEX  Result Date: 05/11/2020 UPPER VENOUS STUDY  Indications: Pain Limitations: Line and bandages. Comparison Study: no prior Performing Technologist: Blanch Media RVS  Examination Guidelines: A complete evaluation includes B-mode imaging, spectral Doppler, color Doppler, and power Doppler as needed of all accessible portions of each vessel. Bilateral testing is considered an integral part of a complete examination. Limited examinations for reoccurring indications may be performed as noted.  Left Findings: +----------+------------+---------+-----------+----------+--------------+ LEFT      CompressiblePhasicitySpontaneousProperties   Summary     +----------+------------+---------+-----------+----------+--------------+ IJV                                                 Not visualized +----------+------------+---------+-----------+----------+--------------+ Subclavian    Full       Yes       Yes                             +----------+------------+---------+-----------+----------+--------------+ Axillary      Full       Yes       Yes                             +----------+------------+---------+-----------+----------+--------------+ Brachial      Full       Yes       Yes                             +----------+------------+---------+-----------+----------+--------------+ Radial        Full                                                 +----------+------------+---------+-----------+----------+--------------+ Ulnar  Full                                                  +----------+------------+---------+-----------+----------+--------------+ Cephalic      Full                                                 +----------+------------+---------+-----------+----------+--------------+ Basilic       Full                                                 +----------+------------+---------+-----------+----------+--------------+  Summary:  Left: No evidence of deep vein thrombosis in the upper extremity. No evidence of superficial vein thrombosis in the upper extremity.  *See table(s) above for measurements and observations.  Diagnosing physician: Fabienne Bruns MD Electronically signed by Fabienne Bruns MD on 05/11/2020 at 6:10:59 PM.    Final    [place] EKG SITE RITE  Result Date: 05/12/2020 If Site Rite image not attached, placement could not be confirmed due to current cardiac rhythm.    Assessment/Plan: S/P Procedure(s) (LRB): APPLICATION OF ANGIOVAC (N/A) TRANSESOPHAGEAL ECHOCARDIOGRAM (TEE) (N/A) Stable after angiovac procedure - vein access site stable     Delight Ovens MD 05/13/2020 1:44 PM

## 2020-05-13 NOTE — Procedures (Signed)
Central Venous Catheter Insertion Procedure Note  Rodriques Badie  [id number]  [birth year]  Date:05/13/20  Time:5:47 PM   Provider Performing:Genova Kiner   Procedure: Insertion of Non-tunneled Central Venous [phone]) with US guidance ([id number])   Indication(s) Medication administration  Consent Risks of the procedure as well as the alternatives and risks of each were explained to the patient and/or caregiver.  Consent for the procedure was obtained and is signed in the bedside chart  Anesthesia Topical only with 1% lidocaine   Timeout Verified patient identification, verified procedure, site/side was marked, verified correct patient position, special equipment/implants available, medications/allergies/relevant history reviewed, required imaging and test results available.  Sterile Technique Maximal sterile technique including full sterile barrier drape, hand hygiene, sterile gown, sterile gloves, mask, hair covering, sterile ultrasound probe cover (if used).  Procedure Description Area of catheter insertion was cleaned with chlorhexidine and draped in sterile fashion.  With real-time ultrasound guidance a central venous catheter was placed into the left subclavian vein. Nonpulsatile blood flow and easy flushing noted in all ports.  The catheter was sutured in place and sterile dressing applied.  Complications/Tolerance None; patient tolerated the procedure well. Chest X-ray is ordered to verify placement for internal jugular or subclavian cannulation.   Chest x-ray is not ordered for femoral cannulation.  EBL Minimal  Specimen(s) None

## 2020-05-13 NOTE — Progress Notes (Addendum)
PROGRESS NOTE    Pierce Barocio  JQZ:[id number] DOB: [birth year] DOA: 04/28/2020 PCP: Patient, [name]    Chief Complaint  Patient presents with  . Drug Problem    Brief Narrative:  PCP: Patient, [name]  CC: found asleep on the ground  Hospital Course: Mr. 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 homeless, asthma who presented to the ER after being found on the ground sleeping by a friend he stated.  EMS was called and he was transported to the hospital. In the ER with multiple imaging studies including x-ray of his right hand and wrist, x-ray of left hand, CXR, CT chest/abdomen/pelvis, CT C-spine, and CT head.  There were no acute fractures in his bilateral upper extremities.  CT head also unremarkable.  Remainder of his imaging studies revealed what appeared to be septic pulmonary emboli as well as septic emboli involving both kidneys. Cultures were drawn and he was started on vancomycin, cefepime, and Flagyl in the ER - 4/4 blood cultures were growing MRSA. TTE also performed on 7/9 revealing TV vegetation. Became verbally abusive/threatening 05/01/20 and Psych was consulted and a sitter was requested.  7/12 Patient developed worsening swelling in his right wrist and left hand. There was concern for septic joint or bursitis. A CT was ordered bilaterally which did show bilateral abscesses. He is transferred from [place] to [place] for orthoconsult 7/13 Bilateral I&D by Dr Eulah Pont, complaining of pain - admits to taking Carfentanyl at home/off the street.  7/16 TEE shows large tricuspid vegetation -  transfer to [facility] for CT Sx to evaluate for angiovac 7/20 went to OR with CVTS: Debridement of right atrial mass - Debridement of tricuspid valve vegetation  Subjective:  Isolated fever 101.7 at 7 PM on 7/21 C/o chest pain, left sided back pain, tachypneic, tachycardia, bp stable, no hypoxia  He is on 2 L of oxygen supplement, he denies  short of breath, no cough noticed,  Right hand edema has improved  Assessment & Plan:   Principal Problem:   Endocarditis of tricuspid valve Active Problems:   IVDU (intravenous drug user)   Septic embolism (HCC)   Sepsis (HCC)   Thrombocytopenia (HCC)   Hypokalemia   Hyponatremia   MRSA bacteremia   Malnutrition of moderate degree   Threatening behavior   Hand abscess   Elevated BUN   Pressure injury of skin  MSSA /MRSA bacteremia with concurrent endocarditis in the setting of IV drug abuse - TTE performed on 04/29/20: TV vegetation (62mm) noted as well as possible veg on PV - TEE shows large mobile vegetation on tricuspid valve and small vegetation on pulmonic valve with no PFO -  - -S/p angiovac on 7/ 21, -Initial BC ID positive for MRSA, blood culture positive for MSSA, tissue culture from tricuspid valve vegetation [place] [facility] -He was on Ancef ,  changed to vanc on July 22 -appreciate ID  And cardiothoracic surgeryrecommendation - central line removed, PICC placed on July 22; patient will need to remain in hospital for duration of treatment course; no indication for IVC if tries to leave AMA but PICC will have to be removed -pain control difficult-- will try to alternate PO and IV  He complains of chest pain this morning, was tachypneic with tachycardia, no hypoxia, blood pressure stable Stat EKG no acute changes, chest x-ray no acute interval changes, high-sensitivity troponin I 92, awaiting for second troponin.  Chest pain  less likely from ACS, has improved with morphine and Ativan, continue monitor.  Addendum; high sensitive troponin flat at 190x2, in the afternoon he become more tachypneic, RR rate in the 40's though no significant hypoxia on 3liter, will get stat abg and check CTA to rule out PE.   4pm Addendum: patient continue to be tachypneic, RR now in the upper 50's, pulmonary /critical care consulted.   Back pain:  MRI T/L spine negative for acute findings Prn  analgesic, may need to repeat MRI if pain persist or get worse  Acute respiratory failure, septic emboli -x ray on 7/16 shows: Numerous small, stable masslike opacities scattered throughout both Lungs -monitor closely -wean O2 as able  Hand abscess, bilateral, s/p I&D - Likely secondary to above - Bilateral abscesses on hands, s/p CT on 7/11 - Appreciate hand surgery Dr. Eulah PontMurphy consult; I&D on 7/13 -encouraged him to keep elevated for swelling, edema is improving   Sepsis (HCC) secondary to above, present on admission - See MSSA bacteremia  Sinus tachycardia: TSH unremarkable Likely reactive, schedule Lopressor with holding parameters  Acute metabolic encephalopathy, present on admission ,resolved - Considered due to IV drugs as well as sepsis/infection - Mentation has cleared and diet started   Anemia, likely multifactorial Acute (questionably blood loss versus dilutional) on chronic anemia of likely chronic disease -Iron deficiency notable on labs, cannot rule out malnutrition or anemia of chronic disease concurrently.  -Decrease lab draws to every 72 hours in hopes to avoid bloodletting the patient -Status post 1 unit PRBC (7/16)  Thrombocytopenia (HCC) - Likely due to acute illness/sepsis; cannot rule out other etiologies yet - Trend CBC with infection treatment; normalized  Hyponatremia - Replete and recheck as needed -Normalized  Elevated LFTs - Possibly due to current infection/sepsis vs risk for hepatitis from IV drug use - Hepatitis panel noted for hep C Ab positive, hep C quantitative at 30, Low/undetectable -  Liver ultrasound no acute abnormalities does show hepatomegaly on July 8 - LFTs normalized on July 19, however became elevated again on July 22, then normalized on 7/23, infectious disease who do not think this is from antibiotic,  UDS negative, monitor LFTs  Hypokalemia, resolved -Repleted  Malnutrition of moderate degree Per RD: "Moderate  Malnutritionrelated to social / environmental circumstancesas evidenced by energy intake < 75% for > or equal to 3 months, moderate fat depletion, moderate muscle depletion, percent weight loss."  Threatening behavior - on 7/11, patient endorsed multiple homicidal ideations to nurse and CNA in room; he threatened that he was going "to kill the attending" and all their family and kids; this was voiced several times despite nursing attempting to re-direct patient - psych eval requested - no evidence of imminent risk to self/others Per psychiatry evaluation: "Patient is not suicidal, homicidal or psychotic and does not need inpatient psychiatric care.  Crisis and safety planning done it in length by NP prior to recommending discharge"   AKI, Resolved - BUN now downtrending appropriately; creatinine stable  Pressure Injury 05/06/20 Coccyx Medial Stage 2 -  Partial thickness loss of dermis presenting as a shallow open injury with a red, pink wound bed without slough. (Active)  05/06/20 0600  Location: Coccyx  Location Orientation: Medial  Staging: Stage 2 -  Partial thickness loss of dermis presenting as a shallow open injury with a red, pink wound bed without slough.  Wound Description (Comments):   Present on Admission:   unable to determine if POA due to documentation  DVT prophylaxis: enoxaparin (LOVENOX) injection 40 mg Start: 05/11/20 0800 SCD's Start: 05/03/20 1058   Code Status: Full Family Communication: Mother at bedside with permission on 7/21 and on 7/23 Disposition:   Status is: Inpatient   Dispo: The patient is from: Home              Anticipated d/c is to: To be determined              Anticipated d/c date is: Not ready to discharge, getting IV antibiotics, need thoracic surgery and ID clearance                Consultants:   Thoracic surgery  Orthopedic surgery Dr. Eulah Pont  Infectious disease  Psychiatry on July 12  Cardiology for  TEE  Procedures:  BILATERAL IRRIGATION AND DEBRIDEMENT HANDS on 7/13 by Dr. Eulah Pont TEE 7/16 Debridement of right atrial mass and  Debridement of tricuspid valve vegetation by thoracic surgery Dr. Cliffton Asters on July 20 PICC line placement on July 22  Antimicrobials:   Ancef from July 15 to 7/22 Vanc 04/28/20 >> 7/15, vanc from 7/22 Cefepime 04/28/20 - 04/29/20 Flagyl 04/28/20 - 04/29/20    Objective: Vitals:   05/13/20 0400 05/13/20 0430 05/13/20 0457 05/13/20 0745  BP: (!) 133/103 (!) 124/86  (!) 129/84  Pulse: (!) 138  (!) 127   Resp: (!) 26 19  (!) 33  Temp:   98.6 F (37 C) 98.9 F (37.2 C)  TempSrc:    Oral  SpO2: 99%     Weight:      Height:        Intake/Output Summary (Last 24 hours) at 05/13/2020 0811 Last data filed at 05/12/2020 2312 Gross per 24 hour  Intake 311.06 ml  Output 1050 ml  Net -738.94 ml   Filed Weights   04/28/20 2130  Weight: 55 kg    Examination:  General exam: Drowsy but interactive, oriented x3 Respiratory system: Tachypneic Cardiovascular system: Sinus tachycardia Gastrointestinal system: Abdomen is nondistended, soft and nontender. No organomegaly or masses felt. Normal bowel sounds heard. Central nervous system: Drowsy but oriented. No focal neurological deficits. Extremities: Bilateral hand dressing intact, left hand edema has resolved, right hand edema has improved  Skin: No rashes, lesions or ulcers Psychiatry: Judgement and insight appear normal. Mood & affect appropriate.     Data Reviewed: I have personally reviewed following labs and imaging studies  CBC: Recent Labs  Lab 05/06/20 1107 05/09/20 0307 05/10/20 0802 05/10/20 0926 05/12/20 0500  WBC  --  14.7*  --   --  13.3*  HGB 8.8* 7.2* 6.5* 8.2* 8.4*  HCT 26.0* 22.8* 19.0* 24.0* 25.6*  MCV  --  88.4  --   --  89.2  PLT  --  178  --   --  145*    Basic Metabolic Panel: Recent Labs  Lab 05/06/20 1107 05/09/20 0307 05/10/20 0802 05/10/20 0926 05/12/20 0500  NA 138  136 138 138 133*  K 4.6 4.3 4.2 4.3 4.2  CL 102 102  --   --  101  CO2  --  25  --   --  25  GLUCOSE 90 116*  --   --  107*  BUN 15 10  --   --  10  CREATININE 0.60* 0.73  --   --  0.74  CALCIUM  --  7.7*  --   --  7.4*  MG  --   --   --   --  1.8    GFR: Estimated Creatinine Clearance: 106 mL/min (by C-G formula based on SCr of 0.74 mg/dL).  Liver Function Tests: Recent Labs  Lab 05/09/20 0307 05/12/20 0500  AST 25 105*  ALT 16 83*  ALKPHOS 69 124  BILITOT 0.5 0.6  PROT 5.6* 5.8*  ALBUMIN 1.4* 1.5*    CBG: No results for input(s): GLUCAP in the last 168 hours.   Recent Results (from the past 240 hour(s))  Fungus Culture With Stain     Status: None (Preliminary result)   Collection Time: 05/03/20  9:35 AM   Specimen: Soft Tissue, Other  Result Value Ref Range Status   Fungus Stain Final report  Final    Comment: (NOTE) Performed At: [facility] [address] [place], [place] [id number] Jolene Schimke MD BO:[id number]    Fungus (Mycology) Culture PENDING  Incomplete   Fungal Source TISSUE  Final    Comment: RT WRIST Performed at [facility], [address], [place], [place] [address]   Aerobic/Anaerobic Culture (surgical/deep wound)     Status: None   Collection Time: 05/03/20  9:35 AM   Specimen: Soft Tissue, Other  Result Value Ref Range Status   Specimen Description   Final    TISSUE RT WRIST Performed at [facility], [address], [place], [place] [address]    Special Requests   Final    PODIATRY [place] Performed at [facility], [address] [place], [place] [address]    Gram Stain   Final    RARE WBC PRESENT,BOTH PMN AND MONONUCLEAR RARE GRAM POSITIVE COCCI IN PAIRS IN CLUSTERS    Culture   Final    RARE STAPHYLOCOCCUS AUREUS NO ANAEROBES ISOLATED Performed at [facility], [address], [place], [place] [address]    Report Status 05/08/2020 FINAL  Final    Organism ID, Bacteria STAPHYLOCOCCUS AUREUS  Final      Susceptibility   Staphylococcus aureus - MIC*    CIPROFLOXACIN <=0.5 SENSITIVE Sensitive     ERYTHROMYCIN >=8 RESISTANT Resistant     GENTAMICIN <=0.5 SENSITIVE Sensitive     OXACILLIN 0.5 SENSITIVE Sensitive     TETRACYCLINE <=1 SENSITIVE Sensitive     VANCOMYCIN <=0.5 SENSITIVE Sensitive     TRIMETH/SULFA <=10 SENSITIVE Sensitive     CLINDAMYCIN <=0.25 SENSITIVE Sensitive     RIFAMPIN <=0.5 SENSITIVE Sensitive     Inducible Clindamycin NEGATIVE Sensitive     * RARE STAPHYLOCOCCUS AUREUS  Fungus Culture Result     Status: None   Collection Time: 05/03/20  9:35 AM  Result Value Ref Range Status   Result 1 Comment  Final    Comment: (NOTE) KOH/Calcofluor preparation:  no fungus observed. Performed At: [facility] [address] [place], [place] [id number] Jolene Schimke MD OI:[id number]   Fungus Culture With Stain     Status: None (Preliminary result)   Collection Time: 05/03/20  9:38 AM   Specimen: Soft Tissue, Other  Result Value Ref Range Status   Fungus Stain Final report  Final    Comment: (NOTE) Performed At: [facility] [address] [place], [place] [id number] Jolene Schimke MD JQ:[id number]    Fungus (Mycology) Culture PENDING  Incomplete   Fungal Source TISSUE  Final    Comment: LT HAND Performed at [facility], [address], [place], [place] [address]   Aerobic/Anaerobic Culture (surgical/deep wound)     Status: None   Collection  Time: 05/03/20  9:38 AM   Specimen: Soft Tissue, Other  Result Value Ref Range Status   Specimen Description   Final    TISSUE LT HAND Performed at [facility], [address], [place], [place] [address]    Special Requests   Final    PATIENT ON FOLLOWING ANCEF, [facility] Performed at [facility], [address] [place], [place] [address]    Gram Stain   Final    NO WBC SEEN RARE GRAM POSITIVE  COCCI IN PAIRS IN CLUSTERS    Culture   Final    FEW STAPHYLOCOCCUS AUREUS NO ANAEROBES ISOLATED Performed at [facility], [address], [place], [place] [address]    Report Status 05/08/2020 FINAL  Final   Organism ID, Bacteria STAPHYLOCOCCUS AUREUS  Final      Susceptibility   Staphylococcus aureus - MIC*    CIPROFLOXACIN <=0.5 SENSITIVE Sensitive     ERYTHROMYCIN >=8 RESISTANT Resistant     GENTAMICIN <=0.5 SENSITIVE Sensitive     OXACILLIN 0.5 SENSITIVE Sensitive     TETRACYCLINE <=1 SENSITIVE Sensitive     VANCOMYCIN 1 SENSITIVE Sensitive     TRIMETH/SULFA <=10 SENSITIVE Sensitive     CLINDAMYCIN <=0.25 SENSITIVE Sensitive     RIFAMPIN <=0.5 SENSITIVE Sensitive     Inducible Clindamycin NEGATIVE Sensitive     * FEW STAPHYLOCOCCUS AUREUS  Fungus Culture Result     Status: None   Collection Time: 05/03/20  9:38 AM  Result Value Ref Range Status   Result 1 Comment  Final    Comment: (NOTE) KOH/Calcofluor preparation:  no fungus observed. Performed At: [facility] [address] [place], [place] [id number] Jolene Schimke MD QV:[id number]   Culture, blood (Routine X 2) w Reflex to ID Panel     Status: None   Collection Time: 05/04/20  5:19 PM   Specimen: BLOOD  Result Value Ref Range Status   Specimen Description   Final    BLOOD RIGHT ANTECUBITAL Performed at [facility], [address] [place], [place] [address]    Special Requests   Final    BOTTLES DRAWN AEROBIC AND ANAEROBIC Blood Culture adequate volume Performed at [facility], [address] [place], [place] [address]    Culture   Final    NO GROWTH 5 DAYS Performed at [facility], [address], [place], [place] [address]    Report Status 05/09/2020 FINAL  Final  Culture, blood (Routine X 2) w Reflex to ID Panel     Status: None   Collection Time: 05/04/20  5:19 PM   Specimen: BLOOD  Result Value Ref Range Status   Specimen Description    Final    BLOOD LEFT ANTECUBITAL Performed at [facility], [address] [place], [place] [address]    Special Requests   Final    BOTTLES DRAWN AEROBIC ONLY Blood Culture adequate volume Performed at [facility], [address] [place], [place] [address]    Culture   Final    NO GROWTH 5 DAYS Performed at [facility], [address] [place], [place] [address]    Report Status 05/09/2020 FINAL  Final  Fungus Culture With Stain     Status: None (Preliminary result)   Collection Time: 05/10/20  9:10 AM   Specimen: Soft Tissue, Other  Result Value Ref Range Status   Fungus Stain Final report  Final  Comment: (NOTE) Performed At: [facility] [address] [place], [place] [id number] Jolene Schimke MD [id number]    Fungus (Mycology) Culture PENDING  Incomplete   Fungal Source TRICUSPID VALVE VEGETATION  Final    Comment: Performed at [facility], [address], [place], [place] [address]  Aerobic/Anaerobic Culture (surgical/deep wound)     Status: None (Preliminary result)   Collection Time: 05/10/20  9:10 AM   Specimen: Soft Tissue, Other  Result Value Ref Range Status   Specimen Description TISSUE  Final   Special Requests TRICUSPID VALVE VEGETATION  Final   Gram Stain   Final    RARE WBC PRESENT, PREDOMINANTLY PMN ABUNDANT GRAM POSITIVE COCCI IN PAIRS IN CLUSTERS Performed at [facility], [address], [place], [place] [address]    Culture   Final    ABUNDANT METHICILLIN RESISTANT STAPHYLOCOCCUS AUREUS NO ANAEROBES ISOLATED; CULTURE IN PROGRESS FOR 5 DAYS    Report Status PENDING  Incomplete   Organism ID, Bacteria METHICILLIN RESISTANT STAPHYLOCOCCUS AUREUS  Final      Susceptibility   Methicillin resistant staphylococcus aureus - MIC*    CIPROFLOXACIN <=0.5 SENSITIVE Sensitive     ERYTHROMYCIN >=8 RESISTANT Resistant     GENTAMICIN <=0.5 SENSITIVE Sensitive     OXACILLIN >=4 RESISTANT  Resistant     TETRACYCLINE <=1 SENSITIVE Sensitive     VANCOMYCIN 1 SENSITIVE Sensitive     TRIMETH/SULFA <=10 SENSITIVE Sensitive     CLINDAMYCIN <=0.25 SENSITIVE Sensitive     RIFAMPIN <=0.5 SENSITIVE Sensitive     Inducible Clindamycin NEGATIVE Sensitive     * ABUNDANT METHICILLIN RESISTANT STAPHYLOCOCCUS AUREUS  Acid Fast Smear (AFB)     Status: None   Collection Time: 05/10/20  9:10 AM   Specimen: Soft Tissue, Other  Result Value Ref Range Status   AFB Specimen Processing Concentration  Final   Acid Fast Smear Negative  Final    Comment: (NOTE) Performed At: [facility] [address] [place], [place] [id number] Jolene Schimke MD NU:[id number]    Source (AFB) TRICUSPID VALVE VEGETATION  Final    Comment: Performed at [facility], [address], [place], [place] [address]  Fungus Culture Result     Status: None   Collection Time: 05/10/20  9:10 AM  Result Value Ref Range Status   Result 1 Comment  Final    Comment: (NOTE) KOH/Calcofluor preparation:  no fungus observed. Performed At: [facility] [address] [place], [place] [id number] Jolene Schimke MD EP:[id number]          Radiology Studies: VAS [place] UPPER EXTREMITY VENOUS DUPLEX  Result Date: 05/11/2020 UPPER VENOUS STUDY  Indications: Pain Limitations: Line and bandages. Comparison Study: no prior Performing Technologist: Blanch Media RVS  Examination Guidelines: A complete evaluation includes B-mode imaging, spectral Doppler, color Doppler, and power Doppler as needed of all accessible portions of each vessel. Bilateral testing is considered an integral part of a complete examination. Limited examinations for reoccurring indications may be performed as noted.  Left Findings: +----------+------------+---------+-----------+----------+--------------+ LEFT      CompressiblePhasicitySpontaneousProperties   Summary      +----------+------------+---------+-----------+----------+--------------+ IJV                                                 Not visualized +----------+------------+---------+-----------+----------+--------------+ Subclavian    Full  Yes       Yes                             +----------+------------+---------+-----------+----------+--------------+ Axillary      Full       Yes       Yes                             +----------+------------+---------+-----------+----------+--------------+ Brachial      Full       Yes       Yes                             +----------+------------+---------+-----------+----------+--------------+ Radial        Full                                                 +----------+------------+---------+-----------+----------+--------------+ Ulnar         Full                                                 +----------+------------+---------+-----------+----------+--------------+ Cephalic      Full                                                 +----------+------------+---------+-----------+----------+--------------+ Basilic       Full                                                 +----------+------------+---------+-----------+----------+--------------+  Summary:  Left: No evidence of deep vein thrombosis in the upper extremity. No evidence of superficial vein thrombosis in the upper extremity.  *See table(s) above for measurements and observations.  Diagnosing physician: Fabienne Bruns MD Electronically signed by Fabienne Bruns MD on 05/11/2020 at 6:10:59 PM.    Final    [place] EKG SITE RITE  Result Date: 05/12/2020 If Site Rite image not attached, placement could not be confirmed due to current cardiac rhythm.       Scheduled Meds: . Chlorhexidine Gluconate Cloth  6 each Topical Q0600  . docusate sodium  100 mg Oral BID  . enoxaparin (LOVENOX) injection  40 mg Subcutaneous Q24H  . mouth rinse  15 mL Mouth Rinse BID  .  metoprolol tartrate  12.5 mg Oral BID  . multivitamin with minerals  1 tablet Oral Daily  . pantoprazole  20 mg Oral Daily  . Ensure Max Protein  11 oz Oral Daily  . sodium chloride flush  10-40 mL Intracatheter Q12H  . sodium chloride flush  3 mL Intravenous Q12H   Continuous Infusions: . vancomycin 750 mg (05/12/20 2347)     LOS: 15 days   Total Critical Care time in examining the patient bedside, evaluating Lab work and other data, over half of the total time was spent in coordinating patient care on the floor  or bedisde in talking to patient/family members, communicating with nursing Staff on the floor and sub specialists infectious disease, pulmonary critical care to coordinate patients medical care and needs is 70 Minutes.  The condition which has caused critical injury/acute impairment of  vital organ system with a high probability of sudden clinically significant deterioration and can cause Potential Life threatening injury to this patient addressed today is impending respiratory failure    I have personally reviewed and interpreted on  05/13/2020 daily labs, tele strips, imagings as discussed above under date review session and assessment and plans.  I reviewed all nursing notes, pharmacy notes, consultant notes,  vitals, pertinent old records  I have discussed plan of care as described above with RN , patient and mother at bedside  on 05/13/2020  Voice Recognition /Dragon dictation system was used to create this note, attempts have been made to correct errors. Please contact the author with questions and/or clarifications.   Albertine Grates, MD PhD FACP [facility]  Available via Epic secure chat 7am-7pm for nonurgent issues Please page for urgent issues To page the attending provider between 7A-7P or the covering provider during after hours 7P-7A, please log into the web site [email or web address] and access using universal [place] password for that web site. If you do not have  the password, please call the hospital operator.    05/13/2020, 8:11 AM

## 2020-05-13 NOTE — Procedures (Signed)
Intubation Procedure Note  Clifford Clark  [id number]  1991/10/01  Date:05/13/20  Time:5:46 PM   Provider Performing:Stpehen Petitjean    Procedure: Intubation (31500)  Indication(s) Respiratory Failure  Consent Risks of the procedure as well as the alternatives and risks of each were explained to the patient and/or caregiver.  Consent for the procedure was obtained and is signed in the bedside chart   Anesthesia Etomidate and Rocuronium   Time Out Verified patient identification, verified procedure, site/side was marked, verified correct patient position, special equipment/implants available, medications/allergies/relevant history reviewed, required imaging and test results available.   Sterile Technique Usual hand hygeine, masks, and gloves were used   Procedure Description Patient positioned in bed supine.  Sedation given as noted above.  Patient was intubated with endotracheal tube using Glidescope.  View was Grade 1 full glottis .  Number of attempts was 1.  Colorimetric CO2 detector was consistent with tracheal placement.   Complications/Tolerance None; patient tolerated the procedure well. Chest X-ray is ordered to verify placement.   EBL Minimal   Specimen(s) None

## 2020-05-13 NOTE — Progress Notes (Signed)
Paged dr Roda Shutters, regarding return from CT scan, pt struggling more HR 140's resp 50's. Will consult CCM.

## 2020-05-13 NOTE — Progress Notes (Signed)
Pharmacy Antibiotic Note  Clifford Clark is a 29 y.o. male admitted on 04/28/2020 with sepsis.  Pharmacy has been consulted for Meropenem dosing. CrCL > 100 ml/min  Pt has a history of IVDU, admitted with sepsis and endocarditis. Blood and wrist cultures have grown MSSA, and the valve vegetation culture is showing two different S.aureus morphologies - one is MSSA and one is MRSA per discussion with the lab.  Coverage being broadened due to clinical decline.  Plan: Meropenem 1 gram IV q8hr Continur Vancomycin 750 IV q8hr Monitor renal function, clinical status and C&S.  Height: 5\' 7"  (170.2 cm) Weight: 55 kg (121 lb 4.1 oz) IBW/kg (Calculated) : 66.1  Temp (24hrs), Avg:99.1 F (37.3 C), Min:97.7 F (36.5 C), Max:103.1 F (39.5 C)  Recent Labs  Lab 05/09/20 0307 05/12/20 0500 05/13/20 0839  WBC 14.7* 13.3* 17.8*  CREATININE 0.73 0.74 0.70    Estimated Creatinine Clearance: 106 mL/min (by C-G formula based on SCr of 0.7 mg/dL).    No Known Allergies  Antimicrobials this admission: cefepime 7/8 >> 7/9 vancomycin 7/8 >> 7/15, resumed 7/22>> Metronidazole 7/8 >> 7/9  Cefazolin 7/16>>7/22 Meropenem 7/23>>  Microbiology results: 7/8 BCx: BCID resulted 4/4 MRSA, BCx resulting as MSSA 7/8 UCx:  >100,000 colonies MSSA  7/8 MRSA PCR: positive  7/8 COVID-19 negative  7/8 HIV:  negative  7/8 hepatitis panel: hep C Ab positive 7/9: HCV RNA quant: not elevated  7/10 rpt BCx: NGTD  7/13 R WristCx & FungalCx: rare MSSA 7/13 L WristCx & FungalCx: few MSSA 7/14: Bcx NG 7/20 valve tissue:  MSSA and MRSA per lab report  Thank you for allowing pharmacy to be a part of this patient's care.  8/20, PharmD, [facility] Clinical Pharmacist Please see AMION for all Pharmacists' Contact Phone Numbers 05/13/2020, 5:18 PM

## 2020-05-13 NOTE — Progress Notes (Signed)
Subjective: "Exhausted" Mom at the bedside - feels that his breathing is worse today and she is worried about him.    Antibiotics:  Anti-infectives (From admission, onward)   Start     Dose/Rate Route Frequency Ordered Stop   05/12/20 1600  vancomycin (VANCOREADY) IVPB 750 mg/150 mL     Discontinue     750 mg 150 mL/hr over 60 Minutes Intravenous Every 8 hours 05/12/20 1509     05/05/20 1600  ceFAZolin (ANCEF) IVPB 2g/100 mL premix  Status:  Discontinued        2 g 200 mL/hr over 30 Minutes Intravenous Every 8 hours 05/05/20 1452 05/12/20 1509   05/04/20 1200  vancomycin (VANCOCIN) IVPB 1000 mg/200 mL premix  Status:  Discontinued        1,000 mg 200 mL/hr over 60 Minutes Intravenous Every 12 hours 05/04/20 1045 05/04/20 1055   05/04/20 1200  vancomycin (VANCOREADY) IVPB 750 mg/150 mL  Status:  Discontinued        750 mg 150 mL/hr over 60 Minutes Intravenous Every 8 hours 05/04/20 1055 05/05/20 1452   05/03/20 0945  vancomycin (VANCOCIN) powder  Status:  Discontinued          As needed 05/03/20 0945 05/03/20 1057   05/03/20 0920  ceFAZolin (ANCEF) 2-4 GM/100ML-% IVPB       Note to Pharmacy: Viviano Simas   : cabinet override      05/03/20 0920 05/03/20 2129   05/02/20 1500  vancomycin (VANCOREADY) IVPB 750 mg/150 mL  Status:  Discontinued        750 mg 150 mL/hr over 60 Minutes Intravenous Every 12 hours 05/02/20 1425 05/04/20 1045   05/01/20 1330  vancomycin variable dose per unstable renal function (pharmacist dosing)  Status:  Discontinued         Does not apply See admin instructions 05/01/20 1330 05/03/20 0810   04/29/20 1000  vancomycin (VANCOREADY) IVPB 750 mg/150 mL  Status:  Discontinued        750 mg 150 mL/hr over 60 Minutes Intravenous Every 8 hours 04/29/20 0834 05/01/20 1329   04/29/20 0400  metroNIDAZOLE (FLAGYL) IVPB 500 mg  Status:  Discontinued        500 mg 100 mL/hr over 60 Minutes Intravenous Every 8 hours 04/28/20 2122 04/29/20 0819    04/29/20 0400  ceFEPIme (MAXIPIME) 2 g in sodium chloride 0.9 % 100 mL IVPB  Status:  Discontinued        2 g 200 mL/hr over 30 Minutes Intravenous Every 8 hours 04/28/20 2138 04/29/20 0819   04/29/20 0100  vancomycin (VANCOCIN) IVPB 1000 mg/200 mL premix  Status:  Discontinued        1,000 mg 200 mL/hr over 60 Minutes Intravenous Every 8 hours 04/28/20 2146 04/29/20 0833   04/28/20 1715  ceFEPIme (MAXIPIME) 2 g in sodium chloride 0.9 % 100 mL IVPB        2 g 200 mL/hr over 30 Minutes Intravenous  Once 04/28/20 1708 04/28/20 1903   04/28/20 1715  metroNIDAZOLE (FLAGYL) IVPB 500 mg        500 mg 100 mL/hr over 60 Minutes Intravenous  Once 04/28/20 1708 04/28/20 2111   04/28/20 1715  vancomycin (VANCOCIN) IVPB 1000 mg/200 mL premix        1,000 mg 200 mL/hr over 60 Minutes Intravenous  Once 04/28/20 1708 04/28/20 1947      Medications: Scheduled Meds: . Chlorhexidine Gluconate Cloth  6  each Topical Q0600  . docusate sodium  100 mg Oral BID  . enoxaparin (LOVENOX) injection  40 mg Subcutaneous Q24H  . mouth rinse  15 mL Mouth Rinse BID  . metoprolol tartrate  12.5 mg Oral BID  . multivitamin with minerals  1 tablet Oral Daily  . pantoprazole  20 mg Oral Daily  . Ensure Max Protein  11 oz Oral Daily  . sodium chloride flush  10-40 mL Intracatheter Q12H  . sodium chloride flush  3 mL Intravenous Q12H   Continuous Infusions: . vancomycin 750 mg (05/13/20 0845)   PRN Meds:.acetaminophen **OR** acetaminophen, ipratropium-albuterol, LORazepam, metoprolol tartrate, morphine injection, ondansetron **OR** ondansetron (ZOFRAN) IV, oxyCODONE, sodium chloride flush    Objective: Weight change:   Intake/Output Summary (Last 24 hours) at 05/13/2020 1446 Last data filed at 05/12/2020 2312 Gross per 24 hour  Intake 311.06 ml  Output 1050 ml  Net -738.94 ml   Blood pressure 123/85, pulse (!) 120, temperature 99.4 F (37.4 C), temperature source Axillary, resp. rate (!) 40, height   (1.702 m), weight 55 kg, SpO2 99 %. Temp:  [97.7 F (36.5 C)-99.4 F (37.4 C)] 99.4 F (37.4 C) (07/23 1155) Pulse Rate:  [40-138] 120 (07/23 1200) Resp:  [19-40] 40 (07/23 1400) BP: (106-133)/(69-103) 123/85 (07/23 1400) SpO2:  [86 %-100 %] 99 % (07/23 1400)   Physical Exam: General: more lethargic, less conversational, chronically ill-appearing. HEENT: anicteric sclera, EOMI, central line his neck CVS tachycardic, no murmurs/rubs Chest: tachypneic, shallow respiratory rate  Abdomen: soft non-distended,  Extremities: some generalized swelling in the lower extremities No deformity noted bilaterally Skin: no rashes Neuro: non-focal   CBC: Lab Results  Component Value Date   WBC 17.8 (H) 05/13/2020   HGB 8.1 (L) 05/13/2020   HCT 24.5 (L) 05/13/2020   MCV 88.8 05/13/2020   PLT 124 (L) 05/13/2020     BMET Recent Labs    05/12/20 0500 05/13/20 0839  NA 133* 132*  K 4.2 4.1  CL 101 97*  CO2 25 27  GLUCOSE 107* 117*  BUN 10 8  CREATININE 0.74 0.70  CALCIUM 7.4* 7.5*     Liver Panel  Recent Labs    05/12/20 0500 05/13/20 0839  PROT 5.8* 6.2*  ALBUMIN 1.5* 1.4*  AST 105* 37  ALT 83* 40  ALKPHOS 124 110  BILITOT 0.6 0.6       Sedimentation Rate No results for input(s): ESRSEDRATE in the last 72 hours. C-Reactive Protein No results for input(s): CRP in the last 72 hours.  Micro Results: Recent Results (from the past 720 hour(s))  Culture, blood (routine x 2)     Status: Abnormal   Collection Time: 04/28/20  3:45 PM   Specimen: BLOOD  Result Value Ref Range Status   Specimen Description   Final    BLOOD LEFT ANTECUBITAL Performed at [facility], [address] [place], [place] [address]    Special Requests   Final    BOTTLES DRAWN AEROBIC AND ANAEROBIC Blood Culture adequate volume Performed at [facility], [address] [place], [place] [address]    Culture  Setup Time   Final    GRAM POSITIVE  COCCI IN BOTH AEROBIC AND ANAEROBIC BOTTLES CRITICAL RESULT CALLED TO, READ BACK BY AND VERIFIED WITH: PHARMD M. Derrick Ravel [phone] FCP    Culture (A)  Final    STAPHYLOCOCCUS AUREUS METHICILLIN RESISTANT ORGANISM WAS NOT RECOVERED IN CULTURE. REPEATED FOR CONFIRMATION Performed at [facility], [address], [place], [place] [address]    Report Status 05/03/2020 FINAL  Final   Organism ID, Bacteria STAPHYLOCOCCUS AUREUS  Final      Susceptibility   Staphylococcus aureus - MIC*    CIPROFLOXACIN <=0.5 SENSITIVE Sensitive     ERYTHROMYCIN >=8 RESISTANT Resistant     GENTAMICIN <=0.5 SENSITIVE Sensitive     OXACILLIN 0.5 SENSITIVE Sensitive     TETRACYCLINE <=1 SENSITIVE Sensitive     VANCOMYCIN <=0.5 SENSITIVE Sensitive     TRIMETH/SULFA <=10 SENSITIVE Sensitive     CLINDAMYCIN <=0.25 SENSITIVE Sensitive     RIFAMPIN <=0.5 SENSITIVE Sensitive     Inducible Clindamycin NEGATIVE Sensitive     * STAPHYLOCOCCUS AUREUS  Blood Culture ID Panel (Reflexed)     Status: Abnormal   Collection Time: 04/28/20  3:45 PM  Result Value Ref Range Status   Enterococcus species NOT DETECTED NOT DETECTED Final   Listeria monocytogenes NOT DETECTED NOT DETECTED Final   Staphylococcus species DETECTED (A) NOT DETECTED Final    Comment: CRITICAL RESULT CALLED TO, READ BACK BY AND VERIFIED WITH: PHARMD Lynder Parents [phone] FCP    Staphylococcus aureus (BCID) DETECTED (A) NOT DETECTED Final    Comment: Methicillin (oxacillin)-resistant Staphylococcus aureus (MRSA). MRSA is predictably resistant to beta-lactam antibiotics (except ceftaroline). Preferred therapy is vancomycin unless clinically contraindicated. Patient requires contact precautions if  hospitalized. CRITICAL RESULT CALLED TO, READ BACK BY AND VERIFIED WITH: PHARMD M. Derrick Ravel [phone] FCP    Methicillin resistance DETECTED (A) NOT DETECTED Final    Comment: CRITICAL RESULT CALLED TO, READ BACK BY AND VERIFIED WITH: PHARMD M. RENZ [id number]  0800 FCP    Streptococcus species NOT DETECTED NOT DETECTED Final   Streptococcus agalactiae NOT DETECTED NOT DETECTED Final   Streptococcus pneumoniae NOT DETECTED NOT DETECTED Final   Streptococcus pyogenes NOT DETECTED NOT DETECTED Final   Acinetobacter baumannii NOT DETECTED NOT DETECTED Final   Enterobacteriaceae species NOT DETECTED NOT DETECTED Final   Enterobacter cloacae complex NOT DETECTED NOT DETECTED Final   Escherichia coli NOT DETECTED NOT DETECTED Final   Klebsiella oxytoca NOT DETECTED NOT DETECTED Final   Klebsiella pneumoniae NOT DETECTED NOT DETECTED Final   Proteus species NOT DETECTED NOT DETECTED Final   Serratia marcescens NOT DETECTED NOT DETECTED Final   Haemophilus influenzae NOT DETECTED NOT DETECTED Final   Neisseria meningitidis NOT DETECTED NOT DETECTED Final   Pseudomonas aeruginosa NOT DETECTED NOT DETECTED Final   Candida albicans NOT DETECTED NOT DETECTED Final   Candida glabrata NOT DETECTED NOT DETECTED Final   Candida krusei NOT DETECTED NOT DETECTED Final   Candida parapsilosis NOT DETECTED NOT DETECTED Final   Candida tropicalis NOT DETECTED NOT DETECTED Final    Comment: Performed at [facility], [address] [place], [place] [address]  Culture, blood (routine x 2)     Status: Abnormal   Collection Time: 04/28/20  4:13 PM   Specimen: BLOOD  Result Value Ref Range Status   Specimen Description   Final    BLOOD RIGHT ANTECUBITAL Performed at [facility], [address], [place], [place] [address]    Special Requests   Final    BOTTLES DRAWN AEROBIC AND ANAEROBIC Blood Culture adequate volume Performed at [facility], [address] [place], [place] [address]    Culture  Setup Time   Final    GRAM POSITIVE COCCI IN BOTH AEROBIC AND ANAEROBIC BOTTLES CRITICAL  VALUE NOTED.  VALUE IS CONSISTENT WITH PREVIOUSLY REPORTED AND CALLED VALUE.    Culture (A)  Final    STAPHYLOCOCCUS  AUREUS METHICILLIN RESISTANT ORGANISM WAS NOT RECOVERED IN CULTURE. REPEATED FOR CONFIRMATION Performed at [facility], [address] [place], [place] [address]    Report Status 05/03/2020 FINAL  Final   Organism ID, Bacteria STAPHYLOCOCCUS AUREUS  Final      Susceptibility   Staphylococcus aureus - MIC*    CIPROFLOXACIN <=0.5 SENSITIVE Sensitive     ERYTHROMYCIN >=8 RESISTANT Resistant     GENTAMICIN <=0.5 SENSITIVE Sensitive     OXACILLIN 0.5 SENSITIVE Sensitive     TETRACYCLINE <=1 SENSITIVE Sensitive     VANCOMYCIN <=0.5 SENSITIVE Sensitive     TRIMETH/SULFA <=10 SENSITIVE Sensitive     CLINDAMYCIN <=0.25 SENSITIVE Sensitive     RIFAMPIN <=0.5 SENSITIVE Sensitive     Inducible Clindamycin NEGATIVE Sensitive     * STAPHYLOCOCCUS AUREUS  Urine culture     Status: Abnormal   Collection Time: 04/28/20  7:00 PM   Specimen: In/Out Cath Urine  Result Value Ref Range Status   Specimen Description   Final    IN/OUT CATH URINE Performed at [facility], [address], [place], [place] [address]    Special Requests   Final    NONE Performed at [facility], [address], [place], [place] [address]    Culture >=100,000 COLONIES/mL STAPHYLOCOCCUS AUREUS (A)  Final   Report Status 05/01/2020 FINAL  Final   Organism ID, Bacteria STAPHYLOCOCCUS AUREUS (A)  Final      Susceptibility   Staphylococcus aureus - MIC*    CIPROFLOXACIN <=0.5 SENSITIVE Sensitive     GENTAMICIN <=0.5 SENSITIVE Sensitive     NITROFURANTOIN <=16 SENSITIVE Sensitive     OXACILLIN 0.5 SENSITIVE Sensitive     TETRACYCLINE <=1 SENSITIVE Sensitive     VANCOMYCIN <=0.5 SENSITIVE Sensitive     TRIMETH/SULFA <=10 SENSITIVE Sensitive     CLINDAMYCIN <=0.25 SENSITIVE Sensitive     RIFAMPIN <=0.5 SENSITIVE Sensitive     Inducible Clindamycin NEGATIVE Sensitive     * >=100,000 COLONIES/mL STAPHYLOCOCCUS AUREUS  SARS Coronavirus 2 by RT PCR (hospital order, performed in  [facility] hospital lab) Nasopharyngeal Nasopharyngeal Swab     Status: None   Collection Time: 04/28/20  7:03 PM   Specimen: Nasopharyngeal Swab  Result Value Ref Range Status   SARS Coronavirus 2 NEGATIVE NEGATIVE Final    Comment: (NOTE) SARS-CoV-2 target nucleic acids are NOT DETECTED.  The SARS-CoV-2 RNA is generally detectable in upper and lower respiratory specimens during the acute phase of infection. The lowest concentration of SARS-CoV-2 viral copies this assay can detect is 250 copies / mL. A negative result does not preclude SARS-CoV-2 infection and should not be used as the sole basis for treatment or other patient management decisions.  A negative result may occur with improper specimen collection / handling, submission of specimen other than nasopharyngeal swab, presence of viral mutation(s) within the areas targeted by this assay, and inadequate number of viral copies (<250 copies / mL). A negative result must be combined with clinical observations, patient history, and epidemiological information.  Fact Sheet for Patients:   BoilerBrush.com.cy  Fact Sheet for Healthcare Providers: https://pope.com/  This test is not yet approved or  cleared by the [place] FDA and has been authorized for detection and/or diagnosis of SARS-CoV-2 by FDA under an Emergency Use Authorization (EUA).  This EUA will remain in effect (meaning this test can be used) for the duration of the COVID-19 declaration under Section 564(b)(1) of the Act, 21 U.S.C. section 360bbb-3(b)(1), unless the authorization is terminated or revoked sooner.  Performed at [facility], [address], [place], [place] [address]   MRSA PCR Screening     Status: Abnormal   Collection Time: 04/28/20  7:45 PM   Specimen: Nasal Mucosa; Nasopharyngeal  Result Value Ref Range Status   MRSA by PCR POSITIVE (A) NEGATIVE Final    Comment:         The GeneXpert MRSA Assay (FDA approved for NASAL specimens only), is one component of a comprehensive MRSA colonization surveillance program. It is not intended to diagnose MRSA infection nor to guide or monitor treatment for MRSA infections. RESULT CALLED TO, READ BACK BY AND VERIFIED WITH: S SMITH AT 2314 ON 04/28/2020 BY MOSLEY,J  Performed at [facility], [address] [place], [place] [address]   Culture, blood (Routine X 2) w Reflex to ID Panel     Status: None   Collection Time: 04/30/20  8:15 AM   Specimen: BLOOD  Result Value Ref Range Status   Specimen Description   Final    BLOOD LEFT ANTECUBITAL Performed at [facility], [address] [place], [place] [address]    Special Requests   Final    BOTTLES DRAWN AEROBIC AND ANAEROBIC Blood Culture adequate volume Performed at [facility], [address], [place], [place] [address]    Culture   Final    NO GROWTH 5 DAYS Performed at [facility], [address] [place], [place] [address]    Report Status 05/05/2020 FINAL  Final  Culture, blood (Routine X 2) w Reflex to ID Panel     Status: None   Collection Time: 04/30/20  8:15 AM   Specimen: BLOOD LEFT FOREARM  Result Value Ref Range Status   Specimen Description   Final    BLOOD LEFT FOREARM Performed at [facility], [address] [place], [place] [address]    Special Requests   Final    BOTTLES DRAWN AEROBIC ONLY Blood Culture adequate volume Performed at [facility], [address], [place], [place] [address]    Culture   Final    NO GROWTH 5 DAYS Performed at [facility], [address], [place], [place] [address]    Report Status 05/05/2020 FINAL  Final  Fungus Culture With Stain     Status: None (Preliminary result)   Collection Time: 05/03/20  9:35 AM   Specimen: Soft Tissue, Other  Result Value Ref Range Status   Fungus Stain Final report  Final     Comment: (NOTE) Performed At: [facility] [address] [place], [place] [id number] Jolene Schimke MD NU:[id number]    Fungus (Mycology) Culture PENDING  Incomplete   Fungal Source TISSUE  Final    Comment: RT WRIST Performed at [facility], [address], [place], [place] [address]   Aerobic/Anaerobic Culture (surgical/deep wound)     Status: None   Collection Time: 05/03/20  9:35 AM   Specimen: Soft Tissue, Other  Result Value Ref Range Status   Specimen Description   Final    TISSUE RT WRIST Performed at [facility], [address], [place], [place] [address]    Special Requests   Final    PODIATRY  ANCEF, VANC Performed at [facility], [address] [place], [place] [address]    Gram Stain   Final    RARE WBC PRESENT,BOTH PMN AND MONONUCLEAR RARE GRAM POSITIVE COCCI IN PAIRS IN CLUSTERS    Culture   Final    RARE STAPHYLOCOCCUS AUREUS NO ANAEROBES ISOLATED Performed at [facility], [address] [place], [place] [address]    Report Status 05/08/2020 FINAL  Final   Organism ID, Bacteria STAPHYLOCOCCUS AUREUS  Final      Susceptibility   Staphylococcus aureus - MIC*    CIPROFLOXACIN <=0.5 SENSITIVE Sensitive     ERYTHROMYCIN >=8 RESISTANT Resistant     GENTAMICIN <=0.5 SENSITIVE Sensitive     OXACILLIN 0.5 SENSITIVE Sensitive     TETRACYCLINE <=1 SENSITIVE Sensitive     VANCOMYCIN <=0.5 SENSITIVE Sensitive     TRIMETH/SULFA <=10 SENSITIVE Sensitive     CLINDAMYCIN <=0.25 SENSITIVE Sensitive     RIFAMPIN <=0.5 SENSITIVE Sensitive     Inducible Clindamycin NEGATIVE Sensitive     * RARE STAPHYLOCOCCUS AUREUS  Fungus Culture Result     Status: None   Collection Time: 05/03/20  9:35 AM  Result Value Ref Range Status   Result 1 Comment  Final    Comment: (NOTE) KOH/Calcofluor preparation:  no fungus observed. Performed At: [facility] [address] [place], [place]  [id number] Jolene Schimke MD LX:[id number]   Fungus Culture With Stain     Status: None (Preliminary result)   Collection Time: 05/03/20  9:38 AM   Specimen: Soft Tissue, Other  Result Value Ref Range Status   Fungus Stain Final report  Final    Comment: (NOTE) Performed At: [facility] [address] [place], [place] [id number] Jolene Schimke MD IW:[id number]    Fungus (Mycology) Culture PENDING  Incomplete   Fungal Source TISSUE  Final    Comment: LT HAND Performed at [facility], [address] [place], [place] [address]   Aerobic/Anaerobic Culture (surgical/deep wound)     Status: None   Collection Time: 05/03/20  9:38 AM   Specimen: Soft Tissue, Other  Result Value Ref Range Status   Specimen Description   Final    TISSUE LT HAND Performed at [facility], [address] [place], [place] [address]    Special Requests   Final    PATIENT ON FOLLOWING ANCEF, [facility] Performed at [facility], [address] [place], [place] [address]    Gram Stain   Final    NO WBC SEEN RARE GRAM POSITIVE COCCI IN PAIRS IN CLUSTERS    Culture   Final    FEW STAPHYLOCOCCUS AUREUS NO ANAEROBES ISOLATED Performed at [facility], [address] [place], [place] [address]    Report Status 05/08/2020 FINAL  Final   Organism ID, Bacteria STAPHYLOCOCCUS AUREUS  Final      Susceptibility   Staphylococcus aureus - MIC*    CIPROFLOXACIN <=0.5 SENSITIVE Sensitive     ERYTHROMYCIN >=8 RESISTANT Resistant     GENTAMICIN <=0.5 SENSITIVE Sensitive     OXACILLIN 0.5 SENSITIVE Sensitive     TETRACYCLINE <=1 SENSITIVE Sensitive     VANCOMYCIN 1 SENSITIVE Sensitive     TRIMETH/SULFA <=10 SENSITIVE Sensitive     CLINDAMYCIN <=0.25 SENSITIVE Sensitive     RIFAMPIN <=0.5 SENSITIVE Sensitive     Inducible Clindamycin NEGATIVE Sensitive     * FEW STAPHYLOCOCCUS AUREUS  Fungus Culture Result  Status: None   Collection Time:  05/03/20  9:38 AM  Result Value Ref Range Status   Result 1 Comment  Final    Comment: (NOTE) KOH/Calcofluor preparation:  no fungus observed. Performed At: [facility] [address] [place], [place] [id number] Jolene SchimkeNagendra Sanjai MD WU:[id number]:[phone]   Culture, blood (Routine X 2) w Reflex to ID Panel     Status: None   Collection Time: 05/04/20  5:19 PM   Specimen: BLOOD  Result Value Ref Range Status   Specimen Description   Final    BLOOD RIGHT ANTECUBITAL Performed at [facility], [address] [place], [place] [address]    Special Requests   Final    BOTTLES DRAWN AEROBIC AND ANAEROBIC Blood Culture adequate volume Performed at [facility], [address] [place], [place] [address]    Culture   Final    NO GROWTH 5 DAYS Performed at [facility], [address] [place], [place] [address]    Report Status 05/09/2020 FINAL  Final  Culture, blood (Routine X 2) w Reflex to ID Panel     Status: None   Collection Time: 05/04/20  5:19 PM   Specimen: BLOOD  Result Value Ref Range Status   Specimen Description   Final    BLOOD LEFT ANTECUBITAL Performed at [facility], [address] [place], [place] [address]    Special Requests   Final    BOTTLES DRAWN AEROBIC ONLY Blood Culture adequate volume Performed at [facility], [address] [place], [place] [address]    Culture   Final    NO GROWTH 5 DAYS Performed at [facility], [address] [place], [place] [address]    Report Status 05/09/2020 FINAL  Final  Fungus Culture With Stain     Status: None (Preliminary result)   Collection Time: 05/10/20  9:10 AM   Specimen: Soft Tissue, Other  Result Value Ref Range Status   Fungus Stain Final report  Final    Comment: (NOTE) Performed At: [facility] [address] [place], [place] [id number] Jolene SchimkeNagendra Sanjai MD IH:[id number]:[phone]    Fungus (Mycology) Culture PENDING   Incomplete   Fungal Source TRICUSPID VALVE VEGETATION  Final    Comment: Performed at [facility], [address] [place], [place] [address]  Aerobic/Anaerobic Culture (surgical/deep wound)     Status: None (Preliminary result)   Collection Time: 05/10/20  9:10 AM   Specimen: Soft Tissue, Other  Result Value Ref Range Status   Specimen Description TISSUE  Final   Special Requests TRICUSPID VALVE VEGETATION  Final   Gram Stain   Final    RARE WBC PRESENT, PREDOMINANTLY PMN ABUNDANT GRAM POSITIVE COCCI IN PAIRS IN CLUSTERS Performed at [facility], [address] [place], [place] [address]    Culture   Final    ABUNDANT METHICILLIN RESISTANT STAPHYLOCOCCUS AUREUS NO ANAEROBES ISOLATED; CULTURE IN PROGRESS FOR 5 DAYS    Report Status PENDING  Incomplete   Organism ID, Bacteria METHICILLIN RESISTANT STAPHYLOCOCCUS AUREUS  Final      Susceptibility   Methicillin resistant staphylococcus aureus - MIC*    CIPROFLOXACIN <=0.5 SENSITIVE Sensitive     ERYTHROMYCIN >=8 RESISTANT Resistant     GENTAMICIN <=0.5 SENSITIVE Sensitive     OXACILLIN >=4 RESISTANT Resistant     TETRACYCLINE <=1 SENSITIVE Sensitive     VANCOMYCIN 1 SENSITIVE Sensitive  TRIMETH/SULFA <=10 SENSITIVE Sensitive     CLINDAMYCIN <=0.25 SENSITIVE Sensitive     RIFAMPIN <=0.5 SENSITIVE Sensitive     Inducible Clindamycin NEGATIVE Sensitive     * ABUNDANT METHICILLIN RESISTANT STAPHYLOCOCCUS AUREUS  Acid Fast Smear (AFB)     Status: None   Collection Time: 05/10/20  9:10 AM   Specimen: Soft Tissue, Other  Result Value Ref Range Status   AFB Specimen Processing Concentration  Final   Acid Fast Smear Negative  Final    Comment: (NOTE) Performed At: [facility] [address] [place], [place] [id number] Jolene Schimke MD LZ:[id number]    Source (AFB) TRICUSPID VALVE VEGETATION  Final    Comment: Performed at [facility], [address] [place], [place] [address]  Fungus Culture  Result     Status: None   Collection Time: 05/10/20  9:10 AM  Result Value Ref Range Status   Result 1 Comment  Final    Comment: (NOTE) KOH/Calcofluor preparation:  no fungus observed. Performed At: [facility] [address] [place], [place] [id number] Jolene Schimke MD QA:[id number]     Studies/Results: DG CHEST PORT 1 VIEW  Result Date: 05/13/2020 CLINICAL DATA:  Chest pain.  History of endocarditis. EXAM: PORTABLE CHEST 1 VIEW COMPARISON:  05/10/2020 FINDINGS: The left jugular central venous catheter has been removed. A right PICC has been placed and terminates over the high right atrium. The cardiomediastinal silhouette is unchanged. Widespread patchy and nodular opacities throughout the right greater than left lungs have not significantly changed. Mild underlying interstitial densities are also again noted. A small right pleural effusion is questioned. No pneumothorax is identified. IMPRESSION: 1. Interval right PICC placement. 2. Unchanged bilateral lung opacities which may reflect septic emboli and pneumonia as well as possible underlying edema. Electronically Signed   By: Sebastian Ache M.D.   On: 05/13/2020 08:30   VAS [place] UPPER EXTREMITY VENOUS DUPLEX  Result Date: 05/11/2020 UPPER VENOUS STUDY  Indications: Pain Limitations: Line and bandages. Comparison Study: no prior Performing Technologist: Blanch Media RVS  Examination Guidelines: A complete evaluation includes B-mode imaging, spectral Doppler, color Doppler, and power Doppler as needed of all accessible portions of each vessel. Bilateral testing is considered an integral part of a complete examination. Limited examinations for reoccurring indications may be performed as noted.  Left Findings: +----------+------------+---------+-----------+----------+--------------+ LEFT      CompressiblePhasicitySpontaneousProperties   Summary     +----------+------------+---------+-----------+----------+--------------+ IJV                                                  Not visualized +----------+------------+---------+-----------+----------+--------------+ Subclavian    Full       Yes       Yes                             +----------+------------+---------+-----------+----------+--------------+ Axillary      Full       Yes       Yes                             +----------+------------+---------+-----------+----------+--------------+ Brachial      Full       Yes       Yes                             +----------+------------+---------+-----------+----------+--------------+  Radial        Full                                                 +----------+------------+---------+-----------+----------+--------------+ Ulnar         Full                                                 +----------+------------+---------+-----------+----------+--------------+ Cephalic      Full                                                 +----------+------------+---------+-----------+----------+--------------+ Basilic       Full                                                 +----------+------------+---------+-----------+----------+--------------+  Summary:  Left: No evidence of deep vein thrombosis in the upper extremity. No evidence of superficial vein thrombosis in the upper extremity.  *See table(s) above for measurements and observations.  Diagnosing physician: Fabienne Bruns MD Electronically signed by Fabienne Bruns MD on 05/11/2020 at 6:10:59 PM.    Final    [place] EKG SITE RITE  Result Date: 05/12/2020 If Site Rite image not attached, placement could not be confirmed due to current cardiac rhythm.     Assessment/Plan:  INTERVAL HISTORY:  Worsening respiratory status, lethargic.   Principal Problem:   Endocarditis of tricuspid valve Active Problems:   Septic embolism (HCC)   MRSA bacteremia   Hand abscess   IVDU (intravenous drug user)   Sepsis (HCC)   Thrombocytopenia (HCC)    Hypokalemia   Hyponatremia   Malnutrition of moderate degree   Threatening behavior   Elevated BUN   Pressure injury of skin    Clifford Clark is a 29 y.o. 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  He now has some neck pain where his central line is as well as new onset headache.  1.  MSSA/MRSA bacteremia with tricuspid valve endocarditis and septic emboli: He seems to have done well having undergone angio VAC and fevers have defervesced. (one isolated fever yesterday) I would like to give him at least 4 weeks of IV vancomycin in the hospital and then consider switch to a bioavailable oral agent. Will need to restart his antibiotic timeline to include MRSA coverage given he had nearly 2 weeks without it.   2. Acute Respiratory Change - D/W Dr. Roda Shutters. ABG to be done, CXR today with stable opacities, but in my view he has more opacification today R>L compared to 7/16 --> Maybe some component of pulmonary edema but would think to exclude PE. Will check CTA of chest. May require intubation.   3.  Neck painDoppler  Negative and pain resolved  4.  Back pain: MRI did not show evidence of discitis in the thoracic or lumbar area but  could have infection there and just not be showing up on MRI yet.  5.  For IV drug use: He will need plan for addressing this root cause   LOS: 15 days   Rexene Alberts 05/13/2020, 2:46 PM

## 2020-05-13 NOTE — Progress Notes (Signed)
RT NOTES: ABG obtained and sent to lab. Lab tech notified.  

## 2020-05-13 NOTE — Plan of Care (Signed)

## 2020-05-13 NOTE — Plan of Care (Signed)
Discussed clinical decompensation with pt's mother.  Verbal consent obtained to proceed with mechanical ventilation after discussion of risks/benefits in this clinical case.  Verbal consent obtained to utilize pressors if clinically indicated.   Tessie Fass MSN, AGACNP-BC [facility] [phone] If no answer, [phone] 05/13/2020, 4:33 PM

## 2020-05-13 NOTE — Significant Event (Addendum)
Rapid Response Event Note  Overview: Time Called: 0806 Arrival Time: 0815  Event End Time: 0855 Event Type: Cardiac Chest pain, tachycardia (HR 130s bpm)  Initial Focused Assessment: Pt lying in bed, grimacing. Pt oriented and able to follow commands. Skin is very warm/hot, dry to touch. Pulses 2+, pt tachycardic. Lung sounds are clear, diminished R>L. Lung bases are diminished. No adventitious heart sounds. Pt right groin firm to palpation around incision site, left groin soft. No bruising or discoloration to right groin, small incision from suture site covered with protective dressing.  VS: T 98.9, BP 129/84, HR 129, RR 33-45 on 2LNC  Interventions: -No intervention from RRRN  -Orders received for CBC w/ diff, CMP, Mg, Trop, CXR, 12-lead EKG -PRNs available for pain and tachycardia  Plan of Care (if not transferred): -Pulmonary hygiene: promote mobility if MD allows, frequent turning and repositioning, cough and deep breathing, incentive spirometry -Pain and anxiety management -Oral care per protocol, encourage oral hydration  -Pain improved, pt now resting  Event Summary: Name of Physician Notified: Dr. Roda Shutters at [phone] Outcome: Stayed in room and stabalized  Clifford Clark

## 2020-05-13 NOTE — Progress Notes (Signed)
CRITICAL VALUE ALERT  Critical Value:  Troponin 192  Date & Time Notied:  05/13/2020 @0948   Provider Notified: Dr.  Orders Received/Actions taken: continue monitoring, will have another troponin in 2hrs

## 2020-05-13 NOTE — Progress Notes (Signed)
   05/13/20 0900  Assess: MEWS Score  BP 115/85  Pulse Rate 86  ECG Heart Rate (!) 120  Resp (!) 35  SpO2 94 %  Assess: MEWS Score  MEWS Temp 0  MEWS Systolic 0  MEWS Pulse 2  MEWS RR 2  MEWS LOC 0  MEWS Score 4  MEWS Score Color Red  Assess: if the MEWS score is Yellow or Red  Were vital signs taken at a resting state? Yes  Focused Assessment No change from prior assessment  Early Detection of Sepsis Score *See Row Information* Low  MEWS guidelines implemented *See Row Information* Yes  Escalate  MEWS: Escalate Red: discuss with charge nurse/RN and provider, consider discussing with RRT  Notify: Charge Nurse/RN  Name of Charge Nurse/RN Notified creshenda  Date Charge Nurse/RN Notified 05/13/20  Time Charge Nurse/RN Notified 0902  Document  Patient Outcome Not stable and remains on department  Progress note created (see row info) Yes  remains tachypnic, c/o pain rt groin, bilateral hands, oxycodone given for pain

## 2020-05-13 NOTE — Progress Notes (Signed)
CRITICAL VALUE ALERT  Critical Value: MRSA PCR positive  Date & Time Notied:  05/13/2020 2117  Provider Notified: has been positive for MRSA on blood cultures  Orders Received/Actions taken: MRSA order set initiated

## 2020-05-13 NOTE — Progress Notes (Signed)
RT transported pt to and from CT without event. 

## 2020-05-13 NOTE — Progress Notes (Addendum)
   05/13/20 0804  Vitals  Temp 98.9 F (37.2 C)  Temp Source Oral  BP (!) 118/87  MAP (mmHg) 97  BP Location Left Arm  BP Method Automatic  Patient Position (if appropriate) Lying  Pulse Rate (!) 136  Pulse Rate Source Monitor  ECG Heart Rate (!) 136  Resp (!) 34  Level of Consciousness  Level of Consciousness Alert  Oxygen Therapy  SpO2 93 %  O2 Device Nasal Cannula  O2 Flow Rate (L/min) 2 L/min  Pre-WUA / WUA Start  [place] Agitation Sedation Scale (RASS) 3  RASS Goal 0  Pain Assessment  Pain Scale 0-10  MEWS Score  MEWS Temp 0  MEWS Systolic 0  MEWS Pulse 3  MEWS RR 2  MEWS LOC 0  MEWS Score 5  MEWS Score Color Red  Provider Notification  Provider Name/Title dr Roda Shutters  Date Provider Notified 05/13/20  Time Provider Notified [phone]  Notification Type Page  Notification Reason Change in status  Response See new orders  Date of Provider Response 05/13/20  Time of Provider Response 0807  Rapid Response Notification  Name of Rapid Response RN Notified shelby  Date Rapid Response Notified 05/13/20  Time Rapid Response Notified 3810

## 2020-05-14 ENCOUNTER — Inpatient Hospital Stay (HOSPITAL_COMMUNITY): Payer: Self-pay

## 2020-05-14 DIAGNOSIS — Z9911 Dependence on respirator [ventilator] status: Secondary | ICD-10-CM

## 2020-05-14 DIAGNOSIS — I340 Nonrheumatic mitral (valve) insufficiency: Secondary | ICD-10-CM

## 2020-05-14 DIAGNOSIS — J15212 Pneumonia due to Methicillin resistant Staphylococcus aureus: Secondary | ICD-10-CM

## 2020-05-14 LAB — ECHOCARDIOGRAM LIMITED
Area-P 1/2: 4.57 cm2
Height: 67 in
Weight: 1940.05 oz

## 2020-05-14 LAB — BASIC METABOLIC PANEL
Anion gap: 8 (ref 5–15)
BUN: 19 mg/dL (ref 6–20)
CO2: 25 mmol/L (ref 22–32)
Calcium: 7.1 mg/dL — ABNORMAL LOW (ref 8.9–10.3)
Chloride: 104 mmol/L (ref 98–111)
Creatinine, Ser: 0.64 mg/dL (ref 0.61–1.24)
GFR calc Af Amer: >60 mL/min (ref >60)
GFR calc non Af Amer: >60 mL/min (ref >60)
Glucose, Bld: 120 mg/dL — ABNORMAL HIGH (ref 70–99)
Potassium: 3.6 mmol/L (ref 3.5–5.1)
Sodium: 137 mmol/L (ref 135–145)

## 2020-05-14 LAB — COOXEMETRY PANEL
Carboxyhemoglobin: 1.3 % (ref 0.5–1.5)
Methemoglobin: 1.1 % (ref 0.0–1.5)
O2 Saturation: 71.9 %
Total hemoglobin: 7.1 g/dL — ABNORMAL LOW (ref 12.0–16.0)

## 2020-05-14 LAB — PHOSPHORUS: Phosphorus: 3.8 mg/dL (ref 2.5–4.6)

## 2020-05-14 LAB — VANCOMYCIN, TROUGH: Vancomycin Tr: 14 ug/mL — ABNORMAL LOW (ref 15–20)

## 2020-05-14 LAB — TRIGLYCERIDES: Triglycerides: 175 mg/dL — ABNORMAL HIGH (ref <150)

## 2020-05-14 LAB — GLUCOSE, CAPILLARY
Glucose-Capillary: 100 mg/dL — ABNORMAL HIGH (ref 70–99)
Glucose-Capillary: 109 mg/dL — ABNORMAL HIGH (ref 70–99)
Glucose-Capillary: 120 mg/dL — ABNORMAL HIGH (ref 70–99)

## 2020-05-14 LAB — MAGNESIUM: Magnesium: 2 mg/dL (ref 1.7–2.4)

## 2020-05-14 MED ORDER — LACTATED RINGERS IV BOLUS
500.0000 mL | Freq: Once | INTRAVENOUS | Status: AC
  Administered 2020-05-14: 500 mL via INTRAVENOUS

## 2020-05-14 MED ORDER — FENTANYL 2500MCG IN NS 250ML (10MCG/ML) PREMIX INFUSION
50.0000 ug/h | INTRAVENOUS | Status: DC
  Administered 2020-05-14: 100 ug/h via INTRAVENOUS
  Administered 2020-05-15: 150 ug/h via INTRAVENOUS
  Administered 2020-05-15 – 2020-05-17 (×3): 175 ug/h via INTRAVENOUS
  Administered 2020-05-17 – 2020-05-18 (×2): 200 ug/h via INTRAVENOUS
  Filled 2020-05-14 (×7): qty 250

## 2020-05-14 MED ORDER — ACETAMINOPHEN 160 MG/5ML PO SOLN
325.0000 mg | Freq: Four times a day (QID) | ORAL | Status: DC | PRN
  Filled 2020-05-14: qty 20.3

## 2020-05-14 MED ORDER — DOCUSATE SODIUM 50 MG/5ML PO LIQD
100.0000 mg | Freq: Two times a day (BID) | ORAL | Status: DC
  Administered 2020-05-14 – 2020-05-17 (×6): 100 mg
  Filled 2020-05-14 (×7): qty 10

## 2020-05-14 MED ORDER — FENTANYL BOLUS VIA INFUSION
50.0000 ug | INTRAVENOUS | Status: DC | PRN
  Administered 2020-05-15 – 2020-05-17 (×2): 50 ug via INTRAVENOUS
  Filled 2020-05-14: qty 50

## 2020-05-14 MED ORDER — VITAL HIGH PROTEIN PO LIQD
1000.0000 mL | ORAL | Status: DC
  Administered 2020-05-14 – 2020-05-16 (×3): 1000 mL

## 2020-05-14 MED ORDER — CHLORHEXIDINE GLUCONATE 0.12% ORAL RINSE (MEDLINE KIT)
15.0000 mL | Freq: Two times a day (BID) | OROMUCOSAL | Status: DC
  Administered 2020-05-14 – 2020-05-17 (×8): 15 mL via OROMUCOSAL

## 2020-05-14 MED ORDER — PANTOPRAZOLE SODIUM 40 MG PO PACK
20.0000 mg | PACK | Freq: Every day | ORAL | Status: DC
  Administered 2020-05-14 – 2020-05-17 (×4): 20 mg
  Filled 2020-05-14 (×5): qty 20

## 2020-05-14 MED ORDER — ONDANSETRON HCL 4 MG PO TABS: 4.0000 mg | ORAL_TABLET | Freq: Four times a day (QID) | ORAL | Status: DC | PRN

## 2020-05-14 MED ORDER — FENTANYL CITRATE (PF) 100 MCG/2ML IJ SOLN
50.0000 ug | Freq: Once | INTRAMUSCULAR | Status: AC
  Administered 2020-05-14: 50 ug via INTRAVENOUS

## 2020-05-14 MED ORDER — ORAL CARE MOUTH RINSE
15.0000 mL | OROMUCOSAL | Status: DC
  Administered 2020-05-14 – 2020-05-18 (×35): 15 mL via OROMUCOSAL

## 2020-05-14 MED ORDER — POLYETHYLENE GLYCOL 3350 17 G PO PACK
17.0000 g | PACK | Freq: Every day | ORAL | Status: DC
  Administered 2020-05-14 – 2020-05-16 (×3): 17 g
  Filled 2020-05-14 (×3): qty 1

## 2020-05-14 MED ORDER — PROSOURCE TF PO LIQD
45.0000 mL | Freq: Two times a day (BID) | ORAL | Status: DC
  Administered 2020-05-14 – 2020-05-16 (×5): 45 mL
  Filled 2020-05-14 (×5): qty 45

## 2020-05-14 MED ORDER — ADULT MULTIVITAMIN LIQUID CH
15.0000 mL | Freq: Every day | ORAL | Status: DC
  Administered 2020-05-14 – 2020-05-16 (×3): 15 mL
  Filled 2020-05-14 (×3): qty 15

## 2020-05-14 NOTE — Progress Notes (Signed)
Spoke with Dr. Warrick Parisian with [facility]. It is okay to use left subclavian central line and OGT.

## 2020-05-14 NOTE — Progress Notes (Signed)
*  PRELIMINARY RESULTS* Echocardiogram 2D Echocardiogram limited has been performed.  Clifford Clark 05/14/2020, 4:22 PM

## 2020-05-14 NOTE — Consult Note (Addendum)
NAME:  Clifford Clark, MRN:  [id number], DOB:  October 11, 1991, LOS: 16 ADMISSION DATE:  04/28/2020, CONSULTATION DATE:  05/13/2020 REFERRING MD:  Albertine Grates CHIEF COMPLAINT:  Endocarditis  Brief History   29 yo male with PMH significant for IVDA (fentanyl), asthma admitted with tricuspid valve endocarditis, MRSA bacteremia.   History of present illness   Patient presented to ED on 7/08 after being found on the ground by a friend.  His work up revealed Tricuspid valve endocarditis with MRSA bacteremia. He was also found to have septic emboli to kidneys and lungs. CT of hands showed bilateral abscesses and underwent I&D on 7/13. 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 Patient was found to have increasing tachycardia and tachypnea today.     Patient is negative 4 L to date and negative 700 ml on I/O.  T max 103. A CTA Chest was completed, post imaging, patient returned to room and found to have respiratory rate in 50's.  CCM consulted and patient transferred to ICU for intubation and mechanical ventilation. Post intubation, patient did require levo during intubation.     Past Medical History  IVDA --Fentanyl Asthma  Significant Hospital Events   7/08: Admitted 7/13: I&D of bilateral and abscesses  Consults:  ID Orthopedics CVTS  Psych PCCM  Procedures:  7/13: I&D of bilateral and abscesses 7/20: Debridement of right atrial mass and debridement of tricuspid valve vegetation 7/22: PICC line placed 7/23: CVC 7/23: ETT  Significant Diagnostic Tests:  7/8: Abdominal U/S: Hepatomegaly 7/16: TEE: LVEF 60-65%, RV normal, left atrial thrombus, small pericardial effusion, MVR. Tricuspid valve vegetation.  Pulmonic valve vegetation. 7/21: [place] of LUE without evidence of superficial or deep vein thrombosis  7/23: CTA chest without PE, multifocal PNA and septic emboli -worsening compared to previous studies.  Micro  Data:  7/8 Blood Cx: MRSA Bacteremia 7/13: Blood Cx: MRSA Bacteremia 7/13: Fungal Cx: NGTD 7/20: Blood Cx:  MRSA Bacteremia 7/23 Blood Cx: >> 7/23 respiratory Cx >>  Antimicrobials:  Vanc Mero 7/23  Interim history/subjective:  Sedated but interactive.  Remains on norepinephrine.  Objective   Blood pressure (!) 87/66, pulse 102, temperature (!) 100.9 F (38.3 C), temperature source Rectal, resp. rate 22, height 5\' 7"  (1.702 m), weight 55 kg, SpO2 100 %.    Vent Mode: PRVC FiO2 (%):  [40 %-50 %] 40 % Set Rate:  [24 bmp] 24 bmp Vt Set:  [390 mL] 390 mL PEEP:  [8 cmH20] 8 cmH20 Plateau Pressure:  [16 cmH20-26 cmH20] 16 cmH20   Intake/Output Summary (Last 24 hours) at 05/14/2020 1429 Last data filed at 05/14/2020 1347 Gross per 24 hour  Intake 1884.96 ml  Output 1325 ml  Net 559.96 ml   Filed Weights   04/28/20 2130  Weight: 55 kg    Examination: General: Adult male, severe distress, thin. HENT: Orally intubated.  OG tube in place.  Minimal OG output. Lungs: Mechanically ventilated.  Diffuse rhonchi right side.  Decreased breath sounds at both bases. Cardiovascular: Extremities warm.  Heart sounds unremarkable. Abdomen: Soft and nontender. Extremities: no deformities Neuro: Sedated.  Follows commands moves all extremities.  I performed a point-of-care echocardiogram 7/24:  Hyperdynamic circulation with moderately reduced LV systolic function.  Moderately dilated RV with normal function.  Abnormal looking tricuspid valve with possible sessile mass adherent to lateral leaflet.  Severe tricuspid regurgitation.  IVC at upper limit of normal with no respiratory variation.  Under notified structure adherent to lateral wall of right atrium, possibly clot.  Cordlike structure adherent to intra-atrial septum, possible Chiari network.  LV function appears reduced compared to prior studies.  Resolved Hospital Problem list   AKI Elevated LFTs  Assessment & Plan:  Critically ill  due to acute hypoxic respiratory failure requiring mechanical ventilation.  Cause for respiratory decompensation as yet unclear.  Differential: Further embolization. -Continue full ventilatory support. -Initiate SBT's in a.m.  Multifocal pneumonia with cavitary lesions consistent with embolization from known MRSA tricuspid valve endocarditis.  Critically ill due to septic shock requiring titration of norepinephrine -Fluid challenge -Continue to titrate norepinephrine to keep MAP greater than 65.  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 -ID following -Continue vancomycin -Repeat formal echo.  Unclear if sessile mass worse than on postoperative TEE.  Daily Goals Checklist  Pain/Anxiety/Delirium protocol (if indicated): adding fentanyl for pain control.  Reduced dose propofol may help blood pressure. VAP protocol (if indicated): Bundle in place Respiratory support goals: Full ventilatory support.  Initiate SBT in a.m. Blood pressure target: Titrate to keep MAP greater than 65 DVT prophylaxis: Enoxaparin Nutrition Status: High nutritional risk, initiate enteral feeds GI prophylaxis: Pantoprazole Fluid status goals: Allow autoregulation in positive fluid balance Urinary catheter: External catheter Central lines: Right arm single-lumen PICC, left subclavian triple-lumen. Glucose control: Euglycemic.  No history of diabetes. Mobility/therapy needs: Bedrest Antibiotic de-escalation: Continue vancomycin. Home medication reconciliation: On hold. Daily labs: CBC, BMP Code Status: Full code Family Communication: We will update family Disposition: ICU   Labs   CBC: Recent Labs  Lab 05/09/20 0307 05/10/20 0802 05/10/20 0926 05/12/20 0500 05/13/20 0839 05/13/20 1815 05/13/20 1834  WBC 14.7*  --   --  13.3* 17.8*  --  15.7*  NEUTROABS  --   --   --   --  14.9*  --    --   HGB 7.2*   < > 8.2* 8.4* 8.1* 9.5* 8.5*  HCT 22.8*   < > 24.0* 25.6* 24.5* 28.0* 26.2*  MCV 88.4  --   --  89.2 88.8  --  90.0  PLT 178  --   --  145* 124*  --  123*   < > = values in this interval not displayed.    Basic Metabolic Panel: Recent Labs  Lab 05/09/20 0307 05/10/20 0802 05/10/20 0926 05/12/20 0500 05/13/20 0839 05/13/20 1815 05/13/20 1834  NA 136   < > 138 133* 132* 133* 131*  K 4.3   < > 4.3 4.2 4.1 4.2 4.1  CL 102  --   --  101 97*  --  98  CO2 25  --   --  25 27  --  24  GLUCOSE 116*  --   --  107* 117*  --  163*  BUN 10  --   --  10 8  --  13  CREATININE 0.73  --   --  0.74 0.70  --  0.71  CALCIUM 7.7*  --   --  7.4* 7.5*  --  7.4*  MG  --   --   --  1.8 1.8  --  1.9  PHOS  --   --   --   --   --   --  4.1   < > = values in this interval not displayed.   GFR: Estimated Creatinine Clearance: 106 mL/min (  by C-G formula based on SCr of 0.71 mg/dL). Recent Labs  Lab 05/09/20 0307 05/12/20 0500 05/13/20 0839 05/13/20 1834 05/13/20 2050  WBC 14.7* 13.3* 17.8* 15.7*  --   LATICACIDVEN  --   --   --  1.5 1.2    Liver Function Tests: Recent Labs  Lab 05/09/20 0307 05/12/20 0500 05/13/20 0839 05/13/20 1834  AST 25 105* 37 39  ALT 16 83* 40 41  ALKPHOS 69 124 110 120  BILITOT 0.5 0.6 0.6 0.8  PROT 5.6* 5.8* 6.2* 6.3*  ALBUMIN 1.4* 1.5* 1.4* 1.5*   No results for input(s): LIPASE, AMYLASE in the last 168 hours. No results for input(s): AMMONIA in the last 168 hours.  ABG    Component Value Date/Time   PHART 7.369 05/13/2020 1815   PCO2ART 45.9 05/13/2020 1815   PO2ART 137 (H) 05/13/2020 1815   HCO3 26.4 05/13/2020 1815   TCO2 28 05/13/2020 1815   ACIDBASEDEF 0.6 05/06/2020 2215   O2SAT 99.0 05/13/2020 1815     Coagulation Profile: No results for input(s): INR, PROTIME in the last 168 hours.  Cardiac Enzymes: No results for input(s): CKTOTAL, CKMB, CKMBINDEX, TROPONINI in the last 168 hours.  HbA1C: No results found for:  HGBA1C  CBG: No results for input(s): GLUCAP in the last 168 hours.   CRITICAL CARE Performed by: Lynnell Catalan  Total critical care time: 45 minutes  Critical care time was exclusive of separately billable procedures and treating other patients.  Critical care was necessary to treat or prevent imminent or life-threatening deterioration.  Critical care was time spent personally by me on the following activities: development of treatment plan with patient and/or surrogate as well as nursing, discussions with consultants, evaluation of patient's response to treatment, examination of patient, obtaining history from patient or surrogate, ordering and performing treatments and interventions, ordering and review of laboratory studies, ordering and review of radiographic studies, pulse oximetry, re-evaluation of patient's condition and participation in multidisciplinary rounds.  Lynnell Catalan, MD [facility] ICU Physician [facility] [place] [facility]  Pager: [phone] Mobile: [phone] After hours: [phone].

## 2020-05-14 NOTE — Progress Notes (Signed)
Subjective:  On the ventilator   Antibiotics:  Anti-infectives (From admission, onward)   Start     Dose/Rate Route Frequency Ordered Stop   05/13/20 1730  meropenem (MERREM) 1 g in sodium chloride 0.9 % 100 mL IVPB  Status:  Discontinued        1 g 200 mL/hr over 30 Minutes Intravenous Every 8 hours 05/13/20 1720 05/14/20 1154   05/12/20 1600  vancomycin (VANCOREADY) IVPB 750 mg/150 mL     Discontinue     750 mg 150 mL/hr over 60 Minutes Intravenous Every 8 hours 05/12/20 1509     05/05/20 1600  ceFAZolin (ANCEF) IVPB 2g/100 mL premix  Status:  Discontinued        2 g 200 mL/hr over 30 Minutes Intravenous Every 8 hours 05/05/20 1452 05/12/20 1509   05/04/20 1200  vancomycin (VANCOCIN) IVPB 1000 mg/200 mL premix  Status:  Discontinued        1,000 mg 200 mL/hr over 60 Minutes Intravenous Every 12 hours 05/04/20 1045 05/04/20 1055   05/04/20 1200  vancomycin (VANCOREADY) IVPB 750 mg/150 mL  Status:  Discontinued        750 mg 150 mL/hr over 60 Minutes Intravenous Every 8 hours 05/04/20 1055 05/05/20 1452   05/03/20 0945  vancomycin (VANCOCIN) powder  Status:  Discontinued          As needed 05/03/20 0945 05/03/20 1057   05/03/20 0920  ceFAZolin (ANCEF) 2-4 GM/100ML-% IVPB       Note to Pharmacy: Clifford Clark   : cabinet override      05/03/20 0920 05/03/20 2129   05/02/20 1500  vancomycin (VANCOREADY) IVPB 750 mg/150 mL  Status:  Discontinued        750 mg 150 mL/hr over 60 Minutes Intravenous Every 12 hours 05/02/20 1425 05/04/20 1045   05/01/20 1330  vancomycin variable dose per unstable renal function (pharmacist dosing)  Status:  Discontinued         Does not apply See admin instructions 05/01/20 1330 05/03/20 0810   04/29/20 1000  vancomycin (VANCOREADY) IVPB 750 mg/150 mL  Status:  Discontinued        750 mg 150 mL/hr over 60 Minutes Intravenous Every 8 hours 04/29/20 0834 05/01/20 1329   04/29/20 0400  metroNIDAZOLE (FLAGYL) IVPB 500 mg  Status:   Discontinued        500 mg 100 mL/hr over 60 Minutes Intravenous Every 8 hours 04/28/20 2122 04/29/20 0819   04/29/20 0400  ceFEPIme (MAXIPIME) 2 g in sodium chloride 0.9 % 100 mL IVPB  Status:  Discontinued        2 g 200 mL/hr over 30 Minutes Intravenous Every 8 hours 04/28/20 2138 04/29/20 0819   04/29/20 0100  vancomycin (VANCOCIN) IVPB 1000 mg/200 mL premix  Status:  Discontinued        1,000 mg 200 mL/hr over 60 Minutes Intravenous Every 8 hours 04/28/20 2146 04/29/20 0833   04/28/20 1715  ceFEPIme (MAXIPIME) 2 g in sodium chloride 0.9 % 100 mL IVPB        2 g 200 mL/hr over 30 Minutes Intravenous  Once 04/28/20 1708 04/28/20 1903   04/28/20 1715  metroNIDAZOLE (FLAGYL) IVPB 500 mg        500 mg 100 mL/hr over 60 Minutes Intravenous  Once 04/28/20 1708 04/28/20 2111   04/28/20 1715  vancomycin (VANCOCIN) IVPB 1000 mg/200 mL premix        1,000  mg 200 mL/hr over 60 Minutes Intravenous  Once 04/28/20 1708 04/28/20 1947      Medications: Scheduled Meds: . chlorhexidine gluconate (MEDLINE KIT)  15 mL Mouth Rinse BID  . Chlorhexidine Gluconate Cloth  6 each Topical Q0600  . docusate sodium  100 mg Oral BID  . enoxaparin (LOVENOX) injection  40 mg Subcutaneous Q24H  . mouth rinse  15 mL Mouth Rinse 10 times per day  . metoprolol tartrate  12.5 mg Oral BID  . multivitamin with minerals  1 tablet Oral Daily  . mupirocin ointment  1 application Nasal BID  . pantoprazole  20 mg Oral Daily  . polyethylene glycol  17 g Oral Daily  . Ensure Max Protein  11 oz Oral Daily  . sodium chloride flush  10-40 mL Intracatheter Q12H  . sodium chloride flush  3 mL Intravenous Q12H   Continuous Infusions: . sodium chloride    . norepinephrine (LEVOPHED) Adult infusion 3 mcg/min (05/14/20 1100)  . propofol (DIPRIVAN) infusion 50 mcg/kg/min (05/14/20 1100)  . vancomycin Stopped (05/14/20 0847)   PRN Meds:.acetaminophen **OR** acetaminophen, fentaNYL (SUBLIMAZE) injection, ipratropium-albuterol,  LORazepam, metoprolol tartrate, morphine injection, ondansetron **OR** ondansetron (ZOFRAN) IV, oxyCODONE, sodium chloride flush    Objective: Weight change:   Intake/Output Summary (Last 24 hours) at 05/14/2020 1154 Last data filed at 05/14/2020 1100 Gross per 24 hour  Intake 1656.32 ml  Output 1425 ml  Net 231.32 ml   Blood pressure 93/73, pulse (!) 129, temperature (!) 102.7 F (39.3 C), temperature source Rectal, resp. rate (!) 39, height '5\' 7"'  (1.702 m), weight 55 kg, SpO2 100 %. Temp:  [97.5 F (36.4 C)-103.1 F (39.5 C)] 102.7 F (39.3 C) (07/24 1000) Pulse Rate:  [40-169] 129 (07/24 1130) Resp:  [22-57] 39 (07/24 1130) BP: (80-153)/(50-111) 93/73 (07/24 1130) SpO2:  [93 %-100 %] 100 % (07/24 1130) FiO2 (%):  [40 %-50 %] 40 % (07/24 1130)  Physical Exam: General: On the ventilator trying to gesture with one of his hands which is in restraints HEENT: anicteric sclera, EOMI, central lines in place has a wound in the right neck where it looks like he had a central line attempt CVS tachycardic, could not appreciate murmurs Chest: Rhonchorous Abdomen: soft non-distended,  Extremities: He has some edema bilaterally Skin: no rashes Neuro: nonfocal  CBC:    BMET Recent Labs    05/13/20 0839 05/13/20 0839 05/13/20 1815 05/13/20 1834  NA 132*   < > 133* 131*  K 4.1   < > 4.2 4.1  CL 97*  --   --  98  CO2 27  --   --  24  GLUCOSE 117*  --   --  163*  BUN 8  --   --  13  CREATININE 0.70  --   --  0.71  CALCIUM 7.5*  --   --  7.4*   < > = values in this interval not displayed.     Liver Panel  Recent Labs    05/13/20 0839 05/13/20 1834  PROT 6.2* 6.3*  ALBUMIN 1.4* 1.5*  AST 37 39  ALT 40 41  ALKPHOS 110 120  BILITOT 0.6 0.8       Sedimentation Rate No results for input(s): ESRSEDRATE in the last 72 hours. C-Reactive Protein No results for input(s): CRP in the last 72 hours.  Micro Results: Recent Results (from the past 720 hour(s))  Culture,  blood (routine x 2)     Status: Abnormal  Collection Time: 04/28/20  3:45 PM   Specimen: BLOOD  Result Value Ref Range Status   Specimen Description   Final    BLOOD LEFT ANTECUBITAL Performed at [facility] [address], [place], [place] [address]    Special Requests   Final    BOTTLES DRAWN AEROBIC AND ANAEROBIC Blood Culture adequate volume Performed at [facility] [address], [place], [place] [address]    Culture  Setup Time   Final    GRAM POSITIVE COCCI IN BOTH AEROBIC AND ANAEROBIC BOTTLES CRITICAL RESULT CALLED TO, READ BACK BY AND VERIFIED WITH: PHARMD M. Lear Ng [phone] FCP    Culture (A)  Final    STAPHYLOCOCCUS AUREUS METHICILLIN RESISTANT ORGANISM WAS NOT RECOVERED IN CULTURE. REPEATED FOR CONFIRMATION Performed at [facility], [place] [address], [place], [place] [address]    Report Status 05/03/2020 FINAL  Final   Organism ID, Bacteria STAPHYLOCOCCUS AUREUS  Final      Susceptibility   Staphylococcus aureus - MIC*    CIPROFLOXACIN <=0.5 SENSITIVE Sensitive     ERYTHROMYCIN >=8 RESISTANT Resistant     GENTAMICIN <=0.5 SENSITIVE Sensitive     OXACILLIN 0.5 SENSITIVE Sensitive     TETRACYCLINE <=1 SENSITIVE Sensitive     VANCOMYCIN <=0.5 SENSITIVE Sensitive     TRIMETH/SULFA <=10 SENSITIVE Sensitive     CLINDAMYCIN <=0.25 SENSITIVE Sensitive     RIFAMPIN <=0.5 SENSITIVE Sensitive     Inducible Clindamycin NEGATIVE Sensitive     * STAPHYLOCOCCUS AUREUS  Blood Culture ID Panel (Reflexed)     Status: Abnormal   Collection Time: 04/28/20  3:45 PM  Result Value Ref Range Status   Enterococcus species NOT DETECTED NOT DETECTED Final   Listeria monocytogenes NOT DETECTED NOT DETECTED Final   Staphylococcus species DETECTED (A) NOT DETECTED Final    Comment: CRITICAL RESULT CALLED TO, READ BACK BY AND VERIFIED WITH: PHARMD Chales Abrahams [phone] FCP    Staphylococcus aureus (BCID) DETECTED (A) NOT DETECTED Final     Comment: Methicillin (oxacillin)-resistant Staphylococcus aureus (MRSA). MRSA is predictably resistant to beta-lactam antibiotics (except ceftaroline). Preferred therapy is vancomycin unless clinically contraindicated. Patient requires contact precautions if  hospitalized. CRITICAL RESULT CALLED TO, READ BACK BY AND VERIFIED WITH: PHARMD M. Lear Ng [phone] FCP    Methicillin resistance DETECTED (A) NOT DETECTED Final    Comment: CRITICAL RESULT CALLED TO, READ BACK BY AND VERIFIED WITH: PHARMD M. RENZ [phone] FCP    Streptococcus species NOT DETECTED NOT DETECTED Final   Streptococcus agalactiae NOT DETECTED NOT DETECTED Final   Streptococcus pneumoniae NOT DETECTED NOT DETECTED Final   Streptococcus pyogenes NOT DETECTED NOT DETECTED Final   Acinetobacter baumannii NOT DETECTED NOT DETECTED Final   Enterobacteriaceae species NOT DETECTED NOT DETECTED Final   Enterobacter cloacae complex NOT DETECTED NOT DETECTED Final   Escherichia coli NOT DETECTED NOT DETECTED Final   Klebsiella oxytoca NOT DETECTED NOT DETECTED Final   Klebsiella pneumoniae NOT DETECTED NOT DETECTED Final   Proteus species NOT DETECTED NOT DETECTED Final   Serratia marcescens NOT DETECTED NOT DETECTED Final   Haemophilus influenzae NOT DETECTED NOT DETECTED Final   Neisseria meningitidis NOT DETECTED NOT DETECTED Final   Pseudomonas aeruginosa NOT DETECTED NOT DETECTED Final   Candida albicans NOT DETECTED NOT DETECTED Final   Candida glabrata NOT DETECTED NOT DETECTED Final   Candida krusei NOT DETECTED NOT DETECTED Final   Candida parapsilosis NOT DETECTED NOT DETECTED  Final   Candida tropicalis NOT DETECTED NOT DETECTED Final    Comment: Performed at [facility], [place] [address], [place], [place] [address]  Culture, blood (routine x 2)     Status: Abnormal   Collection Time: 04/28/20  4:13 PM   Specimen: BLOOD  Result Value Ref Range Status   Specimen Description   Final    BLOOD RIGHT  ANTECUBITAL Performed at [facility] [address] [place], [place] [address]    Special Requests   Final    BOTTLES DRAWN AEROBIC AND ANAEROBIC Blood Culture adequate volume Performed at [facility] [address], [place], [place] [address]    Culture  Setup Time   Final    GRAM POSITIVE COCCI IN BOTH AEROBIC AND ANAEROBIC BOTTLES CRITICAL VALUE NOTED.  VALUE IS CONSISTENT WITH PREVIOUSLY REPORTED AND CALLED VALUE.    Culture (A)  Final    STAPHYLOCOCCUS AUREUS METHICILLIN RESISTANT ORGANISM WAS NOT RECOVERED IN CULTURE. REPEATED FOR CONFIRMATION Performed at [facility], [place] [address], [place], [place] [address]    Report Status 05/03/2020 FINAL  Final   Organism ID, Bacteria STAPHYLOCOCCUS AUREUS  Final      Susceptibility   Staphylococcus aureus - MIC*    CIPROFLOXACIN <=0.5 SENSITIVE Sensitive     ERYTHROMYCIN >=8 RESISTANT Resistant     GENTAMICIN <=0.5 SENSITIVE Sensitive     OXACILLIN 0.5 SENSITIVE Sensitive     TETRACYCLINE <=1 SENSITIVE Sensitive     VANCOMYCIN <=0.5 SENSITIVE Sensitive     TRIMETH/SULFA <=10 SENSITIVE Sensitive     CLINDAMYCIN <=0.25 SENSITIVE Sensitive     RIFAMPIN <=0.5 SENSITIVE Sensitive     Inducible Clindamycin NEGATIVE Sensitive     * STAPHYLOCOCCUS AUREUS  Urine culture     Status: Abnormal   Collection Time: 04/28/20  7:00 PM   Specimen: In/Out Cath Urine  Result Value Ref Range Status   Specimen Description   Final    IN/OUT CATH URINE Performed at [facility] [address] [place], [place] [address]    Special Requests   Final    NONE Performed at [facility], [place] [address] [place], [place] [address]    Culture >=100,000 COLONIES/mL STAPHYLOCOCCUS AUREUS (A)  Final   Report Status 05/01/2020 FINAL  Final   Organism ID, Bacteria STAPHYLOCOCCUS AUREUS (A)  Final      Susceptibility   Staphylococcus aureus - MIC*    CIPROFLOXACIN <=0.5  SENSITIVE Sensitive     GENTAMICIN <=0.5 SENSITIVE Sensitive     NITROFURANTOIN <=16 SENSITIVE Sensitive     OXACILLIN 0.5 SENSITIVE Sensitive     TETRACYCLINE <=1 SENSITIVE Sensitive     VANCOMYCIN <=0.5 SENSITIVE Sensitive     TRIMETH/SULFA <=10 SENSITIVE Sensitive     CLINDAMYCIN <=0.25 SENSITIVE Sensitive     RIFAMPIN <=0.5 SENSITIVE Sensitive     Inducible Clindamycin NEGATIVE Sensitive     * >=100,000 COLONIES/mL STAPHYLOCOCCUS AUREUS  SARS Coronavirus 2 by RT PCR (hospital order, performed in [facility] hospital lab) Nasopharyngeal Nasopharyngeal Swab     Status: None   Collection Time: 04/28/20  7:03 PM   Specimen: Nasopharyngeal Swab  Result Value Ref Range Status   SARS Coronavirus 2 NEGATIVE NEGATIVE Final    Comment: (NOTE) SARS-CoV-2 target nucleic acids are NOT DETECTED.  The SARS-CoV-2 RNA is generally detectable in upper and lower respiratory specimens during the acute phase of infection. The lowest concentration of  SARS-CoV-2 viral copies this assay can detect is 250 copies / mL. A negative result does not preclude SARS-CoV-2 infection and should not be used as the sole basis for treatment or other patient management decisions.  A negative result may occur with improper specimen collection / handling, submission of specimen other than nasopharyngeal swab, presence of viral mutation(s) within the areas targeted by this assay, and inadequate number of viral copies (<250 copies / mL). A negative result must be combined with clinical observations, patient history, and epidemiological information.  Fact Sheet for Patients:   StrictlyIdeas.no  Fact Sheet for Healthcare Providers: BankingDealers.co.za  This test is not yet approved or  cleared by the [place] FDA and has been authorized for detection and/or diagnosis of SARS-CoV-2 by FDA under an Emergency Use Authorization (EUA).  This EUA will remain in effect  (meaning this test can be used) for the duration of the COVID-19 declaration under Section 564(b)(1) of the Act, 21 U.S.C. section 360bbb-3(b)(1), unless the authorization is terminated or revoked sooner.  Performed at [facility], [place] [address], [place], [place] [address]   MRSA PCR Screening     Status: Abnormal   Collection Time: 04/28/20  7:45 PM   Specimen: Nasal Mucosa; Nasopharyngeal  Result Value Ref Range Status   MRSA by PCR POSITIVE (A) NEGATIVE Final    Comment:        The GeneXpert MRSA Assay (FDA approved for NASAL specimens only), is one component of a comprehensive MRSA colonization surveillance program. It is not intended to diagnose MRSA infection nor to guide or monitor treatment for MRSA infections. RESULT CALLED TO, READ BACK BY AND VERIFIED WITH: S SMITH AT 2314 ON 04/28/2020 BY MOSLEY,J  Performed at [facility], [place] [address], [place], [place] [address]   Culture, blood (Routine X 2) w Reflex to ID Panel     Status: None   Collection Time: 04/30/20  8:15 AM   Specimen: BLOOD  Result Value Ref Range Status   Specimen Description   Final    BLOOD LEFT ANTECUBITAL Performed at [facility], [place] [address] [place], [place] [address]    Special Requests   Final    BOTTLES DRAWN AEROBIC AND ANAEROBIC Blood Culture adequate volume Performed at [facility] [address] [place], [place] [address]    Culture   Final    NO GROWTH 5 DAYS Performed at [facility], [place] [address], [place], [place] [address]    Report Status 05/05/2020 FINAL  Final  Culture, blood (Routine X 2) w Reflex to ID Panel     Status: None   Collection Time: 04/30/20  8:15 AM   Specimen: BLOOD LEFT FOREARM  Result Value Ref Range Status   Specimen Description   Final    BLOOD LEFT FOREARM Performed at [facility] [address], [place], [place] [address]    Special Requests   Final     BOTTLES DRAWN AEROBIC ONLY Blood Culture adequate volume Performed at [facility] [address], [place], [place] [address]    Culture   Final    NO GROWTH 5 DAYS Performed at [facility], [place] [address], [place], [place] [address]    Report Status 05/05/2020 FINAL  Final  Fungus Culture With Stain     Status: None (Preliminary result)   Collection Time: 05/03/20  9:35 AM   Specimen: Soft Tissue, Other  Result Value Ref Range Status   Fungus Stain Final report  Final    Comment: (NOTE) Performed At: [facility] [place], [place] [id number] Rush Farmer MD [id number]:[id number]    Fungus (Mycology) Culture PENDING  Incomplete   Fungal Source TISSUE  Final    Comment: RT WRIST Performed at [facility], [place] [address], [place], [place] [address]   Aerobic/Anaerobic Culture (surgical/deep wound)     Status: None   Collection Time: 05/03/20  9:35 AM   Specimen: Soft Tissue, Other  Result Value Ref Range Status   Specimen Description   Final    TISSUE RT WRIST Performed at [facility] [address], [place], [place] [address]    Special Requests   Final    PODIATRY Derrek Monaco Performed at [facility] [address] [place], [place] [address]    Gram Stain   Final    RARE WBC PRESENT,BOTH PMN AND MONONUCLEAR RARE GRAM POSITIVE COCCI IN PAIRS IN CLUSTERS    Culture   Final    RARE STAPHYLOCOCCUS AUREUS NO ANAEROBES ISOLATED Performed at [facility], [address], [place], [place] [address]    Report Status 05/08/2020 FINAL  Final   Organism ID, Bacteria STAPHYLOCOCCUS AUREUS  Final      Susceptibility   Staphylococcus aureus - MIC*    CIPROFLOXACIN <=0.5 SENSITIVE Sensitive     ERYTHROMYCIN >=8 RESISTANT Resistant     GENTAMICIN <=0.5 SENSITIVE Sensitive     OXACILLIN 0.5 SENSITIVE Sensitive     TETRACYCLINE <=1 SENSITIVE Sensitive     VANCOMYCIN  <=0.5 SENSITIVE Sensitive     TRIMETH/SULFA <=10 SENSITIVE Sensitive     CLINDAMYCIN <=0.25 SENSITIVE Sensitive     RIFAMPIN <=0.5 SENSITIVE Sensitive     Inducible Clindamycin NEGATIVE Sensitive     * RARE STAPHYLOCOCCUS AUREUS  Fungus Culture Result     Status: None   Collection Time: 05/03/20  9:35 AM  Result Value Ref Range Status   Result 1 Comment  Final    Comment: (NOTE) KOH/Calcofluor preparation:  no fungus observed. Performed At: [facility] [place], [place] [id number] Rush Farmer MD [id number]   Fungus Culture With Stain     Status: None (Preliminary result)   Collection Time: 05/03/20  9:38 AM   Specimen: Soft Tissue, Other  Result Value Ref Range Status   Fungus Stain Final report  Final    Comment: (NOTE) Performed At: [facility] [address] [place], [place] [id number] Rush Farmer MD [id number]    Fungus (Mycology) Culture PENDING  Incomplete   Fungal Source TISSUE  Final    Comment: LT HAND Performed at [facility], [place] [address] [place], [place] [address]   Aerobic/Anaerobic Culture (surgical/deep wound)     Status: None   Collection Time: 05/03/20  9:38 AM   Specimen: Soft Tissue, Other  Result Value Ref Range Status   Specimen Description   Final    TISSUE LT HAND Performed at [facility] [address] [place], [place] [address]    Special Requests   Final    PATIENT ON FOLLOWING ANCEF, [facility] Performed at [facility] [address], [place], [place] [address]    Gram Stain   Final    NO WBC SEEN RARE GRAM POSITIVE COCCI IN PAIRS IN CLUSTERS    Culture   Final    FEW STAPHYLOCOCCUS  AUREUS NO ANAEROBES ISOLATED Performed at [facility], [place] [address] [place], [place] [address]    Report Status 05/08/2020 FINAL  Final   Organism ID, Bacteria STAPHYLOCOCCUS AUREUS  Final      Susceptibility   Staphylococcus aureus - MIC*     CIPROFLOXACIN <=0.5 SENSITIVE Sensitive     ERYTHROMYCIN >=8 RESISTANT Resistant     GENTAMICIN <=0.5 SENSITIVE Sensitive     OXACILLIN 0.5 SENSITIVE Sensitive     TETRACYCLINE <=1 SENSITIVE Sensitive     VANCOMYCIN 1 SENSITIVE Sensitive     TRIMETH/SULFA <=10 SENSITIVE Sensitive     CLINDAMYCIN <=0.25 SENSITIVE Sensitive     RIFAMPIN <=0.5 SENSITIVE Sensitive     Inducible Clindamycin NEGATIVE Sensitive     * FEW STAPHYLOCOCCUS AUREUS  Fungus Culture Result     Status: None   Collection Time: 05/03/20  9:38 AM  Result Value Ref Range Status   Result 1 Comment  Final    Comment: (NOTE) KOH/Calcofluor preparation:  no fungus observed. Performed At: [facility] [address] [place], [place] [id number] Rush Farmer MD [id number]   Culture, blood (Routine X 2) w Reflex to ID Panel     Status: None   Collection Time: 05/04/20  5:19 PM   Specimen: BLOOD  Result Value Ref Range Status   Specimen Description   Final    BLOOD RIGHT ANTECUBITAL Performed at [facility] [address] [place], [place] [address]    Special Requests   Final    BOTTLES DRAWN AEROBIC AND ANAEROBIC Blood Culture adequate volume Performed at [facility] [address] [place], [place] [address]    Culture   Final    NO GROWTH 5 DAYS Performed at [facility], [place] [address], [place], [place] [address]    Report Status 05/09/2020 FINAL  Final  Culture, blood (Routine X 2) w Reflex to ID Panel     Status: None   Collection Time: 05/04/20  5:19 PM   Specimen: BLOOD  Result Value Ref Range Status   Specimen Description   Final    BLOOD LEFT ANTECUBITAL Performed at [facility] [address], [place], [place] [address]    Special Requests   Final    BOTTLES DRAWN AEROBIC ONLY Blood Culture adequate volume Performed at [facility] [address], [place], [place] [address]    Culture   Final    NO  GROWTH 5 DAYS Performed at [facility], [place] [address], [place], [place] [address]    Report Status 05/09/2020 FINAL  Final  Fungus Culture With Stain     Status: None (Preliminary result)   Collection Time: 05/10/20  9:10 AM   Specimen: Soft Tissue, Other  Result Value Ref Range Status   Fungus Stain Final report  Final    Comment: (NOTE) Performed At: [facility] [place], [place] [id number] Rush Farmer MD [id number]    Fungus (Mycology) Culture PENDING  Incomplete   Fungal Source TRICUSPID VALVE VEGETATION  Final    Comment: Performed at [facility], [place] [address], [place], [place] [address]  Aerobic/Anaerobic Culture (surgical/deep wound)     Status: None (Preliminary result)   Collection Time: 05/10/20  9:10 AM   Specimen: Soft Tissue, Other  Result Value Ref Range Status   Specimen Description TISSUE  Final   Special Requests TRICUSPID VALVE VEGETATION  Final  Gram Stain   Final    RARE WBC PRESENT, PREDOMINANTLY PMN ABUNDANT GRAM POSITIVE COCCI IN PAIRS IN CLUSTERS Performed at [facility], [place] [address], [place], [place] [address]    Culture   Final    ABUNDANT METHICILLIN RESISTANT STAPHYLOCOCCUS AUREUS NO ANAEROBES ISOLATED; CULTURE IN PROGRESS FOR 5 DAYS    Report Status PENDING  Incomplete   Organism ID, Bacteria METHICILLIN RESISTANT STAPHYLOCOCCUS AUREUS  Final      Susceptibility   Methicillin resistant staphylococcus aureus - MIC*    CIPROFLOXACIN <=0.5 SENSITIVE Sensitive     ERYTHROMYCIN >=8 RESISTANT Resistant     GENTAMICIN <=0.5 SENSITIVE Sensitive     OXACILLIN >=4 RESISTANT Resistant     TETRACYCLINE <=1 SENSITIVE Sensitive     VANCOMYCIN 1 SENSITIVE Sensitive     TRIMETH/SULFA <=10 SENSITIVE Sensitive     CLINDAMYCIN <=0.25 SENSITIVE Sensitive     RIFAMPIN <=0.5 SENSITIVE Sensitive     Inducible Clindamycin NEGATIVE Sensitive     * ABUNDANT METHICILLIN RESISTANT STAPHYLOCOCCUS AUREUS  Acid Fast  Smear (AFB)     Status: None   Collection Time: 05/10/20  9:10 AM   Specimen: Soft Tissue, Other  Result Value Ref Range Status   AFB Specimen Processing Concentration  Final   Acid Fast Smear Negative  Final    Comment: (NOTE) Performed At: [facility] [address] [place], [place] [id number] Rush Farmer MD [id number]    Source (AFB) TRICUSPID VALVE VEGETATION  Final    Comment: Performed at [facility], [place] [address] [place], [place] [address]  Fungus Culture Result     Status: None   Collection Time: 05/10/20  9:10 AM  Result Value Ref Range Status   Result 1 Comment  Final    Comment: (NOTE) KOH/Calcofluor preparation:  no fungus observed. Performed At: [facility] [place], [place] [id number] Rush Farmer MD [id number]   Culture, respiratory (non-expectorated)     Status: None (Preliminary result)   Collection Time: 05/13/20  6:05 PM   Specimen: Tracheal Aspirate; Respiratory  Result Value Ref Range Status   Specimen Description TRACHEAL ASPIRATE  Final   Special Requests NONE  Final   Gram Stain   Final    NO WBC SEEN FEW GRAM POSITIVE COCCI Performed at [facility], [place] [address], [place], [place] [address]    Culture FEW STAPHYLOCOCCUS AUREUS  Final   Report Status PENDING  Incomplete  Culture, blood (routine x 2)     Status: None (Preliminary result)   Collection Time: 05/13/20  6:21 PM   Specimen: BLOOD  Result Value Ref Range Status   Specimen Description BLOOD LEFT ANTECUBITAL  Final   Special Requests   Final    BOTTLES DRAWN AEROBIC AND ANAEROBIC Blood Culture adequate volume   Culture   Final    NO GROWTH < 12 HOURS Performed at [facility], [place] [address] [place], [place] [address]    Report Status PENDING  Incomplete  Culture, blood (routine x 2)     Status: None (Preliminary result)   Collection Time: 05/13/20  6:30 PM   Specimen: BLOOD LEFT FOREARM  Result Value Ref Range Status    Specimen Description BLOOD LEFT FOREARM  Final   Special Requests   Final    BOTTLES DRAWN AEROBIC AND ANAEROBIC Blood Culture adequate volume   Culture   Final    NO GROWTH < 12 HOURS Performed at  [facility], [place] [address] [place], [place] [address]    Report Status PENDING  Incomplete  MRSA PCR Screening     Status: Abnormal   Collection Time: 05/13/20  7:45 PM   Specimen: Nasal Mucosa; Nasopharyngeal  Result Value Ref Range Status   MRSA by PCR POSITIVE (A) NEGATIVE Final    Comment:        The GeneXpert MRSA Assay (FDA approved for NASAL specimens only), is one component of a comprehensive MRSA colonization surveillance program. It is not intended to diagnose MRSA infection nor to guide or monitor treatment for MRSA infections. RESULT CALLED TO, READ BACK BY AND VERIFIED WITH: Bailey Mech RN 05/13/20 2117 JDW Performed at [facility] [address] [place], [place] [address]     Studies/Results: DG Abd 1 View  Result Date: 05/13/2020 CLINICAL DATA:  Orogastric tube placement. EXAM: ABDOMEN - 1 VIEW COMPARISON:  None. FINDINGS: A nasogastric tube is seen with its distal tip overlying the expected region of the gastric antrum. The visualized loops of large and small bowel are normal in caliber. A large amount of stool is seen within the ascending and proximal transverse colon. No radio-opaque calculi are seen. Radiopaque contrast is noted within the urinary bladder. IMPRESSION: Nasogastric tube positioning, as described above. Electronically Signed   By: Virgina Norfolk M.D.   On: 05/13/2020 20:45   CT ANGIO CHEST PE W OR WO CONTRAST  Result Date: 05/13/2020 CLINICAL DATA:  Possible pulmonary embolism. EXAM: CT ANGIOGRAPHY CHEST WITH CONTRAST TECHNIQUE: Multidetector CT imaging of the chest was performed using the standard protocol during bolus administration of intravenous contrast. Multiplanar CT image reconstructions and MIPs were obtained to evaluate the  vascular anatomy. CONTRAST:  180m OMNIPAQUE IOHEXOL 350 MG/ML SOLN COMPARISON:  04/28/2020 FINDINGS: Cardiovascular: Mild cardiomegaly. Thoracic aorta is normal in caliber. Pulmonary arterial system is well opacified without evidence of emboli. Subtle low-density at a bifurcation point of a posterior right lower lobar pulmonary artery and likely bifurcation artifact not emboli. Remaining vascular structures are unremarkable. Mediastinum/Nodes: 1.2 cm precarinal lymph node likely reactive. 1.2 cm right paratracheal lymph node. 1.2 cm left hilar lymph node and 1.4 cm right hilar lymph node likely reactive. Remaining mediastinal structures are unremarkable. Lungs/Pleura: Lungs are adequately inflated demonstrate evidence of patient's known numerous cavitary and non cavitary nodular opacities. There is been overall progression of this airspace process with worsening more confluent airspace opacification over the right upper and lower lobes worsening small right pleural effusion and tiny amount left pleural fluid. Minimal fluid over the major fissures. Airways are otherwise normal. Upper Abdomen: No acute findings. Musculoskeletal: Unchanged. Review of the MIP images confirms the above findings. IMPRESSION: 1. Interval progression of numerous bilateral cavitary and non cavitary nodular opacities. Worsening confluent airspace opacification over the right upper and lower lobes. Slight worsening small amount of bilateral pleural fluid right worse than left. Minimal mediastinal/hilar adenopathy likely reactive. Findings likely due to infection and possible septic emboli. 2.  No evidence of pulmonary emboli. 3.  Mild cardiomegaly. Electronically Signed   By: DMarin OlpM.D.   On: 05/13/2020 17:05   DG CHEST PORT 1 VIEW  Result Date: 05/14/2020 CLINICAL DATA:  Endotracheal tube position EXAM: PORTABLE CHEST 1 VIEW COMPARISON:  05/13/2020 FINDINGS: Endotracheal tube tip is below the level of the clavicular heads by  approximately 2 cm. It is well above the carina. There are persistent opacities in the lower right lung. No pleural effusion or  pneumothorax. Esophageal tube courses below the field of view. Right subclavian approach catheter tip projects within the proximal right atrium. Left subclavian catheter tip projects in the lower SVC. IMPRESSION: 1. Endotracheal tube tip between thoracic inlet and carina. 2. Persistent right lower lung opacities. Electronically Signed   By: Ulyses Jarred M.D.   On: 05/14/2020 01:12   DG CHEST PORT 1 VIEW  Result Date: 05/13/2020 CLINICAL DATA:  Endotracheal intubation. EXAM: PORTABLE CHEST 1 VIEW COMPARISON:  CTA chest 05/13/2020. One-view chest x-ray 05/13/2020 at 8:12 a.m. FINDINGS: The patient has been intubated. Endotracheal tube extends into the right mainstem bronchus should be pulled back at least 4 cm for more optimal positioning. Right-sided PICC line terminates at the cavoatrial junction and is stable. Left subclavian line terminates in the mid SVC. Patchy bilateral airspace disease is stable, right greater than left. The stomach is distended with gas. IMPRESSION: 1. The endotracheal tube extends into the right mainstem bronchus. The tube should be pulled back at least 4 cm for more optimal positioning. 2. Stable right-sided PICC line and left subclavian line. 3. Stable bilateral airspace disease, right greater than left. These results will be called to the ordering clinician or representative by the Radiologist Assistant, and communication documented in the PACS or [employer]. Electronically Signed   By: San Morelle M.D.   On: 05/13/2020 17:58   DG CHEST PORT 1 VIEW  Result Date: 05/13/2020 CLINICAL DATA:  Chest pain.  History of endocarditis. EXAM: PORTABLE CHEST 1 VIEW COMPARISON:  05/10/2020 FINDINGS: The left jugular central venous catheter has been removed. A right PICC has been placed and terminates over the high right atrium. The cardiomediastinal  silhouette is unchanged. Widespread patchy and nodular opacities throughout the right greater than left lungs have not significantly changed. Mild underlying interstitial densities are also again noted. A small right pleural effusion is questioned. No pneumothorax is identified. IMPRESSION: 1. Interval right PICC placement. 2. Unchanged bilateral lung opacities which may reflect septic emboli and pneumonia as well as possible underlying edema. Electronically Signed   By: Logan Bores M.D.   On: 05/13/2020 08:30   [place] EKG SITE RITE  Result Date: 05/12/2020 If Site Rite image not attached, placement could not be confirmed due to current cardiac rhythm.  CT Angio Abd/Pel w/ and/or w/o  Result Date: 05/13/2020 CLINICAL DATA:  Acute mesenteric ischemia EXAM: CTA ABDOMEN AND PELVIS WITHOUT AND WITH CONTRAST TECHNIQUE: Multidetector CT imaging of the abdomen and pelvis was performed using the standard protocol during bolus administration of intravenous contrast. Multiplanar reconstructed images and MIPs were obtained and reviewed to evaluate the vascular anatomy. CONTRAST:  30m OMNIPAQUE IOHEXOL 350 MG/ML SOLN COMPARISON:  04/28/2020 FINDINGS: VASCULAR Aorta: Normal Celiac: Replaced left hepatic artery. Common hepatic artery gives rise only to the segment 4 hepatic artery. Celiac axis otherwise unremarkable. SMA: Replaced right hepatic artery.  Otherwise unremarkable. Renals: Single renal arteries bilaterally with almost immediate takeoff of a superior polar branch of the left renal artery. Renal arteries are widely patent. IMA: Normal Inflow: Normal Proximal Outflow: Normal Veins: Not opacified, however, morphologically normal. Review of the MIP images confirms the above findings. NON-VASCULAR Lower chest: When compared to prior examination, there is increasing cavitation and consolidation within the lung bases related to the patient's known septic embolization. Small right pleural effusion has developed. Cardiac  size is mildly enlarged for age. Hepatobiliary: Liver and gallbladder are unremarkable. Pancreas: Unremarkable Spleen: Borderline splenomegaly is stable Adrenals/Urinary Tract: Adrenal glands and kidneys are  unremarkable. The bladder is mildly distended. Tiny focus of gas within the bladder lumen non dependently may relate to recent catheterization. Stomach/Bowel: Nasogastric tube extends into the third portion of the duodenum. Multiple gas-filled loops of large and small bowel are again identified suggesting a mild ileus. Spine moderate stool noted throughout the colon. No pneumatosis no free intraperitoneal gas. Lymphatic: No pathologic adenopathy within the abdomen and pelvis. Reproductive: Unremarkable Other: There is mild edema within the retroperitoneum, particularly within the perirectal and presacral soft tissues. There is extensive subcutaneous edema identified, particularly within the flanks bilaterally. Gas within the left lower quadrant anterior abdominal wall subcutaneous fat may relate to subcutaneous injection. Musculoskeletal: No acute bone abnormality. IMPRESSION: VASCULAR Normal examination. NON-VASCULAR Marked interval progression of bibasilar cavitation and consolidation related to the patient's septic embolization. New small right pleural effusion. Progressive anasarca. Nasogastric tube extends into the third portion of the duodenum. Electronically Signed   By: Fidela Salisbury MD   On: 05/13/2020 22:33      Assessment/Plan:  INTERVAL HISTORY: His respiratory failure yesterday progressed and required mechanical intubation.  He is now on pressors and febrile in the intensive care unit.    Principal Problem:   Endocarditis of tricuspid valve Active Problems:   Intravenous drug abuse (HCC)   Septic embolism (HCC)   Sepsis (HCC)   Thrombocytopenia (HCC)   Hypokalemia   Hyponatremia   MRSA bacteremia   Malnutrition of moderate degree   Threatening behavior   Hand abscess    Elevated BUN   Pressure injury of skin   Chest pain    Clifford Clark is a 29 y.o. 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  He now has some neck pain where his central line is as well as new onset headache.  1.  MSSA bacteremia with tricuspid valve endocarditis and septic emboli: He underwent angio VAC.  Cultures from his valve yielded not just MSSA which grew phenotypically but also MRSA which actually had been identified initially on [facility] ID  We have changed him to vancomycin but in the interval he has deteriorated and was profoundly tachypneic yesterday.  CT scan of his lungs revealed progressive cavitary pathology related to his septic embolization.  He did not have any pulmonary emboli.  He was broadened with meropenem on top of the vancomycin he has been receiving.  Tracheal cultures were taken and have already started to grow Staph aureus.  He had a CT of the abdomen done which does not show any intra-abdominal pathology.  I believe that MSSA and MRSA in explained the entirety of his pathology right now.  I worry with his high fevers that he potentially may have become bacteremic again and seeded his central lines.  He is going to need a protracted course of therapy directed at [facility] but also the MRSA  #2 cavitary pneumonia this is all MSSA and MRSA pathology.  I do not see need for gram-negative coverage and I am discontinuing his meropenem   3.  Back pain: MRI did not show evidence of discitis in the thoracic or lumbar area but could have infection there and just not be showing up on MRI yet.  4.  For IV drug use: He will need plan for addressing this root cause   LOS: 16 days   Alcide Evener 05/14/2020, 11:54 AM

## 2020-05-14 NOTE — Progress Notes (Signed)
Oral temperature at 0551 101.7 and ice packs applied to the patient. However, patient increasingly tachycardic and tachypneic. Elink called and recommended giving the higher dose of fentanyl at . Fentanyl given. Will reassess patient per CPOT protocol.

## 2020-05-14 NOTE — Progress Notes (Addendum)
Patient transported to and from CT with RN and RT. No significant events occurred during transport.   Patient returned to 2H23.

## 2020-05-14 NOTE — Progress Notes (Signed)
[name], mother called to be updated. Events from the night discussed with her. She was thankful for the update.

## 2020-05-14 NOTE — Progress Notes (Signed)
Pharmacy Antibiotic Note  Clifford Clark is a 29 y.o. 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 Pharmacy has been consulted for vancomycin dosing.    WBC 15.7, Afebrile. HR 100s. Renal function stable, good UOP.   7/24 VT - 14 (Drawn appropriately, goal 15-20) Will continue current dose given close to goal, at risk for accumulation and anticipated 6+ week course.  Plan: Continue Vancomycin 750 IV q8hr Monitor cultures, clinical status, renal fx, f/u duration  F/u weekly vancomycin levels or sooner if renal function changes- next due 7/30   Height: 5\' 7"  (170.2 cm) Weight: 55 kg (121 lb 4.1 oz) IBW/kg (Calculated) : 66.1  Temp (24hrs), Avg:100 F (37.8 C), Min:97.2 F (36.2 C), Max:103.1 F (39.5 C)  Recent Labs  Lab 05/09/20 0307 05/12/20 0500 05/13/20 0839 05/13/20 1834 05/13/20 2050 05/14/20 1617  WBC 14.7* 13.3* 17.8* 15.7*  --   --   CREATININE 0.73 0.74 0.70 0.71  --  0.64  LATICACIDVEN  --   --   --  1.5 1.2  --   VANCOTROUGH  --   --   --   --   --  14*    Estimated Creatinine Clearance: 106 mL/min (by C-G formula based on SCr of 0.64 mg/dL).    No Known Allergies  Antimicrobials this admission: cefepime 7/8 >> 7/9 vancomycin 7/8 >> 7/15, 7/22>> Metronidazole 7/8 >> 7/9  Cefazolin 7/16>>7/22 Meropenem 7/23>>7/24  Microbiology results: 7/8 BCx: BCID resulted 4/4 MRSA, BCx resulting as MSSA 7/8 UCx:  >100,000 colonies MSSA  7/8 MRSA PCR: positive  7/8 COVID-19 negative  7/8 HIV:  negative  7/8 hepatitis panel: hep C Ab positive 7/9: HCV RNA quant: not elevated  7/10 rpt BCx: NGTD  7/13 R WristCx & FungalCx: rare MSSA 7/13 L WristCx & FungalCx: few MSSA 7/14: Bcx NG 7/20 valve tissue: MRSA  7/23: trach aspirate : few Staph aureus 7/23 BCx NGTD 7/23 MRSA +    Thank you for allowing pharmacy to be a part of this patient's care.  8/23, PharmD, BCPS, BCCP Clinical Pharmacist  Please check AMION for all  [facility] phone numbers After 10:00 PM, call Main Pharmacy [phone]

## 2020-05-15 DIAGNOSIS — R509 Fever, unspecified: Secondary | ICD-10-CM

## 2020-05-15 LAB — CBC
HCT: 24.3 % — ABNORMAL LOW (ref 39.0–52.0)
Hemoglobin: 8 g/dL — ABNORMAL LOW (ref 13.0–17.0)
MCH: 29.6 pg (ref 26.0–34.0)
MCHC: 32.9 g/dL (ref 30.0–36.0)
MCV: 90 fL (ref 80.0–100.0)
Platelets: 128 10*3/uL — ABNORMAL LOW (ref 150–400)
RBC: 2.7 MIL/uL — ABNORMAL LOW (ref 4.22–5.81)
RDW: 15.2 % (ref 11.5–15.5)
WBC: 16.9 10*3/uL — ABNORMAL HIGH (ref 4.0–10.5)
nRBC: 0.1 % (ref 0.0–0.2)

## 2020-05-15 LAB — TRIGLYCERIDES: Triglycerides: 186 mg/dL — ABNORMAL HIGH (ref <150)

## 2020-05-15 LAB — GLUCOSE, CAPILLARY
Glucose-Capillary: 116 mg/dL — ABNORMAL HIGH (ref 70–99)
Glucose-Capillary: 130 mg/dL — ABNORMAL HIGH (ref 70–99)
Glucose-Capillary: 104 mg/dL — ABNORMAL HIGH (ref 70–99)
Glucose-Capillary: 96 mg/dL (ref 70–99)
Glucose-Capillary: 91 mg/dL (ref 70–99)

## 2020-05-15 LAB — PHOSPHORUS
Phosphorus: 3 mg/dL (ref 2.5–4.6)
Phosphorus: 2.3 mg/dL — ABNORMAL LOW (ref 2.5–4.6)

## 2020-05-15 LAB — CULTURE, RESPIRATORY W GRAM STAIN: Gram Stain: NONE SEEN

## 2020-05-15 LAB — AEROBIC/ANAEROBIC CULTURE W GRAM STAIN (SURGICAL/DEEP WOUND)

## 2020-05-15 LAB — COMPREHENSIVE METABOLIC PANEL
ALT: 25 U/L (ref 0–44)
AST: 20 U/L (ref 15–41)
Albumin: 1.3 g/dL — ABNORMAL LOW (ref 3.5–5.0)
Alkaline Phosphatase: 98 U/L (ref 38–126)
Anion gap: 9 (ref 5–15)
BUN: 21 mg/dL — ABNORMAL HIGH (ref 6–20)
CO2: 24 mmol/L (ref 22–32)
Calcium: 7.3 mg/dL — ABNORMAL LOW (ref 8.9–10.3)
Chloride: 106 mmol/L (ref 98–111)
Creatinine, Ser: 0.59 mg/dL — ABNORMAL LOW (ref 0.61–1.24)
GFR calc Af Amer: >60 mL/min (ref >60)
GFR calc non Af Amer: >60 mL/min (ref >60)
Glucose, Bld: 136 mg/dL — ABNORMAL HIGH (ref 70–99)
Potassium: 3.6 mmol/L (ref 3.5–5.1)
Sodium: 139 mmol/L (ref 135–145)
Total Bilirubin: 0.5 mg/dL (ref 0.3–1.2)
Total Protein: 5.7 g/dL — ABNORMAL LOW (ref 6.5–8.1)

## 2020-05-15 LAB — PROTIME-INR
INR: 1.4 — ABNORMAL HIGH (ref 0.8–1.2)
Prothrombin Time: 16.5 seconds — ABNORMAL HIGH (ref 11.4–15.2)

## 2020-05-15 LAB — MAGNESIUM
Magnesium: 2.1 mg/dL (ref 1.7–2.4)
Magnesium: 2.1 mg/dL (ref 1.7–2.4)

## 2020-05-15 MED ORDER — FUROSEMIDE 10 MG/ML IJ SOLN: 60.0000 mg | Freq: Two times a day (BID) | INTRAMUSCULAR | Status: DC

## 2020-05-15 NOTE — Progress Notes (Signed)
NAME:  Clifford Clark, MRN:  [id number], DOB:  08/13/1991, LOS: 17 ADMISSION DATE:  04/28/2020, CONSULTATION DATE:  05/13/2020 REFERRING MD:  Albertine Grates CHIEF COMPLAINT:  Endocarditis  Brief History   29 yo male with PMH significant for IVDA (fentanyl), asthma admitted with tricuspid valve endocarditis, MRSA bacteremia.   History of present illness   Patient presented to ED on 7/08 after being found on the ground by a friend.  His work up revealed Tricuspid valve endocarditis with MRSA bacteremia. He was also found to have septic emboli to kidneys and lungs. CT of hands showed bilateral abscesses and underwent I&D on 7/13. 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 Patient was found to have increasing tachycardia and tachypnea today.     Patient is negative 4 L to date and negative 700 ml on I/O.  T max 103. A CTA Chest was completed, post imaging, patient returned to room and found to have respiratory rate in 50's.  CCM consulted and patient transferred to ICU for intubation and mechanical ventilation. Post intubation, patient did require levo during intubation.    Past Medical History  IVDA --Fentanyl Asthma  Significant Hospital Events   7/08: Admitted 7/13: I&D of bilateral and abscesses  Consults:  ID Orthopedics CVTS  Psych PCCM  Procedures:  7/13: I&D of bilateral and abscesses 7/20: Debridement of right atrial mass and debridement of tricuspid valve vegetation 7/22: PICC line placed 7/23: CVC 7/23: ETT 7/24:   Significant Diagnostic Tests:  7/8: Abdominal U/S: Hepatomegaly 7/16: TEE: LVEF 60-65%, RV normal, left atrial thrombus, small pericardial effusion, MVR. Tricuspid valve vegetation.  Pulmonic valve vegetation. 7/21: [place] of LUE without evidence of superficial or deep vein thrombosis  7/23: CTA chest without PE, multifocal PNA and septic emboli -worsening compared to previous  studies. 7/24: echo shows worsening cardiomyopathy.   Micro Data:  7/8 Blood Cx: MRSA Bacteremia 7/13: Blood Cx: MRSA Bacteremia 7/13: Fungal Cx: NGTD 7/20: Blood Cx:  MRSA Bacteremia 7/23 Blood Cx: >> 7/23 respiratory Cx >>  Antimicrobials:  Vanc Mero 7/23  Interim history/subjective:  Calm but interactive.  Norepinephrine weaned off.  Objective   Blood pressure 92/75, pulse (!) 115, temperature 98.8 F (37.1 C), temperature source Oral, resp. rate (!) 30, height 5\' 7"  (1.702 m), weight 65.3 kg, SpO2 100 %. CVP:  [4 mmHg-14 mmHg] 8 mmHg  Vent Mode: PRVC FiO2 (%):  [40 %-41 %] 40 % Set Rate:  [24 bmp] 24 bmp Vt Set:  [390 mL] 390 mL PEEP:  [5 cmH20] 5 cmH20   Intake/Output Summary (Last 24 hours) at 05/15/2020 1634 Last data filed at 05/15/2020 1615 Gross per 24 hour  Intake 2906.65 ml  Output 1825 ml  Net 1081.65 ml   Filed Weights   04/28/20 2130 05/15/20 0530  Weight: 55 kg 65.3 kg     11.1    CO                   CI              6.8    CI    SV              91    SV    SVR              538    SVR    SVRI  747    SVRI     Examination: General: Adult male, severe distress, thin. HENT: Orally intubated.  OG tube in place.  Minimal OG output. Lungs: Mechanically ventilated.  Diffuse rhonchi right side.  Decreased breath sounds at both bases. Cardiovascular: Extremities warm.  Heart sounds unremarkable. Abdomen: Soft and nontender. Extremities: no deformities Neuro: Sedated.  Comfortable.  Follows commands moves all extremities.  Resolved Hospital Problem list   AKI Elevated LFTs  Assessment & Plan:  Critically ill due to acute hypoxic respiratory failure requiring mechanical ventilation.  Cause for respiratory decompensation as yet unclear.  Differential: Further embolization. -Continue full ventilatory support. -Initiate SBT's in a.m., however uncertain if we have corrected underlying cause for decompensation.  Multifocal pneumonia with  cavitary lesions consistent with embolization from known MRSA tricuspid valve endocarditis. -Continue vancomycin -No role for adjunctive therapy.  Critically ill due to septic shock, improved hemodynamics. 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 -ID following -Continue vancomycin -Limited therapeutic options beyond antibiotic therapy at this point  Daily Goals Checklist  Pain/Anxiety/Delirium protocol (if indicated): adding fentanyl for pain control.  Reduced dose propofol may help blood pressure. VAP protocol (if indicated): Bundle in place Respiratory support goals: Full ventilatory support.  Initiate SBT in a.m. Blood pressure target: Titrate to keep MAP greater than 65 DVT prophylaxis: Enoxaparin Nutrition Status: High nutritional risk, initiate enteral feeds GI prophylaxis: Pantoprazole Fluid status goals: Allow autoregulation in positive fluid balance Urinary catheter: External catheter Central lines: Right arm single-lumen PICC, left subclavian triple-lumen. Glucose control: Euglycemic.  No history of diabetes. Mobility/therapy needs: Bedrest Antibiotic de-escalation: Continue vancomycin. Home medication reconciliation: On hold. Daily labs: CBC, BMP Code Status: Full code Family Communication: We will update family Disposition: ICU   Labs   CBC: Recent Labs  Lab 05/09/20 0307 05/10/20 0802 05/12/20 0500 05/13/20 0839 05/13/20 1815 05/13/20 1834 05/15/20 0414  WBC 14.7*  --  13.3* 17.8*  --  15.7* 16.9*  NEUTROABS  --   --   --  14.9*  --   --   --   HGB 7.2*   < > 8.4* 8.1* 9.5* 8.5* 8.0*  HCT 22.8*   < > 25.6* 24.5* 28.0* 26.2* 24.3*  MCV 88.4  --  89.2 88.8  --  90.0 90.0  PLT 178  --  145* 124*  --  123* 128*   < > = values in this interval not displayed.    Basic Metabolic Panel: Recent Labs  Lab  05/12/20 0500 05/12/20 0500 05/13/20 0839 05/13/20 1815 05/13/20 1834 05/14/20 1617 05/15/20 0414  NA 133*   < > 132* 133* 131* 137 139  K 4.2   < > 4.1 4.2 4.1 3.6 3.6  CL 101  --  97*  --  98 104 106  CO2 25  --  27  --  24 25 24   GLUCOSE 107*  --  117*  --  163* 120* 136*  BUN 10  --  8  --  13 19 21*  CREATININE 0.74  --  0.70  --  0.71 0.64 0.59*  CALCIUM 7.4*  --  7.5*  --  7.4* 7.1* 7.3*  MG 1.8  --  1.8  --  1.9 2.0 2.1  PHOS  --   --   --   --  4.1 3.8 3.0   < > = values  in this interval not displayed.   GFR: Estimated Creatinine Clearance: 125.8 mL/min (A) (by C-G formula based on SCr of 0.59 mg/dL (L)). Recent Labs  Lab 05/12/20 0500 05/13/20 0839 05/13/20 1834 05/13/20 2050 05/15/20 0414  WBC 13.3* 17.8* 15.7*  --  16.9*  LATICACIDVEN  --   --  1.5 1.2  --     Liver Function Tests: Recent Labs  Lab 05/09/20 0307 05/12/20 0500 05/13/20 0839 05/13/20 1834 05/15/20 0414  AST 25 105* 37 39 20  ALT 16 83* 40 41 25  ALKPHOS 69 124 110 120 98  BILITOT 0.5 0.6 0.6 0.8 0.5  PROT 5.6* 5.8* 6.2* 6.3* 5.7*  ALBUMIN 1.4* 1.5* 1.4* 1.5* 1.3*   No results for input(s): LIPASE, AMYLASE in the last 168 hours. No results for input(s): AMMONIA in the last 168 hours.  ABG    Component Value Date/Time   PHART 7.369 05/13/2020 1815   PCO2ART 45.9 05/13/2020 1815   PO2ART 137 (H) 05/13/2020 1815   HCO3 26.4 05/13/2020 1815   TCO2 28 05/13/2020 1815   ACIDBASEDEF 0.6 05/06/2020 2215   O2SAT 71.9 05/14/2020 1707     Coagulation Profile: Recent Labs  Lab 05/15/20 0414  INR 1.4*    Cardiac Enzymes: No results for input(s): CKTOTAL, CKMB, CKMBINDEX, TROPONINI in the last 168 hours.  HbA1C: No results found for: HGBA1C  CBG: Recent Labs  Lab 05/14/20 2334 05/15/20 0354 05/15/20 0815 05/15/20 1201 05/15/20 1551  GLUCAP 120* 116* 130* 104* 96     CRITICAL CARE Performed by: Lynnell Catalan  Total critical care time: 35 minutes  Critical care time  was exclusive of separately billable procedures and treating other patients.  Critical care was necessary to treat or prevent imminent or life-threatening deterioration.  Critical care was time spent personally by me on the following activities: development of treatment plan with patient and/or surrogate as well as nursing, discussions with consultants, evaluation of patient's response to treatment, examination of patient, obtaining history from patient or surrogate, ordering and performing treatments and interventions, ordering and review of laboratory studies, ordering and review of radiographic studies, pulse oximetry, re-evaluation of patient's condition and participation in multidisciplinary rounds.  Lynnell Catalan, MD [facility] ICU Physician [facility]  Pager: [phone] Mobile: [phone] After hours: [phone].

## 2020-05-15 NOTE — Progress Notes (Signed)
Subjective:  He is frustrated being on the ventilator and is worried about how long he might have to be on it.  He would like better ability to communicate with other tools besides his magic marker and paper   Antibiotics:  Anti-infectives (From admission, onward)   Start     Dose/Rate Route Frequency Ordered Stop   05/13/20 1730  meropenem (MERREM) 1 g in sodium chloride 0.9 % 100 mL IVPB  Status:  Discontinued        1 g 200 mL/hr over 30 Minutes Intravenous Every 8 hours 05/13/20 1720 05/14/20 1154   05/12/20 1600  vancomycin (VANCOREADY) IVPB 750 mg/150 mL     Discontinue     750 mg 150 mL/hr over 60 Minutes Intravenous Every 8 hours 05/12/20 1509     05/05/20 1600  ceFAZolin (ANCEF) IVPB 2g/100 mL premix  Status:  Discontinued        2 g 200 mL/hr over 30 Minutes Intravenous Every 8 hours 05/05/20 1452 05/12/20 1509   05/04/20 1200  vancomycin (VANCOCIN) IVPB 1000 mg/200 mL premix  Status:  Discontinued        1,000 mg 200 mL/hr over 60 Minutes Intravenous Every 12 hours 05/04/20 1045 05/04/20 1055   05/04/20 1200  vancomycin (VANCOREADY) IVPB 750 mg/150 mL  Status:  Discontinued        750 mg 150 mL/hr over 60 Minutes Intravenous Every 8 hours 05/04/20 1055 05/05/20 1452   05/03/20 0945  vancomycin (VANCOCIN) powder  Status:  Discontinued          As needed 05/03/20 0945 05/03/20 1057   05/03/20 0920  ceFAZolin (ANCEF) 2-4 GM/100ML-% IVPB       Note to Pharmacy: Charmayne Sheer   : cabinet override      05/03/20 0920 05/03/20 2129   05/02/20 1500  vancomycin (VANCOREADY) IVPB 750 mg/150 mL  Status:  Discontinued        750 mg 150 mL/hr over 60 Minutes Intravenous Every 12 hours 05/02/20 1425 05/04/20 1045   05/01/20 1330  vancomycin variable dose per unstable renal function (pharmacist dosing)  Status:  Discontinued         Does not apply See admin instructions 05/01/20 1330 05/03/20 0810   04/29/20 1000  vancomycin (VANCOREADY) IVPB 750 mg/150 mL  Status:   Discontinued        750 mg 150 mL/hr over 60 Minutes Intravenous Every 8 hours 04/29/20 0834 05/01/20 1329   04/29/20 0400  metroNIDAZOLE (FLAGYL) IVPB 500 mg  Status:  Discontinued        500 mg 100 mL/hr over 60 Minutes Intravenous Every 8 hours 04/28/20 2122 04/29/20 0819   04/29/20 0400  ceFEPIme (MAXIPIME) 2 g in sodium chloride 0.9 % 100 mL IVPB  Status:  Discontinued        2 g 200 mL/hr over 30 Minutes Intravenous Every 8 hours 04/28/20 2138 04/29/20 0819   04/29/20 0100  vancomycin (VANCOCIN) IVPB 1000 mg/200 mL premix  Status:  Discontinued        1,000 mg 200 mL/hr over 60 Minutes Intravenous Every 8 hours 04/28/20 2146 04/29/20 0833   04/28/20 1715  ceFEPIme (MAXIPIME) 2 g in sodium chloride 0.9 % 100 mL IVPB        2 g 200 mL/hr over 30 Minutes Intravenous  Once 04/28/20 1708 04/28/20 1903   04/28/20 1715  metroNIDAZOLE (FLAGYL) IVPB 500 mg  500 mg 100 mL/hr over 60 Minutes Intravenous  Once 04/28/20 1708 04/28/20 2111   04/28/20 1715  vancomycin (VANCOCIN) IVPB 1000 mg/200 mL premix        1,000 mg 200 mL/hr over 60 Minutes Intravenous  Once 04/28/20 1708 04/28/20 1947      Medications: Scheduled Meds: . chlorhexidine gluconate (MEDLINE KIT)  15 mL Mouth Rinse BID  . Chlorhexidine Gluconate Cloth  6 each Topical Q0600  . docusate  100 mg Per Tube BID  . enoxaparin (LOVENOX) injection  40 mg Subcutaneous Q24H  . feeding supplement (PROSource TF)  45 mL Per Tube BID  . feeding supplement (VITAL HIGH PROTEIN)  1,000 mL Per Tube Q24H  . mouth rinse  15 mL Mouth Rinse 10 times per day  . multivitamin  15 mL Per Tube Daily  . mupirocin ointment  1 application Nasal BID  . pantoprazole sodium  20 mg Per Tube Daily  . polyethylene glycol  17 g Per Tube Daily  . Ensure Max Protein  11 oz Oral Daily  . sodium chloride flush  10-40 mL Intracatheter Q12H  . sodium chloride flush  3 mL Intravenous Q12H   Continuous Infusions: . sodium chloride    . fentaNYL infusion  INTRAVENOUS 125 mcg/hr (05/15/20 1200)  . norepinephrine (LEVOPHED) Adult infusion Stopped (05/15/20 1058)  . propofol (DIPRIVAN) infusion 30 mcg/kg/min (05/15/20 1234)  . vancomycin Stopped (05/15/20 1136)   PRN Meds:.acetaminophen (TYLENOL) oral liquid 160 mg/5 mL, acetaminophen **OR** acetaminophen, fentaNYL, ipratropium-albuterol, LORazepam, metoprolol tartrate, ondansetron **OR** ondansetron (ZOFRAN) IV, ondansetron, sodium chloride flush    Objective: Weight change:   Intake/Output Summary (Last 24 hours) at 05/15/2020 1336 Last data filed at 05/15/2020 1200 Gross per 24 hour  Intake 2991.8 ml  Output 2050 ml  Net 941.8 ml   Blood pressure 92/67, pulse (!) 110, temperature 98.6 F (37 C), temperature source Oral, resp. rate (!) 24, height '5\' 7"'  (1.702 m), weight 65.3 kg, SpO2 100 %. Temp:  [97.2 F (36.2 C)-99.9 F (37.7 C)] 98.6 F (37 C) (07/25 1203) Pulse Rate:  [94-117] 110 (07/25 1315) Resp:  [20-36] 24 (07/25 1315) BP: (75-117)/(54-78) 92/67 (07/25 1300) SpO2:  [98 %-100 %] 100 % (07/25 1315) Arterial Line BP: (89-122)/(51-77) 101/58 (07/25 1315) FiO2 (%):  [40 %-41 %] 40 % (07/25 1132) Weight:  [65.3 kg] 65.3 kg (07/25 0530)  Physical Exam: General: On the ventilator alert and oriented communicating through writing CVS tachycardic, could not appreciate murmurs Chest: Rhonchorous Abdomen: soft non-distended,  Extremities: He has some edema bilaterally Skin: no rashes Neuro: nonfocal  CBC:    BMET Recent Labs    05/14/20 1617 05/15/20 0414  NA 137 139  K 3.6 3.6  CL 104 106  CO2 25 24  GLUCOSE 120* 136*  BUN 19 21*  CREATININE 0.64 0.59*  CALCIUM 7.1* 7.3*     Liver Panel  Recent Labs    05/13/20 1834 05/15/20 0414  PROT 6.3* 5.7*  ALBUMIN 1.5* 1.3*  AST 39 20  ALT 41 25  ALKPHOS 120 98  BILITOT 0.8 0.5       Sedimentation Rate No results for input(s): ESRSEDRATE in the last 72 hours. C-Reactive Protein No results for  input(s): CRP in the last 72 hours.  Micro Results: Recent Results (from the past 720 hour(s))  Culture, blood (routine x 2)     Status: Abnormal   Collection Time: 04/28/20  3:45 PM   Specimen: BLOOD  Result Value Ref Range  Status   Specimen Description   Final    BLOOD LEFT ANTECUBITAL Performed at [facility] [address] [place], [place] [address]    Special Requests   Final    BOTTLES DRAWN AEROBIC AND ANAEROBIC Blood Culture adequate volume Performed at [facility] [address] [place], [place] [address]    Culture  Setup Time   Final    GRAM POSITIVE COCCI IN BOTH AEROBIC AND ANAEROBIC BOTTLES CRITICAL RESULT CALLED TO, READ BACK BY AND VERIFIED WITH: PHARMD M. Lear Ng [phone] FCP    Culture (A)  Final    STAPHYLOCOCCUS AUREUS METHICILLIN RESISTANT ORGANISM WAS NOT RECOVERED IN CULTURE. REPEATED FOR CONFIRMATION Performed at [facility], [place] [address] [place], [place] [address]    Report Status 05/03/2020 FINAL  Final   Organism ID, Bacteria STAPHYLOCOCCUS AUREUS  Final      Susceptibility   Staphylococcus aureus - MIC*    CIPROFLOXACIN <=0.5 SENSITIVE Sensitive     ERYTHROMYCIN >=8 RESISTANT Resistant     GENTAMICIN <=0.5 SENSITIVE Sensitive     OXACILLIN 0.5 SENSITIVE Sensitive     TETRACYCLINE <=1 SENSITIVE Sensitive     VANCOMYCIN <=0.5 SENSITIVE Sensitive     TRIMETH/SULFA <=10 SENSITIVE Sensitive     CLINDAMYCIN <=0.25 SENSITIVE Sensitive     RIFAMPIN <=0.5 SENSITIVE Sensitive     Inducible Clindamycin NEGATIVE Sensitive     * STAPHYLOCOCCUS AUREUS  Blood Culture ID Panel (Reflexed)     Status: Abnormal   Collection Time: 04/28/20  3:45 PM  Result Value Ref Range Status   Enterococcus species NOT DETECTED NOT DETECTED Final   Listeria monocytogenes NOT DETECTED NOT DETECTED Final   Staphylococcus species DETECTED (A) NOT DETECTED Final    Comment: CRITICAL RESULT CALLED TO, READ BACK BY AND VERIFIED  WITH: PHARMD Chales Abrahams [phone] FCP    Staphylococcus aureus (BCID) DETECTED (A) NOT DETECTED Final    Comment: Methicillin (oxacillin)-resistant Staphylococcus aureus (MRSA). MRSA is predictably resistant to beta-lactam antibiotics (except ceftaroline). Preferred therapy is vancomycin unless clinically contraindicated. Patient requires contact precautions if  hospitalized. CRITICAL RESULT CALLED TO, READ BACK BY AND VERIFIED WITH: PHARMD M. Lear Ng [phone] FCP    Methicillin resistance DETECTED (A) NOT DETECTED Final    Comment: CRITICAL RESULT CALLED TO, READ BACK BY AND VERIFIED WITH: PHARMD M. RENZ [id number] [phone] FCP    Streptococcus species NOT DETECTED NOT DETECTED Final   Streptococcus agalactiae NOT DETECTED NOT DETECTED Final   Streptococcus pneumoniae NOT DETECTED NOT DETECTED Final   Streptococcus pyogenes NOT DETECTED NOT DETECTED Final   Acinetobacter baumannii NOT DETECTED NOT DETECTED Final   Enterobacteriaceae species NOT DETECTED NOT DETECTED Final   Enterobacter cloacae complex NOT DETECTED NOT DETECTED Final   Escherichia coli NOT DETECTED NOT DETECTED Final   Klebsiella oxytoca NOT DETECTED NOT DETECTED Final   Klebsiella pneumoniae NOT DETECTED NOT DETECTED Final   Proteus species NOT DETECTED NOT DETECTED Final   Serratia marcescens NOT DETECTED NOT DETECTED Final   Haemophilus influenzae NOT DETECTED NOT DETECTED Final   Neisseria meningitidis NOT DETECTED NOT DETECTED Final   Pseudomonas aeruginosa NOT DETECTED NOT DETECTED Final   Candida albicans NOT DETECTED NOT DETECTED Final   Candida glabrata NOT DETECTED NOT DETECTED Final   Candida krusei NOT DETECTED NOT DETECTED Final   Candida parapsilosis NOT DETECTED NOT DETECTED Final   Candida tropicalis NOT DETECTED NOT DETECTED Final    Comment: Performed  at [facility], [place] [address], [place], [place] [address]  Culture, blood (routine x 2)     Status: Abnormal   Collection Time: 04/28/20  4:13 PM    Specimen: BLOOD  Result Value Ref Range Status   Specimen Description   Final    BLOOD RIGHT ANTECUBITAL Performed at [facility] [address] [place], [place] [address]    Special Requests   Final    BOTTLES DRAWN AEROBIC AND ANAEROBIC Blood Culture adequate volume Performed at [facility] [address] [place], [place] [address]    Culture  Setup Time   Final    GRAM POSITIVE COCCI IN BOTH AEROBIC AND ANAEROBIC BOTTLES CRITICAL VALUE NOTED.  VALUE IS CONSISTENT WITH PREVIOUSLY REPORTED AND CALLED VALUE.    Culture (A)  Final    STAPHYLOCOCCUS AUREUS METHICILLIN RESISTANT ORGANISM WAS NOT RECOVERED IN CULTURE. REPEATED FOR CONFIRMATION Performed at [facility], [place] [address] [place], [place] [address]    Report Status 05/03/2020 FINAL  Final   Organism ID, Bacteria STAPHYLOCOCCUS AUREUS  Final      Susceptibility   Staphylococcus aureus - MIC*    CIPROFLOXACIN <=0.5 SENSITIVE Sensitive     ERYTHROMYCIN >=8 RESISTANT Resistant     GENTAMICIN <=0.5 SENSITIVE Sensitive     OXACILLIN 0.5 SENSITIVE Sensitive     TETRACYCLINE <=1 SENSITIVE Sensitive     VANCOMYCIN <=0.5 SENSITIVE Sensitive     TRIMETH/SULFA <=10 SENSITIVE Sensitive     CLINDAMYCIN <=0.25 SENSITIVE Sensitive     RIFAMPIN <=0.5 SENSITIVE Sensitive     Inducible Clindamycin NEGATIVE Sensitive     * STAPHYLOCOCCUS AUREUS  Urine culture     Status: Abnormal   Collection Time: 04/28/20  7:00 PM   Specimen: In/Out Cath Urine  Result Value Ref Range Status   Specimen Description   Final    IN/OUT CATH URINE Performed at [facility] [address], [place], [place] [address]    Special Requests   Final    NONE Performed at [facility], [place] [address] [place], [place] [address]    Culture >=100,000 COLONIES/mL STAPHYLOCOCCUS AUREUS (A)  Final   Report Status 05/01/2020 FINAL  Final   Organism ID, Bacteria STAPHYLOCOCCUS  AUREUS (A)  Final      Susceptibility   Staphylococcus aureus - MIC*    CIPROFLOXACIN <=0.5 SENSITIVE Sensitive     GENTAMICIN <=0.5 SENSITIVE Sensitive     NITROFURANTOIN <=16 SENSITIVE Sensitive     OXACILLIN 0.5 SENSITIVE Sensitive     TETRACYCLINE <=1 SENSITIVE Sensitive     VANCOMYCIN <=0.5 SENSITIVE Sensitive     TRIMETH/SULFA <=10 SENSITIVE Sensitive     CLINDAMYCIN <=0.25 SENSITIVE Sensitive     RIFAMPIN <=0.5 SENSITIVE Sensitive     Inducible Clindamycin NEGATIVE Sensitive     * >=100,000 COLONIES/mL STAPHYLOCOCCUS AUREUS  SARS Coronavirus 2 by RT PCR (hospital order, performed in [place] hospital lab) Nasopharyngeal Nasopharyngeal Swab     Status: None   Collection Time: 04/28/20  7:03 PM   Specimen: Nasopharyngeal Swab  Result Value Ref Range Status   SARS Coronavirus 2 NEGATIVE NEGATIVE Final    Comment: (NOTE) SARS-CoV-2 target nucleic acids are NOT DETECTED.  The SARS-CoV-2 RNA is generally detectable in upper and lower respiratory specimens during the acute phase of infection. The lowest concentration of SARS-CoV-2 viral copies this assay can detect is 250 copies / mL. A negative result  does not preclude SARS-CoV-2 infection and should not be used as the sole basis for treatment or other patient management decisions.  A negative result may occur with improper specimen collection / handling, submission of specimen other than nasopharyngeal swab, presence of viral mutation(s) within the areas targeted by this assay, and inadequate number of viral copies (<250 copies / mL). A negative result must be combined with clinical observations, patient history, and epidemiological information.  Fact Sheet for Patients:   StrictlyIdeas.no  Fact Sheet for Healthcare Providers: BankingDealers.co.za  This test is not yet approved or  cleared by the [place] FDA and has been authorized for detection and/or diagnosis of  SARS-CoV-2 by FDA under an Emergency Use Authorization (EUA).  This EUA will remain in effect (meaning this test can be used) for the duration of the COVID-19 declaration under Section 564(b)(1) of the Act, 21 U.S.C. section 360bbb-3(b)(1), unless the authorization is terminated or revoked sooner.  Performed at [facility], [place] [address], [place], [place] [address]   MRSA PCR Screening     Status: Abnormal   Collection Time: 04/28/20  7:45 PM   Specimen: Nasal Mucosa; Nasopharyngeal  Result Value Ref Range Status   MRSA by PCR POSITIVE (A) NEGATIVE Final    Comment:        The GeneXpert MRSA Assay (FDA approved for NASAL specimens only), is one component of a comprehensive MRSA colonization surveillance program. It is not intended to diagnose MRSA infection nor to guide or monitor treatment for MRSA infections. RESULT CALLED TO, READ BACK BY AND VERIFIED WITH: S SMITH AT 2314 ON 04/28/2020 BY MOSLEY,J  Performed at [facility], [place] [address], [place], [place] [address]   Culture, blood (Routine X 2) w Reflex to ID Panel     Status: None   Collection Time: 04/30/20  8:15 AM   Specimen: BLOOD  Result Value Ref Range Status   Specimen Description   Final    BLOOD LEFT ANTECUBITAL Performed at [facility], [place] [address] [place], [place] [address]    Special Requests   Final    BOTTLES DRAWN AEROBIC AND ANAEROBIC Blood Culture adequate volume Performed at [facility] [address], [place], [place] [address]    Culture   Final    NO GROWTH 5 DAYS Performed at [facility], [place] [address], [place], [place] [address]    Report Status 05/05/2020 FINAL  Final  Culture, blood (Routine X 2) w Reflex to ID Panel     Status: None   Collection Time: 04/30/20  8:15 AM   Specimen: BLOOD LEFT FOREARM  Result Value Ref Range Status   Specimen Description   Final    BLOOD LEFT FOREARM Performed at [facility] [address], [place], [place] [address]    Special Requests   Final    BOTTLES DRAWN AEROBIC ONLY Blood Culture adequate volume Performed at [facility] [address] [place], [place] [address]    Culture   Final    NO GROWTH 5 DAYS Performed at [facility], [place] [address] [place], [place] [address]    Report Status 05/05/2020 FINAL  Final  Fungus Culture With Stain     Status: None (Preliminary result)   Collection Time: 05/03/20  9:35 AM   Specimen: Soft Tissue, Other  Result Value Ref Range Status   Fungus Stain Final report  Final  Comment: (NOTE) Performed At: [facility] [place], [place] [id number] Rush Farmer MD [id number]    Fungus (Mycology) Culture PENDING  Incomplete   Fungal Source TISSUE  Final    Comment: RT WRIST Performed at [facility], [place] [address], [place], [place] [address]   Aerobic/Anaerobic Culture (surgical/deep wound)     Status: None   Collection Time: 05/03/20  9:35 AM   Specimen: Soft Tissue, Other  Result Value Ref Range Status   Specimen Description   Final    TISSUE RT WRIST Performed at [facility] [address] [place], [place] [address]    Special Requests   Final    PODIATRY Derrek Monaco Performed at [place] [address], [place], [place] [address]    Gram Stain   Final    RARE WBC PRESENT,BOTH PMN AND MONONUCLEAR RARE GRAM POSITIVE COCCI IN PAIRS IN CLUSTERS    Culture   Final    RARE STAPHYLOCOCCUS AUREUS NO ANAEROBES ISOLATED Performed at [facility], [address] [place], [place] [address]    Report Status 05/08/2020 FINAL  Final   Organism ID, Bacteria STAPHYLOCOCCUS AUREUS  Final      Susceptibility   Staphylococcus aureus - MIC*    CIPROFLOXACIN <=0.5 SENSITIVE Sensitive     ERYTHROMYCIN >=8 RESISTANT Resistant     GENTAMICIN <=0.5 SENSITIVE Sensitive       OXACILLIN 0.5 SENSITIVE Sensitive     TETRACYCLINE <=1 SENSITIVE Sensitive     VANCOMYCIN <=0.5 SENSITIVE Sensitive     TRIMETH/SULFA <=10 SENSITIVE Sensitive     CLINDAMYCIN <=0.25 SENSITIVE Sensitive     RIFAMPIN <=0.5 SENSITIVE Sensitive     Inducible Clindamycin NEGATIVE Sensitive     * RARE STAPHYLOCOCCUS AUREUS  Fungus Culture Result     Status: None   Collection Time: 05/03/20  9:35 AM  Result Value Ref Range Status   Result 1 Comment  Final    Comment: (NOTE) KOH/Calcofluor preparation:  no fungus observed. Performed At: [facility] [place], [place] [id number] Rush Farmer MD [id number]   Fungus Culture With Stain     Status: None (Preliminary result)   Collection Time: 05/03/20  9:38 AM   Specimen: Soft Tissue, Other  Result Value Ref Range Status   Fungus Stain Final report  Final    Comment: (NOTE) Performed At: [facility] [address] [place], [place] [id number] Rush Farmer MD [id number]    Fungus (Mycology) Culture PENDING  Incomplete   Fungal Source TISSUE  Final    Comment: LT HAND Performed at [facility], [place] [address], [place], [place] [address]   Aerobic/Anaerobic Culture (surgical/deep wound)     Status: None   Collection Time: 05/03/20  9:38 AM   Specimen: Soft Tissue, Other  Result Value Ref Range Status   Specimen Description   Final    TISSUE LT HAND Performed at [place] [address] [place], [place] [address]    Special Requests   Final    PATIENT ON FOLLOWING ANCEF, [facility] Performed at [facility] [address] [place],  [address]    Gram Stain   Final    NO WBC SEEN RARE GRAM POSITIVE COCCI IN PAIRS IN CLUSTERS    Culture   Final    FEW STAPHYLOCOCCUS AUREUS NO ANAEROBES ISOLATED Performed at [facility], [place],  [place] [address]    Report Status 05/08/2020 FINAL  Final   Organism ID,  Bacteria STAPHYLOCOCCUS AUREUS  Final      Susceptibility   Staphylococcus aureus - MIC*    CIPROFLOXACIN <=0.5 SENSITIVE Sensitive     ERYTHROMYCIN >=8 RESISTANT Resistant     GENTAMICIN <=0.5 SENSITIVE Sensitive     OXACILLIN 0.5 SENSITIVE Sensitive     TETRACYCLINE <=1 SENSITIVE Sensitive     VANCOMYCIN 1 SENSITIVE Sensitive     TRIMETH/SULFA <=10 SENSITIVE Sensitive     CLINDAMYCIN <=0.25 SENSITIVE Sensitive     RIFAMPIN <=0.5 SENSITIVE Sensitive     Inducible Clindamycin NEGATIVE Sensitive     * FEW STAPHYLOCOCCUS AUREUS  Fungus Culture Result     Status: None   Collection Time: 05/03/20  9:38 AM  Result Value Ref Range Status   Result 1 Comment  Final    Comment: (NOTE) KOH/Calcofluor preparation:  no fungus observed. Performed At: [facility] [address] [place], [place] [id number] Rush Farmer MD [id number]   Culture, blood (Routine X 2) w Reflex to ID Panel     Status: None   Collection Time: 05/04/20  5:19 PM   Specimen: BLOOD  Result Value Ref Range Status   Specimen Description   Final    BLOOD RIGHT ANTECUBITAL Performed at [place] [address], [place], [place] [address]    Special Requests   Final    BOTTLES DRAWN AEROBIC AND ANAEROBIC Blood Culture adequate volume Performed at [facility] [address] [place], [place] [address]    Culture   Final    NO GROWTH 5 DAYS Performed at [facility], [place] [address] [place], [place] [address]    Report Status 05/09/2020 FINAL  Final  Culture, blood (Routine X 2) w Reflex to ID Panel     Status: None   Collection Time: 05/04/20  5:19 PM   Specimen: BLOOD  Result Value Ref Range Status   Specimen Description   Final    BLOOD LEFT ANTECUBITAL Performed at [facility] [address], [place], [place] [address]    Special Requests   Final    BOTTLES DRAWN AEROBIC ONLY Blood Culture adequate volume Performed at [facility] [address], [place], [place] [address]    Culture   Final    NO GROWTH 5 DAYS Performed at [facility], [place] [address] [place], [place] [address]    Report Status 05/09/2020 FINAL  Final  Fungus Culture With Stain     Status: None (Preliminary result)   Collection Time: 05/10/20  9:10 AM   Specimen: Soft Tissue, Other  Result Value Ref Range Status   Fungus Stain Final report  Final    Comment: (NOTE) Performed At: [facility] [place], [place] [id number] Rush Farmer MD [id number]    Fungus (Mycology) Culture PENDING  Incomplete   Fungal Source TRICUSPID VALVE VEGETATION  Final    Comment: Performed at [facility], [place] [address], [place], [place] [address]  Aerobic/Anaerobic Culture (surgical/deep wound)     Status: None   Collection Time: 05/10/20  9:10 AM   Specimen: Soft Tissue, Other  Result Value Ref Range Status   Specimen Description TISSUE  Final   Special Requests TRICUSPID VALVE VEGETATION  Final   Gram Stain   Final    RARE WBC PRESENT, PREDOMINANTLY PMN ABUNDANT GRAM POSITIVE COCCI  IN PAIRS IN CLUSTERS    Culture   Final    ABUNDANT METHICILLIN RESISTANT STAPHYLOCOCCUS AUREUS NO ANAEROBES ISOLATED Performed at [facility], [place] [address] [place], [place] [address]    Report Status 05/15/2020 FINAL  Final   Organism ID, Bacteria METHICILLIN RESISTANT STAPHYLOCOCCUS AUREUS  Final      Susceptibility   Methicillin resistant staphylococcus aureus - MIC*    CIPROFLOXACIN <=0.5 SENSITIVE Sensitive     ERYTHROMYCIN >=8 RESISTANT Resistant     GENTAMICIN <=0.5 SENSITIVE Sensitive     OXACILLIN >=4 RESISTANT Resistant     TETRACYCLINE <=1 SENSITIVE Sensitive     VANCOMYCIN 1 SENSITIVE Sensitive     TRIMETH/SULFA <=10 SENSITIVE Sensitive     CLINDAMYCIN <=0.25 SENSITIVE Sensitive     RIFAMPIN <=0.5 SENSITIVE Sensitive     Inducible Clindamycin NEGATIVE Sensitive     * ABUNDANT  METHICILLIN RESISTANT STAPHYLOCOCCUS AUREUS  Acid Fast Smear (AFB)     Status: None   Collection Time: 05/10/20  9:10 AM   Specimen: Soft Tissue, Other  Result Value Ref Range Status   AFB Specimen Processing Concentration  Final   Acid Fast Smear Negative  Final    Comment: (NOTE) Performed At: [facility] [address] [place], [place] [id number] Rush Farmer MD [id number]    Source (AFB) TRICUSPID VALVE VEGETATION  Final    Comment: Performed at [facility], [place] [address], [place], [place] [address]  Fungus Culture Result     Status: None   Collection Time: 05/10/20  9:10 AM  Result Value Ref Range Status   Result 1 Comment  Final    Comment: (NOTE) KOH/Calcofluor preparation:  no fungus observed. Performed At: [facility] [place], [place] [id number] Rush Farmer MD [id number]   Culture, respiratory (non-expectorated)     Status: None   Collection Time: 05/13/20  6:05 PM   Specimen: Tracheal Aspirate; Respiratory  Result Value Ref Range Status   Specimen Description TRACHEAL ASPIRATE  Final   Special Requests NONE  Final   Gram Stain   Final    NO WBC SEEN FEW GRAM POSITIVE COCCI Performed at [facility] Lab, [facility] [address], [place], [place] [address]    Culture FEW METHICILLIN RESISTANT STAPHYLOCOCCUS AUREUS  Final   Report Status 05/15/2020 FINAL  Final   Organism ID, Bacteria METHICILLIN RESISTANT STAPHYLOCOCCUS AUREUS  Final      Susceptibility   Methicillin resistant staphylococcus aureus - MIC*    CIPROFLOXACIN <=0.5 SENSITIVE Sensitive     ERYTHROMYCIN >=8 RESISTANT Resistant     GENTAMICIN <=0.5 SENSITIVE Sensitive     OXACILLIN >=4 RESISTANT Resistant     TETRACYCLINE <=1 SENSITIVE Sensitive     VANCOMYCIN <=0.5 SENSITIVE Sensitive     TRIMETH/SULFA <=10 SENSITIVE Sensitive     CLINDAMYCIN <=0.25 SENSITIVE Sensitive     RIFAMPIN <=0.5 SENSITIVE Sensitive     Inducible Clindamycin NEGATIVE Sensitive      * FEW METHICILLIN RESISTANT STAPHYLOCOCCUS AUREUS  Culture, blood (routine x 2)     Status: None (Preliminary result)   Collection Time: 05/13/20  6:21 PM   Specimen: BLOOD  Result Value Ref Range Status   Specimen Description BLOOD LEFT ANTECUBITAL  Final   Special Requests   Final    BOTTLES DRAWN AEROBIC AND ANAEROBIC Blood Culture adequate volume   Culture   Final    NO GROWTH 2 DAYS Performed at [facility], [address] [place], [place] [address]    Report Status PENDING  Incomplete  Culture, blood (routine x 2)     Status: None (Preliminary result)   Collection Time: 05/13/20  6:30 PM   Specimen: BLOOD LEFT FOREARM  Result Value Ref Range Status   Specimen Description BLOOD LEFT FOREARM  Final   Special Requests   Final    BOTTLES DRAWN AEROBIC AND ANAEROBIC Blood Culture adequate volume   Culture   Final    NO GROWTH 2 DAYS Performed at [facility], [place] [address] [place], [place] [address]    Report Status PENDING  Incomplete  MRSA PCR Screening     Status: Abnormal   Collection Time: 05/13/20  7:45 PM   Specimen: Nasal Mucosa; Nasopharyngeal  Result Value Ref Range Status   MRSA by PCR POSITIVE (A) NEGATIVE Final    Comment:        The GeneXpert MRSA Assay (FDA approved for NASAL specimens only), is one component of a comprehensive MRSA colonization surveillance program. It is not intended to diagnose MRSA infection nor to guide or monitor treatment for MRSA infections. RESULT CALLED TO, READ BACK BY AND VERIFIED WITH: Bailey Mech RN 05/13/20 2117 JDW Performed at [facility] [address] [place], [place] [address]     Studies/Results: DG Abd 1 View  Result Date: 05/13/2020 CLINICAL DATA:  Orogastric tube placement. EXAM: ABDOMEN - 1 VIEW COMPARISON:  None. FINDINGS: A nasogastric tube is seen with its distal tip overlying the expected region of the gastric antrum. The visualized loops of large and small bowel are normal in caliber.  A large amount of stool is seen within the ascending and proximal transverse colon. No radio-opaque calculi are seen. Radiopaque contrast is noted within the urinary bladder. IMPRESSION: Nasogastric tube positioning, as described above. Electronically Signed   By: Virgina Norfolk M.D.   On: 05/13/2020 20:45   CT ANGIO CHEST PE W OR WO CONTRAST  Result Date: 05/13/2020 CLINICAL DATA:  Possible pulmonary embolism. EXAM: CT ANGIOGRAPHY CHEST WITH CONTRAST TECHNIQUE: Multidetector CT imaging of the chest was performed using the standard protocol during bolus administration of intravenous contrast. Multiplanar CT image reconstructions and MIPs were obtained to evaluate the vascular anatomy. CONTRAST:  157m OMNIPAQUE IOHEXOL 350 MG/ML SOLN COMPARISON:  04/28/2020 FINDINGS: Cardiovascular: Mild cardiomegaly. Thoracic aorta is normal in caliber. Pulmonary arterial system is well opacified without evidence of emboli. Subtle low-density at a bifurcation point of a posterior right lower lobar pulmonary artery and likely bifurcation artifact not emboli. Remaining vascular structures are unremarkable. Mediastinum/Nodes: 1.2 cm precarinal lymph node likely reactive. 1.2 cm right paratracheal lymph node. 1.2 cm left hilar lymph node and 1.4 cm right hilar lymph node likely reactive. Remaining mediastinal structures are unremarkable. Lungs/Pleura: Lungs are adequately inflated demonstrate evidence of patient's known numerous cavitary and non cavitary nodular opacities. There is been overall progression of this airspace process with worsening more confluent airspace opacification over the right upper and lower lobes worsening small right pleural effusion and tiny amount left pleural fluid. Minimal fluid over the major fissures. Airways are otherwise normal. Upper Abdomen: No acute findings. Musculoskeletal: Unchanged. Review of the MIP images confirms the above findings. IMPRESSION: 1. Interval progression of numerous  bilateral cavitary and non cavitary nodular opacities. Worsening confluent airspace opacification over the right upper and lower lobes. Slight worsening small amount of bilateral pleural fluid right worse than left. Minimal mediastinal/hilar adenopathy likely reactive. Findings likely due  to infection and possible septic emboli. 2.  No evidence of pulmonary emboli. 3.  Mild cardiomegaly. Electronically Signed   By: Marin Olp M.D.   On: 05/13/2020 17:05   DG CHEST PORT 1 VIEW  Result Date: 05/14/2020 CLINICAL DATA:  Endotracheal tube position EXAM: PORTABLE CHEST 1 VIEW COMPARISON:  05/13/2020 FINDINGS: Endotracheal tube tip is below the level of the clavicular heads by approximately 2 cm. It is well above the carina. There are persistent opacities in the lower right lung. No pleural effusion or pneumothorax. Esophageal tube courses below the field of view. Right subclavian approach catheter tip projects within the proximal right atrium. Left subclavian catheter tip projects in the lower SVC. IMPRESSION: 1. Endotracheal tube tip between thoracic inlet and carina. 2. Persistent right lower lung opacities. Electronically Signed   By: Ulyses Jarred M.D.   On: 05/14/2020 01:12   DG CHEST PORT 1 VIEW  Result Date: 05/13/2020 CLINICAL DATA:  Endotracheal intubation. EXAM: PORTABLE CHEST 1 VIEW COMPARISON:  CTA chest 05/13/2020. One-view chest x-ray 05/13/2020 at 8:12 a.m. FINDINGS: The patient has been intubated. Endotracheal tube extends into the right mainstem bronchus should be pulled back at least 4 cm for more optimal positioning. Right-sided PICC line terminates at the cavoatrial junction and is stable. Left subclavian line terminates in the mid SVC. Patchy bilateral airspace disease is stable, right greater than left. The stomach is distended with gas. IMPRESSION: 1. The endotracheal tube extends into the right mainstem bronchus. The tube should be pulled back at least 4 cm for more optimal positioning.  2. Stable right-sided PICC line and left subclavian line. 3. Stable bilateral airspace disease, right greater than left. These results will be called to the ordering clinician or representative by the Radiologist Assistant, and communication documented in the PACS or [employer]. Electronically Signed   By: San Morelle M.D.   On: 05/13/2020 17:58   ECHOCARDIOGRAM LIMITED  Result Date: 05/14/2020    ECHOCARDIOGRAM REPORT   Patient Name:   GER RINGENBERG Date of Exam: 05/14/2020 Medical Rec #:  [id number]     Height:       67.0 in Accession #:    [id number]    Weight:       121.3 lb Date of Birth:  April 27, 1991     BSA:          1.635 m Patient Age:    53 years      BP:           81/64 mmHg Patient Gender: M             HR:           99 bpm. Exam Location:  Inpatient Procedure: 2D Echo Indications:    Endocarditis I38  History:        Patient has prior history of Echocardiogram examinations, most                 recent 04/29/2020. Signs/Symptoms:Chest Pain; Risk Factors:Former                 Smoker. IVDA, MRSA, Endocarditits.  Sonographer:    Leavy Cella Referring Phys: [id number] RAVI AGARWALA IMPRESSIONS  1. Left ventricular ejection fraction, by estimation, is 20 to 25%. The left ventricle has severely decreased function. The left ventricle demonstrates global hypokinesis. Left ventricular diastolic function could not be evaluated.  2. Right ventricular systolic function is hyperdynamic. The right ventricular size is normal. There is normal pulmonary artery systolic pressure.  The estimated right ventricular systolic pressure is 88.8 mmHg.  3. Cannot exclude tamponade physiology given the degree of cardiomyopathy. There is RV and RA diastolic collapse and the RV is hyperdynamic. The pericardial effusion is circumferential.  4. The mitral valve is abnormal. Mild to moderate mitral valve regurgitation.  5. Medium size 0.5 x 1.75 cm mobile filamentous mass on the tricupsid valve that prolapses across the  valve plane, consistent with vegetation.. The tricuspid valve is abnormal. Tricuspid valve regurgitation is severe.  6. The aortic valve is tricuspid. Aortic valve regurgitation is not visualized.  7. The inferior vena cava is normal in size with <50% respiratory variability, suggesting right atrial pressure of 8 mmHg. Comparison(s): Changes from prior study are noted, including further reduction in LVEF. 05/10/2020: LVEF 50-55%, 1-1.5 cm TV vegetation with severe TR, small pericardial effusion. Findings d/w Dr. Lynetta Mare (PCCM). FINDINGS  Left Ventricle: Left ventricular ejection fraction, by estimation, is 20 to 25%. The left ventricle has severely decreased function. The left ventricle demonstrates global hypokinesis. The left ventricular internal cavity size was normal in size. There is no left ventricular hypertrophy. Left ventricular diastolic function could not be evaluated. Right Ventricle: The right ventricular size is normal. No increase in right ventricular wall thickness. Right ventricular systolic function is hyperdynamic. There is normal pulmonary artery systolic pressure. The tricuspid regurgitant velocity is 2.62 m/s, and with an assumed right atrial pressure of 8 mmHg, the estimated right ventricular systolic pressure is 28.0 mmHg. Left Atrium: Left atrial size was normal in size. Right Atrium: Right atrial size was normal in size. Pericardium: Cannot exclude tamponade physiology given the degree of cardiomyopathy. There is RV and RA diastolic collapse and the RV is hyperdynamic. A small pericardial effusion is present. The pericardial effusion is circumferential. Mitral Valve: The mitral valve is abnormal. There is mild thickening of the mitral valve leaflet(s). Mild to moderate mitral valve regurgitation. Tricuspid Valve: Medium size 0.5 x 1.75 cm mobile filamentous mass on the tricupsid valve that prolapses across the valve plane, consistent with vegetation. The tricuspid valve is abnormal.  Tricuspid valve regurgitation is severe. Aortic Valve: The aortic valve is tricuspid. Aortic valve regurgitation is not visualized. Pulmonic Valve: The pulmonic valve was not well visualized. Pulmonic valve regurgitation is not visualized. Aorta: The aortic root was not well visualized. Venous: The inferior vena cava is normal in size with less than 50% respiratory variability, suggesting right atrial pressure of 8 mmHg. IAS/Shunts: No atrial level shunt detected by color flow Doppler.  MITRAL VALVE               TRICUSPID VALVE MV Area (PHT): 4.57 cm    TR Peak grad:   27.5 mmHg MV Decel Time: 166 msec    TR Vmax:        262.00 cm/s MV E velocity: 72.00 cm/s MV A velocity: 43.70 cm/s MV E/A ratio:  1.65 Lyman Bishop MD Electronically signed by Lyman Bishop MD Signature Date/Time: 05/14/2020/4:54:39 PM    Final    CT Angio Abd/Pel w/ and/or w/o  Result Date: 05/13/2020 CLINICAL DATA:  Acute mesenteric ischemia EXAM: CTA ABDOMEN AND PELVIS WITHOUT AND WITH CONTRAST TECHNIQUE: Multidetector CT imaging of the abdomen and pelvis was performed using the standard protocol during bolus administration of intravenous contrast. Multiplanar reconstructed images and MIPs were obtained and reviewed to evaluate the vascular anatomy. CONTRAST:  57m OMNIPAQUE IOHEXOL 350 MG/ML SOLN COMPARISON:  04/28/2020 FINDINGS: VASCULAR Aorta: Normal Celiac: Replaced left hepatic artery. Common hepatic  artery gives rise only to the segment 4 hepatic artery. Celiac axis otherwise unremarkable. SMA: Replaced right hepatic artery.  Otherwise unremarkable. Renals: Single renal arteries bilaterally with almost immediate takeoff of a superior polar branch of the left renal artery. Renal arteries are widely patent. IMA: Normal Inflow: Normal Proximal Outflow: Normal Veins: Not opacified, however, morphologically normal. Review of the MIP images confirms the above findings. NON-VASCULAR Lower chest: When compared to prior examination, there is  increasing cavitation and consolidation within the lung bases related to the patient's known septic embolization. Small right pleural effusion has developed. Cardiac size is mildly enlarged for age. Hepatobiliary: Liver and gallbladder are unremarkable. Pancreas: Unremarkable Spleen: Borderline splenomegaly is stable Adrenals/Urinary Tract: Adrenal glands and kidneys are unremarkable. The bladder is mildly distended. Tiny focus of gas within the bladder lumen non dependently may relate to recent catheterization. Stomach/Bowel: Nasogastric tube extends into the third portion of the duodenum. Multiple gas-filled loops of large and small bowel are again identified suggesting a mild ileus. Spine moderate stool noted throughout the colon. No pneumatosis no free intraperitoneal gas. Lymphatic: No pathologic adenopathy within the abdomen and pelvis. Reproductive: Unremarkable Other: There is mild edema within the retroperitoneum, particularly within the perirectal and presacral soft tissues. There is extensive subcutaneous edema identified, particularly within the flanks bilaterally. Gas within the left lower quadrant anterior abdominal wall subcutaneous fat may relate to subcutaneous injection. Musculoskeletal: No acute bone abnormality. IMPRESSION: VASCULAR Normal examination. NON-VASCULAR Marked interval progression of bibasilar cavitation and consolidation related to the patient's septic embolization. New small right pleural effusion. Progressive anasarca. Nasogastric tube extends into the third portion of the duodenum. Electronically Signed   By: Fidela Salisbury MD   On: 05/13/2020 22:33      Assessment/Plan:  INTERVAL HISTORY: Fevers have disappeared and he is no longer on pressors    Principal Problem:   Endocarditis of tricuspid valve Active Problems:   Intravenous drug abuse ([place])   Septic embolism ([facility])   Sepsis ([facility])   Thrombocytopenia (H[facility])   Hypokalemia   Hyponatremia   MRSA bacteremia    Malnutrition of moderate degree   Threatening behavior   Hand abscess   Elevated BUN   Pressure injury of skin   Chest pain    Clifford Clark is a 30 y.o. 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  He now has some neck pain where his central line is as well as new onset headache.  1.  MSSA bacteremia with tricuspid valve endocarditis and septic emboli: He underwent angio VAC.  Cultures from his valve yielded not just MSSA which grew phenotypically but also MRSA which actually had been identified initially on [facility] ID  We have changed him to vancomycin but in the interval he has deteriorated and was profoundly tachypneic yesterday.  CT scan of his lungs revealed progressive cavitary pathology related to his septic embolization.  He did not have any pulmonary emboli.  He was broadened with meropenem on top of the vancomycin he has been receiving.  Tracheal cultures were taken and have already started to grow Staph aureus.  His 2D echocardiogram shows a mobile vegetation present on the tricuspid valve.  He is going to need a protracted course of therapy directed at [facility] but also the MRSA  #2 cavitary pneumonia this is all MSSA and MRSA pathology.  He is worried about how long he  may be on the ventilator.   3.  Back pain: MRI did not show evidence of discitis in the thoracic or lumbar area but could have infection there and just not be showing up on MRI yet.  4.  For IV drug use: He will need plan for addressing this root cause   LOS: 17 days   Alcide Evener 05/15/2020, 1:36 PM

## 2020-05-15 NOTE — Plan of Care (Signed)
Remains in bilat soft limb wrist restraints to prevent traumatic removal of critical medical devices, following restraint bundle, no complications.   Problem: Clinical Measurements: Goal: Ability to maintain clinical measurements within normal limits will improve Outcome: Progressing Goal: Respiratory complications will improve Outcome: Progressing Goal: Cardiovascular complication will be avoided Outcome: Progressing   Problem: Nutrition: Goal: Adequate nutrition will be maintained Outcome: Progressing   Problem: Coping: Goal: Level of anxiety will decrease Outcome: Progressing   Problem: Pain Managment: Goal: General experience of comfort will improve Outcome: Progressing   Problem: Safety: Goal: Ability to remain free from injury will improve Outcome: Progressing   Problem: Skin Integrity: Goal: Risk for impaired skin integrity will decrease Outcome: Progressing

## 2020-05-15 NOTE — Progress Notes (Signed)
Fentanyl gtt bag changed, wasted 5 ml in stericycle with Arboriculturist.

## 2020-05-15 NOTE — Plan of Care (Signed)
  Problem: Clinical Measurements: Goal: Diagnostic test results will improve Outcome: Progressing Goal: Cardiovascular complication will be avoided Outcome: Progressing Note: Able to wean off pressors    Problem: Nutrition: Goal: Adequate nutrition will be maintained Outcome: Progressing   Problem: Elimination: Goal: Will not experience complications related to bowel motility Outcome: Not Progressing Note: Patient still not able to have bowel movement Goal: Will not experience complications related to urinary retention Outcome: Not Progressing Note: Patient requiring intermittent straight caths for retention

## 2020-05-16 DIAGNOSIS — R6521 Severe sepsis with septic shock: Secondary | ICD-10-CM

## 2020-05-16 DIAGNOSIS — I5021 Acute systolic (congestive) heart failure: Secondary | ICD-10-CM

## 2020-05-16 DIAGNOSIS — I313 Pericardial effusion (noninflammatory): Secondary | ICD-10-CM

## 2020-05-16 DIAGNOSIS — J9600 Acute respiratory failure, unspecified whether with hypoxia or hypercapnia: Secondary | ICD-10-CM

## 2020-05-16 LAB — POCT I-STAT, CHEM 8
BUN: 14 mg/dL (ref 6–20)
Calcium, Ion: 1.01 mmol/L — ABNORMAL LOW (ref 1.15–1.40)
Chloride: 98 mmol/L (ref 98–111)
Creatinine, Ser: 0.4 mg/dL — ABNORMAL LOW (ref 0.61–1.24)
Glucose, Bld: 127 mg/dL — ABNORMAL HIGH (ref 70–99)
HCT: 20 % — ABNORMAL LOW (ref 39.0–52.0)
Hemoglobin: 6.8 g/dL — CL (ref 13.0–17.0)
Potassium: 4.2 mmol/L (ref 3.5–5.1)
Sodium: 137 mmol/L (ref 135–145)
TCO2: 28 mmol/L (ref 22–32)

## 2020-05-16 LAB — COMPREHENSIVE METABOLIC PANEL
ALT: 25 U/L (ref 0–44)
AST: 24 U/L (ref 15–41)
Albumin: 1.5 g/dL — ABNORMAL LOW (ref 3.5–5.0)
Alkaline Phosphatase: 144 U/L — ABNORMAL HIGH (ref 38–126)
Anion gap: 6 (ref 5–15)
BUN: 20 mg/dL (ref 6–20)
CO2: 27 mmol/L (ref 22–32)
Calcium: 7.7 mg/dL — ABNORMAL LOW (ref 8.9–10.3)
Chloride: 107 mmol/L (ref 98–111)
Creatinine, Ser: 0.55 mg/dL — ABNORMAL LOW (ref 0.61–1.24)
GFR calc Af Amer: >60 mL/min (ref >60)
GFR calc non Af Amer: >60 mL/min (ref >60)
Glucose, Bld: 110 mg/dL — ABNORMAL HIGH (ref 70–99)
Potassium: 4 mmol/L (ref 3.5–5.1)
Sodium: 140 mmol/L (ref 135–145)
Total Bilirubin: 0.8 mg/dL (ref 0.3–1.2)
Total Protein: 5.9 g/dL — ABNORMAL LOW (ref 6.5–8.1)

## 2020-05-16 LAB — GLUCOSE, CAPILLARY
Glucose-Capillary: 107 mg/dL — ABNORMAL HIGH (ref 70–99)
Glucose-Capillary: 115 mg/dL — ABNORMAL HIGH (ref 70–99)
Glucose-Capillary: 91 mg/dL (ref 70–99)
Glucose-Capillary: 93 mg/dL (ref 70–99)
Glucose-Capillary: 96 mg/dL (ref 70–99)
Glucose-Capillary: 98 mg/dL (ref 70–99)

## 2020-05-16 LAB — POCT I-STAT 7, (LYTES, BLD GAS, ICA,H+H)
Acid-Base Excess: 5 mmol/L — ABNORMAL HIGH (ref 0.0–2.0)
Bicarbonate: 29.7 mmol/L — ABNORMAL HIGH (ref 20.0–28.0)
Calcium, Ion: 1.11 mmol/L — ABNORMAL LOW (ref 1.15–1.40)
HCT: 17 % — ABNORMAL LOW (ref 39.0–52.0)
Hemoglobin: 5.8 g/dL — CL (ref 13.0–17.0)
O2 Saturation: 100 %
Potassium: 4.1 mmol/L (ref 3.5–5.1)
Sodium: 138 mmol/L (ref 135–145)
TCO2: 31 mmol/L (ref 22–32)
pCO2 arterial: 44.2 mmHg (ref 32.0–48.0)
pH, Arterial: 7.436 (ref 7.350–7.450)
pO2, Arterial: 174 mmHg — ABNORMAL HIGH (ref 83.0–108.0)

## 2020-05-16 LAB — CBC
HCT: 24 % — ABNORMAL LOW (ref 39.0–52.0)
Hemoglobin: 7.5 g/dL — ABNORMAL LOW (ref 13.0–17.0)
MCH: 28.2 pg (ref 26.0–34.0)
MCHC: 31.3 g/dL (ref 30.0–36.0)
MCV: 90.2 fL (ref 80.0–100.0)
Platelets: 133 10*3/uL — ABNORMAL LOW (ref 150–400)
RBC: 2.66 MIL/uL — ABNORMAL LOW (ref 4.22–5.81)
RDW: 15.2 % (ref 11.5–15.5)
WBC: 11.9 10*3/uL — ABNORMAL HIGH (ref 4.0–10.5)
nRBC: 0 % (ref 0.0–0.2)

## 2020-05-16 LAB — TRIGLYCERIDES: Triglycerides: 212 mg/dL — ABNORMAL HIGH (ref <150)

## 2020-05-16 MED ORDER — ADULT MULTIVITAMIN W/MINERALS CH: 1.0000 | ORAL_TABLET | Freq: Every day | ORAL | Status: DC

## 2020-05-16 MED ORDER — VITAL AF 1.2 CAL PO LIQD
1000.0000 mL | ORAL | Status: DC
  Administered 2020-05-16 – 2020-05-17 (×2): 1000 mL

## 2020-05-16 MED ORDER — PROSOURCE TF PO LIQD
45.0000 mL | Freq: Two times a day (BID) | ORAL | Status: DC
  Administered 2020-05-16 – 2020-05-17 (×4): 45 mL
  Filled 2020-05-16 (×4): qty 45

## 2020-05-16 NOTE — Plan of Care (Signed)
Stable during shift, Tmax 38.3, cooling blanket in use. Remains on vent, sats stable, copious oral/endotracheal secretions, strong cough. Flotrack, high CO/CI, low SVR. BP stable off levo gtt. Lightly sedated on diprivan and fentanyl gtts, follows commands, writes notes, intermittent anxiety but responds well to reassurance. Bilat soft limb wrist restraints remain on for pt safety. Able to void, urine output 1.15 L/24 hr. Tolerating Tube Feeds, no BM, on regimen. Abx as ordered.   Problem: Education: Goal: Knowledge of General Education information will improve Description: Including pain rating scale, medication(s)/side effects and non-pharmacologic comfort measures Outcome: Progressing   Problem: Health Behavior/Discharge Planning: Goal: Ability to manage health-related needs will improve Outcome: Progressing   Problem: Clinical Measurements: Goal: Ability to maintain clinical measurements within normal limits will improve Outcome: Progressing Goal: Will remain free from infection Outcome: Progressing Goal: Diagnostic test results will improve Outcome: Progressing Goal: Respiratory complications will improve Outcome: Progressing Goal: Cardiovascular complication will be avoided Outcome: Progressing   Problem: Activity: Goal: Risk for activity intolerance will decrease Outcome: Progressing   Problem: Nutrition: Goal: Adequate nutrition will be maintained Outcome: Progressing   Problem: Coping: Goal: Level of anxiety will decrease Outcome: Progressing   Problem: Elimination: Goal: Will not experience complications related to urinary retention Outcome: Progressing   Problem: Pain Managment: Goal: General experience of comfort will improve Outcome: Progressing   Problem: Safety: Goal: Ability to remain free from injury will improve Outcome: Progressing   Problem: Skin Integrity: Goal: Risk for impaired skin integrity will decrease Outcome: Progressing

## 2020-05-16 NOTE — Progress Notes (Addendum)
NAME:  Clifford Clark, MRN:  [id number], DOB:  1991-08-07, LOS: 18 ADMISSION DATE:  04/28/2020, CONSULTATION DATE:  05/13/2020 REFERRING MD:  Albertine Grates CHIEF COMPLAINT:  Endocarditis  Brief History   29 yo male with PMH significant for IVDA (fentanyl), asthma admitted with tricuspid valve endocarditis, MRSA bacteremia.   History of present illness   Patient presented to ED on 7/08 after being found on the ground by a friend.  His work up revealed Tricuspid valve endocarditis with MRSA bacteremia. He was also found to have septic emboli to kidneys and lungs. CT of hands showed bilateral abscesses and underwent I&D on 7/13. 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 Patient was found to have increasing tachycardia and tachypnea today.     Patient is negative 4 L to date and negative 700 ml on I/O.  T max 103. A CTA Chest was completed, post imaging, patient returned to room and found to have respiratory rate in 50's.  CCM consulted and patient transferred to ICU for intubation and mechanical ventilation. Post intubation, patient did require levo during intubation.    Past Medical History  IVDA --Fentanyl Asthma  Significant Hospital Events   7/08: Admitted 7/13: I&D of bilateral and abscesses 7/23: transferred to ICU due to respiratory distress. Intubated.  7/24 ECHO reveals worsening EF  7/26: off pressors, Weaning sedation, following commands   Consults:  ID Orthopedics CVTS  Psych PCCM  Procedures:  7/13: I&D of bilateral and abscesses 7/20: Debridement of right atrial mass and debridement of tricuspid valve vegetation 7/22: PICC line placed 7/23: CVC 7/23: ETT   Significant Diagnostic Tests:  7/8: Abdominal U/S: Hepatomegaly 7/16: TEE: LVEF 60-65%, RV normal, left atrial thrombus, small pericardial effusion, MVR. Tricuspid valve vegetation.  Pulmonic valve vegetation. 7/21: [place] of LUE without  evidence of superficial or deep vein thrombosis  7/23: CTA chest without PE, multifocal PNA and septic emboli -worsening compared to previous studies. 7/24: echo shows worsening cardiomyopathy. LVEF 20-25% LV global hypokinesis. Hyperdynamic RV. Small circumferential pericardial effusion. Abnormal mitral valve with mild-moderate MVR. Mobile filamentous mass on tricuspid valve 0.5x1.75cm-- c/w vegetation. Severe TVR.   Micro Data:  7/8 Blood Cx: MRSA Bacteremia 7/13: Blood Cx: MRSA Bacteremia 7/13: Fungal Cx: NGTD 7/20: Blood Cx:  MRSA Bacteremia 7/23 Blood Cx: >> GPC 7/23 respiratory Cx >> GPC  Antimicrobials:  Vanc >>  [facility] 7/23  Interim history/subjective:  Remains off pressors Temp 100.9 overnight, intermittently required cooling blanket  WBC down trending to 11.9  Weaning sedation slowly  Objective   Blood pressure 91/68, pulse 96, temperature 99.1 F (37.3 C), temperature source Rectal, resp. rate (!) 24, height 5\' 7"  (1.702 m), weight 66.1 kg, SpO2 100 %. CVP:  [4 mmHg-14 mmHg] 9 mmHg  Vent Mode: PRVC FiO2 (%):  [40 %] 40 % Set Rate:  [24 bmp] 24 bmp Vt Set:  [390 mL] 390 mL PEEP:  [5 cmH20] 5 cmH20 Plateau Pressure:  [16 cmH20-17 cmH20] 17 cmH20   Intake/Output Summary (Last 24 hours) at 05/16/2020 0746 Last data filed at 05/16/2020 0713 Gross per 24 hour  Intake 2445.7 ml  Output 1150 ml  Net 1295.7 ml   Filed Weights   04/28/20 2130 05/15/20 0530 05/16/20 0446  Weight: 55 kg 65.3 kg 66.1 kg     11.1    CO  CI              6.8    CI    SV              91    SV    SVR              538    SVR    SVRI              747    SVRI     Examination: General: Critically ill appearing young adult M, intubated, sedated, NAD HENT: ETT secure OGT secure. Anicteric sclera. Moderate clear oral secretions.  Lungs: Scattered rhonchi, Symmetrical chest expansion, Mechanically ventilated  Cardiovascular: RRR s1s2. Cap refill < 3 seconds. No appreciable JVD    Abdomen: soft flat ndnt + bowel sounds  Extremities: No obvious joint deformity. BLE 1+ edema. No cyanosis or clubbing  Neuro: Lightly sedated, awakens to voice, follows commands. 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 s/p angiovac 7/20 P - ID following -Cont vanc   Septic shock in setting of above MRSA MSSA bacteremia, endocarditic, PNA -- improved shock  -now c/w severe sepsis P -ok to dc flowtrak  -MAP goal > 65 -Vanc for severe sepsis   Inadequate PO intake P -Continue EN  Substance abuse disorder P -will need cessation counseling when medically appropriate   Bilateral wrist abscesses, s/p I&D 7/13 -sutures per ortho   Daily Goals Checklist  Pain/Anxiety/Delirium protocol (if indicated): Propofol, Fentanyl  VAP protocol (if indicated): Bundle in place Respiratory support goals: QAM SBT Blood pressure target: Titrate to keep MAP greater than 65 DVT prophylaxis: Enoxaparin Nutrition Status: EN GI prophylaxis: Pantoprazole Fluid status goals: Allow autoregulation in positive fluid balance Urinary catheter: Handheld urinal voids oer self Central lines: Right arm single-lumen PICC, left subclavian triple-lumen. Glucose control: Euglycemic.  No history of diabetes. Mobility/therapy needs:  Antibiotic de-escalation: Continue vancomycin. Home medication reconciliation: On hold. Daily labs: CBC, BMP Code Status: Full code Family Communication: Pending 7/26 -- Updated patient at bedside  Disposition: ICU   Labs   CBC: Recent Labs  Lab 05/12/20 0500 05/12/20 0500 05/13/20 0839 05/13/20 1815 05/13/20 1834 05/15/20 0414 05/16/20 0405  WBC 13.3*  --   17.8*  --  15.7* 16.9* 11.9*  NEUTROABS  --   --  14.9*  --   --   --   --   HGB 8.4*   < > 8.1* 9.5* 8.5* 8.0* 7.5*  HCT 25.6*   < > 24.5* 28.0* 26.2* 24.3* 24.0*  MCV 89.2  --  88.8  --  90.0 90.0 90.2  PLT 145*  --  124*  --  123* 128* 133*   < > = values in this interval not displayed.    Basic Metabolic Panel: Recent Labs  Lab 05/13/20 0839 05/13/20 0839 05/13/20 1815 05/13/20 1834 05/14/20 1617 05/15/20 0414 05/15/20 1714 05/16/20 0405  NA 132*   < > 133* 131* 137 139  --  140  K 4.1   < > 4.2 4.1 3.6 3.6  --  4.0  CL 97*  --   --  98 104 106  --  107  CO2 27  --   --  24 25 24   --  27  GLUCOSE 117*  --   --  163* 120* 136*  --  110*  BUN 8  --   --  13 19 21*  --  20  CREATININE 0.70  --   --  0.71 0.64 0.59*  --  0.55*  CALCIUM 7.5*  --   --  7.4* 7.1* 7.3*  --  7.7*  MG 1.8  --   --  1.9 2.0 2.1 2.1  --   PHOS  --   --   --  4.1 3.8 3.0 2.3*  --    < > = values in this interval not displayed.   GFR: Estimated Creatinine Clearance: 127.4 mL/min (A) (by C-G formula based on SCr of 0.55 mg/dL (L)). Recent Labs  Lab 05/13/20 0839 05/13/20 1834 05/13/20 2050 05/15/20 0414 05/16/20 0405  WBC 17.8* 15.7*  --  16.9* 11.9*  LATICACIDVEN  --  1.5 1.2  --   --     Liver Function Tests: Recent Labs  Lab 05/12/20 0500 05/13/20 0839 05/13/20 1834 05/15/20 0414 05/16/20 0405  AST 105* 37 39 20 24  ALT 83* 40 41 25 25  ALKPHOS 124 110 120 98 144*  BILITOT 0.6 0.6 0.8 0.5 0.8  PROT 5.8* 6.2* 6.3* 5.7* 5.9*  ALBUMIN 1.5* 1.4* 1.5* 1.3* 1.5*   No results for input(s): LIPASE, AMYLASE in the last 168 hours. No results for input(s): AMMONIA in the last 168 hours.  ABG    Component Value Date/Time   PHART 7.369 05/13/2020 1815   PCO2ART 45.9 05/13/2020 1815   PO2ART 137 (H) 05/13/2020 1815   HCO3 26.4 05/13/2020 1815   TCO2 28 05/13/2020 1815   ACIDBASEDEF 0.6 05/06/2020 2215   O2SAT 71.9 05/14/2020 1707     Coagulation Profile: Recent Labs  Lab  05/15/20 0414  INR 1.4*    Cardiac Enzymes: No results for input(s): CKTOTAL, CKMB, CKMBINDEX, TROPONINI in the last 168 hours.  HbA1C: No results found for: HGBA1C  CBG: Recent Labs  Lab 05/15/20 0815 05/15/20 1201 05/15/20 1551 05/15/20 2003 05/16/20 0037  GLUCAP 130* 104* 96 91 91     CRITICAL CARE Performed by: 05/18/20   Total critical care time: 37 minutes  Critical care time was exclusive of separately billable procedures and treating other patients. Critical care was necessary to treat or prevent imminent or life-threatening deterioration.  Critical care was time spent personally by me on the following activities: development of treatment plan with patient and/or surrogate as well as nursing, discussions with consultants, evaluation of patient's response to treatment, examination of patient, obtaining history from patient or surrogate, ordering and performing treatments and interventions, ordering and review of laboratory studies, ordering and review of radiographic studies, pulse oximetry and re-evaluation of patient's condition.  Lanier Clam MSN, AGACNP-BC [facility] Tessie Fass If no answer, [phone] 05/16/2020, 7:46 AM

## 2020-05-16 NOTE — Progress Notes (Addendum)
Subjective:  Sedated currently   Antibiotics:  Anti-infectives (From admission, onward)   Start     Dose/Rate Route Frequency Ordered Stop   05/13/20 1730  meropenem (MERREM) 1 g in sodium chloride 0.9 % 100 mL IVPB  Status:  Discontinued        1 g 200 mL/hr over 30 Minutes Intravenous Every 8 hours 05/13/20 1720 05/14/20 1154   05/12/20 1600  vancomycin (VANCOREADY) IVPB 750 mg/150 mL     Discontinue     750 mg 150 mL/hr over 60 Minutes Intravenous Every 8 hours 05/12/20 1509     05/05/20 1600  ceFAZolin (ANCEF) IVPB 2g/100 mL premix  Status:  Discontinued        2 g 200 mL/hr over 30 Minutes Intravenous Every 8 hours 05/05/20 1452 05/12/20 1509   05/04/20 1200  vancomycin (VANCOCIN) IVPB 1000 mg/200 mL premix  Status:  Discontinued        1,000 mg 200 mL/hr over 60 Minutes Intravenous Every 12 hours 05/04/20 1045 05/04/20 1055   05/04/20 1200  vancomycin (VANCOREADY) IVPB 750 mg/150 mL  Status:  Discontinued        750 mg 150 mL/hr over 60 Minutes Intravenous Every 8 hours 05/04/20 1055 05/05/20 1452   05/03/20 0945  vancomycin (VANCOCIN) powder  Status:  Discontinued          As needed 05/03/20 0945 05/03/20 1057   05/03/20 0920  ceFAZolin (ANCEF) 2-4 GM/100ML-% IVPB       Note to Pharmacy: Charmayne Sheer   : cabinet override      05/03/20 0920 05/03/20 2129   05/02/20 1500  vancomycin (VANCOREADY) IVPB 750 mg/150 mL  Status:  Discontinued        750 mg 150 mL/hr over 60 Minutes Intravenous Every 12 hours 05/02/20 1425 05/04/20 1045   05/01/20 1330  vancomycin variable dose per unstable renal function (pharmacist dosing)  Status:  Discontinued         Does not apply See admin instructions 05/01/20 1330 05/03/20 0810   04/29/20 1000  vancomycin (VANCOREADY) IVPB 750 mg/150 mL  Status:  Discontinued        750 mg 150 mL/hr over 60 Minutes Intravenous Every 8 hours 04/29/20 0834 05/01/20 1329   04/29/20 0400  metroNIDAZOLE (FLAGYL) IVPB 500 mg  Status:   Discontinued        500 mg 100 mL/hr over 60 Minutes Intravenous Every 8 hours 04/28/20 2122 04/29/20 0819   04/29/20 0400  ceFEPIme (MAXIPIME) 2 g in sodium chloride 0.9 % 100 mL IVPB  Status:  Discontinued        2 g 200 mL/hr over 30 Minutes Intravenous Every 8 hours 04/28/20 2138 04/29/20 0819   04/29/20 0100  vancomycin (VANCOCIN) IVPB 1000 mg/200 mL premix  Status:  Discontinued        1,000 mg 200 mL/hr over 60 Minutes Intravenous Every 8 hours 04/28/20 2146 04/29/20 0833   04/28/20 1715  ceFEPIme (MAXIPIME) 2 g in sodium chloride 0.9 % 100 mL IVPB        2 g 200 mL/hr over 30 Minutes Intravenous  Once 04/28/20 1708 04/28/20 1903   04/28/20 1715  metroNIDAZOLE (FLAGYL) IVPB 500 mg        500 mg 100 mL/hr over 60 Minutes Intravenous  Once 04/28/20 1708 04/28/20 2111   04/28/20 1715  vancomycin (VANCOCIN) IVPB 1000 mg/200 mL premix        1,000 mg  200 mL/hr over 60 Minutes Intravenous  Once 04/28/20 1708 04/28/20 1947      Medications: Scheduled Meds: . chlorhexidine gluconate (MEDLINE KIT)  15 mL Mouth Rinse BID  . Chlorhexidine Gluconate Cloth  6 each Topical Q0600  . docusate  100 mg Per Tube BID  . enoxaparin (LOVENOX) injection  40 mg Subcutaneous Q24H  . feeding supplement (PROSource TF)  45 mL Per Tube BID  . feeding supplement (VITAL HIGH PROTEIN)  1,000 mL Per Tube Q24H  . mouth rinse  15 mL Mouth Rinse 10 times per day  . multivitamin  15 mL Per Tube Daily  . mupirocin ointment  1 application Nasal BID  . pantoprazole sodium  20 mg Per Tube Daily  . polyethylene glycol  17 g Per Tube Daily  . Ensure Max Protein  11 oz Oral Daily  . sodium chloride flush  10-40 mL Intracatheter Q12H  . sodium chloride flush  3 mL Intravenous Q12H   Continuous Infusions: . sodium chloride    . fentaNYL infusion INTRAVENOUS 175 mcg/hr (05/16/20 1007)  . propofol (DIPRIVAN) infusion 25 mcg/kg/min (05/16/20 1000)  . vancomycin 150 mL/hr at 05/16/20 1000   PRN Meds:.acetaminophen  (TYLENOL) oral liquid 160 mg/5 mL, acetaminophen **OR** acetaminophen, fentaNYL, ipratropium-albuterol, LORazepam, metoprolol tartrate, ondansetron **OR** ondansetron (ZOFRAN) IV, ondansetron, sodium chloride flush    Objective: Weight change: 0.8 kg  Intake/Output Summary (Last 24 hours) at 05/16/2020 1109 Last data filed at 05/16/2020 1000 Gross per 24 hour  Intake 2549.18 ml  Output 1725 ml  Net 824.18 ml   Blood pressure 107/73, pulse (!) 119, temperature 99.1 F (37.3 C), temperature source Rectal, resp. rate (!) 25, height '5\' 7"'  (1.702 m), weight 66.1 kg, SpO2 100 %. Temp:  [98.6 F (37 C)-100.9 F (38.3 C)] 99.1 F (37.3 C) (07/26 0400) Pulse Rate:  [96-120] 119 (07/26 1000) Resp:  [17-36] 25 (07/26 1000) BP: (84-107)/(57-80) 107/73 (07/26 1000) SpO2:  [95 %-100 %] 100 % (07/26 1000) Arterial Line BP: (97-147)/(51-81) 147/81 (07/26 1000) FiO2 (%):  [40 %] 40 % (07/26 1000) Weight:  [66.1 kg] 66.1 kg (07/26 0446)  Physical Exam: General: On the ventilator  sedated CVS tachycardic, could not appreciate murmurs Chest: Rhonchorous Abdomen: soft non-distended,  Extremities: He has some edema bilaterally Skin: no rashes Neuro: nonfocal  CBC:    BMET Recent Labs    05/15/20 0414 05/16/20 0405  NA 139 140  K 3.6 4.0  CL 106 107  CO2 24 27  GLUCOSE 136* 110*  BUN 21* 20  CREATININE 0.59* 0.55*  CALCIUM 7.3* 7.7*     Liver Panel  Recent Labs    05/15/20 0414 05/16/20 0405  PROT 5.7* 5.9*  ALBUMIN 1.3* 1.5*  AST 20 24  ALT 25 25  ALKPHOS 98 144*  BILITOT 0.5 0.8       Sedimentation Rate No results for input(s): ESRSEDRATE in the last 72 hours. C-Reactive Protein No results for input(s): CRP in the last 72 hours.  Micro Results: Recent Results (from the past 720 hour(s))  Culture, blood (routine x 2)     Status: Abnormal   Collection Time: 04/28/20  3:45 PM   Specimen: BLOOD  Result Value Ref Range Status   Specimen Description   Final     BLOOD LEFT ANTECUBITAL Performed at [facility] [address] [place], [place] [address]    Special Requests   Final    BOTTLES DRAWN AEROBIC AND ANAEROBIC Blood Culture adequate  volume Performed at [facility], [place] [address] [place], [place] [address]    Culture  Setup Time   Final    GRAM POSITIVE COCCI IN BOTH AEROBIC AND ANAEROBIC BOTTLES CRITICAL RESULT CALLED TO, READ BACK BY AND VERIFIED WITH: PHARMD M. Lear Ng [phone] FCP    Culture (A)  Final    STAPHYLOCOCCUS AUREUS METHICILLIN RESISTANT ORGANISM WAS NOT RECOVERED IN CULTURE. REPEATED FOR CONFIRMATION Performed at [facility], [place] [address], [place], [place] [address]    Report Status 05/03/2020 FINAL  Final   Organism ID, Bacteria STAPHYLOCOCCUS AUREUS  Final      Susceptibility   Staphylococcus aureus - MIC*    CIPROFLOXACIN <=0.5 SENSITIVE Sensitive     ERYTHROMYCIN >=8 RESISTANT Resistant     GENTAMICIN <=0.5 SENSITIVE Sensitive     OXACILLIN 0.5 SENSITIVE Sensitive     TETRACYCLINE <=1 SENSITIVE Sensitive     VANCOMYCIN <=0.5 SENSITIVE Sensitive     TRIMETH/SULFA <=10 SENSITIVE Sensitive     CLINDAMYCIN <=0.25 SENSITIVE Sensitive     RIFAMPIN <=0.5 SENSITIVE Sensitive     Inducible Clindamycin NEGATIVE Sensitive     * STAPHYLOCOCCUS AUREUS  Blood Culture ID Panel (Reflexed)     Status: Abnormal   Collection Time: 04/28/20  3:45 PM  Result Value Ref Range Status   Enterococcus species NOT DETECTED NOT DETECTED Final   Listeria monocytogenes NOT DETECTED NOT DETECTED Final   Staphylococcus species DETECTED (A) NOT DETECTED Final    Comment: CRITICAL RESULT CALLED TO, READ BACK BY AND VERIFIED WITH: PHARMD Chales Abrahams [phone] FCP    Staphylococcus aureus (BCID) DETECTED (A) NOT DETECTED Final    Comment: Methicillin (oxacillin)-resistant Staphylococcus aureus (MRSA). MRSA is predictably resistant to beta-lactam antibiotics (except ceftaroline). Preferred  therapy is vancomycin unless clinically contraindicated. Patient requires contact precautions if  hospitalized. CRITICAL RESULT CALLED TO, READ BACK BY AND VERIFIED WITH: PHARMD M. Lear Ng [phone] FCP    Methicillin resistance DETECTED (A) NOT DETECTED Final    Comment: CRITICAL RESULT CALLED TO, READ BACK BY AND VERIFIED WITH: PHARMD M. RENZ [phone] FCP    Streptococcus species NOT DETECTED NOT DETECTED Final   Streptococcus agalactiae NOT DETECTED NOT DETECTED Final   Streptococcus pneumoniae NOT DETECTED NOT DETECTED Final   Streptococcus pyogenes NOT DETECTED NOT DETECTED Final   Acinetobacter baumannii NOT DETECTED NOT DETECTED Final   Enterobacteriaceae species NOT DETECTED NOT DETECTED Final   Enterobacter cloacae complex NOT DETECTED NOT DETECTED Final   Escherichia coli NOT DETECTED NOT DETECTED Final   Klebsiella oxytoca NOT DETECTED NOT DETECTED Final   Klebsiella pneumoniae NOT DETECTED NOT DETECTED Final   Proteus species NOT DETECTED NOT DETECTED Final   Serratia marcescens NOT DETECTED NOT DETECTED Final   Haemophilus influenzae NOT DETECTED NOT DETECTED Final   Neisseria meningitidis NOT DETECTED NOT DETECTED Final   Pseudomonas aeruginosa NOT DETECTED NOT DETECTED Final   Candida albicans NOT DETECTED NOT DETECTED Final   Candida glabrata NOT DETECTED NOT DETECTED Final   Candida krusei NOT DETECTED NOT DETECTED Final   Candida parapsilosis NOT DETECTED NOT DETECTED Final   Candida tropicalis NOT DETECTED NOT DETECTED Final    Comment: Performed at [facility], [place]. [address], [place], [place] [address]  Culture, blood (routine x 2)     Status: Abnormal   Collection Time: 04/28/20  4:13 PM   Specimen: BLOOD  Result Value Ref Range Status   Specimen Description  Final    BLOOD RIGHT ANTECUBITAL Performed at [facility] [address] [place], [place] [address]    Special Requests   Final    BOTTLES DRAWN AEROBIC AND ANAEROBIC  Blood Culture adequate volume Performed at [facility] [address], [place], [place] [address]    Culture  Setup Time   Final    GRAM POSITIVE COCCI IN BOTH AEROBIC AND ANAEROBIC BOTTLES CRITICAL VALUE NOTED.  VALUE IS CONSISTENT WITH PREVIOUSLY REPORTED AND CALLED VALUE.    Culture (A)  Final    STAPHYLOCOCCUS AUREUS METHICILLIN RESISTANT ORGANISM WAS NOT RECOVERED IN CULTURE. REPEATED FOR CONFIRMATION Performed at [facility], [place] [address], [place], [place] [address]    Report Status 05/03/2020 FINAL  Final   Organism ID, Bacteria STAPHYLOCOCCUS AUREUS  Final      Susceptibility   Staphylococcus aureus - MIC*    CIPROFLOXACIN <=0.5 SENSITIVE Sensitive     ERYTHROMYCIN >=8 RESISTANT Resistant     GENTAMICIN <=0.5 SENSITIVE Sensitive     OXACILLIN 0.5 SENSITIVE Sensitive     TETRACYCLINE <=1 SENSITIVE Sensitive     VANCOMYCIN <=0.5 SENSITIVE Sensitive     TRIMETH/SULFA <=10 SENSITIVE Sensitive     CLINDAMYCIN <=0.25 SENSITIVE Sensitive     RIFAMPIN <=0.5 SENSITIVE Sensitive     Inducible Clindamycin NEGATIVE Sensitive     * STAPHYLOCOCCUS AUREUS  Urine culture     Status: Abnormal   Collection Time: 04/28/20  7:00 PM   Specimen: In/Out Cath Urine  Result Value Ref Range Status   Specimen Description   Final    IN/OUT CATH URINE Performed at [facility] [address], [place], [place] [address]    Special Requests   Final    NONE Performed at [facility], [place] [address], [place], [place] [address]    Culture >=100,000 COLONIES/mL STAPHYLOCOCCUS AUREUS (A)  Final   Report Status 05/01/2020 FINAL  Final   Organism ID, Bacteria STAPHYLOCOCCUS AUREUS (A)  Final      Susceptibility   Staphylococcus aureus - MIC*    CIPROFLOXACIN <=0.5 SENSITIVE Sensitive     GENTAMICIN <=0.5 SENSITIVE Sensitive     NITROFURANTOIN <=16 SENSITIVE Sensitive     OXACILLIN 0.5 SENSITIVE Sensitive     TETRACYCLINE <=1  SENSITIVE Sensitive     VANCOMYCIN <=0.5 SENSITIVE Sensitive     TRIMETH/SULFA <=10 SENSITIVE Sensitive     CLINDAMYCIN <=0.25 SENSITIVE Sensitive     RIFAMPIN <=0.5 SENSITIVE Sensitive     Inducible Clindamycin NEGATIVE Sensitive     * >=100,000 COLONIES/mL STAPHYLOCOCCUS AUREUS  SARS Coronavirus 2 by RT PCR (hospital order, performed in [facility] hospital lab) Nasopharyngeal Nasopharyngeal Swab     Status: None   Collection Time: 04/28/20  7:03 PM   Specimen: Nasopharyngeal Swab  Result Value Ref Range Status   SARS Coronavirus 2 NEGATIVE NEGATIVE Final    Comment: (NOTE) SARS-CoV-2 target nucleic acids are NOT DETECTED.  The SARS-CoV-2 RNA is generally detectable in upper and lower respiratory specimens during the acute phase of infection. The lowest concentration of SARS-CoV-2 viral copies this assay can detect is 250 copies / mL. A negative result does not preclude SARS-CoV-2 infection and should not be used as the sole basis for treatment or other patient management decisions.  A negative result may occur with improper specimen collection / handling, submission of specimen other than nasopharyngeal swab, presence of viral mutation(s) within the areas targeted  by this assay, and inadequate number of viral copies (<250 copies / mL). A negative result must be combined with clinical observations, patient history, and epidemiological information.  Fact Sheet for Patients:   StrictlyIdeas.no  Fact Sheet for Healthcare Providers: BankingDealers.co.za  This test is not yet approved or  cleared by the [place] FDA and has been authorized for detection and/or diagnosis of SARS-CoV-2 by FDA under an Emergency Use Authorization (EUA).  This EUA will remain in effect (meaning this test can be used) for the duration of the COVID-19 declaration under Section 564(b)(1) of the Act, 21 U.S.C. section 360bbb-3(b)(1), unless the  authorization is terminated or revoked sooner.  Performed at [facility], [place] [address], [place], [place] [address]   MRSA PCR Screening     Status: Abnormal   Collection Time: 04/28/20  7:45 PM   Specimen: Nasal Mucosa; Nasopharyngeal  Result Value Ref Range Status   MRSA by PCR POSITIVE (A) NEGATIVE Final    Comment:        The GeneXpert MRSA Assay (FDA approved for NASAL specimens only), is one component of a comprehensive MRSA colonization surveillance program. It is not intended to diagnose MRSA infection nor to guide or monitor treatment for MRSA infections. RESULT CALLED TO, READ BACK BY AND VERIFIED WITH: S SMITH AT 2314 ON 04/28/2020 BY MOSLEY,J  Performed at [facility], [place] [address] [place], [place] [address]   Culture, blood (Routine X 2) w Reflex to ID Panel     Status: None   Collection Time: 04/30/20  8:15 AM   Specimen: BLOOD  Result Value Ref Range Status   Specimen Description   Final    BLOOD LEFT ANTECUBITAL Performed at [facility], [place] [address] [place], [place] [address]    Special Requests   Final    BOTTLES DRAWN AEROBIC AND ANAEROBIC Blood Culture adequate volume Performed at [facility] [address], [place], [place] [address]    Culture   Final    NO GROWTH 5 DAYS Performed at [facility], [place] [address], [place], [place] [address]    Report Status 05/05/2020 FINAL  Final  Culture, blood (Routine X 2) w Reflex to ID Panel     Status: None   Collection Time: 04/30/20  8:15 AM   Specimen: BLOOD LEFT FOREARM  Result Value Ref Range Status   Specimen Description   Final    BLOOD LEFT FOREARM Performed at [facility] [address], [place], [place] [address]    Special Requests   Final    BOTTLES DRAWN AEROBIC ONLY Blood Culture adequate volume Performed at [facility] [address], [place], [place] [address]     Culture   Final    NO GROWTH 5 DAYS Performed at [facility], [place] [address], [place], [place] [address]    Report Status 05/05/2020 FINAL  Final  Fungus Culture With Stain     Status: None (Preliminary result)   Collection Time: 05/03/20  9:35 AM   Specimen: Soft Tissue, Other  Result Value Ref Range Status   Fungus Stain Final report  Final    Comment: (NOTE) Performed At: [facility] [address] [place], [place] [id number] Rush Farmer MD VF:[id number]    Fungus (Mycology) Culture PENDING  Incomplete   Fungal Source TISSUE  Final    Comment: RT WRIST Performed at [facility], [address] [place], [place] [address]   Aerobic/Anaerobic Culture (surgical/deep wound)     Status: None   Collection Time: 05/03/20  9:35 AM   Specimen: Soft Tissue, Other  Result Value Ref Range Status   Specimen Description   Final    TISSUE RT WRIST Performed at [facility] [address] [place], [place] [address]    Special Requests   Final    PODIATRY Derrek Monaco Performed at [facility] [address], [place], [place] [address]    Gram Stain   Final    RARE WBC PRESENT,BOTH PMN AND MONONUCLEAR RARE GRAM POSITIVE COCCI IN PAIRS IN CLUSTERS    Culture   Final    RARE STAPHYLOCOCCUS AUREUS NO ANAEROBES ISOLATED Performed at [facility], [address], [place], [place] [address]    Report Status 05/08/2020 FINAL  Final   Organism ID, Bacteria STAPHYLOCOCCUS AUREUS  Final      Susceptibility   Staphylococcus aureus - MIC*    CIPROFLOXACIN <=0.5 SENSITIVE Sensitive     ERYTHROMYCIN >=8 RESISTANT Resistant     GENTAMICIN <=0.5 SENSITIVE Sensitive     OXACILLIN 0.5 SENSITIVE Sensitive     TETRACYCLINE <=1 SENSITIVE Sensitive     VANCOMYCIN <=0.5 SENSITIVE Sensitive     TRIMETH/SULFA <=10 SENSITIVE Sensitive     CLINDAMYCIN <=0.25 SENSITIVE Sensitive     RIFAMPIN <=0.5 SENSITIVE Sensitive      Inducible Clindamycin NEGATIVE Sensitive     * RARE STAPHYLOCOCCUS AUREUS  Fungus Culture Result     Status: None   Collection Time: 05/03/20  9:35 AM  Result Value Ref Range Status   Result 1 Comment  Final    Comment: (NOTE) KOH/Calcofluor preparation:  no fungus observed. Performed At: [facility] [place], [place] [id number] Rush Farmer MD AT:[id number]   Fungus Culture With Stain     Status: None (Preliminary result)   Collection Time: 05/03/20  9:38 AM   Specimen: Soft Tissue, Other  Result Value Ref Range Status   Fungus Stain Final report  Final    Comment: (NOTE) Performed At: [facility] [address] [place], [place] [id number] Rush Farmer MD VV:[id number]    Fungus (Mycology) Culture PENDING  Incomplete   Fungal Source TISSUE  Final    Comment: LT HAND Performed at [facility], [place] [address] [place], [place] [address]   Aerobic/Anaerobic Culture (surgical/deep wound)     Status: None   Collection Time: 05/03/20  9:38 AM   Specimen: Soft Tissue, Other  Result Value Ref Range Status   Specimen Description   Final    TISSUE LT HAND Performed at [facility] [address] [place], [place] [address]    Special Requests   Final    PATIENT ON FOLLOWING ANCEF, [facility] Performed at [facility] [address] [place], [place] [address]    Gram Stain   Final    NO WBC SEEN RARE GRAM POSITIVE COCCI IN PAIRS IN CLUSTERS    Culture   Final    FEW STAPHYLOCOCCUS AUREUS NO ANAEROBES ISOLATED Performed at [facility], [place] [address] [place], [place] [address]    Report Status 05/08/2020 FINAL  Final   Organism ID, Bacteria STAPHYLOCOCCUS AUREUS  Final      Susceptibility   Staphylococcus aureus - MIC*    CIPROFLOXACIN <=0.5 SENSITIVE Sensitive     ERYTHROMYCIN >=8 RESISTANT Resistant  GENTAMICIN <=0.5 SENSITIVE Sensitive     OXACILLIN 0.5 SENSITIVE  Sensitive     TETRACYCLINE <=1 SENSITIVE Sensitive     VANCOMYCIN 1 SENSITIVE Sensitive     TRIMETH/SULFA <=10 SENSITIVE Sensitive     CLINDAMYCIN <=0.25 SENSITIVE Sensitive     RIFAMPIN <=0.5 SENSITIVE Sensitive     Inducible Clindamycin NEGATIVE Sensitive     * FEW STAPHYLOCOCCUS AUREUS  Fungus Culture Result     Status: None   Collection Time: 05/03/20  9:38 AM  Result Value Ref Range Status   Result 1 Comment  Final    Comment: (NOTE) KOH/Calcofluor preparation:  no fungus observed. Performed At: [facility] [address] [place], [place] [id number] Rush Farmer MD [id number]   Culture, blood (Routine X 2) w Reflex to ID Panel     Status: None   Collection Time: 05/04/20  5:19 PM   Specimen: BLOOD  Result Value Ref Range Status   Specimen Description   Final    BLOOD RIGHT ANTECUBITAL Performed at [facility] [address] [place], [place] [address]    Special Requests   Final    BOTTLES DRAWN AEROBIC AND ANAEROBIC Blood Culture adequate volume Performed at [facility] [address], [place], [place] [address]    Culture   Final    NO GROWTH 5 DAYS Performed at [facility], [place] [address] [place], [place] [address]    Report Status 05/09/2020 FINAL  Final  Culture, blood (Routine X 2) w Reflex to ID Panel     Status: None   Collection Time: 05/04/20  5:19 PM   Specimen: BLOOD  Result Value Ref Range Status   Specimen Description   Final    BLOOD LEFT ANTECUBITAL Performed at [facility] [address] [place], [place] [address]    Special Requests   Final    BOTTLES DRAWN AEROBIC ONLY Blood Culture adequate volume Performed at [facility] [address] [place], [place] [address]    Culture   Final    NO GROWTH 5 DAYS Performed at [facility], [place] [address], [place], [place] [address]    Report Status 05/09/2020 FINAL  Final  Fungus Culture  With Stain     Status: None (Preliminary result)   Collection Time: 05/10/20  9:10 AM   Specimen: Soft Tissue, Other  Result Value Ref Range Status   Fungus Stain Final report  Final    Comment: (NOTE) Performed At: [facility] [place], [place] [id number] Rush Farmer MD [id number]    Fungus (Mycology) Culture PENDING  Incomplete   Fungal Source TRICUSPID VALVE VEGETATION  Final    Comment: Performed at [facility], [place] [address] [place], [place] [address]  Aerobic/Anaerobic Culture (surgical/deep wound)     Status: None   Collection Time: 05/10/20  9:10 AM   Specimen: Soft Tissue, Other  Result Value Ref Range Status   Specimen Description TISSUE  Final   Special Requests TRICUSPID VALVE VEGETATION  Final   Gram Stain   Final    RARE WBC PRESENT, PREDOMINANTLY PMN ABUNDANT GRAM POSITIVE COCCI IN PAIRS IN CLUSTERS    Culture   Final    ABUNDANT METHICILLIN RESISTANT STAPHYLOCOCCUS AUREUS NO ANAEROBES ISOLATED Performed at [facility], [place] [address], [place], [place] [address]    Report Status 05/15/2020 FINAL  Final   Organism ID, Bacteria METHICILLIN RESISTANT  STAPHYLOCOCCUS AUREUS  Final      Susceptibility   Methicillin resistant staphylococcus aureus - MIC*    CIPROFLOXACIN <=0.5 SENSITIVE Sensitive     ERYTHROMYCIN >=8 RESISTANT Resistant     GENTAMICIN <=0.5 SENSITIVE Sensitive     OXACILLIN >=4 RESISTANT Resistant     TETRACYCLINE <=1 SENSITIVE Sensitive     VANCOMYCIN 1 SENSITIVE Sensitive     TRIMETH/SULFA <=10 SENSITIVE Sensitive     CLINDAMYCIN <=0.25 SENSITIVE Sensitive     RIFAMPIN <=0.5 SENSITIVE Sensitive     Inducible Clindamycin NEGATIVE Sensitive     * ABUNDANT METHICILLIN RESISTANT STAPHYLOCOCCUS AUREUS  Acid Fast Smear (AFB)     Status: None   Collection Time: 05/10/20  9:10 AM   Specimen: Soft Tissue, Other  Result Value Ref Range Status   AFB Specimen Processing Concentration  Final   Acid Fast Smear  Negative  Final    Comment: (NOTE) Performed At: [facility] [address] [place], [place] [id number] Rush Farmer MD OV:[id number]    Source (AFB) TRICUSPID VALVE VEGETATION  Final    Comment: Performed at [facility], [place] [address], [place], [place] [address]  Fungus Culture Result     Status: None   Collection Time: 05/10/20  9:10 AM  Result Value Ref Range Status   Result 1 Comment  Final    Comment: (NOTE) KOH/Calcofluor preparation:  no fungus observed. Performed At: [facility] [place], [place] [id number] Rush Farmer MD BS:[id number]   Culture, respiratory (non-expectorated)     Status: None   Collection Time: 05/13/20  6:05 PM   Specimen: Tracheal Aspirate; Respiratory  Result Value Ref Range Status   Specimen Description TRACHEAL ASPIRATE  Final   Special Requests NONE  Final   Gram Stain   Final    NO WBC SEEN FEW GRAM POSITIVE COCCI Performed at [facility], [place] [address] [place], [place] [address]    Culture FEW METHICILLIN RESISTANT STAPHYLOCOCCUS AUREUS  Final   Report Status 05/15/2020 FINAL  Final   Organism ID, Bacteria METHICILLIN RESISTANT STAPHYLOCOCCUS AUREUS  Final      Susceptibility   Methicillin resistant staphylococcus aureus - MIC*    CIPROFLOXACIN <=0.5 SENSITIVE Sensitive     ERYTHROMYCIN >=8 RESISTANT Resistant     GENTAMICIN <=0.5 SENSITIVE Sensitive     OXACILLIN >=4 RESISTANT Resistant     TETRACYCLINE <=1 SENSITIVE Sensitive     VANCOMYCIN <=0.5 SENSITIVE Sensitive     TRIMETH/SULFA <=10 SENSITIVE Sensitive     CLINDAMYCIN <=0.25 SENSITIVE Sensitive     RIFAMPIN <=0.5 SENSITIVE Sensitive     Inducible Clindamycin NEGATIVE Sensitive     * FEW METHICILLIN RESISTANT STAPHYLOCOCCUS AUREUS  Culture, blood (routine x 2)     Status: Abnormal (Preliminary result)   Collection Time: 05/13/20  6:21 PM   Specimen: BLOOD  Result Value Ref Range Status   Specimen Description BLOOD LEFT  ANTECUBITAL  Final   Special Requests   Final    BOTTLES DRAWN AEROBIC AND ANAEROBIC Blood Culture adequate volume   Culture  Setup Time   Final    GRAM POSITIVE COCCI IN BOTH AEROBIC AND ANAEROBIC BOTTLES CRITICAL VALUE NOTED.  VALUE IS CONSISTENT WITH PREVIOUSLY REPORTED AND CALLED VALUE. Performed at [facility], [place] [address] [place], [place] [address]    Culture STAPHYLOCOCCUS AUREUS (A)  Final   Report Status PENDING  Incomplete  Culture, blood (routine x 2)  Status: Abnormal (Preliminary result)   Collection Time: 05/13/20  6:30 PM   Specimen: BLOOD LEFT FOREARM  Result Value Ref Range Status   Specimen Description BLOOD LEFT FOREARM  Final   Special Requests   Final    BOTTLES DRAWN AEROBIC AND ANAEROBIC Blood Culture adequate volume   Culture  Setup Time   Final    GRAM POSITIVE COCCI IN CLUSTERS ANAEROBIC BOTTLE ONLY CRITICAL RESULT CALLED TO, READ BACK BY AND VERIFIED WITH: PHARMD M Cooke [id number] AT 1441 BY CM    Culture (A)  Final    STAPHYLOCOCCUS AUREUS SUSCEPTIBILITIES TO FOLLOW Performed at [facility], [place] [address], [place], [place] [address]    Report Status PENDING  Incomplete  MRSA PCR Screening     Status: Abnormal   Collection Time: 05/13/20  7:45 PM   Specimen: Nasal Mucosa; Nasopharyngeal  Result Value Ref Range Status   MRSA by PCR POSITIVE (A) NEGATIVE Final    Comment:        The GeneXpert MRSA Assay (FDA approved for NASAL specimens only), is one component of a comprehensive MRSA colonization surveillance program. It is not intended to diagnose MRSA infection nor to guide or monitor treatment for MRSA infections. RESULT CALLED TO, READ BACK BY AND VERIFIED WITH: Bailey Mech RN 05/13/20 2117 JDW Performed at [facility] [address] [place], [place] [address]     Studies/Results: ECHOCARDIOGRAM LIMITED  Result Date: 05/14/2020    ECHOCARDIOGRAM REPORT   Patient Name:   Clifford Clark Date of Exam: 05/14/2020  Medical Rec #:  [id number]     Height:       67.0 in Accession #:    [id number]    Weight:       121.3 lb Date of Birth:  04/04/1991     BSA:          1.635 m Patient Age:    54 years      BP:           81/64 mmHg Patient Gender: M             HR:           99 bpm. Exam Location:  Inpatient Procedure: 2D Echo Indications:    Endocarditis I38  History:        Patient has prior history of Echocardiogram examinations, most                 recent 04/29/2020. Signs/Symptoms:Chest Pain; Risk Factors:Former                 Smoker. IVDA, MRSA, Endocarditits.  Sonographer:    Leavy Cella Referring Phys: [id number] RAVI AGARWALA IMPRESSIONS  1. Left ventricular ejection fraction, by estimation, is 20 to 25%. The left ventricle has severely decreased function. The left ventricle demonstrates global hypokinesis. Left ventricular diastolic function could not be evaluated.  2. Right ventricular systolic function is hyperdynamic. The right ventricular size is normal. There is normal pulmonary artery systolic pressure. The estimated right ventricular systolic pressure is 94.5 mmHg.  3. Cannot exclude tamponade physiology given the degree of cardiomyopathy. There is RV and RA diastolic collapse and the RV is hyperdynamic. The pericardial effusion is circumferential.  4. The mitral valve is abnormal. Mild to moderate mitral valve regurgitation.  5. Medium size 0.5 x 1.75 cm mobile filamentous mass on the tricupsid valve that prolapses across the valve plane, consistent with vegetation.. The tricuspid valve is abnormal. Tricuspid valve  regurgitation is severe.  6. The aortic valve is tricuspid. Aortic valve regurgitation is not visualized.  7. The inferior vena cava is normal in size with <50% respiratory variability, suggesting right atrial pressure of 8 mmHg. Comparison(s): Changes from prior study are noted, including further reduction in LVEF. 05/10/2020: LVEF 50-55%, 1-1.5 cm TV vegetation with severe TR, small pericardial effusion.  Findings d/w Dr. Lynetta Mare (PCCM). FINDINGS  Left Ventricle: Left ventricular ejection fraction, by estimation, is 20 to 25%. The left ventricle has severely decreased function. The left ventricle demonstrates global hypokinesis. The left ventricular internal cavity size was normal in size. There is no left ventricular hypertrophy. Left ventricular diastolic function could not be evaluated. Right Ventricle: The right ventricular size is normal. No increase in right ventricular wall thickness. Right ventricular systolic function is hyperdynamic. There is normal pulmonary artery systolic pressure. The tricuspid regurgitant velocity is 2.62 m/s, and with an assumed right atrial pressure of 8 mmHg, the estimated right ventricular systolic pressure is 17.7 mmHg. Left Atrium: Left atrial size was normal in size. Right Atrium: Right atrial size was normal in size. Pericardium: Cannot exclude tamponade physiology given the degree of cardiomyopathy. There is RV and RA diastolic collapse and the RV is hyperdynamic. A small pericardial effusion is present. The pericardial effusion is circumferential. Mitral Valve: The mitral valve is abnormal. There is mild thickening of the mitral valve leaflet(s). Mild to moderate mitral valve regurgitation. Tricuspid Valve: Medium size 0.5 x 1.75 cm mobile filamentous mass on the tricupsid valve that prolapses across the valve plane, consistent with vegetation. The tricuspid valve is abnormal. Tricuspid valve regurgitation is severe. Aortic Valve: The aortic valve is tricuspid. Aortic valve regurgitation is not visualized. Pulmonic Valve: The pulmonic valve was not well visualized. Pulmonic valve regurgitation is not visualized. Aorta: The aortic root was not well visualized. Venous: The inferior vena cava is normal in size with less than 50% respiratory variability, suggesting right atrial pressure of 8 mmHg. IAS/Shunts: No atrial level shunt detected by color flow Doppler.  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  He now has some neck pain where his central line is as well as new onset headache.  1.  MSSA (and likely occult MRSA as BCID picked up) bacteremia with tricuspid valve endocarditis and septic emboli: He underwent angio VAC.  Cultures from his valve yielded not just MSSA which grew phenotypically but also MRSA which actually had been identified initially on [facility] ID  We have changed him to vancomycin but in the interval he  deteriorated and was profoundly tachypneic   CT scan of his lungs revealed progressive cavitary pathology related to his septic embolization.  He did not have any pulmonary emboli.  He was broadened with meropenem on top of the vancomycin he has been receiving.  Tracheal cultures were taken and have already started to grow Staph aureus.  His 2D echocardiogram shows a mobile vegetation present on the tricuspid valve and a pericardial effusion with tamponade.  He is now growing Staph aureus yet again and I suspect it  will be MRSA.  He is now seeded to his central line in his PICC line and all plastic  He will line central line holiday when possible  Case discussed with Dr. Elsworth Soho who will discuss TEE, evaluation of effusion with Cardiology.   He is going to need a protracted course of therapy directed at [facility] but also the MRSA  #2 cavitary pneumonia this is all MSSA and MRSA pathology.  He is worried about how long he may be on the ventilator.   3.  Back pain: MRI did not show evidence of discitis in the thoracic or lumbar area but could have infection there and just not be showing up on MRI yet.  4.  For IV drug use: He will need plan for addressing this root cause   LOS: 18 days   Alcide Evener 05/16/2020, 11:09 AM

## 2020-05-16 NOTE — Consult Note (Addendum)
Advanced Heart Failure Team Consult Note   Primary Physician: Patient, [name] PCP-Cardiologist:  No primary care provider on file.  Reason for Consultation: Acute Systolic Heart failure, New Cardiomyopathy    HPI:    Clifford Clark is seen today for evaluation of acute systolic heart failure at the request of Dr. Lynetta Mare, PCCM.   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 Also found to have septic emboli to both lungs and kidneys + hand abscesses s/p I&D. Initial TTE 7/9 showed normal LVEF 50-55% w/ normal RV systolic function and mobile echobright lesion along atrial surface of tricuspid valve (14 mm long) consistent with vegetation. Subsequent TEE confirmed a large 1.5 x 2 cm mobile, filamentous vegetation w/ severe TR. There was also amall flickering leaflet mass, suggestive of vegetation of the pulmonic valve. No mitral or aortic valve pathology. Started on abx per ID and underwent Angiovac w/ CVTS on 7/20.   On 7/24, he developed acute hypoxic respiratory failure and septic shock requiring intubation and initiation of levophed. Repeat limited echo showed newly reduce LV systolic function, LVEF 96-29% w/ global hypokinesis. RV systolic function hyperdynamic. Severe TR. Small pericardial effusion. AHF team asked to evaluate.   He remains intubated. Awake and alert on vent. Hemodynamics improved. Off levophed.   FloTrac CI 6.5 CO 10.5 SVR 663 SV 86   MAPs in the 70s by A-line   Volume status ok, CVP ~7   Remains febrile. mTemp past 24 hrs 101.5. WBC trending down, 16.9>>11.9.   2D Echo 04/29/20 Left ventricular ejection fraction, by estimation, is 50 to 55%. The left ventricle has low normal function. The left ventricle has no regional wall motion abnormalities. Left ventricular diastolic parameters were normal. 2. Right ventricular systolic function is low normal. The right ventricular size is normal. 3. The  mitral valve is abnormal. Trivial mitral valve regurgitation. 4. Mobile echobright lesion along atrial surface of tricuspid valve (14 mm long) consistent with vegetation. REcommend TEE to further define . The tricuspid valve is abnormal. 5. The aortic valve is abnormal. Aortic valve regurgitation is not visualized. Mild to moderate aortic valve sclerosis/calcification is present, without any evidence of aortic stenosis. 6. Small echobright lesion appears associated with pulmonic valve, cannot exlude vegetation. 7. The inferior vena cava is normal in size with greater than 50% respiratory variability, suggesting right atrial pressure of 3 mmHg.  TEE 05/06/20  1. Left ventricular ejection fraction, by estimation, is 60 to 65%. The left ventricle has normal function. The left ventricle has no regional wall motion abnormalities. 2. Right ventricular systolic function is normal. The right ventricular size is normal. 3. No left atrial/left atrial appendage thrombus was detected. 4. Small to moderate sized pericardial effusion. The pericardial effusion is circumferential. There is no evidence of cardiac tamponade. 5. The mitral valve is grossly normal. Trivial mitral valve regurgitation. 6. Large 1.5 x 2 cm mobile, filamentous vegetation. The tricuspid valve is abnormal. Tricuspid valve regurgitation is severe. 7. The aortic valve is tricuspid. Aortic valve regurgitation is not visualized. 8. The pulmonic valve was abnormal. 9. Small flickering leaflet mass, suggestive of vegetation of the pulmonic valve. Trivial PI.   2D Echo 05/14/20 Left ventricular ejection fraction, by estimation, is 20 to 25%. The left ventricle has severely decreased function. The left ventricle demonstrates global hypokinesis. Left ventricular diastolic function could not be evaluated. 2. Right ventricular systolic function is hyperdynamic. The right ventricular size is  normal. There is normal pulmonary artery systolic  pressure. The estimated right ventricular systolic pressure is 37.1 mmHg. 3. Cannot exclude tamponade physiology given the degree of cardiomyopathy. There is RV and RA diastolic collapse and the RV is hyperdynamic. The pericardial effusion is circumferential. 4. The mitral valve is abnormal. Mild to moderate mitral valve regurgitation. 5. Medium size 0.5 x 1.75 cm mobile filamentous mass on the tricupsid valve that prolapses across the valve plane, consistent with vegetation.. The tricuspid valve is abnormal. Tricuspid valve regurgitation is severe. 6. The aortic valve is tricuspid. Aortic valve regurgitation is not visualized. 7. The inferior vena cava is normal in size with <50% respiratory variability, suggesting right atrial pressure of 8 mmHg.  Review of Systems: [y] = yes, [ ] = no   . General: Weight gain [ ]; Weight loss [ ]; Anorexia [ ]; Fatigue [ ]; Fever [ Y]; Chills [ ]; Weakness [ ]  . Cardiac: Chest pain/pressure [ ]; Resting SOB [ ]; Exertional SOB [ ]; Orthopnea [ ]; Pedal Edema [ ]; Palpitations [ ]; Syncope [ Y]; Presyncope [ ]; Paroxysmal nocturnal dyspnea[ ]  . Pulmonary: Cough [ ]; Wheezing[ ]; Hemoptysis[ ]; Sputum [ ]; Snoring [ ]  . GI: Vomiting[ ]; Dysphagia[ ]; Melena[ ]; Hematochezia [ ]; Heartburn[ ]; Abdominal pain [ ]; Constipation [ ]; Diarrhea [ ]; BRBPR [ ]  . GU: Hematuria[ ]; Dysuria [ ]; Nocturia[ ]  . Vascular: Pain in legs with walking [ ]; Pain in feet with lying flat [ ]; Non-healing sores [ ]; Stroke [ ]; TIA [ ]; Slurred speech [ ];  . Neuro: Headaches[ ]; Vertigo[ ]; Seizures[ ]; Paresthesias[ ];Blurred vision [ ]; Diplopia [ ]; Vision changes [ ]  . Ortho/Skin: Arthritis [ ]; Joint pain [ ]; Muscle pain [ ]; Joint swelling [ ]; Back Pain [ ]; Rash [ ]  . Psych: Depression[ ]; Anxiety[ ]  . Heme: Bleeding problems [ ]; Clotting disorders [ ]; Anemia [ ]  . Endocrine: Diabetes [ ]; Thyroid dysfunction[ ]  Home Medications Prior to Admission  medications   Medication Sig Start Date End Date Taking? Authorizing Provider  naloxone [facility]) 0.4 MG/ML injection Take as needed for accidental opiate overdose Patient taking differently: Place 0.4 mg into the nose as needed (overdose). Take as needed for accidental opiate overdose 10/07/19   Duffy Bruce, MD    Past Medical History: Past Medical History:  Diagnosis Date  . Asthma   . Opiate abuse, continuous ([place])    Heroin    Past Surgical History: Past Surgical History:  Procedure Laterality Date  . APPLICATION OF ANGIOVAC N/A 05/10/2020   Procedure: APPLICATION OF ANGIOVAC;  Surgeon: Lajuana Matte, MD;  Location: [place];  Service: Vascular;  Laterality: N/A;  . BUBBLE STUDY  05/06/2020   Procedure: BUBBLE STUDY;  Surgeon: Pixie Casino, MD;  Location: [place];  Service: Cardiovascular;;  . I & D EXTREMITY Bilateral 05/03/2020   Procedure: BILATERAL IRRIGATION AND DEBRIDEMENT HANDS;  Surgeon: Renette Butters, MD;  Location: [facility];  Service: Orthopedics;  Laterality: Bilateral;  . TEE WITHOUT CARDIOVERSION N/A 05/06/2020   Procedure: TRANSESOPHAGEAL ECHOCARDIOGRAM (TEE);  Surgeon: Pixie Casino, MD;  Location[facility] END[facility];  Service: Cardiovascular;  Laterality: N/A;  . TEE WITHOUT CARDIOVERSION N/A 05/10/2020   Procedure: TRANSESOPHAGEAL ECHOCARDIOGRAM (TEE);  Surgeon: Lajuana Matte, MD;  Location: [place];  Service: Thoracic;  Laterality: N/A;    Family History:  History reviewed. No pertinent family history.  Social History: Social History   Socioeconomic History  . Marital status: Single    Spouse name: Not on file  . Number of children: Not on file  . Years of education: Not on file  . Highest education level: Not on file  Occupational History  . Not on file  Tobacco Use  . Smoking status: Former Smoker    Packs/day: 0.50  . Smokeless tobacco: Never Used  . Tobacco comment: using nicotine patches  Vaping Use  . Vaping Use: Never used    Substance and Sexual Activity  . Alcohol use: Not Currently    Comment: rarely  . Drug use: Not Currently    Types: Marijuana, IV, "Crack" cocaine, Cocaine, Fentanyl, Methamphetamines, Amphetamines    Comment: Heroin  . Sexual activity: Yes  Other Topics Concern  . Not on file  Social History Narrative  . Not on file   Social Determinants of Health   Financial Resource Strain:   . Difficulty of Paying Living Expenses:   Food Insecurity:   . Worried About Charity fundraiser in the Last Year:   . Arboriculturist in the Last Year:   Transportation Needs:   . Film/video editor (Medical):   Marland Kitchen Lack of Transportation (Non-Medical):   Physical Activity:   . Days of Exercise per Week:   . Minutes of Exercise per Session:   Stress:   . Feeling of Stress :   Social Connections:   . Frequency of Communication with Friends and Family:   . Frequency of Social Gatherings with Friends and Family:   . Attends Religious Services:   . Active Member of Clubs or Organizations:   . Attends Archivist Meetings:   Marland Kitchen Marital Status:     Allergies:  No Known Allergies  Objective:    Vital Signs:   Temp:  [98.8 F (37.1 C)-101.5 F (38.6 C)] 101.5 F (38.6 C) (07/26 1240) Pulse Rate:  [96-123] 123 (07/26 1300) Resp:  [19-32] 19 (07/26 1300) BP: (84-124)/(59-87) 112/85 (07/26 1300) SpO2:  [95 %-100 %] 100 % (07/26 1300) Arterial Line BP: (97-147)/(53-89) 131/77 (07/26 1300) FiO2 (%):  [40 %] 40 % (07/26 1300) Weight:  [66.1 kg] 66.1 kg (07/26 0446) Last BM Date: 05/07/20  Weight change: Filed Weights   04/28/20 2130 05/15/20 0530 05/16/20 0446  Weight: 55 kg 65.3 kg 66.1 kg    Intake/Output:   Intake/Output Summary (Last 24 hours) at 05/16/2020 1455 Last data filed at 05/16/2020 1300 Gross per 24 hour  Intake 2401.91 ml  Output 1725 ml  Net 676.91 ml      Physical Exam    CVP 7  General:  Young WM, intubated, awake, communicates through writting  HEENT:  normal +ETT Neck: supple. No JVP . Carotids 2+ bilat; no bruits. No lymphadenopathy or thyromegaly appreciated. Cor: PMI nondisplaced. Reg rhythm, tachy rate. 3/6 TR murmur  Lungs: intubated, course bilaterally  Abdomen: soft, nontender, nondistended. No hepatosplenomegaly. No bruits or masses. Good bowel sounds. Extremities: no cyanosis, clubbing, rash, edema  Stitches on left hand  Neuro: intubated, awake on vent, interactive   Telemetry   Sinus tach low 100s-110s   EKG    No new EKG to review today   Labs   Basic Metabolic Panel: Recent Labs  Lab 05/13/20 0839 05/13/20 0839 05/13/20 1815 05/13/20 1834 05/13/20 1834 05/14/20 1617 05/15/20 0414 05/15/20 1714 05/16/20 0405  NA 132*   < >  133* 131*  --  137 139  --  140  K 4.1   < > 4.2 4.1  --  3.6 3.6  --  4.0  CL 97*  --   --  98  --  104 106  --  107  CO2 27  --   --  24  --  25 24  --  27  GLUCOSE 117*  --   --  163*  --  120* 136*  --  110*  BUN 8  --   --  13  --  19 21*  --  20  CREATININE 0.70  --   --  0.71  --  0.64 0.59*  --  0.55*  CALCIUM 7.5*   < >  --  7.4*   < > 7.1* 7.3*  --  7.7*  MG 1.8  --   --  1.9  --  2.0 2.1 2.1  --   PHOS  --   --   --  4.1  --  3.8 3.0 2.3*  --    < > = values in this interval not displayed.    Liver Function Tests: Recent Labs  Lab 05/12/20 0500 05/13/20 0839 05/13/20 1834 05/15/20 0414 05/16/20 0405  AST 105* 37 39 20 24  ALT 83* 40 41 25 25  ALKPHOS 124 110 120 98 144*  BILITOT 0.6 0.6 0.8 0.5 0.8  PROT 5.8* 6.2* 6.3* 5.7* 5.9*  ALBUMIN 1.5* 1.4* 1.5* 1.3* 1.5*   No results for input(s): LIPASE, AMYLASE in the last 168 hours. No results for input(s): AMMONIA in the last 168 hours.  CBC: Recent Labs  Lab 05/12/20 0500 05/12/20 0500 05/13/20 0839 05/13/20 1815 05/13/20 1834 05/15/20 0414 05/16/20 0405  WBC 13.3*  --  17.8*  --  15.7* 16.9* 11.9*  NEUTROABS  --   --  14.9*  --   --   --   --   HGB 8.4*   < > 8.1* 9.5* 8.5* 8.0* 7.5*  HCT 25.6*   < >  24.5* 28.0* 26.2* 24.3* 24.0*  MCV 89.2  --  88.8  --  90.0 90.0 90.2  PLT 145*  --  124*  --  123* 128* 133*   < > = values in this interval not displayed.    Cardiac Enzymes: No results for input(s): CKTOTAL, CKMB, CKMBINDEX, TROPONINI in the last 168 hours.  BNP: BNP (last 3 results) No results for input(s): BNP in the last 8760 hours.  ProBNP (last 3 results) No results for input(s): PROBNP in the last 8760 hours.   CBG: Recent Labs  Lab 05/15/20 1551 05/15/20 2003 05/16/20 0037 05/16/20 0410 05/16/20 1247  GLUCAP 96 91 91 93 107*    Coagulation Studies: Recent Labs    05/15/20 0414  LABPROT 16.5*  INR 1.4*     Imaging    No results found.   Medications:     Current Medications: . chlorhexidine gluconate (MEDLINE KIT)  15 mL Mouth Rinse BID  . Chlorhexidine Gluconate Cloth  6 each Topical Q0600  . docusate  100 mg Per Tube BID  . enoxaparin (LOVENOX) injection  40 mg Subcutaneous Q24H  . feeding supplement (PROSource TF)  45 mL Per Tube BID  . mouth rinse  15 mL Mouth Rinse 10 times per day  . mupirocin ointment  1 application Nasal BID  . pantoprazole sodium  20 mg Per Tube Daily  . polyethylene glycol  17 g Per  Tube Daily  . sodium chloride flush  10-40 mL Intracatheter Q12H  . sodium chloride flush  3 mL Intravenous Q12H     Infusions: . sodium chloride    . feeding supplement (VITAL AF 1.2 CAL)    . fentaNYL infusion INTRAVENOUS 175 mcg/hr (05/16/20 1300)  . propofol (DIPRIVAN) infusion 30 mcg/kg/min (05/16/20 1309)  . vancomycin Stopped (05/16/20 1027)      Assessment/Plan   1. 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 There was also amall flickering leaflet mass, suggestive of vegetation of the pulmonic valve. No mitral or aortic valve pathology. - s/p Angiovac debulking on 7/20  - abx  per ID, currently on vancomycin   2. Acute Hypoxic Respiratory Failure - vent management per PCCM   3. Septic Shock  - 2/2 MRSA Bacteremia - improving, now off pressors. MAPs stable in the 70s by a-line off NE  - remains febrile but WBCs down trending - continue abx per ID  3. Acute Systolic Heart Failure - initial Echo 7/9 w/ normal LVEF 50-55%. RV normal - TEE 7/16, LVEF normal 60-65%, RV normal - Limited Echo 7/24, LVEF severely reduced 78-67%, RV systolic function hyperdynamic. MV w/ mild-mod MR but no seen vegetation  - suspect acute drop in EF stress induced from septic shock. Doubt CAD - CO/CI ok by FloTrac analysis  - septic shock improving. Now off NE  - Volume status stable, CVP 7. No diuretic requirements currently  - BP too soft for initiation of GDMT currently  - continue supportive care/ management of MRSA bacteremia/ Endocarditis  - we will continue to follow along  - continue CVPs to monitor fluid status     Length of Stay: [address] PA-C  05/16/2020, 2:55 PM  Advanced Heart Failure Team Pager [phone] (M-F; [place])  Please contact [facility] for night-coverage after hours (4p -7a ) and weekends on [email or web address]  Agree with above.   29 y/o male with IVDA admitted after being found down on the side of the road. ER w/u showed multiple septic emboli to lungs with cavitary lesions. 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  Also found to have emboli to kidneys and hands. TEE with EF 60-65% small PFO. large TV vegetation with severe TR. No vegetations on Aov/MV despite left-sided emboli.  Underwent AngioJet removal of part of vegetation. Then developed MRSA sepsis and respiratory failure. Intubated and started on pressors. F/u echo LVEF 20-25%. RV normal.  hstrop 192 -> 190   Co-ox 72%  Awake on vent . Hemodynamics improved  General:  Ill appearing. Young male on vent HEENT: normal Neck: supple. no JVD. Carotids 2+ bilat; no bruits. No  lymphadenopathy or thryomegaly appreciated. Cor: PMI nondisplaced. Regular rate & rhythm. 2/6 TR Lungs: rhonchorous  Abdomen: soft, nontender, nondistended. No hepatosplenomegaly. No bruits or masses. Good bowel sounds. Extremities: no cyanosis, clubbing, rash, edema Neuro: awake on vent follows commands cranial nerves grossly intact. moves all 4 extremities w/o difficulty. Affect pleasant  Agree that LV dysfunction is likely due to septic CM. Agree with continuing supportive care with inotrope support/pressors. Add HF meds as tolerated. Continue abx. Will eventually need TV repair if he is a candidate. I am suprised by lack of left-sided valvular lesions in setting of systemic emboli.   CRITICAL CARE Performed by: Glori Bickers  Total  critical care time: 45 minutes  Critical care time was exclusive of separately billable procedures and treating other patients.  Critical care was necessary to treat or prevent imminent or life-threatening deterioration.  Critical care was time spent personally by me (independent of midlevel providers or residents) on the following activities: development of treatment plan with patient and/or surrogate as well as nursing, discussions with consultants, evaluation of patient's response to treatment, examination of patient, obtaining history from patient or surrogate, ordering and performing treatments and interventions, ordering and review of laboratory studies, ordering and review of radiographic studies, pulse oximetry and re-evaluation of patient's condition.  Glori Bickers, MD  5:35 PM

## 2020-05-16 NOTE — Progress Notes (Addendum)
Nutrition Follow-up  DOCUMENTATION CODES:   Non-severe (moderate) malnutrition in context of social or environmental circumstances  INTERVENTION:   D/C liquid MVI given high osmolality   Tube feeding:  -Vital AF 1.2 @ 40 ml/hr via OG -Increase by 10 ml Q4 hours to goal rate of 60 ml/hr (1440 ml) -45 ml ProSource TF BID  Provides: 1808 kcals (2027 kcal with propofol), 130 grams protein, 1168 ml free water.   NUTRITION DIAGNOSIS:   Moderate Malnutrition related to social / environmental circumstances as evidenced by energy intake < 75% for > or equal to 3 months, moderate fat depletion, moderate muscle depletion, percent weight loss.  Ongoing  GOAL:   Patient will meet greater than or equal to 90% of their needs  Addressed via TF  MONITOR:   PO intake, Supplement acceptance, Labs, Weight trends  REASON FOR ASSESSMENT:   Consult Assessment of nutrition requirement/status  ASSESSMENT:   Pt admitted for multiple septic emboli suspicious for possible endocarditis in the setting of IV drug use resulting in sepsis. PMH significant for polysubstance abuse.  7/13 - s/p I&D of bilateral hand abscesses 7/16 - s/p TEE confirming TV vegetation and trace pulmonic valve vegetation 7/20 - s/p angiovac debridement of tricuspid valve 7/23- intubated   Pt discussed during ICU rounds and with RN.   Off pressors. Febrile. Agitated once propofol weaned. Tolerating Vital High Protein @ 40 ml/hr. Change formula to better meet needs. Xray confirmed OG in stomach. No BM x 9 days. Regimen in place.   Admission weight: 55 kg (stated?) Current weight: 66.1 kg   Patient is currently intubated on ventilator support MV: 9.6 L/min Temp (24hrs), Avg:100 F (37.8 C), Min:98.8 F (37.1 C), Max:101.5 F (38.6 C)  Propofol: 8.3 ml/hr- provides 219 kcal from lipids daily   I/O: -3,565 ml since 7/12 UOP: 1,150 ml x 24 hrs   Drips: propofol  Medications: colace, miralax Labs: Phosphorus 2.3  (L) CBG 91-130  Diet Order:   Diet Order            Diet NPO time specified  Diet effective now                 EDUCATION NEEDS:   Not appropriate for education at this time  Skin:  Skin Assessment: Skin Integrity Issues: Stage II: coccyx Incisions: bilateral arms, groin, neck  Last BM:  05/07/20  Height:   Ht Readings from Last 1 Encounters:  05/13/20 5\' 7"  (1.702 m)    Weight:   Wt Readings from Last 1 Encounters:  05/16/20 66.1 kg    BMI:  Body mass index is 22.82 kg/m.  Estimated Nutritional Needs:   Kcal:  1920 kcal  Protein:  100-130 grams  Fluid:  >/= 1.9 L/day   05/18/20 RD, LDN Clinical Nutrition Pager listed in AMION

## 2020-05-17 ENCOUNTER — Inpatient Hospital Stay (HOSPITAL_COMMUNITY): Payer: Self-pay

## 2020-05-17 LAB — COMPREHENSIVE METABOLIC PANEL
ALT: 24 U/L (ref 0–44)
AST: 21 U/L (ref 15–41)
Albumin: 1.5 g/dL — ABNORMAL LOW (ref 3.5–5.0)
Alkaline Phosphatase: 150 U/L — ABNORMAL HIGH (ref 38–126)
Anion gap: 8 (ref 5–15)
BUN: 15 mg/dL (ref 6–20)
CO2: 26 mmol/L (ref 22–32)
Calcium: 7.9 mg/dL — ABNORMAL LOW (ref 8.9–10.3)
Chloride: 108 mmol/L (ref 98–111)
Creatinine, Ser: 0.58 mg/dL — ABNORMAL LOW (ref 0.61–1.24)
GFR calc Af Amer: >60 mL/min (ref >60)
GFR calc non Af Amer: >60 mL/min (ref >60)
Glucose, Bld: 115 mg/dL — ABNORMAL HIGH (ref 70–99)
Potassium: 3.6 mmol/L (ref 3.5–5.1)
Sodium: 142 mmol/L (ref 135–145)
Total Bilirubin: 0.9 mg/dL (ref 0.3–1.2)
Total Protein: 5.7 g/dL — ABNORMAL LOW (ref 6.5–8.1)

## 2020-05-17 LAB — CBC
HCT: 23.5 % — ABNORMAL LOW (ref 39.0–52.0)
Hemoglobin: 7.2 g/dL — ABNORMAL LOW (ref 13.0–17.0)
MCH: 28.3 pg (ref 26.0–34.0)
MCHC: 30.6 g/dL (ref 30.0–36.0)
MCV: 92.5 fL (ref 80.0–100.0)
Platelets: 137 10*3/uL — ABNORMAL LOW (ref 150–400)
RBC: 2.54 MIL/uL — ABNORMAL LOW (ref 4.22–5.81)
RDW: 15.3 % (ref 11.5–15.5)
WBC: 10.5 10*3/uL (ref 4.0–10.5)
nRBC: 0 % (ref 0.0–0.2)

## 2020-05-17 LAB — COOXEMETRY PANEL
Carboxyhemoglobin: 1.2 % (ref 0.5–1.5)
Methemoglobin: 1.1 % (ref 0.0–1.5)
O2 Saturation: 58.2 %
Total hemoglobin: 8.1 g/dL — ABNORMAL LOW (ref 12.0–16.0)

## 2020-05-17 LAB — TRIGLYCERIDES: Triglycerides: 171 mg/dL — ABNORMAL HIGH (ref <150)

## 2020-05-17 LAB — GLUCOSE, CAPILLARY
Glucose-Capillary: 103 mg/dL — ABNORMAL HIGH (ref 70–99)
Glucose-Capillary: 122 mg/dL — ABNORMAL HIGH (ref 70–99)
Glucose-Capillary: 102 mg/dL — ABNORMAL HIGH (ref 70–99)
Glucose-Capillary: 77 mg/dL (ref 70–99)

## 2020-05-17 MED ORDER — CHLORHEXIDINE GLUCONATE CLOTH 2 % EX PADS
6.0000 | MEDICATED_PAD | Freq: Every day | CUTANEOUS | Status: DC
  Administered 2020-05-17 – 2020-05-29 (×8): 6 via TOPICAL

## 2020-05-17 MED ORDER — FUROSEMIDE 10 MG/ML IJ SOLN: 40.0000 mg | Freq: Once | INTRAMUSCULAR | Status: DC

## 2020-05-17 MED ORDER — POTASSIUM CHLORIDE 20 MEQ/15ML (10%) PO SOLN
40.0000 meq | ORAL | Status: AC
  Administered 2020-05-17 (×2): 40 meq via ORAL
  Filled 2020-05-17 (×2): qty 30

## 2020-05-17 MED ORDER — SPIRONOLACTONE 12.5 MG HALF TABLET
12.5000 mg | ORAL_TABLET | Freq: Every day | ORAL | Status: DC
  Administered 2020-05-17: 12.5 mg
  Filled 2020-05-17: qty 1

## 2020-05-17 MED ORDER — FUROSEMIDE 10 MG/ML IJ SOLN
40.0000 mg | Freq: Two times a day (BID) | INTRAMUSCULAR | Status: DC
  Administered 2020-05-17 – 2020-05-19 (×5): 40 mg via INTRAVENOUS
  Filled 2020-05-17 (×5): qty 4

## 2020-05-17 NOTE — Plan of Care (Signed)
Pt successfully unrestrained, appropriate, cooperative on diprivan and fentanyl gtts, responds well to reassurance.   Problem: Coping: Goal: Level of anxiety will decrease Outcome: Progressing

## 2020-05-17 NOTE — Progress Notes (Signed)
CSW received consult for substance abuse counseling/education for patient. CSW unable to offer resources to patient at this time.CSW will continue to follow to offer resources to patient. TOC team will continue to follow.

## 2020-05-17 NOTE — Progress Notes (Signed)
Subjective:  Sedated currently   Antibiotics:  Anti-infectives (From admission, onward)   Start     Dose/Rate Route Frequency Ordered Stop   05/13/20 1730  meropenem (MERREM) 1 g in sodium chloride 0.9 % 100 mL IVPB  Status:  Discontinued        1 g 200 mL/hr over 30 Minutes Intravenous Every 8 hours 05/13/20 1720 05/14/20 1154   05/12/20 1600  vancomycin (VANCOREADY) IVPB 750 mg/150 mL     Discontinue     750 mg 150 mL/hr over 60 Minutes Intravenous Every 8 hours 05/12/20 1509     05/05/20 1600  ceFAZolin (ANCEF) IVPB 2g/100 mL premix  Status:  Discontinued        2 g 200 mL/hr over 30 Minutes Intravenous Every 8 hours 05/05/20 1452 05/12/20 1509   05/04/20 1200  vancomycin (VANCOCIN) IVPB 1000 mg/200 mL premix  Status:  Discontinued        1,000 mg 200 mL/hr over 60 Minutes Intravenous Every 12 hours 05/04/20 1045 05/04/20 1055   05/04/20 1200  vancomycin (VANCOREADY) IVPB 750 mg/150 mL  Status:  Discontinued        750 mg 150 mL/hr over 60 Minutes Intravenous Every 8 hours 05/04/20 1055 05/05/20 1452   05/03/20 0945  vancomycin (VANCOCIN) powder  Status:  Discontinued          As needed 05/03/20 0945 05/03/20 1057   05/03/20 0920  ceFAZolin (ANCEF) 2-4 GM/100ML-% IVPB       Note to Pharmacy: Charmayne Sheer   : cabinet override      05/03/20 0920 05/03/20 2129   05/02/20 1500  vancomycin (VANCOREADY) IVPB 750 mg/150 mL  Status:  Discontinued        750 mg 150 mL/hr over 60 Minutes Intravenous Every 12 hours 05/02/20 1425 05/04/20 1045   05/01/20 1330  vancomycin variable dose per unstable renal function (pharmacist dosing)  Status:  Discontinued         Does not apply See admin instructions 05/01/20 1330 05/03/20 0810   04/29/20 1000  vancomycin (VANCOREADY) IVPB 750 mg/150 mL  Status:  Discontinued        750 mg 150 mL/hr over 60 Minutes Intravenous Every 8 hours 04/29/20 0834 05/01/20 1329   04/29/20 0400  metroNIDAZOLE (FLAGYL) IVPB 500 mg  Status:   Discontinued        500 mg 100 mL/hr over 60 Minutes Intravenous Every 8 hours 04/28/20 2122 04/29/20 0819   04/29/20 0400  ceFEPIme (MAXIPIME) 2 g in sodium chloride 0.9 % 100 mL IVPB  Status:  Discontinued        2 g 200 mL/hr over 30 Minutes Intravenous Every 8 hours 04/28/20 2138 04/29/20 0819   04/29/20 0100  vancomycin (VANCOCIN) IVPB 1000 mg/200 mL premix  Status:  Discontinued        1,000 mg 200 mL/hr over 60 Minutes Intravenous Every 8 hours 04/28/20 2146 04/29/20 0833   04/28/20 1715  ceFEPIme (MAXIPIME) 2 g in sodium chloride 0.9 % 100 mL IVPB        2 g 200 mL/hr over 30 Minutes Intravenous  Once 04/28/20 1708 04/28/20 1903   04/28/20 1715  metroNIDAZOLE (FLAGYL) IVPB 500 mg        500 mg 100 mL/hr over 60 Minutes Intravenous  Once 04/28/20 1708 04/28/20 2111   04/28/20 1715  vancomycin (VANCOCIN) IVPB 1000 mg/200 mL premix        1,000 mg  200 mL/hr over 60 Minutes Intravenous  Once 04/28/20 1708 04/28/20 1947      Medications: Scheduled Meds: . chlorhexidine gluconate (MEDLINE KIT)  15 mL Mouth Rinse BID  . Chlorhexidine Gluconate Cloth  6 each Topical Daily  . docusate  100 mg Per Tube BID  . enoxaparin (LOVENOX) injection  40 mg Subcutaneous Q24H  . feeding supplement (PROSource TF)  45 mL Per Tube BID  . furosemide  40 mg Intravenous BID  . mouth rinse  15 mL Mouth Rinse 10 times per day  . mupirocin ointment  1 application Nasal BID  . pantoprazole sodium  20 mg Per Tube Daily  . polyethylene glycol  17 g Per Tube Daily  . potassium chloride  40 mEq Oral Q4H  . sodium chloride flush  10-40 mL Intracatheter Q12H  . sodium chloride flush  3 mL Intravenous Q12H  . spironolactone  12.5 mg Per Tube Daily   Continuous Infusions: . sodium chloride    . feeding supplement (VITAL AF 1.2 CAL) 1,000 mL (05/16/20 1606)  . fentaNYL infusion INTRAVENOUS 175 mcg/hr (05/17/20 1000)  . propofol (DIPRIVAN) infusion 30 mcg/kg/min (05/17/20 1000)  . vancomycin Stopped  (05/17/20 0847)   PRN Meds:.acetaminophen (TYLENOL) oral liquid 160 mg/5 mL, acetaminophen **OR** acetaminophen, fentaNYL, ipratropium-albuterol, LORazepam, metoprolol tartrate, ondansetron **OR** ondansetron (ZOFRAN) IV, ondansetron, sodium chloride flush    Objective: Weight change: 0.9 kg  Intake/Output Summary (Last 24 hours) at 05/17/2020 1118 Last data filed at 05/17/2020 1000 Gross per 24 hour  Intake 2412.19 ml  Output 2000 ml  Net 412.19 ml   Blood pressure 122/81, pulse 86, temperature 99.1 F (37.3 C), temperature source Rectal, resp. rate 23, height '5\' 7"'  (1.702 m), weight 67 kg, SpO2 100 %. Temp:  [98.8 F (37.1 C)-101.5 F (38.6 C)] 99.1 F (37.3 C) (07/27 0800) Pulse Rate:  [86-123] 86 (07/27 1000) Resp:  [15-32] 23 (07/27 1000) BP: (93-124)/(60-87) 122/81 (07/27 1000) SpO2:  [97 %-100 %] 100 % (07/27 1000) Arterial Line BP: (97-139)/(62-89) 97/62 (07/26 1600) FiO2 (%):  [40 %] 40 % (07/27 0820) Weight:  [67 kg] 67 kg (07/27 0600)  Physical Exam: General: On the ventilator  sedated CVS tachycardic, could not appreciate murmurs Chest: Rhonchorous Abdomen: soft non-distended,  Extremities: He has some edema bilaterally Skin: no rashes Neuro: nonfocal  CBC:    BMET Recent Labs    05/16/20 0405 05/17/20 0425  NA 140 142  K 4.0 3.6  CL 107 108  CO2 27 26  GLUCOSE 110* 115*  BUN 20 15  CREATININE 0.55* 0.58*  CALCIUM 7.7* 7.9*     Liver Panel  Recent Labs    05/16/20 0405 05/17/20 0425  PROT 5.9* 5.7*  ALBUMIN 1.5* 1.5*  AST 24 21  ALT 25 24  ALKPHOS 144* 150*  BILITOT 0.8 0.9       Sedimentation Rate No results for input(s): ESRSEDRATE in the last 72 hours. C-Reactive Protein No results for input(s): CRP in the last 72 hours.  Micro Results: Recent Results (from the past 720 hour(s))  Culture, blood (routine x 2)     Status: Abnormal   Collection Time: 04/28/20  3:45 PM   Specimen: BLOOD  Result Value Ref Range Status    Specimen Description   Final    BLOOD LEFT ANTECUBITAL Performed at [facility] [address] [place], [place] [address]    Special Requests   Final    BOTTLES DRAWN AEROBIC AND ANAEROBIC Blood  Culture adequate volume Performed at [facility] [address] [place], [place] [address]    Culture  Setup Time   Final    GRAM POSITIVE COCCI IN BOTH AEROBIC AND ANAEROBIC BOTTLES CRITICAL RESULT CALLED TO, READ BACK BY AND VERIFIED WITH: PHARMD M. Lear Ng [phone] FCP    Culture (A)  Final    STAPHYLOCOCCUS AUREUS METHICILLIN RESISTANT ORGANISM WAS NOT RECOVERED IN CULTURE. REPEATED FOR CONFIRMATION Performed at [facility], [place] [address] [place], [place] [address]    Report Status 05/03/2020 FINAL  Final   Organism ID, Bacteria STAPHYLOCOCCUS AUREUS  Final      Susceptibility   Staphylococcus aureus - MIC*    CIPROFLOXACIN <=0.5 SENSITIVE Sensitive     ERYTHROMYCIN >=8 RESISTANT Resistant     GENTAMICIN <=0.5 SENSITIVE Sensitive     OXACILLIN 0.5 SENSITIVE Sensitive     TETRACYCLINE <=1 SENSITIVE Sensitive     VANCOMYCIN <=0.5 SENSITIVE Sensitive     TRIMETH/SULFA <=10 SENSITIVE Sensitive     CLINDAMYCIN <=0.25 SENSITIVE Sensitive     RIFAMPIN <=0.5 SENSITIVE Sensitive     Inducible Clindamycin NEGATIVE Sensitive     * STAPHYLOCOCCUS AUREUS  Blood Culture ID Panel (Reflexed)     Status: Abnormal   Collection Time: 04/28/20  3:45 PM  Result Value Ref Range Status   Enterococcus species NOT DETECTED NOT DETECTED Final   Listeria monocytogenes NOT DETECTED NOT DETECTED Final   Staphylococcus species DETECTED (A) NOT DETECTED Final    Comment: CRITICAL RESULT CALLED TO, READ BACK BY AND VERIFIED WITH: PHARMD Chales Abrahams [id number] [phone] FCP    Staphylococcus aureus (BCID) DETECTED (A) NOT DETECTED Final    Comment: Methicillin (oxacillin)-resistant Staphylococcus aureus (MRSA). MRSA is predictably resistant to beta-lactam antibiotics  (except ceftaroline). Preferred therapy is vancomycin unless clinically contraindicated. Patient requires contact precautions if  hospitalized. CRITICAL RESULT CALLED TO, READ BACK BY AND VERIFIED WITH: PHARMD M. Lear Ng [phone] FCP    Methicillin resistance DETECTED (A) NOT DETECTED Final    Comment: CRITICAL RESULT CALLED TO, READ BACK BY AND VERIFIED WITH: PHARMD M. RENZ [id number] [phone] FCP    Streptococcus species NOT DETECTED NOT DETECTED Final   Streptococcus agalactiae NOT DETECTED NOT DETECTED Final   Streptococcus pneumoniae NOT DETECTED NOT DETECTED Final   Streptococcus pyogenes NOT DETECTED NOT DETECTED Final   Acinetobacter baumannii NOT DETECTED NOT DETECTED Final   Enterobacteriaceae species NOT DETECTED NOT DETECTED Final   Enterobacter cloacae complex NOT DETECTED NOT DETECTED Final   Escherichia coli NOT DETECTED NOT DETECTED Final   Klebsiella oxytoca NOT DETECTED NOT DETECTED Final   Klebsiella pneumoniae NOT DETECTED NOT DETECTED Final   Proteus species NOT DETECTED NOT DETECTED Final   Serratia marcescens NOT DETECTED NOT DETECTED Final   Haemophilus influenzae NOT DETECTED NOT DETECTED Final   Neisseria meningitidis NOT DETECTED NOT DETECTED Final   Pseudomonas aeruginosa NOT DETECTED NOT DETECTED Final   Candida albicans NOT DETECTED NOT DETECTED Final   Candida glabrata NOT DETECTED NOT DETECTED Final   Candida krusei NOT DETECTED NOT DETECTED Final   Candida parapsilosis NOT DETECTED NOT DETECTED Final   Candida tropicalis NOT DETECTED NOT DETECTED Final    Comment: Performed at [facility], [place]. [address], [place], [place] [address]  Culture, blood (routine x 2)     Status: Abnormal   Collection Time: 04/28/20  4:13 PM   Specimen: BLOOD  Result Value Ref Range Status  Specimen Description   Final    BLOOD RIGHT ANTECUBITAL Performed at [facility] [address], [place], [place] [address]    Special Requests   Final     BOTTLES DRAWN AEROBIC AND ANAEROBIC Blood Culture adequate volume Performed at [facility] [address], [place], [place] [address]    Culture  Setup Time   Final    GRAM POSITIVE COCCI IN BOTH AEROBIC AND ANAEROBIC BOTTLES CRITICAL VALUE NOTED.  VALUE IS CONSISTENT WITH PREVIOUSLY REPORTED AND CALLED VALUE.    Culture (A)  Final    STAPHYLOCOCCUS AUREUS METHICILLIN RESISTANT ORGANISM WAS NOT RECOVERED IN CULTURE. REPEATED FOR CONFIRMATION Performed at [facility], [place] [address] [place], [place] [address]    Report Status 05/03/2020 FINAL  Final   Organism ID, Bacteria STAPHYLOCOCCUS AUREUS  Final      Susceptibility   Staphylococcus aureus - MIC*    CIPROFLOXACIN <=0.5 SENSITIVE Sensitive     ERYTHROMYCIN >=8 RESISTANT Resistant     GENTAMICIN <=0.5 SENSITIVE Sensitive     OXACILLIN 0.5 SENSITIVE Sensitive     TETRACYCLINE <=1 SENSITIVE Sensitive     VANCOMYCIN <=0.5 SENSITIVE Sensitive     TRIMETH/SULFA <=10 SENSITIVE Sensitive     CLINDAMYCIN <=0.25 SENSITIVE Sensitive     RIFAMPIN <=0.5 SENSITIVE Sensitive     Inducible Clindamycin NEGATIVE Sensitive     * STAPHYLOCOCCUS AUREUS  Urine culture     Status: Abnormal   Collection Time: 04/28/20  7:00 PM   Specimen: In/Out Cath Urine  Result Value Ref Range Status   Specimen Description   Final    IN/OUT CATH URINE Performed at [facility] [address] [place], [place] [address]    Special Requests   Final    NONE Performed at [facility], [place] [address], [place],  [address]    Culture >=100,000 COLONIES/mL STAPHYLOCOCCUS AUREUS (A)  Final   Report Status 05/01/2020 FINAL  Final   Organism ID, Bacteria STAPHYLOCOCCUS AUREUS (A)  Final      Susceptibility   Staphylococcus aureus - MIC*    CIPROFLOXACIN <=0.5 SENSITIVE Sensitive     GENTAMICIN <=0.5 SENSITIVE Sensitive     NITROFURANTOIN <=16 SENSITIVE Sensitive     OXACILLIN 0.5 SENSITIVE  Sensitive     TETRACYCLINE <=1 SENSITIVE Sensitive     VANCOMYCIN <=0.5 SENSITIVE Sensitive     TRIMETH/SULFA <=10 SENSITIVE Sensitive     CLINDAMYCIN <=0.25 SENSITIVE Sensitive     RIFAMPIN <=0.5 SENSITIVE Sensitive     Inducible Clindamycin NEGATIVE Sensitive     * >=100,000 COLONIES/mL STAPHYLOCOCCUS AUREUS  SARS Coronavirus 2 by RT PCR (hospital order, performed in [place] hospital lab) Nasopharyngeal Nasopharyngeal Swab     Status: None   Collection Time: 04/28/20  7:03 PM   Specimen: Nasopharyngeal Swab  Result Value Ref Range Status   SARS Coronavirus 2 NEGATIVE NEGATIVE Final    Comment: (NOTE) SARS-CoV-2 target nucleic acids are NOT DETECTED.  The SARS-CoV-2 RNA is generally detectable in upper and lower respiratory specimens during the acute phase of infection. The lowest concentration of SARS-CoV-2 viral copies this assay can detect is 250 copies / mL. A negative result does not preclude SARS-CoV-2 infection and should not be used as the sole basis for treatment or other patient management decisions.  A negative result may occur with improper specimen collection / handling, submission of specimen other than nasopharyngeal swab, presence of viral mutation(s)  within the areas targeted by this assay, and inadequate number of viral copies (<250 copies / mL). A negative result must be combined with clinical observations, patient history, and epidemiological information.  Fact Sheet for Patients:   StrictlyIdeas.no  Fact Sheet for Healthcare Providers: BankingDealers.co.za  This test is not yet approved or  cleared by the [place] FDA and has been authorized for detection and/or diagnosis of SARS-CoV-2 by FDA under an Emergency Use Authorization (EUA).  This EUA will remain in effect (meaning this test can be used) for the duration of the COVID-19 declaration under Section 564(b)(1) of the Act, 21 U.S.C. section  360bbb-3(b)(1), unless the authorization is terminated or revoked sooner.  Performed at [facility], [place] [address] [place], [place] [address]   MRSA PCR Screening     Status: Abnormal   Collection Time: 04/28/20  7:45 PM   Specimen: Nasal Mucosa; Nasopharyngeal  Result Value Ref Range Status   MRSA by PCR POSITIVE (A) NEGATIVE Final    Comment:        The GeneXpert MRSA Assay (FDA approved for NASAL specimens only), is one component of a comprehensive MRSA colonization surveillance program. It is not intended to diagnose MRSA infection nor to guide or monitor treatment for MRSA infections. RESULT CALLED TO, READ BACK BY AND VERIFIED WITH: S SMITH AT 2314 ON 04/28/2020 BY MOSLEY,J  Performed at [facility], [place] [address], [place], [place] [address]   Culture, blood (Routine X 2) w Reflex to ID Panel     Status: None   Collection Time: 04/30/20  8:15 AM   Specimen: BLOOD  Result Value Ref Range Status   Specimen Description   Final    BLOOD LEFT ANTECUBITAL Performed at [facility], [place] [address], [place], [place] [address]    Special Requests   Final    BOTTLES DRAWN AEROBIC AND ANAEROBIC Blood Culture adequate volume Performed at [facility] [address] [place], [place] [address]    Culture   Final    NO GROWTH 5 DAYS Performed at [facility], [place] [address] [place], [place] [address]    Report Status 05/05/2020 FINAL  Final  Culture, blood (Routine X 2) w Reflex to ID Panel     Status: None   Collection Time: 04/30/20  8:15 AM   Specimen: BLOOD LEFT FOREARM  Result Value Ref Range Status   Specimen Description   Final    BLOOD LEFT FOREARM Performed at [facility] [address] [place], [place] [address]    Special Requests   Final    BOTTLES DRAWN AEROBIC ONLY Blood Culture adequate volume Performed at [facility] [address],  [place], [place] [address]    Culture   Final    NO GROWTH 5 DAYS Performed at [facility], [place] [address] [place], [place] [address]    Report Status 05/05/2020 FINAL  Final  Fungus Culture With Stain     Status: None (Preliminary result)   Collection Time: 05/03/20  9:35 AM   Specimen: Soft Tissue, Other  Result Value Ref Range Status   Fungus Stain Final report  Final    Comment: (NOTE) Performed At: [facility] [address] [place], [place] [id number] Rush Farmer MD OF:[id number]    Fungus (Mycology) Culture PENDING  Incomplete   Fungal Source TISSUE  Final    Comment: RT WRIST Performed at [facility]  [place] [address] [place], [place] [address]   Aerobic/Anaerobic Culture (surgical/deep wound)     Status: None   Collection Time: 05/03/20  9:35 AM   Specimen: Soft Tissue, Other  Result Value Ref Range Status   Specimen Description   Final    TISSUE RT WRIST Performed at [facility] [address] [place], [place] [address]    Special Requests   Final    PODIATRY Derrek Monaco Performed at [facility] [address] [place], [place] [address]    Gram Stain   Final    RARE WBC PRESENT,BOTH PMN AND MONONUCLEAR RARE GRAM POSITIVE COCCI IN PAIRS IN CLUSTERS    Culture   Final    RARE STAPHYLOCOCCUS AUREUS NO ANAEROBES ISOLATED Performed at [facility], [address], [place], [place] [address]    Report Status 05/08/2020 FINAL  Final   Organism ID, Bacteria STAPHYLOCOCCUS AUREUS  Final      Susceptibility   Staphylococcus aureus - MIC*    CIPROFLOXACIN <=0.5 SENSITIVE Sensitive     ERYTHROMYCIN >=8 RESISTANT Resistant     GENTAMICIN <=0.5 SENSITIVE Sensitive     OXACILLIN 0.5 SENSITIVE Sensitive     TETRACYCLINE <=1 SENSITIVE Sensitive     VANCOMYCIN <=0.5 SENSITIVE Sensitive     TRIMETH/SULFA <=10 SENSITIVE Sensitive     CLINDAMYCIN <=0.25 SENSITIVE Sensitive     RIFAMPIN <=0.5  SENSITIVE Sensitive     Inducible Clindamycin NEGATIVE Sensitive     * RARE STAPHYLOCOCCUS AUREUS  Fungus Culture Result     Status: None   Collection Time: 05/03/20  9:35 AM  Result Value Ref Range Status   Result 1 Comment  Final    Comment: (NOTE) KOH/Calcofluor preparation:  no fungus observed. Performed At: [facility] [place], [place] [id number] Rush Farmer MD FG:[id number]   Fungus Culture With Stain     Status: None (Preliminary result)   Collection Time: 05/03/20  9:38 AM   Specimen: Soft Tissue, Other  Result Value Ref Range Status   Fungus Stain Final report  Final    Comment: (NOTE) Performed At: [facility] [address] [place], [place] [id number] Rush Farmer MD PN:[id number]    Fungus (Mycology) Culture PENDING  Incomplete   Fungal Source TISSUE  Final    Comment: LT HAND Performed at [facility], [place] [address] [place], [place] [address]   Aerobic/Anaerobic Culture (surgical/deep wound)     Status: None   Collection Time: 05/03/20  9:38 AM   Specimen: Soft Tissue, Other  Result Value Ref Range Status   Specimen Description   Final    TISSUE LT HAND Performed at [facility] [address] [place], [place] [address]    Special Requests   Final    PATIENT ON FOLLOWING ANCEF, [facility] Performed at [facility] [address] [place], [place] [address]    Gram Stain   Final    NO WBC SEEN RARE GRAM POSITIVE COCCI IN PAIRS IN CLUSTERS    Culture   Final    FEW STAPHYLOCOCCUS AUREUS NO ANAEROBES ISOLATED Performed at [facility], [place] [address] [place],  [address]    Report Status 05/08/2020 FINAL  Final   Organism ID, Bacteria STAPHYLOCOCCUS AUREUS  Final      Susceptibility   Staphylococcus aureus - MIC*    CIPROFLOXACIN <=0.5 SENSITIVE Sensitive  ERYTHROMYCIN >=8 RESISTANT Resistant     GENTAMICIN <=0.5 SENSITIVE Sensitive      OXACILLIN 0.5 SENSITIVE Sensitive     TETRACYCLINE <=1 SENSITIVE Sensitive     VANCOMYCIN 1 SENSITIVE Sensitive     TRIMETH/SULFA <=10 SENSITIVE Sensitive     CLINDAMYCIN <=0.25 SENSITIVE Sensitive     RIFAMPIN <=0.5 SENSITIVE Sensitive     Inducible Clindamycin NEGATIVE Sensitive     * FEW STAPHYLOCOCCUS AUREUS  Fungus Culture Result     Status: None   Collection Time: 05/03/20  9:38 AM  Result Value Ref Range Status   Result 1 Comment  Final    Comment: (NOTE) KOH/Calcofluor preparation:  no fungus observed. Performed At: [facility] [address] [place], [place] [id number] Rush Farmer MD NW:[id number]   Culture, blood (Routine X 2) w Reflex to ID Panel     Status: None   Collection Time: 05/04/20  5:19 PM   Specimen: BLOOD  Result Value Ref Range Status   Specimen Description   Final    BLOOD RIGHT ANTECUBITAL Performed at [facility] [address], [place], [place] [address]    Special Requests   Final    BOTTLES DRAWN AEROBIC AND ANAEROBIC Blood Culture adequate volume Performed at [facility] [address] [place], [place] [address]    Culture   Final    NO GROWTH 5 DAYS Performed at [facility], [place] [address], [place], [place] [address]    Report Status 05/09/2020 FINAL  Final  Culture, blood (Routine X 2) w Reflex to ID Panel     Status: None   Collection Time: 05/04/20  5:19 PM   Specimen: BLOOD  Result Value Ref Range Status   Specimen Description   Final    BLOOD LEFT ANTECUBITAL Performed at [facility] [address], [place], [place] [address]    Special Requests   Final    BOTTLES DRAWN AEROBIC ONLY Blood Culture adequate volume Performed at [facility] [address], [place], [place] [address]    Culture   Final    NO GROWTH 5 DAYS Performed at [facility], [place] [address] [place], [place] [address]    Report Status 05/09/2020 FINAL   Final  Fungus Culture With Stain     Status: None (Preliminary result)   Collection Time: 05/10/20  9:10 AM   Specimen: Soft Tissue, Other  Result Value Ref Range Status   Fungus Stain Final report  Final    Comment: (NOTE) Performed At: [facility] [place], [place] [id number] Rush Farmer MD [id number]    Fungus (Mycology) Culture PENDING  Incomplete   Fungal Source TRICUSPID VALVE VEGETATION  Final    Comment: Performed at [facility], [place] [address] [place], [place] [address]  Aerobic/Anaerobic Culture (surgical/deep wound)     Status: None   Collection Time: 05/10/20  9:10 AM   Specimen: Soft Tissue, Other  Result Value Ref Range Status   Specimen Description TISSUE  Final   Special Requests TRICUSPID VALVE VEGETATION  Final   Gram Stain   Final    RARE WBC PRESENT, PREDOMINANTLY PMN ABUNDANT GRAM POSITIVE COCCI IN PAIRS IN CLUSTERS    Culture   Final    ABUNDANT METHICILLIN RESISTANT STAPHYLOCOCCUS AUREUS NO ANAEROBES ISOLATED Performed at [facility], [place] [address], [place],  [address]    Report Status 05/15/2020 FINAL  Final   Organism ID, Bacteria METHICILLIN RESISTANT STAPHYLOCOCCUS AUREUS  Final      Susceptibility   Methicillin resistant staphylococcus aureus - MIC*    CIPROFLOXACIN <=0.5 SENSITIVE Sensitive     ERYTHROMYCIN >=8 RESISTANT Resistant     GENTAMICIN <=0.5 SENSITIVE Sensitive     OXACILLIN >=4 RESISTANT Resistant     TETRACYCLINE <=1 SENSITIVE Sensitive     VANCOMYCIN 1 SENSITIVE Sensitive     TRIMETH/SULFA <=10 SENSITIVE Sensitive     CLINDAMYCIN <=0.25 SENSITIVE Sensitive     RIFAMPIN <=0.5 SENSITIVE Sensitive     Inducible Clindamycin NEGATIVE Sensitive     * ABUNDANT METHICILLIN RESISTANT STAPHYLOCOCCUS AUREUS  Acid Fast Smear (AFB)     Status: None   Collection Time: 05/10/20  9:10 AM   Specimen: Soft Tissue, Other  Result Value Ref Range Status   AFB Specimen Processing Concentration   Final   Acid Fast Smear Negative  Final    Comment: (NOTE) Performed At: [facility] [address] [place], [place] [id number] Rush Farmer MD NI:[id number]    Source (AFB) TRICUSPID VALVE VEGETATION  Final    Comment: Performed at [facility], [place] [address], [place], [place] [address]  Fungus Culture Result     Status: None   Collection Time: 05/10/20  9:10 AM  Result Value Ref Range Status   Result 1 Comment  Final    Comment: (NOTE) KOH/Calcofluor preparation:  no fungus observed. Performed At: [facility] [place], [place] [id number] Rush Farmer MD PJ:[id number]   Culture, respiratory (non-expectorated)     Status: None   Collection Time: 05/13/20  6:05 PM   Specimen: Tracheal Aspirate; Respiratory  Result Value Ref Range Status   Specimen Description TRACHEAL ASPIRATE  Final   Special Requests NONE  Final   Gram Stain   Final    NO WBC SEEN FEW GRAM POSITIVE COCCI Performed at [facility], [place] [address] [place], [place] [address]    Culture FEW METHICILLIN RESISTANT STAPHYLOCOCCUS AUREUS  Final   Report Status 05/15/2020 FINAL  Final   Organism ID, Bacteria METHICILLIN RESISTANT STAPHYLOCOCCUS AUREUS  Final      Susceptibility   Methicillin resistant staphylococcus aureus - MIC*    CIPROFLOXACIN <=0.5 SENSITIVE Sensitive     ERYTHROMYCIN >=8 RESISTANT Resistant     GENTAMICIN <=0.5 SENSITIVE Sensitive     OXACILLIN >=4 RESISTANT Resistant     TETRACYCLINE <=1 SENSITIVE Sensitive     VANCOMYCIN <=0.5 SENSITIVE Sensitive     TRIMETH/SULFA <=10 SENSITIVE Sensitive     CLINDAMYCIN <=0.25 SENSITIVE Sensitive     RIFAMPIN <=0.5 SENSITIVE Sensitive     Inducible Clindamycin NEGATIVE Sensitive     * FEW METHICILLIN RESISTANT STAPHYLOCOCCUS AUREUS  Culture, blood (routine x 2)     Status: Abnormal   Collection Time: 05/13/20  6:21 PM   Specimen: BLOOD  Result Value Ref Range Status   Specimen Description BLOOD LEFT  ANTECUBITAL  Final   Special Requests   Final    BOTTLES DRAWN AEROBIC AND ANAEROBIC Blood Culture adequate volume   Culture  Setup Time   Final    GRAM POSITIVE COCCI IN BOTH AEROBIC AND ANAEROBIC BOTTLES CRITICAL VALUE NOTED.  VALUE IS CONSISTENT WITH PREVIOUSLY REPORTED AND CALLED VALUE.    Culture (A)  Final    STAPHYLOCOCCUS AUREUS SUSCEPTIBILITIES PERFORMED ON PREVIOUS CULTURE WITHIN THE LAST 5 DAYS. Performed at [facility], [place]  [address], [place], [place] [address]    Report Status 05/17/2020 FINAL  Final  Culture, blood (routine x 2)     Status: Abnormal   Collection Time: 05/13/20  6:30 PM   Specimen: BLOOD LEFT FOREARM  Result Value Ref Range Status   Specimen Description BLOOD LEFT FOREARM  Final   Special Requests   Final    BOTTLES DRAWN AEROBIC AND ANAEROBIC Blood Culture adequate volume   Culture  Setup Time   Final    GRAM POSITIVE COCCI IN CLUSTERS ANAEROBIC BOTTLE ONLY CRITICAL RESULT CALLED TO, READ BACK BY AND VERIFIED WITH: Ellin Mayhew A[place] [id number] AT 1441 BY CM Performed at [facility], [place] [address], [place], [place] [address]    Culture METHICILLIN RESISTANT STAPHYLOCOCCUS AUREUS (A)  Final   Report Status 05/17/2020 FINAL  Final   Organism ID, Bacteria METHICILLIN RESISTANT STAPHYLOCOCCUS AUREUS  Final      Susceptibility   Methicillin resistant staphylococcus aureus - MIC*    CIPROFLOXACIN <=0.5 SENSITIVE Sensitive     ERYTHROMYCIN >=8 RESISTANT Resistant     GENTAMICIN <=0.5 SENSITIVE Sensitive     OXACILLIN >=4 RESISTANT Resistant     TETRACYCLINE <=1 SENSITIVE Sensitive     VANCOMYCIN 2 SENSITIVE Sensitive     TRIMETH/SULFA <=10 SENSITIVE Sensitive     CLINDAMYCIN <=0.25 SENSITIVE Sensitive     RIFAMPIN <=0.5 SENSITIVE Sensitive     Inducible Clindamycin NEGATIVE Sensitive     * METHICILLIN RESISTANT STAPHYLOCOCCUS AUREUS  MRSA PCR Screening     Status: Abnormal   Collection Time: 05/13/20  7:45 PM   Specimen: Nasal Mucosa;  Nasopharyngeal  Result Value Ref Range Status   MRSA by PCR POSITIVE (A) NEGATIVE Final    Comment:        The GeneXpert MRSA Assay (FDA approved for NASAL specimens only), is one component of a comprehensive MRSA colonization surveillance program. It is not intended to diagnose MRSA infection nor to guide or monitor treatment for MRSA infections. RESULT CALLED TO, READ BACK BY AND VERIFIED WITH: Bailey Mech RN 05/13/20 2117 JDW Performed at [facility] [address] [place],  [address]     Studies/Results: [place] CHEST PORT 1 VIEW  Result Date: 05/17/2020 CLINICAL DATA:  Hypoxia EXAM: PORTABLE CHEST 1 VIEW COMPARISON:  May 14, 2020 FINDINGS: Endotracheal tube tip is 4.2 cm above the carina. Central catheter tip is in the superior vena cava. Nasogastric tube tip and side port are below the diaphragm. Right central catheter no longer evident. No pneumothorax. There is patchy airspace opacity throughout much of the right lung, essentially stable. Scattered areas of atelectatic change noted in the left mid lung and left base. Heart is upper normal in size with pulmonary vascularity normal. No evident adenopathy. No bone lesions. IMPRESSION: Tube and catheter positions as described without pneumothorax. Suspect multifocal pneumonia on the right, essentially stable. Areas of scattered atelectatic change on the left. Stable cardiac silhouette. Electronically Signed   By: Lowella Grip III M.D.   On: 05/17/2020 07:14      Assessment/Plan:  INTERVAL HISTORY:    UNFORTUNATELY BLOOD CULTURES ARE POSITIVE AGAIN   Principal Problem:   Endocarditis of tricuspid valve Active Problems:   Intravenous drug abuse ([place])   Septic embolism (HCC)   Sepsis (HCC)   Thrombocytopenia (HCC)   Hypokalemia   Hyponatremia   MRSA bacteremia   Malnutrition of moderate degree   Threatening behavior   Hand abscess   Elevated BUN  Pressure injury of skin   Chest pain   Acute respiratory  failure ([place])    Clifford Clark is a 29 y.o. male with IV drug use history MSSA and now MRSA bacteremia with tricuspid valve endocarditis, severe TR with R heart failure,  septic emboli status post angio VAC with debridement of atrial mass and tricuspid valve vegetation.  1.  MSSA and MRSA bacteremia and tricuspid valve endocarditis status post angio VAC:  MRSA has recurred and undoubtedly seeded his lines.  Note we were not covering the MRSA initially identified on bio fire until recently when he already had central lines.  He still has a large vegetation on the tricuspid valve despite angio VAC.  He has a pericardial effusion which I have reviewed with Dr. Haroldine Laws.  Dr. Kae Heller does not feel this looks like a purulent pericardial effusion but more of a transudative process.    He will line central line holiday when possible   He is going to need a protracted course of therapy directed at [facility] but also the MRSA  #2 cavitary pneumonia this is all MSSA and MRSA pathology.  3.  Back pain: MRI did not show evidence of discitis in the thoracic or lumbar area but could have infection there and just not be showing up on MRI yet.  4.  For IV drug use: He will need plan for addressing this root cause   LOS: 19 days   Alcide Evener 05/17/2020, 11:18 AM

## 2020-05-17 NOTE — Progress Notes (Addendum)
Advanced Heart Failure Rounding Note  PCP-Cardiologist: No primary care provider on file.    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 ER w/u showed multiple septic emboli to lungs with cavitary lesions. 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  Also found to have emboli to kidneys and hands. TEE with EF 60-65% small PFO. large TV vegetation with severe TR. No vegetations on Aov/MV despite left-sided emboli.  Underwent AngioJet removal of part of vegetation. Then developed MRSA sepsis and respiratory failure. Intubated and started on pressors. F/u echo LVEF 20-25%. RV normal.  hstrop 192 -> 190    Subjective:    Remains intubated and febrile. mTemp 101.5. WBC trending down 16.9>>11.9>>10.5.   Volume overloaded w/ 2+ bilateral LEE. CVP 13.  SCr 0.58. K 3.6.    Co-ox pending   Hgb trending down, 8.0>>7.5>>7.2  Today is his 29th Birthday.    Objective:   Weight Range: 67 kg Body mass index is 23.13 kg/m.   Vital Signs:   Temp:  [98.8 F (37.1 C)-101.5 F (38.6 C)] 99.5 F (37.5 C) (07/27 0600) Pulse Rate:  [92-123] 93 (07/27 0800) Resp:  [19-32] 24 (07/27 0800) BP: (93-124)/(60-87) 107/68 (07/27 0800) SpO2:  [97 %-100 %] 100 % (07/27 0800) Arterial Line BP: (97-147)/(62-89) 97/62 (07/26 1600) FiO2 (%):  [40 %] 40 % (07/27 0400) Weight:  [67 kg] 67 kg (07/27 0600) Last BM Date: 05/07/20  Weight change: Filed Weights   05/15/20 0530 05/16/20 0446 05/17/20 0600  Weight: 65.3 kg 66.1 kg 67 kg    Intake/Output:   Intake/Output Summary (Last 24 hours) at 05/17/2020 0815 Last data filed at 05/17/2020 0700 Gross per 24 hour  Intake 2598.04 ml  Output 1675 ml  Net 923.04 ml      Physical Exam    CVP 13  General:  Young WM, intubated.   HEENT: Normal + ETT Neck: Supple. JVP elevated to jawline. Carotids 2+ bilat; no bruits. No lymphadenopathy or thyromegaly appreciated. + left IJ CVC  Cor: PMI nondisplaced.  Regular rate & rhythm. Course lung sounds masks heart sounds Lungs: intubated, course BS bilaterally  Abdomen: Soft, nontender, nondistended. No hepatosplenomegaly. No bruits or masses. Good bowel sounds. Extremities: No cyanosis, clubbing, rash, 2+ bilateral LE edema up to thighs  Neuro: intubated  Telemetry   NSR/ sinus tach low 100s   EKG   No new EKG to review today.   Labs    CBC Recent Labs    05/16/20 0405 05/17/20 0425  WBC 11.9* 10.5  HGB 7.5* 7.2*  HCT 24.0* 23.5*  MCV 90.2 92.5  PLT 133* 563*   Basic Metabolic Panel Recent Labs    05/15/20 0414 05/15/20 0414 05/15/20 1714 05/16/20 0405 05/17/20 0425  NA 139   < >  --  140 142  K 3.6   < >  --  4.0 3.6  CL 106   < >  --  107 108  CO2 24   < >  --  27 26  GLUCOSE 136*   < >  --  110* 115*  BUN 21*   < >  --  20 15  CREATININE 0.59*   < >  --  0.55* 0.58*  CALCIUM 7.3*   < >  --  7.7* 7.9*  MG 2.1  --  2.1  --   --   PHOS 3.0  --  2.3*  --   --    < > =  values in this interval not displayed.   Liver Function Tests Recent Labs    05/16/20 0405 05/17/20 0425  AST 24 21  ALT 25 24  ALKPHOS 144* 150*  BILITOT 0.8 0.9  PROT 5.9* 5.7*  ALBUMIN 1.5* 1.5*   No results for input(s): LIPASE, AMYLASE in the last 72 hours. Cardiac Enzymes No results for input(s): CKTOTAL, CKMB, CKMBINDEX, TROPONINI in the last 72 hours.  BNP: BNP (last 3 results) No results for input(s): BNP in the last 8760 hours.  ProBNP (last 3 results) No results for input(s): PROBNP in the last 8760 hours.   D-Dimer No results for input(s): DDIMER in the last 72 hours. Hemoglobin A1C No results for input(s): HGBA1C in the last 72 hours. Fasting Lipid Panel Recent Labs    05/17/20 0425  TRIG 171*   Thyroid Function Tests No results for input(s): TSH, T4TOTAL, T3FREE, THYROIDAB in the last 72 hours.  Invalid input(s): FREET3  Other results:   Imaging    DG CHEST PORT 1 VIEW  Result Date: 05/17/2020 CLINICAL  DATA:  Hypoxia EXAM: PORTABLE CHEST 1 VIEW COMPARISON:  May 14, 2020 FINDINGS: Endotracheal tube tip is 4.2 cm above the carina. Central catheter tip is in the superior vena cava. Nasogastric tube tip and side port are below the diaphragm. Right central catheter no longer evident. No pneumothorax. There is patchy airspace opacity throughout much of the right lung, essentially stable. Scattered areas of atelectatic change noted in the left mid lung and left base. Heart is upper normal in size with pulmonary vascularity normal. No evident adenopathy. No bone lesions. IMPRESSION: Tube and catheter positions as described without pneumothorax. Suspect multifocal pneumonia on the right, essentially stable. Areas of scattered atelectatic change on the left. Stable cardiac silhouette. Electronically Signed   By: Lowella Grip III M.D.   On: 05/17/2020 07:14      Medications:     Scheduled Medications: . chlorhexidine gluconate (MEDLINE KIT)  15 mL Mouth Rinse BID  . Chlorhexidine Gluconate Cloth  6 each Topical Daily  . docusate  100 mg Per Tube BID  . enoxaparin (LOVENOX) injection  40 mg Subcutaneous Q24H  . feeding supplement (PROSource TF)  45 mL Per Tube BID  . mouth rinse  15 mL Mouth Rinse 10 times per day  . mupirocin ointment  1 application Nasal BID  . pantoprazole sodium  20 mg Per Tube Daily  . polyethylene glycol  17 g Per Tube Daily  . sodium chloride flush  10-40 mL Intracatheter Q12H  . sodium chloride flush  3 mL Intravenous Q12H     Infusions: . sodium chloride    . feeding supplement (VITAL AF 1.2 CAL) 1,000 mL (05/16/20 1606)  . fentaNYL infusion INTRAVENOUS 150 mcg/hr (05/17/20 0700)  . propofol (DIPRIVAN) infusion 25 mcg/kg/min (05/17/20 0700)  . vancomycin 750 mg (05/17/20 0747)     PRN Medications:  acetaminophen (TYLENOL) oral liquid 160 mg/5 mL, acetaminophen **OR** acetaminophen, fentaNYL, ipratropium-albuterol, LORazepam, metoprolol tartrate, ondansetron  **OR** ondansetron (ZOFRAN) IV, ondansetron, sodium chloride flush   Assessment/Plan   1. 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 There was also amall flickering leaflet mass, suggestive of vegetation of the pulmonic valve. No mitral or aortic valve pathology. - s/p Angiovac debulking on 7/20  - abx per ID, currently on  vancomycin  - remains febrile. mTemp 101.5. WBCs down trending 17>12>11.  - Will eventually need TV repair if he is a candidate  2. Acute Hypoxic Respiratory Failure - vent management per PCCM   3. Septic Shock  - 2/2 MRSA Bacteremia - improving, now off pressors. MAPs 70s-80s  - remains febrile but WBCs down trending - continue abx per ID  4. Acute Systolic Heart Failure - initial Echo 7/9 w/ normal LVEF 50-55%. RV normal - TEE 7/16, LVEF normal 60-65%, RV normal - Limited Echo 7/24, LVEF severely reduced 31-54%, RV systolic function hyperdynamic. MV w/ mild-mod MR but no seen vegetation  - Hs trop 192 -> 190. Trend flat, most c/w demand ischemia and not ACS - suspect acute drop in EF stress induced from septic shock. Doubt CAD - CO/CI ok by FloTrac analysis (now discontinued). Co-ox pending   - septic shock improving. Now off NE. MAPs stable - He is volume overloaded on exam w/ 2+ bilateral LEE. CVP 13.  - Give IV Lasix 40 mg bid + supp K  - BP too soft for initiation of GDMT currently  - continue supportive care/ management of MRSA bacteremia/ Endocarditis  - we will continue to follow along  - continue CVPs to monitor fluid status   5. Anemia - Hgb 7.2  - transfuse for hgb <7.0     Length of Stay: [address] PA-C  05/17/2020, 8:15 AM  Advanced Heart Failure Team Pager [phone] (M-F; 7a - 4p)  Please contact [facility] for night-coverage after hours (4p -7a ) and weekends on  [email or web address]  Agree with above. Remains on vent. Awake and following commands. Now off pressors. MAPs ok. CVP 13-14.  Tm 101.5 WBC falling.   General:  On vent awake and following commands HEENT: normal Neck: supple.JVP to jaw. Carotids 2+ bilat; no bruits. No lymphadenopathy or thryomegaly appreciated. Cor: PMI nondisplaced. Regular rate & rhythm. 2/6 TR Lungs: clear Abdomen: soft, nontender, nondistended. No hepatosplenomegaly. No bruits or masses. Good bowel sounds. Extremities: no cyanosis, clubbing, rash, 2+ edema + Osler's  Neuro: On vent awake and following commands. Nonfocal   Septic shock improving. Off pressors. Still febrile. WBC falling. CVP up. Agree with IV lasix. Suspect EF will recover soon will repeat limited echo later this week. Will add spiro.   Today is his 77th birthday.  CRITICAL CARE Performed by: Glori Bickers  Total critical care time: 35 minutes  Critical care time was exclusive of separately billable procedures and treating other patients.  Critical care was necessary to treat or prevent imminent or life-threatening deterioration.  Critical care was time spent personally by me (independent of midlevel providers or residents) on the following activities: development of treatment plan with patient and/or surrogate as well as nursing, discussions with consultants, evaluation of patient's response to treatment, examination of patient, obtaining history from patient or surrogate, ordering and performing treatments and interventions, ordering and review of laboratory studies, ordering and review of radiographic studies, pulse oximetry and re-evaluation of patient's condition.  Glori Bickers, MD  10:20 AM

## 2020-05-17 NOTE — Progress Notes (Addendum)
NAME:  Clifford Clark, MRN:  [id number], DOB:  25-Mar-1991, LOS: 19 ADMISSION DATE:  04/28/2020, CONSULTATION DATE:  05/13/2020 REFERRING MD:  Albertine Grates CHIEF COMPLAINT:  Endocarditis  Brief History   29 yo male with PMH significant for IVDA (fentanyl), asthma admitted with tricuspid valve endocarditis, MRSA bacteremia.   History of present illness   Patient presented to ED on 7/08 after being found on the ground by a friend.  His work up revealed Tricuspid valve endocarditis with MRSA bacteremia. He was also found to have septic emboli to kidneys and lungs. CT of hands showed bilateral abscesses and underwent I&D on 7/13. 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 Patient was found to have increasing tachycardia and tachypnea today.     Patient is negative 3.9 L to date and + 990 last 24 hours. T max 103. A CTA Chest was completed, post imaging, patient returned to room and found to have respiratory rate in 50's.  CCM consulted and patient transferred to ICU for intubation and mechanical ventilation. Post intubation, patient did require levo during intubation.    Past Medical History  IVDA --Fentanyl Asthma  Significant Hospital Events   7/08: Admitted 7/13: I&D of bilateral and abscesses 7/23: transferred to ICU due to respiratory distress. Intubated.  7/24 ECHO reveals worsening EF  7/26: off pressors, Weaning sedation, following commands   Consults:  ID Orthopedics CVTS  Psych PCCM  Procedures:  7/13: I&D of bilateral and abscesses 7/20: Debridement of right atrial mass and debridement of tricuspid valve vegetation 7/22: PICC line placed 7/23: CVC 7/23: ETT   Significant Diagnostic Tests:  7/8: Abdominal U/S: Hepatomegaly 7/16: TEE: LVEF 60-65%, RV normal, left atrial thrombus, small pericardial effusion, MVR. Tricuspid valve vegetation.  Pulmonic valve vegetation. 7/21: [place] of LUE without  evidence of superficial or deep vein thrombosis  7/23: CTA chest without PE, multifocal PNA and septic emboli -worsening compared to previous studies. 7/24: echo shows worsening cardiomyopathy. LVEF 20-25% LV global hypokinesis. Hyperdynamic RV. Small circumferential pericardial effusion. Abnormal mitral valve with mild-moderate MVR. Mobile filamentous mass on tricuspid valve 0.5x1.75cm-- c/w vegetation. Severe TVR.   Micro Data:  7/8 Blood Cx: MRSA Bacteremia 7/13: Blood Cx: MRSA Bacteremia 7/13: Fungal Cx: NGTD 7/20: Blood Cx:  MRSA Bacteremia 7/23 Blood Cx: >> GPC 7/23 respiratory Cx >> GPC>> MRSA  Antimicrobials:  Vanc >>  [facility] 7/23  Interim history/subjective:  Remains off pressors T max 101.5 overnight, intermittently required cooling blanket  WBC down trending to 10.5  Weaning sedation slowly Copious secretions HGB is 7.2  Objective   Blood pressure 107/68, pulse 93, temperature 99.1 F (37.3 C), temperature source Rectal, resp. rate 15, height 5\' 7"  (1.702 m), weight 67 kg, SpO2 100 %. CVP:  [6 mmHg-14 mmHg] 11 mmHg  Vent Mode: CPAP;PSV FiO2 (%):  [40 %] 40 % Set Rate:  [20 bmp-24 bmp] 24 bmp Vt Set:  [390 mL] 390 mL PEEP:  [5 cmH20] 5 cmH20 Pressure Support:  [5 cmH20] 5 cmH20 Plateau Pressure:  [12 cmH20-17 cmH20] 12 cmH20   Intake/Output Summary (Last 24 hours) at 05/17/2020 0922 Last data filed at 05/17/2020 0800 Gross per 24 hour  Intake 2651.48 ml  Output 1675 ml  Net 976.48 ml   Filed Weights   05/15/20 0530 05/16/20 0446 05/17/20 0600  Weight: 65.3 kg 66.1 kg 67 kg     11.1    CO  CI              6.8    CI    SV              91    SV    SVR              538    SVR    SVRI              747    SVRI     Examination: General: Critically ill appearing young adult M, intubated, sedated, NAD, weaning on CPAP HENT: ETT secure OGT secure. Anicteric sclera. Copious  Tan secretions.  Lungs: Scattered rhonchi, bilateral chest excursion ,  Mechanically ventilated, on CPAP PS 5/5  Cardiovascular: RRR s1s2. Cap refill < 3 seconds. No appreciable JVD , 2+ LE edema bilaterally Abdomen: soft flat ndnt + bowel sounds  Extremities: No obvious joint deformity. BLE 2+ edema. No cyanosis or clubbing  Neuro: Lightly sedated, awakens to voice, follows commands. PERRL3mm  Resolved Hospital Problem list   AKI Elevated LFTs  Assessment & Plan:   Acute hypoxic respiratory failure requiring intubation in setting of multifocal PNA with cavitary lesions, septic emboli  Tolerating CPAP PS of 5/5 Copious secretions may be barrier to extubation 7/27 P - qAM SBT  - MV support, VAP bundle - pulm hygiene  - Continue Vanc  - Trend CXR - ABG prn  MRSA  MSSA Bacteremia with tricuspid & pulmonic endocarditis S/p Right atrial mass s/p angiovac 7/20 WBC down trending P - ID following - Cont vanc  - Trend WBC  - Follow micro  Septic shock in setting of above MRSA MSSA bacteremia, endocarditic, PNA -- improved shock , off pressors -now c/w severe sepsis P -MAP goal > 65 -Vanc for severe sepsis  - Maintain MAP > 65 mm Hg  Anemia HGB 7.2 Plan Monitor for bleeding Transfuse for HGB  Inadequate PO intake P -Continue EN  Substance abuse disorder P -will need cessation counseling when medically appropriate   Bilateral wrist abscesses, s/p I&D 7/13 -sutures per ortho   Decreased UO 7/27 Has been I&O cathed x 2 last 24 hours Plan Bladder scan Consider short term Foley cath     Daily Goals Checklist  Pain/Anxiety/Delirium protocol (if indicated): Propofol, Fentanyl  VAP protocol (if indicated): Bundle in place Respiratory support goals: QAM SBT Blood pressure target: Titrate to keep MAP greater than 65 DVT prophylaxis: Enoxaparin Nutrition Status: EN GI prophylaxis: Pantoprazole Fluid status goals: Allow autoregulation in positive fluid balance Urinary catheter: Handheld urinal voids oer self Central lines: Right arm  single-lumen PICC, left subclavian triple-lumen. Glucose control: Euglycemic.  No history of diabetes. Mobility/therapy needs:  Antibiotic de-escalation: Continue vancomycin. Home medication reconciliation: On hold. Daily labs: CBC, BMP Code Status: Full code Family Communication: Pending 7/27 -- Updated patient at bedside  Disposition: ICU   Labs   CBC: Recent Labs  Lab 05/13/20 0839 05/13/20 0839 05/13/20 1815 05/13/20 1834 05/15/20 0414 05/16/20 0405 05/17/20 0425  WBC 17.8*  --   --  15.7* 16.9* 11.9* 10.5  NEUTROABS 14.9*  --   --   --   --   --   --   HGB 8.1*   < > 9.5* 8.5* 8.0* 7.5* 7.2*  HCT 24.5*   < > 28.0* 26.2* 24.3* 24.0* 23.5*  MCV 88.8  --   --  90.0 90.0 90.2 92.5  PLT 124*  --   --  123*  128* 133* 137*   < > = values in this interval not displayed.    Basic Metabolic Panel: Recent Labs  Lab 05/13/20 0839 05/13/20 1815 05/13/20 1834 05/14/20 1617 05/15/20 0414 05/15/20 1714 05/16/20 0405 05/17/20 0425  NA 132*   < > 131* 137 139  --  140 142  K 4.1   < > 4.1 3.6 3.6  --  4.0 3.6  CL 97*  --  98 104 106  --  107 108  CO2 27  --  24 25 24   --  27 26  GLUCOSE 117*  --  163* 120* 136*  --  110* 115*  BUN 8  --  13 19 21*  --  20 15  CREATININE 0.70  --  0.71 0.64 0.59*  --  0.55* 0.58*  CALCIUM 7.5*  --  7.4* 7.1* 7.3*  --  7.7* 7.9*  MG 1.8  --  1.9 2.0 2.1 2.1  --   --   PHOS  --   --  4.1 3.8 3.0 2.3*  --   --    < > = values in this interval not displayed.   GFR: Estimated Creatinine Clearance: 126.2 mL/min (A) (by C-G formula based on SCr of 0.58 mg/dL (L)). Recent Labs  Lab 05/13/20 1834 05/13/20 2050 05/15/20 0414 05/16/20 0405 05/17/20 0425  WBC 15.7*  --  16.9* 11.9* 10.5  LATICACIDVEN 1.5 1.2  --   --   --     Liver Function Tests: Recent Labs  Lab 05/13/20 0839 05/13/20 1834 05/15/20 0414 05/16/20 0405 05/17/20 0425  AST 37 39 20 24 21   ALT 40 41 25 25 24   ALKPHOS 110 120 98 144* 150*  BILITOT 0.6 0.8 0.5 0.8 0.9    PROT 6.2* 6.3* 5.7* 5.9* 5.7*  ALBUMIN 1.4* 1.5* 1.3* 1.5* 1.5*   No results for input(s): LIPASE, AMYLASE in the last 168 hours. No results for input(s): AMMONIA in the last 168 hours.  ABG    Component Value Date/Time   PHART 7.369 05/13/2020 1815   PCO2ART 45.9 05/13/2020 1815   PO2ART 137 (H) 05/13/2020 1815   HCO3 26.4 05/13/2020 1815   TCO2 28 05/13/2020 1815   ACIDBASEDEF 0.6 05/06/2020 2215   O2SAT 71.9 05/14/2020 1707     Coagulation Profile: Recent Labs  Lab 05/15/20 0414  INR 1.4*    Cardiac Enzymes: No results for input(s): CKTOTAL, CKMB, CKMBINDEX, TROPONINI in the last 168 hours.  HbA1C: No results found for: HGBA1C  CBG: Recent Labs  Lab 05/16/20 1247 05/16/20 1620 05/16/20 2015 05/16/20 2340 05/17/20 0428  GLUCAP 107* 115* 96 98 103*    Critical Care APP time 25 minutes  05/18/20,  MSN, AGACNP-BC [facility] Pulmonary/Critical Care Medicine See Amion for pager numbers and assignments CCM Pager [phone] 05/17/2020, 9:22 AM

## 2020-05-17 NOTE — Plan of Care (Signed)
Stable during shift. Calm, cooperative, remains unrestrained. On Diprivan and Fentanyl gtts. Mouths words and writes notes. Remains on vent, sats stable, copious secretions (oral and ETT).   BP stable off levo. Tolerated incr of new TF to goal. Still no BM. Voids without complication. Assists with turning, ADLs.  Cooling blanket remains in use, Tmax 38.6C on dayshift, 37.6 C tonight.    Problem: Education: Goal: Knowledge of General Education information will improve Description: Including pain rating scale, medication(s)/side effects and non-pharmacologic comfort measures Outcome: Progressing   Problem: Health Behavior/Discharge Planning: Goal: Ability to manage health-related needs will improve Outcome: Progressing   Problem: Clinical Measurements: Goal: Ability to maintain clinical measurements within normal limits will improve Outcome: Progressing Goal: Will remain free from infection Outcome: Progressing Goal: Diagnostic test results will improve Outcome: Progressing Goal: Respiratory complications will improve Outcome: Progressing Goal: Cardiovascular complication will be avoided Outcome: Progressing   Problem: Activity: Goal: Risk for activity intolerance will decrease Outcome: Progressing   Problem: Nutrition: Goal: Adequate nutrition will be maintained Outcome: Progressing   Problem: Coping: Goal: Level of anxiety will decrease 05/17/2020 0659 by Danella Penton, RN Outcome: Progressing 05/17/2020 0250 by Danella Penton, RN Outcome: Progressing   Problem: Elimination: Goal: Will not experience complications related to urinary retention Outcome: Progressing   Problem: Pain Managment: Goal: General experience of comfort will improve Outcome: Progressing   Problem: Safety: Goal: Ability to remain free from injury will improve Outcome: Progressing   Problem: Skin Integrity: Goal: Risk for impaired skin integrity will decrease Outcome: Progressing

## 2020-05-17 NOTE — Progress Notes (Signed)
Pharmacy Antibiotic Note  Clifford Clark is a 29 y.o. 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 Pharmacy has been consulted for vancomycin dosing. SCr remains stable today, will obtain vancomycin trough 7/30 or with changes in renal function.  Plan: Continue Vancomycin 750 IV q8hr Monitor cultures, clinical status, renal fx, f/u duration  Will check vancomycin trough 7/30   Height: 5\' 7"  (170.2 cm) Weight: 67 kg (147 lb 11.3 oz) IBW/kg (Calculated) : 66.1  Temp (24hrs), Avg:99.8 F (37.7 C), Min:98.8 F (37.1 C), Max:101.5 F (38.6 C)  Recent Labs  Lab 05/13/20 0839 05/13/20 0839 05/13/20 1834 05/13/20 2050 05/14/20 1617 05/15/20 0414 05/16/20 0405 05/17/20 0425  WBC 17.8*  --  15.7*  --   --  16.9* 11.9* 10.5  CREATININE 0.70   < > 0.71  --  0.64 0.59* 0.55* 0.58*  LATICACIDVEN  --   --  1.5 1.2  --   --   --   --   VANCOTROUGH  --   --   --   --  14*  --   --   --    < > = values in this interval not displayed.    Estimated Creatinine Clearance: 126.2 mL/min (A) (by C-G formula based on SCr of 0.58 mg/dL (L)).    No Known Allergies  Antimicrobials this admission: Cefepime 7/8 >> 7/9 Vancomycin 7/8 >> 7/15, 7/22>> Metronidazole 7/8 >> 7/9  Cefazolin 7/16>>7/22 Meropenem 7/23>>7/24  Microbiology results: 7/8 BCx: BCID resulted 4/4 MRSA, BCx resulting as MSSA 7/8 UCx:  >100,000 colonies MSSA  7/8 MRSA PCR: positive  7/8 COVID-19 negative  7/8 HIV:  negative  7/8 hepatitis panel: hep C Ab positive 7/9: HCV RNA quant: not elevated  7/10 rpt BCx: NGTD  7/13 R WristCx & FungalCx: rare MSSA 7/13 L WristCx & FungalCx: few MSSA 7/14 BCx: negative 7/20 valve tissue: MRSA  7/23: trach aspirate : few Staph aureus 7/23 BCx: MRSA 7/23 MRSA PCR: positive    8/23, PharmD, BCPS Clinical Pharmacist [phone] Please check [facility]ION for all [facility] numbers 05/17/2020

## 2020-05-18 ENCOUNTER — Inpatient Hospital Stay (HOSPITAL_COMMUNITY): Payer: Self-pay

## 2020-05-18 LAB — CBC
HCT: 24.7 % — ABNORMAL LOW (ref 39.0–52.0)
Hemoglobin: 7.7 g/dL — ABNORMAL LOW (ref 13.0–17.0)
MCH: 28.1 pg (ref 26.0–34.0)
MCHC: 31.2 g/dL (ref 30.0–36.0)
MCV: 90.1 fL (ref 80.0–100.0)
Platelets: 202 K/uL (ref 150–400)
RBC: 2.74 MIL/uL — ABNORMAL LOW (ref 4.22–5.81)
RDW: 15.2 % (ref 11.5–15.5)
WBC: 13.1 K/uL — ABNORMAL HIGH (ref 4.0–10.5)
nRBC: 0.2 % (ref 0.0–0.2)

## 2020-05-18 LAB — GLUCOSE, CAPILLARY
Glucose-Capillary: 103 mg/dL — ABNORMAL HIGH (ref 70–99)
Glucose-Capillary: 106 mg/dL — ABNORMAL HIGH (ref 70–99)
Glucose-Capillary: 111 mg/dL — ABNORMAL HIGH (ref 70–99)
Glucose-Capillary: 115 mg/dL — ABNORMAL HIGH (ref 70–99)
Glucose-Capillary: 123 mg/dL — ABNORMAL HIGH (ref 70–99)
Glucose-Capillary: 90 mg/dL (ref 70–99)
Glucose-Capillary: 90 mg/dL (ref 70–99)
Glucose-Capillary: 96 mg/dL (ref 70–99)

## 2020-05-18 LAB — COMPREHENSIVE METABOLIC PANEL WITH GFR
ALT: 21 U/L (ref 0–44)
AST: 16 U/L (ref 15–41)
Albumin: 1.7 g/dL — ABNORMAL LOW (ref 3.5–5.0)
Alkaline Phosphatase: 149 U/L — ABNORMAL HIGH (ref 38–126)
Anion gap: 9 (ref 5–15)
BUN: 16 mg/dL (ref 6–20)
CO2: 32 mmol/L (ref 22–32)
Calcium: 8.2 mg/dL — ABNORMAL LOW (ref 8.9–10.3)
Chloride: 100 mmol/L (ref 98–111)
Creatinine, Ser: 0.51 mg/dL — ABNORMAL LOW (ref 0.61–1.24)
GFR calc Af Amer: >60 mL/min (ref >60)
GFR calc non Af Amer: >60 mL/min (ref >60)
Glucose, Bld: 114 mg/dL — ABNORMAL HIGH (ref 70–99)
Potassium: 3.8 mmol/L (ref 3.5–5.1)
Sodium: 141 mmol/L (ref 135–145)
Total Bilirubin: 0.7 mg/dL (ref 0.3–1.2)
Total Protein: 6.4 g/dL — ABNORMAL LOW (ref 6.5–8.1)

## 2020-05-18 LAB — TRIGLYCERIDES: Triglycerides: 170 mg/dL — ABNORMAL HIGH (ref <150)

## 2020-05-18 LAB — COOXEMETRY PANEL
Carboxyhemoglobin: 1.1 % (ref 0.5–1.5)
Methemoglobin: 1 % (ref 0.0–1.5)
O2 Saturation: 59.4 %
Total hemoglobin: 11.5 g/dL — ABNORMAL LOW (ref 12.0–16.0)

## 2020-05-18 LAB — MAGNESIUM: Magnesium: 1.9 mg/dL (ref 1.7–2.4)

## 2020-05-18 LAB — CK: Total CK: 30 U/L — ABNORMAL LOW (ref 49–397)

## 2020-05-18 MED ORDER — LOSARTAN POTASSIUM 25 MG PO TABS
12.5000 mg | ORAL_TABLET | Freq: Two times a day (BID) | ORAL | Status: DC
  Administered 2020-05-18: 12.5 mg
  Filled 2020-05-18: qty 1

## 2020-05-18 MED ORDER — POLYETHYLENE GLYCOL 3350 17 G PO PACK
17.0000 g | PACK | Freq: Every day | ORAL | Status: DC
  Administered 2020-05-23 – 2020-05-29 (×2): 17 g via ORAL
  Filled 2020-05-18 (×23): qty 1

## 2020-05-18 MED ORDER — PANTOPRAZOLE SODIUM 40 MG PO TBEC
40.0000 mg | DELAYED_RELEASE_TABLET | Freq: Every day | ORAL | Status: DC
  Administered 2020-05-18 – 2020-06-17 (×31): 40 mg via ORAL
  Filled 2020-05-18 (×31): qty 1

## 2020-05-18 MED ORDER — DOCUSATE SODIUM 100 MG PO CAPS
100.0000 mg | ORAL_CAPSULE | Freq: Two times a day (BID) | ORAL | Status: DC
  Administered 2020-05-19 – 2020-06-16 (×30): 100 mg via ORAL
  Filled 2020-05-18 (×53): qty 1

## 2020-05-18 MED ORDER — LOSARTAN POTASSIUM 25 MG PO TABS
12.5000 mg | ORAL_TABLET | Freq: Two times a day (BID) | ORAL | Status: DC
  Administered 2020-05-18: 12.5 mg via ORAL
  Filled 2020-05-18: qty 1

## 2020-05-18 MED ORDER — POTASSIUM CHLORIDE 20 MEQ/15ML (10%) PO SOLN
40.0000 meq | Freq: Two times a day (BID) | ORAL | Status: DC
  Administered 2020-05-18: 40 meq
  Filled 2020-05-18: qty 30

## 2020-05-18 MED ORDER — DOCUSATE SODIUM 50 MG/5ML PO LIQD: 100.0000 mg | Freq: Two times a day (BID) | ORAL | Status: DC

## 2020-05-18 MED ORDER — ORAL CARE MOUTH RINSE: 15.0000 mL | Freq: Two times a day (BID) | OROMUCOSAL | Status: DC

## 2020-05-18 MED ORDER — SODIUM CHLORIDE 0.9 % IV SOLN
600.0000 mg | Freq: Three times a day (TID) | INTRAVENOUS | Status: AC
  Administered 2020-05-18 – 2020-05-24 (×20): 600 mg via INTRAVENOUS
  Filled 2020-05-18 (×20): qty 600

## 2020-05-18 MED ORDER — ORAL CARE MOUTH RINSE
15.0000 mL | Freq: Two times a day (BID) | OROMUCOSAL | Status: DC
  Administered 2020-05-18 – 2020-06-10 (×33): 15 mL via OROMUCOSAL

## 2020-05-18 MED ORDER — MAGNESIUM SULFATE 2 GM/50ML IV SOLN
2.0000 g | Freq: Once | INTRAVENOUS | Status: AC
  Administered 2020-05-18: 2 g via INTRAVENOUS
  Filled 2020-05-18: qty 50

## 2020-05-18 MED ORDER — SPIRONOLACTONE 25 MG PO TABS
25.0000 mg | ORAL_TABLET | Freq: Every day | ORAL | Status: DC
  Administered 2020-05-18: 25 mg
  Filled 2020-05-18: qty 1

## 2020-05-18 MED ORDER — POTASSIUM CHLORIDE 20 MEQ/15ML (10%) PO SOLN
40.0000 meq | Freq: Two times a day (BID) | ORAL | Status: DC
  Administered 2020-05-18 – 2020-05-19 (×2): 40 meq via ORAL
  Filled 2020-05-18 (×2): qty 30

## 2020-05-18 MED ORDER — SODIUM CHLORIDE 0.9 % IV SOLN
500.0000 mg | Freq: Every day | INTRAVENOUS | Status: AC
  Administered 2020-05-18 – 2020-06-16 (×30): 500 mg via INTRAVENOUS
  Filled 2020-05-18 (×32): qty 10

## 2020-05-18 MED ORDER — SPIRONOLACTONE 25 MG PO TABS
25.0000 mg | ORAL_TABLET | Freq: Every day | ORAL | Status: DC
  Administered 2020-05-19 – 2020-05-26 (×8): 25 mg via ORAL
  Filled 2020-05-18 (×8): qty 1

## 2020-05-18 NOTE — Procedures (Signed)
Extubation Procedure Note  Patient Details:   Name: Clifford Clark DOB: 18-Jul-1991 MRN: [id number]   Airway Documentation:    Vent end date: 05/18/20 Vent end time: 0906   Evaluation  O2 sats: stable throughout Complications: No apparent complications Patient did tolerate procedure well. Bilateral Breath Sounds: Rhonchi   Yes   Patient was extubated to a 3L [place] without any complications, dyspnea or stridor noted. Patient was instructed on IS x 5, highest goal reached was .  Sloane Junkin, Margaretmary Dys 05/18/2020, 9:06 AM

## 2020-05-18 NOTE — Progress Notes (Signed)
Pt extubated without complication, placed on [place] 3L. No stridor noted. Pt stable at this time

## 2020-05-18 NOTE — Progress Notes (Signed)
Subjective:  He is greatly relieved to be off the ventilator and has no new complaints    Antibiotics:  Anti-infectives (From admission, onward)   Start     Dose/Rate Route Frequency Ordered Stop   05/18/20 2000  DAPTOmycin (CUBICIN) 500 mg in sodium chloride 0.9 % IVPB     Discontinue     500 mg 220 mL/hr over 30 Minutes Intravenous Daily 05/18/20 1131     05/18/20 1400  ceftaroline (TEFLARO) 600 mg in sodium chloride 0.9 % 250 mL IVPB     Discontinue     600 mg 250 mL/hr over 60 Minutes Intravenous Every 8 hours 05/18/20 1131     05/13/20 1730  meropenem (MERREM) 1 g in sodium chloride 0.9 % 100 mL IVPB  Status:  Discontinued        1 g 200 mL/hr over 30 Minutes Intravenous Every 8 hours 05/13/20 1720 05/14/20 1154   05/12/20 1600  vancomycin (VANCOREADY) IVPB 750 mg/150 mL  Status:  Discontinued        750 mg 150 mL/hr over 60 Minutes Intravenous Every 8 hours 05/12/20 1509 05/18/20 1131   05/05/20 1600  ceFAZolin (ANCEF) IVPB 2g/100 mL premix  Status:  Discontinued        2 g 200 mL/hr over 30 Minutes Intravenous Every 8 hours 05/05/20 1452 05/12/20 1509   05/04/20 1200  vancomycin (VANCOCIN) IVPB 1000 mg/200 mL premix  Status:  Discontinued        1,000 mg 200 mL/hr over 60 Minutes Intravenous Every 12 hours 05/04/20 1045 05/04/20 1055   05/04/20 1200  vancomycin (VANCOREADY) IVPB 750 mg/150 mL  Status:  Discontinued        750 mg 150 mL/hr over 60 Minutes Intravenous Every 8 hours 05/04/20 1055 05/05/20 1452   05/03/20 0945  vancomycin (VANCOCIN) powder  Status:  Discontinued          As needed 05/03/20 0945 05/03/20 1057   05/03/20 0920  ceFAZolin (ANCEF) 2-4 GM/100ML-% IVPB       Note to Pharmacy: Charmayne Sheer   : cabinet override      05/03/20 0920 05/03/20 2129   05/02/20 1500  vancomycin (VANCOREADY) IVPB 750 mg/150 mL  Status:  Discontinued        750 mg 150 mL/hr over 60 Minutes Intravenous Every 12 hours 05/02/20 1425 05/04/20 1045   05/01/20 1330   vancomycin variable dose per unstable renal function (pharmacist dosing)  Status:  Discontinued         Does not apply See admin instructions 05/01/20 1330 05/03/20 0810   04/29/20 1000  vancomycin (VANCOREADY) IVPB 750 mg/150 mL  Status:  Discontinued        750 mg 150 mL/hr over 60 Minutes Intravenous Every 8 hours 04/29/20 0834 05/01/20 1329   04/29/20 0400  metroNIDAZOLE (FLAGYL) IVPB 500 mg  Status:  Discontinued        500 mg 100 mL/hr over 60 Minutes Intravenous Every 8 hours 04/28/20 2122 04/29/20 0819   04/29/20 0400  ceFEPIme (MAXIPIME) 2 g in sodium chloride 0.9 % 100 mL IVPB  Status:  Discontinued        2 g 200 mL/hr over 30 Minutes Intravenous Every 8 hours 04/28/20 2138 04/29/20 0819   04/29/20 0100  vancomycin (VANCOCIN) IVPB 1000 mg/200 mL premix  Status:  Discontinued        1,000 mg 200 mL/hr over 60 Minutes Intravenous Every 8 hours 04/28/20  2146 04/29/20 0833   04/28/20 1715  ceFEPIme (MAXIPIME) 2 g in sodium chloride 0.9 % 100 mL IVPB        2 g 200 mL/hr over 30 Minutes Intravenous  Once 04/28/20 1708 04/28/20 1903   04/28/20 1715  metroNIDAZOLE (FLAGYL) IVPB 500 mg        500 mg 100 mL/hr over 60 Minutes Intravenous  Once 04/28/20 1708 04/28/20 2111   04/28/20 1715  vancomycin (VANCOCIN) IVPB 1000 mg/200 mL premix        1,000 mg 200 mL/hr over 60 Minutes Intravenous  Once 04/28/20 1708 04/28/20 1947      Medications: Scheduled Meds: . chlorhexidine gluconate (MEDLINE KIT)  15 mL Mouth Rinse BID  . Chlorhexidine Gluconate Cloth  6 each Topical Daily  . docusate  100 mg Per Tube BID  . enoxaparin (LOVENOX) injection  40 mg Subcutaneous Q24H  . furosemide  40 mg Intravenous BID  . losartan  12.5 mg Per Tube BID  . mouth rinse  15 mL Mouth Rinse 10 times per day  . mupirocin ointment  1 application Nasal BID  . pantoprazole sodium  20 mg Per Tube Daily  . polyethylene glycol  17 g Per Tube Daily  . potassium chloride  40 mEq Per Tube BID  . sodium chloride  flush  10-40 mL Intracatheter Q12H  . sodium chloride flush  3 mL Intravenous Q12H  . spironolactone  25 mg Per Tube Daily   Continuous Infusions: . sodium chloride 250 mL (05/18/20 0911)  . ceFTAROline (TEFLARO) IV    . DAPTOmycin (CUBICIN)  IV     PRN Meds:.acetaminophen (TYLENOL) oral liquid 160 mg/5 mL, acetaminophen **OR** acetaminophen, ipratropium-albuterol, LORazepam, metoprolol tartrate, ondansetron **OR** ondansetron (ZOFRAN) IV, ondansetron, sodium chloride flush    Objective: Weight change: -3.1 kg  Intake/Output Summary (Last 24 hours) at 05/18/2020 1152 Last data filed at 05/18/2020 0914 Gross per 24 hour  Intake 2940.11 ml  Output 3400 ml  Net -459.89 ml   Blood pressure (!) 129/82, pulse (!) 117, temperature 100 F (37.8 C), temperature source Oral, resp. rate 22, height '5\' 7"'  (1.702 m), weight 63.9 kg, SpO2 100 %. Temp:  [99.5 F (37.5 C)-100.2 F (37.9 C)] 100 F (37.8 C) (07/28 1139) Pulse Rate:  [46-117] 117 (07/28 0906) Resp:  [12-30] 22 (07/28 0906) BP: (88-129)/(64-85) 129/82 (07/28 0906) SpO2:  [97 %-100 %] 100 % (07/28 0906) FiO2 (%):  [40 %] 40 % (07/28 0756) Weight:  [63.9 kg] 63.9 kg (07/28 0530)  Physical Exam: General: Alert and oriented x3 CVS tachycardic, could not appreciate murmurs Chest: Rhonchorous Abdomen: soft non-distended,  Extremities: He has some edema bilaterally Skin: no rashes, some pen marks on his pain that he is using right with when he was on the ventilator Neuro: nonfocal  CBC:    BMET Recent Labs    05/17/20 0425 05/18/20 0344  NA 142 141  K 3.6 3.8  CL 108 100  CO2 26 32  GLUCOSE 115* 114*  BUN 15 16  CREATININE 0.58* 0.51*  CALCIUM 7.9* 8.2*     Liver Panel  Recent Labs    05/17/20 0425 05/18/20 0344  PROT 5.7* 6.4*  ALBUMIN 1.5* 1.7*  AST 21 16  ALT 24 21  ALKPHOS 150* 149*  BILITOT 0.9 0.7       Sedimentation Rate No results for input(s): ESRSEDRATE in the last 72 hours. C-Reactive  Protein No results for input(s): CRP in the last 72 hours.  Micro Results: Recent Results (from the past 720 hour(s))  Culture, blood (routine x 2)     Status: Abnormal   Collection Time: 04/28/20  3:45 PM   Specimen: BLOOD  Result Value Ref Range Status   Specimen Description   Final    BLOOD LEFT ANTECUBITAL Performed at [facility] [address] [place], [place] [address]    Special Requests   Final    BOTTLES DRAWN AEROBIC AND ANAEROBIC Blood Culture adequate volume Performed at [facility] [address], [place], [place] [address]    Culture  Setup Time   Final    GRAM POSITIVE COCCI IN BOTH AEROBIC AND ANAEROBIC BOTTLES CRITICAL RESULT CALLED TO, READ BACK BY AND VERIFIED WITH: PHARMD M. Lear Ng [phone] FCP    Culture (A)  Final    STAPHYLOCOCCUS AUREUS METHICILLIN RESISTANT ORGANISM WAS NOT RECOVERED IN CULTURE. REPEATED FOR CONFIRMATION Performed at [facility], [place] [address], [place], [place] [address]    Report Status 05/03/2020 FINAL  Final   Organism ID, Bacteria STAPHYLOCOCCUS AUREUS  Final      Susceptibility   Staphylococcus aureus - MIC*    CIPROFLOXACIN <=0.5 SENSITIVE Sensitive     ERYTHROMYCIN >=8 RESISTANT Resistant     GENTAMICIN <=0.5 SENSITIVE Sensitive     OXACILLIN 0.5 SENSITIVE Sensitive     TETRACYCLINE <=1 SENSITIVE Sensitive     VANCOMYCIN <=0.5 SENSITIVE Sensitive     TRIMETH/SULFA <=10 SENSITIVE Sensitive     CLINDAMYCIN <=0.25 SENSITIVE Sensitive     RIFAMPIN <=0.5 SENSITIVE Sensitive     Inducible Clindamycin NEGATIVE Sensitive     * STAPHYLOCOCCUS AUREUS  Blood Culture ID Panel (Reflexed)     Status: Abnormal   Collection Time: 04/28/20  3:45 PM  Result Value Ref Range Status   Enterococcus species NOT DETECTED NOT DETECTED Final   Listeria monocytogenes NOT DETECTED NOT DETECTED Final   Staphylococcus species DETECTED (A) NOT DETECTED Final    Comment: CRITICAL RESULT CALLED TO,  READ BACK BY AND VERIFIED WITH: PHARMD Chales Abrahams [phone] FCP    Staphylococcus aureus (BCID) DETECTED (A) NOT DETECTED Final    Comment: Methicillin (oxacillin)-resistant Staphylococcus aureus (MRSA). MRSA is predictably resistant to beta-lactam antibiotics (except ceftaroline). Preferred therapy is vancomycin unless clinically contraindicated. Patient requires contact precautions if  hospitalized. CRITICAL RESULT CALLED TO, READ BACK BY AND VERIFIED WITH: PHARMD M. Lear Ng [phone] FCP    Methicillin resistance DETECTED (A) NOT DETECTED Final    Comment: CRITICAL RESULT CALLED TO, READ BACK BY AND VERIFIED WITH: PHARMD M. RENZ [phone] FCP    Streptococcus species NOT DETECTED NOT DETECTED Final   Streptococcus agalactiae NOT DETECTED NOT DETECTED Final   Streptococcus pneumoniae NOT DETECTED NOT DETECTED Final   Streptococcus pyogenes NOT DETECTED NOT DETECTED Final   Acinetobacter baumannii NOT DETECTED NOT DETECTED Final   Enterobacteriaceae species NOT DETECTED NOT DETECTED Final   Enterobacter cloacae complex NOT DETECTED NOT DETECTED Final   Escherichia coli NOT DETECTED NOT DETECTED Final   Klebsiella oxytoca NOT DETECTED NOT DETECTED Final   Klebsiella pneumoniae NOT DETECTED NOT DETECTED Final   Proteus species NOT DETECTED NOT DETECTED Final   Serratia marcescens NOT DETECTED NOT DETECTED Final   Haemophilus influenzae NOT DETECTED NOT DETECTED Final   Neisseria meningitidis NOT DETECTED NOT DETECTED Final   Pseudomonas aeruginosa NOT DETECTED NOT DETECTED Final   Candida albicans NOT DETECTED NOT DETECTED Final   Candida  glabrata NOT DETECTED NOT DETECTED Final   Candida krusei NOT DETECTED NOT DETECTED Final   Candida parapsilosis NOT DETECTED NOT DETECTED Final   Candida tropicalis NOT DETECTED NOT DETECTED Final    Comment: Performed at [facility], [place] [address] [place], [place] [address]  Culture, blood (routine x 2)     Status: Abnormal   Collection  Time: 04/28/20  4:13 PM   Specimen: BLOOD  Result Value Ref Range Status   Specimen Description   Final    BLOOD RIGHT ANTECUBITAL Performed at [facility] [address], [place], [place] [address]    Special Requests   Final    BOTTLES DRAWN AEROBIC AND ANAEROBIC Blood Culture adequate volume Performed at [facility] [address], [place], [place] [address]    Culture  Setup Time   Final    GRAM POSITIVE COCCI IN BOTH AEROBIC AND ANAEROBIC BOTTLES CRITICAL VALUE NOTED.  VALUE IS CONSISTENT WITH PREVIOUSLY REPORTED AND CALLED VALUE.    Culture (A)  Final    STAPHYLOCOCCUS AUREUS METHICILLIN RESISTANT ORGANISM WAS NOT RECOVERED IN CULTURE. REPEATED FOR CONFIRMATION Performed at [facility], [place] [address], [place], [place] [address]    Report Status 05/03/2020 FINAL  Final   Organism ID, Bacteria STAPHYLOCOCCUS AUREUS  Final      Susceptibility   Staphylococcus aureus - MIC*    CIPROFLOXACIN <=0.5 SENSITIVE Sensitive     ERYTHROMYCIN >=8 RESISTANT Resistant     GENTAMICIN <=0.5 SENSITIVE Sensitive     OXACILLIN 0.5 SENSITIVE Sensitive     TETRACYCLINE <=1 SENSITIVE Sensitive     VANCOMYCIN <=0.5 SENSITIVE Sensitive     TRIMETH/SULFA <=10 SENSITIVE Sensitive     CLINDAMYCIN <=0.25 SENSITIVE Sensitive     RIFAMPIN <=0.5 SENSITIVE Sensitive     Inducible Clindamycin NEGATIVE Sensitive     * STAPHYLOCOCCUS AUREUS  Urine culture     Status: Abnormal   Collection Time: 04/28/20  7:00 PM   Specimen: In/Out Cath Urine  Result Value Ref Range Status   Specimen Description   Final    IN/OUT CATH URINE Performed at [facility] [address], [place], [place] [address]    Special Requests   Final    NONE Performed at [facility], [place] [address], [place], [place] [address]    Culture >=100,000 COLONIES/mL STAPHYLOCOCCUS AUREUS (A)  Final   Report Status 05/01/2020 FINAL  Final   Organism  ID, Bacteria STAPHYLOCOCCUS AUREUS (A)  Final      Susceptibility   Staphylococcus aureus - MIC*    CIPROFLOXACIN <=0.5 SENSITIVE Sensitive     GENTAMICIN <=0.5 SENSITIVE Sensitive     NITROFURANTOIN <=16 SENSITIVE Sensitive     OXACILLIN 0.5 SENSITIVE Sensitive     TETRACYCLINE <=1 SENSITIVE Sensitive     VANCOMYCIN <=0.5 SENSITIVE Sensitive     TRIMETH/SULFA <=10 SENSITIVE Sensitive     CLINDAMYCIN <=0.25 SENSITIVE Sensitive     RIFAMPIN <=0.5 SENSITIVE Sensitive     Inducible Clindamycin NEGATIVE Sensitive     * >=100,000 COLONIES/mL STAPHYLOCOCCUS AUREUS  SARS Coronavirus 2 by RT PCR (hospital order, performed in [place] hospital lab) Nasopharyngeal Nasopharyngeal Swab     Status: None   Collection Time: 04/28/20  7:03 PM   Specimen: Nasopharyngeal Swab  Result Value Ref Range Status   SARS Coronavirus 2 NEGATIVE NEGATIVE Final    Comment: (NOTE) SARS-CoV-2 target nucleic acids are NOT DETECTED.  The SARS-CoV-2 RNA is generally detectable in upper and lower respiratory specimens during the acute phase of infection. The lowest concentration of SARS-CoV-2 viral copies this assay can detect is 250 copies / mL. A negative result does not preclude SARS-CoV-2 infection and should not be used as the sole basis for treatment or other patient management decisions.  A negative result may occur with improper specimen collection / handling, submission of specimen other than nasopharyngeal swab, presence of viral mutation(s) within the areas targeted by this assay, and inadequate number of viral copies (<250 copies / mL). A negative result must be combined with clinical observations, patient history, and epidemiological information.  Fact Sheet for Patients:   StrictlyIdeas.no  Fact Sheet for Healthcare Providers: BankingDealers.co.za  This test is not yet approved or  cleared by the [place] FDA and has been authorized for  detection and/or diagnosis of SARS-CoV-2 by FDA under an Emergency Use Authorization (EUA).  This EUA will remain in effect (meaning this test can be used) for the duration of the COVID-19 declaration under Section 564(b)(1) of the Act, 21 U.S.C. section 360bbb-3(b)(1), unless the authorization is terminated or revoked sooner.  Performed at [facility], [place] [address] [place], [place] [address]   MRSA PCR Screening     Status: Abnormal   Collection Time: 04/28/20  7:45 PM   Specimen: Nasal Mucosa; Nasopharyngeal  Result Value Ref Range Status   MRSA by PCR POSITIVE (A) NEGATIVE Final    Comment:        The GeneXpert MRSA Assay (FDA approved for NASAL specimens only), is one component of a comprehensive MRSA colonization surveillance program. It is not intended to diagnose MRSA infection nor to guide or monitor treatment for MRSA infections. RESULT CALLED TO, READ BACK BY AND VERIFIED WITH: S SMITH AT 2314 ON 04/28/2020 BY MOSLEY,J  Performed at [facility], [place] [address] [place], [place] [address]   Culture, blood (Routine X 2) w Reflex to ID Panel     Status: None   Collection Time: 04/30/20  8:15 AM   Specimen: BLOOD  Result Value Ref Range Status   Specimen Description   Final    BLOOD LEFT ANTECUBITAL Performed at [facility], [place] [address], [place], [place] [address]    Special Requests   Final    BOTTLES DRAWN AEROBIC AND ANAEROBIC Blood Culture adequate volume Performed at [facility] [address], [place], [place] [address]    Culture   Final    NO GROWTH 5 DAYS Performed at [facility], [place] [address], [place], [place] [address]    Report Status 05/05/2020 FINAL  Final  Culture, blood (Routine X 2) w Reflex to ID Panel     Status: None   Collection Time: 04/30/20  8:15 AM   Specimen: BLOOD LEFT FOREARM  Result Value Ref Range Status   Specimen Description   Final    BLOOD LEFT  FOREARM Performed at [facility] [address] [place], [place] [address]    Special Requests   Final    BOTTLES DRAWN AEROBIC ONLY Blood Culture adequate volume Performed at [place] [address] [place], [place] [address]    Culture   Final    NO GROWTH 5 DAYS Performed at [facility], [place] [address], [place],  [address]    Report Status 05/05/2020 FINAL  Final  Fungus Culture With Stain  Status: None (Preliminary result)   Collection Time: 05/03/20  9:35 AM   Specimen: Soft Tissue, Other  Result Value Ref Range Status   Fungus Stain Final report  Final    Comment: (NOTE) Performed At: [facility] [place], [place] [id number] Rush Farmer MD [id number]    Fungus (Mycology) Culture PENDING  Incomplete   Fungal Source TISSUE  Final    Comment: RT WRIST Performed at [facility], [place], [place] [address] [place], [place] [address]   Aerobic/Anaerobic Culture (surgical/deep wound)     Status: None   Collection Time: 05/03/20  9:35 AM   Specimen: Soft Tissue, Other  Result Value Ref Range Status   Specimen Description   Final    TISSUE RT WRIST Performed at [place] [facility] [address] [place], [place] [address]    Special Requests   Final    PODIATRY Derrek [place] Performed at [place] [address], [place], [place] [address]    Gram Stain   Final    RARE WBC PRESENT,BOTH PMN AND MONONUCLEAR RARE GRAM POSITIVE COCCI IN PAIRS IN CLUSTERS    Culture   Final    RARE STAPHYLOCOCCUS AUREUS NO ANAEROBES ISOLATED Performed at [facility], [address] [place], [place] [address]    Report Status 05/08/2020 FINAL  Final   Organism ID, Bacteria STAPHYLOCOCCUS AUREUS  Final      Susceptibility   Staphylococcus aureus - MIC*    CIPROFLOXACIN <=0.5 SENSITIVE Sensitive     ERYTHROMYCIN >=8 RESISTANT Resistant      GENTAMICIN <=0.5 SENSITIVE Sensitive     OXACILLIN 0.5 SENSITIVE Sensitive     TETRACYCLINE <=1 SENSITIVE Sensitive     VANCOMYCIN <=0.5 SENSITIVE Sensitive     TRIMETH/SULFA <=10 SENSITIVE Sensitive     CLINDAMYCIN <=0.25 SENSITIVE Sensitive     RIFAMPIN <=0.5 SENSITIVE Sensitive     Inducible Clindamycin NEGATIVE Sensitive     * RARE STAPHYLOCOCCUS AUREUS  Fungus Culture Result     Status: None   Collection Time: 05/03/20  9:35 AM  Result Value Ref Range Status   Result 1 Comment  Final    Comment: (NOTE) KOH/Calcofluor preparation:  no fungus observed. Performed At: [facility] [place], [place] [id number] Rush Farmer MD [id number]   Fungus Culture With Stain     Status: None (Preliminary result)   Collection Time: 05/03/20  9:38 AM   Specimen: Soft Tissue, Other  Result Value Ref Range Status   Fungus Stain Final report  Final    Comment: (NOTE) Performed At: [facility] [address], [place] [id number] Rush Farmer MD [id number]    Fungus (Mycology) Culture PENDING  Incomplete   Fungal Source TISSUE  Final    Comment: LT HAND Performed at [facility], [place] [address] [place], [place] [address]   Aerobic/Anaerobic Culture (surgical/deep wound)     Status: None   Collection Time: 05/03/20  9:38 AM   Specimen: Soft Tissue, Other  Result Value Ref Range Status   Specimen Description   Final    TISSUE LT HAND Performed at [place] [address] [place], [place] [address]    Special Requests   Final    PATIENT ON FOLLOWING ANCEF, [facility] Performed at [place] [address] [place], [place] [address]    Gram Stain   Final    NO WBC SEEN RARE  GRAM POSITIVE COCCI IN PAIRS IN CLUSTERS    Culture   Final    FEW STAPHYLOCOCCUS AUREUS NO ANAEROBES ISOLATED Performed at [facility], [place] [address] [place], [place] [address]    Report Status  05/08/2020 FINAL  Final   Organism ID, Bacteria STAPHYLOCOCCUS AUREUS  Final      Susceptibility   Staphylococcus aureus - MIC*    CIPROFLOXACIN <=0.5 SENSITIVE Sensitive     ERYTHROMYCIN >=8 RESISTANT Resistant     GENTAMICIN <=0.5 SENSITIVE Sensitive     OXACILLIN 0.5 SENSITIVE Sensitive     TETRACYCLINE <=1 SENSITIVE Sensitive     VANCOMYCIN 1 SENSITIVE Sensitive     TRIMETH/SULFA <=10 SENSITIVE Sensitive     CLINDAMYCIN <=0.25 SENSITIVE Sensitive     RIFAMPIN <=0.5 SENSITIVE Sensitive     Inducible Clindamycin NEGATIVE Sensitive     * FEW STAPHYLOCOCCUS AUREUS  Fungus Culture Result     Status: None   Collection Time: 05/03/20  9:38 AM  Result Value Ref Range Status   Result 1 Comment  Final    Comment: (NOTE) KOH/Calcofluor preparation:  no fungus observed. Performed At: [facility] [address] [place], [place] [id number] Rush Farmer MD [id number]   Culture, blood (Routine X 2) w Reflex to ID Panel     Status: None   Collection Time: 05/04/20  5:19 PM   Specimen: BLOOD  Result Value Ref Range Status   Specimen Description   Final    BLOOD RIGHT ANTECUBITAL Performed at [place] [address] [place], [place] [address]    Special Requests   Final    BOTTLES DRAWN AEROBIC AND ANAEROBIC Blood Culture adequate volume Performed at [place] [address], [place], [place] [address]    Culture   Final    NO GROWTH 5 DAYS Performed at [facility], [place] [address] [place], [place] [address]    Report Status 05/09/2020 FINAL  Final  Culture, blood (Routine X 2) w Reflex to ID Panel     Status: None   Collection Time: 05/04/20  5:19 PM   Specimen: BLOOD  Result Value Ref Range Status   Specimen Description   Final    BLOOD LEFT ANTECUBITAL Performed at [place] [address] [place], [place] [address]    Special Requests   Final    BOTTLES DRAWN AEROBIC ONLY Blood Culture  adequate volume Performed at [place] [address], [place], [place] [address]    Culture   Final    NO GROWTH 5 DAYS Performed at [facility], [place] [address], [place], [place] [address]    Report Status 05/09/2020 FINAL  Final  Fungus Culture With Stain     Status: None (Preliminary result)   Collection Time: 05/10/20  9:10 AM   Specimen: Soft Tissue, Other  Result Value Ref Range Status   Fungus Stain Final report  Final    Comment: (NOTE) Performed At: [facility] [place], [place] [id number] Rush Farmer MD [id number]    Fungus (Mycology) Culture PENDING  Incomplete   Fungal Source TRICUSPID VALVE VEGETATION  Final    Comment: Performed at [facility], [place] [address] [place], [place] [address]  Aerobic/Anaerobic Culture (surgical/deep wound)     Status: None   Collection Time: 05/10/20  9:10 AM   Specimen: Soft Tissue, Other  Result Value Ref Range Status  Specimen Description TISSUE  Final   Special Requests TRICUSPID VALVE VEGETATION  Final   Gram Stain   Final    RARE WBC PRESENT, PREDOMINANTLY PMN ABUNDANT GRAM POSITIVE COCCI IN PAIRS IN CLUSTERS    Culture   Final    ABUNDANT METHICILLIN RESISTANT STAPHYLOCOCCUS AUREUS NO ANAEROBES ISOLATED Performed at [facility], [address], [place], [place] [address]    Report Status 05/15/2020 FINAL  Final   Organism ID, Bacteria METHICILLIN RESISTANT STAPHYLOCOCCUS AUREUS  Final      Susceptibility   Methicillin resistant staphylococcus aureus - MIC*    CIPROFLOXACIN <=0.5 SENSITIVE Sensitive     ERYTHROMYCIN >=8 RESISTANT Resistant     GENTAMICIN <=0.5 SENSITIVE Sensitive     OXACILLIN >=4 RESISTANT Resistant     TETRACYCLINE <=1 SENSITIVE Sensitive     VANCOMYCIN 1 SENSITIVE Sensitive     TRIMETH/SULFA <=10 SENSITIVE Sensitive     CLINDAMYCIN <=0.25 SENSITIVE Sensitive     RIFAMPIN <=0.5 SENSITIVE Sensitive     Inducible Clindamycin  NEGATIVE Sensitive     * ABUNDANT METHICILLIN RESISTANT STAPHYLOCOCCUS AUREUS  Acid Fast Smear (AFB)     Status: None   Collection Time: 05/10/20  9:10 AM   Specimen: Soft Tissue, Other  Result Value Ref Range Status   AFB Specimen Processing Concentration  Final   Acid Fast Smear Negative  Final    Comment: (NOTE) Performed At: [facility] [address] [place], [place] [id number] Rush Farmer MD VO:[id number]    Source (AFB) TRICUSPID VALVE VEGETATION  Final    Comment: Performed at [facility], [place] [address], [place], [place] [address]  Fungus Culture Result     Status: None   Collection Time: 05/10/20  9:10 AM  Result Value Ref Range Status   Result 1 Comment  Final    Comment: (NOTE) KOH/Calcofluor preparation:  no fungus observed. Performed At: [facility] [place], [place] [id number] Rush Farmer MD [id number]   Culture, respiratory (non-expectorated)     Status: None   Collection Time: 05/13/20  6:05 PM   Specimen: Tracheal Aspirate; Respiratory  Result Value Ref Range Status   Specimen Description TRACHEAL ASPIRATE  Final   Special Requests NONE  Final   Gram Stain   Final    NO WBC SEEN FEW GRAM POSITIVE COCCI Performed at [facility], [place] [address], [place], [place] [address]    Culture FEW METHICILLIN RESISTANT STAPHYLOCOCCUS AUREUS  Final   Report Status 05/15/2020 FINAL  Final   Organism ID, Bacteria METHICILLIN RESISTANT STAPHYLOCOCCUS AUREUS  Final      Susceptibility   Methicillin resistant staphylococcus aureus - MIC*    CIPROFLOXACIN <=0.5 SENSITIVE Sensitive     ERYTHROMYCIN >=8 RESISTANT Resistant     GENTAMICIN <=0.5 SENSITIVE Sensitive     OXACILLIN >=4 RESISTANT Resistant     TETRACYCLINE <=1 SENSITIVE Sensitive     VANCOMYCIN <=0.5 SENSITIVE Sensitive     TRIMETH/SULFA <=10 SENSITIVE Sensitive     CLINDAMYCIN <=0.25 SENSITIVE Sensitive     RIFAMPIN <=0.5 SENSITIVE Sensitive     Inducible  Clindamycin NEGATIVE Sensitive     * FEW METHICILLIN RESISTANT STAPHYLOCOCCUS AUREUS  Culture, blood (routine x 2)     Status: Abnormal   Collection Time: 05/13/20  6:21 PM   Specimen: BLOOD  Result Value Ref Range Status   Specimen Description BLOOD LEFT ANTECUBITAL  Final   Special Requests   Final  BOTTLES DRAWN AEROBIC AND ANAEROBIC Blood Culture adequate volume   Culture  Setup Time   Final    GRAM POSITIVE COCCI IN BOTH AEROBIC AND ANAEROBIC BOTTLES CRITICAL VALUE NOTED.  VALUE IS CONSISTENT WITH PREVIOUSLY REPORTED AND CALLED VALUE.    Culture (A)  Final    STAPHYLOCOCCUS AUREUS SUSCEPTIBILITIES PERFORMED ON PREVIOUS CULTURE WITHIN THE LAST 5 DAYS. Performed at [facility], [place] [address] [place], [place] [address]    Report Status 05/17/2020 FINAL  Final  Culture, blood (routine x 2)     Status: Abnormal   Collection Time: 05/13/20  6:30 PM   Specimen: BLOOD LEFT FOREARM  Result Value Ref Range Status   Specimen Description BLOOD LEFT FOREARM  Final   Special Requests   Final    BOTTLES DRAWN AEROBIC AND ANAEROBIC Blood Culture adequate volume   Culture  Setup Time   Final    GRAM POSITIVE COCCI IN CLUSTERS ANAEROBIC BOTTLE ONLY CRITICAL RESULT CALLED TO, READ BACK BY AND VERIFIED WITH: Ellin Mayhew Gaston [id number] AT 1441 BY CM Performed at [facility], [place] [address] [place], [place] [address]    Culture METHICILLIN RESISTANT STAPHYLOCOCCUS AUREUS (A)  Final   Report Status 05/17/2020 FINAL  Final   Organism ID, Bacteria METHICILLIN RESISTANT STAPHYLOCOCCUS AUREUS  Final      Susceptibility   Methicillin resistant staphylococcus aureus - MIC*    CIPROFLOXACIN <=0.5 SENSITIVE Sensitive     ERYTHROMYCIN >=8 RESISTANT Resistant     GENTAMICIN <=0.5 SENSITIVE Sensitive     OXACILLIN >=4 RESISTANT Resistant     TETRACYCLINE <=1 SENSITIVE Sensitive     VANCOMYCIN 2 SENSITIVE Sensitive     TRIMETH/SULFA <=10 SENSITIVE Sensitive     CLINDAMYCIN <=0.25  SENSITIVE Sensitive     RIFAMPIN <=0.5 SENSITIVE Sensitive     Inducible Clindamycin NEGATIVE Sensitive     * METHICILLIN RESISTANT STAPHYLOCOCCUS AUREUS  MRSA PCR Screening     Status: Abnormal   Collection Time: 05/13/20  7:45 PM   Specimen: Nasal Mucosa; Nasopharyngeal  Result Value Ref Range Status   MRSA by PCR POSITIVE (A) NEGATIVE Final    Comment:        The GeneXpert MRSA Assay (FDA approved for NASAL specimens only), is one component of a comprehensive MRSA colonization surveillance program. It is not intended to diagnose MRSA infection nor to guide or monitor treatment for MRSA infections. RESULT CALLED TO, READ BACK BY AND VERIFIED WITH: Bailey Mech RN 05/13/20 2117 JDW Performed at [facility] [address], [place], [place] [address]     Studies/Results: DG CHEST PORT 1 VIEW  Result Date: 05/18/2020 CLINICAL DATA:  Intubation, respiratory failure, endocarditis, septic emboli EXAM: PORTABLE CHEST 1 VIEW COMPARISON:  Portable exam 0552 hours compared to 05/17/2020 FINDINGS: Tip of endotracheal tube projects 3.2 cm above carina. Nasogastric tube extends into stomach. LEFT subclavian line tip projects over SVC near cavoatrial junction. Borderline enlargement of cardiac silhouette. Patchy BILATERAL pulmonary infiltrates favoring pneumonia. Cavitary lesion RIGHT upper lobe 2.3 x 1.2 cm, unchanged. Few additional areas of the pulmonary opacity are nodular in appearance consistent with septic emboli identified previously by CT. No pleural effusion or pneumothorax. IMPRESSION: BILATERAL pulmonary infiltrates consistent with pneumonia. Few nodular foci are identified likely reflecting septic emboli seen on prior CT with a small cavitary lesion again identified in the RIGHT upper lobe. Electronically Signed   By: Lavonia Dana M.D.   On: 05/18/2020 08:18  DG CHEST PORT 1 VIEW  Result Date: 05/17/2020 CLINICAL DATA:  Hypoxia EXAM: PORTABLE CHEST 1 VIEW COMPARISON:  May 14, 2020  FINDINGS: Endotracheal tube tip is 4.2 cm above the carina. Central catheter tip is in the superior vena cava. Nasogastric tube tip and side port are below the diaphragm. Right central catheter no longer evident. No pneumothorax. There is patchy airspace opacity throughout much of the right lung, essentially stable. Scattered areas of atelectatic change noted in the left mid lung and left base. Heart is upper normal in size with pulmonary vascularity normal. No evident adenopathy. No bone lesions. IMPRESSION: Tube and catheter positions as described without pneumothorax. Suspect multifocal pneumonia on the right, essentially stable. Areas of scattered atelectatic change on the left. Stable cardiac silhouette. Electronically Signed   By: Lowella Grip III M.D.   On: 05/17/2020 07:14      Assessment/Plan:  INTERVAL HISTORY:    UNFORTUNATELY BLOOD CULTURES ARE POSITIVE AGAIN   Principal Problem:   Endocarditis of tricuspid valve Active Problems:   Intravenous drug abuse (HCC)   Septic embolism (HCC)   Sepsis (HCC)   Thrombocytopenia (HCC)   Hypokalemia   Hyponatremia   MRSA bacteremia   Malnutrition of moderate degree   Threatening behavior   Hand abscess   Elevated BUN   Pressure injury of skin   Chest pain   Acute respiratory failure (HCC)    Clifford Clark is a 29 y.o. male with IV drug use history MSSA and now MRSA bacteremia with tricuspid valve endocarditis, severe TR with R heart failure,  septic emboli status post angio VAC with debridement of atrial mass and tricuspid valve vegetation.  1.  MSSA and MRSA bacteremia and tricuspid valve endocarditis status post angio VAC:  MRSA has recurred and undoubtedly seeded his lines.  Note we were not covering the MRSA initially identified on bio fire until recently when he already had central lines.  He still has a large vegetation on the tricuspid valve despite angio VAC.  He has a pericardial effusion which I have reviewed with  Dr. Haroldine Laws.  Dr. Kae Heller does not feel this looks like a purulent pericardial effusion but more of a transudative process.  MRSA has vancomycin creep to 2 now  We will change to daptomycin and teflaro combination for now    Hopefully central line will come out today.   He is going to need a protracted course of therapy directed at [facility] but also the MRSA  He will need cuspid valve repair if he becomes a candidate   #2 cavitary pneumonia this is all MSSA and MRSA pathology.  3.  Back pain: MRI did not show evidence of discitis in the thoracic or lumbar area but could have infection there and just not be showing up on MRI yet.  4.  IV drug use: He will need plan for addressing this root cause   LOS: 20 days   Alcide Evener 05/18/2020, 11:52 AM

## 2020-05-18 NOTE — Progress Notes (Signed)
Pharmacy Antibiotic Note  Clifford Clark is a 29 y.o. 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 Patient had recurrent MRSA bacteremia on 7/23 with an MIC to vancomycin of 2. This recurrence could be due to the inability to remove his lines, burden of infection, or a resistant strain of MRSA. We will switch him to ceftaroline and daptomycin combination for now. Daptomycin is not the optimal agent for his cavitary pneumonia alone but ceftaroline will be effective. Hopefully he will only need a few more weeks to treat this and then we can transition to a single agent to finish his treatment for endocarditis. He is now extubated, but still has an old central line that will need to be removed. Once that is removed we can use a peripheral line for his medications and give him a line holiday. He will need repeat blood cultures once this is removed and this will mark day 1 of therapy for his endocarditis. He will need 6 weeks.  Plan: Stop Vancomycin Start ceftaroline 600mg  q8h Start Daptomycin 500mg  q24h Monitor cultures, clinical status, renal fx, f/u duration     Height: 5\' 7"  (170.2 cm) Weight: 63.9 kg (140 lb 14 oz) IBW/kg (Calculated) : 66.1  Temp (24hrs), Avg:99.9 F (37.7 C), Min:99.5 F (37.5 C), Max:100.2 F (37.9 C)  Recent Labs  Lab 05/13/20 1834 05/13/20 1834 05/13/20 2050 05/14/20 1617 05/15/20 0414 05/16/20 0405 05/17/20 0425 05/18/20 0344  WBC 15.7*  --   --   --  16.9* 11.9* 10.5 13.1*  CREATININE 0.71   < >  --  0.64 0.59* 0.55* 0.58* 0.51*  LATICACIDVEN 1.5  --  1.2  --   --   --   --   --   VANCOTROUGH  --   --   --  14*  --   --   --   --    < > = values in this interval not displayed.    Estimated Creatinine Clearance: 122 mL/min (A) (by C-G formula based on SCr of 0.51 mg/dL (L)).    No Known Allergies  Antimicrobials this admission: Cefepime 7/8 >> 7/9 Vancomycin 7/8 >> 7/15, 7/22>>7/28 Metronidazole 7/8 >> 7/9   Cefazolin 7/16>>7/22 Meropenem 7/23>>7/24 Daptomycin 7/28>> Ceftaroline 7/28>>  Microbiology results: 7/8 BCx: BCID resulted 4/4 MRSA, BCx resulting as MSSA 7/8 UCx:  >100,000 colonies MSSA  7/8 MRSA PCR: positive  7/8 COVID-19 negative  7/8 HIV:  negative  7/8 hepatitis panel: hep C Ab positive 7/9: HCV RNA quant: not elevated  7/10 rpt BCx: NGTD  7/13 R WristCx & FungalCx: rare MSSA 7/13 L WristCx & FungalCx: few MSSA 7/14 BCx: negative 7/20 valve tissue: MRSA  7/23: trach aspirate : few Staph aureus 7/23 BCx: MRSA 7/23 MRSA PCR: positive    8/23, PharmD Infectious Disease Pharmacist  Phone: [phone] 05/18/2020

## 2020-05-18 NOTE — Progress Notes (Signed)
Advanced Heart Failure Rounding Note  PCP-Cardiologist: No primary care provider on file.    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 ER w/u showed multiple septic emboli to lungs with cavitary lesions. 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  Also found to have emboli to kidneys and hands. TEE with EF 60-65% small PFO. large TV vegetation with severe TR. No vegetations on Aov/MV despite left-sided emboli.  Underwent AngioJet removal of part of vegetation. Then developed MRSA sepsis and respiratory failure. Intubated and started on pressors. F/u echo LVEF 20-25%. RV normal.  hstrop 192 -> 190    Subjective:    Awake on vent. Writing on pad. Says he wants the tube out today because his lungs can't take it any more. Tmax coming down on vancomycin.  Diuresing on IV lasix. Co-ox 59% renal function stable. Weight down 7 pounds.    Objective:   Weight Range: 63.9 kg Body mass index is 22.06 kg/m.   Vital Signs:   Temp:  [99.1 F (37.3 C)-100.2 F (37.9 C)] 99.5 F (37.5 C) (07/28 0600) Pulse Rate:  [46-114] 92 (07/28 0630) Resp:  [12-38] 24 (07/28 0630) BP: (88-123)/(64-93) 107/73 (07/28 0630) SpO2:  [97 %-100 %] 100 % (07/28 0630) FiO2 (%):  [40 %] 40 % (07/28 0444) Weight:  [63.9 kg] 63.9 kg (07/28 0530) Last BM Date: 05/07/20  Weight change: Filed Weights   05/16/20 0446 05/17/20 0600 05/18/20 0530  Weight: 66.1 kg 67 kg 63.9 kg    Intake/Output:   Intake/Output Summary (Last 24 hours) at 05/18/2020 0710 Last data filed at 05/18/2020 0600 Gross per 24 hour  Intake 2511.63 ml  Output 3750 ml  Net -1238.37 ml      Physical Exam    CVP 18 General:  Awake on vent  HEENT: normal Neck: supple. JVP to jaw  . Carotids 2+ bilat; no bruits. No lymphadenopathy or thryomegaly appreciated. Cor: PMI nondisplaced. Regular rate & rhythm. 2/6 TR Lungs: clear Abdomen: soft, nontender, nondistended. No hepatosplenomegaly.  No bruits or masses. Good bowel sounds. Extremities: no cyanosis, clubbing, rash, 1+ edema Neuro: alert & orientedx3, cranial nerves grossly intact. moves all 4 extremities w/o difficulty. Affect pleasant   Telemetry   NSR 90s Personally reviewed   Labs    CBC Recent Labs    05/17/20 0425 05/18/20 0344  WBC 10.5 13.1*  HGB 7.2* 7.7*  HCT 23.5* 24.7*  MCV 92.5 90.1  PLT 137* 209   Basic Metabolic Panel Recent Labs    05/15/20 1714 05/16/20 0405 05/17/20 0425 05/18/20 0344  NA  --    < > 142 141  K  --    < > 3.6 3.8  CL  --    < > 108 100  CO2  --    < > 26 32  GLUCOSE  --    < > 115* 114*  BUN  --    < > 15 16  CREATININE  --    < > 0.58* 0.51*  CALCIUM  --    < > 7.9* 8.2*  MG 2.1  --   --  1.9  PHOS 2.3*  --   --   --    < > = values in this interval not displayed.   Liver Function Tests Recent Labs    05/17/20 0425 05/18/20 0344  AST 21 16  ALT 24 21  ALKPHOS 150* 149*  BILITOT 0.9 0.7  PROT 5.7* 6.4*  ALBUMIN 1.5* 1.7*   No results for input(s): LIPASE, AMYLASE in the last 72 hours. Cardiac Enzymes No results for input(s): CKTOTAL, CKMB, CKMBINDEX, TROPONINI in the last 72 hours.  BNP: BNP (last 3 results) No results for input(s): BNP in the last 8760 hours.  ProBNP (last 3 results) No results for input(s): PROBNP in the last 8760 hours.   D-Dimer No results for input(s): DDIMER in the last 72 hours. Hemoglobin A1C No results for input(s): HGBA1C in the last 72 hours. Fasting Lipid Panel Recent Labs    05/18/20 0344  TRIG 170*   Thyroid Function Tests No results for input(s): TSH, T4TOTAL, T3FREE, THYROIDAB in the last 72 hours.  Invalid input(s): FREET3  Other results:   Imaging    No results found.   Medications:     Scheduled Medications: . chlorhexidine gluconate (MEDLINE KIT)  15 mL Mouth Rinse BID  . Chlorhexidine Gluconate Cloth  6 each Topical Daily  . docusate  100 mg Per Tube BID  . enoxaparin (LOVENOX)  injection  40 mg Subcutaneous Q24H  . feeding supplement (PROSource TF)  45 mL Per Tube BID  . furosemide  40 mg Intravenous BID  . mouth rinse  15 mL Mouth Rinse 10 times per day  . mupirocin ointment  1 application Nasal BID  . pantoprazole sodium  20 mg Per Tube Daily  . polyethylene glycol  17 g Per Tube Daily  . sodium chloride flush  10-40 mL Intracatheter Q12H  . sodium chloride flush  3 mL Intravenous Q12H  . spironolactone  12.5 mg Per Tube Daily    Infusions: . sodium chloride    . feeding supplement (VITAL AF 1.2 CAL) 60 mL/hr at 05/18/20 0100  . fentaNYL infusion INTRAVENOUS 100 mcg/hr (05/18/20 0600)  . propofol (DIPRIVAN) infusion 20 mcg/kg/min (05/18/20 0600)  . vancomycin Stopped (05/18/20 0014)    PRN Medications: acetaminophen (TYLENOL) oral liquid 160 mg/5 mL, acetaminophen **OR** acetaminophen, fentaNYL, ipratropium-albuterol, LORazepam, metoprolol tartrate, ondansetron **OR** ondansetron (ZOFRAN) IV, ondansetron, sodium chloride flush   Assessment/Plan   1. 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 There was also amall flickering leaflet mass, suggestive of vegetation of the pulmonic valve. No mitral or aortic valve pathology. - s/p Angiovac debulking on 7/20 but still with large vegetation  - abx per ID, currently on vancomycin  - Fever curve coming down.  - Will eventually need TV repair if he is a candidate  2. Acute Hypoxic Respiratory Failure - vent management per PCCM - continue wean   3. Septic Shock  - 2/2 MRSA Bacteremia - improving, now off pressors. MAPs 70s-80s  - fevers improving  - continue abx per ID  4. Acute Systolic Heart Failure - initial Echo 7/9 w/ normal LVEF 50-55%. RV normal - TEE 7/16, LVEF normal 60-65%, RV normal - Limited Echo 7/24, LVEF severely reduced 53-64%, RV  systolic function hyperdynamic. MV w/ mild-mod MR but no seen vegetation  - Hs trop 192 -> 190. Trend flat, most c/w demand ischemia and not ACS - suspect acute drop in EF stress induced from septic shock. Doubt CAD - Co-ox marginal at 59% CVP up (in setting of severe TR) - Good diuresis with IV lasix. Weight down 7 pounds. Will continue. - increase spiro  to 25. Add losartan 12.5 bid - Repeat echo in a few days. Suspect LV function should recover fairly quickly   5. Anemia - Hgb 7.7 - transfuse for hgb <7.0   CRITICAL CARE Performed by: Glori Bickers  Total critical care time: 35 minutes  Critical care time was exclusive of separately billable procedures and treating other patients.  Critical care was necessary to treat or prevent imminent or life-threatening deterioration.  Critical care was time spent personally by me (independent of midlevel providers or residents) on the following activities: development of treatment plan with patient and/or surrogate as well as nursing, discussions with consultants, evaluation of patient's response to treatment, examination of patient, obtaining history from patient or surrogate, ordering and performing treatments and interventions, ordering and review of laboratory studies, ordering and review of radiographic studies, pulse oximetry and re-evaluation of patient's condition.     Length of Stay: [place], MD  05/18/2020, 7:10 AM  Advanced Heart Failure Team Pager [phone] (M-F; [place])  Please contact [facility] for night-coverage after hours (4p -7a ) and weekends on [email or web address]

## 2020-05-18 NOTE — Progress Notes (Signed)
NAME:  Clifford Clark, MRN:  [id number], DOB:  09/17/1991, LOS: 20 ADMISSION DATE:  04/28/2020, CONSULTATION DATE:  05/13/2020 REFERRING MD:  Albertine Grates CHIEF COMPLAINT:  Endocarditis  Brief History   29 yo male with PMH significant for IVDA (fentanyl), asthma admitted with tricuspid valve endocarditis, MRSA bacteremia.   History of present illness   Patient presented to ED on 7/08 after being found on the ground by a friend.  His work up revealed Tricuspid valve endocarditis with MRSA bacteremia. He was also found to have septic emboli to kidneys and lungs. CT of hands showed bilateral abscesses and underwent I&D on 7/13. 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   Intubated 7/23 for respiratory distress due to MRSA pneumonia/septic emboli, EF has dropped to 20 to 25% Course complicated by persistent fevers and MRSA bacteremia again noted on 7/23   Past Medical History  IVDA --Fentanyl Asthma  Significant Hospital Events   7/08: Admitted 7/13: I&D of bilateral and abscesses 7/23: transferred to ICU due to respiratory distress. Intubated.  7/24 ECHO reveals worsening EF  7/26: off pressors, Weaning sedation, following commands   Consults:  ID Orthopedics CVTS  Psych PCCM  Procedures:  7/13: I&D of bilateral and abscesses 7/20: Debridement of right atrial mass and debridement of tricuspid valve vegetation 7/22: PICC line placed 7/23: CVC Lt [place] >> 7/23: ETT >>   Significant Diagnostic Tests:  7/8: Abdominal U/S: Hepatomegaly 7/16: TEE: LVEF 60-65%, RV normal, left atrial thrombus, small pericardial effusion, MVR. Tricuspid valve vegetation.  Pulmonic valve vegetation. 7/21: [place] of LUE without evidence of superficial or deep vein thrombosis  7/23: CTA chest without PE, multifocal PNA and septic emboli -worsening compared to previous studies. 7/24: echo shows worsening cardiomyopathy. LVEF 20-25%  LV global hypokinesis. Hyperdynamic RV. Small circumferential pericardial effusion. Abnormal mitral valve with mild-moderate MVR. Mobile filamentous mass on tricuspid valve 0.5x1.75cm-- c/w vegetation. Severe TVR.   Micro Data:  7/8 Blood Cx: MRSA Bacteremia 7/13: Blood Cx: MRSA Bacteremia 7/13: Fungal Cx: NGTD 7/20: Blood Cx:  MRSA Bacteremia 7/23 Blood Cx: >> MRSA  7/23 respiratory Cx >> MRSA 7/27 blood >>  Antimicrobials:  Cefepime 7/8 >> 7/9 Vancomycin 7/8 >>7/15, 7/22>> Metronidazole 7/8 >> 7/9 Cefazolin 7/16>>7/22 Meropenem 7/23>>7/24  Interim history/subjective:  Remains critically ill Awake on propofol and fentanyl drips Low-grade febrile in spite of being on cooling blanket Good urine output with Lasix  Objective   Blood pressure 105/76, pulse (!) 107, temperature 99.5 F (37.5 C), resp. rate (!) 24, height 5\' 7"  (1.702 m), weight 63.9 kg, SpO2 100 %. CVP:  [2 mmHg-19 mmHg] 19 mmHg  Vent Mode: CPAP;PSV FiO2 (%):  [40 %] 40 % Set Rate:  [24 bmp] 24 bmp Vt Set:  [390 mL] 390 mL PEEP:  [5 cmH20] 5 cmH20 Pressure Support:  [5 cmH20-10 cmH20] 5 cmH20 Plateau Pressure:  [13 cmH20-19 cmH20] 19 cmH20   Intake/Output Summary (Last 24 hours) at 05/18/2020 0814 Last data filed at 05/18/2020 0700 Gross per 24 hour  Intake 2468.98 ml  Output 3750 ml  Net -1281.02 ml   Filed Weights   05/16/20 0446 05/17/20 0600 05/18/20 0530  Weight: 66.1 kg 67 kg 63.9 kg    Examination: General: Critically ill appearing young man, intubated, no distress HENT: ETT secure OGT secure. Anicteric sclera. Mild pallor, moderate secretions per RN Lungs: No rhonchi, decreased on right, no accessory muscle use Cardiovascular: S1-S2  tacky cap refill < 3 seconds. No appreciable JVD , 1+ LE edema bilaterally Abdomen: soft flat ndnt + bowel sounds  Extremities: No obvious joint deformity.  No cyanosis or clubbing I & D sites appear clean, sutures intact Neuro: Alert, interactive, follows  commands. [id number]  Resolved Hospital Problem list   AKI Elevated LFTs Septic shock  Assessment & Plan:   Acute hypoxic respiratory failure requiring intubation in setting of multifocal PNA with cavitary lesions, septic emboli   P -He is tolerating pressure support wean, has secretions but a good cough and should be able to handle -We will proceed with extubation  MRSA  MSSA Bacteremia with tricuspid & pulmonic endocarditis S/p Right atrial mass s/p angiovac 7/20 WBC trending back up, persistent fever Vancomycin was stopped 7/15-7/22 and this may be the reason for recurrent MRSA bacteremia - P - ID following - Cont vanc  -If repeat cultures from 7/27 show MRSA, then will have to search for occult nidus , consider draining pericardial effusion,? Repeat MRI spine, DC CVL -Unfortunately needs CVL at this time but if extubated then hopefully can obtain peripheral and DC CVL  Acute systolic heart failure -CHF service involved -Being diuresed with Lasix 40 every 12 with potassium supplementation -Aldactone and losartan added Hopeful for EF recovery  Anemia No evidence of bleeding Plan  Transfuse for HGB <7  Inadequate PO intake P -Continue EN  Substance abuse disorder , UDS negative this admit but positive for opiates and cocaine 11/2019 P -will need cessation counseling  Bilateral wrist abscesses, s/p I&D 7/13 -sutures per ortho       Daily Goals Checklist  Pain/Anxiety/Delirium protocol (if indicated): Propofol, Fentanyl  VAP protocol (if indicated): Bundle in place DVT prophylaxis: Enoxaparin Nutrition Status: EN GI prophylaxis: Pantoprazole Central lines: Right arm single-lumen PICC, left subclavian triple-lumen. Mobility/therapy needs: PT Code Status: Full code Family Communication: pt & mom Disposition: ICU  The patient is critically ill with multiple organ systems failure and requires high complexity decision making for assessment and support, frequent  evaluation and titration of therapies, application of advanced monitoring technologies and extensive interpretation of multiple databases. Care coordinated with ID and CHF team. Critical Care Time devoted to patient care services described in this note independent of APP/resident  time is 33 minutes.   Cyril Mourning MD. Tonny Bollman. [facility]  If no response to pager , please call 319 [phone]   05/18/2020

## 2020-05-19 ENCOUNTER — Inpatient Hospital Stay (HOSPITAL_COMMUNITY): Payer: Self-pay

## 2020-05-19 DIAGNOSIS — I5021 Acute systolic (congestive) heart failure: Secondary | ICD-10-CM

## 2020-05-19 LAB — COMPREHENSIVE METABOLIC PANEL
ALT: 18 U/L (ref 0–44)
AST: 18 U/L (ref 15–41)
Albumin: 1.8 g/dL — ABNORMAL LOW (ref 3.5–5.0)
Alkaline Phosphatase: 126 U/L (ref 38–126)
Anion gap: 9 (ref 5–15)
BUN: 10 mg/dL (ref 6–20)
CO2: 31 mmol/L (ref 22–32)
Calcium: 8 mg/dL — ABNORMAL LOW (ref 8.9–10.3)
Chloride: 99 mmol/L (ref 98–111)
Creatinine, Ser: 0.57 mg/dL — ABNORMAL LOW (ref 0.61–1.24)
GFR calc Af Amer: >60 mL/min (ref >60)
GFR calc non Af Amer: >60 mL/min (ref >60)
Glucose, Bld: 122 mg/dL — ABNORMAL HIGH (ref 70–99)
Potassium: 3.4 mmol/L — ABNORMAL LOW (ref 3.5–5.1)
Sodium: 139 mmol/L (ref 135–145)
Total Bilirubin: 0.7 mg/dL (ref 0.3–1.2)
Total Protein: 6.4 g/dL — ABNORMAL LOW (ref 6.5–8.1)

## 2020-05-19 LAB — GLUCOSE, CAPILLARY
Glucose-Capillary: 117 mg/dL — ABNORMAL HIGH (ref 70–99)
Glucose-Capillary: 137 mg/dL — ABNORMAL HIGH (ref 70–99)
Glucose-Capillary: 104 mg/dL — ABNORMAL HIGH (ref 70–99)
Glucose-Capillary: 132 mg/dL — ABNORMAL HIGH (ref 70–99)
Glucose-Capillary: 127 mg/dL — ABNORMAL HIGH (ref 70–99)

## 2020-05-19 LAB — CBC
HCT: 25.6 % — ABNORMAL LOW (ref 39.0–52.0)
Hemoglobin: 8.1 g/dL — ABNORMAL LOW (ref 13.0–17.0)
MCH: 27.6 pg (ref 26.0–34.0)
MCHC: 31.6 g/dL (ref 30.0–36.0)
MCV: 87.1 fL (ref 80.0–100.0)
Platelets: 240 10*3/uL (ref 150–400)
RBC: 2.94 MIL/uL — ABNORMAL LOW (ref 4.22–5.81)
RDW: 15.1 % (ref 11.5–15.5)
WBC: 13.5 10*3/uL — ABNORMAL HIGH (ref 4.0–10.5)
nRBC: 0 % (ref 0.0–0.2)

## 2020-05-19 LAB — ECHOCARDIOGRAM LIMITED
Area-P 1/2: 4.21 cm2
Height: 67 in
S' Lateral: 3.9 cm
Single Plane A4C EF: 43.8 %
Weight: 1925.94 oz

## 2020-05-19 MED ORDER — SACUBITRIL-VALSARTAN 24-26 MG PO TABS
1.0000 | ORAL_TABLET | Freq: Two times a day (BID) | ORAL | Status: DC
  Administered 2020-05-19 – 2020-06-17 (×57): 1 via ORAL
  Filled 2020-05-19 (×59): qty 1

## 2020-05-19 MED ORDER — POTASSIUM CHLORIDE CRYS ER 20 MEQ PO TBCR
60.0000 meq | EXTENDED_RELEASE_TABLET | Freq: Once | ORAL | Status: AC
  Administered 2020-05-19: 60 meq via ORAL
  Filled 2020-05-19: qty 3

## 2020-05-19 MED ORDER — POTASSIUM CHLORIDE CRYS ER 20 MEQ PO TBCR
40.0000 meq | EXTENDED_RELEASE_TABLET | Freq: Two times a day (BID) | ORAL | Status: DC
  Administered 2020-05-19 – 2020-05-31 (×20): 40 meq via ORAL
  Filled 2020-05-19 (×23): qty 2

## 2020-05-19 NOTE — Progress Notes (Signed)
  Echocardiogram 2D Echocardiogram has been performed.  Janalyn Harder 05/19/2020, 12:21 PM

## 2020-05-19 NOTE — Progress Notes (Signed)
NAME:  Clifford Clark, MRN:  [id number], DOB:  07/09/1991, LOS: 21 ADMISSION DATE:  04/28/2020, CONSULTATION DATE:  05/13/2020 REFERRING MD:  Albertine Grates CHIEF COMPLAINT:  Endocarditis  Brief History   29 yo male with PMH significant for IVDA (fentanyl), asthma admitted with tricuspid valve endocarditis, MRSA bacteremia.   History of present illness   Patient presented to ED on 7/08 after being found on the ground by a friend.  His work up revealed Tricuspid valve endocarditis with MRSA bacteremia. He was also found to have septic emboli to kidneys and lungs. CT of hands showed bilateral abscesses and underwent I&D on 7/13. 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   Intubated 7/23 for respiratory distress due to MRSA pneumonia/septic emboli, EF has dropped to 20 to 25% Course complicated by persistent fevers and MRSA bacteremia again noted on 7/23  Past Medical History  IVDA --Fentanyl Asthma  Significant Hospital Events   7/08: Admitted 7/13: I&D of bilateral and abscesses 7/23: transferred to ICU due to respiratory distress. Intubated.  7/24 ECHO reveals worsening EF  7/26: off pressors, Weaning sedation, following commands  7/28: extubated 7/29: hemodynamically stable, respiratory status stable on RA  Consults:  ID Orthopedics CVTS  Psych PCCM  Procedures:  7/13: I&D of bilateral and abscesses 7/20: Debridement of right atrial mass and debridement of tricuspid valve vegetation 7/22: PICC line placed 7/23: CVC Lt [place] >> 7/23: ETT >7/28  Significant Diagnostic Tests:  7/8: Abdominal U/S: Hepatomegaly 7/16: TEE: LVEF 60-65%, RV normal, left atrial thrombus, small pericardial effusion, MVR. Tricuspid valve vegetation.  Pulmonic valve vegetation. 7/21: [place] of LUE without evidence of superficial or deep vein thrombosis  7/23: CTA chest without PE, multifocal PNA and septic emboli -worsening  compared to previous studies. 7/24: echo shows worsening cardiomyopathy. LVEF 20-25% LV global hypokinesis. Hyperdynamic RV. Small circumferential pericardial effusion. Abnormal mitral valve with mild-moderate MVR. Mobile filamentous mass on tricuspid valve 0.5x1.75cm-- c/w vegetation. Severe TVR.   Micro Data:  7/8 Blood Cx: MRSA Bacteremia 7/13: Blood Cx: MRSA Bacteremia 7/13: Fungal Cx: NGTD 7/20: Blood Cx:  MRSA Bacteremia 7/23 Blood Cx: >> MRSA  7/23 respiratory Cx >> MRSA 7/27 blood >>  Antimicrobials:  Cefepime 7/8 >> 7/9 Vancomycin 7/8 >>7/15, 7/22>> Metronidazole 7/8 >> 7/9 Cefazolin 7/16>>7/22 Meropenem 7/23>>7/24  Interim history/subjective:  Tolerating extubation well On RA Remains HDS Has begun clear liquid diet and tolerating well   Objective   Blood pressure (!) 120/88, pulse 101, temperature 98.8 F (37.1 C), temperature source Oral, resp. rate (!) 30, height 5\' 7"  (1.702 m), weight 54.6 kg, SpO2 92 %. CVP:  [1 mmHg-28 mmHg] 5 mmHg  Vent Mode: CPAP;PSV FiO2 (%):  [40 %] 40 % PEEP:  [5 cmH20] 5 cmH20 Pressure Support:  [5 cmH20] 5 cmH20   Intake/Output Summary (Last 24 hours) at 05/19/2020 0747 Last data filed at 05/19/2020 0435 Gross per 24 hour  Intake 1689.1 ml  Output 7000 ml  Net -5310.9 ml   Filed Weights   05/17/20 0600 05/18/20 0530 05/19/20 0435  Weight: 67 kg 63.9 kg 54.6 kg    Examination: General: Chronically ill appearing young adult M, reclined in bed side lying NAD  HENT: NCAT pink mmm trachea midline. Anicteric sclera  Lungs: Scattered bibasilar rhonchi posteriorly. CTA anteriorly. Shallow, even, unlabored respirations  CV: RRR s1s2. Cap refill < 3 seconds. No JVD Abdomen: Soft flat ndnt normoactive  Extremities: I&D  sites clean with intact sutures. No obvious joint defo Neuro: Alert, interactive, follows commands. 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 7/20 WBC trending back up, persistent fever Vancomycin was stopped 7/15-7/22  Central access has been removed P - ID following - ceftaroline, dapto per ID - awaiting repeat cx data    Multifocal PNA with cavitary lesions, septic emboli  Extubated 7/28 P -Is, Flutter, continue abx as above  Acute systolic heart failure -hopeful for degree of EF recovery -ECHO with severe TV endocarditis, small PFO, severe TR  P -advanced heart failure following -HF changing losartan to entresto -cont spiro  -Diurese 7/29 -Repeat ECHO   Anemia No evidence of bleeding Plan Transfuse for HGB <7  Malnutrition, moderate -RDN consult 7/26 P -extubated 7/28 -- advance diet as tolerated    Substance abuse disorder , UDS negative this admit but positive for opiates and cocaine 11/2019 P -will need cessation counseling -TOC consult   Bilateral wrist abscesses, s/p I&D 7/13 -sutures per ortho.    Daily Goals Checklist  Pain/Anxiety/Delirium protocol (if indicated): na VAP protocol (if indicated): na DVT prophylaxis: Enoxaparin Nutrition Status: Advancing as tolerated. Currently clears  GI prophylaxis: Pantoprazole Mobility/therapy needs: PT Code Status: Full code Family Communication: Patient updated. Communication with mother pending Disposition: Stable for transfer out of ICU, will call [facility] today    Tessie Fass MSN, AGACNP-BC [facility] Pulmonary/Critical Care Medicine [phone] If no answer, [phone] 05/19/2020, 10:39 AM

## 2020-05-19 NOTE — Progress Notes (Addendum)
Occupational Therapy Evaluation Patient Details Name: Clifford Clark MRN: [id number] DOB: 24-Jul-1991 Today's Date: 05/19/2020    History of Present Illness 29 yo found down on the ground with bil hand abscesses s/p I&D bil hand and wrist 7/13, endocarditis of tricuspid valve and Rt atrial mass. s/p angiovac by CVTS 7/20. Intubated 7/23-7/28. PMHx: IVDU, asthma   Clinical Impression   PTA, [place] at least the last 30 days. Pt was residing in a "halfway house"/[facility] last year after discharging from a treatment facility. Per Mom, he was "kicked out" when he started using drugs again in November. Since that time he was living at friend's houses and then in the tent village for the last month. Mom states Clifford Clark would borrow a phone and call her @ 1x/wk for food/clothes. Mom's goal is for Clifford Clark to go to an inpatient drug rehab facility because "I enable him". She states "if he goes home with me he will end up back here again. He needs treatment. I'm willing to pay or do what I need to do to get him there". SW contacted and will plan to give resources to pt/Mom. Pt currently requires Min A for mobility and Mod A for LB ADL due to deficits listed below. Pt using hands functionally but demonstrates B hand weakness, especially intrinsic strength. HR over 120 with RR into 30s with minimal activity/pt complaining of dizziness.  Pt with apparent cognitive deficits and will benefit from further cognitive assessment by Speech Therapy. Will follow acutely to facilitate DC to next venue of care.     Follow Up Recommendations  Home health OT;Supervision/Assistance - 24 hour    Equipment Recommendations  3 in 1 bedside commode    Recommendations for Other Services  Speech Therapy Consult for Cognitive Eval     Precautions / Restrictions Precautions Precautions: Fall      Mobility Bed Mobility Overal bed mobility: Needs Assistance Bed Mobility: Supine to Sit;Sit to Supine      Supine to sit: Supervision Sit to supine: Supervision      Transfers Overall transfer level: Needs assistance   Transfers: Sit to/from Stand;Stand Pivot Transfers Sit to Stand: Min assist Stand pivot transfers: Min assist            Balance Overall balance assessment: Needs assistance;History of Falls (had fall this am in room)   Sitting balance-Leahy Scale: Fair       Standing balance-Leahy Scale: Poor                             ADL either performed or assessed with clinical judgement   ADL Overall ADL's : Needs assistance/impaired Eating/Feeding: Modified independent   Grooming: Set up;Sitting   Upper Body Bathing: Minimal assistance;Sitting   Lower Body Bathing: Moderate assistance;Sit to/from stand   Upper Body Dressing : Minimal assistance;Sitting   Lower Body Dressing: Moderate assistance;Sit to/from stand   Toilet Transfer: Minimal assistance;Stand-pivot     Toileting - Clothing Manipulation Details (indicate cue type and reason): condom cath     Functional mobility during ADLs: Minimal assistance (HHA) General ADL Comments: limited by dizziness and poor activity tolerance     Vision   Additional Comments: will further assess     Perception Perception Comments: will further assess   Praxis Praxis Praxis tested?: Within functional limits    Pertinent Vitals/Pain Pain Assessment: Faces Faces Pain Scale: Hurts even more Pain Location: B  hands; general discomfort Pain Descriptors / Indicators: Discomfort;Grimacing Pain Intervention(s): Limited activity within patient's tolerance     Hand Dominance Left   Extremity/Trunk Assessment Upper Extremity Assessment Upper Extremity Assessment: Generalized weakness;RUE deficits/detail;LUE deficits/detail RUE Deficits / Details: AROM WFL throughout with the exception of extensor lag ring adn little; unable to complete full composite extension - demonstrate lack of extension  (boutineer  position) of little PIP joint; weak ulnar function overall; 3/5 finger abduction; 3+/5 grip; poor in-hand manipulation with weak intrinsics but functional (multiple sutures from abcsesses) LUE Deficits / Details: similar to R   Lower Extremity Assessment Lower Extremity Assessment: Defer to PT evaluation   Cervical / Trunk Assessment Cervical / Trunk Assessment: Other exceptions Cervical / Trunk Exceptions: initial posteiror bias   Communication Communication Communication: No difficulties   Cognition Arousal/Alertness: Awake/alert Behavior During Therapy: Flat affect Overall Cognitive Status: Impaired/Different from baseline Area of Impairment: Orientation;Attention;Safety/judgement;Awareness;Problem solving                 Orientation Level: Disoriented to;Situation;Time Current Attention Level: Sustained     Safety/Judgement: Decreased awareness of safety;Decreased awareness of deficits Awareness: Emergent Problem Solving: Slow processing General Comments: Unable to state months of the year backwards; note some perseveration; thinks he has been in the hospital for 1 week (has been here 21 days); unable to remember names of some of his family members; had fall because he was trying to get to bed unassisted;will further assess   General Comments       Exercises Exercises: Other exercises Other Exercises Other Exercises: prayer stretch   Shoulder Instructions      Home Living Family/patient expects to be discharged to:: Private residence Living Arrangements: Parent Available Help at Discharge: Family;Available PRN/intermittently Type of Home: House Home Access: Stairs to enter [employer] of Steps: 1   Home Layout: One level     Bathroom Shower/Tub: Chief Strategy Officer: Standard Bathroom Accessibility: Yes How Accessible: Accessible via walker Home Equipment: None          Prior Functioning/Environment Level of Independence:  Independent        Comments: works in Aeronautical engineer, enjoys surfing and snowboarding; riding dirtbikes; drawing/coloring        OT Problem List: Decreased strength;Decreased activity tolerance;Impaired balance (sitting and/or standing);Decreased coordination;Decreased cognition;Decreased safety awareness;Decreased knowledge of use of DME or AE;Cardiopulmonary status limiting activity;Impaired UE functional use;Pain      OT Treatment/Interventions: Self-care/ADL training;Therapeutic exercise;Neuromuscular education;DME and/or AE instruction;Energy conservation;Therapeutic activities;Cognitive remediation/compensation;Patient/family education;Balance training    OT Goals(Current goals can be found in the care plan section) Acute Rehab OT Goals Patient Stated Goal: to get back to doing what he use to do (Mom's goal is drug rehab) OT Goal Formulation: With patient/family Time For Goal Achievement: 06/02/20 Potential to Achieve Goals: Good  OT Frequency: Min 2X/week   Barriers to D/C: Other (comment)  homeless       Co-evaluation              AM-PAC OT "6 Clicks" Daily Activity     Outcome Measure Help from another person eating meals?: None Help from another person taking care of personal grooming?: A Little Help from another person toileting, which includes using toliet, bedpan, or urinal?: A Little Help from another person bathing (including washing, rinsing, drying)?: A Lot Help from another person to put on and taking off regular upper body clothing?: A Little Help from another person to put on and taking off regular lower body  clothing?: A Lot 6 Click Score: 17   End of Session Nurse Communication: Mobility status;Other (comment) (living situation)  Activity Tolerance: Patient tolerated treatment well Patient left: in bed;with call bell/phone within reach;with bed alarm set;with family/visitor present  OT Visit Diagnosis: Unsteadiness on feet (R26.81);Muscle weakness  (generalized) (M62.81);History of falling (Z91.81);Other symptoms and signs involving cognitive function;Dizziness and giddiness (R42);Pain Pain - part of body: Hand (general discomfort)                Time: 1405-1430 OT Time Calculation (min): 25 min Charges:  OT General Charges $OT Visit: 1 Visit OT Evaluation $OT Eval Moderate Complexity: 1 Mod  Charmika Macdonnell, OT/L   Acute OT Clinical Specialist Acute Rehabilitation Services Pager [phone] Office [phone]   [facility] 05/19/2020, 3:21 PM

## 2020-05-19 NOTE — Progress Notes (Signed)
[facility]  Date of Admission:  04/28/2020     Total days of antibiotics 21         ASSESSMENT:  Mr. Attar blood cultures from 7/27 are without growth to date. Central line removed yesterday. Appears to be tolerating Ceftaroline and Daptomycin for disseminated MRSA infection and tricuspid valve endocarditis s/p angiovac. Check blood cultures today now that central line is removed. Monitor CK levels while on Daptomycin. Sensitivities to Daptomycin sent to [facility] and pending. Continue current dose of daptomycin and ceftraoline.   PLAN:  1. Continue Daptomycin and ceftaroline. 2. Blood culture today post line removal. 3. Monitor blood cultures for clearance of bacteremia. 4. Await sensitivities to Daptomycin from [facility].    Principal Problem:   Endocarditis of tricuspid valve Active Problems:   Intravenous drug abuse (HCC)   Septic embolism (HCC)   Sepsis (HCC)   Thrombocytopenia (HCC)   Hypokalemia   Hyponatremia   MRSA bacteremia   Malnutrition of moderate degree   Threatening behavior   Hand abscess   Elevated BUN   Pressure injury of skin   Chest pain   Acute respiratory failure (HCC)   . Chlorhexidine Gluconate Cloth  6 each Topical Daily  . docusate sodium  100 mg Oral BID  . enoxaparin (LOVENOX) injection  40 mg Subcutaneous Q24H  . mouth rinse  15 mL Mouth Rinse BID  . pantoprazole  40 mg Oral Daily  . polyethylene glycol  17 g Oral Daily  . potassium chloride  40 mEq Oral Q12H  . potassium chloride  60 mEq Oral Once  . sacubitril-valsartan  1 tablet Oral BID  . sodium chloride flush  3 mL Intravenous Q12H  . spironolactone  25 mg Oral Daily    SUBJECTIVE:  Afebrile overnight with no acute events. Central line removed yesterday. Feeling okay with no complaints.   No Known Allergies   Review of Systems: Review of Systems  Constitutional: Negative for chills, fever and weight loss.  Respiratory: Negative for cough,  shortness of breath and wheezing.   Cardiovascular: Negative for chest pain and leg swelling.  Gastrointestinal: Negative for abdominal pain, constipation, diarrhea, nausea and vomiting.  Skin: Negative for rash.      OBJECTIVE: Vitals:   05/19/20 0500 05/19/20 0600 05/19/20 0700 05/19/20 0800  BP: 105/75 119/85 (!) 120/88 (!) 129/86  Pulse: (!) 106 (!) 130 101 (!) 111  Resp: (!) 30 21 (!) 30 (!) 29  Temp:      TempSrc:      SpO2: 93% 97% 92% 96%  Weight:      Height:       Body mass index is 18.85 kg/m.  Physical Exam Constitutional:      General: He is not in acute distress.    Appearance: He is well-developed.     Comments: Lying in bed  Cardiovascular:     Rate and Rhythm: Regular rhythm. Tachycardia present.     Heart sounds: Normal heart sounds.  Pulmonary:     Effort: Pulmonary effort is normal.     Breath sounds: Normal breath sounds.  Skin:    General: Skin is warm and dry.  Neurological:     Mental Status: He is alert and oriented to person, place, and time.  Psychiatric:        Behavior: Behavior normal.        Thought Content: Thought content normal.        Judgment: Judgment normal.  Lab Results Lab Results  Component Value Date   WBC 13.5 (H) 05/19/2020   HGB 8.1 (L) 05/19/2020   HCT 25.6 (L) 05/19/2020   MCV 87.1 05/19/2020   PLT 240 05/19/2020    Lab Results  Component Value Date   CREATININE 0.57 (L) 05/19/2020   BUN 10 05/19/2020   NA 139 05/19/2020   K 3.4 (L) 05/19/2020   CL 99 05/19/2020   CO2 31 05/19/2020    Lab Results  Component Value Date   ALT 18 05/19/2020   AST 18 05/19/2020   ALKPHOS 126 05/19/2020   BILITOT 0.7 05/19/2020     Microbiology: Recent Results (from the past 240 hour(s))  Fungus Culture With Stain     Status: None (Preliminary result)   Collection Time: 05/10/20  9:10 AM   Specimen: Soft Tissue, Other  Result Value Ref Range Status   Fungus Stain Final report  Final    Comment: (NOTE) Performed  At: [facility] [address] [place], [place] [id number] Jolene Schimke MD TT:[id number]    Fungus (Mycology) Culture PENDING  Incomplete   Fungal Source TRICUSPID VALVE VEGETATION  Final    Comment: Performed at [facility], [address], [place], [place] [address]  Aerobic/Anaerobic Culture (surgical/deep wound)     Status: None   Collection Time: 05/10/20  9:10 AM   Specimen: Soft Tissue, Other  Result Value Ref Range Status   Specimen Description TISSUE  Final   Special Requests TRICUSPID VALVE VEGETATION  Final   Gram Stain   Final    RARE WBC PRESENT, PREDOMINANTLY PMN ABUNDANT GRAM POSITIVE COCCI IN PAIRS IN CLUSTERS    Culture   Final    ABUNDANT METHICILLIN RESISTANT STAPHYLOCOCCUS AUREUS NO ANAEROBES ISOLATED Performed at [facility], [address], [place], [place] [address]    Report Status 05/15/2020 FINAL  Final   Organism ID, Bacteria METHICILLIN RESISTANT STAPHYLOCOCCUS AUREUS  Final      Susceptibility   Methicillin resistant staphylococcus aureus - MIC*    CIPROFLOXACIN <=0.5 SENSITIVE Sensitive     ERYTHROMYCIN >=8 RESISTANT Resistant     GENTAMICIN <=0.5 SENSITIVE Sensitive     OXACILLIN >=4 RESISTANT Resistant     TETRACYCLINE <=1 SENSITIVE Sensitive     VANCOMYCIN 1 SENSITIVE Sensitive     TRIMETH/SULFA <=10 SENSITIVE Sensitive     CLINDAMYCIN <=0.25 SENSITIVE Sensitive     RIFAMPIN <=0.5 SENSITIVE Sensitive     Inducible Clindamycin NEGATIVE Sensitive     * ABUNDANT METHICILLIN RESISTANT STAPHYLOCOCCUS AUREUS  Acid Fast Smear (AFB)     Status: None   Collection Time: 05/10/20  9:10 AM   Specimen: Soft Tissue, Other  Result Value Ref Range Status   AFB Specimen Processing Concentration  Final   Acid Fast Smear Negative  Final    Comment: (NOTE) Performed At: [facility] [address] [place], [place] [id number] Jolene Schimke MD YB:[id number]    Source (AFB) TRICUSPID VALVE VEGETATION  Final    Comment:  Performed at [facility], [address], [place], [place] [address]  Fungus Culture Result     Status: None   Collection Time: 05/10/20  9:10 AM  Result Value Ref Range Status   Result 1 Comment  Final    Comment: (NOTE) KOH/Calcofluor preparation:  no fungus observed. Performed At: [facility] [address] [place], [place] [id number] Jolene Schimke MD HR:[id number]   Culture, respiratory (non-expectorated)  Status: None   Collection Time: 05/13/20  6:05 PM   Specimen: Tracheal Aspirate; Respiratory  Result Value Ref Range Status   Specimen Description TRACHEAL ASPIRATE  Final   Special Requests NONE  Final   Gram Stain   Final    NO WBC SEEN FEW GRAM POSITIVE COCCI Performed at [facility], [address] [place], [place] [address]    Culture FEW METHICILLIN RESISTANT STAPHYLOCOCCUS AUREUS  Final   Report Status 05/15/2020 FINAL  Final   Organism ID, Bacteria METHICILLIN RESISTANT STAPHYLOCOCCUS AUREUS  Final      Susceptibility   Methicillin resistant staphylococcus aureus - MIC*    CIPROFLOXACIN <=0.5 SENSITIVE Sensitive     ERYTHROMYCIN >=8 RESISTANT Resistant     GENTAMICIN <=0.5 SENSITIVE Sensitive     OXACILLIN >=4 RESISTANT Resistant     TETRACYCLINE <=1 SENSITIVE Sensitive     VANCOMYCIN <=0.5 SENSITIVE Sensitive     TRIMETH/SULFA <=10 SENSITIVE Sensitive     CLINDAMYCIN <=0.25 SENSITIVE Sensitive     RIFAMPIN <=0.5 SENSITIVE Sensitive     Inducible Clindamycin NEGATIVE Sensitive     * FEW METHICILLIN RESISTANT STAPHYLOCOCCUS AUREUS  Culture, blood (routine x 2)     Status: Abnormal (Preliminary result)   Collection Time: 05/13/20  6:21 PM   Specimen: BLOOD  Result Value Ref Range Status   Specimen Description BLOOD LEFT ANTECUBITAL  Final   Special Requests   Final    BOTTLES DRAWN AEROBIC AND ANAEROBIC Blood Culture adequate volume   Culture  Setup Time   Final    GRAM POSITIVE COCCI IN BOTH AEROBIC AND ANAEROBIC  BOTTLES CRITICAL VALUE NOTED.  VALUE IS CONSISTENT WITH PREVIOUSLY REPORTED AND CALLED VALUE.    Culture (A)  Final    STAPHYLOCOCCUS AUREUS SUSCEPTIBILITIES PERFORMED ON PREVIOUS CULTURE WITHIN THE LAST 5 DAYS. Sent to [facility] for further susceptibility testing. Performed at [facility], [address] [place], [place] [address]    Report Status PENDING  Incomplete  Culture, blood (routine x 2)     Status: Abnormal (Preliminary result)   Collection Time: 05/13/20  6:30 PM   Specimen: BLOOD LEFT FOREARM  Result Value Ref Range Status   Specimen Description BLOOD LEFT FOREARM  Final   Special Requests   Final    BOTTLES DRAWN AEROBIC AND ANAEROBIC Blood Culture adequate volume   Culture  Setup Time   Final    GRAM POSITIVE COCCI IN CLUSTERS ANAEROBIC BOTTLE ONLY CRITICAL RESULT CALLED TO, READ BACK BY AND VERIFIED WITH: Eduardo OsierHARMD M MACCIA [id number] AT 1441 BY CM Performed at [facility], [address] [place], [place] [address]    Culture METHICILLIN RESISTANT STAPHYLOCOCCUS AUREUS (A)  Final   Report Status PENDING  Incomplete   Organism ID, Bacteria METHICILLIN RESISTANT STAPHYLOCOCCUS AUREUS  Final      Susceptibility   Methicillin resistant staphylococcus aureus - MIC*    CIPROFLOXACIN <=0.5 SENSITIVE Sensitive     ERYTHROMYCIN >=8 RESISTANT Resistant     GENTAMICIN <=0.5 SENSITIVE Sensitive     OXACILLIN >=4 RESISTANT Resistant     TETRACYCLINE <=1 SENSITIVE Sensitive     VANCOMYCIN 2 SENSITIVE Sensitive     TRIMETH/SULFA <=10 SENSITIVE Sensitive     CLINDAMYCIN <=0.25 SENSITIVE Sensitive     RIFAMPIN <=0.5 SENSITIVE Sensitive     Inducible Clindamycin NEGATIVE Sensitive     * METHICILLIN RESISTANT STAPHYLOCOCCUS AUREUS  MRSA PCR Screening     Status: Abnormal  Collection Time: 05/13/20  7:45 PM   Specimen: Nasal Mucosa; Nasopharyngeal  Result Value Ref Range Status   MRSA by PCR POSITIVE (A) NEGATIVE Final    Comment:        The GeneXpert MRSA Assay  (FDA approved for NASAL specimens only), is one component of a comprehensive MRSA colonization surveillance program. It is not intended to diagnose MRSA infection nor to guide or monitor treatment for MRSA infections. RESULT CALLED TO, READ BACK BY AND VERIFIED WITH: Jarome Matin RN 05/13/20 2117 JDW Performed at [facility], [address], [place], [place] [address]      Marcos Eke, NP [facility]  05/19/2020  10:25 AM

## 2020-05-19 NOTE — Progress Notes (Signed)
This note also relates to the following rows which could not be included:1100, 1115 and 1320                      Pulse Rate - Cannot attach notes to unvalidated device data ECG Heart Rate - Cannot attach notes to unvalidated device data Resp - Cannot attach notes to unvalidated device data SpO2 - Cannot attach notes to unvalidated device data  Spoke with mother [name] via phone and in-person.  Informed her of fall this morning from chair s/p being seen by PT.  She was reassured and thankful that no injury had occurred. She indicated she would be to see him later in the afternoon.  All questions were addressed and answered at time of call.  Assured her we will continue monitor him and the MD had also been aware of the incident.

## 2020-05-19 NOTE — Plan of Care (Signed)
?  Problem: Education: ?Goal: Knowledge of General Education information will improve ?Description: Including pain rating scale, medication(s)/side effects and non-pharmacologic comfort measures ?Outcome: Progressing ?  ?Problem: Health Behavior/Discharge Planning: ?Goal: Ability to manage health-related needs will improve ?Outcome: Progressing ?  ?Problem: Clinical Measurements: ?Goal: Ability to maintain clinical measurements within normal limits will improve ?Outcome: Progressing ?Goal: Diagnostic test results will improve ?Outcome: Progressing ?Goal: Respiratory complications will improve ?Outcome: Progressing ?Goal: Cardiovascular complication will be avoided ?Outcome: Progressing ?  ?Problem: Activity: ?Goal: Risk for activity intolerance will decrease ?Outcome: Progressing ?  ?Problem: Nutrition: ?Goal: Adequate nutrition will be maintained ?Outcome: Progressing ?  ?Problem: Coping: ?Goal: Level of anxiety will decrease ?Outcome: Progressing ?  ?Problem: Elimination: ?Goal: Will not experience complications related to bowel motility ?Outcome: Progressing ?Goal: Will not experience complications related to urinary retention ?Outcome: Progressing ?  ?Problem: Pain Managment: ?Goal: General experience of comfort will improve ?Outcome: Progressing ?  ?Problem: Safety: ?Goal: Ability to remain free from injury will improve ?Outcome: Progressing ?  ?Problem: Skin Integrity: ?Goal: Risk for impaired skin integrity will decrease ?Outcome: Progressing ?  ?Problem: Clinical Measurements: ?Goal: Will remain free from infection ?Outcome: Not Progressing ?  ?

## 2020-05-19 NOTE — Evaluation (Signed)
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 s/p angiovac by CVTS 7/20. Intubated 7/23-7/28. PMHx: IVDU, asthma  Clinical Impression  Pt supine on arrival and states no pain but unsure if he can walk. Pt able to transfer to sitting, walk with min assist and sit in chair. Pt asking for cereal and milk but currently NPO and informed of such with RN educated for pt requests and mobility. Pt with flat affect, unaware of situation or time, with decreased strength, balance and function who will benefit from acute therapy to maximize mobility, safety and independence to decrease burden of care.   BP 144/112 in sitting SpO2 97% on RA HR 114    Follow Up Recommendations Home health PT;Supervision/Assistance - 24 hour    Equipment Recommendations  Rolling walker with 5" wheels    Recommendations for Other Services OT consult     Precautions / Restrictions Precautions Precautions: Fall Restrictions Weight Bearing Restrictions: No      Mobility  Bed Mobility Overal bed mobility: Needs Assistance Bed Mobility: Supine to Sit     Supine to sit: Min guard     General bed mobility comments: guarding for lines and safety, 2 attempts prior to able to sit. LOB to left with attempting to sit EOB and don socks  Transfers Overall transfer level: Needs assistance   Transfers: Sit to/from Stand;Stand Pivot Transfers Sit to Stand: Min assist Stand pivot transfers: Min assist       General transfer comment: assist for balance with posterior lean from bed and chair  Ambulation/Gait Ambulation/Gait assistance: Min assist Gait Distance (Feet): 200 Feet Assistive device: None Gait Pattern/deviations: Step-through pattern;Decreased stride  length;Narrow base of support   Gait velocity interpretation: 1.31 - 2.62 ft/sec, indicative of limited community ambulator General Gait Details: pt with short steps with narrow BOS, assist for balance with progressive improvement with distance  Stairs            Wheelchair Mobility    Modified Rankin (Stroke Patients Only)       Balance Overall balance assessment: Needs assistance Sitting-balance support: No upper extremity supported;Feet supported Sitting balance-Leahy Scale: Fair Sitting balance - Comments: pt able to sit EOB but LOB with donning socks     Standing balance-Leahy Scale: Poor Standing balance comment: physical assist for balance in standing                             Pertinent Vitals/Pain Pain Assessment: No/denies pain    Home Living Family/patient expects to be discharged to:: Private residence Living Arrangements: Parent Available Help at Discharge: Family;Available PRN/intermittently Type of Home: House Home Access: Stairs to enter   [employer] of Steps: 1 Home Layout: One level Home Equipment: None      Prior Function Level of Independence: Independent         Comments: works in Aeronautical engineer, enjoys surfing and snowboarding     Higher education careers adviser   Dominant Hand: Left    Extremity/Trunk Assessment   Upper Extremity Assessment Upper Extremity Assessment: Defer to OT evaluation    Lower Extremity Assessment Lower Extremity Assessment: Overall WFL for tasks assessed    Cervical / Trunk Assessment Cervical / Trunk Assessment: Normal  Communication  Communication: No difficulties  Cognition Arousal/Alertness: Awake/alert Behavior During Therapy: Flat affect Overall Cognitive Status: Impaired/Different from baseline Area of Impairment: Orientation;Safety/judgement                 Orientation Level: Time;Situation;Disoriented to       Safety/Judgement: Decreased awareness of deficits;Decreased  awareness of safety            General Comments      Exercises     Assessment/Plan    PT Assessment Patient needs continued PT services  PT Problem List Decreased strength;Decreased mobility;Decreased safety awareness;Decreased activity tolerance;Decreased balance;Decreased knowledge of use of DME;Decreased cognition       PT Treatment Interventions Gait training;Balance training;Therapeutic exercise;DME instruction;Functional mobility training;Therapeutic activities;Stair training;Patient/family education;Cognitive remediation    PT Goals (Current goals can be found in the Care Plan section)  Acute Rehab PT Goals Patient Stated Goal: return home PT Goal Formulation: With patient Time For Goal Achievement: 06/02/20 Potential to Achieve Goals: Good    Frequency Min 3X/week   Barriers to discharge Decreased caregiver support      Co-evaluation               AM-PAC PT "6 Clicks" Mobility  Outcome Measure Help needed turning from your back to your side while in a flat bed without using bedrails?: A Little Help needed moving from lying on your back to sitting on the side of a flat bed without using bedrails?: A Little Help needed moving to and from a bed to a chair (including a wheelchair)?: A Little Help needed standing up from a chair using your arms (e.g., wheelchair or bedside chair)?: A Little Help needed to walk in hospital room?: A Little Help needed climbing 3-5 steps with a railing? : A Lot 6 Click Score: 17    End of Session Equipment Utilized During Treatment: Gait belt Activity Tolerance: Patient tolerated treatment well Patient left: in chair;with call bell/phone within reach;with chair alarm set Nurse Communication: Mobility status PT Visit Diagnosis: Other abnormalities of gait and mobility (R26.89);Difficulty in walking, not elsewhere classified (R26.2)    Time: 1017-1040 PT Time Calculation (min) (ACUTE ONLY): 23 min   Charges:   PT  Evaluation $PT Eval Moderate Complexity: 1 Mod PT Treatments $Gait Training: 8-22 mins        Jeana Kersting P, PT Acute Rehabilitation Services Pager: [phone] Office: [phone]   Yordy Matton B Devrin Monforte 05/19/2020, 11:03 AM

## 2020-05-19 NOTE — Progress Notes (Signed)
Received pt from [facility] MEWS score 3. Pt alert and oriented. IV Abx infusing. Will monitor .

## 2020-05-19 NOTE — Progress Notes (Signed)
Sutures removed per MD/PA order.  Sites CD& I.

## 2020-05-19 NOTE — Progress Notes (Cosign Needed Addendum)
NAME:  Clifford Clark, MRN:  [id number], DOB:  January 11, 1991, LOS: 21 ADMISSION DATE:  04/28/2020, CONSULTATION DATE:  05/13/2020 REFERRING MD:  Albertine Grates CHIEF COMPLAINT:  Endocarditis  Brief History   29 yo male with PMH significant for IVDA (fentanyl), asthma admitted with tricuspid valve endocarditis, MRSA bacteremia.   History of present illness   Patient presented to ED on 7/08 after being found on the ground by a friend.  His work up revealed Tricuspid valve endocarditis with MRSA bacteremia. He was also found to have septic emboli to kidneys and lungs. CT of hands showed bilateral abscesses and underwent I&D on 7/13. 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   Intubated 7/23 for respiratory distress due to MRSA pneumonia/septic emboli, EF has dropped to 20 to 25% Course complicated by persistent fevers and MRSA bacteremia again noted on 7/23   Past Medical History  IVDA (Fentanyl) Asthma  Significant Hospital Events   7/08: Admitted 7/13: I&D of bilateral hand abscesses 7/23: transferred to ICU due to respiratory distress. Intubated.  7/24 ECHO reveals worsening EF  7/26: off pressors, Weaning sedation, following commands   Consults:  ID Orthopedics CVTS  Psych PCCM  Procedures:  7/13: I&D of bilateral and abscesses 7/20: Debridement of right atrial mass and debridement of tricuspid valve vegetation 7/22: PICC >> 7/25 7/23: CVC LSC >> 7/27 7/23: ETT >> 7/28   Significant Diagnostic Tests:  7/8: Abdominal U/S: Hepatomegaly  7/16: TEE: LVEF 60-65%, RV normal, left atrial thrombus, small pericardial effusion, MVR. Tricuspid valve vegetation.  Pulmonic valve vegetation.  7/21: [place] of LUE without evidence of superficial or deep vein thrombosis   7/23: CTA Chest >> Without PE, multifocal PNA and septic emboli -worsening compared to previous studies  7/23 CTA A/P >> Bibasilar cavitation and  consolidation related to septic embolization. New small right pleural effusion and progressive anasarca.   7/24 Echo >> Worsening cardiomyopathy. LVEF 20-25% LV global hypokinesis. Hyperdynamic RV. Small circumferential pericardial effusion. Abnormal mitral valve with mild-moderate MVR. Mobile filamentous mass on tricuspid valve 0.5x1.75cm-- c/w vegetation. Severe TVR.   7/28 CXR >> Bilateral pulmonary infiltrates consistent with pneumonia. Cavitary lesion RUL. Micro Data:  7/8 Blood Cx: MRSA Bacteremia 7/13: Blood Cx: MRSA Bacteremia 7/13: Fungal Cx: NGTD 7/20: Blood Cx:  MRSA Bacteremia 7/23 Blood Cx: >> MRSA  7/23 respiratory Cx >> MRSA 7/27 blood >>  7/8 Hep A/B >> Negative 7/8 Hep C >> Reactive 7/8 HIV >> Negative 7/8 SARS CoV2 >> Negative  Antimicrobials:  Cefepime 7/8 >> 7/9 Vancomycin 7/8 >>7/15, 7/22>>7/28 Metronidazole 7/8 >> 7/9 Cefazolin 7/16>>7/22 Meropenem 7/23>>7/24  Interim history/subjective:  Extubated yesterday. Denies complaints today. He was diuresed yesterday with 7L in UOP.  He has continued positive blood cultures for which antibiotic regimen was changed to daptomycin and ceftaroline by ID team. Additionally, he has had interval removal of his CVC.  Objective   Blood pressure (!) 129/86, pulse (!) 111, temperature 98.8 F (37.1 C), temperature source Oral, resp. rate (!) 29, height 5\' 7"  (1.702 m), weight 54.6 kg, SpO2 96 %. CVP:  [1 mmHg-28 mmHg] 5 mmHg      Intake/Output Summary (Last 24 hours) at 05/19/2020 0906 Last data filed at 05/19/2020 05/21/2020 Gross per 24 hour  Intake 1638.65 ml  Output 6450 ml  Net -4811.35 ml   Filed Weights   05/17/20 0600 05/18/20 0530 05/19/20 0435  Weight: 67 kg 63.9 kg 54.6  kg    Examination: General: White male laying in bed in no obvious distress HENT: NCAT. Anicteric sclera. MMM Lungs: Breath sounds CTAB on room air. No accessory muscle use, subcostal retractions, or nasal flaring Cardiovascular: S1/S2  auscultated. No JVD. Cap refill immediate. Distal extremities pink and warm Abdomen: Soft, flat, not distended and not tender to palpation Extremities: No obvious joint deformity. I & D sites appear clean, sutures intact. MAE equally Neuro: Alert, interactive, follows commands. PERRL  Resolved Hospital Problem list   AKI Elevated LFTs Septic shock Acute hypoxic respiratory failure  Assessment & Plan:   MRSA & MSSA Bacteremia  Tricuspid & pulmonic valve endocarditis  With worsening leukocytosis and low-grade fevers again. He had previously received vancomycin from 7/8-7/15 and again from 7/22-7/28. This was switched by ID to an alternate regimen. Still awaiting repeat culture report from 7/27. We have removed CVC. -ID team on board, appreciate all of the recommendations -We removed his vancomycin and started ceftaroline and daptomycin -Follow culture report -Echo today for reassessment. Will follow, may consider draining pericardial effusion   Acute HFrEF Echo from 7/24 with EF 20-25%, severe TR. -Heart failure consulted. Appreciate recommendations. -Started on ARNI -Continue AA -Echo ordered today for reassessment by heart failure team  Normocytic anemia Without evidence of bleeding. Likely secondary to prolonged hospitalization, frequent blood draws.  -Continue to monitor for signs/symptoms of bleeding -Consider transfusing for Hgb <7  Inadequate PO intake Appears to be resolving. He has been asking to eat and drink. -Advance diet as tolerated  Substance abuse disorder  UDS negative this admit but positive for opiates and cocaine 11/2019 -Will need cessation counseling closer to DC  Bilateral wrist abscesses I&D 7/13 -Suture duration and removal per ortho     Goals for today: -Follow culture report -Repeat echo -Advance diet. -OOB to chair -Consider transferring to PCU   Daily Goals Checklist  Pain/Anxiety/Delirium protocol (if indicated): Tylenol for pain VAP  protocol (if indicated): N/A DVT prophylaxis: Enoxaparin Nutrition Status: Clears, advance GI prophylaxis: Pantoprazole Central lines: N/A Mobility/therapy needs: PT Code Status: Full code Family Communication: Pending Disposition: ICU - transfer today  Rise Mu, AGACNP Student, 05/19/2020 [phone]

## 2020-05-19 NOTE — Progress Notes (Addendum)
Advanced Heart Failure Rounding Note  PCP-Cardiologist: No primary care provider on file.    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 ER w/u showed multiple septic emboli to lungs with cavitary lesions. 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  Also found to have emboli to kidneys and hands. TEE with EF 60-65% small PFO. large TV vegetation with severe TR. No vegetations on Aov/MV despite left-sided emboli.  Underwent AngioJet removal of part of vegetation. Then developed MRSA sepsis and respiratory failure. Intubated and started on pressors. F/u echo LVEF 20-25%. RV normal.  hstrop 192 -> 190.     Subjective:    Extubated yesterday.   Improving. No complaints today. Sitting up in bed.   Max temp past 24 hrs 100. WBC 13K.   Excellent diuresis w/ IV Lasix -7L in UOP yesterday. SCr normal. K 3.4. BP stable w/ losartan and spiro.   No central line access currently. Only peripheral IV.    Objective:   Weight Range: 54.6 kg Body mass index is 18.85 kg/m.   Vital Signs:   Temp:  [98.5 F (36.9 C)-100 F (37.8 C)] 98.8 F (37.1 C) (07/29 0346) Pulse Rate:  [93-130] 101 (07/29 0700) Resp:  [16-39] 30 (07/29 0700) BP: (88-129)/(60-90) 120/88 (07/29 0700) SpO2:  [92 %-100 %] 92 % (07/29 0700) Weight:  [54.6 kg] 54.6 kg (07/29 0435) Last BM Date: 05/07/20  Weight change: Filed Weights   05/17/20 0600 05/18/20 0530 05/19/20 0435  Weight: 67 kg 63.9 kg 54.6 kg    Intake/Output:   Intake/Output Summary (Last 24 hours) at 05/19/2020 0815 Last data filed at 05/19/2020 0435 Gross per 24 hour  Intake 1672.5 ml  Output 6450 ml  Net -4777.5 ml      Physical Exam    General:  Awake and sitting up in bed. No distress.  HEENT: normal Neck: supple. JVP ~8 cm  . Carotids 2+ bilat; no bruits. No lymphadenopathy or thryomegaly appreciated. Cor: PMI nondisplaced. Regular rhythm, tachy rate. 2/6 TR Lungs: clear, no crackles  or wheezing  Abdomen: soft, nontender, nondistended. No hepatosplenomegaly. No bruits or masses. Good bowel sounds. Extremities: no cyanosis, clubbing, rash, no edema Neuro: alert & orientedx3, cranial nerves grossly intact. moves all 4 extremities w/o difficulty. Affect pleasant   Telemetry   Sinus tach, low 100s  Personally reviewed   Labs    CBC Recent Labs    05/18/20 0344 05/19/20 0231  WBC 13.1* 13.5*  HGB 7.7* 8.1*  HCT 24.7* 25.6*  MCV 90.1 87.1  PLT 202 240   Basic Metabolic Panel Recent Labs    31/54/00 0344 05/19/20 0231  NA 141 139  K 3.8 3.4*  CL 100 99  CO2 32 31  GLUCOSE 114* 122*  BUN 16 10  CREATININE 0.51* 0.57*  CALCIUM 8.2* 8.0*  MG 1.9  --    Liver Function Tests Recent Labs    05/18/20 0344 05/19/20 0231  AST 16 18  ALT 21 18  ALKPHOS 149* 126  BILITOT 0.7 0.7  PROT 6.4* 6.4*  ALBUMIN 1.7* 1.8*   No results for input(s): LIPASE, AMYLASE in the last 72 hours. Cardiac Enzymes Recent Labs    05/18/20 1344  CKTOTAL 30*    BNP: BNP (last 3 results) No results for input(s): BNP in the last 8760 hours.  ProBNP (last 3 results) No results for input(s): PROBNP in the last 8760 hours.  D-Dimer No results for input(s): DDIMER in the last 72 hours. Hemoglobin A1C No results for input(s): HGBA1C in the last 72 hours. Fasting Lipid Panel Recent Labs    05/18/20 0344  TRIG 170*   Thyroid Function Tests No results for input(s): TSH, T4TOTAL, T3FREE, THYROIDAB in the last 72 hours.  Invalid input(s): FREET3  Other results:   Imaging    No results found.   Medications:     Scheduled Medications: . Chlorhexidine Gluconate Cloth  6 each Topical Daily  . docusate sodium  100 mg Oral BID  . enoxaparin (LOVENOX) injection  40 mg Subcutaneous Q24H  . furosemide  40 mg Intravenous BID  . losartan  12.5 mg Oral BID  . mouth rinse  15 mL Mouth Rinse BID  . pantoprazole  40 mg Oral Daily  . polyethylene glycol  17 g Oral  Daily  . potassium chloride  40 mEq Oral BID  . sodium chloride flush  3 mL Intravenous Q12H  . spironolactone  25 mg Oral Daily    Infusions: . sodium chloride Stopped (05/18/20 1552)  . ceFTAROline (TEFLARO) IV 600 mg (05/19/20 1749)  . DAPTOmycin (CUBICIN)  IV Stopped (05/18/20 2028)    PRN Medications: acetaminophen (TYLENOL) oral liquid 160 mg/5 mL, acetaminophen **OR** acetaminophen, ipratropium-albuterol, LORazepam, metoprolol tartrate, ondansetron **OR** ondansetron (ZOFRAN) IV, ondansetron   Assessment/Plan   1. 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 There was also amall flickering leaflet mass, suggestive of vegetation of the pulmonic valve. No mitral or aortic valve pathology. - s/p Angiovac debulking on 7/20 but still with large vegetation  - abx per ID, currently on vancomycin  - Fever curve coming down.  - Will eventually need TV repair if he is a candidate  2. Acute Hypoxic Respiratory Failure - Resolved. Extubated 7/28   3. Septic Shock  - 2/2 MRSA Bacteremia - improving, now off pressors. MAPs 70s-80s  - fevers improving  - continue abx per ID  4. Acute Systolic Heart Failure - initial Echo 7/9 w/ normal LVEF 50-55%. RV normal - TEE 7/16, LVEF normal 60-65%, RV normal - Limited Echo 7/24, LVEF severely reduced 20-25%, RV systolic function hyperdynamic. MV w/ mild-mod MR but no seen vegetation  - Hs trop 192 -> 190. Trend flat, most c/w demand ischemia and not ACS - suspect acute drop in EF stress induced from septic shock. Doubt CAD - Good diuresis with IV lasix. -7L out yesterday. Volume status improved.  - Continue IV Lasix 40 mg x 1 today and plan transition to PO tomorrow.  - Continue spiro 25 mg.  - Continue losartan 12.5 bid>>switch to Entresto soon if BP remains stable  - Repeat echo in a few  days. Suspect LV function should recover fairly quickly   5. Anemia - Hgb 8.1 - transfuse for hgb <7.0   6. Hypokalemia - K 3.4 - supp w/ KCl - continue spiro + losartan    Length of Stay: [address], PA-C  05/19/2020, 8:15 AM  Advanced Heart Failure Team Pager [phone] (M-F; 7a - 4p)  Please contact [facility] for night-coverage after hours (4p -7a ) and weekends on [email or web address]  Patient seen and examined with the above-signed Advanced Practice Provider and/or Housestaff. I personally reviewed laboratory data, imaging studies and relevant notes. I independently examined  the patient and formulated the important aspects of the plan. I have edited the note to reflect any of my changes or salient points. I have personally discussed the plan with the patient and/or family.  Extubated yesterday. Excellent diuresis with IV lasix. Nearing euvolemia. Still with low-grade temps.  General:  Sitting up in bed  No resp difficulty HEENT: normal Neck: supple. JVP 8 . Carotids 2+ bilat; no bruits. No lymphadenopathy or thryomegaly appreciated. Cor: PMI nondisplaced. Tachy regular 2/6 TR Lungs: clear Abdomen: soft, nontender, nondistended. No hepatosplenomegaly. No bruits or masses. Good bowel sounds. Extremities: no cyanosis, clubbing, rash, edema + osler's Neuro: alert & orientedx3, cranial nerves grossly intact. moves all 4 extremities w/o difficulty. Affect pleasant  Improving some from HF perspective. Will continue spiro. Switch losartan to Entresto. Will give one more dose of lasix today Repeat limited echo.   I doubt pericardial fluid is infected but can reassess on echo today.   Arvilla Meres, MD  8:50 AM

## 2020-05-19 NOTE — Progress Notes (Signed)
Patient ID: Clifford Clark, male   DOB: 05/29/1991, 29 y.o.   MRN: [id number]   LOS: 21 days   Subjective: Doing well w/r/t hands/wrists. Denies pain or functional deficit. OT remarked on some weakness, esp left little finger.   Objective: Vital signs in last 24 hours: Temp:  [98.1 F (36.7 C)-99.6 F (37.6 C)] 98.1 F (36.7 C) (07/29 1134) Pulse Rate:  [93-130] 120 (07/29 1400) Resp:  [21-39] 38 (07/29 1400) BP: (100-144)/(62-114) 122/83 (07/29 1400) SpO2:  [92 %-100 %] 97 % (07/29 1400) Weight:  [54.6 kg] 54.6 kg (07/29 0435) Last BM Date: 05/07/20   Laboratory  CBC Recent Labs    05/18/20 0344 05/19/20 0231  WBC 13.1* 13.5*  HGB 7.7* 8.1*  HCT 24.7* 25.6*  PLT 202 240   BMET Recent Labs    05/18/20 0344 05/19/20 0231  NA 141 139  K 3.8 3.4*  CL 100 99  CO2 32 31  GLUCOSE 114* 122*  BUN 16 10  CREATININE 0.51* 0.57*  CALCIUM 8.2* 8.0*     Physical Exam General appearance: alert and no distress  Bilateral shoulder, elbow, wrist, digits- Incisions C/D/I, nontender, no instability, no blocks to motion  Sens  Ax/R/M/U intact  Mot   Ax/ R/ PIN/ M/ AIN/ U intact  Rad 2+   Assessment/Plan: Bilateral hand/wrist infections -- Sutures ok to come out. Continue to work with OT on hand mobility/strength. May f/u with Dr. Eulah Pont as OP if needed.    Freeman Caldron, PA-C Orthopedic Surgery [phone] 05/19/2020

## 2020-05-19 NOTE — Progress Notes (Signed)
eLink Physician-Brief Progress Note Patient Name: Ules Marsala DOB: [birth year] MRN: [id number]   Date of Service  05/19/2020   Pt extubated yesterday and now asking RN if he can eat and drink. Pt is NPO. Endocarditis.  Camera:  On nasal o2.resting. VS stable.   eICU Interventions  Better to wait till AM to assess her NPO status, but can Try clear liquid in spoon , if no cough or able to swallow, can advance diet to soft.      Intervention Category Minor Interventions: Other:  Ranee Gosselin 05/19/2020, 3:40 AM

## 2020-05-20 DIAGNOSIS — I5021 Acute systolic (congestive) heart failure: Secondary | ICD-10-CM

## 2020-05-20 LAB — CBC
HCT: 34.2 % — ABNORMAL LOW (ref 39.0–52.0)
Hemoglobin: 10.7 g/dL — ABNORMAL LOW (ref 13.0–17.0)
MCH: 27.2 pg (ref 26.0–34.0)
MCHC: 31.3 g/dL (ref 30.0–36.0)
MCV: 87 fL (ref 80.0–100.0)
Platelets: 274 10*3/uL (ref 150–400)
RBC: 3.93 MIL/uL — ABNORMAL LOW (ref 4.22–5.81)
RDW: 15.1 % (ref 11.5–15.5)
WBC: 15.1 10*3/uL — ABNORMAL HIGH (ref 4.0–10.5)
nRBC: 0 % (ref 0.0–0.2)

## 2020-05-20 LAB — COMPREHENSIVE METABOLIC PANEL
ALT: 21 U/L (ref 0–44)
AST: 21 U/L (ref 15–41)
Albumin: 1.9 g/dL — ABNORMAL LOW (ref 3.5–5.0)
Alkaline Phosphatase: 112 U/L (ref 38–126)
Anion gap: 9 (ref 5–15)
BUN: 8 mg/dL (ref 6–20)
CO2: 24 mmol/L (ref 22–32)
Calcium: 8.2 mg/dL — ABNORMAL LOW (ref 8.9–10.3)
Chloride: 106 mmol/L (ref 98–111)
Creatinine, Ser: 0.63 mg/dL (ref 0.61–1.24)
GFR calc Af Amer: >60 mL/min (ref >60)
GFR calc non Af Amer: >60 mL/min (ref >60)
Glucose, Bld: 136 mg/dL — ABNORMAL HIGH (ref 70–99)
Potassium: 4.1 mmol/L (ref 3.5–5.1)
Sodium: 139 mmol/L (ref 135–145)
Total Bilirubin: 0.5 mg/dL (ref 0.3–1.2)
Total Protein: 6.9 g/dL (ref 6.5–8.1)

## 2020-05-20 LAB — GLUCOSE, CAPILLARY
Glucose-Capillary: 100 mg/dL — ABNORMAL HIGH (ref 70–99)
Glucose-Capillary: 103 mg/dL — ABNORMAL HIGH (ref 70–99)
Glucose-Capillary: 107 mg/dL — ABNORMAL HIGH (ref 70–99)
Glucose-Capillary: 108 mg/dL — ABNORMAL HIGH (ref 70–99)
Glucose-Capillary: 135 mg/dL — ABNORMAL HIGH (ref 70–99)
Glucose-Capillary: 99 mg/dL (ref 70–99)

## 2020-05-20 LAB — MAGNESIUM: Magnesium: 2 mg/dL (ref 1.7–2.4)

## 2020-05-20 MED ORDER — ADULT MULTIVITAMIN W/MINERALS CH
1.0000 | ORAL_TABLET | Freq: Every day | ORAL | Status: DC
  Administered 2020-05-20 – 2020-06-16 (×27): 1 via ORAL
  Filled 2020-05-20 (×27): qty 1

## 2020-05-20 MED ORDER — CARVEDILOL 3.125 MG PO TABS
3.1250 mg | ORAL_TABLET | Freq: Two times a day (BID) | ORAL | Status: DC
  Administered 2020-05-20 – 2020-05-25 (×11): 3.125 mg via ORAL
  Filled 2020-05-20 (×11): qty 1

## 2020-05-20 MED ORDER — ENSURE ENLIVE PO LIQD
237.0000 mL | Freq: Three times a day (TID) | ORAL | Status: DC
  Administered 2020-05-20 – 2020-06-13 (×70): 237 mL via ORAL

## 2020-05-20 NOTE — Progress Notes (Signed)
[facility] PROGRESS NOTE    Progress Note  Clifford Clark  ASN:[id number] DOB: [birth year] DOA: 04/28/2020 PCP: Patient, [name]     Brief Narrative:   Clifford Clark is an 29 y.o. 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  2D echo showed an EF of 60% with a PFO and a large vegetation in the tricuspid valve.  He underwent AngioJet removal of part of the vegetation,repeat a 2D echo showed an EF of 20%.  Extubated on 05/19/2020.  05/03/2020 I&D of abscess.  Grew MSSA. 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  Next  05/11/2020 upper extremity Doppler showed no DVT.  05/13/2020 CTA of the chest without PE multifocal and septic emboli.  With multiple cavitary lesions consolidation and anasarca.  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  04/28/2020 blood cultures grew MRSA repeated blood cultures from 05/13/2020 + for MRSA, repeated blood cultures on 05/17/2020 are negative till date 05/03/2020 fungal cultures remain negative till date.  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 Status post debulking with angio vac on 05/10/2020 due to large vegetation. ID is on board and appreciate assistance. Repeated 2D echo was on 05/19/2020 that showed an EF of 30%, with persistent tricuspid vegetation measuring 1.8 x 0.8 cm-to the posterior leaflet.  With severe tricuspid regurgitation. Need tricuspid valve repair if he is a candidate.  Acute respiratory failure with hypoxia: Extubated on 05/18/2020.  Septic shock requiring pressors: Secondary to MRSA bacteremia on admission started off pressors weaned off pressors and transfer to progressive unit on 05/20/2020.  Acute systolic heart failure question septic cardiomyopathy: Initial 2D echo on admission showed an EF of 60%, repeat a 2D echo on 05/14/2020 showed a reduced EF of 20%. He had a mild elevation in cardiac biomarkers which is likely due to demand ischemia and not ACS. Suspect drop in EF is due to septic shock. Heart failure team has been consulted and was started on IV Lasix Started having brisk diuresis and is now negative about 14 L, has diuresed over 10 L over the last 24 to 36 hours. Continue IV Lasix per heart failure team, he was started on spironolactone and losartan with goal to be switched to [facility] when blood pressure remained stable.  Normocytic anemia: Hemoglobin of 8 yesterday which seems to be improving compared to previous. Signs of overt bleeding.  In the  setting of infectious etiology.  HypoKalemia: Potassium repleted orally now on Aldactone and losartan basic metabolic panel is pending this morning.  Substance abuse disorder: Patient  admits to opiates and cocaine abuse. Will need counseling.  Bilateral wrist abscess: Orthopedic surgery was consulted and he was I&D on 05/03/2020 cultures grew MSSA. Suture removed by orthopedic.  Malnutrition of moderate degree Ensure 3 times daily.  DVT prophylaxis: lovenox Family Communication:none Status is: Inpatient  Remains inpatient appropriate because:Hemodynamically unstable   Dispo: The patient is from: Home              Anticipated d/c is to: Home              Anticipated d/c date is: > 3 days              Patient currently is not medically stable to d/c.        Code Status:     Code Status Orders  (From admission, onward)         Start     Ordered   04/28/20 2123  Full code  Continuous        04/28/20 2122        Code Status History    Date Active Date Inactive Code Status Order ID Comments User Context   01/24/2019 1806 01/28/2019 1001 Full Code [id number]  Oneta Rack, NP Inpatient   01/24/2019 1451 01/24/2019 1752 Full Code [id number]  Raeford Razor, MD ED   01/25/2013 1812 01/26/2013 1256 Full Code [id number]  [facility] D[facility] ED   Advance Care Planning Activity        IV Access:    Peripheral IV   Procedures and diagnostic studies:   ECHOCARDIOGRAM LIMITED  Result Date: 05/19/2020    ECHOCARDIOGRAM LIMITED REPORT   Patient Name:   Clifford Clark Date of Exam: 05/19/2020 Medical Rec #:  [id number]     Height:       67.0 in Accession #:    [id number]    Weight:       120.4 lb Date of Birth:  1991-06-22     BSA:          1.630 m Patient Age:    29 years      BP:           129/86 mmHg Patient Gender: M             HR:           96 bpm. Exam Location:  Inpatient Procedure: Limited Echo, Cardiac Doppler, Color Doppler and 3D Echo Indications:    I50.21 Acute systolic (congestive) heart failure  History:        Patient has prior history of Echocardiogram examinations, most                 recent 05/14/2020. Abnormal ECG, Endocarditis;                  Signs/Symptoms:Chest Pain. Septic emboli. IVDU. Respiratory                 failure.  Sonographer:    Sheralyn Boatman RDCS Referring Phys: 44 Roxy Horseman SIMMONS  Sonographer Comments: Technically difficult study due to poor echo windows. This is a limited study. IMPRESSIONS  1. Left ventricular ejection fraction, by estimation, is 30 to 35%. The left ventricle has moderately decreased function. The left ventricle demonstrates global hypokinesis. There is mild left ventricular hypertrophy.  2. Right ventricular systolic function is  normal. The right ventricular size is normal. There is normal pulmonary artery systolic pressure. The estimated right ventricular systolic pressure is 30.5 mmHg.  3. The mitral valve is normal in structure. Trivial mitral valve regurgitation.  4. Tricuspid valve vegetations are seen, measuring up to 1.8 x 0.8 cm attached to posterior leaflet and up to 1.8 x 1.0 cm attached to anterior leaflet. The tricuspid valve is abnormal. Tricuspid valve regurgitation is severe.  5. The aortic valve is tricuspid. Aortic valve regurgitation is not visualized. No aortic stenosis is present.  6. Small pericardial effusion  7. The inferior vena cava is normal in size with <50% respiratory variability, suggesting right atrial pressure of 8 mmHg. FINDINGS  Left Ventricle: Left ventricular ejection fraction, by estimation, is 30 to 35%. The left ventricle has moderately decreased function. The left ventricle demonstrates global hypokinesis. The left ventricular internal cavity size was normal in size. There is mild left ventricular hypertrophy. Right Ventricle: The right ventricular size is normal. Right ventricular systolic function is normal. There is normal pulmonary artery systolic pressure. The tricuspid regurgitant velocity is 2.37 m/s, and with an assumed right atrial pressure of 8 mmHg,  the estimated right ventricular systolic pressure is 30.5 mmHg. Pericardium: A small pericardial effusion is present.  Mitral Valve: The mitral valve is normal in structure. Trivial mitral valve regurgitation. Tricuspid Valve: Tricuspid valve vegetations are seen, measuring up to 1.8 x 0.8 cm attached to posterior leaflet and up to 1.8 x 1.0 cm attached to anterior leaflet. The tricuspid valve is abnormal. Tricuspid valve regurgitation is severe. Aortic Valve: The aortic valve is tricuspid. Aortic valve regurgitation is not visualized. No aortic stenosis is present. Aorta: The aortic root is normal in size and structure. Venous: The inferior vena cava is normal in size with less than 50% respiratory variability, suggesting right atrial pressure of 8 mmHg. LEFT VENTRICLE PLAX 2D LVIDd:         4.70 cm     Diastology LVIDs:         3.90 cm     LV e' lateral:   10.73 cm/s LV PW:         1.40 cm     LV E/e' lateral: 4.2 LV IVS:        0.90 cm     LV e' medial:    8.05 cm/s                            LV E/e' medial:  5.6  LV Volumes (MOD) LV vol d, MOD A4C: 91.5 ml LV vol s, MOD A4C: 51.4 ml LV SV MOD A4C:     91.5 ml RIGHT VENTRICLE             IVC RV S prime:     16.50 cm/s  IVC diam: 1.90 cm LEFT ATRIUM         Index LA diam:    3.40 cm 2.09 cm/m   AORTA Ao Root diam: 3.70 cm MITRAL VALVE               TRICUSPID VALVE MV Area (PHT): 4.21 cm    TR Peak grad:   22.5 mmHg MV Decel Time: 180 msec    TR Vmax:        237.00 cm/s MV E velocity: 45.40 cm/s MV A velocity: 72.00 cm/s MV E/A ratio:  0.63 Epifanio Lescheshristopher Schumann MD Electronically signed by Epifanio Lescheshristopher Schumann MD Signature Date/Time:  05/19/2020/3:54:47 PM    Final      Medical Consultants:    None.  Anti-Infectives:   Currently on daptomycin and Teflaro  Subjective:    Joseth Ibrahim relates no signs of withdrawals, denies any chest pain or shortness of breath  Objective:    Vitals:   05/20/20 0000 05/20/20 0300 05/20/20 0333 05/20/20 0731  BP:  105/85    Pulse:      Resp: 20 20    Temp: 99.1 F (37.3 C) 98.1 F (36.7 C)  98.2 F (36.8 C)  TempSrc: Oral  Oral  Oral  SpO2: 98% 97%    Weight:   58.7 kg   Height:       SpO2: 97 % O2 Flow Rate (L/min): 2 L/min FiO2 (%): 40 %   Intake/Output Summary (Last 24 hours) at 05/20/2020 0801 Last data filed at 05/20/2020 0333 Gross per 24 hour  Intake 1541.98 ml  Output 3150 ml  Net -1608.02 ml   Filed Weights   05/18/20 0530 05/19/20 0435 05/20/20 0333  Weight: 63.9 kg 54.6 kg 58.7 kg    Exam: General exam: In no acute distress. Respiratory system: Good air movement and clear to auscultation. Cardiovascular system: S1 & S2 heard, RRR.  Mild JVD Gastrointestinal system: Abdomen is nondistended, soft and nontender.  Extremities: No pedal edema. Skin: No rashes, lesions or ulcers   Data Reviewed:    Labs: Basic Metabolic Panel: Recent Labs  Lab 05/13/20 1834 05/13/20 1834 05/14/20 1617 05/14/20 1617 05/15/20 0414 05/15/20 0414 05/15/20 1714 05/16/20 0405 05/16/20 0405 05/17/20 0425 05/17/20 0425 05/18/20 0344 05/19/20 0231  NA 131*   < > 137   < > 139  --   --  140  --  142  --  141 139  K 4.1   < > 3.6   < > 3.6   < >  --  4.0   < > 3.6   < > 3.8 3.4*  CL 98   < > 104   < > 106  --   --  107  --  108  --  100 99  CO2 24   < > 25   < > 24  --   --  27  --  26  --  32 31  GLUCOSE 163*   < > 120*   < > 136*  --   --  110*  --  115*  --  114* 122*  BUN 13   < > 19   < > 21*  --   --  20  --  15  --  16 10  CREATININE 0.71   < > 0.64   < > 0.59*  --   --  0.55*  --  0.58*  --  0.51* 0.57*  CALCIUM 7.4*   < > 7.1*   < > 7.3*  --   --  7.7*  --  7.9*  --  8.2* 8.0*  MG 1.9  --  2.0  --  2.1  --  2.1  --   --   --   --  1.9  --   PHOS 4.1  --  3.8  --  3.0  --  2.3*  --   --   --   --   --   --    < > = values in this interval not displayed.   GFR Estimated Creatinine Clearance: 112.1 mL/min (A) (by C-G formula based  on SCr of 0.57 mg/dL (L)). Liver Function Tests: Recent Labs  Lab 05/15/20 0414 05/16/20 0405 05/17/20 0425 05/18/20 0344 05/19/20 0231  AST 20 24 21 16  18   ALT 25 25 24 21 18   ALKPHOS 98 144* 150* 149* 126  BILITOT 0.5 0.8 0.9 0.7 0.7  PROT 5.7* 5.9* 5.7* 6.4* 6.4*  ALBUMIN 1.3* 1.5* 1.5* 1.7* 1.8*   No results for input(s): LIPASE, AMYLASE in the last 168 hours. No results for input(s): AMMONIA in the last 168 hours. Coagulation profile Recent Labs  Lab 05/15/20 0414  INR 1.4*   COVID-19 Labs  No results for input(s): DDIMER, FERRITIN, LDH, CRP in the last 72 hours.  Lab Results  Component Value Date   SARSCOV2NAA NEGATIVE 04/28/2020    CBC: Recent Labs  Lab 05/13/20 0839 05/13/20 1815 05/15/20 0414 05/16/20 0405 05/17/20 0425 05/18/20 0344 05/19/20 0231  WBC 17.8*   < > 16.9* 11.9* 10.5 13.1* 13.5*  NEUTROABS 14.9*  --   --   --   --   --   --   HGB 8.1*   < > 8.0* 7.5* 7.2* 7.7* 8.1*  HCT 24.5*   < > 24.3* 24.0* 23.5* 24.7* 25.6*  MCV 88.8   < > 90.0 90.2 92.5 90.1 87.1  PLT 124*   < > 128* 133* 137* 202 240   < > = values in this interval not displayed.   Cardiac Enzymes: Recent Labs  Lab 05/18/20 1344  CKTOTAL 30*   BNP (last 3 results) No results for input(s): PROBNP in the last 8760 hours. CBG: Recent Labs  Lab 05/19/20 1541 05/19/20 2043 05/20/20 0008 05/20/20 0432 05/20/20 0729  GLUCAP 132* 127* 103* 108* 99   D-Dimer: No results for input(s): DDIMER in the last 72 hours. Hgb A1c: No results for input(s): HGBA1C in the last 72 hours. Lipid Profile: Recent Labs    05/18/20 0344  TRIG 170*   Thyroid function studies: No results for input(s): TSH, T4TOTAL, T3FREE, THYROIDAB in the last 72 hours.  Invalid input(s): FREET3 Anemia work up: No results for input(s): VITAMINB12, FOLATE, FERRITIN, TIBC, IRON, RETICCTPCT in the last 72 hours. Sepsis Labs: Recent Labs  Lab 05/13/20 1834 05/13/20 2050 05/15/20 0414 05/16/20 0405 05/17/20 0425 05/18/20 0344 05/19/20 0231  WBC 15.7*  --    < > 11.9* 10.5 13.1* 13.5*  LATICACIDVEN 1.5 1.2  --   --   --   --   --    < > = values in this  interval not displayed.   Microbiology Recent Results (from the past 240 hour(s))  Fungus Culture With Stain     Status: None (Preliminary result)   Collection Time: 05/10/20  9:10 AM   Specimen: Soft Tissue, Other  Result Value Ref Range Status   Fungus Stain Final report  Final    Comment: (NOTE) Performed At: [facility] [address] [place], [address] [place] [id number]    Fungus (Mycology) Culture PENDING  Incomplete   Fungal Source TRICUSPID VALVE VEGETATION  Final    Comment: Performed at [facility], [address] [place], [address] [place]  Aerobic/Anaerobic Culture (surgical/deep wound)     Status: None   Collection Time: 05/10/20  9:10 AM   Specimen: Soft Tissue, Other  Result Value Ref Range Status   Specimen Description TISSUE  Final   Special Requests TRICUSPID VALVE VEGETATION  Final   Gram Stain   Final  RARE WBC PRESENT, PREDOMINANTLY PMN ABUNDANT GRAM POSITIVE COCCI IN PAIRS IN CLUSTERS    Culture   Final    ABUNDANT METHICILLIN RESISTANT STAPHYLOCOCCUS AUREUS NO ANAEROBES ISOLATED Performed at [facility], [address], [place], [place] [address]    Report Status 05/15/2020 FINAL  Final   Organism ID, Bacteria METHICILLIN RESISTANT STAPHYLOCOCCUS AUREUS  Final      Susceptibility   Methicillin resistant staphylococcus aureus - MIC*    CIPROFLOXACIN <=0.5 SENSITIVE Sensitive     ERYTHROMYCIN >=8 RESISTANT Resistant     GENTAMICIN <=0.5 SENSITIVE Sensitive     OXACILLIN >=4 RESISTANT Resistant     TETRACYCLINE <=1 SENSITIVE Sensitive     VANCOMYCIN 1 SENSITIVE Sensitive     TRIMETH/SULFA <=10 SENSITIVE Sensitive     CLINDAMYCIN <=0.25 SENSITIVE Sensitive     RIFAMPIN <=0.5 SENSITIVE Sensitive     Inducible Clindamycin NEGATIVE Sensitive     * ABUNDANT METHICILLIN RESISTANT STAPHYLOCOCCUS AUREUS  Acid Fast Smear (AFB)     Status: None   Collection Time: 05/10/20  9:10 AM   Specimen: Soft Tissue,  Other  Result Value Ref Range Status   AFB Specimen Processing Concentration  Final   Acid Fast Smear Negative  Final    Comment: (NOTE) Performed At: [facility] [address] [place], [place] [id number] Jolene Schimke MD GE:[id number]    Source (AFB) TRICUSPID VALVE VEGETATION  Final    Comment: Performed at [facility], [address] [place], [place] [address]  Fungus Culture Result     Status: None   Collection Time: 05/10/20  9:10 AM  Result Value Ref Range Status   Result 1 Comment  Final    Comment: (NOTE) KOH/Calcofluor preparation:  no fungus observed. Performed At: [facility] [address] [place], [place] [id number] Jolene Schimke MD KV:[id number]   Culture, respiratory (non-expectorated)     Status: None   Collection Time: 05/13/20  6:05 PM   Specimen: Tracheal Aspirate; Respiratory  Result Value Ref Range Status   Specimen Description TRACHEAL ASPIRATE  Final   Special Requests NONE  Final   Gram Stain   Final    NO WBC SEEN FEW GRAM POSITIVE COCCI Performed at [facility], [address], [place], [place] [address]    Culture FEW METHICILLIN RESISTANT STAPHYLOCOCCUS AUREUS  Final   Report Status 05/15/2020 FINAL  Final   Organism ID, Bacteria METHICILLIN RESISTANT STAPHYLOCOCCUS AUREUS  Final      Susceptibility   Methicillin resistant staphylococcus aureus - MIC*    CIPROFLOXACIN <=0.5 SENSITIVE Sensitive     ERYTHROMYCIN >=8 RESISTANT Resistant     GENTAMICIN <=0.5 SENSITIVE Sensitive     OXACILLIN >=4 RESISTANT Resistant     TETRACYCLINE <=1 SENSITIVE Sensitive     VANCOMYCIN <=0.5 SENSITIVE Sensitive     TRIMETH/SULFA <=10 SENSITIVE Sensitive     CLINDAMYCIN <=0.25 SENSITIVE Sensitive     RIFAMPIN <=0.5 SENSITIVE Sensitive     Inducible Clindamycin NEGATIVE Sensitive     * FEW METHICILLIN RESISTANT STAPHYLOCOCCUS AUREUS  Culture, blood (routine x 2)     Status: Abnormal (Preliminary result)   Collection Time:  05/13/20  6:21 PM   Specimen: BLOOD  Result Value Ref Range Status   Specimen Description BLOOD LEFT ANTECUBITAL  Final   Special Requests   Final    BOTTLES DRAWN AEROBIC AND ANAEROBIC Blood Culture adequate volume   Culture  Setup Time   Final  GRAM POSITIVE COCCI IN BOTH AEROBIC AND ANAEROBIC BOTTLES CRITICAL VALUE NOTED.  VALUE IS CONSISTENT WITH PREVIOUSLY REPORTED AND CALLED VALUE.    Culture (A)  Final    STAPHYLOCOCCUS AUREUS SUSCEPTIBILITIES PERFORMED ON PREVIOUS CULTURE WITHIN THE LAST 5 DAYS. Sent to [facility] for further susceptibility testing. Performed at [facility], [address], [place], [place] [address]    Report Status PENDING  Incomplete  Culture, blood (routine x 2)     Status: Abnormal (Preliminary result)   Collection Time: 05/13/20  6:30 PM   Specimen: BLOOD LEFT FOREARM  Result Value Ref Range Status   Specimen Description BLOOD LEFT FOREARM  Final   Special Requests   Final    BOTTLES DRAWN AEROBIC AND ANAEROBIC Blood Culture adequate volume   Culture  Setup Time   Final    GRAM POSITIVE COCCI IN CLUSTERS ANAEROBIC BOTTLE ONLY CRITICAL RESULT CALLED TO, READ BACK BY AND VERIFIED WITH: Eduardo Osier MACCIA [id number] AT 1441 BY CM Performed at [facility], [address], [place], [place] [address]    Culture METHICILLIN RESISTANT STAPHYLOCOCCUS AUREUS (A)  Final   Report Status PENDING  Incomplete   Organism ID, Bacteria METHICILLIN RESISTANT STAPHYLOCOCCUS AUREUS  Final      Susceptibility   Methicillin resistant staphylococcus aureus - MIC*    CIPROFLOXACIN <=0.5 SENSITIVE Sensitive     ERYTHROMYCIN >=8 RESISTANT Resistant     GENTAMICIN <=0.5 SENSITIVE Sensitive     OXACILLIN >=4 RESISTANT Resistant     TETRACYCLINE <=1 SENSITIVE Sensitive     VANCOMYCIN 2 SENSITIVE Sensitive     TRIMETH/SULFA <=10 SENSITIVE Sensitive     CLINDAMYCIN <=0.25 SENSITIVE Sensitive     RIFAMPIN <=0.5 SENSITIVE Sensitive     Inducible Clindamycin NEGATIVE  Sensitive     * METHICILLIN RESISTANT STAPHYLOCOCCUS AUREUS  MRSA PCR Screening     Status: Abnormal   Collection Time: 05/13/20  7:45 PM   Specimen: Nasal Mucosa; Nasopharyngeal  Result Value Ref Range Status   MRSA by PCR POSITIVE (A) NEGATIVE Final    Comment:        The GeneXpert MRSA Assay (FDA approved for NASAL specimens only), is one component of a comprehensive MRSA colonization surveillance program. It is not intended to diagnose MRSA infection nor to guide or monitor treatment for MRSA infections. RESULT CALLED TO, READ BACK BY AND VERIFIED WITH: Jarome Matin RN 05/13/20 2117 JDW Performed at [facility], [address] [place], [place] [address]   Culture, blood (routine x 2)     Status: None (Preliminary result)   Collection Time: 05/17/20  9:42 AM   Specimen: BLOOD LEFT HAND  Result Value Ref Range Status   Specimen Description BLOOD LEFT HAND  Final   Special Requests   Final    BOTTLES DRAWN AEROBIC ONLY Blood Culture adequate volume   Culture   Final    NO GROWTH 2 DAYS Performed at [facility], [address] [place], [place] [address]    Report Status PENDING  Incomplete  Culture, blood (routine x 2)     Status: None (Preliminary result)   Collection Time: 05/17/20  9:55 AM   Specimen: BLOOD LEFT HAND  Result Value Ref Range Status   Specimen Description BLOOD LEFT HAND  Final   Special Requests   Final    BOTTLES DRAWN AEROBIC ONLY Blood Culture adequate volume   Culture   Final    NO GROWTH  2 DAYS Performed at [facility], [address] [place], [place] [address]    Report Status PENDING  Incomplete     Medications:    Chlorhexidine Gluconate Cloth  6 each Topical Daily   docusate sodium  100 mg Oral BID   enoxaparin (LOVENOX) injection  40 mg Subcutaneous Q24H   mouth rinse  15 mL Mouth Rinse BID   pantoprazole  40 mg Oral Daily   polyethylene glycol  17 g Oral Daily   potassium chloride  40 mEq Oral Q12H    sacubitril-valsartan  1 tablet Oral BID   sodium chloride flush  3 mL Intravenous Q12H   spironolactone  25 mg Oral Daily   Continuous Infusions:  sodium chloride Stopped (05/18/20 1552)   ceFTAROline (TEFLARO) IV 600 mg (05/20/20 9629)   DAPTOmycin (CUBICIN)  IV 500 mg (05/19/20 2220)      LOS: 22 days   Marinda Elk  [facility]  05/20/2020, 8:01 AM

## 2020-05-20 NOTE — Progress Notes (Signed)
Advanced Heart Failure Rounding Note  PCP-Cardiologist: No primary care provider on file.    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 ER w/u showed multiple septic emboli to lungs with cavitary lesions. 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  Also found to have emboli to kidneys and hands. TEE with EF 60-65% small PFO. large TV vegetation with severe TR. No vegetations on Aov/MV despite left-sided emboli.  Underwent AngioJet removal of part of vegetation. Then developed MRSA sepsis and respiratory failure. Intubated and started on pressors. F/u echo LVEF 20-25%. RV normal.  hstrop 192 -> 190.     Subjective:    Much improved. Extubated 7/28. Transferred out of ICU yesterday.   AF. No dyspnea. Volume status improved/ ok. BP stable.   Repeat limited echo yesterday showed slight improvement in LVEF, now 30-35% (up from 20-25%).    Limited Echo 7/29  1. Left ventricular ejection fraction, by estimation, is 30 to 35%. The left ventricle has moderately decreased function. The left ventricle demonstrates global hypokinesis. There is mild left ventricular hypertrophy. 2. Right ventricular systolic function is normal. The right ventricular size is normal. There is normal pulmonary artery systolic pressure. The estimated right ventricular systolic pressure is 30.5 mmHg. 3. The mitral valve is normal in structure. Trivial mitral valve regurgitation. 4. Tricuspid valve vegetations are seen, measuring up to 1.8 x 0.8 cm attached to posterior leaflet and up to 1.8 x 1.0 cm attached to anterior leaflet. The tricuspid valve is abnormal. Tricuspid valve regurgitation is severe. 5. The aortic valve is tricuspid. Aortic valve regurgitation is not visualized. No aortic stenosis is present. 6. Small pericardial effusion 7. The inferior vena cava is normal in size with <50% respiratory variability, suggesting right atrial pressure of 8  mmHg.  Objective:   Weight Range: 58.7 kg Body mass index is 20.27 kg/m.   Vital Signs:   Temp:  [98.1 F (36.7 C)-100.2 F (37.9 C)] 98.2 F (36.8 C) (07/30 0731) Pulse Rate:  [103-122] 116 (07/29 1704) Resp:  [13-40] 20 (07/30 0300) BP: (105-144)/(68-114) 105/85 (07/30 0300) SpO2:  [94 %-98 %] 97 % (07/30 0300) Weight:  [58.7 kg] 58.7 kg (07/30 0333) Last BM Date: 05/07/20  Weight change: Filed Weights   05/18/20 0530 05/19/20 0435 05/20/20 0333  Weight: 63.9 kg 54.6 kg 58.7 kg    Intake/Output:   Intake/Output Summary (Last 24 hours) at 05/20/2020 1024 Last data filed at 05/20/2020 0858 Gross per 24 hour  Intake 1661.98 ml  Output 3600 ml  Net -1938.02 ml      Physical Exam    General:  Young WM sitting up in bed. No distress.  HEENT: normal Neck: supple. JVP not elevated.  . Carotids 2+ bilat; no bruits. No lymphadenopathy or thryomegaly appreciated. Cor: PMI nondisplaced. Regular rhythm, tachy rate. 2/6 TR murmur  Lungs: CTAB Abdomen: soft, nontender, nondistended. No hepatosplenomegaly. No bruits or masses. Good bowel sounds. Extremities: no cyanosis, clubbing, rash, no edema Neuro: alert & orientedx3, cranial nerves grossly intact. moves all 4 extremities w/o difficulty. Affect pleasant   Telemetry   Sinus tach, low 100s  Personally reviewed   Labs    CBC Recent Labs    05/19/20 0231 05/20/20 0835  WBC 13.5* 15.1*  HGB 8.1* 10.7*  HCT 25.6* 34.2*  MCV 87.1 87.0  PLT 240 274   Basic Metabolic Panel Recent Labs    81/44/81 0344 05/18/20 0344 05/19/20  0231 05/20/20 0835  NA 141   < > 139 139  K 3.8   < > 3.4* 4.1  CL 100   < > 99 106  CO2 32   < > 31 24  GLUCOSE 114*   < > 122* 136*  BUN 16   < > 10 8  CREATININE 0.51*   < > 0.57* 0.63  CALCIUM 8.2*   < > 8.0* 8.2*  MG 1.9  --   --  2.0   < > = values in this interval not displayed.   Liver Function Tests Recent Labs    05/19/20 0231 05/20/20 0835  AST 18 21  ALT 18 21   ALKPHOS 126 112  BILITOT 0.7 0.5  PROT 6.4* 6.9  ALBUMIN 1.8* 1.9*   No results for input(s): LIPASE, AMYLASE in the last 72 hours. Cardiac Enzymes Recent Labs    05/18/20 1344  CKTOTAL 30*    BNP: BNP (last 3 results) No results for input(s): BNP in the last 8760 hours.  ProBNP (last 3 results) No results for input(s): PROBNP in the last 8760 hours.   D-Dimer No results for input(s): DDIMER in the last 72 hours. Hemoglobin A1C No results for input(s): HGBA1C in the last 72 hours. Fasting Lipid Panel Recent Labs    05/18/20 0344  TRIG 170*   Thyroid Function Tests No results for input(s): TSH, T4TOTAL, T3FREE, THYROIDAB in the last 72 hours.  Invalid input(s): FREET3  Other results:   Imaging    ECHOCARDIOGRAM LIMITED  Result Date: 05/19/2020    ECHOCARDIOGRAM LIMITED REPORT   Patient Name:   Clifford Clark Date of Exam: 05/19/2020 Medical Rec #:  [id number]     Height:       67.0 in Accession #:    [id number]    Weight:       120.4 lb Date of Birth:  03/08/1991     BSA:          1.630 m Patient Age:    29 years      BP:           129/86 mmHg Patient Gender: M             HR:           96 bpm. Exam Location:  Inpatient Procedure: Limited Echo, Cardiac Doppler, Color Doppler and 3D Echo Indications:    I50.21 Acute systolic (congestive) heart failure  History:        Patient has prior history of Echocardiogram examinations, most                 recent 05/14/2020. Abnormal ECG, Endocarditis;                 Signs/Symptoms:Chest Pain. Septic emboli. IVDU. Respiratory                 failure.  Sonographer:    Sheralyn Boatmanina West RDCS Referring Phys: [id number] Roxy HorsemanBRITTAINY M SIMMONS  Sonographer Comments: Technically difficult study due to poor echo windows. This is a limited study. IMPRESSIONS  1. Left ventricular ejection fraction, by estimation, is 30 to 35%. The left ventricle has moderately decreased function. The left ventricle demonstrates global hypokinesis. There is mild left ventricular  hypertrophy.  2. Right ventricular systolic function is normal. The right ventricular size is normal. There is normal pulmonary artery systolic pressure. The estimated right ventricular systolic pressure is 30.5 mmHg.  3. The mitral valve is normal in structure. Trivial  mitral valve regurgitation.  4. Tricuspid valve vegetations are seen, measuring up to 1.8 x 0.8 cm attached to posterior leaflet and up to 1.8 x 1.0 cm attached to anterior leaflet. The tricuspid valve is abnormal. Tricuspid valve regurgitation is severe.  5. The aortic valve is tricuspid. Aortic valve regurgitation is not visualized. No aortic stenosis is present.  6. Small pericardial effusion  7. The inferior vena cava is normal in size with <50% respiratory variability, suggesting right atrial pressure of 8 mmHg. FINDINGS  Left Ventricle: Left ventricular ejection fraction, by estimation, is 30 to 35%. The left ventricle has moderately decreased function. The left ventricle demonstrates global hypokinesis. The left ventricular internal cavity size was normal in size. There is mild left ventricular hypertrophy. Right Ventricle: The right ventricular size is normal. Right ventricular systolic function is normal. There is normal pulmonary artery systolic pressure. The tricuspid regurgitant velocity is 2.37 m/s, and with an assumed right atrial pressure of 8 mmHg,  the estimated right ventricular systolic pressure is 30.5 mmHg. Pericardium: A small pericardial effusion is present. Mitral Valve: The mitral valve is normal in structure. Trivial mitral valve regurgitation. Tricuspid Valve: Tricuspid valve vegetations are seen, measuring up to 1.8 x 0.8 cm attached to posterior leaflet and up to 1.8 x 1.0 cm attached to anterior leaflet. The tricuspid valve is abnormal. Tricuspid valve regurgitation is severe. Aortic Valve: The aortic valve is tricuspid. Aortic valve regurgitation is not visualized. No aortic stenosis is present. Aorta: The aortic root  is normal in size and structure. Venous: The inferior vena cava is normal in size with less than 50% respiratory variability, suggesting right atrial pressure of 8 mmHg. LEFT VENTRICLE PLAX 2D LVIDd:         4.70 cm     Diastology LVIDs:         3.90 cm     LV e' lateral:   10.73 cm/s LV PW:         1.40 cm     LV E/e' lateral: 4.2 LV IVS:        0.90 cm     LV e' medial:    8.05 cm/s                            LV E/e' medial:  5.6  LV Volumes (MOD) LV vol d, MOD A4C: 91.5 ml LV vol s, MOD A4C: 51.4 ml LV SV MOD A4C:     91.5 ml RIGHT VENTRICLE             IVC RV S prime:     16.50 cm/s  IVC diam: 1.90 cm LEFT ATRIUM         Index LA diam:    3.40 cm 2.09 cm/m   AORTA Ao Root diam: 3.70 cm MITRAL VALVE               TRICUSPID VALVE MV Area (PHT): 4.21 cm    TR Peak grad:   22.5 mmHg MV Decel Time: 180 msec    TR Vmax:        237.00 cm/s MV E velocity: 45.40 cm/s MV A velocity: 72.00 cm/s MV E/A ratio:  0.63 Epifanio Lesches MD Electronically signed by Epifanio Lesches MD Signature Date/Time: 05/19/2020/3:54:47 PM    Final      Medications:     Scheduled Medications:  Chlorhexidine Gluconate Cloth  6 each Topical Daily   docusate sodium  100  mg Oral BID   enoxaparin (LOVENOX) injection  40 mg Subcutaneous Q24H   feeding supplement (ENSURE ENLIVE)  237 mL Oral TID BM   mouth rinse  15 mL Mouth Rinse BID   multivitamin with minerals  1 tablet Oral Daily   pantoprazole  40 mg Oral Daily   polyethylene glycol  17 g Oral Daily   potassium chloride  40 mEq Oral Q12H   sacubitril-valsartan  1 tablet Oral BID   sodium chloride flush  3 mL Intravenous Q12H   spironolactone  25 mg Oral Daily    Infusions:  sodium chloride Stopped (05/18/20 1552)   ceFTAROline (TEFLARO) IV 600 mg (05/20/20 6546)   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 There was also amall flickering leaflet mass, suggestive of vegetation of the pulmonic valve. No mitral or aortic valve pathology. - s/p Angiovac debulking on 7/20 but still with large vegetation  - abx per ID. Now off vancomycin, now on daptomycin and ceftaroline   - AF - Will eventually need TV repair if he is a candidate. Will need to stop IVDU  2. Acute Hypoxic Respiratory Failure - Resolved. Extubated 7/28   3. Septic Shock  - 2/2 MRSA Bacteremia - resolved  - AF  - continue abx per ID  4. Acute Systolic Heart Failure - initial Echo 7/9 w/ normal LVEF 50-55%. RV normal - TEE 7/16, LVEF normal 60-65%, RV normal - Limited Echo 7/24, LVEF severely reduced 20-25%, RV systolic function hyperdynamic. MV w/ mild-mod MR but no seen vegetation  - Hs trop 192 -> 190. Trend flat, most c/w demand ischemia and not ACS - suspect acute drop in EF stress induced from septic shock. Doubt CAD - Repeat limited echo 7/29 w/ interval improvement, LVEF up to 30-35% - Continue Entresto 24-26 mg bid  - Continue spiro 25 mg.  - Volume status stable. May need PRN lasix at d/c   5. Anemia - improving, Hgb 10.7 today  - transfuse for hgb <7.0   6. Hypokalemia - resolved. K 4.1 today     Length of Stay: 22  Arvilla Meres, MD  05/20/2020, 10:24 AM  Advanced Heart Failure Team Pager [phone] (M-F; 7a - 4p)  Please contact [facility] for night-coverage after hours (4p -7a ) and weekends on [email or web address]  Patient seen and examined with the above-signed Advanced Practice Provider and/or Housestaff. I personally reviewed laboratory data, imaging studies and relevant notes. I independently examined  the patient and formulated the important aspects of the plan. I have edited the note to reflect any of my changes or salient points. I have personally discussed the plan with the patient and/or family.  Patient seen and examined with the above-signed Advanced Practice Provider and/or Housestaff. I personally reviewed laboratory data, imaging studies and relevant notes. I independently examined the patient and formulated the important aspects of the plan. I have edited the note to reflect any of my changes or salient points. I have personally discussed the plan with the patient and/or family.  Much improved today. Feels better. Volume status stable. Repeat echo 30-35%. Fluid  status looks good. On IV abx  General:  Lying in bed No resp difficulty HEENT: normal Neck: supple. no JVD. Carotids 2+ bilat; no bruits. No lymphadenopathy or thryomegaly appreciated. Cor: PMI nondisplaced. Regular rate & rhythm. 2/6 TR Lungs: clear Abdomen: soft, nontender, nondistended. No hepatosplenomegaly. No bruits or masses. Good bowel sounds. Extremities: no cyanosis, clubbing, rash, edema Neuro: alert & orientedx3, cranial nerves grossly intact. moves all 4 extremities w/o difficulty. Affect pleasant  Stable from HF perspective EF improving. I d/w ID and TCTS teams. They will arrange for home abx. He says he is committed to drug rehab. Will need 3 random negative drug screens prior to TVR repair.   HF meds  Entresto 24/26 bid Spiro 25 mg daily  Carvedilol 3.125 bid  Lasix 40 mg and k20 prn only  We will sign off. Will arrange HF f/u.   Arvilla Meres, MD  10:28 AM

## 2020-05-20 NOTE — Progress Notes (Signed)
[facility]  Date of Admission:  04/28/2020     Total days of antibiotics 22         ASSESSMENT:  Mr. Housholder blood cultures from 7/27 are without growth to date and post line cultures are pending. Central line removed. Overall doing better today. He has plans to go to rehabilitation to help with his substance abuse. Discussed importance of receiving antibiotics and likely need for valve replacement in the future which per CVTS he needs to be off IV drugs. Will continue current dose of Daptomycin and Ceftaroline. Current plan is continue both antibiotics for two weeks from culture clearance and then narrow ideally to Daptomycin.  Awaiting sensitivities to Daptomycin.  PLAN:  1. Continue current dose of daptomycin and ceftraoline. 2. Monitor cultures for clearance of bacteremia. 3. Monitor CK levels while on daptomycin per pharmacy protocol.   Dr. Drue Second will be available as needed over the weekend for ID related questions. Dr. Daiva Eves will see back on Monday.   Principal Problem:   Endocarditis Active Problems:   Intravenous drug abuse (HCC)   Septic embolism (HCC)   Sepsis (HCC)   Thrombocytopenia (HCC)   Hypokalemia   Hyponatremia   MRSA bacteremia   Malnutrition of moderate degree   Threatening behavior   Hand abscess   Elevated BUN   Pressure injury of skin   Chest pain   Acute respiratory failure (HCC)   . Chlorhexidine Gluconate Cloth  6 each Topical Daily  . docusate sodium  100 mg Oral BID  . enoxaparin (LOVENOX) injection  40 mg Subcutaneous Q24H  . mouth rinse  15 mL Mouth Rinse BID  . pantoprazole  40 mg Oral Daily  . polyethylene glycol  17 g Oral Daily  . potassium chloride  40 mEq Oral Q12H  . sacubitril-valsartan  1 tablet Oral BID  . sodium chloride flush  3 mL Intravenous Q12H  . spironolactone  25 mg Oral Daily    SUBJECTIVE:  Mildly elevated level of 100.2 overnight. No acute events overnight and now out of the  ICU.  Feeling better today and breathing okay.   No Known Allergies   Review of Systems: Review of Systems  Constitutional: Negative for chills, fever and weight loss.  Respiratory: Negative for cough, shortness of breath and wheezing.   Cardiovascular: Negative for chest pain and leg swelling.  Gastrointestinal: Negative for abdominal pain, constipation, diarrhea, nausea and vomiting.  Skin: Negative for rash.      OBJECTIVE: Vitals:   05/20/20 0000 05/20/20 0300 05/20/20 0333 05/20/20 0731  BP:  105/85    Pulse:      Resp: 20 20    Temp: 99.1 F (37.3 C) 98.1 F (36.7 C)  98.2 F (36.8 C)  TempSrc: Oral Oral  Oral  SpO2: 98% 97%    Weight:   58.7 kg   Height:       Body mass index is 20.27 kg/m.  Physical Exam Constitutional:      General: He is not in acute distress.    Appearance: He is well-developed.  Cardiovascular:     Rate and Rhythm: Normal rate and regular rhythm.     Heart sounds: Murmur heard.   Pulmonary:     Effort: Pulmonary effort is normal.     Breath sounds: Normal breath sounds.  Skin:    General: Skin is warm and dry.  Neurological:     Mental Status: He is alert and oriented  to person, place, and time.  Psychiatric:        Mood and Affect: Mood normal.     Lab Results Lab Results  Component Value Date   WBC 13.5 (H) 05/19/2020   HGB 8.1 (L) 05/19/2020   HCT 25.6 (L) 05/19/2020   MCV 87.1 05/19/2020   PLT 240 05/19/2020    Lab Results  Component Value Date   CREATININE 0.57 (L) 05/19/2020   BUN 10 05/19/2020   NA 139 05/19/2020   K 3.4 (L) 05/19/2020   CL 99 05/19/2020   CO2 31 05/19/2020    Lab Results  Component Value Date   ALT 18 05/19/2020   AST 18 05/19/2020   ALKPHOS 126 05/19/2020   BILITOT 0.7 05/19/2020     Microbiology: Recent Results (from the past 240 hour(s))  Fungus Culture With Stain     Status: None (Preliminary result)   Collection Time: 05/10/20  9:10 AM   Specimen: Soft Tissue, Other  Result  Value Ref Range Status   Fungus Stain Final report  Final    Comment: (NOTE) Performed At: [facility] [address] [place], [place] [id number] Jolene Schimke MD OH:[id number]    Fungus (Mycology) Culture PENDING  Incomplete   Fungal Source TRICUSPID VALVE VEGETATION  Final    Comment: Performed at [facility], [address], [place], [place] [address]  Aerobic/Anaerobic Culture (surgical/deep wound)     Status: None   Collection Time: 05/10/20  9:10 AM   Specimen: Soft Tissue, Other  Result Value Ref Range Status   Specimen Description TISSUE  Final   Special Requests TRICUSPID VALVE VEGETATION  Final   Gram Stain   Final    RARE WBC PRESENT, PREDOMINANTLY PMN ABUNDANT GRAM POSITIVE COCCI IN PAIRS IN CLUSTERS    Culture   Final    ABUNDANT METHICILLIN RESISTANT STAPHYLOCOCCUS AUREUS NO ANAEROBES ISOLATED Performed at [facility], [address], [place], [place] [address]    Report Status 05/15/2020 FINAL  Final   Organism ID, Bacteria METHICILLIN RESISTANT STAPHYLOCOCCUS AUREUS  Final      Susceptibility   Methicillin resistant staphylococcus aureus - MIC*    CIPROFLOXACIN <=0.5 SENSITIVE Sensitive     ERYTHROMYCIN >=8 RESISTANT Resistant     GENTAMICIN <=0.5 SENSITIVE Sensitive     OXACILLIN >=4 RESISTANT Resistant     TETRACYCLINE <=1 SENSITIVE Sensitive     VANCOMYCIN 1 SENSITIVE Sensitive     TRIMETH/SULFA <=10 SENSITIVE Sensitive     CLINDAMYCIN <=0.25 SENSITIVE Sensitive     RIFAMPIN <=0.5 SENSITIVE Sensitive     Inducible Clindamycin NEGATIVE Sensitive     * ABUNDANT METHICILLIN RESISTANT STAPHYLOCOCCUS AUREUS  Acid Fast Smear (AFB)     Status: None   Collection Time: 05/10/20  9:10 AM   Specimen: Soft Tissue, Other  Result Value Ref Range Status   AFB Specimen Processing Concentration  Final   Acid Fast Smear Negative  Final    Comment: (NOTE) Performed At: [facility] [address] [place], [place] [id number] Jolene Schimke MD YF:[id number]    Source (AFB) TRICUSPID VALVE VEGETATION  Final    Comment: Performed at [facility], [address], [place], [place] [address]  Fungus Culture Result     Status: None   Collection Time: 05/10/20  9:10 AM  Result Value Ref Range Status   Result 1 Comment  Final    Comment: (NOTE) KOH/Calcofluor preparation:  no fungus observed. Performed  At: [facility] [address] [place], [place] [id number] Jolene SchimkeNagendra Sanjai MD WU:[phone]   Culture, respiratory (non-expectorated)     Status: None   Collection Time: 05/13/20  6:05 PM   Specimen: Tracheal Aspirate; Respiratory  Result Value Ref Range Status   Specimen Description TRACHEAL ASPIRATE  Final   Special Requests NONE  Final   Gram Stain   Final    NO WBC SEEN FEW GRAM POSITIVE COCCI Performed at [facility], [address] [place], [place] [address]    Culture FEW METHICILLIN RESISTANT STAPHYLOCOCCUS AUREUS  Final   Report Status 05/15/2020 FINAL  Final   Organism ID, Bacteria METHICILLIN RESISTANT STAPHYLOCOCCUS AUREUS  Final      Susceptibility   Methicillin resistant staphylococcus aureus - MIC*    CIPROFLOXACIN <=0.5 SENSITIVE Sensitive     ERYTHROMYCIN >=8 RESISTANT Resistant     GENTAMICIN <=0.5 SENSITIVE Sensitive     OXACILLIN >=4 RESISTANT Resistant     TETRACYCLINE <=1 SENSITIVE Sensitive     VANCOMYCIN <=0.5 SENSITIVE Sensitive     TRIMETH/SULFA <=10 SENSITIVE Sensitive     CLINDAMYCIN <=0.25 SENSITIVE Sensitive     RIFAMPIN <=0.5 SENSITIVE Sensitive     Inducible Clindamycin NEGATIVE Sensitive     * FEW METHICILLIN RESISTANT STAPHYLOCOCCUS AUREUS  Culture, blood (routine x 2)     Status: Abnormal (Preliminary result)   Collection Time: 05/13/20  6:21 PM   Specimen: BLOOD  Result Value Ref Range Status   Specimen Description BLOOD LEFT ANTECUBITAL  Final   Special Requests   Final    BOTTLES DRAWN AEROBIC AND ANAEROBIC Blood Culture adequate volume   Culture   Setup Time   Final    GRAM POSITIVE COCCI IN BOTH AEROBIC AND ANAEROBIC BOTTLES CRITICAL VALUE NOTED.  VALUE IS CONSISTENT WITH PREVIOUSLY REPORTED AND CALLED VALUE.    Culture (A)  Final    STAPHYLOCOCCUS AUREUS SUSCEPTIBILITIES PERFORMED ON PREVIOUS CULTURE WITHIN THE LAST 5 DAYS. Sent to [facility] for further susceptibility testing. Performed at [facility], [address] [place], [place] [address]    Report Status PENDING  Incomplete  Culture, blood (routine x 2)     Status: Abnormal (Preliminary result)   Collection Time: 05/13/20  6:30 PM   Specimen: BLOOD LEFT FOREARM  Result Value Ref Range Status   Specimen Description BLOOD LEFT FOREARM  Final   Special Requests   Final    BOTTLES DRAWN AEROBIC AND ANAEROBIC Blood Culture adequate volume   Culture  Setup Time   Final    GRAM POSITIVE COCCI IN CLUSTERS ANAEROBIC BOTTLE ONLY CRITICAL RESULT CALLED TO, READ BACK BY AND VERIFIED WITH: Eduardo OsierHARMD M MACCIA [id number] AT 1441 BY CM Performed at [facility], [address] [place], [place] [address]    Culture METHICILLIN RESISTANT STAPHYLOCOCCUS AUREUS (A)  Final   Report Status PENDING  Incomplete   Organism ID, Bacteria METHICILLIN RESISTANT STAPHYLOCOCCUS AUREUS  Final      Susceptibility   Methicillin resistant staphylococcus aureus - MIC*    CIPROFLOXACIN <=0.5 SENSITIVE Sensitive     ERYTHROMYCIN >=8 RESISTANT Resistant     GENTAMICIN <=0.5 SENSITIVE Sensitive     OXACILLIN >=4 RESISTANT Resistant     TETRACYCLINE <=1 SENSITIVE Sensitive     VANCOMYCIN 2 SENSITIVE Sensitive     TRIMETH/SULFA <=10 SENSITIVE Sensitive     CLINDAMYCIN <=0.25 SENSITIVE Sensitive     RIFAMPIN <=0.5 SENSITIVE Sensitive     Inducible  Clindamycin NEGATIVE Sensitive     * METHICILLIN RESISTANT STAPHYLOCOCCUS AUREUS  MRSA PCR Screening     Status: Abnormal   Collection Time: 05/13/20  7:45 PM   Specimen: Nasal Mucosa; Nasopharyngeal  Result Value Ref Range Status   MRSA by PCR  POSITIVE (A) NEGATIVE Final    Comment:        The GeneXpert MRSA Assay (FDA approved for NASAL specimens only), is one component of a comprehensive MRSA colonization surveillance program. It is not intended to diagnose MRSA infection nor to guide or monitor treatment for MRSA infections. RESULT CALLED TO, READ BACK BY AND VERIFIED WITH: Jarome Matin RN 05/13/20 2117 JDW Performed at [facility], [address], [place], [place] [address]   Culture, blood (routine x 2)     Status: None (Preliminary result)   Collection Time: 05/17/20  9:42 AM   Specimen: BLOOD LEFT HAND  Result Value Ref Range Status   Specimen Description BLOOD LEFT HAND  Final   Special Requests   Final    BOTTLES DRAWN AEROBIC ONLY Blood Culture adequate volume   Culture   Final    NO GROWTH 2 DAYS Performed at [facility], [address] [place], [place] [address]    Report Status PENDING  Incomplete  Culture, blood (routine x 2)     Status: None (Preliminary result)   Collection Time: 05/17/20  9:55 AM   Specimen: BLOOD LEFT HAND  Result Value Ref Range Status   Specimen Description BLOOD LEFT HAND  Final   Special Requests   Final    BOTTLES DRAWN AEROBIC ONLY Blood Culture adequate volume   Culture   Final    NO GROWTH 2 DAYS Performed at [facility], [address] [place], [place] [address]    Report Status PENDING  Incomplete     Marcos Eke, NP [facility]  05/20/2020  8:24 AM

## 2020-05-20 NOTE — Plan of Care (Signed)
  Problem: Clinical Measurements: Goal: Respiratory complications will improve Outcome: Progressing   Problem: Activity: Goal: Risk for activity intolerance will decrease Outcome: Progressing   Problem: Safety: Goal: Ability to remain free from injury will improve Outcome: Progressing   

## 2020-05-20 NOTE — Progress Notes (Deleted)
Received pt. From ED, alert and oriented. With on going chestpain 5/10. Heparin drip infusing @ 11.54ml and Nitro drip @ 52ml/hr. Prep for CaCath done.

## 2020-05-20 NOTE — Progress Notes (Addendum)
Advanced Heart Failure Rounding Note  PCP-Cardiologist: No primary care provider on file.    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 ER w/u showed multiple septic emboli to lungs with cavitary lesions. 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  Also found to have emboli to kidneys and hands. TEE with EF 60-65% small PFO. large TV vegetation with severe TR. No vegetations on Aov/MV despite left-sided emboli.  Underwent AngioJet removal of part of vegetation. Then developed MRSA sepsis and respiratory failure. Intubated and started on pressors. F/u echo LVEF 20-25%. RV normal.  hstrop 192 -> 190.     Subjective:    Much improved. Extubated 7/28. Transferred out of ICU yesterday.   AF. No dyspnea. Volume status improved/ ok. BP stable.   Repeat limited echo yesterday showed slight improvement in LVEF, now 30-35% (up from 20-25%).    Limited Echo 7/29  1. Left ventricular ejection fraction, by estimation, is 30 to 35%. The left ventricle has moderately decreased function. The left ventricle demonstrates global hypokinesis. There is mild left ventricular hypertrophy. 2. Right ventricular systolic function is normal. The right ventricular size is normal. There is normal pulmonary artery systolic pressure. The estimated right ventricular systolic pressure is 30.5 mmHg. 3. The mitral valve is normal in structure. Trivial mitral valve regurgitation. 4. Tricuspid valve vegetations are seen, measuring up to 1.8 x 0.8 cm attached to posterior leaflet and up to 1.8 x 1.0 cm attached to anterior leaflet. The tricuspid valve is abnormal. Tricuspid valve regurgitation is severe. 5. The aortic valve is tricuspid. Aortic valve regurgitation is not visualized. No aortic stenosis is present. 6. Small pericardial effusion 7. The inferior vena cava is normal in size with <50% respiratory variability, suggesting right atrial pressure of 8  mmHg.  Objective:   Weight Range: 58.7 kg Body mass index is 20.27 kg/m.   Vital Signs:   Temp:  [98.1 F (36.7 C)-100.2 F (37.9 C)] 98.2 F (36.8 C) (07/30 0731) Pulse Rate:  [103-122] 116 (07/29 1704) Resp:  [13-40] 20 (07/30 0300) BP: (105-144)/(68-114) 105/85 (07/30 0300) SpO2:  [94 %-98 %] 97 % (07/30 0300) Weight:  [58.7 kg] 58.7 kg (07/30 0333) Last BM Date: 05/07/20  Weight change: Filed Weights   05/18/20 0530 05/19/20 0435 05/20/20 0333  Weight: 63.9 kg 54.6 kg 58.7 kg    Intake/Output:   Intake/Output Summary (Last 24 hours) at 05/20/2020 1008 Last data filed at 05/20/2020 0858 Gross per 24 hour  Intake 1661.98 ml  Output 3600 ml  Net -1938.02 ml      Physical Exam    General:  Young WM sitting up in bed. No distress.  HEENT: normal Neck: supple. JVP not elevated.  . Carotids 2+ bilat; no bruits. No lymphadenopathy or thryomegaly appreciated. Cor: PMI nondisplaced. Regular rhythm, tachy rate. 2/6 TR murmur  Lungs: CTAB Abdomen: soft, nontender, nondistended. No hepatosplenomegaly. No bruits or masses. Good bowel sounds. Extremities: no cyanosis, clubbing, rash, no edema Neuro: alert & orientedx3, cranial nerves grossly intact. moves all 4 extremities w/o difficulty. Affect pleasant   Telemetry   Sinus tach, low 100s  Personally reviewed   Labs    CBC Recent Labs    05/19/20 0231 05/20/20 0835  WBC 13.5* 15.1*  HGB 8.1* 10.7*  HCT 25.6* 34.2*  MCV 87.1 87.0  PLT 240 274   Basic Metabolic Panel Recent Labs    16/07/9606/28/21 0344 05/18/20 0344 05/19/20  0231 05/20/20 0835  NA 141   < > 139 139  K 3.8   < > 3.4* 4.1  CL 100   < > 99 106  CO2 32   < > 31 24  GLUCOSE 114*   < > 122* 136*  BUN 16   < > 10 8  CREATININE 0.51*   < > 0.57* 0.63  CALCIUM 8.2*   < > 8.0* 8.2*  MG 1.9  --   --  2.0   < > = values in this interval not displayed.   Liver Function Tests Recent Labs    05/19/20 0231 05/20/20 0835  AST 18 21  ALT 18 21   ALKPHOS 126 112  BILITOT 0.7 0.5  PROT 6.4* 6.9  ALBUMIN 1.8* 1.9*   No results for input(s): LIPASE, AMYLASE in the last 72 hours. Cardiac Enzymes Recent Labs    05/18/20 1344  CKTOTAL 30*    BNP: BNP (last 3 results) No results for input(s): BNP in the last 8760 hours.  ProBNP (last 3 results) No results for input(s): PROBNP in the last 8760 hours.   D-Dimer No results for input(s): DDIMER in the last 72 hours. Hemoglobin A1C No results for input(s): HGBA1C in the last 72 hours. Fasting Lipid Panel Recent Labs    05/18/20 0344  TRIG 170*   Thyroid Function Tests No results for input(s): TSH, T4TOTAL, T3FREE, THYROIDAB in the last 72 hours.  Invalid input(s): FREET3  Other results:   Imaging    ECHOCARDIOGRAM LIMITED  Result Date: 05/19/2020    ECHOCARDIOGRAM LIMITED REPORT   Patient Name:   JASMOND RIVER Date of Exam: 05/19/2020 Medical Rec #:  [id number]     Height:       67.0 in Accession #:    [id number]    Weight:       120.4 lb Date of Birth:  1991/03/28     BSA:          1.630 m Patient Age:    29 years      BP:           129/86 mmHg Patient Gender: M             HR:           96 bpm. Exam Location:  Inpatient Procedure: Limited Echo, Cardiac Doppler, Color Doppler and 3D Echo Indications:    I50.21 Acute systolic (congestive) heart failure  History:        Patient has prior history of Echocardiogram examinations, most                 recent 05/14/2020. Abnormal ECG, Endocarditis;                 Signs/Symptoms:Chest Pain. Septic emboli. IVDU. Respiratory                 failure.  Sonographer:    Sheralyn Boatman RDCS Referring Phys: 61 Roxy Horseman SIMMONS  Sonographer Comments: Technically difficult study due to poor echo windows. This is a limited study. IMPRESSIONS  1. Left ventricular ejection fraction, by estimation, is 30 to 35%. The left ventricle has moderately decreased function. The left ventricle demonstrates global hypokinesis. There is mild left ventricular  hypertrophy.  2. Right ventricular systolic function is normal. The right ventricular size is normal. There is normal pulmonary artery systolic pressure. The estimated right ventricular systolic pressure is 30.5 mmHg.  3. The mitral valve is normal in structure. Trivial  mitral valve regurgitation.  4. Tricuspid valve vegetations are seen, measuring up to 1.8 x 0.8 cm attached to posterior leaflet and up to 1.8 x 1.0 cm attached to anterior leaflet. The tricuspid valve is abnormal. Tricuspid valve regurgitation is severe.  5. The aortic valve is tricuspid. Aortic valve regurgitation is not visualized. No aortic stenosis is present.  6. Small pericardial effusion  7. The inferior vena cava is normal in size with <50% respiratory variability, suggesting right atrial pressure of 8 mmHg. FINDINGS  Left Ventricle: Left ventricular ejection fraction, by estimation, is 30 to 35%. The left ventricle has moderately decreased function. The left ventricle demonstrates global hypokinesis. The left ventricular internal cavity size was normal in size. There is mild left ventricular hypertrophy. Right Ventricle: The right ventricular size is normal. Right ventricular systolic function is normal. There is normal pulmonary artery systolic pressure. The tricuspid regurgitant velocity is 2.37 m/s, and with an assumed right atrial pressure of 8 mmHg,  the estimated right ventricular systolic pressure is 30.5 mmHg. Pericardium: A small pericardial effusion is present. Mitral Valve: The mitral valve is normal in structure. Trivial mitral valve regurgitation. Tricuspid Valve: Tricuspid valve vegetations are seen, measuring up to 1.8 x 0.8 cm attached to posterior leaflet and up to 1.8 x 1.0 cm attached to anterior leaflet. The tricuspid valve is abnormal. Tricuspid valve regurgitation is severe. Aortic Valve: The aortic valve is tricuspid. Aortic valve regurgitation is not visualized. No aortic stenosis is present. Aorta: The aortic root  is normal in size and structure. Venous: The inferior vena cava is normal in size with less than 50% respiratory variability, suggesting right atrial pressure of 8 mmHg. LEFT VENTRICLE PLAX 2D LVIDd:         4.70 cm     Diastology LVIDs:         3.90 cm     LV e' lateral:   10.73 cm/s LV PW:         1.40 cm     LV E/e' lateral: 4.2 LV IVS:        0.90 cm     LV e' medial:    8.05 cm/s                            LV E/e' medial:  5.6  LV Volumes (MOD) LV vol d, MOD A4C: 91.5 ml LV vol s, MOD A4C: 51.4 ml LV SV MOD A4C:     91.5 ml RIGHT VENTRICLE             IVC RV S prime:     16.50 cm/s  IVC diam: 1.90 cm LEFT ATRIUM         Index LA diam:    3.40 cm 2.09 cm/m   AORTA Ao Root diam: 3.70 cm MITRAL VALVE               TRICUSPID VALVE MV Area (PHT): 4.21 cm    TR Peak grad:   22.5 mmHg MV Decel Time: 180 msec    TR Vmax:        237.00 cm/s MV E velocity: 45.40 cm/s MV A velocity: 72.00 cm/s MV E/A ratio:  0.63 Epifanio Lesches MD Electronically signed by Epifanio Lesches MD Signature Date/Time: 05/19/2020/3:54:47 PM    Final      Medications:     Scheduled Medications: . Chlorhexidine Gluconate Cloth  6 each Topical Daily  . docusate sodium  100  mg Oral BID  . enoxaparin (LOVENOX) injection  40 mg Subcutaneous Q24H  . feeding supplement (ENSURE ENLIVE)  237 mL Oral TID BM  . mouth rinse  15 mL Mouth Rinse BID  . multivitamin with minerals  1 tablet Oral Daily  . pantoprazole  40 mg Oral Daily  . polyethylene glycol  17 g Oral Daily  . potassium chloride  40 mEq Oral Q12H  . sacubitril-valsartan  1 tablet Oral BID  . sodium chloride flush  3 mL Intravenous Q12H  . spironolactone  25 mg Oral Daily    Infusions: . sodium chloride Stopped (05/18/20 1552)  . ceFTAROline (TEFLARO) IV 600 mg (05/20/20 1219)  . 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 There was also amall flickering leaflet mass, suggestive of vegetation of the pulmonic valve. No mitral or aortic valve pathology. - s/p Angiovac debulking on 7/20 but still with large vegetation  - abx per ID. Now off vancomycin, now on daptomycin and ceftaroline   - AF - Will eventually need TV repair if he is a candidate. Will need to stop IVDU  2. Acute Hypoxic Respiratory Failure - Resolved. Extubated 7/28   3. Septic Shock  - 2/2 MRSA Bacteremia - resolved  - AF  - continue abx per ID  4. Acute Systolic Heart Failure - initial Echo 7/9 w/ normal LVEF 50-55%. RV normal - TEE 7/16, LVEF normal 60-65%, RV normal - Limited Echo 7/24, LVEF severely reduced 20-25%, RV systolic function hyperdynamic. MV w/ mild-mod MR but no seen vegetation  - Hs trop 192 -> 190. Trend flat, most c/w demand ischemia and not ACS - suspect acute drop in EF stress induced from septic shock. Doubt CAD - Repeat limited echo 7/29 w/ interval improvement, LVEF up to 30-35% - Continue Entresto 24-26 mg bid  - Continue spiro 25 mg.  - Volume status stable. May need PRN lasix at d/c   5. Anemia - improving, Hgb 10.7 today  - transfuse for hgb <7.0   6. Hypokalemia - resolved. K 4.1 today     Length of Stay: [address], PA-C  05/20/2020, 10:08 AM  Advanced Heart Failure Team Pager [phone] (M-F; 7a - 4p)  Please contact [facility] for night-coverage after hours (4p -7a ) and weekends on [email or web address]  Patient seen and examined with the above-signed Advanced Practice Provider and/or Housestaff. I personally reviewed laboratory data, imaging studies and relevant notes. I independently  examined the patient and formulated the important aspects of the plan. I have edited the note to reflect any of my changes or salient points. I have personally discussed the plan with the patient and/or family.   Agree. See my note for earlier today.   Arvilla Meres, MD  11:39 AM

## 2020-05-20 NOTE — Plan of Care (Signed)
  Problem: Activity: Goal: Risk for activity intolerance will decrease Outcome: Progressing   Problem: Coping: Goal: Level of anxiety will decrease Outcome: Progressing   Problem: Pain Managment: Goal: General experience of comfort will improve Outcome: Progressing   

## 2020-05-20 NOTE — Progress Notes (Signed)
Nutrition Follow-up  RD working remotely.  DOCUMENTATION CODES:   Non-severe (moderate) malnutrition in context of social or environmental circumstances  INTERVENTION:   -MVI with minerals daily -Ensure Enlive po TID, each supplement provides 350 kcal and 20 grams of protein -Magic cup TID with meals, each supplement provides 290 kcal and 9 grams of protein  NUTRITION DIAGNOSIS:   Moderate Malnutrition related to social / environmental circumstances as evidenced by energy intake < 75% for > or equal to 3 months, moderate fat depletion, moderate muscle depletion, percent weight loss.  Ongoing  GOAL:   Patient will meet greater than or equal to 90% of their needs  Unmet  MONITOR:   PO intake, Supplement acceptance, Labs, Weight trends  REASON FOR ASSESSMENT:   Consult Assessment of nutrition requirement/status  ASSESSMENT:   Pt admitted for multiple septic emboli suspicious for possible endocarditis in the setting of IV drug use resulting in sepsis. PMH significant for polysubstance abuse.  7/13 - s/p I&D of bilateral hand abscesses 7/16 - s/p TEE confirming TV vegetation and trace pulmonic valve vegetation 7/20 - s/p angiovac debridement of tricuspid valve 7/23- intubated  7/28- extubated 7/29- advanced to Heart Healthy diet  Reviewed I/O's: -1.6 L x 24 hours and -14.3 L since 05/06/20  UOP: 3.2 L x 24 hours  Attempted to speak with pt via hospital room phone, however, no answer.   Per MD notes, pt may require tricuspid valve repair if he is candidate. He   Pt with poor oral intake. Noted meal completion 10%. Noted last documented BM on 05/07/20; pt on bowel regimen.   Medications reviewed and include colace, lovenox, miralax, and aldactone.   Labs reviewed: CBGS: 99-127 (inpatient orders for glycemic control are none).   Diet Order:   Diet Order            Diet Heart Room service appropriate? Yes; Fluid consistency: Thin  Diet effective now                  EDUCATION NEEDS:   Not appropriate for education at this time  Skin:  Skin Assessment: Skin Integrity Issues: Skin Integrity Issues:: Incisions Stage II: - Incisions: lt arm, rt arm, rt neck, rt groin  Last BM:  05/07/20  Height:   Ht Readings from Last 1 Encounters:  05/13/20 5\' 7"  (1.702 m)    Weight:   Wt Readings from Last 1 Encounters:  05/20/20 58.7 kg   BMI:  Body mass index is 20.27 kg/m.  Estimated Nutritional Needs:   Kcal:  1850-2050  Protein:  105-120 grams  Fluid:  > 1.8 L    05/22/20, RD, LDN, CDCES Registered Dietitian II Certified Diabetes Care and Education Specialist Please refer to [facility] for RD and/or RD on-call/weekend/after hours pager

## 2020-05-21 LAB — CBC
HCT: 34.5 % — ABNORMAL LOW (ref 39.0–52.0)
Hemoglobin: 10.9 g/dL — ABNORMAL LOW (ref 13.0–17.0)
MCH: 27 pg (ref 26.0–34.0)
MCHC: 31.6 g/dL (ref 30.0–36.0)
MCV: 85.6 fL (ref 80.0–100.0)
Platelets: 283 10*3/uL (ref 150–400)
RBC: 4.03 MIL/uL — ABNORMAL LOW (ref 4.22–5.81)
RDW: 15.4 % (ref 11.5–15.5)
WBC: 18.5 10*3/uL — ABNORMAL HIGH (ref 4.0–10.5)
nRBC: 0 % (ref 0.0–0.2)

## 2020-05-21 LAB — COMPREHENSIVE METABOLIC PANEL
ALT: 30 U/L (ref 0–44)
AST: 26 U/L (ref 15–41)
Albumin: 2.1 g/dL — ABNORMAL LOW (ref 3.5–5.0)
Alkaline Phosphatase: 98 U/L (ref 38–126)
Anion gap: 9 (ref 5–15)
BUN: 10 mg/dL (ref 6–20)
CO2: 22 mmol/L (ref 22–32)
Calcium: 8.4 mg/dL — ABNORMAL LOW (ref 8.9–10.3)
Chloride: 104 mmol/L (ref 98–111)
Creatinine, Ser: 0.69 mg/dL (ref 0.61–1.24)
GFR calc Af Amer: >60 mL/min (ref >60)
GFR calc non Af Amer: >60 mL/min (ref >60)
Glucose, Bld: 110 mg/dL — ABNORMAL HIGH (ref 70–99)
Potassium: 4.2 mmol/L (ref 3.5–5.1)
Sodium: 135 mmol/L (ref 135–145)
Total Bilirubin: 0.6 mg/dL (ref 0.3–1.2)
Total Protein: 7.1 g/dL (ref 6.5–8.1)

## 2020-05-21 LAB — GLUCOSE, CAPILLARY
Glucose-Capillary: 112 mg/dL — ABNORMAL HIGH (ref 70–99)
Glucose-Capillary: 112 mg/dL — ABNORMAL HIGH (ref 70–99)
Glucose-Capillary: 117 mg/dL — ABNORMAL HIGH (ref 70–99)
Glucose-Capillary: 151 mg/dL — ABNORMAL HIGH (ref 70–99)
Glucose-Capillary: 91 mg/dL (ref 70–99)

## 2020-05-21 NOTE — Progress Notes (Signed)
   05/21/20 1929  Assess: MEWS Score  Temp 98.6 F (37 C)  BP 109/76  Pulse Rate (!) 109  ECG Heart Rate (!) 114  Resp (!) 24  SpO2 97 %  Assess: MEWS Score  MEWS Temp 0  MEWS Systolic 0  MEWS Pulse 2  MEWS RR 1  MEWS LOC 0  MEWS Score 3  MEWS Score Color Yellow  Assess: if the MEWS score is Yellow or Red  Were vital signs taken at a resting state? Yes  Focused Assessment No change from prior assessment  Early Detection of Sepsis Score *See Row Information* Low  MEWS guidelines implemented *See Row Information* No, previously yellow, continue vital signs every 4 hours  Treat  MEWS Interventions Administered scheduled meds/treatments  Document  Progress note created (see row info) Yes  monitored.

## 2020-05-21 NOTE — Progress Notes (Signed)
Pt. Refusing to have vitals taken and morning labs.

## 2020-05-21 NOTE — Progress Notes (Signed)
[facility] PROGRESS NOTE    Progress Note  Clifford Clark  ZOX:[id number] DOB: Jun 26, 1991 DOA: 04/28/2020 PCP: Patient, [name]     Brief Narrative:   Clifford HancockJake Liptak is an 29 y.o. 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  2D echo showed an EF of 60% with a PFO and a large vegetation in the tricuspid valve.  He underwent AngioJet removal of part of the vegetation,repeat a 2D echo showed an EF of 20%.  Extubated on 05/19/2020.  05/03/2020 I&D of abscess.  Grew MSSA. 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  Next  05/11/2020 upper extremity Doppler showed no DVT.  05/13/2020 CTA of the chest without PE multifocal and septic emboli.  With multiple cavitary lesions consolidation and anasarca.  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  04/28/2020 blood cultures grew MRSA repeated blood cultures from 05/13/2020 + for MRSA, repeated blood cultures on 05/17/2020 are negative till date 05/03/2020 fungal cultures remain negative till date.  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 Status post debulking with angio vac on 05/10/2020 due to large vegetation. ID is on board and appreciate assistance. Repeated 2D echo was on 05/19/2020 that showed an EF of 30%, with persistent tricuspid vegetation measuring 1.8 x 0.8 cm-to the posterior leaflet.  With severe tricuspid regurgitation. Need tricuspid valve repair if he is a candidate.  Acute respiratory failure with hypoxia: Extubated on 05/18/2020.  Septic shock requiring pressors: Secondary to MRSA bacteremia on admission started off pressors weaned off pressors and transfer to progressive unit on 05/20/2020. Continue current antibiotic regimen will need at least 6 weeks of antibiotics. To recheck CK levels intermittently.  Acute systolic heart failure question septic cardiomyopathy: Initial 2D echo on admission showed an EF of 60%, repeat a 2D echo on 05/14/2020 showed a reduced EF of 20%. Suspect drop in EF is due to septic shock. Started having brisk diuresis and is now negative about 14 L,, Lasix was stopped to continue to diurese he is about 16 L negative.  Continue Coreg, Entresto continue Aldactone.  Normocytic anemia: Hemoglobin is now 10 appears to be stable. Signs of overt bleeding.  In the setting of infectious etiology.  HypoKalemia: Resolved with oral repletion and Aldactone.  Substance abuse disorder: Patient admits to opiates and cocaine abuse. Will need counseling.  Bilateral wrist abscess: Orthopedic surgery was consulted and he  was I&D on 05/03/2020 cultures grew MSSA. Suture removed by orthopedic.  Malnutrition of moderate  degree Ensure 3 times daily.  DVT prophylaxis: lovenox Family Communication:none Status is: Inpatient  Remains inpatient appropriate because:Hemodynamically unstable   Dispo: The patient is from: Home              Anticipated d/c is to: Home              Anticipated d/c date is: > 3 days              Patient currently is not medically stable to d/c.  Patient will need to finish his antibiotic regimen in house will be a total of 6 weeks.     Code Status:     Code Status Orders  (From admission, onward)         Start     Ordered   04/28/20 2123  Full code  Continuous        04/28/20 2122        Code Status History    Date Active Date Inactive Code Status Order ID Comments User Context   01/24/2019 1806 01/28/2019 1001 Full Code [id number]  Oneta Rack, NP Inpatient   01/24/2019 1451 01/24/2019 1752 Full Code [id number]  Raeford Razor, MD ED   01/25/2013 1812 01/26/2013 1256 Full Code [id number]  Desmond Dike ED   Advance Care Planning Activity        IV Access:    Peripheral IV   Procedures and diagnostic studies:   ECHOCARDIOGRAM LIMITED  Result Date: 05/19/2020    ECHOCARDIOGRAM LIMITED REPORT   Patient Name:   Clifford Clark Date of Exam: 05/19/2020 Medical Rec #:  [id number]     Height:       67.0 in Accession #:    [id number]    Weight:       120.4 lb Date of Birth:  Nov 02, 1990     BSA:          1.630 m Patient Age:    29 years      BP:           129/86 mmHg Patient Gender: M             HR:           96 bpm. Exam Location:  Inpatient Procedure: Limited Echo, Cardiac Doppler, Color Doppler and 3D Echo Indications:    I50.21 Acute systolic (congestive) heart failure  History:        Patient has prior history of Echocardiogram examinations, most                 recent 05/14/2020. Abnormal ECG, Endocarditis;                 Signs/Symptoms:Chest Pain. Septic emboli. IVDU. Respiratory                 failure.  Sonographer:    Sheralyn Boatman RDCS Referring Phys: 40 Roxy Horseman  SIMMONS  Sonographer Comments: Technically difficult study due to poor echo windows. This is a limited study. IMPRESSIONS  1. Left ventricular ejection fraction, by estimation, is 30 to 35%. The left ventricle has moderately decreased function. The left ventricle demonstrates global hypokinesis. There is mild left ventricular hypertrophy.  2. Right ventricular systolic function is normal. The right ventricular size is normal. There is normal pulmonary artery systolic pressure. The estimated right ventricular systolic pressure is 30.5 mmHg.  3. The mitral valve is normal  in structure. Trivial mitral valve regurgitation.  4. Tricuspid valve vegetations are seen, measuring up to 1.8 x 0.8 cm attached to posterior leaflet and up to 1.8 x 1.0 cm attached to anterior leaflet. The tricuspid valve is abnormal. Tricuspid valve regurgitation is severe.  5. The aortic valve is tricuspid. Aortic valve regurgitation is not visualized. No aortic stenosis is present.  6. Small pericardial effusion  7. The inferior vena cava is normal in size with <50% respiratory variability, suggesting right atrial pressure of 8 mmHg. FINDINGS  Left Ventricle: Left ventricular ejection fraction, by estimation, is 30 to 35%. The left ventricle has moderately decreased function. The left ventricle demonstrates global hypokinesis. The left ventricular internal cavity size was normal in size. There is mild left ventricular hypertrophy. Right Ventricle: The right ventricular size is normal. Right ventricular systolic function is normal. There is normal pulmonary artery systolic pressure. The tricuspid regurgitant velocity is 2.37 m/s, and with an assumed right atrial pressure of 8 mmHg,  the estimated right ventricular systolic pressure is 30.5 mmHg. Pericardium: A small pericardial effusion is present. Mitral Valve: The mitral valve is normal in structure. Trivial mitral valve regurgitation. Tricuspid Valve: Tricuspid valve vegetations are seen,  measuring up to 1.8 x 0.8 cm attached to posterior leaflet and up to 1.8 x 1.0 cm attached to anterior leaflet. The tricuspid valve is abnormal. Tricuspid valve regurgitation is severe. Aortic Valve: The aortic valve is tricuspid. Aortic valve regurgitation is not visualized. No aortic stenosis is present. Aorta: The aortic root is normal in size and structure. Venous: The inferior vena cava is normal in size with less than 50% respiratory variability, suggesting right atrial pressure of 8 mmHg. LEFT VENTRICLE PLAX 2D LVIDd:         4.70 cm     Diastology LVIDs:         3.90 cm     LV e' lateral:   10.73 cm/s LV PW:         1.40 cm     LV E/e' lateral: 4.2 LV IVS:        0.90 cm     LV e' medial:    8.05 cm/s                            LV E/e' medial:  5.6  LV Volumes (MOD) LV vol d, MOD A4C: 91.5 ml LV vol s, MOD A4C: 51.4 ml LV SV MOD A4C:     91.5 ml RIGHT VENTRICLE             IVC RV S prime:     16.50 cm/s  IVC diam: 1.90 cm LEFT ATRIUM         Index LA diam:    3.40 cm 2.09 cm/m   AORTA Ao Root diam: 3.70 cm MITRAL VALVE               TRICUSPID VALVE MV Area (PHT): 4.21 cm    TR Peak grad:   22.5 mmHg MV Decel Time: 180 msec    TR Vmax:        237.00 cm/s MV E velocity: 45.40 cm/s MV A velocity: 72.00 cm/s MV E/A ratio:  0.63 Epifanio Lesches MD Electronically signed by Epifanio Lesches MD Signature Date/Time: 05/19/2020/3:54:47 PM    Final      Medical Consultants:    None.  Anti-Infectives:   Currently on daptomycin and Teflaro  Subjective:  Firas Mastrangelo no signs of withdrawal no complaints.  Objective:    Vitals:   05/20/20 2200 05/21/20 0000 05/21/20 0400 05/21/20 0749  BP: 112/76 105/76 102/74   Pulse:  (!) 110    Resp:  (!) 27    Temp:  98.4 F (36.9 C)  98.6 F (37 C)  TempSrc:  Oral  Oral  SpO2:      Weight:  53.5 kg    Height:       SpO2: 97 % O2 Flow Rate (L/min): 2 L/min FiO2 (%): 40 %   Intake/Output Summary (Last 24 hours) at 05/21/2020 0951 Last  data filed at 05/21/2020 0500 Gross per 24 hour  Intake 1695.9 ml  Output 1950 ml  Net -254.1 ml   Filed Weights   05/19/20 0435 05/20/20 0333 05/21/20 0000  Weight: 54.6 kg 58.7 kg 53.5 kg    Exam: General exam: In no acute distress. Respiratory system: Good air movement and clear to auscultation. Cardiovascular system: S1 & S2 heard, RRR. No JVD. Gastrointestinal system: Abdomen is nondistended, soft and nontender.  Extremities: No pedal edema. Skin: No rashes, lesions or ulcers Psychiatry: Judgement and insight appear normal. Mood & affect appropriate.   Data Reviewed:    Labs: Basic Metabolic Panel: Recent Labs  Lab 05/14/20 1617 05/14/20 1617 05/15/20 0414 05/15/20 1714 05/16/20 0405 05/17/20 0425 05/17/20 0425 05/18/20 5852 05/18/20 0344 05/19/20 0231 05/19/20 0231 05/20/20 0835 05/21/20 0733  NA 137   < > 139  --    < > 142  --  141  --  139  --  139 135  K 3.6   < > 3.6  --    < > 3.6   < > 3.8   < > 3.4*   < > 4.1 4.2  CL 104   < > 106  --    < > 108  --  100  --  99  --  106 104  CO2 25   < > 24  --    < > 26  --  32  --  31  --  24 22  GLUCOSE 120*   < > 136*  --    < > 115*  --  114*  --  122*  --  136* 110*  BUN 19   < > 21*  --    < > 15  --  16  --  10  --  8 10  CREATININE 0.64   < > 0.59*  --    < > 0.58*  --  0.51*  --  0.57*  --  0.63 0.69  CALCIUM 7.1*   < > 7.3*  --    < > 7.9*  --  8.2*  --  8.0*  --  8.2* 8.4*  MG 2.0  --  2.1 2.1  --   --   --  1.9  --   --   --  2.0  --   PHOS 3.8  --  3.0 2.3*  --   --   --   --   --   --   --   --   --    < > = values in this interval not displayed.   GFR Estimated Creatinine Clearance: 102.2 mL/min (by C-G formula based on SCr of 0.69 mg/dL). Liver Function Tests: Recent Labs  Lab 05/17/20 0425 05/18/20 0344 05/19/20 0231 05/20/20 0835 05/21/20 0733  AST 21 16 18 21  26  ALT 24 21 18 21 30   ALKPHOS 150* 149* 126 112 98  BILITOT 0.9 0.7 0.7 0.5 0.6  PROT 5.7* 6.4* 6.4* 6.9 7.1  ALBUMIN 1.5*  1.7* 1.8* 1.9* 2.1*   No results for input(s): LIPASE, AMYLASE in the last 168 hours. No results for input(s): AMMONIA in the last 168 hours. Coagulation profile Recent Labs  Lab 05/15/20 0414  INR 1.4*   COVID-19 Labs  No results for input(s): DDIMER, FERRITIN, LDH, CRP in the last 72 hours.  Lab Results  Component Value Date   SARSCOV2NAA NEGATIVE 04/28/2020    CBC: Recent Labs  Lab 05/17/20 0425 05/18/20 0344 05/19/20 0231 05/20/20 0835 05/21/20 0733  WBC 10.5 13.1* 13.5* 15.1* 18.5*  HGB 7.2* 7.7* 8.1* 10.7* 10.9*  HCT 23.5* 24.7* 25.6* 34.2* 34.5*  MCV 92.5 90.1 87.1 87.0 85.6  PLT 137* 202 240 274 283   Cardiac Enzymes: Recent Labs  Lab 05/18/20 1344  CKTOTAL 30*   BNP (last 3 results) No results for input(s): PROBNP in the last 8760 hours. CBG: Recent Labs  Lab 05/20/20 1131 05/20/20 1607 05/20/20 2008 05/21/20 0034 05/21/20 0731  GLUCAP 100* 107* 135* 112* 117*   D-Dimer: No results for input(s): DDIMER in the last 72 hours. Hgb A1c: No results for input(s): HGBA1C in the last 72 hours. Lipid Profile: No results for input(s): CHOL, HDL, LDLCALC, TRIG, CHOLHDL, LDLDIRECT in the last 72 hours. Thyroid function studies: No results for input(s): TSH, T4TOTAL, T3FREE, THYROIDAB in the last 72 hours.  Invalid input(s): FREET3 Anemia work up: No results for input(s): VITAMINB12, FOLATE, FERRITIN, TIBC, IRON, RETICCTPCT in the last 72 hours. Sepsis Labs: Recent Labs  Lab 05/18/20 0344 05/19/20 0231 05/20/20 0835 05/21/20 0733  WBC 13.1* 13.5* 15.1* 18.5*   Microbiology Recent Results (from the past 240 hour(s))  Culture, respiratory (non-expectorated)     Status: None   Collection Time: 05/13/20  6:05 PM   Specimen: Tracheal Aspirate; Respiratory  Result Value Ref Range Status   Specimen Description TRACHEAL ASPIRATE  Final   Special Requests NONE  Final   Gram Stain   Final    NO WBC SEEN FEW GRAM POSITIVE COCCI Performed at [facility], [address] [place], [place] [address]    Culture FEW METHICILLIN RESISTANT STAPHYLOCOCCUS AUREUS  Final   Report Status 05/15/2020 FINAL  Final   Organism ID, Bacteria METHICILLIN RESISTANT STAPHYLOCOCCUS AUREUS  Final      Susceptibility   Methicillin resistant staphylococcus aureus - MIC*    CIPROFLOXACIN <=0.5 SENSITIVE Sensitive     ERYTHROMYCIN >=8 RESISTANT Resistant     GENTAMICIN <=0.5 SENSITIVE Sensitive     OXACILLIN >=4 RESISTANT Resistant     TETRACYCLINE <=1 SENSITIVE Sensitive     VANCOMYCIN <=0.5 SENSITIVE Sensitive     TRIMETH/SULFA <=10 SENSITIVE Sensitive     CLINDAMYCIN <=0.25 SENSITIVE Sensitive     RIFAMPIN <=0.5 SENSITIVE Sensitive     Inducible Clindamycin NEGATIVE Sensitive     * FEW METHICILLIN RESISTANT STAPHYLOCOCCUS AUREUS  Culture, blood (routine x 2)     Status: Abnormal (Preliminary result)   Collection Time: 05/13/20  6:21 PM   Specimen: BLOOD  Result Value Ref Range Status   Specimen Description BLOOD LEFT ANTECUBITAL  Final   Special Requests   Final    BOTTLES DRAWN AEROBIC AND ANAEROBIC Blood Culture adequate volume   Culture  Setup Time   Final    GRAM POSITIVE COCCI IN BOTH AEROBIC  AND ANAEROBIC BOTTLES CRITICAL VALUE NOTED.  VALUE IS CONSISTENT WITH PREVIOUSLY REPORTED AND CALLED VALUE.    Culture (A)  Final    STAPHYLOCOCCUS AUREUS SUSCEPTIBILITIES PERFORMED ON PREVIOUS CULTURE WITHIN THE LAST 5 DAYS. Sent to [facility] for further susceptibility testing. Performed at [facility], [address], [place], [place] [address]    Report Status PENDING  Incomplete  Culture, blood (routine x 2)     Status: Abnormal (Preliminary result)   Collection Time: 05/13/20  6:30 PM   Specimen: BLOOD LEFT FOREARM  Result Value Ref Range Status   Specimen Description BLOOD LEFT FOREARM  Final   Special Requests   Final    BOTTLES DRAWN AEROBIC AND ANAEROBIC Blood Culture adequate volume   Culture  Setup Time   Final    GRAM  POSITIVE COCCI IN CLUSTERS ANAEROBIC BOTTLE ONLY CRITICAL RESULT CALLED TO, READ BACK BY AND VERIFIED WITH: Eduardo Osier MACCIA [id number] AT 1441 BY CM Performed at [facility], [address], [place], [place] [address]    Culture METHICILLIN RESISTANT STAPHYLOCOCCUS AUREUS (A)  Final   Report Status PENDING  Incomplete   Organism ID, Bacteria METHICILLIN RESISTANT STAPHYLOCOCCUS AUREUS  Final      Susceptibility   Methicillin resistant staphylococcus aureus - MIC*    CIPROFLOXACIN <=0.5 SENSITIVE Sensitive     ERYTHROMYCIN >=8 RESISTANT Resistant     GENTAMICIN <=0.5 SENSITIVE Sensitive     OXACILLIN >=4 RESISTANT Resistant     TETRACYCLINE <=1 SENSITIVE Sensitive     VANCOMYCIN 2 SENSITIVE Sensitive     TRIMETH/SULFA <=10 SENSITIVE Sensitive     CLINDAMYCIN <=0.25 SENSITIVE Sensitive     RIFAMPIN <=0.5 SENSITIVE Sensitive     Inducible Clindamycin NEGATIVE Sensitive     * METHICILLIN RESISTANT STAPHYLOCOCCUS AUREUS  MRSA PCR Screening     Status: Abnormal   Collection Time: 05/13/20  7:45 PM   Specimen: Nasal Mucosa; Nasopharyngeal  Result Value Ref Range Status   MRSA by PCR POSITIVE (A) NEGATIVE Final    Comment:        The GeneXpert MRSA Assay (FDA approved for NASAL specimens only), is one component of a comprehensive MRSA colonization surveillance program. It is not intended to diagnose MRSA infection nor to guide or monitor treatment for MRSA infections. RESULT CALLED TO, READ BACK BY AND VERIFIED WITH: Jarome Matin RN 05/13/20 2117 JDW Performed at [facility], [address], [place], [place] [address]   Culture, blood (routine x 2)     Status: None (Preliminary result)   Collection Time: 05/17/20  9:42 AM   Specimen: BLOOD LEFT HAND  Result Value Ref Range Status   Specimen Description BLOOD LEFT HAND  Final   Special Requests   Final    BOTTLES DRAWN AEROBIC ONLY Blood Culture adequate volume   Culture   Final    NO GROWTH 4 DAYS Performed at [facility], [address] [place], [place] [address]    Report Status PENDING  Incomplete  Culture, blood (routine x 2)     Status: None (Preliminary result)   Collection Time: 05/17/20  9:55 AM   Specimen: BLOOD LEFT HAND  Result Value Ref Range Status   Specimen Description BLOOD LEFT HAND  Final   Special Requests   Final    BOTTLES DRAWN AEROBIC ONLY Blood Culture adequate volume   Culture   Final    NO GROWTH 4 DAYS Performed at [facility], [address], [place], [place] [address]    Report Status PENDING  Incomplete  Culture, blood (routine x 2)     Status: None (Preliminary result)   Collection Time: 05/19/20 11:37 AM   Specimen: BLOOD  Result Value Ref Range Status   Specimen Description BLOOD LEFT ANTECUBITAL  Final   Special Requests   Final    BOTTLES DRAWN AEROBIC AND ANAEROBIC Blood Culture adequate volume   Culture   Final    NO GROWTH 2 DAYS Performed at [facility], [address], [place], [place] [address]    Report Status PENDING  Incomplete  Culture, blood (routine x 2)     Status: None (Preliminary result)   Collection Time: 05/19/20 11:37 AM   Specimen: BLOOD  Result Value Ref Range Status   Specimen Description BLOOD BLOOD LEFT HAND  Final   Special Requests   Final    BOTTLES DRAWN AEROBIC AND ANAEROBIC Blood Culture adequate volume   Culture   Final    NO GROWTH 2 DAYS Performed at [facility], [address] [place], [place] [address]    Report Status PENDING  Incomplete     Medications:   . carvedilol  3.125 mg Oral BID WC  . Chlorhexidine Gluconate Cloth  6 each Topical Daily  . docusate sodium  100 mg Oral BID  . enoxaparin (LOVENOX) injection  40 mg Subcutaneous Q24H  . feeding supplement (ENSURE ENLIVE)  237 mL Oral TID BM  . mouth rinse  15 mL Mouth Rinse BID  . multivitamin with minerals  1 tablet Oral Daily  . pantoprazole  40 mg Oral Daily  . polyethylene glycol  17 g Oral Daily  . potassium chloride  40  mEq Oral Q12H  . sacubitril-valsartan  1 tablet Oral BID  . sodium chloride flush  3 mL Intravenous Q12H  . spironolactone  25 mg Oral Daily   Continuous Infusions: . sodium chloride Stopped (05/18/20 1552)  . ceFTAROline (TEFLARO) IV 600 mg (05/21/20 0659)  . DAPTOmycin (CUBICIN)  IV 500 mg (05/21/20 0021)      LOS: 23 days   Marinda Elk  [facility]  05/21/2020, 9:51 AM

## 2020-05-21 NOTE — Progress Notes (Signed)
This note also relates to the following rows which could not be included: BP - Cannot attach notes to unvalidated device data ECG Heart Rate - Cannot attach notes to unvalidated device data Resp - Cannot attach notes to unvalidated device data    05/21/20 0800  Assess: MEWS Score  Temp 98.6 F (37 C)  Level of Consciousness Alert  O2 Device Room Air  Assess: if the MEWS score is Yellow or Red  Were vital signs taken at a resting state? Yes  Focused Assessment No change from prior assessment  Early Detection of Sepsis Score *See Row Information* Low  MEWS guidelines implemented *See Row Information* No, other (Comment)  Chronic Yellow

## 2020-05-21 NOTE — Progress Notes (Signed)
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 s/p angiovac by CVTS 7/20. Intubated 7/23-7/28. PMHx: IVDU, asthma   OT comments  Pt seen for OT follow up session with focus on [facility] activities. Issued pt playing cards, theraputty, squeeze ball, and activity book with colored pencils to encourage [facility] engagement to improve intrinsic strength. Pt completed theraputty exercises and utilized squeeze ball for grip strength while seated EOB at bed side table. He states he enjoys playing cards, encouraged pt to continue practicing with cards for [facility] strength as well. He continues to show cognitive deficits with current situation, problem solving, etc. Will continue to follow pt to progress toward independence with hopeful d/c to inpatient drug rehab center.    Follow Up Recommendations  Home health OT;Supervision/Assistance - 24 hour    Equipment Recommendations  3 in 1 bedside commode    Recommendations for Other Services      Precautions / Restrictions Precautions Precautions: Fall Restrictions Weight Bearing Restrictions: No       Mobility Bed Mobility Overal bed mobility: Needs Assistance Bed Mobility: Supine to Sit;Sit to Supine     Supine to sit: Supervision Sit to supine: Supervision   General bed mobility comments: sitting EOB to engage in [facility] plan  Transfers                      Balance Overall balance assessment: Needs assistance;History of Falls Sitting-balance support: No upper extremity supported;Feet supported Sitting balance-Leahy Scale: Fair                                     ADL either performed or assessed with clinical judgement   ADL Overall ADL's : Needs assistance/impaired                                        General ADL Comments: session focused on [facility] HEP and activity program to improve intrinsic musculature     Vision Baseline Vision/History: No visual deficits     Perception     Praxis      Cognition Arousal/Alertness: Awake/alert Behavior During Therapy: Flat affect Overall Cognitive Status: Impaired/Different from baseline Area of Impairment: Orientation;Attention;Safety/judgement;Awareness;Problem solving                 Orientation Level: Disoriented to;Situation;Time Current Attention Level: Sustained     Safety/Judgement: Decreased awareness of safety;Decreased awareness of deficits Awareness: Emergent Problem Solving: Slow processing;Requires verbal cues General Comments: contuing to have difficulty orienting to situation. Needs increased cues for safety and processing        Exercises Hand Exercises Digit Composite Flexion: AROM;10 reps;Squeeze ball Hand Activities Cards - Deal: Right;5 reps;Seated Cards - Flip: Right;5 reps;Seated Other Exercises Other Exercises: issued pt theraputty with verbal instruction   Shoulder Instructions       General Comments      Pertinent Vitals/ Pain       Pain Assessment: Faces Faces Pain Scale: Hurts little more Pain Location: B hands; general discomfort Pain Descriptors / Indicators: Discomfort;Grimacing Pain Intervention(s): Monitored during session  Home Living  Prior Functioning/Environment              Frequency  Min 2X/week        Progress Toward Goals  OT Goals(current goals can now be found in the care plan section)  Progress towards OT goals: Progressing toward goals  Acute Rehab OT Goals Patient Stated Goal: return to independence/durg rehab OT Goal Formulation: With patient Time For Goal Achievement: 06/02/20 Potential to Achieve Goals: Good  Plan Discharge plan remains appropriate     Co-evaluation                 AM-PAC OT "6 Clicks" Daily Activity     Outcome Measure   Help from another person eating meals?: None Help from another person taking care of personal grooming?: A Little Help from another person toileting, which includes using toliet, bedpan, or urinal?: A Little Help from another person bathing (including washing, rinsing, drying)?: A Lot Help from another person to put on and taking off regular upper body clothing?: A Little Help from another person to put on and taking off regular lower body clothing?: A Lot 6 Click Score: 17    End of Session    OT Visit Diagnosis: Unsteadiness on feet (R26.81);Muscle weakness (generalized) (M62.81);History of falling (Z91.81);Other symptoms and signs involving cognitive function;Pain Pain - part of body: Hand   Activity Tolerance Patient tolerated treatment well   Patient Left in bed;with call bell/phone within reach;with bed alarm set   Nurse Communication Mobility status        Time: 3086-5784 OT Time Calculation (min): 11 min  Charges: OT General Charges $OT Visit: 1 Visit OT Treatments $Therapeutic Activity: 8-22 mins  Dalphine Handing, MSOT, OTR/L Acute Rehabilitation Services [facility] Office Number: [phone] Pager: [phone]  Dalphine Handing 05/21/2020, 5:15 PM

## 2020-05-22 LAB — MISC LABCORP TEST (SEND OUT): Labcorp test code: 96388

## 2020-05-22 LAB — GLUCOSE, CAPILLARY
Glucose-Capillary: 102 mg/dL — ABNORMAL HIGH (ref 70–99)
Glucose-Capillary: 112 mg/dL — ABNORMAL HIGH (ref 70–99)
Glucose-Capillary: 116 mg/dL — ABNORMAL HIGH (ref 70–99)
Glucose-Capillary: 125 mg/dL — ABNORMAL HIGH (ref 70–99)
Glucose-Capillary: 143 mg/dL — ABNORMAL HIGH (ref 70–99)
Glucose-Capillary: 145 mg/dL — ABNORMAL HIGH (ref 70–99)

## 2020-05-22 LAB — CBC
HCT: 34 % — ABNORMAL LOW (ref 39.0–52.0)
Hemoglobin: 10.9 g/dL — ABNORMAL LOW (ref 13.0–17.0)
MCH: 27.7 pg (ref 26.0–34.0)
MCHC: 32.1 g/dL (ref 30.0–36.0)
MCV: 86.3 fL (ref 80.0–100.0)
Platelets: 276 10*3/uL (ref 150–400)
RBC: 3.94 MIL/uL — ABNORMAL LOW (ref 4.22–5.81)
RDW: 15.5 % (ref 11.5–15.5)
WBC: 17.9 10*3/uL — ABNORMAL HIGH (ref 4.0–10.5)
nRBC: 0 % (ref 0.0–0.2)

## 2020-05-22 LAB — COMPREHENSIVE METABOLIC PANEL
ALT: 39 U/L (ref 0–44)
AST: 31 U/L (ref 15–41)
Albumin: 2.4 g/dL — ABNORMAL LOW (ref 3.5–5.0)
Alkaline Phosphatase: 104 U/L (ref 38–126)
Anion gap: 8 (ref 5–15)
BUN: 11 mg/dL (ref 6–20)
CO2: 25 mmol/L (ref 22–32)
Calcium: 9 mg/dL (ref 8.9–10.3)
Chloride: 104 mmol/L (ref 98–111)
Creatinine, Ser: 0.67 mg/dL (ref 0.61–1.24)
GFR calc Af Amer: >60 mL/min (ref >60)
GFR calc non Af Amer: >60 mL/min (ref >60)
Glucose, Bld: 102 mg/dL — ABNORMAL HIGH (ref 70–99)
Potassium: 4.3 mmol/L (ref 3.5–5.1)
Sodium: 137 mmol/L (ref 135–145)
Total Bilirubin: 0.5 mg/dL (ref 0.3–1.2)
Total Protein: 7.8 g/dL (ref 6.5–8.1)

## 2020-05-22 LAB — CULTURE, BLOOD (ROUTINE X 2)
Culture: NO GROWTH
Culture: NO GROWTH
Special Requests: ADEQUATE
Special Requests: ADEQUATE

## 2020-05-22 MED ORDER — TRAMADOL HCL 50 MG PO TABS
50.0000 mg | ORAL_TABLET | Freq: Four times a day (QID) | ORAL | Status: DC | PRN
  Administered 2020-05-23 – 2020-06-16 (×10): 50 mg via ORAL
  Filled 2020-05-22 (×12): qty 1

## 2020-05-22 NOTE — Progress Notes (Signed)
[facility] PROGRESS NOTE    Progress Note  Clifford Clark  ZOX:[id number] DOB: 09-19-1991 DOA: 04/28/2020 PCP: Patient, [name]     Brief Narrative:   Clifford Clark is an 29 y.o. 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  2D echo showed an EF of 60% with a PFO and a large vegetation in the tricuspid valve.  He underwent AngioJet removal of part of the vegetation,repeat a 2D echo showed an EF of 20%.  Extubated on 05/19/2020.  05/03/2020 I&D of abscess.  Grew MSSA. 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  Next  05/11/2020 upper extremity Doppler showed no DVT.  05/13/2020 CTA of the chest without PE multifocal and septic emboli.  With multiple cavitary lesions consolidation and anasarca.  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  04/28/2020 blood cultures grew MRSA repeated blood cultures from 05/13/2020 + for MRSA, repeated blood cultures on 05/17/2020 are negative till date 05/03/2020 fungal cultures remain negative till date.  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 Status post debulking with angio vac on 05/10/2020 due to large vegetation. ID is on board and appreciate assistance. Repeated 2D echo was on 05/19/2020 that showed an EF of 30%, with persistent tricuspid vegetation measuring 1.8 x 0.8 cm-to the posterior leaflet.  With severe tricuspid regurgitation. Continue IV empiric antibiotics.  Acute respiratory failure with hypoxia: Extubated on 05/18/2020.  Septic shock requiring pressors: Secondary to MRSA bacteremia on admission started off pressors weaned off pressors and transfer to progressive unit on 05/20/2020. Continue current antibiotic regimen will need at least 6 weeks of antibiotics. To recheck CK levels intermittently.  Acute systolic heart failure question septic cardiomyopathy: Initial 2D echo on admission showed an EF of 60%, repeat a 2D echo on 05/14/2020 showed a reduced EF of 20%. Suspect drop in EF is due to septic shock. Started having brisk diuresis and is now negative about 14 L. Continue to monitor blood pressure continue Coreg, Entresto and Aldactone.  Normocytic anemia: Hemoglobin is now 10 appears to be stable. Signs of overt bleeding.  In the setting of infectious etiology.  HypoKalemia: Resolved with oral repletion and Aldactone.  Substance abuse disorder: Patient admits to opiates and cocaine abuse. Will need counseling.  Bilateral wrist abscess: Orthopedic surgery was consulted and he was I&D on 05/03/2020 cultures grew MSSA. Suture removed by orthopedic.  Malnutrition of  moderate degree Ensure 3 times daily.  DVT prophylaxis: lovenox Family  Communication:none Status is: Inpatient  Remains inpatient appropriate because:Hemodynamically unstable   Dispo: The patient is from: Home              Anticipated d/c is to: Home              Anticipated d/c date is: > 3 days              Patient currently is not medically stable to d/c.  Patient will need to finish his antibiotic regimen in house will be a total of 6 weeks.     Code Status:     Code Status Orders  (From admission, onward)         Start     Ordered   04/28/20 2123  Full code  Continuous        04/28/20 2122        Code Status History    Date Active Date Inactive Code Status Order ID Comments User Context   01/24/2019 1806 01/28/2019 1001 Full Code [id number]  Oneta Rack, NP Inpatient   01/24/2019 1451 01/24/2019 1752 Full Code [id number]  Raeford Razor, MD ED   01/25/2013 1812 01/26/2013 1256 Full Code [id number]  Desmond Dike ED   Advance Care Planning Activity        IV Access:    Peripheral IV   Procedures and diagnostic studies:   No results found.   Medical Consultants:    None.  Anti-Infectives:   Currently on daptomycin and Teflaro  Subjective:    Clifford Clark complaining of pain in his head where they remove the stitches.  Objective:    Vitals:   05/22/20 0016 05/22/20 0400 05/22/20 0742 05/22/20 0803  BP: (!) 120/93 102/72    Pulse: (!) 110 (!) 113    Resp: 23 (!) 24  22  Temp: 98.9 F (37.2 C) 97.9 F (36.6 C) 98.7 F (37.1 C)   TempSrc: Oral Oral Oral   SpO2: 97% 97%    Weight:  52.6 kg    Height:       SpO2: 97 % O2 Flow Rate (L/min): 2 L/min FiO2 (%): 40 %   Intake/Output Summary (Last 24 hours) at 05/22/2020 1043 Last data filed at 05/22/2020 0743 Gross per 24 hour  Intake 820 ml  Output 2700 ml  Net -1880 ml   Filed Weights   05/20/20 0333 05/21/20 0000 05/22/20 0400  Weight: 58.7 kg 53.5 kg 52.6 kg    Exam: General exam: In no acute distress. Respiratory system: Good air movement and clear  to auscultation. Cardiovascular system: S1 & S2 heard, RRR. No JVD. Gastrointestinal system: Abdomen is nondistended, soft and nontender.  Extremities: No pedal edema. Skin: No rashes, lesions or ulcers Psychiatry: Judgement and insight appear normal. Mood & affect appropriate.   Data Reviewed:    Labs: Basic Metabolic Panel: Recent Labs  Lab 05/15/20 1714 05/16/20 0405 05/18/20 0344 05/18/20 0344 05/19/20 0231 05/19/20 0231 05/20/20 3967 05/20/20 2897 05/21/20 0733 05/22/20 0729  NA  --    < > 141  --  139  --  139  --  135 137  K  --    < > 3.8   < > 3.4*   < > 4.1   < > 4.2 4.3  CL  --    < > 100  --  99  --  106  --  104 104  CO2  --    < > 32  --  31  --  24  --  22 25  GLUCOSE  --    < > 114*  --  122*  --  136*  --  110* 102*  BUN  --    < > 16  --  10  --  8  --  10 11  CREATININE  --    < > 0.51*  --  0.57*  --  0.63  --  0.69 0.67  CALCIUM  --    < > 8.2*  --  8.0*  --  8.2*  --  8.4* 9.0  MG 2.1  --  1.9  --   --   --  2.0  --   --   --   PHOS 2.3*  --   --   --   --   --   --   --   --   --    < > = values in this interval not displayed.   GFR Estimated Creatinine Clearance: 100.5 mL/min (by C-G formula based on SCr of 0.67 mg/dL). Liver Function Tests: Recent Labs  Lab 05/18/20 0344 05/19/20 0231 05/20/20 0835 05/21/20 0733 05/22/20 0729  AST 16 18 21 26 31   ALT 21 18 21 30  39  ALKPHOS 149* 126 112 98 104  BILITOT 0.7 0.7 0.5 0.6 0.5  PROT 6.4* 6.4* 6.9 7.1 7.8  ALBUMIN 1.7* 1.8* 1.9* 2.1* 2.4*   No results for input(s): LIPASE, AMYLASE in the last 168 hours. No results for input(s): AMMONIA in the last 168 hours. Coagulation profile No results for input(s): INR, PROTIME in the last 168 hours. COVID-19 Labs  No results for input(s): DDIMER, FERRITIN, LDH, CRP in the last 72 hours.  Lab Results  Component Value Date   SARSCOV2NAA NEGATIVE 04/28/2020    CBC: Recent Labs  Lab 05/18/20 0344 05/19/20 0231 05/20/20 0835 05/21/20 0733  05/22/20 0729  WBC 13.1* 13.5* 15.1* 18.5* 17.9*  HGB 7.7* 8.1* 10.7* 10.9* 10.9*  HCT 24.7* 25.6* 34.2* 34.5* 34.0*  MCV 90.1 87.1 87.0 85.6 86.3  PLT 202 240 274 283 276   Cardiac Enzymes: Recent Labs  Lab 05/18/20 1344  CKTOTAL 30*   BNP (last 3 results) No results for input(s): PROBNP in the last 8760 hours. CBG: Recent Labs  Lab 05/21/20 1205 05/21/20 1725 05/21/20 2002 05/22/20 0015 05/22/20 0407  GLUCAP 112* 91 151* 116* 112*   D-Dimer: No results for input(s): DDIMER in the last 72 hours. Hgb A1c: No results for input(s): HGBA1C in the last 72 hours. Lipid Profile: No results for input(s): CHOL, HDL, LDLCALC, TRIG, CHOLHDL, LDLDIRECT in the last 72 hours. Thyroid function studies: No results for input(s): TSH, T4TOTAL, T3FREE, THYROIDAB in the last 72 hours.  Invalid input(s): FREET3 Anemia work up: No results for input(s): VITAMINB12, FOLATE, FERRITIN, TIBC, IRON, RETICCTPCT in the last 72 hours. Sepsis Labs: Recent Labs  Lab 05/19/20 0231 05/20/20 0835 05/21/20 0733 05/22/20 0729  WBC 13.5* 15.1* 18.5* 17.9*   Microbiology Recent Results (from the past 240 hour(s))  Culture, respiratory (non-expectorated)     Status: None   Collection Time: 05/13/20  6:05 PM   Specimen: Tracheal Aspirate; Respiratory  Result Value Ref Range Status   Specimen Description TRACHEAL ASPIRATE  Final   Special Requests NONE  Final   Gram Stain   Final    NO WBC SEEN FEW GRAM POSITIVE COCCI Performed at  [facility], [address], [place], [place] [address]    Culture FEW METHICILLIN RESISTANT STAPHYLOCOCCUS AUREUS  Final   Report Status 05/15/2020 FINAL  Final   Organism ID, Bacteria METHICILLIN RESISTANT STAPHYLOCOCCUS AUREUS  Final      Susceptibility   Methicillin resistant staphylococcus aureus - MIC*    CIPROFLOXACIN <=0.5 SENSITIVE Sensitive     ERYTHROMYCIN >=8 RESISTANT Resistant     GENTAMICIN <=0.5 SENSITIVE Sensitive     OXACILLIN >=4  RESISTANT Resistant     TETRACYCLINE <=1 SENSITIVE Sensitive     VANCOMYCIN <=0.5 SENSITIVE Sensitive     TRIMETH/SULFA <=10 SENSITIVE Sensitive     CLINDAMYCIN <=0.25 SENSITIVE Sensitive     RIFAMPIN <=0.5 SENSITIVE Sensitive     Inducible Clindamycin NEGATIVE Sensitive     * FEW METHICILLIN RESISTANT STAPHYLOCOCCUS AUREUS  Culture, blood (routine x 2)     Status: Abnormal (Preliminary result)   Collection Time: 05/13/20  6:21 PM   Specimen: BLOOD  Result Value Ref Range Status   Specimen Description BLOOD LEFT ANTECUBITAL  Final   Special Requests   Final    BOTTLES DRAWN AEROBIC AND ANAEROBIC Blood Culture adequate volume   Culture  Setup Time   Final    GRAM POSITIVE COCCI IN BOTH AEROBIC AND ANAEROBIC BOTTLES CRITICAL VALUE NOTED.  VALUE IS CONSISTENT WITH PREVIOUSLY REPORTED AND CALLED VALUE.    Culture (A)  Final    STAPHYLOCOCCUS AUREUS SUSCEPTIBILITIES PERFORMED ON PREVIOUS CULTURE WITHIN THE LAST 5 DAYS. Sent to [facility] for further susceptibility testing. Performed at [facility], [address] [place], [place] [address]    Report Status PENDING  Incomplete  Culture, blood (routine x 2)     Status: Abnormal (Preliminary result)   Collection Time: 05/13/20  6:30 PM   Specimen: BLOOD LEFT FOREARM  Result Value Ref Range Status   Specimen Description BLOOD LEFT FOREARM  Final   Special Requests   Final    BOTTLES DRAWN AEROBIC AND ANAEROBIC Blood Culture adequate volume   Culture  Setup Time   Final    GRAM POSITIVE COCCI IN CLUSTERS ANAEROBIC BOTTLE ONLY CRITICAL RESULT CALLED TO, READ BACK BY AND VERIFIED WITH: Eduardo Osier MACCIA [id number] AT 1441 BY CM Performed at [facility], [address], [place], [place] [address]    Culture METHICILLIN RESISTANT STAPHYLOCOCCUS AUREUS (A)  Final   Report Status PENDING  Incomplete   Organism ID, Bacteria METHICILLIN RESISTANT STAPHYLOCOCCUS AUREUS  Final      Susceptibility   Methicillin resistant staphylococcus  aureus - MIC*    CIPROFLOXACIN <=0.5 SENSITIVE Sensitive     ERYTHROMYCIN >=8 RESISTANT Resistant     GENTAMICIN <=0.5 SENSITIVE Sensitive     OXACILLIN >=4 RESISTANT Resistant     TETRACYCLINE <=1 SENSITIVE Sensitive     VANCOMYCIN 2 SENSITIVE Sensitive     TRIMETH/SULFA <=10 SENSITIVE Sensitive     CLINDAMYCIN <=0.25 SENSITIVE Sensitive     RIFAMPIN <=0.5 SENSITIVE Sensitive     Inducible Clindamycin NEGATIVE Sensitive     * METHICILLIN RESISTANT STAPHYLOCOCCUS AUREUS  MRSA PCR Screening     Status: Abnormal   Collection Time: 05/13/20  7:45 PM   Specimen: Nasal Mucosa; Nasopharyngeal  Result Value Ref Range Status   MRSA by PCR POSITIVE (A) NEGATIVE Final    Comment:        The GeneXpert MRSA Assay (FDA approved for NASAL specimens only), is one component of a comprehensive  MRSA colonization surveillance program. It is not intended to diagnose MRSA infection nor to guide or monitor treatment for MRSA infections. RESULT CALLED TO, READ BACK BY AND VERIFIED WITH: Jarome Matin RN 05/13/20 2117 JDW Performed at [facility], [address] [place], [place] [address]   Culture, blood (routine x 2)     Status: None   Collection Time: 05/17/20  9:42 AM   Specimen: BLOOD LEFT HAND  Result Value Ref Range Status   Specimen Description BLOOD LEFT HAND  Final   Special Requests   Final    BOTTLES DRAWN AEROBIC ONLY Blood Culture adequate volume   Culture   Final    NO GROWTH 5 DAYS Performed at [facility], [address], [place], [place] [address]    Report Status 05/22/2020 FINAL  Final  Culture, blood (routine x 2)     Status: None   Collection Time: 05/17/20  9:55 AM   Specimen: BLOOD LEFT HAND  Result Value Ref Range Status   Specimen Description BLOOD LEFT HAND  Final   Special Requests   Final    BOTTLES DRAWN AEROBIC ONLY Blood Culture adequate volume   Culture   Final    NO GROWTH 5 DAYS Performed at [facility], [address], [place],  [place] [address]    Report Status 05/22/2020 FINAL  Final  Culture, blood (routine x 2)     Status: None (Preliminary result)   Collection Time: 05/19/20 11:37 AM   Specimen: BLOOD  Result Value Ref Range Status   Specimen Description BLOOD LEFT ANTECUBITAL  Final   Special Requests   Final    BOTTLES DRAWN AEROBIC AND ANAEROBIC Blood Culture adequate volume   Culture   Final    NO GROWTH 3 DAYS Performed at [facility], [address] [place], [place] [address]    Report Status PENDING  Incomplete  Culture, blood (routine x 2)     Status: None (Preliminary result)   Collection Time: 05/19/20 11:37 AM   Specimen: BLOOD  Result Value Ref Range Status   Specimen Description BLOOD BLOOD LEFT HAND  Final   Special Requests   Final    BOTTLES DRAWN AEROBIC AND ANAEROBIC Blood Culture adequate volume   Culture  Setup Time   Final    GRAM POSITIVE COCCI IN CLUSTERS ANAEROBIC BOTTLE ONLY CRITICAL VALUE NOTED.  VALUE IS CONSISTENT WITH PREVIOUSLY REPORTED AND CALLED VALUE. Performed at [facility], [address], [place], [place] [address]    Culture GRAM POSITIVE COCCI  Final   Report Status PENDING  Incomplete     Medications:   . carvedilol  3.125 mg Oral BID WC  . Chlorhexidine Gluconate Cloth  6 each Topical Daily  . docusate sodium  100 mg Oral BID  . enoxaparin (LOVENOX) injection  40 mg Subcutaneous Q24H  . feeding supplement (ENSURE ENLIVE)  237 mL Oral TID BM  . mouth rinse  15 mL Mouth Rinse BID  . multivitamin with minerals  1 tablet Oral Daily  . pantoprazole  40 mg Oral Daily  . polyethylene glycol  17 g Oral Daily  . potassium chloride  40 mEq Oral Q12H  . sacubitril-valsartan  1 tablet Oral BID  . sodium chloride flush  3 mL Intravenous Q12H  . spironolactone  25 mg Oral Daily   Continuous Infusions: . sodium chloride Stopped (05/18/20 1552)  . ceFTAROline (TEFLARO) IV 600 mg (05/22/20  [id number])  . DAPTOmycin (CUBICIN)  IV 500 mg (05/21/20 2144)       LOS: 24 days   Marinda ElkAbraham Feliz Ortiz  [facility]  05/22/2020, 10:43 AM

## 2020-05-22 NOTE — Progress Notes (Signed)
Still awaiting for Teflaro IV from pharmacist. They were being notified.

## 2020-05-23 LAB — CBC
HCT: 34.7 % — ABNORMAL LOW (ref 39.0–52.0)
Hemoglobin: 11 g/dL — ABNORMAL LOW (ref 13.0–17.0)
MCH: 28.1 pg (ref 26.0–34.0)
MCHC: 31.7 g/dL (ref 30.0–36.0)
MCV: 88.5 fL (ref 80.0–100.0)
Platelets: 284 10*3/uL (ref 150–400)
RBC: 3.92 MIL/uL — ABNORMAL LOW (ref 4.22–5.81)
RDW: 15.6 % — ABNORMAL HIGH (ref 11.5–15.5)
WBC: 16.8 10*3/uL — ABNORMAL HIGH (ref 4.0–10.5)
nRBC: 0 % (ref 0.0–0.2)

## 2020-05-23 LAB — GLUCOSE, CAPILLARY
Glucose-Capillary: 103 mg/dL — ABNORMAL HIGH (ref 70–99)
Glucose-Capillary: 110 mg/dL — ABNORMAL HIGH (ref 70–99)
Glucose-Capillary: 119 mg/dL — ABNORMAL HIGH (ref 70–99)

## 2020-05-23 LAB — COMPREHENSIVE METABOLIC PANEL WITH GFR
ALT: 49 U/L — ABNORMAL HIGH (ref 0–44)
AST: 37 U/L (ref 15–41)
Albumin: 2.7 g/dL — ABNORMAL LOW (ref 3.5–5.0)
Alkaline Phosphatase: 105 U/L (ref 38–126)
Anion gap: 11 (ref 5–15)
BUN: 15 mg/dL (ref 6–20)
CO2: 24 mmol/L (ref 22–32)
Calcium: 9.3 mg/dL (ref 8.9–10.3)
Chloride: 102 mmol/L (ref 98–111)
Creatinine, Ser: 0.78 mg/dL (ref 0.61–1.24)
GFR calc Af Amer: >60 mL/min
GFR calc non Af Amer: >60 mL/min
Glucose, Bld: 93 mg/dL (ref 70–99)
Potassium: 4.4 mmol/L (ref 3.5–5.1)
Sodium: 137 mmol/L (ref 135–145)
Total Bilirubin: 0.6 mg/dL (ref 0.3–1.2)
Total Protein: 8.1 g/dL (ref 6.5–8.1)

## 2020-05-23 NOTE — Progress Notes (Signed)
[facility]  Date of Admission:  04/28/2020     Total days of antibiotics  25         ASSESSMENT:  [name]'s blood cultures from 7/29 have turned positive for MRSA indicating continued bacteremia following removal of his central line despite previous cultures on 7/27 being negative. 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 MRSA sensitivity testing returned and is sensitive to Daptomycin. Repeat blood culture today. Will plan to stop Ceftaroline tomorrow and continue daptomycin. He will need a prolonged course of at least 6 weeks and is agreeable to stay in the hospital for the duration of treatment at this point.   PLAN:  1. Continue Daptomycin and ceftraoline. 2. Discontinue ceftaroline on 8/3 with stop orders placed.  3. Monitor repeat cultures for clearance of bacteremia.  Principal Problem:   Endocarditis of tricuspid valve Active Problems:   Intravenous drug abuse (HCC)   Septic embolism (HCC)   Sepsis (HCC)   Thrombocytopenia (HCC)   Hypokalemia   Hyponatremia   MRSA bacteremia   Malnutrition of moderate degree   Threatening behavior   Hand abscess   Elevated BUN   Pressure injury of skin   Chest pain   Acute respiratory failure (HCC)   Acute systolic heart failure (HCC)   . carvedilol  3.125 mg Oral BID WC  . Chlorhexidine Gluconate Cloth  6 each Topical Daily  . docusate sodium  100 mg Oral BID  . enoxaparin (LOVENOX) injection  40 mg Subcutaneous Q24H  . feeding supplement (ENSURE ENLIVE)  237 mL Oral TID BM  . mouth rinse  15 mL Mouth Rinse BID  . multivitamin with minerals  1 tablet Oral Daily  . pantoprazole  40 mg Oral Daily  . polyethylene glycol  17 g Oral Daily  . potassium chloride  40 mEq Oral Q12H  . sacubitril-valsartan  1 tablet Oral BID  . sodium chloride flush  3 mL Intravenous Q12H  . spironolactone  25 mg Oral Daily    SUBJECTIVE:  Afebrile overnight with no acute  events. Feeling weak but doing okay.   No Known Allergies   Review of Systems: Review of Systems  Constitutional: Negative for chills, fever and weight loss.  Respiratory: Negative for cough, shortness of breath and wheezing.   Cardiovascular: Negative for chest pain and leg swelling.  Gastrointestinal: Negative for abdominal pain, constipation, diarrhea, nausea and vomiting.  Skin: Negative for rash.      OBJECTIVE: Vitals:   05/23/20 1100 05/23/20 1141 05/23/20 1142 05/23/20 1143  BP:    103/78  Pulse:      Resp: (!) 27 18 18 18   Temp:  98.3 F (36.8 C)  98.3 F (36.8 C)  TempSrc:  Oral    SpO2:  98%  97%  Weight:      Height:       Body mass index is 18.44 kg/m.  Physical Exam Constitutional:      General: He is not in acute distress.    Appearance: He is well-developed.  Cardiovascular:     Rate and Rhythm: Normal rate and regular rhythm.     Heart sounds: Normal heart sounds.  Pulmonary:     Effort: Pulmonary effort is normal.     Breath sounds: Normal breath sounds.  Skin:    General: Skin is warm and dry.  Neurological:     Mental Status: He is alert and oriented to person,  place, and time.  Psychiatric:        Behavior: Behavior normal.        Thought Content: Thought content normal.        Judgment: Judgment normal.     Lab Results Lab Results  Component Value Date   WBC 16.8 (H) 05/23/2020   HGB 11.0 (L) 05/23/2020   HCT 34.7 (L) 05/23/2020   MCV 88.5 05/23/2020   PLT 284 05/23/2020    Lab Results  Component Value Date   CREATININE 0.78 05/23/2020   BUN 15 05/23/2020   NA 137 05/23/2020   K 4.4 05/23/2020   CL 102 05/23/2020   CO2 24 05/23/2020    Lab Results  Component Value Date   ALT 49 (H) 05/23/2020   AST 37 05/23/2020   ALKPHOS 105 05/23/2020   BILITOT 0.6 05/23/2020     Microbiology: Recent Results (from the past 240 hour(s))  Culture, respiratory (non-expectorated)     Status: None   Collection Time: 05/13/20  6:05  PM   Specimen: Tracheal Aspirate; Respiratory  Result Value Ref Range Status   Specimen Description TRACHEAL ASPIRATE  Final   Special Requests NONE  Final   Gram Stain   Final    NO WBC SEEN FEW GRAM POSITIVE COCCI Performed at [facility], [address], [place], [place] [address]    Culture FEW METHICILLIN RESISTANT STAPHYLOCOCCUS AUREUS  Final   Report Status 05/15/2020 FINAL  Final   Organism ID, Bacteria METHICILLIN RESISTANT STAPHYLOCOCCUS AUREUS  Final      Susceptibility   Methicillin resistant staphylococcus aureus - MIC*    CIPROFLOXACIN <=0.5 SENSITIVE Sensitive     ERYTHROMYCIN >=8 RESISTANT Resistant     GENTAMICIN <=0.5 SENSITIVE Sensitive     OXACILLIN >=4 RESISTANT Resistant     TETRACYCLINE <=1 SENSITIVE Sensitive     VANCOMYCIN <=0.5 SENSITIVE Sensitive     TRIMETH/SULFA <=10 SENSITIVE Sensitive     CLINDAMYCIN <=0.25 SENSITIVE Sensitive     RIFAMPIN <=0.5 SENSITIVE Sensitive     Inducible Clindamycin NEGATIVE Sensitive     * FEW METHICILLIN RESISTANT STAPHYLOCOCCUS AUREUS  Culture, blood (routine x 2)     Status: Abnormal (Preliminary result)   Collection Time: 05/13/20  6:21 PM   Specimen: BLOOD  Result Value Ref Range Status   Specimen Description BLOOD LEFT ANTECUBITAL  Final   Special Requests   Final    BOTTLES DRAWN AEROBIC AND ANAEROBIC Blood Culture adequate volume   Culture  Setup Time   Final    GRAM POSITIVE COCCI IN BOTH AEROBIC AND ANAEROBIC BOTTLES CRITICAL VALUE NOTED.  VALUE IS CONSISTENT WITH PREVIOUSLY REPORTED AND CALLED VALUE.    Culture (A)  Final    STAPHYLOCOCCUS AUREUS SUSCEPTIBILITIES PERFORMED ON PREVIOUS CULTURE WITHIN THE LAST 5 DAYS. Sent to [facility] for further susceptibility testing. Performed at [facility], [address] [place], [place] [address]    Report Status PENDING  Incomplete  Culture, blood (routine x 2)     Status: Abnormal (Preliminary result)   Collection Time: 05/13/20  6:30 PM   Specimen:  BLOOD LEFT FOREARM  Result Value Ref Range Status   Specimen Description BLOOD LEFT FOREARM  Final   Special Requests   Final    BOTTLES DRAWN AEROBIC AND ANAEROBIC Blood Culture adequate volume   Culture  Setup Time   Final    GRAM POSITIVE COCCI IN CLUSTERS ANAEROBIC BOTTLE ONLY CRITICAL RESULT CALLED TO, READ BACK  BY AND VERIFIED WITH: Eduardo Osier MACCIA [id number] AT 1441 BY CM Performed at [facility], [address] [place], [place] [address]    Culture METHICILLIN RESISTANT STAPHYLOCOCCUS AUREUS (A)  Final   Report Status PENDING  Incomplete   Organism ID, Bacteria METHICILLIN RESISTANT STAPHYLOCOCCUS AUREUS  Final      Susceptibility   Methicillin resistant staphylococcus aureus - MIC*    CIPROFLOXACIN <=0.5 SENSITIVE Sensitive     ERYTHROMYCIN >=8 RESISTANT Resistant     GENTAMICIN <=0.5 SENSITIVE Sensitive     OXACILLIN >=4 RESISTANT Resistant     TETRACYCLINE <=1 SENSITIVE Sensitive     VANCOMYCIN 2 SENSITIVE Sensitive     TRIMETH/SULFA <=10 SENSITIVE Sensitive     CLINDAMYCIN <=0.25 SENSITIVE Sensitive     RIFAMPIN <=0.5 SENSITIVE Sensitive     Inducible Clindamycin NEGATIVE Sensitive     * METHICILLIN RESISTANT STAPHYLOCOCCUS AUREUS  MRSA PCR Screening     Status: Abnormal   Collection Time: 05/13/20  7:45 PM   Specimen: Nasal Mucosa; Nasopharyngeal  Result Value Ref Range Status   MRSA by PCR POSITIVE (A) NEGATIVE Final    Comment:        The GeneXpert MRSA Assay (FDA approved for NASAL specimens only), is one component of a comprehensive MRSA colonization surveillance program. It is not intended to diagnose MRSA infection nor to guide or monitor treatment for MRSA infections. RESULT CALLED TO, READ BACK BY AND VERIFIED WITH: Jarome Matin RN 05/13/20 2117 JDW Performed at [facility], [address], [place], [place] [address]   Culture, blood (routine x 2)     Status: None   Collection Time: 05/17/20  9:42 AM   Specimen: BLOOD LEFT HAND  Result Value  Ref Range Status   Specimen Description BLOOD LEFT HAND  Final   Special Requests   Final    BOTTLES DRAWN AEROBIC ONLY Blood Culture adequate volume   Culture   Final    NO GROWTH 5 DAYS Performed at [facility], [address], [place], [place] [address]    Report Status 05/22/2020 FINAL  Final  Culture, blood (routine x 2)     Status: None   Collection Time: 05/17/20  9:55 AM   Specimen: BLOOD LEFT HAND  Result Value Ref Range Status   Specimen Description BLOOD LEFT HAND  Final   Special Requests   Final    BOTTLES DRAWN AEROBIC ONLY Blood Culture adequate volume   Culture   Final    NO GROWTH 5 DAYS Performed at [facility], [address], [place], [place] [address]    Report Status 05/22/2020 FINAL  Final  Culture, blood (routine x 2)     Status: None (Preliminary result)   Collection Time: 05/19/20 11:37 AM   Specimen: BLOOD  Result Value Ref Range Status   Specimen Description BLOOD LEFT ANTECUBITAL  Final   Special Requests   Final    BOTTLES DRAWN AEROBIC AND ANAEROBIC Blood Culture adequate volume   Culture   Final    NO GROWTH 4 DAYS Performed at [facility], [address], [place], [place] [address]    Report Status PENDING  Incomplete  Culture, blood (routine x 2)     Status: Abnormal (Preliminary result)   Collection Time: 05/19/20 11:37 AM   Specimen: BLOOD  Result Value Ref Range Status   Specimen Description BLOOD BLOOD LEFT HAND  Final   Special Requests  Final    BOTTLES DRAWN AEROBIC AND ANAEROBIC Blood Culture adequate volume   Culture  Setup Time   Final    GRAM POSITIVE COCCI IN CLUSTERS IN BOTH AEROBIC AND ANAEROBIC BOTTLES CRITICAL VALUE NOTED.  VALUE IS CONSISTENT WITH PREVIOUSLY REPORTED AND CALLED VALUE.    Culture (A)  Final    STAPHYLOCOCCUS AUREUS SUSCEPTIBILITIES TO FOLLOW Performed at [facility], [address], [place], [place] [address]    Report Status PENDING  Incomplete     Marcos Eke,  NP [facility]  05/23/2020  12:14 PM

## 2020-05-23 NOTE — Progress Notes (Signed)
[facility] PROGRESS NOTE    Progress Note  Clifford Clark  IOE:[id number] DOB: 1990/11/03 DOA: 04/28/2020 PCP: Patient, [name]     Brief Narrative:   Clifford Clark is an 29 y.o. 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  2D echo showed an EF of 60% with a PFO and a large vegetation in the tricuspid valve.  He underwent AngioJet removal of part of the vegetation,repeat a 2D echo showed an EF of 20%.  Extubated on 05/19/2020.  05/03/2020 I&D of abscess.  Grew MSSA. 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  Next  05/11/2020 upper extremity Doppler showed no DVT.  05/13/2020 CTA of the chest without PE multifocal and septic emboli.  With multiple cavitary lesions consolidation and anasarca.  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  04/28/2020 blood cultures grew MRSA repeated blood cultures from 05/13/2020 + for MRSA, repeated blood cultures on 05/17/2020 are negative till date 05/03/2020 fungal cultures remain negative till date.  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 Status post debulking with angio vac on 05/10/2020 due to large vegetation. ID is on board and appreciate assistance. Repeated 2D echo was on 05/19/2020 that showed an EF of 30%, with persistent tricuspid vegetation measuring 1.8 x 0.8 cm-to the posterior leaflet.  With severe tricuspid regurgitation. Continue IV empiric antibiotics.  Acute respiratory failure with hypoxia: Extubated on 05/18/2020.  Septic shock requiring pressors: Secondary to MRSA bacteremia on admission started off pressors weaned off pressors and transfer to progressive unit on 05/20/2020. Continue current antibiotic regimen will need at least 6 weeks of antibiotics. To recheck CK levels intermittently.  Acute systolic heart failure question septic cardiomyopathy: Initial 2D echo on admission showed an EF of 60%, repeat a 2D echo on 05/14/2020 showed a reduced EF of 20%. Suspect drop in EF is due to septic shock. Continues to be negative diuresing well increased creatinine has remained stable.  He appears euvolemic on physical exam. Continue to monitor blood pressure continue Coreg, Entresto and Aldactone.  Normocytic anemia: Hemoglobin is now 10 appears to be stable. Signs of overt bleeding.  In the setting of infectious etiology.  HypoKalemia: Resolved with oral repletion and Aldactone.  Substance abuse disorder: Patient admits to opiates and cocaine abuse. Will need counseling.  Bilateral wrist abscess: Orthopedic surgery was consulted and he was I&D on 05/03/2020 cultures grew MSSA.  Suture removal by orthopedic surgery.  Malnutrition of moderate  degree Ensure 3 times daily.  DVT prophylaxis: lovenox Family Communication:none Status is: Inpatient  Remains inpatient appropriate because:Hemodynamically unstable   Dispo: The patient is from: Home              Anticipated d/c is to: Home              Anticipated d/c date is: > 3 days              Patient currently is not medically stable to d/c.  Patient will need to finish his antibiotic regimen in house will be a total of 6 weeks.     Code Status:     Code Status Orders  (From admission, onward)         Start     Ordered   04/28/20 2123  Full code  Continuous        04/28/20 2122        Code Status History    Date Active Date Inactive Code Status Order ID Comments User Context   01/24/2019 1806 01/28/2019 1001 Full Code [id number]  Oneta Rack, NP Inpatient   01/24/2019 1451 01/24/2019 1752 Full Code [id number]  Raeford Razor, MD ED   01/25/2013 1812 01/26/2013 1256 Full Code [id number]  Desmond Dike ED   Advance Care Planning Activity        IV Access:    Peripheral IV   Procedures and diagnostic studies:   No results found.   Medical Consultants:    None.  Anti-Infectives:   Currently on daptomycin and Teflaro  Subjective:    Clifford Clark relates no new complaints.  Objective:    Vitals:   05/22/20 1943 05/23/20 0000 05/23/20 0406 05/23/20 0727  BP: (!) 91/60 99/71 103/74 109/84  Pulse: (!) 120 103 96 93  Resp: 23 (!) 28 (!) 24 18  Temp: 98.5 F (36.9 C) 98.6 F (37 C) 98.5 F (36.9 C)   TempSrc: Oral Oral Oral   SpO2: 97% 98% 98% 98%  Weight:  53.4 kg    Height:       SpO2: 98 % O2 Flow Rate (L/min): 2 L/min FiO2 (%): 40 %   Intake/Output Summary (Last 24 hours) at 05/23/2020 0915 Last data filed at 05/23/2020 7824 Gross per 24 hour  Intake 960 ml  Output 3500 ml  Net -2540 ml   Filed Weights   05/21/20 0000 05/22/20 0400 05/23/20 0000  Weight: 53.5 kg 52.6 kg 53.4 kg    Exam: General exam: In no acute distress,  cachectic Respiratory system: Good air movement and clear to auscultation. Cardiovascular system: S1 & S2 heard, RRR. No JVD. Gastrointestinal system: Abdomen is nondistended, soft and nontender.  Extremities: No pedal edema. Skin: No rashes, lesions or ulcers Psychiatry: Judgement and insight appear normal. Mood & affect appropriate.   Data Reviewed:    Labs: Basic Metabolic Panel: Recent Labs  Lab 05/18/20 0344 05/18/20 0344 05/19/20 0231 05/19/20 0231 05/20/20 2353 05/20/20 6144 05/21/20 3154 05/21/20 0733 05/22/20 0729 05/23/20 0509  NA 141   < > 139  --  139  --  135  --  137 137  K 3.8   < > 3.4*   < > 4.1   < > 4.2   < > 4.3 4.4  CL 100   < > 99  --  106  --  104  --  104 102  CO2 32   < >  31  --  24  --  22  --  25 24  GLUCOSE 114*   < > 122*  --  136*  --  110*  --  102* 93  BUN 16   < > 10  --  8  --  10  --  11 15  CREATININE 0.51*   < > 0.57*  --  0.63  --  0.69  --  0.67 0.78  CALCIUM 8.2*   < > 8.0*  --  8.2*  --  8.4*  --  9.0 9.3  MG 1.9  --   --   --  2.0  --   --   --   --   --    < > = values in this interval not displayed.   GFR Estimated Creatinine Clearance: 102 mL/min (by C-G formula based on SCr of 0.78 mg/dL). Liver Function Tests: Recent Labs  Lab 05/19/20 0231 05/20/20 0835 05/21/20 0733 05/22/20 0729 05/23/20 0509  AST 18 21 26 31  37  ALT 18 21 30  39 49*  ALKPHOS 126 112 98 104 105  BILITOT 0.7 0.5 0.6 0.5 0.6  PROT 6.4* 6.9 7.1 7.8 8.1  ALBUMIN 1.8* 1.9* 2.1* 2.4* 2.7*   No results for input(s): LIPASE, AMYLASE in the last 168 hours. No results for input(s): AMMONIA in the last 168 hours. Coagulation profile No results for input(s): INR, PROTIME in the last 168 hours. COVID-19 Labs  No results for input(s): DDIMER, FERRITIN, LDH, CRP in the last 72 hours.  Lab Results  Component Value Date   SARSCOV2NAA NEGATIVE 04/28/2020    CBC: Recent Labs  Lab 05/19/20 0231 05/20/20 0835 05/21/20 0733 05/22/20 0729  05/23/20 0509  WBC 13.5* 15.1* 18.5* 17.9* 16.8*  HGB 8.1* 10.7* 10.9* 10.9* 11.0*  HCT 25.6* 34.2* 34.5* 34.0* 34.7*  MCV 87.1 87.0 85.6 86.3 88.5  PLT 240 274 283 276 284   Cardiac Enzymes: Recent Labs  Lab 05/18/20 1344  CKTOTAL 30*   BNP (last 3 results) No results for input(s): PROBNP in the last 8760 hours. CBG: Recent Labs  Lab 05/22/20 1228 05/22/20 1622 05/22/20 2004 05/23/20 0008 05/23/20 0404  GLUCAP 145* 125* 143* 103* 110*   D-Dimer: No results for input(s): DDIMER in the last 72 hours. Hgb A1c: No results for input(s): HGBA1C in the last 72 hours. Lipid Profile: No results for input(s): CHOL, HDL, LDLCALC, TRIG, CHOLHDL, LDLDIRECT in the last 72 hours. Thyroid function studies: No results for input(s): TSH, T4TOTAL, T3FREE, THYROIDAB in the last 72 hours.  Invalid input(s): FREET3 Anemia work up: No results for input(s): VITAMINB12, FOLATE, FERRITIN, TIBC, IRON, RETICCTPCT in the last 72 hours. Sepsis Labs: Recent Labs  Lab 05/20/20 0835 05/21/20 0733 05/22/20 0729 05/23/20 0509  WBC 15.1* 18.5* 17.9* 16.8*   Microbiology Recent Results (from the past 240 hour(s))  Culture, respiratory (non-expectorated)     Status: None   Collection Time: 05/13/20  6:05 PM   Specimen: Tracheal Aspirate; Respiratory  Result Value Ref Range Status   Specimen Description TRACHEAL ASPIRATE  Final   Special Requests NONE  Final   Gram Stain   Final    NO WBC SEEN FEW GRAM POSITIVE COCCI Performed at [facility], [address] [place], [place] [id number]    Culture FEW METHICILLIN RESISTANT STAPHYLOCOCCUS AUREUS  Final   Report Status 05/15/2020 FINAL  Final   Organism ID, Bacteria METHICILLIN RESISTANT STAPHYLOCOCCUS AUREUS  Final  Susceptibility   Methicillin resistant staphylococcus aureus - MIC*    CIPROFLOXACIN <=0.5 SENSITIVE Sensitive     ERYTHROMYCIN >=8 RESISTANT Resistant     GENTAMICIN <=0.5 SENSITIVE Sensitive     OXACILLIN >=4  RESISTANT Resistant     TETRACYCLINE <=1 SENSITIVE Sensitive     VANCOMYCIN <=0.5 SENSITIVE Sensitive     TRIMETH/SULFA <=10 SENSITIVE Sensitive     CLINDAMYCIN <=0.25 SENSITIVE Sensitive     RIFAMPIN <=0.5 SENSITIVE Sensitive     Inducible Clindamycin NEGATIVE Sensitive     * FEW METHICILLIN RESISTANT STAPHYLOCOCCUS AUREUS  Culture, blood (routine x 2)     Status: Abnormal (Preliminary result)   Collection Time: 05/13/20  6:21 PM   Specimen: BLOOD  Result Value Ref Range Status   Specimen Description BLOOD LEFT ANTECUBITAL  Final   Special Requests   Final    BOTTLES DRAWN AEROBIC AND ANAEROBIC Blood Culture adequate volume   Culture  Setup Time   Final    GRAM POSITIVE COCCI IN BOTH AEROBIC AND ANAEROBIC BOTTLES CRITICAL VALUE NOTED.  VALUE IS CONSISTENT WITH PREVIOUSLY REPORTED AND CALLED VALUE.    Culture (A)  Final    STAPHYLOCOCCUS AUREUS SUSCEPTIBILITIES PERFORMED ON PREVIOUS CULTURE WITHIN THE LAST 5 DAYS. Sent to [facility] for further susceptibility testing. Performed at [facility], [address] [place], [place] [address]    Report Status PENDING  Incomplete  Culture, blood (routine x 2)     Status: Abnormal (Preliminary result)   Collection Time: 05/13/20  6:30 PM   Specimen: BLOOD LEFT FOREARM  Result Value Ref Range Status   Specimen Description BLOOD LEFT FOREARM  Final   Special Requests   Final    BOTTLES DRAWN AEROBIC AND ANAEROBIC Blood Culture adequate volume   Culture  Setup Time   Final    GRAM POSITIVE COCCI IN CLUSTERS ANAEROBIC BOTTLE ONLY CRITICAL RESULT CALLED TO, READ BACK BY AND VERIFIED WITH: Eduardo Osier MACCIA [id number] AT 1441 BY CM Performed at [facility], [address] [place], [place] [address]    Culture METHICILLIN RESISTANT STAPHYLOCOCCUS AUREUS (A)  Final   Report Status PENDING  Incomplete   Organism ID, Bacteria METHICILLIN RESISTANT STAPHYLOCOCCUS AUREUS  Final      Susceptibility   Methicillin resistant staphylococcus  aureus - MIC*    CIPROFLOXACIN <=0.5 SENSITIVE Sensitive     ERYTHROMYCIN >=8 RESISTANT Resistant     GENTAMICIN <=0.5 SENSITIVE Sensitive     OXACILLIN >=4 RESISTANT Resistant     TETRACYCLINE <=1 SENSITIVE Sensitive     VANCOMYCIN 2 SENSITIVE Sensitive     TRIMETH/SULFA <=10 SENSITIVE Sensitive     CLINDAMYCIN <=0.25 SENSITIVE Sensitive     RIFAMPIN <=0.5 SENSITIVE Sensitive     Inducible Clindamycin NEGATIVE Sensitive     * METHICILLIN RESISTANT STAPHYLOCOCCUS AUREUS  MRSA PCR Screening     Status: Abnormal   Collection Time: 05/13/20  7:45 PM   Specimen: Nasal Mucosa; Nasopharyngeal  Result Value Ref Range Status   MRSA by PCR POSITIVE (A) NEGATIVE Final    Comment:        The GeneXpert MRSA Assay (FDA approved for NASAL specimens only), is one component of a comprehensive MRSA colonization surveillance program. It is not intended to diagnose MRSA infection nor to guide or monitor treatment for MRSA infections. RESULT CALLED TO, READ BACK BY AND VERIFIED WITH: Jarome Matin RN 05/13/20 2117 JDW Performed at [facility], [address]  [place], [place] [address]   Culture, blood (routine x 2)     Status: None   Collection Time: 05/17/20  9:42 AM   Specimen: BLOOD ValeND  Result Value Ref Range Status   Specimen Description BLOOD LEFT HAND  Final   Special Requests   Final    BOTTLES DRAWN AEROBIC ONLY Blood Culture adequate volume   Culture   Final    NO GROWTH 5 DAYS Performed at [facility], [address], [place], [place] [address]    Report Status 05/22/2020 FINAL  Final  Culture, blood (routine x 2)     Status: None   Collection Time: 05/17/20  9:55 AM   Specimen: BLOOD LEFT HAND  Result Value Ref Range Status   Specimen Description BLOOD LEFT HAND  Final   Special Requests   Final    BOTTLES DRAWN AEROBIC ONLY Blood Culture adequate volume   Culture   Final    NO GROWTH 5 DAYS Performed at [facility], [address] [place],  [place] [address]    Report Status 05/22/2020 FINAL  Final  Culture, blood (routine x 2)     Status: None (Preliminary result)   Collection Time: 05/19/20 11:37 AM   Specimen: BLOOD  Result Value Ref Range Status   Specimen Description BLOOD LEFT ANTECUBITAL  Final   Special Requests   Final    BOTTLES DRAWN AEROBIC AND ANAEROBIC Blood Culture adequate volume   Culture   Final    NO GROWTH 3 DAYS Performed at [facility], [address] [place], [place] [address]    Report Status PENDING  Incomplete  Culture, blood (routine x 2)     Status: None (Preliminary result)   Collection Time: 05/19/20 11:37 AM   Specimen: BLOOD  Result Value Ref Range Status   Specimen Description BLOOD BLOOD LEFT HAND  Final   Special Requests   Final    BOTTLES DRAWN AEROBIC AND ANAEROBIC Blood Culture adequate volume   Culture  Setup Time   Final    GRAM POSITIVE COCCI IN CLUSTERS IN BOTH AEROBIC AND ANAEROBIC BOTTLES CRITICAL VALUE NOTED.  VALUE IS CONSISTENT WITH PREVIOUSLY REPORTED AND CALLED VALUE. Performed at [facility], [address], [place], [place] [address]    Culture GRAM POSITIVE COCCI  Final   Report Status PENDING  Incomplete     Medications:   . carvedilol  3.125 mg Oral BID WC  . Chlorhexidine Gluconate Cloth  6 each Topical Daily  . docusate sodium  100 mg Oral BID  . enoxaparin (LOVENOX) injection  40 mg Subcutaneous Q24H  . feeding supplement (ENSURE ENLIVE)  237 mL Oral TID BM  . mouth rinse  15 mL Mouth Rinse BID  . multivitamin with minerals  1 tablet Oral Daily  . pantoprazole  40 mg Oral Daily  . polyethylene glycol  17 g Oral Daily  . potassium chloride  40 mEq Oral Q12H  . sacubitril-valsartan  1 tablet Oral BID  . sodium chloride flush  3 mL Intravenous Q12H  . spironolactone  25 mg Oral Daily   Continuous Infusions: . sodium chloride Stopped (05/18/20 1552)  . ceFTAROline (TEFLARO) IV 600 mg (05/23/20 0641)  . DAPTOmycin (CUBICIN)  IV Stopped  (05/23/20 0700)      LOS: 25 days   Marinda Elk  [facility]  05/23/2020, 9:15 AM

## 2020-05-23 NOTE — Progress Notes (Signed)
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 s/p angiovac by CVTS 7/20. Intubated 7/23-7/28. PMHx: IVDU, asthma    PT Comments    Pt admitted with above diagnosis. Pt was able to ambulate with min guard assist and cues with RW with good stability overall and incr distance.  VSS with HR 110-132 bpm.  Pt tolerated incr distance and was happy he got up.  Did need encouragement initially.   Pt currently with functional limitations due to the deficits listed below (see PT Problem List). Pt will benefit from skilled PT to increase their independence and safety with mobility to allow discharge to the venue listed below.     Follow Up Recommendations  Home health PT;Supervision/Assistance - 24 hour     Equipment Recommendations  Rolling walker with 5" wheels    Recommendations for Other Services OT consult     Precautions / Restrictions Precautions Precautions: Fall Restrictions Weight Bearing Restrictions: No    Mobility  Bed Mobility Overal bed mobility: Needs Assistance Bed Mobility: Supine to Sit;Sit to Supine     Supine to sit: Supervision Sit to supine: Supervision      Transfers Overall transfer level: Needs assistance Equipment used: Rolling walker (2 wheeled) Transfers: Sit to/from [employer] Sit to Stand: Min guard         General transfer comment: assist for balance with posterior lean from bed and chair, cues for hand placement as pt wants to pull up on RW  Ambulation/Gait Ambulation/Gait assistance: Min guard;Supervision Gait Distance (Feet): 450 Feet Assistive device: Rolling walker (2 wheeled) Gait Pattern/deviations: Step-through pattern;Decreased stride length;Narrow base of support   Gait  velocity interpretation: 1.31 - 2.62 ft/sec, indicative of limited community ambulator General Gait Details: pt with short steps with narrow BOS, incr distance and greater stability with RW use.     Stairs             Wheelchair Mobility    Modified Rankin (Stroke Patients Only)       Balance Overall balance assessment: Needs assistance;History of Falls Sitting-balance support: No upper extremity supported;Feet supported Sitting balance-Leahy Scale: Fair     Standing balance support: Bilateral upper extremity supported;During functional activity Standing balance-Leahy Scale: Poor Standing balance comment: physical assist for balance in standing as well as UEs upport                            Cognition Arousal/Alertness: Awake/alert Behavior During Therapy: Flat affect Overall Cognitive Status: Impaired/Different from baseline Area of Impairment: Orientation;Attention;Safety/judgement;Awareness;Problem solving                 Orientation Level: Disoriented to;Situation;Time Current Attention Level: Sustained     Safety/Judgement: Decreased awareness of safety;Decreased awareness of deficits Awareness: Emergent Problem Solving: Slow processing;Requires verbal cues        Exercises      General Comments        Pertinent Vitals/Pain Pain Assessment: No/denies pain    Home Living                      Prior Function            PT Goals (current goals can now be  found in the care plan section) Acute Rehab PT Goals Patient Stated Goal: return to independence/drug rehab Progress towards PT goals: Progressing toward goals    Frequency    Min 3X/week      PT Plan Current plan remains appropriate    Co-evaluation              AM-PAC PT "6 Clicks" Mobility   Outcome Measure  Help needed turning from your back to your side while in a flat bed without using bedrails?: A Little Help needed moving from lying on your  back to sitting on the side of a flat bed without using bedrails?: A Little Help needed moving to and from a bed to a chair (including a wheelchair)?: A Little Help needed standing up from a chair using your arms (e.g., wheelchair or bedside chair)?: A Little Help needed to walk in hospital room?: A Little Help needed climbing 3-5 steps with a railing? : A Lot 6 Click Score: 17    End of Session Equipment Utilized During Treatment: Gait belt Activity Tolerance: Patient tolerated treatment well Patient left: with call bell/phone within reach;in bed;with bed alarm set Nurse Communication: Mobility status PT Visit Diagnosis: Other abnormalities of gait and mobility (R26.89);Difficulty in walking, not elsewhere classified (R26.2)     Time: 5830-9407 PT Time Calculation (min) (ACUTE ONLY): 15 min  Charges:  $Gait Training: 8-22 mins                     Tajanay Hurley W,PT Acute Rehabilitation Services Pager:  [phone]  Office:  [phone]     Berline Lopes 05/23/2020, 4:14 PM

## 2020-05-23 NOTE — Social Work (Signed)
CSW attempted to meet with patient to discuss housing stability and substance abuse resources. Patient was observed sleeping. TOC team will follow-up.  Verlon Au, LCSWA Clinical Social Worker

## 2020-05-24 LAB — COOXEMETRY PANEL
Carboxyhemoglobin: 1.1 % (ref 0.5–1.5)
Methemoglobin: 0.5 % (ref 0.0–1.5)
O2 Saturation: 62 %
Total hemoglobin: 11.6 g/dL — ABNORMAL LOW (ref 12.0–16.0)

## 2020-05-24 LAB — CBC
HCT: 35.9 % — ABNORMAL LOW (ref 39.0–52.0)
Hemoglobin: 11.4 g/dL — ABNORMAL LOW (ref 13.0–17.0)
MCH: 27.8 pg (ref 26.0–34.0)
MCHC: 31.8 g/dL (ref 30.0–36.0)
MCV: 87.6 fL (ref 80.0–100.0)
Platelets: 282 10*3/uL (ref 150–400)
RBC: 4.1 MIL/uL — ABNORMAL LOW (ref 4.22–5.81)
RDW: 15.4 % (ref 11.5–15.5)
WBC: 16 10*3/uL — ABNORMAL HIGH (ref 4.0–10.5)
nRBC: 0 % (ref 0.0–0.2)

## 2020-05-24 LAB — CULTURE, BLOOD (ROUTINE X 2)
Culture: NO GROWTH
Special Requests: ADEQUATE
Special Requests: ADEQUATE

## 2020-05-24 LAB — COMPREHENSIVE METABOLIC PANEL
ALT: 62 U/L — ABNORMAL HIGH (ref 0–44)
AST: 41 U/L (ref 15–41)
Albumin: 2.7 g/dL — ABNORMAL LOW (ref 3.5–5.0)
Alkaline Phosphatase: 107 U/L (ref 38–126)
Anion gap: 8 (ref 5–15)
BUN: 20 mg/dL (ref 6–20)
CO2: 25 mmol/L (ref 22–32)
Calcium: 9.4 mg/dL (ref 8.9–10.3)
Chloride: 102 mmol/L (ref 98–111)
Creatinine, Ser: 0.73 mg/dL (ref 0.61–1.24)
GFR calc Af Amer: >60 mL/min (ref >60)
GFR calc non Af Amer: >60 mL/min (ref >60)
Glucose, Bld: 102 mg/dL — ABNORMAL HIGH (ref 70–99)
Potassium: 4.4 mmol/L (ref 3.5–5.1)
Sodium: 135 mmol/L (ref 135–145)
Total Bilirubin: 0.7 mg/dL (ref 0.3–1.2)
Total Protein: 8 g/dL (ref 6.5–8.1)

## 2020-05-24 LAB — GLUCOSE, CAPILLARY
Glucose-Capillary: 105 mg/dL — ABNORMAL HIGH (ref 70–99)
Glucose-Capillary: 105 mg/dL — ABNORMAL HIGH (ref 70–99)
Glucose-Capillary: 141 mg/dL — ABNORMAL HIGH (ref 70–99)

## 2020-05-24 MED ORDER — DOXYCYCLINE HYCLATE 100 MG PO TABS
100.0000 mg | ORAL_TABLET | Freq: Two times a day (BID) | ORAL | Status: DC
  Administered 2020-05-25 – 2020-06-17 (×47): 100 mg via ORAL
  Filled 2020-05-24 (×47): qty 1

## 2020-05-24 MED ORDER — DOXYCYCLINE HYCLATE 100 MG PO TABS: 100.0000 mg | ORAL_TABLET | Freq: Two times a day (BID) | ORAL | Status: DC

## 2020-05-24 NOTE — Progress Notes (Signed)
   05/24/20 1017  Assess: MEWS Score  Temp 98.2 F (36.8 C)  BP 107/65  Pulse Rate (!) 115  ECG Heart Rate (!) 115  Resp 20  Level of Consciousness Alert  SpO2 96 %  O2 Device Room Air  Patient Activity (if Appropriate) In bed  O2 Flow Rate (L/min) 0 L/min  Assess: MEWS Score  MEWS Temp 0  MEWS Systolic 0  MEWS Pulse 2  MEWS RR 0  MEWS LOC 0  MEWS Score 2  MEWS Score Color Yellow  Assess: if the MEWS score is Yellow or Red  Were vital signs taken at a resting state? Yes  Focused Assessment No change from prior assessment  Early Detection of Sepsis Score *See Row Information* Low  MEWS guidelines implemented *See Row Information* No, previously yellow, continue vital signs every 4 hours  Treat  MEWS Interventions Administered scheduled meds/treatments  Pain Scale 0-10  Pain Score 0   Patient is alert and oriented x4 and has no complaints of chest pain or trouble breathing. Patient is a chronic yellow MEWS due to elevated RR and HR, MD is aware. Gave scheduled medications. Will continue to monitor.

## 2020-05-24 NOTE — Progress Notes (Signed)
PHARMACY - PHYSICIAN COMMUNICATION CRITICAL VALUE ALERT - BLOOD CULTURE IDENTIFICATION (BCID)  Abdiel Blackerby is an 29 y.o. male who presented to [facility] on 04/28/2020 with multiple septic emboli and MRSA bacteremia. ID has been following and has repeated blood cultures to assess for clearance of MRSA.   Assessment:   1 bottle BCx growing GPR - likely represents contamination  Name of physician (or Provider) Contacted: Lambert Keto  Current antibiotics:  Daptomycin (Ceftaroline being discontinued today)  Changes to prescribed antibiotics recommended:  Patient is on recommended antibiotics - No changes needed  Margarite Gouge, PharmD PGY2 ID Pharmacy Resident [phone]   05/24/2020  2:49 PM

## 2020-05-24 NOTE — Progress Notes (Signed)
[facility]  Date of Admission:  04/28/2020     Total days of antibiotics 26         ASSESSMENT:  [name] blood cultures from 7/29 as expected are growing MRSA. Blood cultures from 8/2 remain pending. Clinically he continues to be stable and has been moving around more not currently requiring any supplemental oxygen. Will continue current dose of daptomycin and monitor cultures for clearance of bacteremia. Monitor CK levels while on daptomycin.  PLAN:  1. Continue current dose of daptomycin 2. Monitor blood cultures for clearance of bacteremia. 3. Therapeutic drug monitoring of CK levels while on daptomycin.  4. Continue mobilization as tolerated.   Principal Problem:   Endocarditis Active Problems:   Intravenous drug abuse (HCC)   Septic embolism (HCC)   Sepsis (HCC)   Thrombocytopenia (HCC)   Hypokalemia   Hyponatremia   MRSA bacteremia   Malnutrition of moderate degree   Threatening behavior   Hand abscess   Elevated BUN   Pressure injury of skin   Chest pain   Acute respiratory failure (HCC)   Acute systolic heart failure (HCC)   . carvedilol  3.125 mg Oral BID WC  . Chlorhexidine Gluconate Cloth  6 each Topical Daily  . docusate sodium  100 mg Oral BID  . enoxaparin (LOVENOX) injection  40 mg Subcutaneous Q24H  . feeding supplement (ENSURE ENLIVE)  237 mL Oral TID BM  . mouth rinse  15 mL Mouth Rinse BID  . multivitamin with minerals  1 tablet Oral Daily  . pantoprazole  40 mg Oral Daily  . polyethylene glycol  17 g Oral Daily  . potassium chloride  40 mEq Oral Q12H  . sacubitril-valsartan  1 tablet Oral BID  . sodium chloride flush  3 mL Intravenous Q12H  . spironolactone  25 mg Oral Daily    SUBJECTIVE:  Afebrile overnight with no acute events. Feeling okay today and been up and walking around more.   No Known Allergies   Review of Systems: Review of Systems  Constitutional: Negative for chills, fever and weight  loss.  Respiratory: Negative for cough, shortness of breath and wheezing.   Cardiovascular: Negative for chest pain and leg swelling.  Gastrointestinal: Negative for abdominal pain, constipation, diarrhea, nausea and vomiting.  Skin: Negative for rash.      OBJECTIVE: Vitals:   05/24/20 0500 05/24/20 0700 05/24/20 0817 05/24/20 1017  BP:    107/65  Pulse:    (!) 115  Resp: (!) 28 (!) 26 16 20   Temp:   98.3 F (36.8 C) 98.2 F (36.8 C)  TempSrc:   Oral   SpO2:   96%   Weight:      Height:       Body mass index is 18.13 kg/m.  Physical Exam Constitutional:      General: He is not in acute distress.    Appearance: He is well-developed.  Cardiovascular:     Rate and Rhythm: Normal rate and regular rhythm.     Heart sounds: Normal heart sounds.  Pulmonary:     Effort: Pulmonary effort is normal.     Breath sounds: Normal breath sounds.  Skin:    General: Skin is warm and dry.  Neurological:     Mental Status: He is alert and oriented to person, place, and time.  Psychiatric:        Behavior: Behavior normal.        Thought Content: Thought  content normal.        Judgment: Judgment normal.     Lab Results Lab Results  Component Value Date   WBC 16.0 (H) 05/24/2020   HGB 11.4 (L) 05/24/2020   HCT 35.9 (L) 05/24/2020   MCV 87.6 05/24/2020   PLT 282 05/24/2020    Lab Results  Component Value Date   CREATININE 0.73 05/24/2020   BUN 20 05/24/2020   NA 135 05/24/2020   K 4.4 05/24/2020   CL 102 05/24/2020   CO2 25 05/24/2020    Lab Results  Component Value Date   ALT 62 (H) 05/24/2020   AST 41 05/24/2020   ALKPHOS 107 05/24/2020   BILITOT 0.7 05/24/2020     Microbiology: Recent Results (from the past 240 hour(s))  Culture, blood (routine x 2)     Status: None   Collection Time: 05/17/20  9:42 AM   Specimen: BLOOD LEFT HAND  Result Value Ref Range Status   Specimen Description BLOOD LEFT HAND  Final   Special Requests   Final    BOTTLES DRAWN  AEROBIC ONLY Blood Culture adequate volume   Culture   Final    NO GROWTH 5 DAYS Performed at [facility], [address] [place], [place] [address]    Report Status 05/22/2020 FINAL  Final  Culture, blood (routine x 2)     Status: None   Collection Time: 05/17/20  9:55 AM   Specimen: BLOOD LEFT HAND  Result Value Ref Range Status   Specimen Description BLOOD LEFT HAND  Final   Special Requests   Final    BOTTLES DRAWN AEROBIC ONLY Blood Culture adequate volume   Culture   Final    NO GROWTH 5 DAYS Performed at [facility], [address] [place], [place] [address]    Report Status 05/22/2020 FINAL  Final  Culture, blood (routine x 2)     Status: None (Preliminary result)   Collection Time: 05/19/20 11:37 AM   Specimen: BLOOD  Result Value Ref Range Status   Specimen Description BLOOD LEFT ANTECUBITAL  Final   Special Requests   Final    BOTTLES DRAWN AEROBIC AND ANAEROBIC Blood Culture adequate volume   Culture   Final    NO GROWTH 4 DAYS Performed at [facility], [address], [place], [place] [address]    Report Status PENDING  Incomplete  Culture, blood (routine x 2)     Status: Abnormal   Collection Time: 05/19/20 11:37 AM   Specimen: BLOOD  Result Value Ref Range Status   Specimen Description BLOOD BLOOD LEFT HAND  Final   Special Requests   Final    BOTTLES DRAWN AEROBIC AND ANAEROBIC Blood Culture adequate volume   Culture  Setup Time   Final    GRAM POSITIVE COCCI IN CLUSTERS IN BOTH AEROBIC AND ANAEROBIC BOTTLES CRITICAL VALUE NOTED.  VALUE IS CONSISTENT WITH PREVIOUSLY REPORTED AND CALLED VALUE. Performed at [facility] Lab, [address] [place], [place] [address]    Culture METHICILLIN RESISTANT STAPHYLOCOCCUS AUREUS (A)  Final   Report Status 05/24/2020 FINAL  Final   Organism ID, Bacteria METHICILLIN RESISTANT STAPHYLOCOCCUS AUREUS  Final      Susceptibility   Methicillin resistant staphylococcus aureus - MIC*     CIPROFLOXACIN <=0.5 SENSITIVE Sensitive     ERYTHROMYCIN >=8 RESISTANT Resistant     GENTAMICIN <=0.5 SENSITIVE Sensitive     OXACILLIN >=4 RESISTANT Resistant  TETRACYCLINE <=1 SENSITIVE Sensitive     VANCOMYCIN 2 SENSITIVE Sensitive     TRIMETH/SULFA <=10 SENSITIVE Sensitive     CLINDAMYCIN <=0.25 SENSITIVE Sensitive     RIFAMPIN <=0.5 SENSITIVE Sensitive     Inducible Clindamycin NEGATIVE Sensitive     * METHICILLIN RESISTANT STAPHYLOCOCCUS AUREUS     Marcos Eke, NP [facility]  05/24/2020  10:33 AM

## 2020-05-24 NOTE — Progress Notes (Signed)
Nutrition Follow-up  RD working remotely.  DOCUMENTATION CODES:   Non-severe (moderate) malnutrition in context of social or environmental circumstances  INTERVENTION:   -Continue MVI with minerals daily -Continue Ensure Enlive po TID, each supplement provides 350 kcal and 20 grams of protein -Continue Magic cup TID with meals, each supplement provides 290 kcal and 9 grams of protein  NUTRITION DIAGNOSIS:   Moderate Malnutrition related to social / environmental circumstances as evidenced by energy intake < 75% for > or equal to 3 months, moderate fat depletion, moderate muscle depletion, percent weight loss.  Ongoing  GOAL:   Patient will meet greater than or equal to 90% of their needs  Progressing   MONITOR:   PO intake, Supplement acceptance, Labs, Weight trends  REASON FOR ASSESSMENT:   Consult Assessment of nutrition requirement/status  ASSESSMENT:   Pt admitted for multiple septic emboli suspicious for possible endocarditis in the setting of IV drug use resulting in sepsis. PMH significant for polysubstance abuse.  7/13 - s/p I&D of bilateral hand abscesses 7/16 - s/p TEE confirming TV vegetation and trace pulmonic valve vegetation 7/20 - s/p angiovac debridement of tricuspid valve 7/23- intubated 7/28- extubated 7/29- advanced to Heart Healthy diet  Reviewed I/O's: -1.9 L x 24 hours and -12.2 L since 05/10/20  UOP: 3.3 L x 24 hours  Attempted to speak with pt via hospital room phone, however, no answer.   Per ID notes, pt still not clearing bacteremia and will likely require longer dual anti MRSA therapy.   Intake has improved since last visit; noted meal completion 50-100%. Pt is accepting Ensure supplements.   Medications reviewed and include colace, lovenox, and miralax.   Labs reviewed: CBGS: 105-119.  Diet Order:   Diet Order            Diet Heart Room service appropriate? Yes; Fluid consistency: Thin  Diet effective now                  EDUCATION NEEDS:   Not appropriate for education at this time  Skin:  Skin Assessment: Skin Integrity Issues: Skin Integrity Issues:: Incisions Stage II: - Incisions: lt arm, rt arm, rt neck, rt groin  Last BM:  05/22/20  Height:   Ht Readings from Last 1 Encounters:  05/13/20 5\' 7"  (1.702 m)    Weight:   Wt Readings from Last 1 Encounters:  05/24/20 52.5 kg    Ideal Body Weight:     BMI:  Body mass index is 18.13 kg/m.  Estimated Nutritional Needs:   Kcal:  1850-2050  Protein:  105-120 grams  Fluid:  > 1.8 L    07/24/20, RD, LDN, CDCES Registered Dietitian II Certified Diabetes Care and Education Specialist Please refer to [facility] for RD and/or RD on-call/weekend/after hours pager

## 2020-05-24 NOTE — Progress Notes (Signed)
[facility] PROGRESS NOTE    Progress Note  [name]  UJW:[id number] DOB: 1991/07/12 DOA: 04/28/2020 PCP: Patient, [name]     Brief Narrative:   Clifford Clark is an 29 y.o. 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  2D echo showed an EF of 60% with a PFO and a large vegetation in the tricuspid valve.  He underwent AngioJet removal of part of the vegetation,repeat a 2D echo showed an EF of 20%.  Extubated on 05/19/2020.  05/03/2020 I&D of abscess.  Grew MSSA. 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  Next  05/11/2020 upper extremity Doppler showed no DVT.  05/13/2020 CTA of the chest without PE multifocal and septic emboli.  With multiple cavitary lesions consolidation and anasarca.  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  04/28/2020 blood cultures grew MRSA repeated blood cultures from 05/13/2020 + for MRSA, repeated blood cultures on 05/17/2020 are negative till date 05/03/2020 fungal cultures remain negative till date.  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 Status post debulking with angio vac on 05/10/2020 due to large vegetation. ID is on board and appreciate assistance. Repeated 2D echo was on 05/19/2020 that showed an EF of 30%, with persistent tricuspid vegetation measuring 1.8 x 0.8 cm-to the posterior leaflet.  With severe tricuspid regurgitation. ID recommended to continue to go longer with dual anti-MRSA therapy.  Acute respiratory failure with hypoxia: Extubated on 05/18/2020.  Septic shock requiring pressors: Secondary to MRSA bacteremia on admission started on pressors weaned, then off pressors and transfer to progressive unit on 05/20/2020. Continue current antibiotic regimen will need at least 6 weeks of antibiotics. Check CK per pharmacy protocol.  Acute systolic heart failure question septic cardiomyopathy: Initial 2D echo on admission showed an EF of 60%, repeat a 2D echo on 05/14/2020 showed a reduced EF of 20%.  He was started on IV Lasix with good diuresis, once he was close to euvolemia it was stopped. He was continued on Coreg, Entresto and Aldactone and continues to diurese nicely.  He is holding steady. Suspect drop in EF is due to septic shock. Continue to monitor blood pressure continue Coreg, Entresto and Aldactone. Cardiology related he may need Lasix as needed at DC.  As this point in time he continues to be negative and fluid is well managed with Aldactone.  Normocytic  anemia: Hemoglobin is now 10 appears to be stable. Signs of overt bleeding.  In the setting of infectious etiology.  HypoKalemia: Resolved  with oral repletion and Aldactone.  Substance abuse disorder: Patient admits to opiates and cocaine abuse. Will need counseling.  Bilateral wrist abscess: Orthopedic surgery was consulted and he was I&D on 05/03/2020 cultures grew MSSA. Suture removal by orthopedic surgery.  Malnutrition of moderate degree Ensure 3 times daily.  DVT prophylaxis: lovenox Family Communication:none Status is: Inpatient  Remains inpatient appropriate because:Hemodynamically unstable   Dispo: The patient is from: Home              Anticipated d/c is to: Home              Anticipated d/c date is: > 3 days              Patient currently is not medically stable to d/c.  Patient will need to finish his antibiotic regimen in house will be a total of 6 weeks.     Code Status:     Code Status Orders  (From admission, onward)         Start     Ordered   04/28/20 2123  Full code  Continuous        04/28/20 2122        Code Status History    Date Active Date Inactive Code Status Order ID Comments User Context   01/24/2019 1806 01/28/2019 1001 Full Code [id number]  Oneta Rack, NP Inpatient   01/24/2019 1451 01/24/2019 1752 Full Code [id number]  Raeford Razor, MD ED   01/25/2013 1812 01/26/2013 1256 Full Code [id number]  Desmond Dike ED   Advance Care Planning Activity        IV Access:    Peripheral IV   Procedures and diagnostic studies:   No results found.   Medical Consultants:    None.  Anti-Infectives:   Currently on daptomycin and Teflaro  Subjective:    Urijah Stanczyk relates no new complaints.  Objective:    Vitals:   05/24/20 0439 05/24/20 0500 05/24/20 0700 05/24/20 0817  BP: 100/71     Pulse: (!) 106     Resp: (!) 22 (!) 28 (!) 26 16  Temp: 98.7 F (37.1 C)   98.3 F (36.8 C)  TempSrc: Oral   Oral  SpO2: 97%   96%  Weight: 52.5 kg     Height:       SpO2: 96 % O2 Flow Rate (L/min): 2 L/min FiO2 (%): 40 %   Intake/Output Summary (Last 24 hours)  at 05/24/2020 0928 Last data filed at 05/24/2020 0824 Gross per 24 hour  Intake 1500 ml  Output 3000 ml  Net -1500 ml   Filed Weights   05/22/20 0400 05/23/20 0000 05/24/20 0439  Weight: 52.6 kg 53.4 kg 52.5 kg    Exam: General exam: In no acute distress, cachectic Respiratory system: Good air movement and clear to auscultation. Cardiovascular system: S1 & S2 heard, RRR. No JVD. Gastrointestinal system: Abdomen is nondistended, soft and nontender.  Extremities: No pedal edema. Skin: No rashes, lesions or ulcers Psychiatry: Judgement and insight appear normal. Mood & affect appropriate.   Data Reviewed:    Labs: Basic Metabolic Panel: Recent Labs  Lab 05/18/20 0344 05/19/20 0231 05/20/20 0017 05/20/20 4944 05/21/20 9675 05/21/20 0733 05/22/20 0729 05/22/20 0729 05/23/20 0509 05/24/20 0726  NA 141   < > 139  --  135  --  137  --  137 135  K 3.8   < > 4.1   < > 4.2   < > 4.3   < > 4.4 4.4  CL 100   < > 106  --  104  --  104  --  102 102  CO2 32   < > 24  --  22  --  25  --  24 25  GLUCOSE 114*   < > 136*  --  110*  --  102*  --  93 102*  BUN 16   < > 8  --  10  --  11  --  15 20  CREATININE 0.51*   < > 0.63  --  0.69  --  0.67  --  0.78 0.73  CALCIUM 8.2*   < > 8.2*  --  8.4*  --  9.0  --  9.3 9.4  MG 1.9  --  2.0  --   --   --   --   --   --   --    < > = values in this interval not displayed.   GFR Estimated Creatinine Clearance: 100.3 mL/min (by C-G formula based on SCr of 0.73 mg/dL). Liver Function Tests: Recent Labs  Lab 05/20/20 0835 05/21/20 0733 05/22/20 0729 05/23/20 0509 05/24/20 0726  AST 21 26 31  37 41  ALT 21 30 39 49* 62*  ALKPHOS 112 98 104 105 107  BILITOT 0.5 0.6 0.5 0.6 0.7  PROT 6.9 7.1 7.8 8.1 8.0  ALBUMIN 1.9* 2.1* 2.4* 2.7* 2.7*   No results for input(s): LIPASE, AMYLASE in the last 168 hours. No results for input(s): AMMONIA in the last 168 hours. Coagulation profile No results for input(s): INR, PROTIME in the last 168  hours. COVID-19 Labs  No results for input(s): DDIMER, FERRITIN, LDH, CRP in the last 72 hours.  Lab Results  Component Value Date   SARSCOV2NAA NEGATIVE 04/28/2020    CBC: Recent Labs  Lab 05/20/20 0835 05/21/20 0733 05/22/20 0729 05/23/20 0509 05/24/20 0726  WBC 15.1* 18.5* 17.9* 16.8* 16.0*  HGB 10.7* 10.9* 10.9* 11.0* 11.4*  HCT 34.2* 34.5* 34.0* 34.7* 35.9*  MCV 87.0 85.6 86.3 88.5 87.6  PLT 274 283 276 284 282   Cardiac Enzymes: Recent Labs  Lab 05/18/20 1344  CKTOTAL 30*   BNP (last 3 results) No results for input(s): PROBNP in the last 8760 hours. CBG: Recent Labs  Lab 05/22/20 2004 05/23/20 0008 05/23/20 0404 05/23/20 0758 05/24/20 0504  GLUCAP 143* 103* 110* 119* 105*   D-Dimer: No results for input(s): DDIMER in the last 72 hours. Hgb A1c: No results for input(s): HGBA1C in the last 72 hours. Lipid Profile: No results for input(s): CHOL, HDL, LDLCALC, TRIG, CHOLHDL, LDLDIRECT in the last 72 hours. Thyroid function studies: No results for input(s): TSH, T4TOTAL, T3FREE, THYROIDAB in the last 72 hours.  Invalid input(s): FREET3 Anemia work up: No results for input(s): VITAMINB12, FOLATE, FERRITIN, TIBC, IRON, RETICCTPCT in the last 72 hours. Sepsis Labs: Recent Labs  Lab 05/21/20 0733 05/22/20 0729 05/23/20 0509 05/24/20 0726  WBC 18.5* 17.9* 16.8* 16.0*   Microbiology Recent Results (from the past 240 hour(s))  Culture, blood (routine x 2)     Status: None   Collection Time: 05/17/20  9:42 AM   Specimen: BLOOD LEFT HAND  Result Value Ref Range Status   Specimen Description BLOOD LEFT HAND  Final   Special Requests   Final  BOTTLES DRAWN AEROBIC ONLY Blood Culture adequate volume   Culture   Final    NO GROWTH 5 DAYS Performed at [facility], [address], [place], [place] [address]    Report Status 05/22/2020 FINAL  Final  Culture, blood (routine x 2)     Status: None   Collection Time: 05/17/20  9:55 AM   Specimen:  BLOOD LEFT HAND  Result Value Ref Range Status   Specimen Description BLOOD LEFT HAND  Final   Special Requests   Final    BOTTLES DRAWN AEROBIC ONLY Blood Culture adequate volume   Culture   Final    NO GROWTH 5 DAYS Performed at [facility], [address] [place], [place] [address]    Report Status 05/22/2020 FINAL  Final  Culture, blood (routine x 2)     Status: None (Preliminary result)   Collection Time: 05/19/20 11:37 AM   Specimen: BLOOD  Result Value Ref Range Status   Specimen Description BLOOD LEFT ANTECUBITAL  Final   Special Requests   Final    BOTTLES DRAWN AEROBIC AND ANAEROBIC Blood Culture adequate volume   Culture   Final    NO GROWTH 4 DAYS Performed at [facility] Lab, [address] [place], [place] [address]    Report Status PENDING  Incomplete  Culture, blood (routine x 2)     Status: Abnormal   Collection Time: 05/19/20 11:37 AM   Specimen: BLOOD  Result Value Ref Range Status   Specimen Description BLOOD BLOOD LEFT HAND  Final   Special Requests   Final    BOTTLES DRAWN AEROBIC AND ANAEROBIC Blood Culture adequate volume   Culture  Setup Time   Final    GRAM POSITIVE COCCI IN CLUSTERS IN BOTH AEROBIC AND ANAEROBIC BOTTLES CRITICAL VALUE NOTED.  VALUE IS CONSISTENT WITH PREVIOUSLY REPORTED AND CALLED VALUE. Performed at [facility], [address], [place], [place] [address]    Culture METHICILLIN RESISTANT STAPHYLOCOCCUS AUREUS (A)  Final   Report Status 05/24/2020 FINAL  Final   Organism ID, Bacteria METHICILLIN RESISTANT STAPHYLOCOCCUS AUREUS  Final      Susceptibility   Methicillin resistant staphylococcus aureus - MIC*    CIPROFLOXACIN <=0.5 SENSITIVE Sensitive     ERYTHROMYCIN >=8 RESISTANT Resistant     GENTAMICIN <=0.5 SENSITIVE Sensitive     OXACILLIN >=4 RESISTANT Resistant     TETRACYCLINE <=1 SENSITIVE Sensitive     VANCOMYCIN 2 SENSITIVE Sensitive     TRIMETH/SULFA <=10 SENSITIVE Sensitive     CLINDAMYCIN <=0.25  SENSITIVE Sensitive     RIFAMPIN <=0.5 SENSITIVE Sensitive     Inducible Clindamycin NEGATIVE Sensitive     * METHICILLIN RESISTANT STAPHYLOCOCCUS AUREUS     Medications:   . carvedilol  3.125 mg Oral BID WC  . Chlorhexidine Gluconate Cloth  6 each Topical Daily  . docusate sodium  100 mg Oral BID  . enoxaparin (LOVENOX) injection  40 mg Subcutaneous Q24H  . feeding supplement (ENSURE ENLIVE)  237 mL Oral TID BM  . mouth rinse  15 mL Mouth Rinse BID  . multivitamin with minerals  1 tablet Oral Daily  . pantoprazole  40 mg Oral Daily  . polyethylene glycol  17 g Oral Daily  . potassium chloride  40 mEq Oral Q12H  . sacubitril-valsartan  1 tablet Oral BID  . sodium chloride flush  3 mL Intravenous Q12H  . spironolactone  25 mg Oral Daily  Continuous Infusions: . sodium chloride Stopped (05/18/20 1552)  . ceFTAROline (TEFLARO) IV 600 mg (05/24/20 0454)  . DAPTOmycin (CUBICIN)  IV 500 mg (05/23/20 2003)      LOS: 26 days   Marinda ElkAbraham Feliz Ortiz  [facility]  05/24/2020, 9:28 AM

## 2020-05-24 NOTE — Progress Notes (Signed)
   05/24/20 1137  Assess: MEWS Score  Temp 98.5 F (36.9 C) (axillary)  BP 97/68  Pulse Rate (!) 112  ECG Heart Rate (!) 113  Resp 20  Level of Consciousness Alert  SpO2 97 %  O2 Device Room Air  Assess: MEWS Score  MEWS Temp 0  MEWS Systolic 1  MEWS Pulse 2  MEWS RR 0  MEWS LOC 0  MEWS Score 3  MEWS Score Color Yellow  Assess: if the MEWS score is Yellow or Red  Were vital signs taken at a resting state? Yes  Focused Assessment No change from prior assessment  Early Detection of Sepsis Score *See Row Information* Low  MEWS guidelines implemented *See Row Information* No, previously yellow, continue vital signs every 4 hours  Treat  MEWS Interventions Other (Comment)   Unable to retrieve oral temp from hand held thermometer and from dinamap. Patient relayed he just ate ice cream. Will recheck oral temp within the hour. Patient has no complaints of any pain or trouble breathing.

## 2020-05-24 NOTE — Progress Notes (Signed)
     [address]       [name] [address]             [phone]       Pt will be reassessed as an outpatient once he has completed his drug rehab program for tricuspid valve repair.  Racheal Mathurin Keane Scrape

## 2020-05-25 LAB — CULTURE, BLOOD (ROUTINE X 2): Special Requests: ADEQUATE

## 2020-05-25 LAB — COMPREHENSIVE METABOLIC PANEL
ALT: 114 U/L — ABNORMAL HIGH (ref 0–44)
AST: 84 U/L — ABNORMAL HIGH (ref 15–41)
Albumin: 2.7 g/dL — ABNORMAL LOW (ref 3.5–5.0)
Alkaline Phosphatase: 95 U/L (ref 38–126)
Anion gap: 10 (ref 5–15)
BUN: 22 mg/dL — ABNORMAL HIGH (ref 6–20)
CO2: 25 mmol/L (ref 22–32)
Calcium: 9.5 mg/dL (ref 8.9–10.3)
Chloride: 99 mmol/L (ref 98–111)
Creatinine, Ser: 0.8 mg/dL (ref 0.61–1.24)
GFR calc Af Amer: >60 mL/min (ref >60)
GFR calc non Af Amer: >60 mL/min (ref >60)
Glucose, Bld: 95 mg/dL (ref 70–99)
Potassium: 4.6 mmol/L (ref 3.5–5.1)
Sodium: 134 mmol/L — ABNORMAL LOW (ref 135–145)
Total Bilirubin: 0.4 mg/dL (ref 0.3–1.2)
Total Protein: 7.6 g/dL (ref 6.5–8.1)

## 2020-05-25 LAB — GLUCOSE, CAPILLARY
Glucose-Capillary: 103 mg/dL — ABNORMAL HIGH (ref 70–99)
Glucose-Capillary: 121 mg/dL — ABNORMAL HIGH (ref 70–99)
Glucose-Capillary: 124 mg/dL — ABNORMAL HIGH (ref 70–99)
Glucose-Capillary: 128 mg/dL — ABNORMAL HIGH (ref 70–99)
Glucose-Capillary: 165 mg/dL — ABNORMAL HIGH (ref 70–99)

## 2020-05-25 LAB — CBC
HCT: 34.1 % — ABNORMAL LOW (ref 39.0–52.0)
Hemoglobin: 10.8 g/dL — ABNORMAL LOW (ref 13.0–17.0)
MCH: 27.6 pg (ref 26.0–34.0)
MCHC: 31.7 g/dL (ref 30.0–36.0)
MCV: 87 fL (ref 80.0–100.0)
Platelets: 306 10*3/uL (ref 150–400)
RBC: 3.92 MIL/uL — ABNORMAL LOW (ref 4.22–5.81)
RDW: 15.4 % (ref 11.5–15.5)
WBC: 13.8 10*3/uL — ABNORMAL HIGH (ref 4.0–10.5)
nRBC: 0 % (ref 0.0–0.2)

## 2020-05-25 LAB — CK: Total CK: 8 U/L — ABNORMAL LOW (ref 49–397)

## 2020-05-25 MED ORDER — LACTATED RINGERS IV BOLUS
500.0000 mL | Freq: Once | INTRAVENOUS | Status: AC
  Administered 2020-05-25: 500 mL via INTRAVENOUS

## 2020-05-25 NOTE — Progress Notes (Signed)
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 s/p angiovac by CVTS 7/20. Intubated 7/23-7/28. PMHx: IVDU, asthma    PT Comments    Pt admitted with above diagnosis. Pt was able to ambulate with min guard to supervision in hallway with pt still using RW for stability as he does not feel he can ambulate without RW yet. Pt progressing and no LOB today with RW.  Will continue to progress pt as he tolerates. HR better today.  Pt currently with functional limitations due to balance and endurance deficits. Pt will benefit from skilled PT to increase their independence and safety with mobility to allow discharge to the venue listed below.     Follow Up Recommendations  Home health PT;Supervision/Assistance - 24 hour     Equipment Recommendations  Rolling walker with 5" wheels    Recommendations for Other Services OT consult     Precautions / Restrictions Precautions Precautions: Fall Restrictions Weight Bearing Restrictions: No    Mobility  Bed Mobility Overal bed mobility: Needs Assistance Bed Mobility: Supine to Sit;Sit to Supine     Supine to sit: Supervision Sit to supine: Supervision      Transfers Overall transfer level: Needs assistance Equipment used: Rolling walker (2 wheeled) Transfers: Sit to/from [employer] Sit to Stand: Min guard         General transfer comment: No assist to stand to RW today but did need cues for hand placement  Ambulation/Gait Ambulation/Gait assistance: Supervision Gait Distance (Feet): 450 Feet Assistive device: Rolling walker (2 wheeled) Gait Pattern/deviations: Step-through pattern;Decreased stride length;Narrow base of support   Gait velocity interpretation: 1.31 - 2.62  ft/sec, indicative of limited community ambulator General Gait Details: pt with short steps with narrow BOS, incr distance and greater stability with RW use.     Stairs             Wheelchair Mobility    Modified Rankin (Stroke Patients Only)       Balance Overall balance assessment: Needs assistance;History of Falls Sitting-balance support: No upper extremity supported;Feet supported Sitting balance-Leahy Scale: Fair Sitting balance - Comments: pt able to sit EOB but LOB with donning socks   Standing balance support: Bilateral upper extremity supported;During functional activity Standing balance-Leahy Scale: Poor Standing balance comment: physical assist for balance in standing as well as UEs upport                            Cognition Arousal/Alertness: Awake/alert Behavior During Therapy: Flat affect Overall Cognitive Status: Impaired/Different from baseline Area of Impairment: Orientation;Attention;Safety/judgement;Awareness;Problem solving                 Orientation Level: Disoriented to;Time Current Attention Level: Sustained     Safety/Judgement: Decreased awareness of safety;Decreased awareness of deficits Awareness: Emergent Problem Solving: Requires verbal cues        Exercises      General Comments General comments (skin integrity, edema, etc.): VSS with HR 110-128 bpm.      Pertinent Vitals/Pain Pain Assessment: No/denies pain    Home Living                      Prior Function  PT Goals (current goals can now be found in the care plan section) Acute Rehab PT Goals Patient Stated Goal: return to independence/drug rehab Progress towards PT goals: Progressing toward goals    Frequency    Min 3X/week      PT Plan Current plan remains appropriate    Co-evaluation              AM-PAC PT "6 Clicks" Mobility   Outcome Measure  Help needed turning from your back to your side while in a  flat bed without using bedrails?: A Little Help needed moving from lying on your back to sitting on the side of a flat bed without using bedrails?: A Little Help needed moving to and from a bed to a chair (including a wheelchair)?: A Little Help needed standing up from a chair using your arms (e.g., wheelchair or bedside chair)?: A Little Help needed to walk in hospital room?: A Little Help needed climbing 3-5 steps with a railing? : A Lot 6 Click Score: 17    End of Session Equipment Utilized During Treatment: Gait belt Activity Tolerance: Patient tolerated treatment well Patient left: with call bell/phone within reach;in bed;with bed alarm set Nurse Communication: Mobility status PT Visit Diagnosis: Other abnormalities of gait and mobility (R26.89);Difficulty in walking, not elsewhere classified (R26.2)     Time: 4825-0037 PT Time Calculation (min) (ACUTE ONLY): 16 min  Charges:  $Gait Training: 8-22 mins                     Clifford Clark,PT Acute Rehabilitation Services Pager:  [phone]  Office:  [phone]     Clifford Clark 05/25/2020, 12:04 PM

## 2020-05-25 NOTE — Plan of Care (Signed)
  Problem: Education: Goal: Knowledge of General Education information will improve Description Including pain rating scale, medication(s)/side effects and non-pharmacologic comfort measures Outcome: Progressing   

## 2020-05-25 NOTE — Progress Notes (Signed)
PROGRESS NOTE    Clifford Clark  WCH:[id number] DOB: November 19, 1990 DOA: 04/28/2020 PCP: Patient, [name]   Brief Narrative:   Clifford Clark is an 29 y.o. 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  2D echo showed an EF of 60% with a PFO and a large vegetation in the tricuspid valve.  He underwent AngioJet removal of part of the vegetation,repeat a 2D echo showed an EF of 20%.  Extubated on 05/19/2020.  8/4: He denies complaints today. Continue dapto/doxy. Follow Bld Cx.    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     - s/p debulking with angio vac on 05/10/2020 due to large vegetation.     - Rpt 2D echo was on 05/19/2020 that showed an EF of 30%, with persistent tricuspid vegetation measuring 1.8 x 0.8 cm-to the posterior leaflet with severe tricuspid regurgitation.     - CTS has seen, will f/u outpt for consideration of TV replacement after he has completed drug-rehab     - ID has transitioned him to dapt/doxy     - rpt Bld Cx (8/2) showing bacillus; await ID recs  Acute respiratory failure with hypoxia     - required intubation; extubated on 05/18/2020     - He is stable on RA  Septic shock requiring pressors:     - Secondary to MRSA bacteremia on admission started on pressors weaned, then off pressors and transfer to progressive unit on 05/20/2020.     - Continue current dapto/doxy; will need at least 6 weeks  of antibiotics.     - follow CPK per pharm protocol  Acute systolic heart failure question septic cardiomyopathy     - Initial 2D echo on admission showed an EF of 60%     - repeat a 2D echo on 05/14/2020 showed a reduced EF of 20%.       - He was started on IV Lasix with good diuresis, once he was close to euvolemia it was stopped.     - coreg, entresto, aldactone     - follow I&O, daily wts  Normocytic anemia:     - Hgb 10.8; stable, no signs of bleed, follow  Hypolalemia:     - resolved, follow K+  Substance abuse disorder:     - Patient admits to opiates and cocaine abuse.     - needs outpt counseling, rehab  Bilateral wrist abscess (MSSA):     - Orthopedic surgery was consulted and he was I&D on 05/03/2020 cultures grew MSSA.     - Suture removal by orthopedic surgery.  Malnutrition of moderate degree     - Ensure TID  DVT prophylaxis: lovenox Code Status: FULL Family Communication: None at the bedside   Status is: Inpatient  Remains inpatient appropriate because:Inpatient level of care appropriate due to severity of illness   Dispo: The patient is from: Home  Anticipated d/c is to: Home              Anticipated d/c date is: > 3 days              Patient currently is not medically stable to d/c.  Consultants:   ID  CTS  Cardiology  Pulmonology/CC  Orthopedics  Antimicrobials:  . Daptomycin, doxycycline   ROS:  Denies CP, N, V, dyspnea . Remainder ROS is negative for all not previously mentioned.  Subjective: "I am giving that up."  Objective: Vitals:   05/25/20 0700 05/25/20 0909 05/25/20 0922 05/25/20 1022  BP:  98/69 94/61 103/73  Pulse:  (!) 113    Resp: (!) 26 (!) 24    Temp:  98.4 F (36.9 C) 98.4 F (36.9 C) 98.6 F (37 C)  TempSrc:  Oral    SpO2:  99%    Weight:      Height:        Intake/Output Summary (Last 24 hours) at 05/25/2020 1556 Last data filed at 05/25/2020 4540 Gross per 24 hour  Intake 720 ml  Output  1850 ml  Net -1130 ml   Filed Weights   05/23/20 0000 05/24/20 0439 05/25/20 0359  Weight: 53.4 kg 52.5 kg 53.2 kg    Examination:  General: 29 y.o. male resting in bed in NAD Cardiovascular: tachy, +S1, S2, no m/g/r, equal pulses throughout Respiratory: CTABL, no w/r/r, normal WOB GI: BS+, NDNT, no masses noted, no organomegaly noted MSK: No e/c/c Neuro: A&O x 3, no focal deficits Psyc: Appropriate interaction and affect, calm/cooperative   Data Reviewed: I have personally reviewed following labs and imaging studies.  CBC: Recent Labs  Lab 05/21/20 0733 05/22/20 0729 05/23/20 0509 05/24/20 0726 05/25/20 0555  WBC 18.5* 17.9* 16.8* 16.0* 13.8*  HGB 10.9* 10.9* 11.0* 11.4* 10.8*  HCT 34.5* 34.0* 34.7* 35.9* 34.1*  MCV 85.6 86.3 88.5 87.6 87.0  PLT 283 276 284 282 306   Basic Metabolic Panel: Recent Labs  Lab 05/20/20 0835 05/20/20 0835 05/21/20 0733 05/22/20 0729 05/23/20 0509 05/24/20 0726 05/25/20 0555  NA 139   < > 135 137 137 135 134*  K 4.1   < > 4.2 4.3 4.4 4.4 4.6  CL 106   < > 104 104 102 102 99  CO2 24   < > 22 25 24 25 25   GLUCOSE 136*   < > 110* 102* 93 102* 95  BUN 8   < > 10 11 15 20  22*  CREATININE 0.63   < > 0.69 0.67 0.78 0.73 0.80  CALCIUM 8.2*   < > 8.4* 9.0 9.3 9.4 9.5  MG 2.0  --   --   --   --   --   --    < > = values in this interval not displayed.   GFR: Estimated Creatinine Clearance: 101.6 mL/min (by C-G formula based on SCr of 0.8 mg/dL). Liver Function Tests: Recent Labs  Lab 05/21/20 0733 05/22/20 0729 05/23/20 0509 05/24/20 0726 05/25/20 0555  AST 26 31 37 41 84*  ALT 30 39 49* 62* 114*  ALKPHOS 98 104 105 107 95  BILITOT 0.6 0.5 0.6 0.7 0.4  PROT 7.1 7.8 8.1 8.0 7.6  ALBUMIN 2.1* 2.4* 2.7* 2.7* 2.7*   No results for input(s): LIPASE, AMYLASE in the last 168 hours. No results for input(s): AMMONIA in the last 168 hours. Coagulation Profile: No results for input(s): INR, PROTIME in the last 168 hours. Cardiac  Enzymes: Recent Labs  Lab 05/25/20 0555  CKTOTAL 8*   BNP (last 3 results) No results for input(s): PROBNP in the last 8760 hours. HbA1C: No results for input(s): HGBA1C in the last 72 hours. CBG: Recent Labs  Lab 05/24/20 0504 05/24/20 1958 05/25/20 0007 05/25/20 0358 05/25/20 0748  GLUCAP 105*  105* 141* 121* 103* 124*   Lipid Profile: No results for input(s): CHOL, HDL, LDLCALC, TRIG, CHOLHDL, LDLDIRECT in the last 72 hours. Thyroid Function Tests: No results for input(s): TSH, T4TOTAL, FREET4, T3FREE, THYROIDAB in the last 72 hours. Anemia Panel: No results for input(s): VITAMINB12, FOLATE, FERRITIN, TIBC, IRON, RETICCTPCT in the last 72 hours. Sepsis Labs: No results for input(s): PROCALCITON, LATICACIDVEN in the last 168 hours.  Recent Results (from the past 240 hour(s))  Culture, blood (routine x 2)     Status: None   Collection Time: 05/17/20  9:42 AM   Specimen: BLOOD LEFT HAND  Result Value Ref Range Status   Specimen Description BLOOD LEFT HAND  Final   Special Requests   Final    BOTTLES DRAWN AEROBIC ONLY Blood Culture adequate volume   Culture   Final    NO GROWTH 5 DAYS Performed at [facility], [address] [place], [place] [address]    Report Status 05/22/2020 FINAL  Final  Culture, blood (routine x 2)     Status: None   Collection Time: 05/17/20  9:55 AM   Specimen: BLOOD LEFT HAND  Result Value Ref Range Status   Specimen Description BLOOD LEFT HAND  Final   Special Requests   Final    BOTTLES DRAWN AEROBIC ONLY Blood Culture adequate volume   Culture   Final    NO GROWTH 5 DAYS Performed at [facility], [address] [place], [place] [address]    Report Status 05/22/2020 FINAL  Final  Culture, blood (routine x 2)     Status: None   Collection Time: 05/19/20 11:37 AM   Specimen: BLOOD  Result Value Ref Range Status   Specimen Description BLOOD LEFT ANTECUBITAL  Final   Special Requests   Final    BOTTLES DRAWN AEROBIC  AND ANAEROBIC Blood Culture adequate volume   Culture   Final    NO GROWTH 5 DAYS Performed at [facility], [address] [place], [place] [address]    Report Status 05/24/2020 FINAL  Final  Culture, blood (routine x 2)     Status: Abnormal   Collection Time: 05/19/20 11:37 AM   Specimen: BLOOD  Result Value Ref Range Status   Specimen Description BLOOD BLOOD LEFT HAND  Final   Special Requests   Final    BOTTLES DRAWN AEROBIC AND ANAEROBIC Blood Culture adequate volume   Culture  Setup Time   Final    GRAM POSITIVE COCCI IN CLUSTERS IN BOTH AEROBIC AND ANAEROBIC BOTTLES CRITICAL VALUE NOTED.  VALUE IS CONSISTENT WITH PREVIOUSLY REPORTED AND CALLED VALUE. Performed at [facility], [address] [place], [place] [address]    Culture METHICILLIN RESISTANT STAPHYLOCOCCUS AUREUS (A)  Final   Report Status 05/24/2020 FINAL  Final   Organism ID, Bacteria METHICILLIN RESISTANT STAPHYLOCOCCUS AUREUS  Final      Susceptibility   Methicillin resistant staphylococcus aureus - MIC*    CIPROFLOXACIN <=0.5 SENSITIVE Sensitive     ERYTHROMYCIN >=8 RESISTANT Resistant     GENTAMICIN <=0.5 SENSITIVE Sensitive     OXACILLIN >=4 RESISTANT Resistant  TETRACYCLINE <=1 SENSITIVE Sensitive     VANCOMYCIN 2 SENSITIVE Sensitive     TRIMETH/SULFA <=10 SENSITIVE Sensitive     CLINDAMYCIN <=0.25 SENSITIVE Sensitive     RIFAMPIN <=0.5 SENSITIVE Sensitive     Inducible Clindamycin NEGATIVE Sensitive     * METHICILLIN RESISTANT STAPHYLOCOCCUS AUREUS  Culture, blood (routine x 2)     Status: None (Preliminary result)   Collection Time: 05/23/20  9:16 AM   Specimen: BLOOD LEFT HAND  Result Value Ref Range Status   Specimen Description BLOOD LEFT HAND  Final   Special Requests   Final    BOTTLES DRAWN AEROBIC ONLY Blood Culture adequate volume   Culture   Final    NO GROWTH 2 DAYS Performed at [facility], [address] [place], [place] [address]    Report Status PENDING   Incomplete  Culture, blood (routine x 2)     Status: Abnormal   Collection Time: 05/23/20  9:16 AM   Specimen: BLOOD LEFT HAND  Result Value Ref Range Status   Specimen Description BLOOD LEFT HAND  Final   Special Requests   Final    BOTTLES DRAWN AEROBIC ONLY Blood Culture adequate volume   Culture  Setup Time   Final    GRAM POSITIVE RODS AEROBIC BOTTLE ONLY CRITICAL RESULT CALLED TO, READ BACK BY AND VERIFIED WITH: PHARMD A WOLFE CALLED 8 3 21  AT 1447 SK    Culture (A)  Final    BACILLUS SPECIES Standardized susceptibility testing for this organism is not available. Performed at [facility], [address] [place], [place] [address]    Report Status 05/25/2020 FINAL  Final      Radiology Studies: No results found.   Scheduled Meds: . carvedilol  3.125 mg Oral BID WC  . Chlorhexidine Gluconate Cloth  6 each Topical Daily  . docusate sodium  100 mg Oral BID  . doxycycline  100 mg Oral Q12H  . enoxaparin (LOVENOX) injection  40 mg Subcutaneous Q24H  . feeding supplement (ENSURE ENLIVE)  237 mL Oral TID BM  . mouth rinse  15 mL Mouth Rinse BID  . multivitamin with minerals  1 tablet Oral Daily  . pantoprazole  40 mg Oral Daily  . polyethylene glycol  17 g Oral Daily  . potassium chloride  40 mEq Oral Q12H  . sacubitril-valsartan  1 tablet Oral BID  . sodium chloride flush  3 mL Intravenous Q12H  . spironolactone  25 mg Oral Daily   Continuous Infusions: . sodium chloride Stopped (05/18/20 1552)  . DAPTOmycin (CUBICIN)  IV 500 mg (05/24/20 2015)     LOS: 27 days    Time spent: 35 minutes spent in the coordination of care today.    Teddy Spikeyrone A Willford Rabideau, DO [facility]  If 7PM-7AM, please contact night-coverage [email or web address] 05/25/2020, 3:56 PM

## 2020-05-25 NOTE — Progress Notes (Signed)
   05/24/20 1950  Assess: MEWS Score  Temp 99.1 F (37.3 C)  BP (!) 101/45  Pulse Rate (!) 120  ECG Heart Rate (!) 120  Resp (!) 24  Level of Consciousness Alert  SpO2 99 %  O2 Device Room Air  Assess: MEWS Score  MEWS Temp 0  MEWS Systolic 0  MEWS Pulse 2  MEWS RR 1  MEWS LOC 0  MEWS Score 3  MEWS Score Color Yellow  Assess: if the MEWS score is Yellow or Red  Were vital signs taken at a resting state? Yes  Focused Assessment No change from prior assessment  Early Detection of Sepsis Score *See Row Information* Low  MEWS guidelines implemented *See Row Information* No, previously yellow, continue vital signs every 4 hours  Patient alert and oriented remains in yellow mews ongoing from previous shift. Will continue vital signs every four hours.

## 2020-05-25 NOTE — Progress Notes (Addendum)
   05/25/20 0909  Assess: MEWS Score  Temp 98.4 F (36.9 C)  BP 98/69  Pulse Rate (!) 113  Resp (!) 24  Level of Consciousness Alert  SpO2 99 %  O2 Device Room Air  Patient Activity (if Appropriate) In bed  Assess: MEWS Score  MEWS Temp 0  MEWS Systolic 1  MEWS Pulse 2  MEWS RR 1  MEWS LOC 0  MEWS Score 4  MEWS Score Color Red  Assess: if the MEWS score is Yellow or Red  Were vital signs taken at a resting state? Yes  Focused Assessment No change from prior assessment  Early Detection of Sepsis Score *See Row Information* Low  MEWS guidelines implemented *See Row Information* No, previously yellow, continue vital signs every 4 hours  Treat  MEWS Interventions Administered prn meds/treatments;Escalated (See documentation below)  Complains of Agitation  Neuro symptoms relieved by Rest  Notify: Rapid Response  Name of Rapid Response RN Notified  (n/a)  Document  Patient Outcome Stabilized after interventions  Progress note created (see row info) Yes  chronic low b/p tachy HR and RR PRNs given for Pain and HR

## 2020-05-26 LAB — RENAL FUNCTION PANEL
Albumin: 2.8 g/dL — ABNORMAL LOW (ref 3.5–5.0)
Anion gap: 11 (ref 5–15)
BUN: 22 mg/dL — ABNORMAL HIGH (ref 6–20)
CO2: 28 mmol/L (ref 22–32)
Calcium: 9.6 mg/dL (ref 8.9–10.3)
Chloride: 98 mmol/L (ref 98–111)
Creatinine, Ser: 0.77 mg/dL (ref 0.61–1.24)
GFR calc Af Amer: >60 mL/min (ref >60)
GFR calc non Af Amer: >60 mL/min (ref >60)
Glucose, Bld: 103 mg/dL — ABNORMAL HIGH (ref 70–99)
Phosphorus: 4.3 mg/dL (ref 2.5–4.6)
Potassium: 4.5 mmol/L (ref 3.5–5.1)
Sodium: 137 mmol/L (ref 135–145)

## 2020-05-26 LAB — CBC WITH DIFFERENTIAL/PLATELET
Abs Immature Granulocytes: 0.18 10*3/uL — ABNORMAL HIGH (ref 0.00–0.07)
Basophils Absolute: 0.2 10*3/uL — ABNORMAL HIGH (ref 0.0–0.1)
Basophils Relative: 2 %
Eosinophils Absolute: 0.1 10*3/uL (ref 0.0–0.5)
Eosinophils Relative: 1 %
HCT: 33.5 % — ABNORMAL LOW (ref 39.0–52.0)
Hemoglobin: 10.6 g/dL — ABNORMAL LOW (ref 13.0–17.0)
Immature Granulocytes: 1 %
Lymphocytes Relative: 20 %
Lymphs Abs: 2.6 10*3/uL (ref 0.7–4.0)
MCH: 27.5 pg (ref 26.0–34.0)
MCHC: 31.6 g/dL (ref 30.0–36.0)
MCV: 87 fL (ref 80.0–100.0)
Monocytes Absolute: 1 10*3/uL (ref 0.1–1.0)
Monocytes Relative: 8 %
Neutro Abs: 8.6 10*3/uL — ABNORMAL HIGH (ref 1.7–7.7)
Neutrophils Relative %: 68 %
Platelets: 332 10*3/uL (ref 150–400)
RBC: 3.85 MIL/uL — ABNORMAL LOW (ref 4.22–5.81)
RDW: 15.4 % (ref 11.5–15.5)
WBC: 12.7 10*3/uL — ABNORMAL HIGH (ref 4.0–10.5)
nRBC: 0 % (ref 0.0–0.2)

## 2020-05-26 LAB — GLUCOSE, CAPILLARY
Glucose-Capillary: 109 mg/dL — ABNORMAL HIGH (ref 70–99)
Glucose-Capillary: 122 mg/dL — ABNORMAL HIGH (ref 70–99)
Glucose-Capillary: 130 mg/dL — ABNORMAL HIGH (ref 70–99)
Glucose-Capillary: 87 mg/dL (ref 70–99)
Glucose-Capillary: 88 mg/dL (ref 70–99)
Glucose-Capillary: 90 mg/dL (ref 70–99)

## 2020-05-26 LAB — MAGNESIUM: Magnesium: 2.2 mg/dL (ref 1.7–2.4)

## 2020-05-26 MED ORDER — LACTATED RINGERS IV BOLUS
500.0000 mL | Freq: Once | INTRAVENOUS | Status: AC
  Administered 2020-05-26: 500 mL via INTRAVENOUS

## 2020-05-26 MED ORDER — CARVEDILOL 3.125 MG PO TABS
3.1250 mg | ORAL_TABLET | Freq: Two times a day (BID) | ORAL | Status: DC
  Administered 2020-05-26 – 2020-05-27 (×3): 3.125 mg via ORAL
  Filled 2020-05-26 (×4): qty 1

## 2020-05-26 NOTE — Progress Notes (Signed)
   05/26/20 2055  Assess: MEWS Score  Pulse Rate (!) 117  ECG Heart Rate (!) 118  Resp (!) 22  SpO2 97 %  Assess: MEWS Score  MEWS Temp 0  MEWS Systolic 0  MEWS Pulse 2  MEWS RR 1  MEWS LOC 0  MEWS Score 3  MEWS Score Color Yellow

## 2020-05-26 NOTE — Progress Notes (Signed)
°   05/26/20 2040  Assess: MEWS Score  Temp 98 F (36.7 C)  BP 102/71  Pulse Rate (!) 117  ECG Heart Rate (!) 117  Resp (!) 30  SpO2 97 %  O2 Device Room Air  Patient Activity (if Appropriate) In bed  Assess: MEWS Score  MEWS Temp 0  MEWS Systolic 0  MEWS Pulse 2  MEWS RR 2  MEWS LOC 0  MEWS Score 4  MEWS Score Color Red  Assess: if the MEWS score is Yellow or Red  Were vital signs taken at a resting state? Yes  Focused Assessment No change from prior assessment  Early Detection of Sepsis Score *See Row Information* Low  MEWS guidelines implemented *See Row Information* No, other (Comment)  Treat  MEWS Interventions Administered scheduled meds/treatments (Resperatory rate was not accurate due to pt movement)  Pain Scale 0-10  Pain Score 0  Complains of Agitation  Neuro symptoms relieved by Rest  Patients response to intervention Effective  2nd Pain Site  Pain Score 0  Take Vital Signs  Increase Vital Sign Frequency  Red: Q 1hr X 4 then Q 4hr X 4, if remains red, continue Q 4hrs (False resp rate reading)  Escalate  MEWS: Escalate Red: discuss with charge nurse/RN and provider, consider discussing with RRT  Notify: Charge Nurse/RN  Name of Charge Nurse/RN Notified [place] RN  Date Charge Nurse/RN Notified 05/26/20  Time Charge Nurse/RN Notified 2055  Document  Patient Outcome Other (Comment) (False reading, will continue to monitor. )

## 2020-05-26 NOTE — Progress Notes (Signed)
   05/26/20 0802  Assess: MEWS Score  Temp 98.3 F (36.8 C)  BP 97/72  Pulse Rate (!) 110  ECG Heart Rate (!) 118  Resp 18  Level of Consciousness Alert  SpO2 98 %  O2 Device Room Air  Patient Activity (if Appropriate) In bed  Assess: MEWS Score  MEWS Temp 0  MEWS Systolic 1  MEWS Pulse 2  MEWS RR 0  MEWS LOC 0  MEWS Score 3  MEWS Score Color Yellow  Assess: if the MEWS score is Yellow or Red  Were vital signs taken at a resting state? Yes  Focused Assessment No change from prior assessment  Early Detection of Sepsis Score *See Row Information* Low  MEWS guidelines implemented *See Row Information* No, previously yellow, continue vital signs every 4 hours  Treat  MEWS Interventions Administered scheduled meds/treatments  Pain Scale 0-10  Pain Score 0   Pt is alert and oriented to self, time and place. Pt denies chest pain and SOB.  Pt is chronic yellow MEWS r/t elevated HR. MD aware. Scheduled medications given.  Nurse will continue to monitor.

## 2020-05-26 NOTE — Progress Notes (Signed)
   05/25/20 2324  Assess: MEWS Score  Temp 98.8 F (37.1 C)  BP 102/61  Pulse Rate (!) 127  ECG Heart Rate (!) 127  Resp (!) 29  SpO2 98 %  Assess: MEWS Score  MEWS Temp 0  MEWS Systolic 0  MEWS Pulse 2  MEWS RR 2  MEWS LOC 0  MEWS Score 4  MEWS Score Color Red  Assess: if the MEWS score is Yellow or Red  Were vital signs taken at a resting state? Yes  Focused Assessment No change from prior assessment  Early Detection of Sepsis Score *See Row Information* Low  MEWS guidelines implemented *See Row Information* Yes  Treat  MEWS Interventions Administered scheduled meds/treatments  Notify: Provider  Provider Name/Title Dr Rachael Darby  Date Provider Notified 05/25/20  Time Provider Notified 2145  Notification Type Page  Notification Reason Change in status  Response See new orders  Date of Provider Response 05/25/20  Time of Provider Response 2146  Document  Patient Outcome Stabilized after interventions  Progress note created (see row info) Yes

## 2020-05-26 NOTE — Progress Notes (Addendum)
Nutrition Follow-up  DOCUMENTATION CODES:   Non-severe (moderate) malnutrition in context of social or environmental circumstances  INTERVENTION:   -Continue MVI with minerals daily -Continue Ensure Enlive poTID, each supplement provides 350 kcal and 20 grams of protein -Continue Magic cup TID with meals, each supplement provides 290 kcal and 9 grams of protein  NUTRITION DIAGNOSIS:   Moderate Malnutrition related to social / environmental circumstances as evidenced by energy intake < 75% for > or equal to 3 months, moderate fat depletion, moderate muscle depletion, percent weight loss.  Ongoing  GOAL:   Patient will meet greater than or equal to 90% of their needs  Progressing   MONITOR:   PO intake, Supplement acceptance, Labs, Weight trends  REASON FOR ASSESSMENT:   Consult Assessment of nutrition requirement/status  ASSESSMENT:   Pt admitted for multiple septic emboli suspicious for possible endocarditis in the setting of IV drug use resulting in sepsis. PMH significant for polysubstance abuse.  7/13 - s/p I&D of bilateral hand abscesses 7/16 - s/p TEE confirming TV vegetation and trace pulmonic valve vegetation 7/20 - s/p angiovac debridement of tricuspid valve 7/23- intubated 7/28- extubated 7/29- advanced to Heart Healthy diet  Reviewed I/O's: -245 ml x 24 hours and -13.6 L since 05/12/20  UOP: 2.4 L x 24 hours  RD attempted to speak with pt via hospital room phone, however, no answer.   Pt remains on IV antibiotics for endocarditis. Per CVTS notes, pt will evaluated as an outpatient after completion of antibiotics.   Pt with good appetite; noted meal completion 25-100%. Pt is consuming Ensure supplements.   Medications reviewed and include colace, lovenox, miralax, and aldactone.   Labs reviewed: CBGS: 87-165.   Diet Order:   Diet Order            Diet Heart Room service appropriate? Yes; Fluid consistency: Thin  Diet effective now                  EDUCATION NEEDS:   Not appropriate for education at this time  Skin:  Skin Assessment: Skin Integrity Issues: Skin Integrity Issues:: Incisions Stage II: - Incisions: lt arm, rt arm, rt neck, rt groin  Last BM:  05/24/20  Height:   Ht Readings from Last 1 Encounters:  05/13/20 5\' 7"  (1.702 m)    Weight:   Wt Readings from Last 1 Encounters:  05/26/20 50.2 kg   BMI:  Body mass index is 17.34 kg/m.  Estimated Nutritional Needs:   Kcal:  1850-2050  Protein:  105-120 grams  Fluid:  > 1.8 L    07/26/20, RD, LDN, CDCES Registered Dietitian II Certified Diabetes Care and Education Specialist Please refer to [facility] for RD and/or RD on-call/weekend/after hours pager

## 2020-05-26 NOTE — Progress Notes (Signed)
   05/26/20 0530  Assess: MEWS Score  Temp 97.8 F (36.6 C)  BP (!) 91/59  Pulse Rate (!) 114  ECG Heart Rate (!) 115  Resp (!) 26  SpO2 97 %  Assess: MEWS Score  MEWS Temp 0  MEWS Systolic 1  MEWS Pulse 2  MEWS RR 2  MEWS LOC 0  MEWS Score 5  MEWS Score Color Red  Assess: if the MEWS score is Yellow or Red  Were vital signs taken at a resting state? Yes  Focused Assessment No change from prior assessment  Early Detection of Sepsis Score *See Row Information* Low  MEWS guidelines implemented *See Row Information* Yes  Treat  Pain Scale 0-10  Pain Score 0  Complains of Agitation  Neuro symptoms relieved by Rest  2nd Pain Site  Pain Score 0  Notify: Charge Nurse/RN  Name of Charge Nurse/RN Notified Alcario Drought RN  Date Charge Nurse/RN Notified 05/26/20  Time Charge Nurse/RN Notified 0601  Notify: Provider  Provider Name/Title Dr Rachael Darby  Date Provider Notified 05/26/20  Time Provider Notified [phone]  Notification Type Page  Notification Reason Change in status  Response See new orders  Date of Provider Response 05/26/20  Time of Provider Response [phone]

## 2020-05-26 NOTE — Progress Notes (Addendum)
PROGRESS NOTE    Clifford HancockJake Clark  WUJ:[id number] DOB: 05/07/1991 DOA: 04/28/2020 PCP: Patient, [name]   Brief Narrative:   Clifford ShileyJake Stockardis an 29 y.o.malepast medical history of IVDA found on side of the road brought into the ED was found to have multiple septic emboli and cavitary lung lesions cultures growing MRSA bacteremia. 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 2D echo showed an EF of 60% with a PFO and a large vegetation in the tricuspid valve. He underwent AngioJet removal of part of the vegetation,repeat a 2D echo showed an EF of 20%. Extubated on 05/19/2020.  8/5: Hypotensive ON. Hold aldactone. Got LR bolus. He may not tolerate entresto.   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     - s/p debulking with angio vac on 05/10/2020 due to large vegetation.     - Rpt 2D echo was on 05/19/2020 that showed an EF of 30%, with persistent tricuspid vegetation measuring 1.8 x 0.8 cm-to the posterior leaflet with severe tricuspid regurgitation.     - CTS has seen, will f/u outpt for consideration of TV replacement after he has completed drug-rehab     - ID has transitioned him to dapt/doxy     - rpt Bld Cx (8/2) showing bacillus     - 8/5: continue dapto/doxy; hypotensive ON. Hold aldactone. Continue fluids.   Acute respiratory failure with hypoxia     - required intubation; extubated on 05/18/2020     - He is stable on RA  Septic shock requiring pressors:     - Secondary to MRSA bacteremia on admission started on pressors weaned, then off pressors and transfer to progressive unit  on 05/20/2020.     - Continue current dapto/doxy; will need at least 6 weeks of antibiotics.     - follow CPK per pharm protocol  Acute systolic heart failure question septic cardiomyopathy     - Initial 2D echo on admission showed an EF of 60%     - repeat a 2D echo on 05/14/2020 showed a reduced EF of 20%.       - He was started on IV Lasix with good diuresis, once he was close to euvolemia it was stopped.     - coreg, entresto     - follow I&O, daily wts     - 8/5: hypotensive ON. Received LR. Hold aldactone. Follow BP. May not tolerate entresto.  Normocytic anemia:     - Hgb 10.6 today. No signs of bleed. Follow  Hypolalemia:     - resolved, follow K+  Substance abuse disorder:     - Patient admits to opiates and cocaine abuse.     - needs outpt counseling, rehab  Bilateral wrist abscess (MSSA):     - Orthopedic surgery was consulted and he was I&D on 05/03/2020 cultures grew MSSA.     - Suture removal by orthopedic surgery.  Malnutrition of moderate degree     - Ensure TID  DVT prophylaxis: lovenox Code Status: FULL Family Communication: None at bedside   Status is: Inpatient  Remains inpatient appropriate because:Inpatient level of  care appropriate due to severity of illness   Dispo: The patient is from: Home              Anticipated d/c is to: Home              Anticipated d/c date is: > 3 days              Patient currently is not medically stable to d/c.  Consultants:   ID  CTS  Cardiology  Pulmonology/CC  Orthopedics  Antimicrobials:  . Dapto, doxy   ROS:  Denies CP, N, V, ab pain, dyspnea . Remainder ROS is negative for all not previously mentioned.  Subjective: "I understand."  Objective: Vitals:   05/26/20 0802 05/26/20 0900 05/26/20 1000 05/26/20 1128  BP: 97/72 96/67 97/65    Pulse: (!) 110 (!) 108 (!) 117   Resp: 18     Temp: 98.3 F (36.8 C)   98.3 F (36.8 C)  TempSrc:    Oral  SpO2: 98%     Weight:      Height:         Intake/Output Summary (Last 24 hours) at 05/26/2020 1506 Last data filed at 05/26/2020 1317 Gross per 24 hour  Intake 1890 ml  Output 2826 ml  Net -936 ml   Filed Weights   05/24/20 0439 05/25/20 0359 05/26/20 0540  Weight: 52.5 kg 53.2 kg 50.2 kg    Examination:  General: 29 y.o. male resting in bed in NAD Cardiovascular: tachy, +S1, S2, no m/g/r Respiratory: CTABL, no w/r/r, normal WOB GI: BS+, NDNT, no masses noted, no organomegaly noted MSK: No e/c/c Neuro: A&O x 3, no focal deficits Psyc: Appropriate interaction and affect, calm/cooperative   Data Reviewed: I have personally reviewed following labs and imaging studies.  CBC: Recent Labs  Lab 05/22/20 0729 05/23/20 0509 05/24/20 0726 05/25/20 0555 05/26/20 0824  WBC 17.9* 16.8* 16.0* 13.8* 12.7*  NEUTROABS  --   --   --   --  8.6*  HGB 10.9* 11.0* 11.4* 10.8* 10.6*  HCT 34.0* 34.7* 35.9* 34.1* 33.5*  MCV 86.3 88.5 87.6 87.0 87.0  PLT 276 284 282 306 332   Basic Metabolic Panel: Recent Labs  Lab 05/20/20 0835 05/21/20 0733 05/22/20 0729 05/23/20 0509 05/24/20 0726 05/25/20 0555 05/26/20 0824  NA 139   < > 137 137 135 134* 137  K 4.1   < > 4.3 4.4 4.4 4.6 4.5  CL 106   < > 104 102 102 99 98  CO2 24   < > 25 24 25 25 28   GLUCOSE 136*   < > 102* 93 102* 95 103*  BUN 8   < > 11 15 20  22* 22*  CREATININE 0.63   < > 0.67 0.78 0.73 0.80 0.77  CALCIUM 8.2*   < > 9.0 9.3 9.4 9.5 9.6  MG 2.0  --   --   --   --   --  2.2  PHOS  --   --   --   --   --   --  4.3   < > = values in this interval not displayed.   GFR: Estimated Creatinine Clearance: 95.9 mL/min (by C-G formula based on SCr of 0.77 mg/dL). Liver Function Tests: Recent Labs  Lab 05/21/20 0733 05/21/20 0733 05/22/20 0729 05/23/20 0509 05/24/20 0726 05/25/20 0555 05/26/20 0824  AST 26  --  31 37 41 84*  --   ALT 30  --  39  49* 62* 114*  --   ALKPHOS 98  --  104 105 107 95  --   BILITOT 0.6  --  0.5 0.6 0.7 0.4  --   PROT 7.1  --  7.8 8.1  8.0 7.6  --   ALBUMIN 2.1*   < > 2.4* 2.7* 2.7* 2.7* 2.8*   < > = values in this interval not displayed.   No results for input(s): LIPASE, AMYLASE in the last 168 hours. No results for input(s): AMMONIA in the last 168 hours. Coagulation Profile: No results for input(s): INR, PROTIME in the last 168 hours. Cardiac Enzymes: Recent Labs  Lab 05/25/20 0555  CKTOTAL 8*   BNP (last 3 results) No results for input(s): PROBNP in the last 8760 hours. HbA1C: No results for input(s): HGBA1C in the last 72 hours. CBG: Recent Labs  Lab 05/25/20 1952 05/26/20 0026 05/26/20 0530 05/26/20 0723 05/26/20 1128  GLUCAP 165* 109* 90 87 88   Lipid Profile: No results for input(s): CHOL, HDL, LDLCALC, TRIG, CHOLHDL, LDLDIRECT in the last 72 hours. Thyroid Function Tests: No results for input(s): TSH, T4TOTAL, FREET4, T3FREE, THYROIDAB in the last 72 hours. Anemia Panel: No results for input(s): VITAMINB12, FOLATE, FERRITIN, TIBC, IRON, RETICCTPCT in the last 72 hours. Sepsis Labs: No results for input(s): PROCALCITON, LATICACIDVEN in the last 168 hours.  Recent Results (from the past 240 hour(s))  Culture, blood (routine x 2)     Status: None   Collection Time: 05/17/20  9:42 AM   Specimen: BLOOD LEFT HAND  Result Value Ref Range Status   Specimen Description BLOOD LEFT HAND  Final   Special Requests   Final    BOTTLES DRAWN AEROBIC ONLY Blood Culture adequate volume   Culture   Final    NO GROWTH 5 DAYS Performed at [facility], [address], [place], [place] [address]    Report Status 05/22/2020 FINAL  Final  Culture, blood (routine x 2)     Status: None   Collection Time: 05/17/20  9:55 AM   Specimen: BLOOD LEFT HAND  Result Value Ref Range Status   Specimen Description BLOOD LEFT HAND  Final   Special Requests   Final    BOTTLES DRAWN AEROBIC ONLY Blood Culture adequate volume   Culture   Final    NO GROWTH 5 DAYS Performed at [facility], [address] [place], [place] [address]    Report Status 05/22/2020 FINAL  Final  Culture, blood (routine x 2)     Status: None   Collection Time: 05/19/20 11:37 AM   Specimen: BLOOD  Result Value Ref Range Status   Specimen Description BLOOD LEFT ANTECUBITAL  Final   Special Requests   Final    BOTTLES DRAWN AEROBIC AND ANAEROBIC Blood Culture adequate volume   Culture   Final    NO GROWTH 5 DAYS Performed at [facility], [address] [place], [place] [address]    Report Status 05/24/2020 FINAL  Final  Culture, blood (routine x 2)     Status: Abnormal   Collection Time: 05/19/20 11:37 AM   Specimen: BLOOD  Result Value Ref Range Status   Specimen Description BLOOD BLOOD LEFT HAND  Final   Special Requests   Final    BOTTLES DRAWN AEROBIC AND ANAEROBIC Blood Culture adequate volume   Culture  Setup Time   Final    GRAM POSITIVE COCCI IN CLUSTERS IN BOTH AEROBIC AND ANAEROBIC  BOTTLES CRITICAL VALUE NOTED.  VALUE IS CONSISTENT WITH PREVIOUSLY REPORTED AND CALLED VALUE. Performed at [facility], [address] [place], [place] [address]    Culture METHICILLIN RESISTANT STAPHYLOCOCCUS AUREUS (A)  Final   Report Status 05/24/2020 FINAL  Final   Organism ID, Bacteria METHICILLIN RESISTANT STAPHYLOCOCCUS AUREUS  Final      Susceptibility   Methicillin resistant staphylococcus aureus - MIC*    CIPROFLOXACIN <=0.5 SENSITIVE Sensitive     ERYTHROMYCIN >=8 RESISTANT Resistant     GENTAMICIN <=0.5 SENSITIVE Sensitive     OXACILLIN >=4 RESISTANT Resistant     TETRACYCLINE <=1 SENSITIVE Sensitive     VANCOMYCIN 2 SENSITIVE Sensitive     TRIMETH/SULFA <=10 SENSITIVE Sensitive     CLINDAMYCIN <=0.25 SENSITIVE Sensitive     RIFAMPIN <=0.5 SENSITIVE Sensitive     Inducible Clindamycin NEGATIVE Sensitive     * METHICILLIN RESISTANT STAPHYLOCOCCUS AUREUS  Culture, blood (routine x 2)     Status: None (Preliminary result)   Collection Time: 05/23/20  9:16 AM   Specimen: BLOOD LEFT HAND   Result Value Ref Range Status   Specimen Description BLOOD LEFT HAND  Final   Special Requests   Final    BOTTLES DRAWN AEROBIC ONLY Blood Culture adequate volume   Culture   Final    NO GROWTH 3 DAYS Performed at [facility], [address], [place], [place] [address]    Report Status PENDING  Incomplete  Culture, blood (routine x 2)     Status: Abnormal   Collection Time: 05/23/20  9:16 AM   Specimen: BLOOD LEFT HAND  Result Value Ref Range Status   Specimen Description BLOOD LEFT HAND  Final   Special Requests   Final    BOTTLES DRAWN AEROBIC ONLY Blood Culture adequate volume   Culture  Setup Time   Final    GRAM POSITIVE RODS AEROBIC BOTTLE ONLY CRITICAL RESULT CALLED TO, READ BACK BY AND VERIFIED WITH: PHARMD A WOLFE CALLED 8 3 21  AT 1447 SK    Culture (A)  Final    BACILLUS SPECIES Standardized susceptibility testing for this organism is not available. Performed at [facility], [address] [place], [place]    Report Status 05/25/2020 FINAL  Final      Radiology Studies: No results found.   Scheduled Meds: . carvedilol  3.125 mg Oral BID WC  . Chlorhexidine Gluconate Cloth  6 each Topical Daily  . docusate sodium  100 mg Oral BID  . doxycycline  100 mg Oral Q12H  . enoxaparin (LOVENOX) injection  40 mg Subcutaneous Q24H  . feeding supplement (ENSURE ENLIVE)  237 mL Oral TID BM  . mouth rinse  15 mL Mouth Rinse BID  . multivitamin with minerals  1 tablet Oral Daily  . pantoprazole  40 mg Oral Daily  . polyethylene glycol  17 g Oral Daily  . potassium chloride  40 mEq Oral Q12H  . sacubitril-valsartan  1 tablet Oral BID  . sodium chloride flush  3 mL Intravenous Q12H  . spironolactone  25 mg Oral Daily   Continuous Infusions: . sodium chloride Stopped (05/18/20 1552)  . DAPTOmycin (CUBICIN)  IV 500 mg (05/25/20 2154)     LOS: 28 days    Time spent: 35 minutes spent in the coordination of care today.    2155,  DO [facility]  If 7PM-7AM, please contact night-coverage [email or web address] 05/26/2020, 3:06 PM

## 2020-05-27 LAB — CBC WITH DIFFERENTIAL/PLATELET
Abs Immature Granulocytes: 0.2 10*3/uL — ABNORMAL HIGH (ref 0.00–0.07)
Basophils Absolute: 0.3 10*3/uL — ABNORMAL HIGH (ref 0.0–0.1)
Basophils Relative: 2 %
Eosinophils Absolute: 0.1 10*3/uL (ref 0.0–0.5)
Eosinophils Relative: 1 %
HCT: 33.2 % — ABNORMAL LOW (ref 39.0–52.0)
Hemoglobin: 10.3 g/dL — ABNORMAL LOW (ref 13.0–17.0)
Immature Granulocytes: 2 %
Lymphocytes Relative: 24 %
Lymphs Abs: 2.9 10*3/uL (ref 0.7–4.0)
MCH: 27.2 pg (ref 26.0–34.0)
MCHC: 31 g/dL (ref 30.0–36.0)
MCV: 87.6 fL (ref 80.0–100.0)
Monocytes Absolute: 1.1 10*3/uL — ABNORMAL HIGH (ref 0.1–1.0)
Monocytes Relative: 9 %
Neutro Abs: 7.7 10*3/uL (ref 1.7–7.7)
Neutrophils Relative %: 62 %
Platelets: 319 10*3/uL (ref 150–400)
RBC: 3.79 MIL/uL — ABNORMAL LOW (ref 4.22–5.81)
RDW: 15.4 % (ref 11.5–15.5)
WBC: 12.3 10*3/uL — ABNORMAL HIGH (ref 4.0–10.5)
nRBC: 0 % (ref 0.0–0.2)

## 2020-05-27 LAB — RENAL FUNCTION PANEL
Albumin: 2.7 g/dL — ABNORMAL LOW (ref 3.5–5.0)
Anion gap: 10 (ref 5–15)
BUN: 26 mg/dL — ABNORMAL HIGH (ref 6–20)
CO2: 25 mmol/L (ref 22–32)
Calcium: 9.6 mg/dL (ref 8.9–10.3)
Chloride: 102 mmol/L (ref 98–111)
Creatinine, Ser: 0.76 mg/dL (ref 0.61–1.24)
GFR calc Af Amer: >60 mL/min (ref >60)
GFR calc non Af Amer: >60 mL/min (ref >60)
Glucose, Bld: 108 mg/dL — ABNORMAL HIGH (ref 70–99)
Phosphorus: 3.4 mg/dL (ref 2.5–4.6)
Potassium: 4.8 mmol/L (ref 3.5–5.1)
Sodium: 137 mmol/L (ref 135–145)

## 2020-05-27 LAB — GLUCOSE, CAPILLARY
Glucose-Capillary: 100 mg/dL — ABNORMAL HIGH (ref 70–99)
Glucose-Capillary: 103 mg/dL — ABNORMAL HIGH (ref 70–99)
Glucose-Capillary: 122 mg/dL — ABNORMAL HIGH (ref 70–99)
Glucose-Capillary: 95 mg/dL (ref 70–99)

## 2020-05-27 LAB — MAGNESIUM: Magnesium: 2 mg/dL (ref 1.7–2.4)

## 2020-05-27 MED ORDER — METOPROLOL TARTRATE 12.5 MG HALF TABLET
12.5000 mg | ORAL_TABLET | Freq: Two times a day (BID) | ORAL | Status: DC
  Administered 2020-05-28 – 2020-06-03 (×13): 12.5 mg via ORAL
  Filled 2020-05-27 (×14): qty 1

## 2020-05-27 NOTE — Progress Notes (Signed)
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 s/p angiovac by CVTS 7/20. Intubated 7/23-7/28. PMHx: IVDU, asthma   OT comments  Pt progressing with OT goals. Session focused on establishing routine for HEP to improve hand strength and [facility] with guidance in various activities to address B hand deficits. Assessed cognition with trail making task during tabletop word game with pt able to demonstrate ability to follow multi-step commands without error. Encouraged pt to ambulate to/from bathroom with staff assistance at least once daily to improve endurance and strength. Educated on formation of daily routine and establishing new habits to improve self efficacy and satisfaction with daily tasks.    Follow Up Recommendations  Home health OT;Supervision/Assistance - 24 hour    Equipment Recommendations  3 in 1 bedside commode    Recommendations for Other Services      Precautions / Restrictions Precautions Precautions: Fall Precaution Comments: monitor HR, BP Restrictions Weight Bearing Restrictions: No       Mobility Bed Mobility Overal bed mobility: Needs Assistance Bed Mobility: Supine to Sit;Sit to Supine     Supine to sit: Supervision Sit to supine: Supervision   General bed mobility comments: Supervision to sit EOB for activities  Transfers Overall transfer level: Needs assistance Equipment used: None Transfers: Sit to/from Stand Sit to Stand: Min guard         General transfer comment: Performed x 2; min guard for safety    Balance Overall balance assessment: Needs assistance Sitting-balance support: No upper extremity supported;Feet supported Sitting balance-Leahy Scale: Good     Standing balance support: No upper  extremity supported Standing balance-Leahy Scale: Good Standing balance comment: Static stand and lower level balance task steadily; had LOB with higher level balance (see below)               High Level Balance Comments: Feet apart EO and EC without difficulty; Feet together EO and EC - no LOB, limited sway; tandem- EO steady, unable to do EC; turn 360 steady but decreased speed; standing marching without UE support x15 both legs and had 2 LOB requiring min A           ADL either performed or assessed with clinical judgement   ADL Overall ADL's : Needs assistance/impaired                                       General ADL Comments: Encouraged pt to request staff assistance for mobility to/from bathroom at least once a day. Session focused mainly on [facility], hand strength, problem solving, and discussion of reforming habits and creating daily routine     Vision   Vision Assessment?: No apparent visual deficits   Perception     Praxis      Cognition Arousal/Alertness: Awake/alert Behavior During Therapy: WFL for tasks assessed/performed Overall Cognitive Status: Impaired/Different from baseline Area of Impairment: Safety/judgement;Problem solving                         Safety/Judgement: Decreased awareness of safety;Decreased awareness of deficits   Problem Solving: Requires verbal cues General Comments: Difficulty with higher level problem solving, able to correct  with verbal cues        Exercises Exercises: Other exercises Hand Exercises Digit Composite Flexion: AROM;10 reps;Squeeze ball Other Exercises Other Exercises: Shuffling cards   Shoulder Instructions       General Comments HR up to 120s during light activities. Session focused on establishing HEP routine (pt lost putty). Guided pt in hand strengthening and fine motor tasks. Pt with difficulty shuffling cards, using nail clippers (not enough strength to pinch). Pt demo ability to  write with marker on notepad for tabletop activity (difficulty with task but legible). Pt able to follow multi step commands for tabletop word game without difficulty.     Pertinent Vitals/ Pain       Pain Assessment: No/denies pain  Home Living                                          Prior Functioning/Environment              Frequency  Min 2X/week        Progress Toward Goals  OT Goals(current goals can now be found in the care plan section)  Progress towards OT goals: Progressing toward goals  Acute Rehab OT Goals Patient Stated Goal: return to independence/drug rehab OT Goal Formulation: With patient Time For Goal Achievement: 06/02/20 Potential to Achieve Goals: Good ADL Goals Pt Will Perform Upper Body Bathing: with modified independence;sitting Pt Will Perform Lower Body Bathing: with modified independence;sit to/from stand Pt Will Perform Upper Body Dressing: with modified independence;sitting Pt Will Perform Lower Body Dressing: with modified independence;sit to/from stand Pt Will Transfer to Toilet: with modified independence;ambulating Pt Will Perform Toileting - Clothing Manipulation and hygiene: with modified independence;sit to/from stand Pt/caregiver will Perform Home Exercise Program: Increased strength;Both right and left upper extremity;With theraputty;With theraband;With written HEP provided;Independently Additional ADL Goal #1: Pt will demonstrate anticipatory awareness during ADL activity in minimally disctracting environment  Plan Discharge plan remains appropriate    Co-evaluation                 AM-PAC OT "6 Clicks" Daily Activity     Outcome Measure   Help from another person eating meals?: None Help from another person taking care of personal grooming?: A Little Help from another person toileting, which includes using toliet, bedpan, or urinal?: A Little Help from another person bathing (including washing, rinsing,  drying)?: A Lot Help from another person to put on and taking off regular upper body clothing?: A Little Help from another person to put on and taking off regular lower body clothing?: A Lot 6 Click Score: 17    End of Session    OT Visit Diagnosis: Unsteadiness on feet (R26.81);Muscle weakness (generalized) (M62.81);History of falling (Z91.81);Other symptoms and signs involving cognitive function;Pain   Activity Tolerance Patient tolerated treatment well   Patient Left in bed;with call bell/phone within reach   Nurse Communication Mobility status        Time: 8841-6606 OT Time Calculation (min): 37 min  Charges: OT General Charges $OT Visit: 1 Visit OT Treatments $Therapeutic Activity: 8-22 mins $Therapeutic Exercise: 8-22 mins  Lorre Munroe, OTR/L   Lorre Munroe 05/27/2020, 2:48 PM

## 2020-05-27 NOTE — Progress Notes (Signed)
   05/27/20 0804  Assess: MEWS Score  Temp 98.5 F (36.9 C)  BP 96/69  Pulse Rate (!) 115  ECG Heart Rate (!) 115  Resp 18  Level of Consciousness Alert  SpO2 98 %  O2 Device Room Air  Assess: MEWS Score  MEWS Temp 0  MEWS Systolic 1  MEWS Pulse 2  MEWS RR 0  MEWS LOC 0  MEWS Score 3  MEWS Score Color Yellow  Assess: if the MEWS score is Yellow or Red  Were vital signs taken at a resting state? Yes  Focused Assessment No change from prior assessment  Early Detection of Sepsis Score *See Row Information* Low  MEWS guidelines implemented *See Row Information* No, previously red, continue vital signs every 4 hours  Treat  MEWS Interventions Administered scheduled meds/treatments  Pain Scale 0-10  Pain Score 0   Patient is alert and oriented x 4. Patient's a chronic yellow mews due to elevated RR and elevated HR from substance withdrawal. Patient's vital sign is taken at a resting state and administered scheduled medications. Will continue q 4 VS assessments.

## 2020-05-27 NOTE — Progress Notes (Signed)
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 s/p angiovac by CVTS 7/20. Intubated 7/23-7/28. PMHx: IVDU, asthma    PT Comments    Pt demonstrating good progress. He was able to progress to ambulation without RW.  He did have instability with stairs and higher level balance activities.  HR 105-133 bpm.  Will continue to benefit from PT to advance.   Follow Up Recommendations  Home health PT;Supervision/Assistance - 24 hour     Equipment Recommendations  None recommended by PT    Recommendations for Other Services       Precautions / Restrictions Precautions Precautions: Fall    Mobility  Bed Mobility Overal bed mobility: Needs Assistance Bed Mobility: Supine to Sit;Sit to Supine     Supine to sit: Supervision Sit to supine: Supervision      Transfers Overall transfer level: Needs assistance Equipment used: None Transfers: Sit to/from Stand Sit to Stand: Min guard         General transfer comment: Performed x 2; min guard for safety  Ambulation/Gait Ambulation/Gait assistance: Min guard Gait Distance (Feet): 300 Feet (300' then 200') Assistive device: None Gait Pattern/deviations: Step-through pattern Gait velocity: decreased   General Gait Details: Able to ambulate without RW but with cautious gait speed.  Did not have LOB but again with cautious speed and hand low guard initially.   Stairs Stairs: Yes Stairs assistance: Min guard Stair Management: One rail Right;Step to pattern Number of Stairs: 10 General stair comments: Attempted alternating pattern but unable and had to to step to pattern; reports more difficult than anticipated   Wheelchair Mobility    Modified Rankin (Stroke Patients Only)        Balance Overall balance assessment: Needs assistance Sitting-balance support: No upper extremity supported;Feet supported Sitting balance-Leahy Scale: Good     Standing balance support: No upper extremity supported Standing balance-Leahy Scale: Good Standing balance comment: Static stand and lower level balance task steadily; had LOB with higher level balance (see below)               High Level Balance Comments: Feet apart EO and EC without difficulty; Feet together EO and EC - no LOB, limited sway; tandem- EO steady, unable to do EC; turn 360 steady but decreased speed; standing marching without UE support x15 both legs and had 2 LOB requiring min A            Cognition Arousal/Alertness: Awake/alert Behavior During Therapy: WFL for tasks assessed/performed Overall Cognitive Status: Within Functional Limits for tasks assessed                                        Exercises      General Comments General comments (skin integrity, edema, etc.): HR 108-112 bpm rest and 125-133 bpm activity      Pertinent Vitals/Pain Pain Assessment: No/denies pain    Home Living                      Prior Function            PT Goals (current goals can now be found in the care plan section)  Acute Rehab PT Goals Patient Stated Goal: return to independence/drug rehab PT Goal Formulation: With patient Time For Goal Achievement: 06/02/20 Potential to Achieve Goals: Good Progress towards PT goals: Progressing toward goals    Frequency    Min 3X/week      PT Plan Current plan remains appropriate    Co-evaluation              AM-PAC PT "6 Clicks" Mobility   Outcome Measure  Help needed turning from your back to your side while in a flat bed without using bedrails?: None Help needed moving from lying on your back to sitting on the side of a flat bed without using bedrails?: None Help needed moving to and from a bed to a chair (including a  wheelchair)?: None Help needed standing up from a chair using your arms (e.g., wheelchair or bedside chair)?: None Help needed to walk in hospital room?: None Help needed climbing 3-5 steps with a railing? : A Little 6 Click Score: 23    End of Session Equipment Utilized During Treatment: Gait belt Activity Tolerance: Patient tolerated treatment well Patient left: with call bell/phone within reach;in bed;with bed alarm set Nurse Communication: Mobility status PT Visit Diagnosis: Other abnormalities of gait and mobility (R26.89);Difficulty in walking, not elsewhere classified (R26.2)     Time: 2694-8546 PT Time Calculation (min) (ACUTE ONLY): 29 min  Charges:  $Gait Training: 8-22 mins $Neuromuscular Re-education: 8-22 mins                     Anise Salvo, PT Acute Rehab Services Pager [phone] [facility] [phone]     Rayetta Humphrey 05/27/2020, 1:31 PM

## 2020-05-27 NOTE — Progress Notes (Signed)
CCMD called to relay that the patient has been sustaining HR in the 120s for 2-3 mins. Patient is watching tv in bed. Gave prn IV metoprolol. Will inform the oncoming RN to monitor for any changes in HR.

## 2020-05-27 NOTE — Progress Notes (Signed)
Patient's BP taken four times alternating arms. All reading remain SBP in the low to mid 90s. Patient's latest automatic BP is 96/69 with a manual of 96/60 with a HR of 115. Carvedilol is scheduled. Paged Ronaldo Miyamoto, MD to notify. Verbal order to hold carvedilol.

## 2020-05-27 NOTE — Progress Notes (Signed)
[facility]  Date of Admission:  04/28/2020     Total days of antibiotics 29         ASSESSMENT:  Clifford Clark blood cultures from 8/2 remain with Baciles species in 1/4 bottles likely representing a contaminant. Will start 6 week timeframe from 8/2 with completion of antibiotics for 6 weeks on 9/13, Consider 2 weeks of IV antibiotics with possible transition to Zyvox for 4 weeks depending on willingness to stay.  CK levels at 8. Continue Daptomycin and doxycycline. Notify ID in 2 weeks around 8/16) to make determination for treatment and possible discharge with oral regimen. Ok for PICC line if needed.   PLAN:  1. Continue daptomycin and doxycyline.  2. Monitor CK levels for therapeutic drug monitoring.  3. Please re-consult ID in 2 weeks for possible change to oral medication vs. continuation of IV therapy.   ID will sign off for now.   Principal Problem:   Endocarditis Active Problems:   Intravenous drug abuse (HCC)   Septic embolism (HCC)   Sepsis (HCC)   Thrombocytopenia (HCC)   Hypokalemia   Hyponatremia   MSSA bacteremia   Malnutrition of moderate degree   Threatening behavior   Hand abscess   Elevated BUN   Pressure injury of skin   Chest pain   Acute respiratory failure (HCC)   Acute systolic heart failure (HCC)   . carvedilol  3.125 mg Oral BID WC  . Chlorhexidine Gluconate Cloth  6 each Topical Daily  . docusate sodium  100 mg Oral BID  . doxycycline  100 mg Oral Q12H  . enoxaparin (LOVENOX) injection  40 mg Subcutaneous Q24H  . feeding supplement (ENSURE ENLIVE)  237 mL Oral TID BM  . mouth rinse  15 mL Mouth Rinse BID  . multivitamin with minerals  1 tablet Oral Daily  . pantoprazole  40 mg Oral Daily  . polyethylene glycol  17 g Oral Daily  . potassium chloride  40 mEq Oral Q12H  . sacubitril-valsartan  1 tablet Oral BID  . sodium chloride flush  3 mL Intravenous Q12H    SUBJECTIVE:  Afebrile overnight with no acute  events.   No Known Allergies   Review of Systems: Review of Systems  Constitutional: Negative for chills, fever and weight loss.  Respiratory: Negative for cough, shortness of breath and wheezing.   Cardiovascular: Negative for chest pain and leg swelling.  Gastrointestinal: Negative for abdominal pain, constipation, diarrhea, nausea and vomiting.  Skin: Negative for rash.      OBJECTIVE: Vitals:   05/27/20 0523 05/27/20 0724 05/27/20 0738 05/27/20 0804  BP:  96/69  96/69  Pulse:  (!) 115  (!) 115  Resp:  18  18  Temp:  98.5 F (36.9 C) 97.7 F (36.5 C) 98.5 F (36.9 C)  TempSrc:   Oral   SpO2:    98%  Weight: 51.1 kg     Height:       Body mass index is 17.64 kg/m.  Physical Exam Constitutional:      General: He is not in acute distress.    Appearance: He is well-developed.  Cardiovascular:     Rate and Rhythm: Normal rate and regular rhythm.     Heart sounds: Normal heart sounds.  Pulmonary:     Effort: Pulmonary effort is normal.     Breath sounds: Normal breath sounds.  Skin:    General: Skin is warm and dry.  Neurological:  Mental Status: He is alert and oriented to person, place, and time.  Psychiatric:        Behavior: Behavior normal.        Thought Content: Thought content normal.        Judgment: Judgment normal.     Lab Results Lab Results  Component Value Date   WBC 12.3 (H) 05/27/2020   HGB 10.3 (L) 05/27/2020   HCT 33.2 (L) 05/27/2020   MCV 87.6 05/27/2020   PLT 319 05/27/2020    Lab Results  Component Value Date   CREATININE 0.76 05/27/2020   BUN 26 (H) 05/27/2020   NA 137 05/27/2020   K 4.8 05/27/2020   CL 102 05/27/2020   CO2 25 05/27/2020    Lab Results  Component Value Date   ALT 114 (H) 05/25/2020   AST 84 (H) 05/25/2020   ALKPHOS 95 05/25/2020   BILITOT 0.4 05/25/2020     Microbiology: Recent Results (from the past 240 hour(s))  Culture, blood (routine x 2)     Status: None   Collection Time: 05/17/20  9:42 AM    Specimen: BLOOD LEFT HAND  Result Value Ref Range Status   Specimen Description BLOOD LEFT HAND  Final   Special Requests   Final    BOTTLES DRAWN AEROBIC ONLY Blood Culture adequate volume   Culture   Final    NO GROWTH 5 DAYS Performed at [facility], [address], [place], [place] [address]    Report Status 05/22/2020 FINAL  Final  Culture, blood (routine x 2)     Status: None   Collection Time: 05/17/20  9:55 AM   Specimen: BLOOD LEFT HAND  Result Value Ref Range Status   Specimen Description BLOOD LEFT HAND  Final   Special Requests   Final    BOTTLES DRAWN AEROBIC ONLY Blood Culture adequate volume   Culture   Final    NO GROWTH 5 DAYS Performed at [facility], [address] [place], [place] [address]    Report Status 05/22/2020 FINAL  Final  Culture, blood (routine x 2)     Status: None   Collection Time: 05/19/20 11:37 AM   Specimen: BLOOD  Result Value Ref Range Status   Specimen Description BLOOD LEFT ANTECUBITAL  Final   Special Requests   Final    BOTTLES DRAWN AEROBIC AND ANAEROBIC Blood Culture adequate volume   Culture   Final    NO GROWTH 5 DAYS Performed at [facility], [address] [place], [place] [address]    Report Status 05/24/2020 FINAL  Final  Culture, blood (routine x 2)     Status: Abnormal   Collection Time: 05/19/20 11:37 AM   Specimen: BLOOD  Result Value Ref Range Status   Specimen Description BLOOD BLOOD LEFT HAND  Final   Special Requests   Final    BOTTLES DRAWN AEROBIC AND ANAEROBIC Blood Culture adequate volume   Culture  Setup Time   Final    GRAM POSITIVE COCCI IN CLUSTERS IN BOTH AEROBIC AND ANAEROBIC BOTTLES CRITICAL VALUE NOTED.  VALUE IS CONSISTENT WITH PREVIOUSLY REPORTED AND CALLED VALUE. Performed at [facility], [address], [place], [place] [address]    Culture METHICILLIN RESISTANT STAPHYLOCOCCUS AUREUS (A)  Final   Report Status 05/24/2020 FINAL  Final   Organism ID, Bacteria  METHICILLIN RESISTANT STAPHYLOCOCCUS AUREUS  Final      Susceptibility   Methicillin resistant staphylococcus aureus -  MIC*    CIPROFLOXACIN <=0.5 SENSITIVE Sensitive     ERYTHROMYCIN >=8 RESISTANT Resistant     GENTAMICIN <=0.5 SENSITIVE Sensitive     OXACILLIN >=4 RESISTANT Resistant     TETRACYCLINE <=1 SENSITIVE Sensitive     VANCOMYCIN 2 SENSITIVE Sensitive     TRIMETH/SULFA <=10 SENSITIVE Sensitive     CLINDAMYCIN <=0.25 SENSITIVE Sensitive     RIFAMPIN <=0.5 SENSITIVE Sensitive     Inducible Clindamycin NEGATIVE Sensitive     * METHICILLIN RESISTANT STAPHYLOCOCCUS AUREUS  Culture, blood (routine x 2)     Status: None (Preliminary result)   Collection Time: 05/23/20  9:16 AM   Specimen: BLOOD LEFT HAND  Result Value Ref Range Status   Specimen Description BLOOD LEFT HAND  Final   Special Requests   Final    BOTTLES DRAWN AEROBIC ONLY Blood Culture adequate volume   Culture   Final    NO GROWTH 3 DAYS Performed at [facility], [address], [place], [place] [address]    Report Status PENDING  Incomplete  Culture, blood (routine x 2)     Status: Abnormal   Collection Time: 05/23/20  9:16 AM   Specimen: BLOOD LEFT HAND  Result Value Ref Range Status   Specimen Description BLOOD LEFT HAND  Final   Special Requests   Final    BOTTLES DRAWN AEROBIC ONLY Blood Culture adequate volume   Culture  Setup Time   Final    GRAM POSITIVE RODS AEROBIC BOTTLE ONLY CRITICAL RESULT CALLED TO, READ BACK BY AND VERIFIED WITH: PHARMD A WOLFE CALLED 8 3 21  AT 1447 SK    Culture (A)  Final    BACILLUS SPECIES Standardized susceptibility testing for this organism is not available. Performed at [facility], [address] [place], [place]    Report Status 05/25/2020 FINAL  Final     07/25/2020, NP [facility]  05/27/2020  9:08 AM

## 2020-05-27 NOTE — Progress Notes (Signed)
   05/27/20 1624  Vitals  Temp 97.9 F (36.6 C)  Temp Source Oral  BP 98/69  BP Location Left Arm  BP Method Automatic  Patient Position (if appropriate) Lying  Pulse Rate (!) 110  ECG Heart Rate (!) 112  Resp (!) 22  Level of Consciousness  Level of Consciousness Alert  MEWS COLOR  MEWS Score Color Red  Oxygen Therapy  SpO2 100 %  O2 Device Room Air  O2 Flow Rate (L/min) 0 L/min  Patient Activity (if Appropriate) In bed  Pain Assessment  Pain Scale 0-10  Pain Score 0  MEWS Score  MEWS Temp 0  MEWS Systolic 1  MEWS Pulse 2  MEWS RR 1  MEWS LOC 0  MEWS Score 4   Patient is alert and oriented x 4. Patient's VS has been taken in a resting state four times, SBP ranges from low to mid 90s. Paged Ronaldo Miyamoto MD to notify. Verbal order to hold scheduled carvedilol. Will reassess VS in one hour.
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
## 2020-05-28 LAB — CULTURE, BLOOD (ROUTINE X 2)
Culture: NO GROWTH
Special Requests: ADEQUATE

## 2020-05-28 LAB — CBC WITH DIFFERENTIAL/PLATELET
Abs Immature Granulocytes: 0.24 10*3/uL — ABNORMAL HIGH (ref 0.00–0.07)
Basophils Absolute: 0.3 10*3/uL — ABNORMAL HIGH (ref 0.0–0.1)
Basophils Relative: 3 %
Eosinophils Absolute: 0.1 10*3/uL (ref 0.0–0.5)
Eosinophils Relative: 1 %
HCT: 35 % — ABNORMAL LOW (ref 39.0–52.0)
Hemoglobin: 11.1 g/dL — ABNORMAL LOW (ref 13.0–17.0)
Immature Granulocytes: 2 %
Lymphocytes Relative: 27 %
Lymphs Abs: 3.1 10*3/uL (ref 0.7–4.0)
MCH: 27.6 pg (ref 26.0–34.0)
MCHC: 31.7 g/dL (ref 30.0–36.0)
MCV: 87.1 fL (ref 80.0–100.0)
Monocytes Absolute: 1.1 10*3/uL — ABNORMAL HIGH (ref 0.1–1.0)
Monocytes Relative: 9 %
Neutro Abs: 6.7 10*3/uL (ref 1.7–7.7)
Neutrophils Relative %: 58 %
Platelets: 356 10*3/uL (ref 150–400)
RBC: 4.02 MIL/uL — ABNORMAL LOW (ref 4.22–5.81)
RDW: 15.5 % (ref 11.5–15.5)
WBC: 11.6 10*3/uL — ABNORMAL HIGH (ref 4.0–10.5)
nRBC: 0 % (ref 0.0–0.2)

## 2020-05-28 LAB — RENAL FUNCTION PANEL
Albumin: 2.9 g/dL — ABNORMAL LOW (ref 3.5–5.0)
Anion gap: 9 (ref 5–15)
BUN: 26 mg/dL — ABNORMAL HIGH (ref 6–20)
CO2: 27 mmol/L (ref 22–32)
Calcium: 9.4 mg/dL (ref 8.9–10.3)
Chloride: 99 mmol/L (ref 98–111)
Creatinine, Ser: 0.74 mg/dL (ref 0.61–1.24)
GFR calc Af Amer: >60 mL/min (ref >60)
GFR calc non Af Amer: >60 mL/min (ref >60)
Glucose, Bld: 99 mg/dL (ref 70–99)
Phosphorus: 4.4 mg/dL (ref 2.5–4.6)
Potassium: 4.5 mmol/L (ref 3.5–5.1)
Sodium: 135 mmol/L (ref 135–145)

## 2020-05-28 LAB — MAGNESIUM: Magnesium: 2.2 mg/dL (ref 1.7–2.4)
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
## 2020-05-28 NOTE — Progress Notes (Signed)
   05/28/20 0913  Vitals  Temp 98.4 F (36.9 C)  Temp Source Oral  BP 93/67  MAP (mmHg) 76  BP Location Right Arm  BP Method Automatic  Patient Position (if appropriate) Lying  Pulse Rate (!) 106  Pulse Rate Source Monitor  ECG Heart Rate (!) 108  Resp 20  Level of Consciousness  Level of Consciousness Alert  MEWS COLOR  MEWS Score Color Yellow  Oxygen Therapy  SpO2 98 %  O2 Device Room Air  Pain Assessment  Pain Scale 0-10  Pain Score 0  PCA/Epidural/Spinal Assessment  Respiratory Pattern Regular;Unlabored  Glasgow Coma Scale  Eye Opening 4  Best Verbal Response (NON-intubated) 5  Best Motor Response 6  Glasgow Coma Scale Score 15  MEWS Score  MEWS Temp 0  MEWS Systolic 1  MEWS Pulse 1  MEWS RR 0  MEWS LOC 0  MEWS Score 2   Patient is alert and oriented and expresses no chest pain and no distress. Patient's a chronic yellow MEWS; MD is aware. Will continue to monitor.

## 2020-05-28 NOTE — Plan of Care (Signed)
  Problem: Health Behavior/Discharge Planning: Goal: Ability to manage health-related needs will improve Outcome: Progressing   Problem: Clinical Measurements: Goal: Ability to maintain clinical measurements within normal limits will improve Outcome: Progressing   Problem: Activity: Goal: Risk for activity intolerance will decrease Outcome: Progressing   Problem: Nutrition: Goal: Adequate nutrition will be maintained Outcome: Progressing   

## 2020-05-29 LAB — RENAL FUNCTION PANEL
Albumin: 2.7 g/dL — ABNORMAL LOW (ref 3.5–5.0)
Anion gap: 13 (ref 5–15)
BUN: 20 mg/dL (ref 6–20)
CO2: 24 mmol/L (ref 22–32)
Calcium: 9.1 mg/dL (ref 8.9–10.3)
Chloride: 101 mmol/L (ref 98–111)
Creatinine, Ser: 0.68 mg/dL (ref 0.61–1.24)
GFR calc Af Amer: >60 mL/min (ref >60)
GFR calc non Af Amer: >60 mL/min (ref >60)
Glucose, Bld: 119 mg/dL — ABNORMAL HIGH (ref 70–99)
Phosphorus: 3.2 mg/dL (ref 2.5–4.6)
Potassium: 4 mmol/L (ref 3.5–5.1)
Sodium: 138 mmol/L (ref 135–145)

## 2020-05-29 LAB — CBC WITH DIFFERENTIAL/PLATELET
Abs Immature Granulocytes: 0.27 10*3/uL — ABNORMAL HIGH (ref 0.00–0.07)
Basophils Absolute: 0.3 10*3/uL — ABNORMAL HIGH (ref 0.0–0.1)
Basophils Relative: 2 %
Eosinophils Absolute: 0.1 10*3/uL (ref 0.0–0.5)
Eosinophils Relative: 1 %
HCT: 33.5 % — ABNORMAL LOW (ref 39.0–52.0)
Hemoglobin: 10.6 g/dL — ABNORMAL LOW (ref 13.0–17.0)
Immature Granulocytes: 2 %
Lymphocytes Relative: 29 %
Lymphs Abs: 3.3 10*3/uL (ref 0.7–4.0)
MCH: 27.7 pg (ref 26.0–34.0)
MCHC: 31.6 g/dL (ref 30.0–36.0)
MCV: 87.5 fL (ref 80.0–100.0)
Monocytes Absolute: 1.2 10*3/uL — ABNORMAL HIGH (ref 0.1–1.0)
Monocytes Relative: 10 %
Neutro Abs: 6.3 10*3/uL (ref 1.7–7.7)
Neutrophils Relative %: 56 %
Platelets: 343 10*3/uL (ref 150–400)
RBC: 3.83 MIL/uL — ABNORMAL LOW (ref 4.22–5.81)
RDW: 15.8 % — ABNORMAL HIGH (ref 11.5–15.5)
WBC: 11.4 10*3/uL — ABNORMAL HIGH (ref 4.0–10.5)
nRBC: 0 % (ref 0.0–0.2)

## 2020-05-29 LAB — MAGNESIUM: Magnesium: 1.8 mg/dL (ref 1.7–2.4)
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
## 2020-05-30 LAB — RENAL FUNCTION PANEL
Albumin: 2.8 g/dL — ABNORMAL LOW (ref 3.5–5.0)
Anion gap: 9 (ref 5–15)
BUN: 21 mg/dL — ABNORMAL HIGH (ref 6–20)
CO2: 26 mmol/L (ref 22–32)
Calcium: 9.6 mg/dL (ref 8.9–10.3)
Chloride: 103 mmol/L (ref 98–111)
Creatinine, Ser: 0.8 mg/dL (ref 0.61–1.24)
GFR calc Af Amer: >60 mL/min (ref >60)
GFR calc non Af Amer: >60 mL/min (ref >60)
Glucose, Bld: 96 mg/dL (ref 70–99)
Phosphorus: 4 mg/dL (ref 2.5–4.6)
Potassium: 4.5 mmol/L (ref 3.5–5.1)
Sodium: 138 mmol/L (ref 135–145)

## 2020-05-30 LAB — CBC WITH DIFFERENTIAL/PLATELET
Abs Immature Granulocytes: 0.45 10*3/uL — ABNORMAL HIGH (ref 0.00–0.07)
Basophils Absolute: 0.3 10*3/uL — ABNORMAL HIGH (ref 0.0–0.1)
Basophils Relative: 3 %
Eosinophils Absolute: 0.2 10*3/uL (ref 0.0–0.5)
Eosinophils Relative: 1 %
HCT: 33.4 % — ABNORMAL LOW (ref 39.0–52.0)
Hemoglobin: 10.5 g/dL — ABNORMAL LOW (ref 13.0–17.0)
Immature Granulocytes: 4 %
Lymphocytes Relative: 30 %
Lymphs Abs: 3.8 10*3/uL (ref 0.7–4.0)
MCH: 28.1 pg (ref 26.0–34.0)
MCHC: 31.4 g/dL (ref 30.0–36.0)
MCV: 89.3 fL (ref 80.0–100.0)
Monocytes Absolute: 1.3 10*3/uL — ABNORMAL HIGH (ref 0.1–1.0)
Monocytes Relative: 11 %
Neutro Abs: 6.6 10*3/uL (ref 1.7–7.7)
Neutrophils Relative %: 51 %
Platelets: 334 10*3/uL (ref 150–400)
RBC: 3.74 MIL/uL — ABNORMAL LOW (ref 4.22–5.81)
RDW: 16 % — ABNORMAL HIGH (ref 11.5–15.5)
WBC: 12.6 10*3/uL — ABNORMAL HIGH (ref 4.0–10.5)
nRBC: 0 % (ref 0.0–0.2)

## 2020-05-30 LAB — MAGNESIUM: Magnesium: 1.9 mg/dL (ref 1.7–2.4)
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
## 2020-05-30 NOTE — Progress Notes (Signed)
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 s/p angiovac by CVTS 7/20. Intubated 7/23-7/28. PMHx: IVDU, asthma   OT comments  Patient in agreement with OT treatment session with focus on BUE strengthening, coordination, and bimanual manipulation in prep for BADLs/IADLs, work and leisure activities. OT follow-up with HEP schedule provided at last tx. session with patient stating that he hasn't seen the schedule since it was made and that he has not been performing any exercises or [facility] activities during the day. With further questioning, patient states he hopes that his function just returns on it's own. OT provided education on completion of activities to regain coordination and functional use of bilateral hands. With assist from OT, patient able to clip nails on R hand with supervision A but required Min A to clip nails on L hand. Patient expressed frustration with CLOF and situation. OT provided emotional support. Patient would benefit from continued acute OT services to maximize safety and independence with self-care tasks in prep for safe d/c to next level of care.    Follow Up Recommendations  Home health OT;Supervision/Assistance - 24 hour    Equipment Recommendations  3 in 1 bedside commode    Recommendations for Other Services      Precautions / Restrictions Precautions Precautions: Fall Precaution Comments: monitor HR, BP Restrictions Weight Bearing Restrictions: No       Mobility Bed Mobility Overal bed mobility: Needs Assistance Bed Mobility: Supine to Sit;Sit to Supine     Supine to sit: Supervision Sit to supine: Supervision   General bed mobility comments: Supervision to sit EOB for activities. HR 130's with little  movement.   Transfers                      Balance Overall balance assessment: Needs assistance Sitting-balance support: No upper extremity supported;Feet supported Sitting balance-Leahy Scale: Good                                     ADL either performed or assessed with clinical judgement   ADL                                               Vision       Perception     Praxis      Cognition Arousal/Alertness: Awake/alert Behavior During Therapy: WFL for tasks assessed/performed Overall Cognitive Status: Impaired/Different from baseline Area of Impairment: Safety/judgement;Problem solving                         Safety/Judgement: Decreased awareness of safety;Decreased awareness of deficits   Problem Solving: Requires verbal cues General Comments: Difficulty with higher level problem solving, able to correct with verbal cues        Exercises Exercises: Other exercises Hand Exercises Digit Composite Flexion: AROM;10 reps Other Exercises Other Exercises: Shuffling cards   Shoulder Instructions       General Comments HR 120's-130's with supine to EOB transfer.     Pertinent Vitals/ Pain  Pain Assessment: No/denies pain  Home Living                                          Prior Functioning/Environment              Frequency  Min 2X/week        Progress Toward Goals  OT Goals(current goals can now be found in the care plan section)  Progress towards OT goals: Progressing toward goals  Acute Rehab OT Goals Patient Stated Goal: return to independence/drug rehab OT Goal Formulation: With patient Time For Goal Achievement: 06/02/20 Potential to Achieve Goals: Good ADL Goals Pt Will Perform Upper Body Bathing: with modified independence;sitting Pt Will Perform Lower Body Bathing: with modified independence;sit to/from stand Pt Will Perform Upper Body Dressing: with  modified independence;sitting Pt Will Perform Lower Body Dressing: with modified independence;sit to/from stand Pt Will Transfer to Toilet: with modified independence;ambulating Pt Will Perform Toileting - Clothing Manipulation and hygiene: with modified independence;sit to/from stand Pt/caregiver will Perform Home Exercise Program: Increased strength;Both right and left upper extremity;With theraputty;With theraband;With written HEP provided;Independently Additional ADL Goal #1: Pt will demonstrate anticipatory awareness during ADL activity in minimally disctracting environment  Plan Discharge plan remains appropriate    Co-evaluation                 AM-PAC OT "6 Clicks" Daily Activity     Outcome Measure   Help from another person eating meals?: None Help from another person taking care of personal grooming?: A Little Help from another person toileting, which includes using toliet, bedpan, or urinal?: A Little Help from another person bathing (including washing, rinsing, drying)?: A Lot Help from another person to put on and taking off regular upper body clothing?: A Little Help from another person to put on and taking off regular lower body clothing?: A Lot 6 Click Score: 17    End of Session    OT Visit Diagnosis: Unsteadiness on feet (R26.81);Muscle weakness (generalized) (M62.81);History of falling (Z91.81);Other symptoms and signs involving cognitive function;Pain   Activity Tolerance Patient tolerated treatment well   Patient Left in bed;with call bell/phone within reach   Nurse Communication          Time: 1107-1130 OT Time Calculation (min): 23 min  Charges: OT General Charges $OT Visit: 1 Visit OT Treatments $Self Care/Home Management : 8-22 mins $Therapeutic Activity: 8-22 mins  Clifford Clark H. OTR/L Supplemental OT, Department of rehab services [phone]   Clifford Candy R H. 05/30/2020, 12:26 PM

## 2020-05-30 NOTE — Progress Notes (Signed)
Pt c/o IV leaking. RN attempted to repair IV site, but IV continued to leak and had to be removed. Pt refusing new IV site until next morning. Will continue to monitor.

## 2020-05-30 NOTE — Progress Notes (Signed)
   05/29/20 1900  Assess: MEWS Score  ECG Heart Rate (!) 120  Assess: MEWS Score  MEWS Temp 0  MEWS Systolic 1  MEWS Pulse 2  MEWS RR 0  MEWS LOC 0  MEWS Score 3  MEWS Score Color Yellow  Assess: if the MEWS score is Yellow or Red  Were vital signs taken at a resting state? Yes  Focused Assessment No change from prior assessment  Early Detection of Sepsis Score *See Row Information* Low  MEWS guidelines implemented *See Row Information* No, previously yellow, continue vital signs every 4 hours  Treat  MEWS Interventions Administered scheduled meds/treatments;Escalated (See documentation below)  Pain Scale 0-10  Pain Score 0  Escalate  MEWS: Escalate Yellow: discuss with charge nurse/RN and consider discussing with provider and RRT  Notify: Charge Nurse/RN  Name of Charge Nurse/RN Notified Jequetta  Date Charge Nurse/RN Notified 05/30/20  Time Charge Nurse/RN Notified 2000  Document  Patient Outcome Stabilized after interventions  Progress note created (see row info) Yes

## 2020-05-30 NOTE — Progress Notes (Signed)
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 s/p angiovac by CVTS 7/20. Intubated 7/23-7/28. PMHx: IVDU, asthma    PT Comments    Pt admitted with above diagnosis. Pt was able to ambulate without device with min guard assist and no LOB with min challenges to balance.  Pt progressing however limited him today due to elevated HR at rest and HR to 143 bpm with ambulation only.  Will continue PT.  Pt currently with functional limitations due to balance and endurance deficits. Pt will benefit from skilled PT to increase their independence and safety with mobility to allow discharge to the venue listed below.     Follow Up Recommendations  Home health PT;Supervision/Assistance - 24 hour     Equipment Recommendations  None recommended by PT    Recommendations for Other Services OT consult     Precautions / Restrictions Precautions Precautions: Fall Precaution Comments: monitor HR, BP Restrictions Weight Bearing Restrictions: No    Mobility  Bed Mobility Overal bed mobility: Needs Assistance Bed Mobility: Supine to Sit;Sit to Supine     Supine to sit: Supervision Sit to supine: Supervision   General bed mobility comments: Supervision to sit EOB for activities. HR 130's with little movement.   Transfers Overall transfer level: Needs assistance Equipment used: None Transfers: Sit to/from Stand Sit to Stand: Min guard         General transfer comment: No difficulty standing from EOB.   Ambulation/Gait Ambulation/Gait assistance: Min guard;Supervision Gait Distance (Feet): 500 Feet Assistive device: None Gait Pattern/deviations: Step-through pattern;Decreased stride length Gait velocity: decreased Gait velocity interpretation:  1.31 - 2.62 ft/sec, indicative of limited community ambulator General Gait Details: Able to ambulate without RW.  Did not have LOB.  can withstand min challenges to balance.  Did not practice steps again as HR 129 bpm initially and up to 143 bpm with activity.    Stairs             Wheelchair Mobility    Modified Rankin (Stroke Patients Only)       Balance Overall balance assessment: Needs assistance Sitting-balance support: No upper extremity supported;Feet supported Sitting balance-Leahy Scale: Good Sitting balance - Comments: pt able to sit EOB without LOB.  Placed socks on in long sitting wihtout assist prior to sitting EOB   Standing balance support: No upper extremity supported Standing balance-Leahy Scale: Good Standing balance comment: Static stand and lower level balance task steadily                            Cognition Arousal/Alertness: Awake/alert Behavior During Therapy: WFL for tasks assessed/performed Overall Cognitive Status: Impaired/Different from baseline Area of Impairment: Safety/judgement;Problem solving                         Safety/Judgement: Decreased awareness of safety;Decreased awareness of deficits   Problem Solving: Requires verbal cues General Comments: Difficulty with higher level problem solving, able to correct with verbal cues      Exercises Hand Exercises Digit Composite Flexion: AROM;10 reps Other Exercises Other Exercises: Shuffling cards    General Comments General comments (skin integrity, edema, etc.): HR elevated today initially at 129 bpm.  HR  to 143 bpm with ambulation only therfore limited rx today some due toHR.       Pertinent Vitals/Pain Pain Assessment: No/denies pain    Home Living                      Prior Function            PT Goals (current goals can now be found in the care plan section) Acute Rehab PT Goals Patient Stated Goal: return to independence/drug  rehab Progress towards PT goals: Progressing toward goals    Frequency    Min 3X/week      PT Plan Current plan remains appropriate    Co-evaluation              AM-PAC PT "6 Clicks" Mobility   Outcome Measure  Help needed turning from your back to your side while in a flat bed without using bedrails?: None Help needed moving from lying on your back to sitting on the side of a flat bed without using bedrails?: None Help needed moving to and from a bed to a chair (including a wheelchair)?: None Help needed standing up from a chair using your arms (e.g., wheelchair or bedside chair)?: None Help needed to walk in hospital room?: None Help needed climbing 3-5 steps with a railing? : A Little 6 Click Score: 23    End of Session Equipment Utilized During Treatment: Gait belt Activity Tolerance: Patient tolerated treatment well Patient left: with call bell/phone within reach;in bed;with bed alarm set Nurse Communication: Mobility status PT Visit Diagnosis: Other abnormalities of gait and mobility (R26.89);Difficulty in walking, not elsewhere classified (R26.2)     Time: 1324-4010 PT Time Calculation (min) (ACUTE ONLY): 14 min  Charges:  $Gait Training: 8-22 mins                     Julya Alioto W,PT Acute Rehabilitation Services Pager:  [phone]  Office:  [phone]     Berline Lopes 05/30/2020, 12:40 PM

## 2020-05-31 LAB — CBC WITH DIFFERENTIAL/PLATELET
Abs Immature Granulocytes: 0.53 10*3/uL — ABNORMAL HIGH (ref 0.00–0.07)
Basophils Absolute: 0.3 10*3/uL — ABNORMAL HIGH (ref 0.0–0.1)
Basophils Relative: 2 %
Eosinophils Absolute: 0.3 10*3/uL (ref 0.0–0.5)
Eosinophils Relative: 2 %
HCT: 33.4 % — ABNORMAL LOW (ref 39.0–52.0)
Hemoglobin: 10.1 g/dL — ABNORMAL LOW (ref 13.0–17.0)
Immature Granulocytes: 5 %
Lymphocytes Relative: 27 %
Lymphs Abs: 3.1 10*3/uL (ref 0.7–4.0)
MCH: 27 pg (ref 26.0–34.0)
MCHC: 30.2 g/dL (ref 30.0–36.0)
MCV: 89.3 fL (ref 80.0–100.0)
Monocytes Absolute: 1 10*3/uL (ref 0.1–1.0)
Monocytes Relative: 9 %
Neutro Abs: 6.4 10*3/uL (ref 1.7–7.7)
Neutrophils Relative %: 55 %
Platelets: 349 10*3/uL (ref 150–400)
RBC: 3.74 MIL/uL — ABNORMAL LOW (ref 4.22–5.81)
RDW: 16.3 % — ABNORMAL HIGH (ref 11.5–15.5)
WBC: 11.6 10*3/uL — ABNORMAL HIGH (ref 4.0–10.5)
nRBC: 0 % (ref 0.0–0.2)

## 2020-05-31 LAB — RENAL FUNCTION PANEL
Albumin: 2.8 g/dL — ABNORMAL LOW (ref 3.5–5.0)
Anion gap: 9 (ref 5–15)
BUN: 25 mg/dL — ABNORMAL HIGH (ref 6–20)
CO2: 25 mmol/L (ref 22–32)
Calcium: 9.3 mg/dL (ref 8.9–10.3)
Chloride: 101 mmol/L (ref 98–111)
Creatinine, Ser: 0.66 mg/dL (ref 0.61–1.24)
GFR calc Af Amer: >60 mL/min (ref >60)
GFR calc non Af Amer: >60 mL/min (ref >60)
Glucose, Bld: 102 mg/dL — ABNORMAL HIGH (ref 70–99)
Phosphorus: 3.4 mg/dL (ref 2.5–4.6)
Potassium: 4.5 mmol/L (ref 3.5–5.1)
Sodium: 135 mmol/L (ref 135–145)

## 2020-05-31 MED ORDER — SODIUM CHLORIDE 0.9% FLUSH
10.0000 mL | Freq: Two times a day (BID) | INTRAVENOUS | Status: DC
  Administered 2020-05-31 – 2020-06-16 (×29): 10 mL

## 2020-05-31 MED ORDER — SODIUM CHLORIDE 0.9% FLUSH: 10.0000 mL | INTRAVENOUS | Status: DC | PRN

## 2020-05-31 NOTE — Progress Notes (Addendum)
PROGRESS NOTE    Clifford Clark  YPP:[id number] DOB: December 28, 1990 DOA: 04/28/2020 PCP: Patient, [name]   Brief Narrative:   Clifford Clark an 29 y.o.malepast medical history of IVDA found on side of the road brought into the ED was found to have multiple septic emboli and cavitary lung lesions cultures growing MRSA bacteremia. 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 2D echo showed an EF of 60% with a PFO and a large vegetation in the tricuspid valve. He underwent AngioJet removal of part of the vegetation,repeat a 2D echo showed an EF of 20%. Extubated on 05/19/2020.  8/10: He is doing ok this AM. Continuing IV abx through 8/18. He denies complaints. Continue current Tx.    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 - s/p debulking with angio vac on 05/10/2020 due to large vegetation. - Rpt 2D echo was on 05/19/2020 that showed an EF of 30%, with persistent tricuspid vegetation measuring 1.8 x 0.8 cm-to the posterior leaflet with severe tricuspid regurgitation. - CTS has seen, will f/u outpt for consideration of TV replacement after he has completed drug-rehab - ID has transitioned him to dapt/doxy - rpt Bld Cx (8/2) showing bacillus -8/10: No acute events ON. Continuing IV abx though 8/18. Will need to notify ID on 8/16 of upcomming discharge so that a final determination on course/duration of PO abx for discharge can be made.   Acute respiratory failure with hypoxia - required intubation; extubated on 05/18/2020 - Resolved  Septic shock requiring  pressors: - Secondary to MRSA bacteremia on admission started on pressors weaned, then off pressors and transfer to progressive unit on 05/20/2020. - Continue current dapto/doxy; will need at least 6 weeks of antibiotics. - follow CPK per pharm protocol  Acute systolic heart failure question septic cardiomyopathy - Initial 2D echo on admission showed an EF of 60% - repeat a 2D echo on 05/14/2020 showed a reduced EF of 20%.  - He was started on IV Lasix with good diuresis, once he was close to euvolemia it was stopped. -metoprolol, entresto - follow I&O, daily wts -titrate metoprolol as able.  Normocytic anemia: - Hgbstable. No signs of bleed. Follow  Hypokalemia: - resolved, follow K+  Substance abuse disorder: - Patient admits to opiates and cocaine abuse. - needs outpt counseling, rehab  Bilateral wrist abscess (MSSA): - Orthopedic surgery was consulted and he was I&D on 05/03/2020 cultures grew MSSA. - Suture removal by orthopedic surgery.  Malnutrition of moderate degree - Ensure TID  DVT prophylaxis: lovenox Code Status: FULL Family Communication: None at bedside.   Status is: Inpatient  Remains inpatient appropriate because:Inpatient level of care appropriate due to severity of illness   Dispo: The patient is from: Home              Anticipated d/c is to: Home              Anticipated d/c date is: > 3 days              Patient currently is not medically stable to d/c.  Consultants:   ID  CTS  Cardiology  Pulm/CC  Orthopedics  Antimicrobials:  . Dapt, doxy   ROS:  Denies dyspnea, ab pain, CP, N, V. Remainder ROS is negative for all not previously mentioned.  Subjective: "Thanks. I'm fine."  Objective: Vitals:   05/31/20 0119 05/31/20 0550 05/31/20 0556 05/31/20 0920  BP:  95/61  96/74  Pulse:    (!) 117  Resp: 18 19    Temp:  97.8 F (36.6 C)  98 F (36.7 C)  TempSrc:   Oral  Oral  SpO2:  98%  99%  Weight:   53.2 kg   Height:        Intake/Output Summary (Last 24 hours) at 05/31/2020 1342 Last data filed at 05/31/2020 1254 Gross per 24 hour  Intake 1540.34 ml  Output 1325 ml  Net 215.34 ml   Filed Weights   05/29/20 0444 05/30/20 0417 05/31/20 0556  Weight: 53 kg 52.9 kg 53.2 kg    Examination:  General: 29 y.o. male resting in bed in NAD Cardiovascular: tachy, +S1, S2, no m/g/r, equal pulses throughout Respiratory: CTABL, no w/r/r, normal WOB GI: BS+, NDNT, no masses noted, no organomegaly noted MSK: No e/c/c Neuro: A&O x 3, no focal deficits Psyc: Appropriate interaction and affect, calm/cooperative  Data Reviewed: I have personally reviewed following labs and imaging studies.  CBC: Recent Labs  Lab 05/27/20 0716 05/28/20 0632 05/29/20 0931 05/30/20 0456 05/31/20 1113  WBC 12.3* 11.6* 11.4* 12.6* 11.6*  NEUTROABS 7.7 6.7 6.3 6.6 6.4  HGB 10.3* 11.1* 10.6* 10.5* 10.1*  HCT 33.2* 35.0* 33.5* 33.4* 33.4*  MCV 87.6 87.1 87.5 89.3 89.3  PLT 319 356 343 334 349   Basic Metabolic Panel: Recent Labs  Lab 05/26/20 0824 05/26/20 0824 05/27/20 0716 05/28/20 0632 05/29/20 0931 05/30/20 0456 05/31/20 1100  NA 137   < > 137 135 138 138 135  K 4.5   < > 4.8 4.5 4.0 4.5 4.5  CL 98   < > 102 99 101 103 101  CO2 28   < > 25 27 24 26 25   GLUCOSE 103*   < > 108* 99 119* 96 102*  BUN 22*   < > 26* 26* 20 21* 25*  CREATININE 0.77   < > 0.76 0.74 0.68 0.80 0.66  CALCIUM 9.6   < > 9.6 9.4 9.1 9.6 9.3  MG 2.2  --  2.0 2.2 1.8 1.9  --   PHOS 4.3   < > 3.4 4.4 3.2 4.0 3.4   < > = values in this interval not displayed.   GFR: Estimated Creatinine Clearance: 101.6 mL/min (by C-G formula based on SCr of 0.66 mg/dL). Liver Function Tests: Recent Labs  Lab 05/25/20 0555 05/26/20 0824 05/27/20 0716 05/28/20 07/28/20 05/29/20 0931 05/30/20 0456 05/31/20 1100  AST 84*  --   --   --   --   --   --   ALT 114*  --   --   --   --   --   --     ALKPHOS 95  --   --   --   --   --   --   BILITOT 0.4  --   --   --   --   --   --   PROT 7.6  --   --   --   --   --   --   ALBUMIN 2.7*   < > 2.7* 2.9* 2.7* 2.8* 2.8*   < > = values in this interval not displayed.  No results for input(s): LIPASE, AMYLASE in the last 168 hours. No results for input(s): AMMONIA in the last 168 hours. Coagulation Profile: No results for input(s): INR, PROTIME in the last 168 hours. Cardiac Enzymes: Recent Labs  Lab 05/25/20 0555  CKTOTAL 8*   BNP (last 3 results) No results for input(s): PROBNP in the last 8760 hours. HbA1C: No results for input(s): HGBA1C in the last 72 hours. CBG: Recent Labs  Lab 05/26/20 2039 05/27/20 0043 05/27/20 0518 05/27/20 0736 05/27/20 1133  GLUCAP 130* 122* 103* 100* 95   Lipid Profile: No results for input(s): CHOL, HDL, LDLCALC, TRIG, CHOLHDL, LDLDIRECT in the last 72 hours. Thyroid Function Tests: No results for input(s): TSH, T4TOTAL, FREET4, T3FREE, THYROIDAB in the last 72 hours. Anemia Panel: No results for input(s): VITAMINB12, FOLATE, FERRITIN, TIBC, IRON, RETICCTPCT in the last 72 hours. Sepsis Labs: No results for input(s): PROCALCITON, LATICACIDVEN in the last 168 hours.  Recent Results (from the past 240 hour(s))  Culture, blood (routine x 2)     Status: None   Collection Time: 05/23/20  9:16 AM   Specimen: BLOOD LEFT HAND  Result Value Ref Range Status   Specimen Description BLOOD LEFT HAND  Final   Special Requests   Final    BOTTLES DRAWN AEROBIC ONLY Blood Culture adequate volume   Culture   Final    NO GROWTH 5 DAYS Performed at [facility], [address] [place], [place] [address]    Report Status 05/28/2020 FINAL  Final  Culture, blood (routine x 2)     Status: Abnormal   Collection Time: 05/23/20  9:16 AM   Specimen: BLOOD LEFT HAND  Result Value Ref Range Status   Specimen Description BLOOD LEFT HAND  Final   Special Requests   Final    BOTTLES DRAWN AEROBIC ONLY  Blood Culture adequate volume   Culture  Setup Time   Final    GRAM POSITIVE RODS AEROBIC BOTTLE ONLY CRITICAL RESULT CALLED TO, READ BACK BY AND VERIFIED WITH: PHARMD A WOLFE CALLED 8 3 21  AT 1447 SK    Culture (A)  Final    BACILLUS SPECIES Standardized susceptibility testing for this organism is not available. Performed at [facility], [address], [place], [place]    Report Status 05/25/2020 FINAL  Final      Radiology Studies: No results found.   Scheduled Meds: . docusate sodium  100 mg Oral BID  . doxycycline  100 mg Oral Q12H  . enoxaparin (LOVENOX) injection  40 mg Subcutaneous Q24H  . feeding supplement (ENSURE ENLIVE)  237 mL Oral TID BM  . mouth rinse  15 mL Mouth Rinse BID  . metoprolol tartrate  12.5 mg Oral BID  . multivitamin with minerals  1 tablet Oral Daily  . pantoprazole  40 mg Oral Daily  . polyethylene glycol  17 g Oral Daily  . potassium chloride  40 mEq Oral Q12H  . sacubitril-valsartan  1 tablet Oral BID  . sodium chloride flush  10-40 mL Intracatheter Q12H  . sodium chloride flush  3 mL Intravenous Q12H   Continuous Infusions: . sodium chloride Stopped (05/30/20 2243)  . DAPTOmycin (CUBICIN)  IV Stopped (05/30/20 2242)     LOS: 33 days    Time spent: 25 minutes spent in the coordination of care today.    07/30/20, DO [facility]  If 7PM-7AM, please contact night-coverage [email or web address] 05/31/2020, 1:42 PM

## 2020-05-31 NOTE — Progress Notes (Signed)
   05/31/20 0920  Assess: MEWS Score  Temp 98 F (36.7 C)  BP 96/74  Pulse Rate (!) 117  Level of Consciousness Alert  SpO2 99 %  O2 Device Room Air  Assess: MEWS Score  MEWS Temp 0  MEWS Systolic 1  MEWS Pulse 2  MEWS RR 0  MEWS LOC 0  MEWS Score 3  MEWS Score Color Yellow  Assess: if the MEWS score is Yellow or Red  Were vital signs taken at a resting state? Yes  Focused Assessment No change from prior assessment  Early Detection of Sepsis Score *See Row Information* Low  MEWS guidelines implemented *See Row Information* No, previously yellow, continue vital signs every 4 hours  Treat  MEWS Interventions Administered scheduled meds/treatments  Pain Scale 0-10  Pain Score 0  Document  Progress note created (see row info) Yes   Patient has had an elevated HR. Scheduled beta blocker given with morning labs. Will continue to monitor HR and administer PRN betablocker as needed

## 2020-05-31 NOTE — Progress Notes (Signed)
   05/30/20 1942  Assess: MEWS Score  Temp 97.9 F (36.6 C)  BP (!) 96/56  ECG Heart Rate (!) 121  Level of Consciousness Alert  SpO2 97 %  O2 Device Room Air  Assess: MEWS Score  MEWS Temp 0  MEWS Systolic 1  MEWS Pulse 2  MEWS RR 0  MEWS LOC 0  MEWS Score 3  MEWS Score Color Yellow  Treat  Pain Scale 0-10  Pain Score 0

## 2020-05-31 NOTE — Progress Notes (Signed)
Pt refused AM lab draw. RN has instructed phlebotomy to reschedule AM labs for later this morning.

## 2020-06-01 LAB — FUNGUS CULTURE WITH STAIN

## 2020-06-01 LAB — CBC WITH DIFFERENTIAL/PLATELET
Abs Immature Granulocytes: 0.8 10*3/uL — ABNORMAL HIGH (ref 0.00–0.07)
Basophils Absolute: 0.1 10*3/uL (ref 0.0–0.1)
Basophils Relative: 1 %
Eosinophils Absolute: 0.6 10*3/uL — ABNORMAL HIGH (ref 0.0–0.5)
Eosinophils Relative: 5 %
HCT: 33.6 % — ABNORMAL LOW (ref 39.0–52.0)
Hemoglobin: 10.3 g/dL — ABNORMAL LOW (ref 13.0–17.0)
Lymphocytes Relative: 17 %
Lymphs Abs: 2.2 10*3/uL (ref 0.7–4.0)
MCH: 27.2 pg (ref 26.0–34.0)
MCHC: 30.7 g/dL (ref 30.0–36.0)
MCV: 88.9 fL (ref 80.0–100.0)
Metamyelocytes Relative: 1 %
Monocytes Absolute: 0.9 10*3/uL (ref 0.1–1.0)
Monocytes Relative: 7 %
Myelocytes: 5 %
Neutro Abs: 8.3 10*3/uL — ABNORMAL HIGH (ref 1.7–7.7)
Neutrophils Relative %: 64 %
Platelets: 334 10*3/uL (ref 150–400)
RBC: 3.78 MIL/uL — ABNORMAL LOW (ref 4.22–5.81)
RDW: 16.8 % — ABNORMAL HIGH (ref 11.5–15.5)
WBC: 12.9 10*3/uL — ABNORMAL HIGH (ref 4.0–10.5)
nRBC: 0 % (ref 0.0–0.2)
nRBC: 0 /100 WBC

## 2020-06-01 LAB — RENAL FUNCTION PANEL
Albumin: 2.8 g/dL — ABNORMAL LOW (ref 3.5–5.0)
Anion gap: 8 (ref 5–15)
BUN: 25 mg/dL — ABNORMAL HIGH (ref 6–20)
CO2: 27 mmol/L (ref 22–32)
Calcium: 9.7 mg/dL (ref 8.9–10.3)
Chloride: 102 mmol/L (ref 98–111)
Creatinine, Ser: 0.7 mg/dL (ref 0.61–1.24)
GFR calc Af Amer: >60 mL/min (ref >60)
GFR calc non Af Amer: >60 mL/min (ref >60)
Glucose, Bld: 86 mg/dL (ref 70–99)
Phosphorus: 4.5 mg/dL (ref 2.5–4.6)
Potassium: 4.6 mmol/L (ref 3.5–5.1)
Sodium: 137 mmol/L (ref 135–145)

## 2020-06-01 LAB — MAGNESIUM: Magnesium: 2.1 mg/dL (ref 1.7–2.4)

## 2020-06-01 LAB — CK: Total CK: 15 U/L — ABNORMAL LOW (ref 49–397)

## 2020-06-01 LAB — FUNGUS CULTURE RESULT

## 2020-06-01 LAB — FUNGAL ORGANISM REFLEX

## 2020-06-01 NOTE — Care Management (Signed)
Requested CMA to schedule for PCP at [facility].

## 2020-06-01 NOTE — Progress Notes (Signed)
Pt communicated desire to go to drug rehab after admission  Per request of his lawyer. Pt asking to speak with his social worker about planning this for d/c. Will discuss with Child psychotherapist tomorrow.

## 2020-06-01 NOTE — Progress Notes (Signed)
PROGRESS NOTE    Clifford Clark  ZSM:[id number] DOB: 05-12-1991 DOA: 04/28/2020 PCP: Patient, [name]    Brief Narrative:  29 y.o.malepast medical history of IVDA found on side of the road brought into the ED was found to have multiple septic emboli and cavitary lung lesions cultures growing MRSA bacteremia. 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 2D echo showed an EF of 60% with a PFO and a large vegetation in the tricuspid valve. He underwent AngioJet removal of part of the vegetation,repeat a 2D echo showed an EF of 20%. 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 - s/p debulking with angio vac on 05/10/2020 due to large vegetation. - Rpt 2D echo was on 05/19/2020 that showed an EF of 30%, with persistent tricuspid vegetation measuring 1.8 x 0.8 cm-to the posterior leaflet with severe tricuspid regurgitation. - CTS has seen, will f/u outpt for consideration of TV replacement after he has completed drug-rehab - ID has transitioned him to daptomycin monotherapy - rpt Bld Cx (8/2) showing bacillus -Per ID, plan to reach out to ID closer to  8/18 to determine long-term plan for abx regimen   Acute respiratory failure with hypoxia - required intubation; extubated on 05/18/2020 - Resolved  Septic shock requiring pressors: - Secondary to MRSA bacteremia on admission started on pressors weaned, then off pressors and transfer to progressive unit on 05/20/2020. - shock resolved -cont above abx as tolerated  Acute systolic heart failure question septic cardiomyopathy - Initial 2D echo on admission showed an EF of 60% - repeat a 2D echo on 05/14/2020 showed a reduced EF of 20%.  - He was started on IV Lasix with good diuresis, once he was close to euvolemia it was stopped. -metoprolol, entresto - follow I&O, daily wts -cont metoprolol as tolerated  Normocytic anemia: - Hgbstable. No signs of bleed. Follow  Hypokalemia: - resolved, follow K+  Substance abuse disorder: - Patient admited to opiates and cocaine abuse. - needs outpt counseling, rehab  Bilateral wrist abscess (MSSA): - Orthopedic surgery was consulted and he was I&D on 05/03/2020 cultures grew MSSA. - Suture removal by orthopedic surgery.  Malnutrition of moderate degree - Ensure TID  DVT prophylaxis: Lovenox subq Code Status: Full Family Communication: Pt in room, family not at bedside  Status is: Inpatient  Remains inpatient appropriate because:Unsafe d/c plan and IV treatments appropriate due to intensity of illness or inability to take PO   Dispo: The patient is from: Home              Anticipated d/c is to: Home  Anticipated d/c date is: > 3 days              Patient currently is not medically stable to d/c.       Consultants:   ID  CTS  Cardiology  Pulm/CC  Orthopedics  Procedures:     Antimicrobials: Anti-infectives (From admission, onward)   Start     Dose/Rate Route Frequency Ordered Stop   05/25/20 1000  doxycycline (VIBRA-TABS)  tablet 100 mg     Discontinue     100 mg Oral Every 12 hours 05/24/20 1135     05/24/20 1145  doxycycline (VIBRA-TABS) tablet 100 mg  Status:  Discontinued        100 mg Oral Every 12 hours 05/24/20 1048 05/24/20 1135   05/18/20 2000  DAPTOmycin (CUBICIN) 500 mg in sodium chloride 0.9 % IVPB     Discontinue     500 mg 220 mL/hr over 30 Minutes Intravenous Daily 05/18/20 1131     05/18/20 1400  ceftaroline (TEFLARO) 600 mg in sodium chloride 0.9 % 250 mL IVPB        600 mg 250 mL/hr over 60 Minutes Intravenous Every 8 hours 05/18/20 1131 05/24/20 2248   05/13/20 1730  meropenem (MERREM) 1 g in sodium chloride 0.9 % 100 mL IVPB  Status:  Discontinued        1 g 200 mL/hr over 30 Minutes Intravenous Every 8 hours 05/13/20 1720 05/14/20 1154   05/12/20 1600  vancomycin (VANCOREADY) IVPB 750 mg/150 mL  Status:  Discontinued        750 mg 150 mL/hr over 60 Minutes Intravenous Every 8 hours 05/12/20 1509 05/18/20 1131   05/05/20 1600  ceFAZolin (ANCEF) IVPB 2g/100 mL premix  Status:  Discontinued        2 g 200 mL/hr over 30 Minutes Intravenous Every 8 hours 05/05/20 1452 05/12/20 1509   05/04/20 1200  vancomycin (VANCOCIN) IVPB 1000 mg/200 mL premix  Status:  Discontinued        1,000 mg 200 mL/hr over 60 Minutes Intravenous Every 12 hours 05/04/20 1045 05/04/20 1055   05/04/20 1200  vancomycin (VANCOREADY) IVPB 750 mg/150 mL  Status:  Discontinued        750 mg 150 mL/hr over 60 Minutes Intravenous Every 8 hours 05/04/20 1055 05/05/20 1452   05/03/20 0945  vancomycin (VANCOCIN) powder  Status:  Discontinued          As needed 05/03/20 0945 05/03/20 1057   05/03/20 0920  ceFAZolin (ANCEF) 2-4 GM/100ML-% IVPB       Note to Pharmacy: Viviano Simas   : cabinet override      05/03/20 0920 05/03/20 2129   05/02/20 1500  vancomycin (VANCOREADY) IVPB 750 mg/150 mL  Status:  Discontinued        750 mg 150 mL/hr over 60 Minutes Intravenous Every 12 hours 05/02/20 1425 05/04/20 1045   05/01/20  1330  vancomycin variable dose per unstable renal function (pharmacist dosing)  Status:  Discontinued         Does not apply See admin instructions 05/01/20 1330 05/03/20 0810   04/29/20 1000  vancomycin (VANCOREADY) IVPB 750 mg/150 mL  Status:  Discontinued        750 mg 150 mL/hr over 60 Minutes Intravenous Every 8 hours 04/29/20 0834 05/01/20 1329   04/29/20 0400  metroNIDAZOLE (FLAGYL) IVPB 500 mg  Status:  Discontinued        500 mg 100 mL/hr over 60 Minutes Intravenous  Every 8 hours 04/28/20 2122 04/29/20 0819   04/29/20 0400  ceFEPIme (MAXIPIME) 2 g in sodium chloride 0.9 % 100 mL IVPB  Status:  Discontinued        2 g 200 mL/hr over 30 Minutes Intravenous Every 8 hours 04/28/20 2138 04/29/20 0819   04/29/20 0100  vancomycin (VANCOCIN) IVPB 1000 mg/200 mL premix  Status:  Discontinued        1,000 mg 200 mL/hr over 60 Minutes Intravenous Every 8 hours 04/28/20 2146 04/29/20 0833   04/28/20 1715  ceFEPIme (MAXIPIME) 2 g in sodium chloride 0.9 % 100 mL IVPB        2 g 200 mL/hr over 30 Minutes Intravenous  Once 04/28/20 1708 04/28/20 1903   04/28/20 1715  metroNIDAZOLE (FLAGYL) IVPB 500 mg        500 mg 100 mL/hr over 60 Minutes Intravenous  Once 04/28/20 1708 04/28/20 2111   04/28/20 1715  vancomycin (VANCOCIN) IVPB 1000 mg/200 mL premix        1,000 mg 200 mL/hr over 60 Minutes Intravenous  Once 04/28/20 1708 04/28/20 1947       Subjective: Without complaints at this time  Objective: Vitals:   06/01/20 0126 06/01/20 0543 06/01/20 1500 06/01/20 1655  BP: (!) 91/55 97/65 (!) 88/56 (!) 92/57  Pulse: (!) 107 (!) 107 (!) 118   Resp: (!) 21 17 20    Temp: (!) 97.1 F (36.2 C) (!) 97.4 F (36.3 C) 98.1 F (36.7 C)   TempSrc: Oral Oral Oral   SpO2: 100% 97% 98%   Weight:  54.4 kg    Height:        Intake/Output Summary (Last 24 hours) at 06/01/2020 1733 Last data filed at 06/01/2020 1500 Gross per 24 hour  Intake 1263.06 ml  Output 700 ml  Net 563.06 ml   Filed  Weights   05/30/20 0417 05/31/20 0556 06/01/20 0543  Weight: 52.9 kg 53.2 kg 54.4 kg    Examination:  General exam: Appears calm and comfortable  Respiratory system: Clear to auscultation. Respiratory effort normal. Cardiovascular system: S1 & S2 heard, Regular Gastrointestinal system: Abdomen is nondistended, soft and nontender. No organomegaly or masses felt. Normal bowel sounds heard. Central nervous system: Alert and oriented. No focal neurological deficits. Extremities: Symmetric 5 x 5 power. Skin: No rashes, lesions  Psychiatry: Judgement and insight appear normal. Mood & affect appropriate.   Data Reviewed: I have personally reviewed following labs and imaging studies  CBC: Recent Labs  Lab 05/28/20 0632 05/29/20 0931 05/30/20 0456 05/31/20 1113 06/01/20 0500  WBC 11.6* 11.4* 12.6* 11.6* 12.9*  NEUTROABS 6.7 6.3 6.6 6.4 8.3*  HGB 11.1* 10.6* 10.5* 10.1* 10.3*  HCT 35.0* 33.5* 33.4* 33.4* 33.6*  MCV 87.1 87.5 89.3 89.3 88.9  PLT 356 343 334 349 334   Basic Metabolic Panel: Recent Labs  Lab 05/27/20 0716 05/27/20 0716 05/28/20 0632 05/29/20 0931 05/30/20 0456 05/31/20 1100 06/01/20 0500  NA 137   < > 135 138 138 135 137  K 4.8   < > 4.5 4.0 4.5 4.5 4.6  CL 102   < > 99 101 103 101 102  CO2 25   < > 27 24 26 25 27   GLUCOSE 108*   < > 99 119* 96 102* 86  BUN 26*   < > 26* 20 21* 25* 25*  CREATININE 0.76   < > 0.74 0.68 0.80 0.66 0.70  CALCIUM 9.6   < > 9.4 9.1 9.6 9.3  9.7  MG 2.0  --  2.2 1.8 1.9  --  2.1  PHOS 3.4   < > 4.4 3.2 4.0 3.4 4.5   < > = values in this interval not displayed.   GFR: Estimated Creatinine Clearance: 103.9 mL/min (by C-G formula based on SCr of 0.7 mg/dL). Liver Function Tests: Recent Labs  Lab 05/28/20 7829 05/29/20 0931 05/30/20 0456 05/31/20 1100 06/01/20 0500  ALBUMIN 2.9* 2.7* 2.8* 2.8* 2.8*   No results for input(s): LIPASE, AMYLASE in the last 168 hours. No results for input(s): AMMONIA in the last 168  hours. Coagulation Profile: No results for input(s): INR, PROTIME in the last 168 hours. Cardiac Enzymes: Recent Labs  Lab 06/01/20 1130  CKTOTAL 15*   BNP (last 3 results) No results for input(s): PROBNP in the last 8760 hours. HbA1C: No results for input(s): HGBA1C in the last 72 hours. CBG: Recent Labs  Lab 05/26/20 2039 05/27/20 0043 05/27/20 0518 05/27/20 0736 05/27/20 1133  GLUCAP 130* 122* 103* 100* 95   Lipid Profile: No results for input(s): CHOL, HDL, LDLCALC, TRIG, CHOLHDL, LDLDIRECT in the last 72 hours. Thyroid Function Tests: No results for input(s): TSH, T4TOTAL, FREET4, T3FREE, THYROIDAB in the last 72 hours. Anemia Panel: No results for input(s): VITAMINB12, FOLATE, FERRITIN, TIBC, IRON, RETICCTPCT in the last 72 hours. Sepsis Labs: No results for input(s): PROCALCITON, LATICACIDVEN in the last 168 hours.  Recent Results (from the past 240 hour(s))  Culture, blood (routine x 2)     Status: None   Collection Time: 05/23/20  9:16 AM   Specimen: BLOOD LEFT HAND  Result Value Ref Range Status   Specimen Description BLOOD LEFT HAND  Final   Special Requests   Final    BOTTLES DRAWN AEROBIC ONLY Blood Culture adequate volume   Culture   Final    NO GROWTH 5 DAYS Performed at [facility], [address], [place], [place] [address]    Report Status 05/28/2020 FINAL  Final  Culture, blood (routine x 2)     Status: Abnormal   Collection Time: 05/23/20  9:16 AM   Specimen: BLOOD LEFT HAND  Result Value Ref Range Status   Specimen Description BLOOD LEFT HAND  Final   Special Requests   Final    BOTTLES DRAWN AEROBIC ONLY Blood Culture adequate volume   Culture  Setup Time   Final    GRAM POSITIVE RODS AEROBIC BOTTLE ONLY CRITICAL RESULT CALLED TO, READ BACK BY AND VERIFIED WITH: PHARMD A WOLFE CALLED AT 1447 SK    Culture (A)  Final    BACILLUS SPECIES Standardized susceptibility testing for this organism is not available. Performed at  [facility], [address], [place], [place] [address]    Report Status 05/25/2020 FINAL  Final     Radiology Studies: No results found.  Scheduled Meds: . docusate sodium  100 mg Oral BID  . doxycycline  100 mg Oral Q12H  . enoxaparin (LOVENOX) injection  40 mg Subcutaneous Q24H  . feeding supplement (ENSURE ENLIVE)  237 mL Oral TID BM  . mouth rinse  15 mL Mouth Rinse BID  . metoprolol tartrate  12.5 mg Oral BID  . multivitamin with minerals  1 tablet Oral Daily  . pantoprazole  40 mg Oral Daily  . polyethylene glycol  17 g Oral Daily  . sacubitril-valsartan  1 tablet Oral BID  . sodium chloride flush  10-40 mL Intracatheter Q12H  .  sodium chloride flush  3 mL Intravenous Q12H   Continuous Infusions: . sodium chloride Stopped (05/30/20 2243)  . DAPTOmycin (CUBICIN)  IV Stopped (05/31/20 2229)     LOS: 34 days   Rickey BarbaraStephen Pearlean Sabina, MD [facility] Pager On Amion  If 7PM-7AM, please contact night-coverage 06/01/2020, 5:33 PM

## 2020-06-02 MED ORDER — ZOLPIDEM TARTRATE 5 MG PO TABS
5.0000 mg | ORAL_TABLET | Freq: Every evening | ORAL | Status: DC | PRN
  Administered 2020-06-02 – 2020-06-14 (×12): 5 mg via ORAL
  Filled 2020-06-02 (×12): qty 1

## 2020-06-02 NOTE — Progress Notes (Signed)
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 s/p angiovac by CVTS 7/20. Intubated 7/23-7/28. PMHx: IVDU, asthma    PT Comments    Pt admitted with above diagnosis. Pt was able to ambulate in hallway without device with good balance overall.  Can withstand min challenges and performed a flight of steps with superviison which did wear pt out.  Pt met 5/5 goals therefore goals revised today.  Will continue to progress pt.  Pt currently with functional limitations due to balance and endurance deficits. Pt will benefit from skilled PT to increase their independence and safety with mobility to allow discharge to the venue listed below.     Follow Up Recommendations  Home health PT;Supervision/Assistance - 24 hour     Equipment Recommendations  None recommended by PT    Recommendations for Other Services OT consult     Precautions / Restrictions Precautions Precautions: Fall Precaution Comments: monitor HR, BP Restrictions Weight Bearing Restrictions: No    Mobility  Bed Mobility               General bed mobility comments: Pt in chair  Transfers Overall transfer level: Needs assistance Equipment used: None Transfers: Sit to/from Stand Sit to Stand: Min guard         General transfer comment: No difficulty standing from recliner  Ambulation/Gait Ambulation/Gait assistance: Min guard;Supervision Gait Distance (Feet): 550 Feet Assistive device: None Gait Pattern/deviations: Step-through pattern;Decreased stride length Gait velocity: decreased Gait velocity interpretation: 1.31 - 2.62 ft/sec, indicative of limited community ambulator General Gait Details: Able to ambulate without RW.  Did not have LOB.  can withstand min  challenges to balance.  Hr 105-135 bpm with stair training.    Stairs Stairs: Yes Stairs assistance: Supervision Stair Management: One rail Right;Step to pattern;Alternating pattern;Forwards Number of Stairs: 20 General stair comments: Pt alternated steps going up and step to coming down.     Wheelchair Mobility    Modified Rankin (Stroke Patients Only)       Balance Overall balance assessment: Needs assistance Sitting-balance support: No upper extremity supported;Feet supported Sitting balance-Leahy Scale: Good     Standing balance support: No upper extremity supported Standing balance-Leahy Scale: Good Standing balance comment: Static stand and lower level balance task steadily                            Cognition Arousal/Alertness: Awake/alert Behavior During Therapy: WFL for tasks assessed/performed Overall Cognitive Status: Impaired/Different from baseline Area of Impairment: Safety/judgement;Problem solving                 Orientation Level: Disoriented to;Time Current Attention Level: Sustained     Safety/Judgement: Decreased awareness of safety;Decreased awareness of deficits Awareness: Emergent Problem Solving: Requires verbal cues General Comments: Difficulty with higher level problem solving, able to correct with verbal cues      Exercises      General Comments        Pertinent Vitals/Pain Pain Assessment: No/denies pain    Home Living                      Prior Function            PT  Goals (current goals can now be found in the care plan section) Acute Rehab PT Goals Patient Stated Goal: return to independence/drug rehab PT Goal Formulation: With patient Time For Goal Achievement: 06/16/20 Potential to Achieve Goals: Good Progress towards PT goals: Progressing toward goals    Frequency    Min 3X/week      PT Plan Current plan remains appropriate    Co-evaluation              AM-PAC PT "6  Clicks" Mobility   Outcome Measure  Help needed turning from your back to your side while in a flat bed without using bedrails?: None Help needed moving from lying on your back to sitting on the side of a flat bed without using bedrails?: None Help needed moving to and from a bed to a chair (including a wheelchair)?: None Help needed standing up from a chair using your arms (e.g., wheelchair or bedside chair)?: None Help needed to walk in hospital room?: None Help needed climbing 3-5 steps with a railing? : A Little 6 Click Score: 23    End of Session Equipment Utilized During Treatment: Gait belt Activity Tolerance: Patient tolerated treatment well Patient left: with call bell/phone within reach;in chair Nurse Communication: Mobility status PT Visit Diagnosis: Other abnormalities of gait and mobility (R26.89);Difficulty in walking, not elsewhere classified (R26.2)     Time: 2060-1561 PT Time Calculation (min) (ACUTE ONLY): 14 min  Charges:  $Gait Training: 8-22 mins                     Angello Chien W,PT [place] Pager:  [phone]  Office:  [place] 06/02/2020, 1:06 PM

## 2020-06-02 NOTE — Progress Notes (Signed)
°   06/01/20 2121  Assess: MEWS Score  Temp 98.3 F (36.8 C)  BP (!) 96/59  Pulse Rate (!) 117  Resp 19  SpO2 100 %  O2 Device Room Air  Assess: MEWS Score  MEWS Temp 0  MEWS Systolic 1  MEWS Pulse 2  MEWS RR 0  MEWS LOC 0  MEWS Score 3  MEWS Score Color Yellow  Assess: if the MEWS score is Yellow or Red  Were vital signs taken at a resting state? Yes  Focused Assessment No change from prior assessment  Early Detection of Sepsis Score *See Row Information* Low  MEWS guidelines implemented *See Row Information* No, previously yellow, continue vital signs every 4 hours  Treat  MEWS Interventions Other (Comment) (On going Yellow MEW)  Pain Scale 0-10  Pain Score 0  Escalate  MEWS: Escalate Yellow: discuss with charge nurse/RN and consider discussing with provider and RRT  Notify: Charge Nurse/RN  Name of Charge Nurse/RN Notified [place], RN  Date Charge Nurse/RN Notified 06/02/20  Time Charge Nurse/RN Notified 2130  Document  Patient Outcome Other (Comment) (On going Yellow MEW)  Progress note created (see row info) Yes

## 2020-06-02 NOTE — Progress Notes (Signed)
Nutrition Follow-up  DOCUMENTATION CODES:   Non-severe (moderate) malnutrition in context of social or environmental circumstances  INTERVENTION:   -ContinueMVI with minerals daily -ContinueEnsure Enlive poTID, each supplement provides 350 kcal and 20 grams of protein -ContinueMagic cup TID with meals, each supplement provides 290 kcal and 9 grams of protein  NUTRITION DIAGNOSIS:   Moderate Malnutrition related to social / environmental circumstances as evidenced by energy intake < 75% for > or equal to 3 months, moderate fat depletion, moderate muscle depletion, percent weight loss.  Ongoing  GOAL:   Patient will meet greater than or equal to 90% of their needs  Progressing   MONITOR:   PO intake, Supplement acceptance, Labs, Weight trends  REASON FOR ASSESSMENT:   Consult Assessment of nutrition requirement/status  ASSESSMENT:   Pt admitted for multiple septic emboli suspicious for possible endocarditis in the setting of IV drug use resulting in sepsis. PMH significant for polysubstance abuse.  7/13 - s/p I&D of bilateral hand abscesses 7/16 - s/p TEE confirming TV vegetation and trace pulmonic valve vegetation 7/20 - s/p angiovac debridement of tricuspid valve 7/23- intubated 7/28- extubated 7/29- advanced to Heart Healthy diet  Reviewed I/O's: +489 ml x 24 hours and -13 L since 05/19/20  UOP: 300 ml x 24 hours  Attempted to speak with pt via phone call to hospital room, however, no answer.   Per ID notes, plan to remains hospitalized until 06/08/20, for completion of IV antibiotics.   Pt remains with good meal intake; noted PO 80-100%. Pt is accepting Ensure supplements per MAR.   Medications reviewed and include miralax.   Labs reviewed.   Diet Order:   Diet Order            Diet Heart Room service appropriate? Yes; Fluid consistency: Thin  Diet effective now                 EDUCATION NEEDS:   Not appropriate for education at this  time  Skin:  Skin Assessment: Skin Integrity Issues: Skin Integrity Issues:: Incisions Stage II: - Incisions: lt arm, rt arm, rt neck, rt groin  Last BM:  06/01/20  Height:   Ht Readings from Last 1 Encounters:  05/13/20 5\' 7"  (1.702 m)    Weight:   Wt Readings from Last 1 Encounters:  06/02/20 54.7 kg   BMI:  Body mass index is 18.87 kg/m.  Estimated Nutritional Needs:   Kcal:  1850-2050  Protein:  105-120 grams  Fluid:  > 1.8 L    08/02/20, RD, LDN, CDCES Registered Dietitian II Certified Diabetes Care and Education Specialist Please refer to [facility] for RD and/or RD on-call/weekend/after hours pager

## 2020-06-02 NOTE — Progress Notes (Signed)
   06/02/20 1108  Assess: MEWS Score  Temp 97.6 F (36.4 C)  BP (!) 97/53  Pulse Rate (!) 105  Resp 18  Level of Consciousness Alert  SpO2 99 %  O2 Device Room Air  Patient Activity (if Appropriate) In chair  Assess: MEWS Score  MEWS Temp 0  MEWS Systolic 1  MEWS Pulse 1  MEWS RR 0  MEWS LOC 0  MEWS Score 2  MEWS Score Color Yellow  Assess: if the MEWS score is Yellow or Red  Were vital signs taken at a resting state? Yes  Focused Assessment No change from prior assessment  Early Detection of Sepsis Score *See Row Information* Low  MEWS guidelines implemented *See Row Information* No, previously yellow, continue vital signs every 4 hours  Treat  MEWS Interventions Administered scheduled meds/treatments  Pain Scale 0-10  Pain Score 0   Pt has had an elevated HR. Scheduled beta blocker given. Will continue to monitor HR and administer PRN beta blocker as needed.

## 2020-06-02 NOTE — Plan of Care (Signed)
  Problem: Activity: Goal: Risk for activity intolerance will decrease Outcome: Progressing   Problem: Nutrition: Goal: Adequate nutrition will be maintained Outcome: Progressing   

## 2020-06-02 NOTE — Progress Notes (Signed)
PROGRESS NOTE    Clifford Clark  ZOX:[id number] DOB: Aug 16, 1991 DOA: 04/28/2020 PCP: Patient, [name]    Brief Narrative:  29 y.o.malepast medical history of IVDA found on side of the road brought into the ED was found to have multiple septic emboli and cavitary lung lesions cultures growing MRSA bacteremia. 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 2D echo showed an EF of 60% with a PFO and a large vegetation in the tricuspid valve. He underwent AngioJet removal of part of the vegetation,repeat a 2D echo showed an EF of 20%. 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 - s/p debulking with angio vac on 05/10/2020 due to large vegetation. - Rpt 2D echo was on 05/19/2020 that showed an EF of 30%, with persistent tricuspid vegetation measuring 1.8 x 0.8 cm-to the posterior leaflet with severe tricuspid regurgitation. - CTS has seen, will f/u outpt for consideration of TV replacement after he has completed drug-rehab - ID has transitioned him to daptomycin monotherapy - rpt Bld Cx (8/2) showing bacillus -Per ID, plan to reach out to ID closer to  8/18 to determine long-term plan for abx regimen. Stable at this time  Acute respiratory failure with hypoxia - required intubation; extubated on 05/18/2020 - Resolved  Septic shock requiring pressors: - Secondary to MRSA bacteremia on admission started on pressors weaned, then off pressors and transfer to progressive unit on 05/20/2020. - shock resolved -cont above abx as tolerated  Acute systolic heart failure question septic cardiomyopathy - Initial 2D echo on admission showed an EF of 60% - repeat a 2D echo on 05/14/2020 showed a reduced EF of 20%.  - He was started on IV Lasix with good diuresis, once he was close to euvolemia it was stopped. -metoprolol, entresto - follow I&O, daily wts -cont metoprolol as tolerated  Normocytic anemia: - Hgbstable. No signs of bleed. Follow  Hypokalemia: - resolved, follow K+  Substance abuse disorder: - Patient admited to opiates and cocaine abuse. - needs outpt counseling, rehab  Bilateral wrist abscess (MSSA): - Orthopedic surgery was consulted and he was I&D on 05/03/2020 cultures grew MSSA. - Suture removal by orthopedic surgery.  Malnutrition of moderate degree - Ensure TID  DVT prophylaxis: Lovenox subq Code Status: Full Family Communication: Pt in room, family not at bedside  Status is: Inpatient  Remains inpatient appropriate because:Unsafe d/c plan and IV treatments appropriate due to intensity of illness or inability to take PO   Dispo: The patient is from: Home              Anticipated d/c is to: Home  Anticipated d/c date is: > 3 days              Patient currently is not medically stable to d/c.       Consultants:   ID  CTS  Cardiology  Pulm/CC  Orthopedics  Procedures:     Antimicrobials: Anti-infectives (From admission, onward)   Start     Dose/Rate Route Frequency Ordered Stop   05/25/20 1000   doxycycline (VIBRA-TABS) tablet 100 mg     Discontinue     100 mg Oral Every 12 hours 05/24/20 1135     05/24/20 1145  doxycycline (VIBRA-TABS) tablet 100 mg  Status:  Discontinued        100 mg Oral Every 12 hours 05/24/20 1048 05/24/20 1135   05/18/20 2000  DAPTOmycin (CUBICIN) 500 mg in sodium chloride 0.9 % IVPB     Discontinue     500 mg 220 mL/hr over 30 Minutes Intravenous Daily 05/18/20 1131     05/18/20 1400  ceftaroline (TEFLARO) 600 mg in sodium chloride 0.9 % 250 mL IVPB        600 mg 250 mL/hr over 60 Minutes Intravenous Every 8 hours 05/18/20 1131 05/24/20 2248   05/13/20 1730  meropenem (MERREM) 1 g in sodium chloride 0.9 % 100 mL IVPB  Status:  Discontinued        1 g 200 mL/hr over 30 Minutes Intravenous Every 8 hours 05/13/20 1720 05/14/20 1154   05/12/20 1600  vancomycin (VANCOREADY) IVPB 750 mg/150 mL  Status:  Discontinued        750 mg 150 mL/hr over 60 Minutes Intravenous Every 8 hours 05/12/20 1509 05/18/20 1131   05/05/20 1600  ceFAZolin (ANCEF) IVPB 2g/100 mL premix  Status:  Discontinued        2 g 200 mL/hr over 30 Minutes Intravenous Every 8 hours 05/05/20 1452 05/12/20 1509   05/04/20 1200  vancomycin (VANCOCIN) IVPB 1000 mg/200 mL premix  Status:  Discontinued        1,000 mg 200 mL/hr over 60 Minutes Intravenous Every 12 hours 05/04/20 1045 05/04/20 1055   05/04/20 1200  vancomycin (VANCOREADY) IVPB 750 mg/150 mL  Status:  Discontinued        750 mg 150 mL/hr over 60 Minutes Intravenous Every 8 hours 05/04/20 1055 05/05/20 1452   05/03/20 0945  vancomycin (VANCOCIN) powder  Status:  Discontinued          As needed 05/03/20 0945 05/03/20 1057   05/03/20 0920  ceFAZolin (ANCEF) 2-4 GM/100ML-% IVPB       Note to Pharmacy: Viviano Simas   : cabinet override      05/03/20 0920 05/03/20 2129   05/02/20 1500  vancomycin (VANCOREADY) IVPB 750 mg/150 mL  Status:  Discontinued        750 mg 150 mL/hr over 60 Minutes Intravenous Every 12 hours 05/02/20 1425  05/04/20 1045   05/01/20 1330  vancomycin variable dose per unstable renal function (pharmacist dosing)  Status:  Discontinued         Does not apply See admin instructions 05/01/20 1330 05/03/20 0810   04/29/20 1000  vancomycin (VANCOREADY) IVPB 750 mg/150 mL  Status:  Discontinued        750 mg 150 mL/hr over 60 Minutes Intravenous Every 8 hours 04/29/20 0834 05/01/20 1329   04/29/20 0400  metroNIDAZOLE (FLAGYL) IVPB 500 mg  Status:  Discontinued        500 mg 100 mL/hr over 60 Minutes Intravenous  Every 8 hours 04/28/20 2122 04/29/20 0819   04/29/20 0400  ceFEPIme (MAXIPIME) 2 g in sodium chloride 0.9 % 100 mL IVPB  Status:  Discontinued        2 g 200 mL/hr over 30 Minutes Intravenous Every 8 hours 04/28/20 2138 04/29/20 0819   04/29/20 0100  vancomycin (VANCOCIN) IVPB 1000 mg/200 mL premix  Status:  Discontinued        1,000 mg 200 mL/hr over 60 Minutes Intravenous Every 8 hours 04/28/20 2146 04/29/20 0833   04/28/20 1715  ceFEPIme (MAXIPIME) 2 g in sodium chloride 0.9 % 100 mL IVPB        2 g 200 mL/hr over 30 Minutes Intravenous  Once 04/28/20 1708 04/28/20 1903   04/28/20 1715  metroNIDAZOLE (FLAGYL) IVPB 500 mg        500 mg 100 mL/hr over 60 Minutes Intravenous  Once 04/28/20 1708 04/28/20 2111   04/28/20 1715  vancomycin (VANCOCIN) IVPB 1000 mg/200 mL premix        1,000 mg 200 mL/hr over 60 Minutes Intravenous  Once 04/28/20 1708 04/28/20 1947      Subjective: No complaints this AM  Objective: Vitals:   06/02/20 0646 06/02/20 0742 06/02/20 1108 06/02/20 1256  BP: 95/67 92/62 (!) 97/53   Pulse: 95 (!) 113 (!) 105   Resp: 19 19 18    Temp: 98.1 F (36.7 C) 98.3 F (36.8 C) 97.6 F (36.4 C) 97.6 F (36.4 C)  TempSrc: Oral Oral Oral Oral  SpO2: 100% 98% 99%   Weight: 54.7 kg     Height:        Intake/Output Summary (Last 24 hours) at 06/02/2020 1603 Last data filed at 06/02/2020 1500 Gross per 24 hour  Intake 789.12 ml  Output 400 ml  Net 389.12 ml   Filed  Weights   05/31/20 0556 06/01/20 0543 06/02/20 0646  Weight: 53.2 kg 54.4 kg 54.7 kg    Examination: General exam: Awake, laying in bed, in nad Respiratory system: Normal respiratory effort, no wheezing  Data Reviewed: I have personally reviewed following labs and imaging studies  CBC: Recent Labs  Lab 05/28/20 0632 05/29/20 0931 05/30/20 0456 05/31/20 1113 06/01/20 0500  WBC 11.6* 11.4* 12.6* 11.6* 12.9*  NEUTROABS 6.7 6.3 6.6 6.4 8.3*  HGB 11.1* 10.6* 10.5* 10.1* 10.3*  HCT 35.0* 33.5* 33.4* 33.4* 33.6*  MCV 87.1 87.5 89.3 89.3 88.9  PLT 356 343 334 349 334   Basic Metabolic Panel: Recent Labs  Lab 05/27/20 0716 05/27/20 0716 05/28/20 0632 05/29/20 0931 05/30/20 0456 05/31/20 1100 06/01/20 0500  NA 137   < > 135 138 138 135 137  K 4.8   < > 4.5 4.0 4.5 4.5 4.6  CL 102   < > 99 101 103 101 102  CO2 25   < > 27 24 26 25 27   GLUCOSE 108*   < > 99 119* 96 102* 86  BUN 26*   < > 26* 20 21* 25* 25*  CREATININE 0.76   < > 0.74 0.68 0.80 0.66 0.70  CALCIUM 9.6   < > 9.4 9.1 9.6 9.3 9.7  MG 2.0  --  2.2 1.8 1.9  --  2.1  PHOS 3.4   < > 4.4 3.2 4.0 3.4 4.5   < > = values in this interval not displayed.   GFR: Estimated Creatinine Clearance: 104.5 mL/min (by C-G formula based on SCr of 0.7 mg/dL). Liver Function Tests: Recent Labs  Lab 05/28/20  1478 05/29/20 0931 05/30/20 0456 05/31/20 1100 06/01/20 0500  ALBUMIN 2.9* 2.7* 2.8* 2.8* 2.8*   No results for input(s): LIPASE, AMYLASE in the last 168 hours. No results for input(s): AMMONIA in the last 168 hours. Coagulation Profile: No results for input(s): INR, PROTIME in the last 168 hours. Cardiac Enzymes: Recent Labs  Lab 06/01/20 1130  CKTOTAL 15*   BNP (last 3 results) No results for input(s): PROBNP in the last 8760 hours. HbA1C: No results for input(s): HGBA1C in the last 72 hours. CBG: Recent Labs  Lab 05/26/20 2039 05/27/20 0043 05/27/20 0518 05/27/20 0736 05/27/20 1133  GLUCAP 130* 122*  103* 100* 95   Lipid Profile: No results for input(s): CHOL, HDL, LDLCALC, TRIG, CHOLHDL, LDLDIRECT in the last 72 hours. Thyroid Function Tests: No results for input(s): TSH, T4TOTAL, FREET4, T3FREE, THYROIDAB in the last 72 hours. Anemia Panel: No results for input(s): VITAMINB12, FOLATE, FERRITIN, TIBC, IRON, RETICCTPCT in the last 72 hours. Sepsis Labs: No results for input(s): PROCALCITON, LATICACIDVEN in the last 168 hours.  No results found for this or any previous visit (from the past 240 hour(s)).   Radiology Studies: No results found.  Scheduled Meds: . docusate sodium  100 mg Oral BID  . doxycycline  100 mg Oral Q12H  . enoxaparin (LOVENOX) injection  40 mg Subcutaneous Q24H  . feeding supplement (ENSURE ENLIVE)  237 mL Oral TID BM  . mouth rinse  15 mL Mouth Rinse BID  . metoprolol tartrate  12.5 mg Oral BID  . multivitamin with minerals  1 tablet Oral Daily  . pantoprazole  40 mg Oral Daily  . polyethylene glycol  17 g Oral Daily  . sacubitril-valsartan  1 tablet Oral BID  . sodium chloride flush  10-40 mL Intracatheter Q12H  . sodium chloride flush  3 mL Intravenous Q12H   Continuous Infusions: . sodium chloride Stopped (05/30/20 2243)  . DAPTOmycin (CUBICIN)  IV 500 mg (06/02/20 0010)     LOS: 35 days   Rickey Barbara, MD [facility] Pager On Amion  If 7PM-7AM, please contact night-coverage 06/02/2020, 4:03 PM

## 2020-06-02 NOTE — Plan of Care (Signed)
  Problem: Health Behavior/Discharge Planning: Goal: Ability to manage health-related needs will improve Outcome: Progressing   

## 2020-06-03 NOTE — Plan of Care (Signed)
?  Problem: Coping: ?Goal: Level of anxiety will decrease ?Outcome: Progressing ?  ?Problem: Safety: ?Goal: Ability to remain free from injury will improve ?Outcome: Progressing ?  ?

## 2020-06-03 NOTE — Social Work (Addendum)
CSW spoke with patient regarding discharge plan. Patient expressed he has previously been to [facility] and would like to return there for inpatient rehab at discharge. Patient applied for Medicaid last week and it is currently pending. [facility] was funded by the state the last time he was there. Patient provided permission for CSW to contact [facility] to inquire about admitting.  CSW contacted [facility] and was told to have patient call June on Monday to possibly complete an assessment and see about being admitted. CSW provided [place] with the update and advised him to reach out with any assistance needed. TOC team will continue to follow.  Manfred Arch, LCSWA Clinical Social Work

## 2020-06-03 NOTE — Progress Notes (Signed)
Physical Therapy Progress Note  Pt supine in bed on entry stating "I'm sleeping." but then sits up in bed and reports he would like to go for a walk. Pt expresses frustration with HR problems limiting his ability to move, he is especially worried about being able to climb stairs to be able to go back to work. PT states understanding but need to take care of himself to allow heart to heal. Pt HR in 110s at rest, increased to high 120s with ambulation. Discussed stair training and need to stop if HR reached 140bpm. Pt reports understanding. Able to perform stair training with slow, steady gait and when HR reached 138bpm stopped and was able to recover back to 120s within 90 seconds. Cued pt to be mindful of what it feels like when his HR gets too high so he can work on self regulation. D/c plans remain appropriate. PT will continue to follow acutely.    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 s/p angiovac by CVTS 7/20. Intubated 7/23-7/28. PMHx: IVDU, asthma  Subjective Data  Patient Stated Goal return to independence/drug rehab  Precautions  Precautions Fall  Precaution Comments monitor HR, BP  Restrictions  Weight Bearing Restrictions No  Pain Assessment  Pain Assessment No/denies pain  Cognition  Arousal/Alertness Awake/alert  Behavior During Therapy WFL for tasks assessed/performed  Overall Cognitive Status Impaired/Different from baseline  Area of Impairment Safety/judgement;Problem solving  Orientation Level Disoriented to;Time  Current Attention Level Sustained  Safety/Judgement Decreased awareness of safety;Decreased awareness of deficits  Awareness Emergent  Problem Solving Requires verbal cues  General Comments pt continues to have poor understanding of his condition and the limitations it  presents to his activity level given HR increases   Bed Mobility  Overal bed mobility Modified Independent  General bed mobility comments Pt. seated in recliner upon entry  Transfers  Overall transfer level Needs assistance  Equipment used None  Transfers Sit to/from Stand  Sit to Stand Min guard  General transfer comment No difficult with standing, slight dizziness with standing that quickly dissipates   Ambulation/Gait  Ambulation/Gait assistance Supervision  Gait Distance (Feet) 750 Feet  Assistive device None  Gait Pattern/deviations Step-through pattern;Decreased stride length  General Gait Details able to ambulate as slow steady pace without AD, and maintaining HR 120-130 bpm,   Gait velocity decreased  Gait velocity interpretation 1.31 - 2.62 ft/sec, indicative of limited community ambulator  Stairs assistance Supervision  Stair Management One rail Right;Step to pattern;Forwards  Number of Stairs 20  General stair comments cued in slow, ascent of step to decrease HR increased with increased physical demand, required pt to rest at stop of steps to recover prior to descending back down steps and again to rest at bottom of steps  Balance  Overall balance assessment Needs assistance  Sitting-balance support No upper extremity supported;Feet supported  Sitting balance-Leahy Scale Good  Standing balance support No upper extremity supported  Standing balance-Leahy Scale Good  Standing balance comment steady in static and dynamic balance  General Comments  General comments (skin integrity, edema, etc.) HR 110's on entry, with ambulation increased to high 120's, pt eager to do stair training agreed if he would stop if HR reached 140 bpm. With ascent of 20 steps HR 138 bpm, required pt to rest  HR rose to 143bpm before he began to recover, Encouraged pt to be aware of how increased HR feels, so if it happens he can stop and let his HR recover. within 90 sec pt HR back to mid 120s. HR to mid  130s with descent of steps and similar recovery period. HR 128bpm at end of session, with supine in bed HR 108 bpm    PT - End of Session  Equipment Utilized During Treatment Gait belt  Activity Tolerance Patient tolerated treatment well  Patient left in bed;with call bell/phone within reach  Nurse Communication Mobility status   PT - Assessment/Plan  PT Plan Current plan remains appropriate  PT Visit Diagnosis Other abnormalities of gait and mobility (R26.89);Difficulty in walking, not elsewhere classified (R26.2)  PT Frequency (ACUTE ONLY) Min 3X/week  Recommendations for Other Services OT consult  Follow Up Recommendations Home health PT;Supervision/Assistance - 24 hour  PT equipment None recommended by PT  AM-PAC PT "6 Clicks" Mobility Outcome Measure (Version 2)  Help needed turning from your back to your side while in a flat bed without using bedrails? 4  Help needed moving from lying on your back to sitting on the side of a flat bed without using bedrails? 4  Help needed moving to and from a bed to a chair (including a wheelchair)? 4  Help needed standing up from a chair using your arms (e.g., wheelchair or bedside chair)? 4  Help needed to walk in hospital room? 4  Help needed climbing 3-5 steps with a railing?  3  6 Click Score 23  Consider Recommendation of Discharge To: Home with no services  PT Goal Progression  Progress towards PT goals Progressing toward goals  Acute Rehab PT Goals  PT Goal Formulation With patient  Time For Goal Achievement 06/16/20  Potential to Achieve Goals Good  PT Time Calculation  PT Start Time (ACUTE ONLY) 0853  PT Stop Time (ACUTE ONLY) 0911  PT Time Calculation (min) (ACUTE ONLY) 18 min  PT General Charges  $$ ACUTE PT VISIT 1 Visit  PT Treatments  $Gait Training 8-22 mins   Triva Hueber B. Beverely Risen PT, DPT Acute Rehabilitation Services Pager [phone] Office [phone]

## 2020-06-03 NOTE — Progress Notes (Signed)
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 s/p angiovac by CVTS 7/20. Intubated 7/23-7/28. PMHx: IVDU, asthma (Simultaneous filing. User may not have seen previous data.)   OT comments  Treatment with focus on self-care re-education with patient demonstrating increased safety and independence with BADLs this date. Patient continues to make steady progress toward POC. Patient also notes increased motivation to d/c to next level of care and reports completing BUE HEP and FMC activities throughout the day. Elevated HR with light BADLs limiting therapeutic exercise/activity this date. Patient would benefit from continued acute OT services to maximize safety and independence with self-care tasks and increase bilateral hand function.    Follow Up Recommendations  Home health OT;Supervision/Assistance - 24 hour    Equipment Recommendations  3 in 1 bedside commode    Recommendations for Other Services      Precautions / Restrictions Precautions Precautions: Fall (Simultaneous filing. User may not have seen previous data.) Precaution Comments: monitor HR, BP (Simultaneous filing. User may not have seen previous data.) Restrictions Weight Bearing Restrictions: No (Simultaneous filing. User may not have seen previous data.)       Mobility Bed Mobility Overal bed mobility: Modified Independent (Simultaneous filing. User may not have seen previous data.)             General bed mobility comments: Pt. seated in recliner upon entry  Transfers Overall transfer level: Needs assistance (Simultaneous filing. User may not have seen previous data.) Equipment used: None (Simultaneous filing. User may not have seen previous data.) Transfers: Sit  to/from Stand (Simultaneous filing. User may not have seen previous data.) Sit to Stand: Min guard (Simultaneous filing. User may not have seen previous data.)         General transfer comment: No difficult with standing, slight dizziness with standing that quickly dissipates  (Simultaneous filing. User may not have seen previous data.)    Balance Overall balance assessment: Needs assistance Sitting-balance support: No upper extremity supported;Feet supported Sitting balance-Leahy Scale: Good (Simultaneous filing. User may not have seen previous data.)     Standing balance support: No upper extremity supported Standing balance-Leahy Scale: Good (Simultaneous filing. User may not have seen previous data.) Standing balance comment: Static stand and lower level balance task steadily (Simultaneous filing. User may not have seen previous data.)                           ADL either performed or assessed with clinical judgement   ADL       Grooming: Supervision/safety;Standing Grooming Details (indicate cue type and reason): Hand washing, oral hygiene, and hair washing standing at sink level with close supervision A for safety.                              Functional mobility during ADLs: Supervision/safety General ADL Comments: Supervision A for safety with short-distance ambulation in room.      Vision       Perception     Praxis      Cognition Arousal/Alertness: Awake/alert (Simultaneous filing. User may not have seen previous data.) Behavior During Therapy: [facility] for tasks assessed/performed (Simultaneous filing. User may not have  seen previous data.) Overall Cognitive Status: Impaired/Different from baseline (Simultaneous filing. User may not have seen previous data.) Area of Impairment: Safety/judgement;Problem solving (Simultaneous filing. User may not have seen previous data.)                 Orientation Level: Disoriented to;Time Current  Attention Level: Sustained (Simultaneous filing. User may not have seen previous data.)     Safety/Judgement: Decreased awareness of safety;Decreased awareness of deficits Awareness: Emergent (Simultaneous filing. User may not have seen previous data.) Problem Solving: Requires verbal cues General Comments: pt continues to have poor understanding of his condition and the limitations it presents to his activity level given HR increases  (Simultaneous filing. User may not have seen previous data.)        Exercises Exercises: Other exercises   Shoulder Instructions       General Comments HR 120's at rest. Max HR 144 with light BADLs at sink level.     Pertinent Vitals/ Pain       Pain Assessment: No/denies pain (Simultaneous filing. User may not have seen previous data.)  Home Living                                          Prior Functioning/Environment              Frequency  Min 2X/week        Progress Toward Goals  OT Goals(current goals can now be found in the care plan section)  Progress towards OT goals: Progressing toward goals  Acute Rehab OT Goals Patient Stated Goal: return to independence/drug rehab (Simultaneous filing. User may not have seen previous data.) OT Goal Formulation: With patient Time For Goal Achievement: 06/16/20 Potential to Achieve Goals: Good ADL Goals Pt Will Perform Upper Body Bathing: with modified independence;sitting Pt Will Perform Lower Body Bathing: with modified independence;sit to/from stand Pt Will Perform Upper Body Dressing: with modified independence;sitting Pt Will Perform Lower Body Dressing: with modified independence;sit to/from stand Pt Will Transfer to Toilet: with modified independence;ambulating Pt Will Perform Toileting - Clothing Manipulation and hygiene: with modified independence;sit to/from stand Pt/caregiver will Perform Home Exercise Program: Increased strength;Both right and left upper  extremity;With theraputty;With theraband;With written HEP provided;Independently Additional ADL Goal #1: Pt will demonstrate anticipatory awareness during ADL activity in minimally disctracting environment  Plan Discharge plan remains appropriate    Co-evaluation                 AM-PAC OT "6 Clicks" Daily Activity     Outcome Measure   Help from another person eating meals?: None Help from another person taking care of personal grooming?: A Little Help from another person toileting, which includes using toliet, bedpan, or urinal?: A Little Help from another person bathing (including washing, rinsing, drying)?: A Lot Help from another person to put on and taking off regular upper body clothing?: A Little Help from another person to put on and taking off regular lower body clothing?: A Little 6 Click Score: 18    End of Session    OT Visit Diagnosis: Unsteadiness on feet (R26.81);Muscle weakness (generalized) (M62.81);History of falling (Z91.81);Other symptoms and signs involving cognitive function;Pain   Activity Tolerance Patient tolerated treatment well   Patient Left in chair;with call bell/phone within reach   Nurse Communication          Time: 9735-3299 OT Time  Calculation (min): 14 min  Charges: OT General Charges $OT Visit: 1 Visit OT Treatments $Self Care/Home Management : 8-22 mins  Abegail Kloeppel H. OTR/L Supplemental OT, Department of rehab services [phone]  Max Romano R H. 06/03/2020, 10:45 AM

## 2020-06-03 NOTE — Progress Notes (Signed)
PROGRESS NOTE    Adel Burch  TGG:[id number] DOB: [birth year] DOA: 04/28/2020 PCP: Patient, [name]    Brief Narrative:  29 y.o.malepast medical history of IVDA found on side of the road brought into the ED was found to have multiple septic emboli and cavitary lung lesions cultures growing MRSA bacteremia. 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 2D echo showed an EF of 60% with a PFO and a large vegetation in the tricuspid valve. He underwent AngioJet removal of part of the vegetation,repeat a 2D echo showed an EF of 20%. 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 - s/p debulking with angio vac on 05/10/2020 due to large vegetation. - Rpt 2D echo was on 05/19/2020 that showed an EF of 30%, with persistent tricuspid vegetation measuring 1.8 x 0.8 cm-to the posterior leaflet with severe tricuspid regurgitation. - CTS has seen, will f/u outpt for consideration of TV replacement after he has completed drug-rehab - ID has transitioned him to daptomycin monotherapy - rpt Bld Cx (8/2) showing bacillus -Per ID, plan to reach out to ID closer to  8/18 to determine long-term plan for abx regimen. Remains stable at this time  Acute respiratory failure with hypoxia - required intubation; extubated on 05/18/2020 - Resolved  Septic shock requiring pressors: - Secondary to MRSA bacteremia on admission started on pressors weaned, then off pressors and transfer to progressive unit on 05/20/2020. - shock resolved -cont above abx as tolerated  Acute systolic heart failure question septic cardiomyopathy - Initial 2D echo on admission showed an EF of 60% - repeat a 2D echo on 05/14/2020 showed a reduced EF of 20%.  - He was started on IV Lasix with good diuresis, once he was close to euvolemia it was stopped. -metoprolol, entresto - follow I&O, daily wts -cont metoprolol as tolerated  Normocytic anemia: - Hgbstable. No signs of bleed. Follow  Hypokalemia: - resolved, follow K+  Substance abuse disorder: - Patient admited to opiates and cocaine abuse. - needs outpt counseling, rehab  Bilateral wrist abscess (MSSA): - Orthopedic surgery was consulted and he was I&D on 05/03/2020 cultures grew MSSA. - Suture removal by orthopedic surgery.  Malnutrition of moderate degree - Ensure TID  DVT prophylaxis: Lovenox subq Code Status: Full Family Communication: Pt in room, family not at bedside  Status is: Inpatient  Remains inpatient appropriate because:Unsafe d/c plan and IV treatments appropriate due to intensity of illness or inability to take PO   Dispo: The patient is from: Home              Anticipated d/c is to: Home  Anticipated d/c date is: > 3 days              Patient currently is not medically stable to d/c.  Consultants:   ID  CTS  Cardiology  Pulm/CC  Orthopedics  Procedures:     Antimicrobials: Anti-infectives (From admission, onward)   Start     Dose/Rate Route Frequency Ordered Stop   05/25/20 1000   doxycycline (VIBRA-TABS) tablet 100 mg     Discontinue     100 mg Oral Every 12 hours 05/24/20 1135     05/24/20 1145  doxycycline (VIBRA-TABS) tablet 100 mg  Status:  Discontinued        100 mg Oral Every 12 hours 05/24/20 1048 05/24/20 1135   05/18/20 2000  DAPTOmycin (CUBICIN) 500 mg in sodium chloride 0.9 % IVPB     Discontinue     500 mg 220 mL/hr over 30 Minutes Intravenous Daily 05/18/20 1131     05/18/20 1400  ceftaroline (TEFLARO) 600 mg in sodium chloride 0.9 % 250 mL IVPB        600 mg 250 mL/hr over 60 Minutes Intravenous Every 8 hours 05/18/20 1131 05/24/20 2248   05/13/20 1730  meropenem (MERREM) 1 g in sodium chloride 0.9 % 100 mL IVPB  Status:  Discontinued        1 g 200 mL/hr over 30 Minutes Intravenous Every 8 hours 05/13/20 1720 05/14/20 1154   05/12/20 1600  vancomycin (VANCOREADY) IVPB 750 mg/150 mL  Status:  Discontinued        750 mg 150 mL/hr over 60 Minutes Intravenous Every 8 hours 05/12/20 1509 05/18/20 1131   05/05/20 1600  ceFAZolin (ANCEF) IVPB 2g/100 mL premix  Status:  Discontinued        2 g 200 mL/hr over 30 Minutes Intravenous Every 8 hours 05/05/20 1452 05/12/20 1509   05/04/20 1200  vancomycin (VANCOCIN) IVPB 1000 mg/200 mL premix  Status:  Discontinued        1,000 mg 200 mL/hr over 60 Minutes Intravenous Every 12 hours 05/04/20 1045 05/04/20 1055   05/04/20 1200  vancomycin (VANCOREADY) IVPB 750 mg/150 mL  Status:  Discontinued        750 mg 150 mL/hr over 60 Minutes Intravenous Every 8 hours 05/04/20 1055 05/05/20 1452   05/03/20 0945  vancomycin (VANCOCIN) powder  Status:  Discontinued          As needed 05/03/20 0945 05/03/20 1057   05/03/20 0920  ceFAZolin (ANCEF) 2-4 GM/100ML-% IVPB       Note to Pharmacy: Viviano Simas   : cabinet override      05/03/20 0920 05/03/20 2129   05/02/20 1500  vancomycin (VANCOREADY) IVPB 750 mg/150 mL  Status:  Discontinued        750 mg 150 mL/hr over 60 Minutes Intravenous Every 12 hours 05/02/20 1425  05/04/20 1045   05/01/20 1330  vancomycin variable dose per unstable renal function (pharmacist dosing)  Status:  Discontinued         Does not apply See admin instructions 05/01/20 1330 05/03/20 0810   04/29/20 1000  vancomycin (VANCOREADY) IVPB 750 mg/150 mL  Status:  Discontinued        750 mg 150 mL/hr over 60 Minutes Intravenous Every 8 hours 04/29/20 0834 05/01/20 1329   04/29/20 0400  metroNIDAZOLE (FLAGYL) IVPB 500 mg  Status:  Discontinued        500 mg 100 mL/hr over 60 Minutes Intravenous Every 8 hours 04/28/20 2122  04/29/20 0819   04/29/20 0400  ceFEPIme (MAXIPIME) 2 g in sodium chloride 0.9 % 100 mL IVPB  Status:  Discontinued        2 g 200 mL/hr over 30 Minutes Intravenous Every 8 hours 04/28/20 2138 04/29/20 0819   04/29/20 0100  vancomycin (VANCOCIN) IVPB 1000 mg/200 mL premix  Status:  Discontinued        1,000 mg 200 mL/hr over 60 Minutes Intravenous Every 8 hours 04/28/20 2146 04/29/20 0833   04/28/20 1715  ceFEPIme (MAXIPIME) 2 g in sodium chloride 0.9 % 100 mL IVPB        2 g 200 mL/hr over 30 Minutes Intravenous  Once 04/28/20 1708 04/28/20 1903   04/28/20 1715  metroNIDAZOLE (FLAGYL) IVPB 500 mg        500 mg 100 mL/hr over 60 Minutes Intravenous  Once 04/28/20 1708 04/28/20 2111   04/28/20 1715  vancomycin (VANCOCIN) IVPB 1000 mg/200 mL premix        1,000 mg 200 mL/hr over 60 Minutes Intravenous  Once 04/28/20 1708 04/28/20 1947      Subjective: Without complaints this AM  Objective: Vitals:   06/03/20 0647 06/03/20 0754 06/03/20 1307 06/03/20 1401  BP:  102/69 (!) 91/43 102/62  Pulse:  (!) 107 (!) 113 (!) 105  Resp:  16 18 18   Temp:  97.9 F (36.6 C) 97.9 F (36.6 C) 97.9 F (36.6 C)  TempSrc:  Oral  Oral  SpO2:  98% 99% 99%  Weight: 56.5 kg     Height:        Intake/Output Summary (Last 24 hours) at 06/03/2020 1518 Last data filed at 06/03/2020 1300 Gross per 24 hour  Intake 944 ml  Output 1525 ml  Net -581 ml   Filed Weights   06/01/20  0543 06/02/20 0646 06/03/20 0647  Weight: 54.4 kg 54.7 kg 56.5 kg    Examination: General exam: Conversant, in no acute distress Respiratory system: normal chest rise, clear, no audible wheezing  Data Reviewed: I have personally reviewed following labs and imaging studies  CBC: Recent Labs  Lab 05/28/20 0632 05/29/20 0931 05/30/20 0456 05/31/20 1113 06/01/20 0500  WBC 11.6* 11.4* 12.6* 11.6* 12.9*  NEUTROABS 6.7 6.3 6.6 6.4 8.3*  HGB 11.1* 10.6* 10.5* 10.1* 10.3*  HCT 35.0* 33.5* 33.4* 33.4* 33.6*  MCV 87.1 87.5 89.3 89.3 88.9  PLT 356 343 334 349 334   Basic Metabolic Panel: Recent Labs  Lab 05/28/20 0632 05/29/20 0931 05/30/20 0456 05/31/20 1100 06/01/20 0500  NA 135 138 138 135 137  K 4.5 4.0 4.5 4.5 4.6  CL 99 101 103 101 102  CO2 27 24 26 25 27   GLUCOSE 99 119* 96 102* 86  BUN 26* 20 21* 25* 25*  CREATININE 0.74 0.68 0.80 0.66 0.70  CALCIUM 9.4 9.1 9.6 9.3 9.7  MG 2.2 1.8 1.9  --  2.1  PHOS 4.4 3.2 4.0 3.4 4.5   GFR: Estimated Creatinine Clearance: 107.9 mL/min (by C-G formula based on SCr of 0.7 mg/dL). Liver Function Tests: Recent Labs  Lab 05/28/20 [id number] 05/29/20 0931 05/30/20 0456 05/31/20 1100 06/01/20 0500  ALBUMIN 2.9* 2.7* 2.8* 2.8* 2.8*   No results for input(s): LIPASE, AMYLASE in the last 168 hours. No results for input(s): AMMONIA in the last 168 hours. Coagulation Profile: No results for input(s): INR, PROTIME in the last 168 hours. Cardiac Enzymes: Recent Labs  Lab 06/01/20 1130  CKTOTAL 15*   BNP (last 3 results) No  results for input(s): PROBNP in the last 8760 hours. HbA1C: No results for input(s): HGBA1C in the last 72 hours. CBG: No results for input(s): GLUCAP in the last 168 hours. Lipid Profile: No results for input(s): CHOL, HDL, LDLCALC, TRIG, CHOLHDL, LDLDIRECT in the last 72 hours. Thyroid Function Tests: No results for input(s): TSH, T4TOTAL, FREET4, T3FREE, THYROIDAB in the last 72 hours. Anemia Panel: No  results for input(s): VITAMINB12, FOLATE, FERRITIN, TIBC, IRON, RETICCTPCT in the last 72 hours. Sepsis Labs: No results for input(s): PROCALCITON, LATICACIDVEN in the last 168 hours.  No results found for this or any previous visit (from the past 240 hour(s)).   Radiology Studies: No results found.  Scheduled Meds: . docusate sodium  100 mg Oral BID  . doxycycline  100 mg Oral Q12H  . enoxaparin (LOVENOX) injection  40 mg Subcutaneous Q24H  . feeding supplement (ENSURE ENLIVE)  237 mL Oral TID BM  . mouth rinse  15 mL Mouth Rinse BID  . metoprolol tartrate  12.5 mg Oral BID  . multivitamin with minerals  1 tablet Oral Daily  . pantoprazole  40 mg Oral Daily  . polyethylene glycol  17 g Oral Daily  . sacubitril-valsartan  1 tablet Oral BID  . sodium chloride flush  10-40 mL Intracatheter Q12H  . sodium chloride flush  3 mL Intravenous Q12H   Continuous Infusions: . sodium chloride Stopped (05/30/20 2243)  . DAPTOmycin (CUBICIN)  IV 500 mg (06/02/20 2040)     LOS: 36 days   Rickey Barbara, MD [facility] Pager On Amion  If 7PM-7AM, please contact night-coverage 06/03/2020, 3:18 PM

## 2020-06-04 MED ORDER — METOPROLOL TARTRATE 5 MG/5ML IV SOLN
2.5000 mg | Freq: Four times a day (QID) | INTRAVENOUS | Status: DC | PRN
  Administered 2020-06-04 – 2020-06-05 (×2): 2.5 mg via INTRAVENOUS
  Filled 2020-06-04 (×4): qty 5

## 2020-06-04 MED ORDER — METOPROLOL TARTRATE 5 MG/5ML IV SOLN: 5.0000 mg | Freq: Four times a day (QID) | INTRAVENOUS | Status: DC | PRN

## 2020-06-04 MED ORDER — METOPROLOL TARTRATE 25 MG PO TABS: 25.0000 mg | ORAL_TABLET | Freq: Two times a day (BID) | ORAL | Status: DC

## 2020-06-04 MED ORDER — METOPROLOL TARTRATE 12.5 MG HALF TABLET
12.5000 mg | ORAL_TABLET | Freq: Two times a day (BID) | ORAL | Status: DC
  Administered 2020-06-04 (×2): 12.5 mg via ORAL
  Filled 2020-06-04 (×3): qty 1

## 2020-06-04 NOTE — Plan of Care (Signed)
  Problem: Safety: Goal: Ability to remain free from injury will improve Outcome: Progressing   
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
## 2020-06-04 NOTE — Progress Notes (Addendum)
   06/04/20 0910  Assess: MEWS Score  Temp 97.6 F (36.4 C)  BP (!) 103/59  ECG Heart Rate (!) 117  Resp 20  Level of Consciousness Alert  SpO2 100 %  Assess: MEWS Score  MEWS Temp 0  MEWS Systolic 0  MEWS Pulse 2  MEWS RR 0  MEWS LOC 0  MEWS Score 2  MEWS Score Color Yellow  Assess: if the MEWS score is Yellow or Red  Were vital signs taken at a resting state? Yes  Focused Assessment No change from prior assessment  Early Detection of Sepsis Score *See Row Information* Low  MEWS guidelines implemented *See Row Information* No, previously yellow, continue vital signs every 4 hours  Document  Patient Outcome Other (Comment) (PRN orders placed by MD)  Progress note created (see row info) Yes

## 2020-06-05 MED ORDER — METOPROLOL TARTRATE 25 MG PO TABS
25.0000 mg | ORAL_TABLET | Freq: Two times a day (BID) | ORAL | Status: DC
  Administered 2020-06-05 – 2020-06-06 (×3): 25 mg via ORAL
  Filled 2020-06-05 (×3): qty 1
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
## 2020-06-06 MED ORDER — METOPROLOL TARTRATE 25 MG PO TABS
37.5000 mg | ORAL_TABLET | Freq: Two times a day (BID) | ORAL | Status: DC
  Administered 2020-06-06 – 2020-06-07 (×2): 37.5 mg via ORAL
  Filled 2020-06-06 (×2): qty 1
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
## 2020-06-06 NOTE — Progress Notes (Signed)
   06/06/20 0746  Assess: MEWS Score  Temp 97.8 F (36.6 C)  BP 97/62  Pulse Rate (!) 113  ECG Heart Rate (!) 113  SpO2 100 %  O2 Device Room Air  Patient Activity (if Appropriate) In bed  Assess: MEWS Score  MEWS Temp 0  MEWS Systolic 1  MEWS Pulse 2  MEWS RR 0  MEWS LOC 0  MEWS Score 3  MEWS Score Color Yellow  Assess: if the MEWS score is Yellow or Red  Were vital signs taken at a resting state? Yes  Focused Assessment No change from prior assessment  MEWS guidelines implemented *See Row Information* No, previously yellow, continue vital signs every 4 hours  Treat  MEWS Interventions Administered scheduled meds/treatments  Pain Scale 0-10  Pain Score 0   - Patient MEWS score fired YELLOW due to low BP & elevated HR - Pt is being treated for sepsis and on beta blockers to control HR - Scheduled medication to be given -No further interventions at this time

## 2020-06-06 NOTE — Progress Notes (Signed)
   06/06/20 2202  Assess: MEWS Score  Temp 97.6 F (36.4 C)  BP (!) 96/54  Pulse Rate (!) 110  Resp 18  Level of Consciousness Alert  SpO2 100 %  O2 Device Room Air  Assess: MEWS Score  MEWS Temp 0  MEWS Systolic 1  MEWS Pulse 1  MEWS RR 0  MEWS LOC 0  MEWS Score 2  MEWS Score Color Yellow  Assess: if the MEWS score is Yellow or Red  Were vital signs taken at a resting state? Yes  Focused Assessment No change from prior assessment  Early Detection of Sepsis Score *See Row Information* Low  MEWS guidelines implemented *See Row Information* No, previously yellow, continue vital signs every 4 hours  Treat  MEWS Interventions Administered scheduled meds/treatments  Pain Scale 0-10  -denies compliants -has been mews yellow Will monitor

## 2020-06-07 MED ORDER — METOPROLOL TARTRATE 50 MG PO TABS: 50.0000 mg | ORAL_TABLET | Freq: Two times a day (BID) | ORAL | Status: DC

## 2020-06-07 MED ORDER — CARVEDILOL 6.25 MG PO TABS
6.2500 mg | ORAL_TABLET | Freq: Two times a day (BID) | ORAL | Status: DC
  Administered 2020-06-08 – 2020-06-17 (×18): 6.25 mg via ORAL
  Filled 2020-06-07 (×19): qty 1

## 2020-06-07 MED ORDER — IVABRADINE HCL 5 MG PO TABS
5.0000 mg | ORAL_TABLET | Freq: Two times a day (BID) | ORAL | Status: DC
  Administered 2020-06-07: 5 mg via ORAL
  Filled 2020-06-07 (×2): qty 1

## 2020-06-07 MED ORDER — CARVEDILOL 6.25 MG PO TABS: 6.2500 mg | ORAL_TABLET | Freq: Two times a day (BID) | ORAL | Status: DC

## 2020-06-07 MED ORDER — CARVEDILOL 12.5 MG PO TABS: 12.5000 mg | ORAL_TABLET | Freq: Two times a day (BID) | ORAL | Status: DC

## 2020-06-07 MED ORDER — IVABRADINE HCL 5 MG PO TABS
5.0000 mg | ORAL_TABLET | Freq: Two times a day (BID) | ORAL | Status: DC
  Administered 2020-06-08 – 2020-06-13 (×11): 5 mg via ORAL
  Filled 2020-06-07 (×11): qty 1

## 2020-06-07 MED ORDER — CARVEDILOL 6.25 MG PO TABS
6.2500 mg | ORAL_TABLET | Freq: Two times a day (BID) | ORAL | Status: DC
  Administered 2020-06-07: 6.25 mg via ORAL
  Filled 2020-06-07: qty 1

## 2020-06-07 MED ORDER — SPIRONOLACTONE 12.5 MG HALF TABLET
12.5000 mg | ORAL_TABLET | Freq: Every day | ORAL | Status: DC
  Administered 2020-06-07 – 2020-06-13 (×7): 12.5 mg via ORAL
  Filled 2020-06-07 (×7): qty 1

## 2020-06-07 NOTE — Progress Notes (Signed)
   06/07/20 0734  Assess: MEWS Score  Temp 97.9 F (36.6 C)  BP 97/68  SpO2 100 %  O2 Device Room Air  Patient Activity (if Appropriate) In bed  Assess: MEWS Score  MEWS Temp 0  MEWS Systolic 1  MEWS Pulse 1  MEWS RR 0  MEWS LOC 0  MEWS Score 2  MEWS Score Color Yellow  Assess: if the MEWS score is Yellow or Red  Were vital signs taken at a resting state? Yes  Focused Assessment No change from prior assessment  MEWS guidelines implemented *See Row Information* No, previously yellow, continue vital signs every 4 hours  Treat  MEWS Interventions Administered scheduled meds/treatments  Pain Scale 0-10  Pain Score 0   - MEWS score fired YELLOW due to lob BP and elevated HR - Pt is known to have soft BP and elevated HR due to sepsis and is being treated - No other symptoms noted; no further interventions at this time
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
## 2020-06-07 NOTE — Progress Notes (Signed)
[facility]  Date of Admission:  04/28/2020     Total days of antibiotics 40            ASSESSMENT:  Attilio feels better but still with some reservations on my part. Had recurrent, difficult to clear bacteremia following angiovac procedure. I worry he is not at the point where he would be medically cleared for inpatient drug rehab program and that if he goes or that we will not be able to follow up appropriately with him.   He knows he needs valve replacement surgery - wonders about the steps with that following drug rehab.   Would see if heart failure team has any recommendations about regimen and get him plugged into their clinic after discharge. Seems he was hypotensive on coreg so changed to lopressor --> increasing dose now but still tachy in 120s with minimal effort. Entresto at low dose.    PLAN: 1. Would recommend to continue daptomycin and doxycyline another week 2. Would see if AHF team can see him back for recs and follow up   Principal Problem:   Endocarditis Active Problems:   Septic embolism (HCC)   MSSA bacteremia   Hand abscess   Intravenous drug abuse (HCC)   Sepsis (HCC)   Thrombocytopenia (HCC)   Hypokalemia   Hyponatremia   Malnutrition of moderate degree   Threatening behavior   Elevated BUN   Pressure injury of skin   Chest pain   Acute respiratory failure (HCC)   Acute systolic heart failure (HCC)    docusate sodium  100 mg Oral BID   doxycycline  100 mg Oral Q12H   enoxaparin (LOVENOX) injection  40 mg Subcutaneous Q24H   feeding supplement (ENSURE ENLIVE)  237 mL Oral TID BM   mouth rinse  15 mL Mouth Rinse BID   metoprolol tartrate  37.5 mg Oral BID   multivitamin with minerals  1 tablet Oral Daily   pantoprazole  40 mg Oral Daily   polyethylene glycol  17 g Oral Daily   sacubitril-valsartan  1 tablet Oral BID   sodium chloride flush  10-40 mL Intracatheter Q12H    SUBJECTIVE:  Feeling improved  but still limited. He does not recall anything from the first few weeks of hospitalization, just moments (speaks of being in ICU on breathing tube and writing notes).  Still with anterior chest pain with deep inspiration and coughing. No worse but still present. Able to ambulate in the hall better than a few weeks ago. Worried about his heart rate being up so high still - feels as if he has "beating in his throat" sometimes, even at rest. Now able to get up some stairs with less effort.  No fevers or chills   Planning inpatient drug rehab program for 90-days after hospitalization. Also has to be in court on the 3rd to finalize some legal plans.    No Known Allergies   Review of Systems: Review of Systems  Constitutional: Negative for chills, fever and weight loss.  Respiratory: Negative for cough, shortness of breath and wheezing.   Cardiovascular: Negative for chest pain and leg swelling.  Gastrointestinal: Negative for abdominal pain, constipation, diarrhea, nausea and vomiting.  Skin: Negative for rash.      OBJECTIVE: Vitals:   06/07/20 0630 06/07/20 0734 06/07/20 0902 06/07/20 1107  BP: (!) 82/57 97/68 114/73   Pulse: (!) 104     Resp: 18     Temp: 97.6 F (36.4  C) 97.9 F (36.6 C)  98.1 F (36.7 C)  TempSrc: Oral Oral  Oral  SpO2: 100% 100%    Weight:      Height:       Body mass index is 20.05 kg/m.  Physical Exam Constitutional:      General: He is not in acute distress.    Appearance: He is well-developed.  Cardiovascular:     Rate and Rhythm: Normal rate and regular rhythm.     Heart sounds: Normal heart sounds.  Pulmonary:     Effort: Pulmonary effort is normal.     Breath sounds: Normal breath sounds.  Skin:    General: Skin is warm and dry.  Neurological:     Mental Status: He is alert and oriented to person, place, and time.  Psychiatric:        Behavior: Behavior normal.        Thought Content: Thought content normal.        Judgment: Judgment  normal.     Lab Results Lab Results  Component Value Date   WBC 12.9 (H) 06/01/2020   HGB 10.3 (L) 06/01/2020   HCT 33.6 (L) 06/01/2020   MCV 88.9 06/01/2020   PLT 334 06/01/2020    Lab Results  Component Value Date   CREATININE 0.70 06/01/2020   BUN 25 (H) 06/01/2020   NA 137 06/01/2020   K 4.6 06/01/2020   CL 102 06/01/2020   CO2 27 06/01/2020    Lab Results  Component Value Date   ALT 114 (H) 05/25/2020   AST 84 (H) 05/25/2020   ALKPHOS 95 05/25/2020   BILITOT 0.4 05/25/2020     Microbiology: No results found for this or any previous visit (from the past 240 hour(s)).    Rexene Alberts, MSN, NP-C [facility]  [email or web address] Pager: [phone] Office: [phone] [facility] Main Line: [phone]

## 2020-06-07 NOTE — Progress Notes (Signed)
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 s/p angiovac by CVTS 7/20. Intubated 7/23-7/28. PMHx: IVDU, asthma   OT comments  Pt progressing towards OT goals, presents supine in bed pleasant and willing to participate in therapy session. Focus of session on fine motor/coordination/strengthening HEP. Educated pt in additional exercises/activities with pt return demonstrating understanding throughout. Corresponding handout issued this session. HR up to 130 bpm (briefly) with seated activity. Anticipate pt is progressing to point where he may not require follow up OT services after discharge - will continue to monitor/follow.   Follow Up Recommendations  Home health OT;Supervision/Assistance - 24 hour (likely can progress to no OT followup with 24hr )    Equipment Recommendations  3 in 1 bedside commode          Precautions / Restrictions Precautions Precautions: Fall Precaution Comments: monitor HR, BP Restrictions Weight Bearing Restrictions: No       Mobility Bed Mobility Overal bed mobility: Modified Independent             General bed mobility comments: transition to long sitting without difficulty   Transfers                      Balance Overall balance assessment: Needs assistance Sitting-balance support: No upper extremity supported;Feet supported Sitting balance-Leahy Scale: Good                                     ADL either performed or assessed with clinical judgement   ADL Overall ADL's : Needs assistance/impaired                                       General ADL Comments: reports having completing his BADL this morning, focus of session on fine motor HEP/activity  completion      Cognition Arousal/Alertness: Awake/alert Behavior During Therapy: WFL for tasks assessed/performed Overall Cognitive Status: Impaired/Different from baseline Area of Impairment: Safety/judgement;Problem solving                   Current Attention Level: Selective     Safety/Judgement: Decreased awareness of safety;Decreased awareness of deficits Awareness: Emergent Problem Solving: Requires verbal cues General Comments: pt reports has been "sneaking" to shower, verbalizes knowing he needs a doctor order to do this - educated/encouraged pt to speak with MD for verbal clearance         Exercises Exercises: Hand activities;Other exercises Hand Exercises Digit Composite Flexion: AROM;10 reps Composite Extension: AROM;Both;10 reps Digit Composite Abduction: AROM;Both;10 reps Digit Composite Adduction: AROM;Both;10 reps Digit Lifts: AROM;Both;10 reps Opposition: AROM;Both;10 reps Hand Activities Cards - Hand Shuffle: Both;5 reps;Seated Cards - Shuffle Limitations: increased time/effort  Other Exercises Other Exercises: picking up x5-8 small items, place in small hole in container using in-hand translation/manipulation, completing with both R hand and L hand ; educated in completing task with coins as well - pt reports can ask mom to bring some    Shoulder Instructions       General Comments      Pertinent Vitals/ Pain  Pain Assessment: Faces Faces Pain Scale: No hurt Pain Intervention(s): Monitored during session  Home Living                                          Prior Functioning/Environment              Frequency  Min 2X/week        Progress Toward Goals  OT Goals(current goals can now be found in the care plan section)  Progress towards OT goals: Progressing toward goals  Acute Rehab OT Goals Patient Stated Goal: return to independence/drug rehab OT Goal Formulation: With patient Time For Goal  Achievement: 06/16/20 Potential to Achieve Goals: Good ADL Goals Pt Will Perform Upper Body Bathing: with modified independence;sitting Pt Will Perform Lower Body Bathing: with modified independence;sit to/from stand Pt Will Perform Upper Body Dressing: with modified independence;sitting Pt Will Perform Lower Body Dressing: with modified independence;sit to/from stand Pt Will Transfer to Toilet: with modified independence;ambulating Pt Will Perform Toileting - Clothing Manipulation and hygiene: with modified independence;sit to/from stand Pt/caregiver will Perform Home Exercise Program: Increased strength;Both right and left upper extremity;With theraputty;With theraband;With written HEP provided;Independently Additional ADL Goal #1: Pt will demonstrate anticipatory awareness during ADL activity in minimally disctracting environment  Plan Discharge plan remains appropriate (could like progress to no OT follow up )    Co-evaluation                 AM-PAC OT "6 Clicks" Daily Activity     Outcome Measure   Help from another person eating meals?: None Help from another person taking care of personal grooming?: A Little Help from another person toileting, which includes using toliet, bedpan, or urinal?: A Little Help from another person bathing (including washing, rinsing, drying)?: A Little Help from another person to put on and taking off regular upper body clothing?: A Little Help from another person to put on and taking off regular lower body clothing?: A Little 6 Click Score: 19    End of Session    OT Visit Diagnosis: Unsteadiness on feet (R26.81);Muscle weakness (generalized) (M62.81);History of falling (Z91.81);Other symptoms and signs involving cognitive function   Activity Tolerance Patient tolerated treatment well   Patient Left in bed;with call bell/phone within reach   Nurse Communication Mobility status        Time: 3500-9381 OT Time Calculation (min): 20  min  Charges: OT General Charges $OT Visit: 1 Visit OT Treatments $Therapeutic Activity: 8-22 mins  Marcy Siren, OT Acute Rehabilitation Services Pager [phone] Office [phone]    Clifford Clark 06/07/2020, 8:59 AM

## 2020-06-07 NOTE — Progress Notes (Signed)
   06/07/20 2051  Assess: MEWS Score  Temp 97.7 F (36.5 C)  BP (!) 97/54  ECG Heart Rate (!) 110  Resp 16  Level of Consciousness Alert  SpO2 98 %  O2 Device Room Air  Patient Activity (if Appropriate) In chair  Assess: MEWS Score  MEWS Temp 0  MEWS Systolic 1  MEWS Pulse 1  MEWS RR 0  MEWS LOC 0  MEWS Score 2  MEWS Score Color Yellow  Assess: if the MEWS score is Yellow or Red  Were vital signs taken at a resting state? Yes  Focused Assessment No change from prior assessment  Early Detection of Sepsis Score *See Row Information* Low  MEWS guidelines implemented *See Row Information* No, previously yellow, continue vital signs every 4 hours  Treat  Pain Scale 0-10  Pain Score 6  Pain Type Acute pain  Pain Location Head  Pain Orientation Anterior  Pain Descriptors / Indicators Aching  Pain Frequency Rarely  Pain Onset Sudden  Patients Stated Pain Goal 0  Pain Intervention(s) Medication (See eMAR);Relaxation;Rest  Multiple Pain Sites No  Escalate  MEWS: Escalate Yellow: discuss with charge nurse/RN and consider discussing with provider and RRT  Notify: Charge Nurse/RN  Name of Charge Nurse/RN Notified Blong Busk  Date Charge Nurse/RN Notified 06/07/20  Time Charge Nurse/RN Notified 2051  Document  Patient Outcome Other (Comment)  Progress note created (see row info) Yes

## 2020-06-07 NOTE — Progress Notes (Signed)
  Mobility Specialist Criteria Algorithm Info.  Mobility Team: [facility] elevated:Self regulated Activity: Ambulated in hall Range of motion: Active;All extremities Level of assistance: Independent Assistive device: None Minutes sitting in chair:  Minutes stood: 5 minutes Minutes ambulated: 5 minutes Distance ambulated (ft): 500 ft Mobility response: Tolerated well Bed Position: Semi-fowlers    06/06/2020 10:15 AM

## 2020-06-07 NOTE — Progress Notes (Signed)
PT Cancellation Note  Patient Details Name: Clifford Clark MRN: [id number] DOB: 1991-10-15   Cancelled Treatment:    Reason Eval/Treat Not Completed: Medical issues which prohibited therapy.  HR at rest too high in the high 120's  for pt to do any activity.  Will hold today. 06/07/2020  Jacinto Halim., PT Acute Rehabilitation Services [phone]  (pager) [phone]  (office)   Eliseo Gum Ronson Hagins 06/07/2020, 5:11 PM

## 2020-06-07 NOTE — Progress Notes (Addendum)
Advanced Heart Failure Rounding Note  PCP-Cardiologist: No primary care provider on file.    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 ER w/u showed multiple septic emboli to lungs with cavitary lesions. 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  Also found to have emboli to kidneys and hands. TEE with EF 60-65% small PFO. large TV vegetation with severe TR. No vegetations on Aov/MV despite left-sided emboli.  Underwent AngioJet removal of part of vegetation. Then developed MRSA sepsis and respiratory failure. Intubated and started on pressors. F/u echo LVEF 20-25%. RV normal.  hstrop 192 -> 190.     Subjective:   Complaining of palpitations and his heart racing. Says he feels short of breath when he is talking.    Limited Echo 7/29  1. Left ventricular ejection fraction, by estimation, is 30 to 35%. The left ventricle has moderately decreased function. The left ventricle demonstrates global hypokinesis. There is mild left ventricular hypertrophy. 2. Right ventricular systolic function is normal. The right ventricular size is normal. There is normal pulmonary artery systolic pressure. The estimated right ventricular systolic pressure is 30.5 mmHg. 3. The mitral valve is normal in structure. Trivial mitral valve regurgitation. 4. Tricuspid valve vegetations are seen, measuring up to 1.8 x 0.8 cm attached to posterior leaflet and up to 1.8 x 1.0 cm attached to anterior leaflet. The tricuspid valve is abnormal. Tricuspid valve regurgitation is severe. 5. The aortic valve is tricuspid. Aortic valve regurgitation is not visualized. No aortic stenosis is present. 6. Small pericardial effusion 7. The inferior vena cava is normal in size with <50% respiratory variability, suggesting right atrial pressure of 8 mmHg.  Objective:   Weight Range: 58.1 kg Body mass index is 20.05 kg/m.   Vital Signs:   Temp:  [97.6 F (36.4  C)-98.1 F (36.7 C)] 98.1 F (36.7 C) (08/17 1107) Pulse Rate:  [102-110] 104 (08/17 0630) Resp:  [18] 18 (08/17 0630) BP: (82-114)/(54-73) 97/61 (08/17 1426) SpO2:  [100 %] 100 % (08/17 0734) Weight:  [58.1 kg] 58.1 kg (08/17 0123) Last BM Date: 06/06/20  Weight change: Filed Weights   06/05/20 0500 06/06/20 0647 06/07/20 0123  Weight: 57 kg 57.6 kg 58.1 kg    Intake/Output:   Intake/Output Summary (Last 24 hours) at 06/07/2020 1449 Last data filed at 06/07/2020 1328 Gross per 24 hour  Intake 660 ml  Output 600 ml  Net 60 ml      Physical Exam    General:  Sitting in the chair. No resp difficulty HEENT: normal Neck: supple. no JVD. Carotids 2+ bilat; no bruits. No lymphadenopathy or thryomegaly appreciated. Cor: PMI nondisplaced. Regular rate & rhythm. No rubs, gallops. 2/6 TR murmurs. Lungs: clear Abdomen: soft, nontender, nondistended. No hepatosplenomegaly. No bruits or masses. Good bowel sounds. Extremities: no cyanosis, clubbing, rash, edema Neuro: alert & orientedx3, cranial nerves grossly intact. moves all 4 extremities w/o difficulty. Affect pleasant   Telemetry   Sinus Tach 120-130s    Labs    CBC No results for input(s): WBC, NEUTROABS, HGB, HCT, MCV, PLT in the last 72 hours. Basic Metabolic Panel No results for input(s): NA, K, CL, CO2, GLUCOSE, BUN, CREATININE, CALCIUM, MG, PHOS in the last 72 hours. Liver Function Tests No results for input(s): AST, ALT, ALKPHOS, BILITOT, PROT, ALBUMIN in the last 72 hours. No results for input(s): LIPASE, AMYLASE in the last 72 hours. Cardiac  Enzymes No results for input(s): CKTOTAL, CKMB, CKMBINDEX, TROPONINI in the last 72 hours.  BNP: BNP (last 3 results) No results for input(s): BNP in the last 8760 hours.  ProBNP (last 3 results) No results for input(s): PROBNP in the last 8760 hours.   D-Dimer No results for input(s): DDIMER in the last 72 hours. Hemoglobin A1C No results for input(s): HGBA1C in  the last 72 hours. Fasting Lipid Panel No results for input(s): CHOL, HDL, LDLCALC, TRIG, CHOLHDL, LDLDIRECT in the last 72 hours. Thyroid Function Tests No results for input(s): TSH, T4TOTAL, T3FREE, THYROIDAB in the last 72 hours.  Invalid input(s): FREET3  Other results:   Imaging    No results found.   Medications:     Scheduled Medications: . docusate sodium  100 mg Oral BID  . doxycycline  100 mg Oral Q12H  . enoxaparin (LOVENOX) injection  40 mg Subcutaneous Q24H  . feeding supplement (ENSURE ENLIVE)  237 mL Oral TID BM  . mouth rinse  15 mL Mouth Rinse BID  . metoprolol tartrate  50 mg Oral BID  . multivitamin with minerals  1 tablet Oral Daily  . pantoprazole  40 mg Oral Daily  . polyethylene glycol  17 g Oral Daily  . sacubitril-valsartan  1 tablet Oral BID  . sodium chloride flush  10-40 mL Intracatheter Q12H    Infusions: . sodium chloride Stopped (05/30/20 2243)  . DAPTOmycin (CUBICIN)  IV 500 mg (06/06/20 2017)    PRN Medications: acetaminophen **OR** acetaminophen, ipratropium-albuterol, metoprolol tartrate, ondansetron **OR** ondansetron (ZOFRAN) IV, sodium chloride flush, traMADol, zolpidem   Assessment/Plan   1. 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 There was also amall flickering leaflet mass, suggestive of vegetation of the pulmonic valve. No mitral or aortic valve pathology. - s/p Angiovac debulking on 7/20 but still with large vegetation  - abx per ID. Now off vancomycin, now on daptomycin . Will need another week per Dr Drue Second.  -  Will eventually need TV repair if he is a candidate.   2. Acute Hypoxic Respiratory Failure - Resolved. Extubated 7/28 . Sats stable on room air.   3. Septic Shock  - 2/2 MRSA Bacteremia - resolved  - continue abx per ID as above.   4. Acute Systolic  Heart Failure - initial Echo 7/9 w/ normal LVEF 50-55%. RV normal - TEE 7/16, LVEF normal 60-65%, RV normal - Limited Echo 7/24, LVEF severely reduced 20-25%, RV systolic function hyperdynamic. MV w/ mild-mod MR but no seen vegetation  - Hs trop 192 -> 190. Trend flat, most c/w demand ischemia and not ACS - suspect acute drop in EF stress induced from septic shock. Doubt CAD - Repeat limited echo 7/29 w/ interval improvement, LVEF up to 30-35% - He remains tachycardic on lopressor 50 mg twice a day. SBP soft. Stop lopressor and add coreg 6.25 mg twice a day.  - Add corlanor 5 mg twice a day to see if this will help with heart rate.   - Continue Entresto 24-26 mg bid  - Restart 12.5 spironolactone daily.    5. Anemia - transfuse for hgb <7.0   6. Hypokalemia - Check BMET    Length of Stay: 40  Tonye Becket, NP  06/07/2020, 2:49 PM  Advanced Heart Failure Team Pager [phone] (M-F; 7a -  4p)  Please contact [facility] for night-coverage after hours (4p -7a ) and weekends on [email or web address] Tonye Becket, NP  2:49 PM  Patient seen and examined with the above-signed Advanced Practice Provider and/or Housestaff. I personally reviewed laboratory data, imaging studies and relevant notes. I independently examined the patient and formulated the important aspects of the plan. I have edited the note to reflect any of my changes or salient points. I have personally discussed the plan with the patient and/or family.  Unclear why he remains so tachycardic.Otherwise feels ok. No infectious symptoms  General:  Walking halls. No resp difficulty HEENT: normal Neck: supple. no JVD. Carotids 2+ bilat; no bruits. No lymphadenopathy or thryomegaly appreciated. Cor: PMI nondisplaced. Regular tachy. 3/6 TR Lungs: clear Abdomen: soft, nontender, nondistended. No hepatosplenomegaly. No bruits or masses. Good bowel sounds. Extremities: no cyanosis, clubbing, rash, edema Neuro: alert & orientedx3, cranial nerves  grossly intact. moves all 4 extremities w/o difficulty. Affect pleasant  As above, I am not sure why he is so tachycardic. Probably worth repeating limited echo to reassess ventricular function. Agree with med changes as above. Will need dental evaluation if he has not already had one. Wil order echo and panorax.  Arvilla Meres, MD  8:04 PM

## 2020-06-08 ENCOUNTER — Inpatient Hospital Stay (HOSPITAL_COMMUNITY): Payer: Self-pay

## 2020-06-08 DIAGNOSIS — I362 Nonrheumatic tricuspid (valve) stenosis with insufficiency: Secondary | ICD-10-CM

## 2020-06-08 LAB — CBC
HCT: 31.5 % — ABNORMAL LOW (ref 39.0–52.0)
Hemoglobin: 9.7 g/dL — ABNORMAL LOW (ref 13.0–17.0)
MCH: 28.3 pg (ref 26.0–34.0)
MCHC: 30.8 g/dL (ref 30.0–36.0)
MCV: 91.8 fL (ref 80.0–100.0)
Platelets: 268 10*3/uL (ref 150–400)
RBC: 3.43 MIL/uL — ABNORMAL LOW (ref 4.22–5.81)
RDW: 19 % — ABNORMAL HIGH (ref 11.5–15.5)
WBC: 9.1 10*3/uL (ref 4.0–10.5)
nRBC: 0 % (ref 0.0–0.2)

## 2020-06-08 LAB — BASIC METABOLIC PANEL
Anion gap: 11 (ref 5–15)
BUN: 29 mg/dL — ABNORMAL HIGH (ref 6–20)
CO2: 24 mmol/L (ref 22–32)
Calcium: 9.4 mg/dL (ref 8.9–10.3)
Chloride: 102 mmol/L (ref 98–111)
Creatinine, Ser: 0.75 mg/dL (ref 0.61–1.24)
GFR calc Af Amer: >60 mL/min (ref >60)
GFR calc non Af Amer: >60 mL/min (ref >60)
Glucose, Bld: 109 mg/dL — ABNORMAL HIGH (ref 70–99)
Potassium: 4.3 mmol/L (ref 3.5–5.1)
Sodium: 137 mmol/L (ref 135–145)

## 2020-06-08 LAB — ECHOCARDIOGRAM LIMITED
Height: 67 in
Weight: 2104 oz

## 2020-06-08 LAB — CK: Total CK: 17 U/L — ABNORMAL LOW (ref 49–397)
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
## 2020-06-08 NOTE — Progress Notes (Signed)
CSW received request to assist patient with forms for [facility]. CSW spoke with June at [facility]. She requested CSW fax clinicals to f. [phone] for review as patient has scheduled his intake appointment for Monday. CSW explained that patient requires at least one more week of inpatient treatment and would be unable to complete intake on Monday. She stated that they would need to reschedule and patient may be denied based on medically acuity. CSW requested to hold off on the referral until patient is more medically ready so as not to disqualify him from the program.   Cristobal Goldmann LCSW

## 2020-06-08 NOTE — Progress Notes (Signed)
[facility]  Date of Admission:  04/28/2020     Total days of antibiotics 41            ASSESSMENT:  Clifford Clark feels better after discussion. We discussed recurrent, difficult to clear bacteremia following angiovac procedure and reservations that we are not quite done with IV therapy yet.  Especially in the setting of hopefully getting him set up for a valve replacement in the future. Would continue for another week with IV therapy for now. Agreed with him that drug rehab would be essential for him, but we want to ensure he is ready medically.  Offered chaplain support - he will consider but his mother has been foundational for him during this illness.   We discussed with nursing team if he would be able to go outside under supervision and with his mother. I think this would be good for his mental health.    PLAN: 1. Continue for another week of dual therapy and reassess  2. Please consider allowing the patient outside with supervision    Principal Problem:   Endocarditis Active Problems:   Septic embolism (HCC)   MSSA bacteremia   Hand abscess   Intravenous drug abuse (HCC)   Sepsis (HCC)   Thrombocytopenia (HCC)   Hypokalemia   Hyponatremia   Malnutrition of moderate degree   Threatening behavior   Elevated BUN   Pressure injury of skin   Chest pain   Acute respiratory failure (HCC)   Acute systolic heart failure (HCC)   . carvedilol  6.25 mg Oral BID WC  . docusate sodium  100 mg Oral BID  . doxycycline  100 mg Oral Q12H  . enoxaparin (LOVENOX) injection  40 mg Subcutaneous Q24H  . feeding supplement (ENSURE ENLIVE)  237 mL Oral TID BM  . ivabradine  5 mg Oral BID WC  . mouth rinse  15 mL Mouth Rinse BID  . multivitamin with minerals  1 tablet Oral Daily  . pantoprazole  40 mg Oral Daily  . polyethylene glycol  17 g Oral Daily  . sacubitril-valsartan  1 tablet Oral BID  . sodium chloride flush  10-40 mL Intracatheter Q12H  .  spironolactone  12.5 mg Oral Daily    SUBJECTIVE:  Feels like he isn't having any of the "throat heart beats" today. HR seems a little better overall in his perspective.  Overwhelmed with several things going on in personal life. Tearful today in discussion with reminiscing about his struggles the last 5 years with drug use.  He would really like to go outside.    No Known Allergies   Review of Systems: Review of Systems  Constitutional: Negative for chills, fever and weight loss.  Respiratory: Negative for cough, shortness of breath and wheezing.   Cardiovascular: Negative for chest pain and leg swelling.  Gastrointestinal: Negative for abdominal pain, constipation, diarrhea, nausea and vomiting.  Skin: Negative for rash.      OBJECTIVE: Vitals:   06/08/20 0100 06/08/20 0300 06/08/20 0832 06/08/20 0900  BP:  (!) 82/57 94/60 107/64  Pulse:  (!) 115 98 (!) 107  Resp:  18  20  Temp:  97.6 F (36.4 C)  98.7 F (37.1 C)  TempSrc:  Oral  Oral  SpO2:  100%  99%  Weight: 59.6 kg     Height:       Body mass index is 20.6 kg/m.  Physical Exam Constitutional:      General: He is  not in acute distress.    Appearance: He is well-developed.  Cardiovascular:     Rate and Rhythm: Normal rate and regular rhythm.     Heart sounds: Normal heart sounds.  Pulmonary:     Effort: Pulmonary effort is normal.     Breath sounds: Normal breath sounds.  Skin:    General: Skin is warm and dry.  Neurological:     Mental Status: He is alert and oriented to person, place, and time.  Psychiatric:        Behavior: Behavior normal.        Thought Content: Thought content normal.        Judgment: Judgment normal.     Lab Results Lab Results  Component Value Date   WBC 12.9 (H) 06/01/2020   HGB 10.3 (L) 06/01/2020   HCT 33.6 (L) 06/01/2020   MCV 88.9 06/01/2020   PLT 334 06/01/2020    Lab Results  Component Value Date   CREATININE 0.70 06/01/2020   BUN 25 (H) 06/01/2020   NA  137 06/01/2020   K 4.6 06/01/2020   CL 102 06/01/2020   CO2 27 06/01/2020    Lab Results  Component Value Date   ALT 114 (H) 05/25/2020   AST 84 (H) 05/25/2020   ALKPHOS 95 05/25/2020   BILITOT 0.4 05/25/2020     Microbiology: No results found for this or any previous visit (from the past 240 hour(s)).    Rexene Alberts, MSN, NP-C [facility]  [email or web address] Pager: [phone] Office: [phone] [facility] Main Line: [phone]
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
## 2020-06-08 NOTE — Progress Notes (Signed)
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 s/p angiovac by CVTS 7/20. Intubated 7/23-7/28. PMHx: IVDU, asthma    PT Comments    Pt admitted with above diagnosis. Pt was able to ambulate with good stability.  Able to ascend and descend steps with better HR response than in previous sessions.  Progressing well.   Pt currently with functional limitations due to balance and endurance deficits. Pt will benefit from skilled PT to increase their independence and safety with mobility to allow discharge to the venue listed below.     Follow Up Recommendations  Home health PT;Supervision/Assistance - 24 hour     Equipment Recommendations  None recommended by PT    Recommendations for Other Services OT consult     Precautions / Restrictions Precautions Precautions: Fall Precaution Comments: monitor HR, BP Restrictions Weight Bearing Restrictions: No    Mobility  Bed Mobility Overal bed mobility: Modified Independent Bed Mobility: Supine to Sit;Sit to Supine     Supine to sit: Supervision Sit to supine: Supervision      Transfers Overall transfer level: Needs assistance Equipment used: None Transfers: Sit to/from Stand Sit to Stand: Min guard         General transfer comment: No difficult with standing  Ambulation/Gait Ambulation/Gait assistance: Supervision Gait Distance (Feet): 450 Feet Assistive device: None Gait Pattern/deviations: Step-through pattern;Decreased stride length Gait velocity: decreased Gait velocity interpretation: 1.31 - 2.62 ft/sec, indicative of limited community ambulator General Gait Details: able to ambulate as slow steady pace without AD, and maintaining HR 105-117 bpm. had to cut walk short due to  Echo tech waitng for pt in hallway when walking.    Stairs Stairs: Yes Stairs assistance: Supervision Stair Management: One rail Right;Step to pattern;Forwards Number of Stairs: 20 General stair comments: Pt able to complete flight and needed a standing rest break at the top of the steps due to DOe 3/4.  Then able to descend steps.     Wheelchair Mobility    Modified Rankin (Stroke Patients Only)       Balance Overall balance assessment: Needs assistance Sitting-balance support: No upper extremity supported;Feet supported Sitting balance-Leahy Scale: Good     Standing balance support: No upper extremity supported Standing balance-Leahy Scale: Good Standing balance comment: steady in static and dynamic balance                            Cognition Arousal/Alertness: Awake/alert Behavior During Therapy: WFL for tasks assessed/performed Overall Cognitive Status: Impaired/Different from baseline Area of Impairment: Safety/judgement;Problem solving                 Orientation Level: Disoriented to;Time Current Attention Level: Selective     Safety/Judgement: Decreased awareness of safety;Decreased awareness of deficits Awareness: Emergent Problem Solving: Requires verbal cues        Exercises      General Comments        Pertinent Vitals/Pain Pain Assessment: No/denies pain    Home Living                      Prior Function            PT Goals (current goals can now be  found in the care plan section) Acute Rehab PT Goals Patient Stated Goal: return to independence/drug rehab Progress towards PT goals: Progressing toward goals    Frequency    Min 3X/week      PT Plan Current plan remains appropriate    Co-evaluation              AM-PAC PT "6 Clicks" Mobility   Outcome Measure  Help needed turning from your back to your side while in a flat bed without using bedrails?: None Help needed moving from lying on your  back to sitting on the side of a flat bed without using bedrails?: None Help needed moving to and from a bed to a chair (including a wheelchair)?: None Help needed standing up from a chair using your arms (e.g., wheelchair or bedside chair)?: None Help needed to walk in hospital room?: None Help needed climbing 3-5 steps with a railing? : A Little 6 Click Score: 23    End of Session Equipment Utilized During Treatment: Gait belt Activity Tolerance: Patient tolerated treatment well Patient left: in bed;with call bell/phone within reach (with Echo tech) Nurse Communication: Mobility status PT Visit Diagnosis: Other abnormalities of gait and mobility (R26.89);Difficulty in walking, not elsewhere classified (R26.2)     Time: 9767-3419 PT Time Calculation (min) (ACUTE ONLY): 12 min  Charges:  $Gait Training: 8-22 mins                     Clifford Clark W,PT Acute Rehabilitation Services Pager:  [phone]  Office:  [phone]     Clifford Clark 06/08/2020, 12:50 PM

## 2020-06-08 NOTE — Progress Notes (Signed)
  Echocardiogram 2D Echocardiogram has been performed.  Tye Savoy 06/08/2020, 12:01 PM

## 2020-06-09 LAB — FUNGUS CULTURE RESULT

## 2020-06-09 LAB — PHOSPHORUS: Phosphorus: 4.5 mg/dL (ref 2.5–4.6)

## 2020-06-09 LAB — BASIC METABOLIC PANEL
Anion gap: 9 (ref 5–15)
BUN: 23 mg/dL — ABNORMAL HIGH (ref 6–20)
CO2: 25 mmol/L (ref 22–32)
Calcium: 9.3 mg/dL (ref 8.9–10.3)
Chloride: 102 mmol/L (ref 98–111)
Creatinine, Ser: 0.69 mg/dL (ref 0.61–1.24)
GFR calc Af Amer: >60 mL/min (ref >60)
GFR calc non Af Amer: >60 mL/min (ref >60)
Glucose, Bld: 99 mg/dL (ref 70–99)
Potassium: 4.1 mmol/L (ref 3.5–5.1)
Sodium: 136 mmol/L (ref 135–145)

## 2020-06-09 LAB — CBC
HCT: 32.3 % — ABNORMAL LOW (ref 39.0–52.0)
Hemoglobin: 10.1 g/dL — ABNORMAL LOW (ref 13.0–17.0)
MCH: 28.9 pg (ref 26.0–34.0)
MCHC: 31.3 g/dL (ref 30.0–36.0)
MCV: 92.3 fL (ref 80.0–100.0)
Platelets: 280 10*3/uL (ref 150–400)
RBC: 3.5 MIL/uL — ABNORMAL LOW (ref 4.22–5.81)
RDW: 18.7 % — ABNORMAL HIGH (ref 11.5–15.5)
WBC: 8.7 10*3/uL (ref 4.0–10.5)
nRBC: 0 % (ref 0.0–0.2)

## 2020-06-09 LAB — FUNGUS CULTURE WITH STAIN

## 2020-06-09 LAB — FUNGAL ORGANISM REFLEX

## 2020-06-09 LAB — MAGNESIUM: Magnesium: 2 mg/dL (ref 1.7–2.4)

## 2020-06-09 NOTE — Progress Notes (Addendum)
Advanced Heart Failure Rounding Note  PCP-Cardiologist: No primary care provider on file.    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 ER w/u showed multiple septic emboli to lungs with cavitary lesions. 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  Also found to have emboli to kidneys and hands. TEE with EF 60-65% small PFO. large TV vegetation with severe TR. No vegetations on Aov/MV despite left-sided emboli.  Underwent AngioJet removal of part of vegetation. Then developed MRSA sepsis and respiratory failure. Intubated and started on pressors. F/u echo LVEF 20-25%. RV normal.  hstrop 192 -> 190.     Subjective:    Denies SOB. Tachycardia improved.   ECHO EF 35-40 large vegetation TV.   Objective:   Weight Range: 59.8 kg Body mass index is 20.64 kg/m.   Vital Signs:   Temp:  [97.8 F (36.6 C)-98.7 F (37.1 C)] 97.8 F (36.6 C) (08/19 0008) Pulse Rate:  [92-107] 93 (08/19 0008) Resp:  [18-20] 18 (08/19 0008) BP: (94-107)/(53-64) 96/53 (08/19 0008) SpO2:  [98 %-99 %] 98 % (08/19 0008) Weight:  [59.8 kg] 59.8 kg (08/19 0012) Last BM Date: 06/08/20  Weight change: Filed Weights   06/08/20 0038 06/08/20 0100 06/09/20 0012  Weight: 59.6 kg 59.6 kg 59.8 kg    Intake/Output:   Intake/Output Summary (Last 24 hours) at 06/09/2020 0519 Last data filed at 06/08/2020 0900 Gross per 24 hour  Intake 240 ml  Output --  Net 240 ml      Physical Exam    General:  Thin.  No resp difficulty HEENT: normal Neck: supple. no JVD. Carotids 2+ bilat; no bruits. No lymphadenopathy or thryomegaly appreciated. Cor: PMI nondisplaced. Regular rate & rhythm. No rubs, gallops. 2/6 TR. Lungs: clear Abdomen: soft, nontender, nondistended. No hepatosplenomegaly. No bruits or masses. Good bowel sounds. Extremities: no cyanosis, clubbing, rash, edema Neuro: alert & orientedx3, cranial nerves grossly intact. moves all 4 extremities  w/o difficulty. Affect pleasant   Telemetry   SR-ST 90-100s   Labs    CBC Recent Labs    06/08/20 1706  WBC 9.1  HGB 9.7*  HCT 31.5*  MCV 91.8  PLT 268   Basic Metabolic Panel Recent Labs    38/18/29 1706  NA 137  K 4.3  CL 102  CO2 24  GLUCOSE 109*  BUN 29*  CREATININE 0.75  CALCIUM 9.4   Liver Function Tests No results for input(s): AST, ALT, ALKPHOS, BILITOT, PROT, ALBUMIN in the last 72 hours. No results for input(s): LIPASE, AMYLASE in the last 72 hours. Cardiac Enzymes Recent Labs    06/08/20 1706  CKTOTAL 17*    BNP: BNP (last 3 results) No results for input(s): BNP in the last 8760 hours.  ProBNP (last 3 results) No results for input(s): PROBNP in the last 8760 hours.   D-Dimer No results for input(s): DDIMER in the last 72 hours. Hemoglobin A1C No results for input(s): HGBA1C in the last 72 hours. Fasting Lipid Panel No results for input(s): CHOL, HDL, LDLCALC, TRIG, CHOLHDL, LDLDIRECT in the last 72 hours. Thyroid Function Tests No results for input(s): TSH, T4TOTAL, T3FREE, THYROIDAB in the last 72 hours.  Invalid input(s): FREET3  Other results:   Imaging    DG Orthopantogram  Result Date: 06/08/2020 CLINICAL DATA:  Tricuspid valve endocarditis with concern for dental etiology for infection EXAM: ORTHOPANTOGRAM/PANORAMIC COMPARISON:  None. FINDINGS: There  is extensive cavity involving the right superior first molar with lucency in the root region, likely of infectious etiology. Extensive cavity noted in the right inferior premolar. Several teeth are missing. No bony destruction. No fracture or dislocation IMPRESSION: Carious changes, most notable in the superior right first molar and inferior right premolar. Several teeth missing. No bony destruction. Electronically Signed   By: Bretta Bang III M.D.   On: 06/08/2020 08:54   ECHOCARDIOGRAM LIMITED  Result Date: 06/08/2020    ECHOCARDIOGRAM LIMITED REPORT   Patient Name:   Clifford Clark Date of Exam: 06/08/2020 Medical Rec #:  [id number]     Height:       67.0 in Accession #:    [id number]    Weight:       131.5 lb Date of Birth:  October 29, 1990     BSA:          1.692 m Patient Age:    29 years      BP:           107/64 mmHg Patient Gender: M             HR:           89 bpm. Exam Location:  Inpatient Procedure: Limited Echo, Limited Color Doppler and Cardiac Doppler Indications:    Tricuspid Valve Disease I07.9  History:        Patient has prior history of Echocardiogram examinations, most                 recent 05/19/2020.  Sonographer:    Thurman Coyer RDCS (AE) Referring Phys: [id number] Keeli Roberg R Rhonin Trott IMPRESSIONS  1. Large vegetatons persist on multiple leaflets with sevee TR. No change in size since echo done 05/19/20 pericardial effuison seems trivial now . The tricuspid valve is abnormal. Severe tricuspid stenosis.  2. There is normal pulmonary artery systolic pressure. FINDINGS  Right Ventricle: There is normal pulmonary artery systolic pressure. The tricuspid regurgitant velocity is 2.50 m/s, and with an assumed right atrial pressure of 8 mmHg, the estimated right ventricular systolic pressure is 33.0 mmHg. Tricuspid Valve: Large vegetatons persist on multiple leaflets with sevee TR. No change in size since echo done 05/19/20 pericardial effuison seems trivial now. The tricuspid valve is abnormal. Severe tricuspid stenosis. TRICUSPID VALVE TR Peak grad:   25.0 mmHg TR Vmax:        250.00 cm/s Charlton Haws MD Electronically signed by Charlton Haws MD Signature Date/Time: 06/08/2020/2:00:11 PM    Final      Medications:     Scheduled Medications: . carvedilol  6.25 mg Oral BID WC  . docusate sodium  100 mg Oral BID  . doxycycline  100 mg Oral Q12H  . enoxaparin (LOVENOX) injection  40 mg Subcutaneous Q24H  . feeding supplement (ENSURE ENLIVE)  237 mL Oral TID BM  . ivabradine  5 mg Oral BID WC  . mouth rinse  15 mL Mouth Rinse BID  . multivitamin with minerals  1 tablet Oral  Daily  . pantoprazole  40 mg Oral Daily  . polyethylene glycol  17 g Oral Daily  . sacubitril-valsartan  1 tablet Oral BID  . sodium chloride flush  10-40 mL Intracatheter Q12H  . spironolactone  12.5 mg Oral Daily    Infusions: . sodium chloride 250 mL (06/08/20 2119)  . DAPTOmycin (CUBICIN)  IV 500 mg (06/08/20 2122)    PRN Medications: acetaminophen **OR** acetaminophen, ipratropium-albuterol, metoprolol tartrate, ondansetron **OR** ondansetron (ZOFRAN) IV,  sodium chloride flush, traMADol, zolpidem   Assessment/Plan   1. 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 There was also amall flickering leaflet mass, suggestive of vegetation of the pulmonic valve. No mitral or aortic valve pathology. - s/p Angiovac debulking on 7/20 but still with large vegetation  - abx per ID. Now off vancomycin, now on daptomycin . Will need another week per Dr Drue Second.  -  Will eventually need TV repair if he is a candidate.  - Panorex- with multiple  Cavities. Needs Dental consult.   2. Acute Hypoxic Respiratory Failure - Resolved. Extubated 7/28 . Sats stable on room air.   3. Septic Shock  - 2/2 MRSA Bacteremia - resolved  - continue abx per ID as above.   4. Acute Systolic Heart Failure - initial Echo 7/9 w/ normal LVEF 50-55%. RV normal - TEE 7/16, LVEF normal 60-65%, RV normal - Limited Echo 7/24, LVEF severely reduced 20-25%, RV systolic function hyperdynamic. MV w/ mild-mod MR but no seen vegetation  - Hs trop 192 -> 190. Trend flat, most c/w demand ischemia and not ACS - suspect acute drop in EF stress induced from septic shock. Doubt CAD - Repeat limited echo 7/29 w/ interval improvement, LVEF up to 30-35%. Given ongoing tachycardia ECHO was repeated and showed EF 35-40% with on large vegetation, RV mildly reduced.  - Volume status  stable.   - Continue coreg 6.25 mg twice a day. BP remains soft. No room to titrate.  - Continue corlanor 5 mg twice a day. Heart better controlled.  - Continue Entresto 24-26 mg bid  - Continue  12.5 spironolactone daily.   - BMET pending.   5. Anemia - transfuse for hgb <7.0   6. Hypokalemia    Length of Stay: 69  Amy Clegg, NP  06/09/2020, 5:19 AM  Advanced Heart Failure Team Pager [phone] (M-F; 7a - 4p)  Please contact [facility] for night-coverage after hours (4p -7a ) and weekends on [email or web address] Tonye Becket, NP  5:19 AM   Patient seen and examined with the above-signed Advanced Practice Provider and/or Housestaff. I personally reviewed laboratory data, imaging studies and relevant notes. I independently examined the patient and formulated the important aspects of the plan. I have edited the note to reflect any of my changes or salient points. I have personally discussed the plan with the patient and/or family.  Feeling better. No SOB, orthopnea or PND.   General:  Well appearing. No resp difficulty HEENT: normal Neck: supple. no JVD. Carotids 2+ bilat; no bruits. No lymphadenopathy or thryomegaly appreciated. Cor: PMI nondisplaced. Regular rate & rhythm. 2/6 TR Lungs: clear Abdomen: soft, nontender, nondistended. No hepatosplenomegaly. No bruits or masses. Good bowel sounds. Extremities: no cyanosis, clubbing, rash, edema Neuro: alert & orientedx3, cranial nerves grossly intact. moves all 4 extremities w/o difficulty. Affect pleasant  Stable from a HF perspective. BP too soft to titrate meds further. Have reached out to Dr. Kristin Bruins to see for possible teeth extraction.   The HF team will follow from a distance. Please call with questions.   Arvilla Meres, MD  2:43 PM

## 2020-06-09 NOTE — Progress Notes (Addendum)
PROGRESS NOTE  Clifford Clark WGY:[id number] DOB: 1991-01-04 DOA: 04/28/2020 PCP: Patient, [name]   LOS: 42 days   Brief narrative: As per HPI,  29 y.o.malepast medical history of IVDA found on side of the road brought into the ED was found to have multiple septic emboli and cavitary lung lesions cultures growing MRSA bacteremia. Had to be intubated and placed on pressors, extubated on 05/19/2020 and off pressors. 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 2D echo showed an EF of 60% with a PFO and a large vegetation in the tricuspid valve. He underwent AngioJet removal of part of the vegetation,repeat a 2D echo showed an EF of 20%. Extubated on 05/19/2020.  Patient was then considered for transfer out of the ICU.  Assessment/Plan:  Principal Problem:   Endocarditis Active Problems:   Intravenous drug abuse (HCC)   Septic embolism (HCC)   Sepsis (HCC)   Thrombocytopenia (HCC)   Hypokalemia   Hyponatremia   MSSA bacteremia   Malnutrition of moderate degree   Threatening behavior   Hand abscess   Elevated BUN   Pressure injury of skin   Chest pain   Acute respiratory failure (HCC)   Acute systolic heart failure (HCC)   MRSA and MSSA bacteremia with tricuspid endocarditis with septic emboli Patient was initially on vancomycin and cefepime.  This has been changed to daptomycin and doxycycline as per ID. Patient underwent debulking with angio vac on 05/10/2020 due to large vegetation.  2D echocardiogram repeated on 05/19/2020 showed left ventricular ejection fraction of 30% with tricuspid vegetation.  Patient has been seen by CT surgery and will consider outpatient follow-up for tricuspid valve replacement after he completes his drug rehabilitation.    Patient had multiple septic emboli to his lung with cavitation.  Orthopantogram with caries and missing tooth. Status  post limited echocardiogram on 8/18 with EF of 35-40%.  Patient will continue dual antibiotic as per infectious disease.  Acute respiratory failure with hypoxia Resolved.Initially, required intubation; extubated on 05/18/2020.    Septic shock : Secondary to MRSA bacteremia.  Resolved.  Acute systolic heart failure question septic cardiomyopathy  Patient is currently on Coreg, Entresto and cardiology is on board.  He has also been started on spironolactone and ivabradine.  Cardiology on board.  Mild LV function improvement on repeat echo from 06/08/2020.  Patient will likely need to follow-up with heart failure team after discharge.  Normocytic anemia: Hemoglobin of 10.1 today..  Will transfuse for hemoglobin less than 7.  Hypokalemia: Potassium of 4.1.  Substance use disorder: History of opiates and cocaine abuse.  He is motivated for outpatient counseling rehabilitation.  Effort has been made for assessment at [facility].  Bilateral wrist abscess (MSSA): Patient underwent incision and drainage on 05/03/2020 by orthopedic surgery.  Culture grew MSSA.   Moderate protein calorie malnutrition Present on admission.  Continue Ensure TID  Sinus tachycardia. On Coreg, Corlanor to help with tachycardia which has improved   DVT prophylaxis: enoxaparin (LOVENOX) injection 40 mg Start: 05/11/20 0800 SCD's Start: 05/03/20 1058   Code Status: Full code   Family Communication: None today. Spoke with the patient's mother at bedside yesterday.  Status is: Inpatient  Remains inpatient appropriate because:Unsafe d/c plan, IV treatments appropriate due to intensity of illness or inability to take PO and Inpatient level of care appropriate due to severity of illness   Dispo: The patient is  from: Home              Anticipated d/c is to: Home with home health              Anticipated d/c date is: >3 days, follow cardiology, infectious disease recommendations.              Patient  currently is not medically stable to d/c.  Consultants:  Infectious disease  Cardiothoracic surgery  Cardiology  Pulmonary  Orthopedics  Procedures:  Angio vac on 05/10/2020  Antibiotics:  . Daptomycin IV, and doxycycline   Subjective: Today, patient was seen and examined at bedside.  Denies any fever, chills or rigor no pain but mildly short winded on exertion..  Objective: Vitals:   06/09/20 0008 06/09/20 0526  BP: (!) 96/53 (!) 90/58  Pulse: 93 83  Resp: 18 20  Temp: 97.8 F (36.6 C) 98.1 F (36.7 C)  SpO2: 98% 99%    Intake/Output Summary (Last 24 hours) at 06/09/2020 1117 Last data filed at 06/09/2020 1049 Gross per 24 hour  Intake 953.41 ml  Output --  Net 953.41 ml   Filed Weights   06/08/20 0038 06/08/20 0100 06/09/20 0012  Weight: 59.6 kg 59.6 kg 59.8 kg   Body mass index is 20.64 kg/m.   Physical Exam: GENERAL: Patient is alert awake and oriented. Not in obvious distress.  Thinly built HENT: No scleral pallor or icterus. Pupils equally reactive to light. Oral mucosa is moist NECK: is supple, no gross swelling noted. CHEST: Clear to auscultation. No crackles or wheezes.  Diminished breath sounds bilaterally. CVS: S1 and S2 heard, systolic murmur noted.  Regular rate and rhythm.  ABDOMEN: Soft, non-tender, bowel sounds are present. EXTREMITIES: No edema. CNS: Cranial nerves are intact. No focal motor deficits. SKIN: warm and dry without rashes.  Data Review: I have personally reviewed the following laboratory data and studies,  CBC: Recent Labs  Lab 06/08/20 1706 06/09/20 0935  WBC 9.1 8.7  HGB 9.7* 10.1*  HCT 31.5* 32.3*  MCV 91.8 92.3  PLT 268 280   Basic Metabolic Panel: Recent Labs  Lab 06/08/20 1706 06/09/20 0935  NA 137 136  K 4.3 4.1  CL 102 102  CO2 24 25  GLUCOSE 109* 99  BUN 29* 23*  CREATININE 0.75 0.69  CALCIUM 9.4 9.3  MG  --  2.0  PHOS  --  4.5   Liver Function Tests: No results for input(s): AST, ALT,  ALKPHOS, BILITOT, PROT, ALBUMIN in the last 168 hours. No results for input(s): LIPASE, AMYLASE in the last 168 hours. No results for input(s): AMMONIA in the last 168 hours. Cardiac Enzymes: Recent Labs  Lab 06/08/20 1706  CKTOTAL 17*   BNP (last 3 results) No results for input(s): BNP in the last 8760 hours.  ProBNP (last 3 results) No results for input(s): PROBNP in the last 8760 hours.  CBG: No results for input(s): GLUCAP in the last 168 hours. No results found for this or any previous visit (from the past 240 hour(s)).   Studies: DG Orthopantogram  Result Date: 06/08/2020 CLINICAL DATA:  Tricuspid valve endocarditis with concern for dental etiology for infection EXAM: ORTHOPANTOGRAM/PANORAMIC COMPARISON:  None. FINDINGS: There is extensive cavity involving the right superior first molar with lucency in the root region, likely of infectious etiology. Extensive cavity noted in the right inferior premolar. Several teeth are missing. No bony destruction. No fracture or dislocation IMPRESSION: Carious changes, most notable in the superior right first  molar and inferior right premolar. Several teeth missing. No bony destruction. Electronically Signed   By: Bretta Bang III M.D.   On: 06/08/2020 08:54   ECHOCARDIOGRAM LIMITED  Result Date: 06/08/2020    ECHOCARDIOGRAM LIMITED REPORT   Patient Name:   BOBY EYER Date of Exam: 06/08/2020 Medical Rec #:  [id number]     Height:       67.0 in Accession #:    [id number]    Weight:       131.5 lb Date of Birth:  09/30/1991     BSA:          1.692 m Patient Age:    29 years      BP:           107/64 mmHg Patient Gender: M             HR:           89 bpm. Exam Location:  Inpatient Procedure: Limited Echo, Limited Color Doppler and Cardiac Doppler Indications:    Tricuspid Valve Disease I07.9  History:        Patient has prior history of Echocardiogram examinations, most                 recent 05/19/2020.  Sonographer:    Thurman Coyer RDCS  (AE) Referring Phys: [id number] DANIEL R BENSIMHON IMPRESSIONS  1. Large vegetatons persist on multiple leaflets with sevee TR. No change in size since echo done 05/19/20 pericardial effuison seems trivial now . The tricuspid valve is abnormal. Severe tricuspid stenosis.  2. There is normal pulmonary artery systolic pressure. FINDINGS  Right Ventricle: There is normal pulmonary artery systolic pressure. The tricuspid regurgitant velocity is 2.50 m/s, and with an assumed right atrial pressure of 8 mmHg, the estimated right ventricular systolic pressure is 33.0 mmHg. Tricuspid Valve: Large vegetatons persist on multiple leaflets with sevee TR. No change in size since echo done 05/19/20 pericardial effuison seems trivial now. The tricuspid valve is abnormal. Severe tricuspid stenosis. TRICUSPID VALVE TR Peak grad:   25.0 mmHg TR Vmax:        250.00 cm/s Charlton Haws MD Electronically signed by Charlton Haws MD Signature Date/Time: 06/08/2020/2:00:11 PM    Final       Joycelyn Das, MD  [facility] 06/09/2020

## 2020-06-09 NOTE — Progress Notes (Signed)
Nutrition Follow-up  RD working remotely.  DOCUMENTATION CODES:   Non-severe (moderate) malnutrition in context of social or environmental circumstances  INTERVENTION:   -ContinueMVI with minerals daily -ContinueEnsure Enlive poTID, each supplement provides 350 kcal and 20 grams of protein -ContinueMagic cup TID with meals, each supplement provides 290 kcal and 9 grams of protein  NUTRITION DIAGNOSIS:   Moderate Malnutrition related to social / environmental circumstances as evidenced by energy intake < 75% for > or equal to 3 months, moderate fat depletion, moderate muscle depletion, percent weight loss.  Ongoing  GOAL:   Patient will meet greater than or equal to 90% of their needs  Progressing   MONITOR:   PO intake, Supplement acceptance, Labs, Weight trends  REASON FOR ASSESSMENT:   Consult Assessment of nutrition requirement/status  ASSESSMENT:   Pt admitted for multiple septic emboli suspicious for possible endocarditis in the setting of IV drug use resulting in sepsis. PMH significant for polysubstance abuse.  7/13 - s/p I&D of bilateral hand abscesses 7/16 - s/p TEE confirming TV vegetation and trace pulmonic valve vegetation 7/20 - s/p angiovac debridement of tricuspid valve 7/23- intubated 7/28- extubated 7/29- advanced to Heart Healthy diet  Reviewed I/O's: +833 ml x 24 hours and -2.9 L since 05/26/20  Attempted to speak with pt via call to hospital room phone, however, no answer.   Pt remains with good appetite. Noted meal completion 85-100%. Pt is consuming Ensure supplements per MAR.   Per ID notes, plan for another week for IV antibiotics.   TOC team assisting in potential placement in substance abuse rehab as an outpatient.   Labs reviewed.   Diet Order:   Diet Order            Diet Heart Room service appropriate? Yes; Fluid consistency: Thin  Diet effective now                 EDUCATION NEEDS:   Not appropriate for education  at this time  Skin:  Skin Assessment: Skin Integrity Issues: Skin Integrity Issues:: Incisions Stage II: - Incisions: lt arm, rt arm, rt neck, rt groin  Last BM:  06/07/20  Height:   Ht Readings from Last 1 Encounters:  05/13/20 5\' 7"  (1.702 m)    Weight:   Wt Readings from Last 1 Encounters:  06/09/20 59.8 kg   BMI:  Body mass index is 20.64 kg/m.  Estimated Nutritional Needs:   Kcal:  1850-2050  Protein:  105-120 grams  Fluid:  > 1.8 L    06/11/20, RD, LDN, CDCES Registered Dietitian II Certified Diabetes Care and Education Specialist Please refer to [facility] for RD and/or RD on-call/weekend/after hours pager

## 2020-06-10 ENCOUNTER — Encounter (HOSPITAL_COMMUNITY): Payer: Self-pay | Admitting: Internal Medicine

## 2020-06-10 DIAGNOSIS — K029 Dental caries, unspecified: Secondary | ICD-10-CM

## 2020-06-10 DIAGNOSIS — K08409 Partial loss of teeth, unspecified cause, unspecified class: Secondary | ICD-10-CM

## 2020-06-10 DIAGNOSIS — K06010 Localized gingival recession, unspecified: Secondary | ICD-10-CM

## 2020-06-10 DIAGNOSIS — K0530 Chronic periodontitis, unspecified: Secondary | ICD-10-CM

## 2020-06-10 DIAGNOSIS — K083 Retained dental root: Secondary | ICD-10-CM

## 2020-06-10 DIAGNOSIS — K036 Deposits [accretions] on teeth: Secondary | ICD-10-CM

## 2020-06-10 MED ORDER — CHLORHEXIDINE GLUCONATE 0.12 % MT SOLN
15.0000 mL | Freq: Two times a day (BID) | OROMUCOSAL | Status: DC
  Administered 2020-06-10 – 2020-06-17 (×15): 15 mL via OROMUCOSAL
  Filled 2020-06-10 (×15): qty 15

## 2020-06-10 NOTE — Consult Note (Signed)
DENTAL CONSULTATION  Date of Consultation:  06/10/2020 Patient Name:   Clifford HancockJake Misuraca Date of Birth:   [birth year] Medical Record Number: [id number]  VITALS: BP 101/62   Pulse 92   Temp 97.6 F (36.4 C) (Oral)   Resp 18   Ht 5\' 7"  (1.702 m)   Wt 58.6 kg   SpO2 100%   BMI 20.22 kg/m   CHIEF COMPLAINT: Patient referred by Dr. Gala RomneyBensimhon for a dental consultation.  HPI: Clifford HancockJake Relph is a 29 year old male recently diagnosed with tricuspid valve endocarditis.  Patient currently on IV daptomycin and oral doxycycline for treatment of the endocarditis through infectious disease.  Patient with anticipated tricuspid valve replacement in the future.  Patient is now seen as part of a medically necessary preheart valve surgery dental protocol examination.  The patient currently denies acute toothaches, swellings, or abscesses.  Patient was last seen for lower right molar dental extraction by report.  This was approximately 1 to 2 years ago.  Patient denies having any complications from the dental extraction.  Patient does not remember the name of the dentist that he saw at that time due to" being on drugs".  Patient denies having partial dentures.  Patient denies having dental phobia.   PROBLEM LIST: Patient Active Problem List   Diagnosis Date Noted   Acute systolic heart failure (HCC)    Acute respiratory failure (HCC)    Chest pain    Pressure injury of skin 05/08/2020   Hand abscess 05/02/2020   Elevated BUN 05/02/2020   Threatening behavior 05/01/2020   MSSA bacteremia 04/29/2020   Malnutrition of moderate degree 04/29/2020   Intravenous drug abuse (HCC) 04/28/2020   Septic embolism (HCC) 04/28/2020   Endocarditis 04/28/2020   Sepsis (HCC) 04/28/2020   Thrombocytopenia (HCC) 04/28/2020   Homeless 04/28/2020   Hypokalemia 04/28/2020   Hyponatremia 04/28/2020   Encounter to establish care 04/02/2019    PMH: Past Medical History:  Diagnosis Date   Asthma    Opiate abuse,  continuous (HCC)    Heroin    PSH: Past Surgical History:  Procedure Laterality Date   APPLICATION OF ANGIOVAC N/A 05/10/2020   Procedure: APPLICATION OF ANGIOVAC;  Surgeon: Corliss SkainsLightfoot, Harrell O, MD;  Location: [facility] OR;  Service: Vascular;  Laterality: N/A;   BUBBLE STUDY  05/06/2020   Procedure: BUBBLE STUDY;  Surgeon: Chrystie NoseHilty, Kenneth C, MD;  [facility];  Service: Cardiovascular;;   I & D EXTREMITY Bilateral 05/03/2020   Procedure: BILATERAL IRRIGATION AND DEBRIDEMENT HANDS;  Surgeon: Sheral ApleyMurphy, Timothy D, MD;  Location: [facility] ORS;  Service: Orthopedics;  Laterality: Bilateral;   TEE WITHOUT CARDIOVERSION N/A 05/06/2020   Procedure: TRANSESOPHAGEAL ECHOCARDIOGRAM (TEE);  Surgeon: Chrystie NoseHilty, Kenneth C, MD;  [facility];  Service: Cardiovascular;  Laterality: N/A;   TEE WITHOUT CARDIOVERSION N/A 05/10/2020   Procedure: TRANSESOPHAGEAL ECHOCARDIOGRAM (TEE);  Surgeon: Corliss SkainsLightfoot, Harrell O, MD;  [facility];  Service: Thoracic;  Laterality: N/A;    ALLERGIES: No Known Allergies  MEDICATIONS: Current Facility-Administered Medications  Medication Dose Route Frequency Provider Last Rate Last Admin   0.9 %  sodium chloride infusion  250 mL Intravenous Continuous Elige Radonhristian, Rylee, MD   Stopped at 06/08/20 2209   acetaminophen (TYLENOL) tablet 650 mg  650 mg Oral Q6H PRN Ardelle BallsZimmerman, Donielle M, PA-C   650 mg at 05/14/20 0551   Or   acetaminophen (TYLENOL) suppository 650 mg  650 mg Rectal Q6H PRN Ardelle BallsZimmerman, Donielle M, PA-C   650 mg at 05/18/20 1157  carvedilol (COREG) tablet 6.25 mg  6.25 mg Oral BID WC Clegg, Amy D, NP   6.25 mg at 06/10/20 0905   DAPTOmycin (CUBICIN) 500 mg in sodium chloride 0.9 % IVPB  500 mg Intravenous Q2000 Daiva Eves, Lisette Grinder, MD 220 mL/hr at 06/09/20 2111 500 mg at 06/09/20 2111   docusate sodium (COLACE) capsule 100 mg  100 mg Oral BID Oretha Milch, MD   100 mg at 06/10/20 0905   doxycycline (VIBRA-TABS) tablet 100 mg  100 mg Oral Q12H Daiva Eves, Lisette Grinder, MD   100 mg at 06/10/20 0904   enoxaparin (LOVENOX) injection 40 mg  40 mg Subcutaneous Q24H Doree Fudge M, PA-C   40 mg at 06/08/20 6010   feeding supplement (ENSURE ENLIVE) (ENSURE ENLIVE) liquid 237 mL  237 mL Oral TID BM Marinda Elk, MD   237 mL at 06/10/20 0904   ipratropium-albuterol (DUONEB) 0.5-2.5 (3) MG/3ML nebulizer solution 3 mL  3 mL Nebulization Q4H PRN Doree Fudge M, PA-C       ivabradine (CORLANOR) tablet 5 mg  5 mg Oral BID WC Clegg, Amy D, NP   5 mg at 06/10/20 9323   MEDLINE mouth rinse  15 mL Mouth Rinse BID Oretha Milch, MD   15 mL at 06/10/20 0909   metoprolol tartrate (LOPRESSOR) injection 2.5 mg  2.5 mg Intravenous Q6H PRN Jerald Kief, MD   2.5 mg at 06/05/20 1755   multivitamin with minerals tablet 1 tablet  1 tablet Oral Daily Marinda Elk, MD   1 tablet at 06/09/20 1737   ondansetron (ZOFRAN) tablet 4 mg  4 mg Oral Q6H PRN Doree Fudge M, PA-C       Or   ondansetron [facility]) injection 4 mg  4 mg Intravenous Q6H PRN Doree Fudge M, PA-C   4 mg at 05/04/20 1219   pantoprazole (PROTONIX) EC tablet 40 mg  40 mg Oral Daily Mosetta Anis, RPH   40 mg at 06/10/20 5573   polyethylene glycol (MIRALAX / GLYCOLAX) packet 17 g  17 g Oral Daily Mosetta Anis, RPH   17 g at 05/29/20 0957   sacubitril-valsartan (ENTRESTO) 24-26 mg per tablet  1 tablet Oral BID Bensimhon, [name], MD   1 tablet at 06/10/20 0905   sodium chloride flush (NS) 0.9 % injection 10-40 mL  10-40 mL Intracatheter Q12H Kyle, Tyrone A, DO   10 mL at 06/10/20 2202   sodium chloride flush (NS) 0.9 % injection 10-40 mL  10-40 mL Intracatheter PRN Ronaldo Miyamoto, Tyrone A, DO       spironolactone (ALDACTONE) tablet 12.5 mg  12.5 mg Oral Daily Clegg, Amy D, NP   12.5 mg at 06/10/20 0905   traMADol (ULTRAM) tablet 50 mg  50 mg Oral Q6H PRN Marinda Elk, MD   50 mg at 06/08/20 2113   zolpidem (AMBIEN) tablet 5 mg  5 mg Oral QHS PRN Briscoe Deutscher, MD   5 mg  at 06/08/20 2112    LABS: Lab Results  Component Value Date   WBC 8.7 06/09/2020   HGB 10.1 (L) 06/09/2020   HCT 32.3 (L) 06/09/2020   MCV 92.3 06/09/2020   PLT 280 06/09/2020      Component Value Date/Time   NA 136 06/09/2020 0935   K 4.1 06/09/2020 0935   CL 102 06/09/2020 0935   CO2 25 06/09/2020 0935   GLUCOSE 99 06/09/2020 0935   BUN 23 (H) 06/09/2020  0935   CREATININE 0.69 06/09/2020 0935   CALCIUM 9.3 06/09/2020 0935   GFRNONAA >60 06/09/2020 0935   GFRAA >60 06/09/2020 0935   Lab Results  Component Value Date   INR 1.4 (H) 05/15/2020   INR 1.4 (H) 04/28/2020   No results found for: PTT  SOCIAL HISTORY: Social History   Socioeconomic History   Marital status: Single    Spouse name: Not on file   Number of children: Not on file   Years of education: Not on file   Highest education level: Not on file  Occupational History   Not on file  Tobacco Use   Smoking status: Former Smoker    Packs/day: 0.50   Smokeless tobacco: Never Used   Tobacco comment: using nicotine patches  Vaping Use   Vaping Use: Never used  Substance and Sexual Activity   Alcohol use: Not Currently    Comment: rarely   Drug use: Not Currently    Types: Marijuana, IV, "Crack" cocaine, Cocaine, Fentanyl, Methamphetamines, Amphetamines    Comment: Heroin   Sexual activity: Yes  Other Topics Concern   Not on file  Social History Narrative   Not on file   Social Determinants of Health   Financial Resource Strain:    Difficulty of Paying Living Expenses: Not on file  Food Insecurity:    Worried About Running Out of Food in the Last Year: Not on file   [employer] of Food in the Last Year: Not on file  Transportation Needs:    Lack of Transportation (Medical): Not on file   Lack of Transportation (Non-Medical): Not on file  Physical Activity:    Days of Exercise per Week: Not on file   Minutes of Exercise per Session: Not on file  Stress:    Feeling of Stress : Not on file   Social Connections:    Frequency of Communication with Friends and Family: Not on file   Frequency of Social Gatherings with Friends and Family: Not on file   Attends Religious Services: Not on file   Active Member of Clubs or Organizations: Not on file   Attends Banker Meetings: Not on file   Marital Status: Not on file  Intimate Partner Violence:    Fear of Current or Ex-Partner: Not on file   Emotionally Abused: Not on file   Physically Abused: Not on file   Sexually Abused: Not on file    FAMILY HISTORY: History reviewed. No pertinent family history.  REVIEW OF SYSTEMS: Reviewed with the patient as per History of present illness. Psych: Patient denies having dental phobia.  DENTAL HISTORY: CHIEF COMPLAINT: Patient referred by Dr. Gala Romney for dental consultation.  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  Patient currently on IV daptomycin and oral doxycycline for treatment of the endocarditis through infectious disease.  Patient with anticipated tricuspid valve replacement in the future.  Patient is now seen as part of a medically necessary preheart valve surgery dental protocol examination.  The patient currently denies acute toothaches, swellings, or abscesses.  Patient was last seen for lower right molar dental extraction by report.  This was approximately 1 to 2 years ago.  Patient denies having any complications from the dental extraction.  Patient does not remember the name of the dentist that he saw at that time due to" being on drugs".  Patient denies having partial dentures.  Patient denies having dental phobia.   DENTAL EXAMINATION: GENERAL:  The patient is a well-developed, slightly built male in no acute distress. HEAD AND NECK: There is no palpable neck lymphadenopathy.  The patient denies acute TMJ symptoms. INTRAORAL EXAM: Patient has normal saliva.  There is no evidence of oral abscess  formation. DENTITION: Patient with multiple missing teeth numbers 1, 16, 17, 19, and 31 and 32.  There are retained root segments in the area tooth numbers 3 and 28. PERIODONTAL: Patient has chronic periodontitis with plaque calculus accumulations, selective areas gingival recession, and incipient bone loss. DENTAL CARIES/SUBOPTIMAL RESTORATIONS: Rampant dental caries are noted affecting remaining dentition.   ENDODONTIC: Patient currently denies acute pulpitis symptoms.  Patient appears appropriate for radiolucency associated with retained root segments numbers 3 and 28. CROWN AND BRIDGE: There are no chronic bridge restorations PROSTHODONTIC: Patient denies having partial dentures. OCCLUSION: Patient has a poor occlusal scheme but a stable occlusion. RADIOGRAPHIC INTERPRETATION: An orthopantogram was taken on 06/08/2020. There are multiple missing teeth.  There are retained root segments in the area of tooth numbers 3 and 28.  Multiple dental caries are noted.  There are several areas of periapical pathology and radiolucency.  There is incipient bone loss noted.   ASSESSMENTS: 1.  Tricuspid valve endocarditis 2.  Preheart valve surgery dental protocol  3.  Multiple retained root segments 4.  Rampant dental caries 5.  Chronic periodontitis with bone loss 6.  Gingival recession 7.  Accretions 8.  Missing teeth 9.  Poor occlusal scheme and malocclusion 10.  Need for antibiotic premedication prior to invasive dental procedure due to history of endocarditis and anticipated heart valve surgery.   PLAN/RECOMMENDATIONS: 1. I discussed the risks, benefits, and complications of various treatment options with the patient in relationship to his medical and dental conditions, current endocarditis, anticipated tricuspid valve replacement, and future risk for endocarditis. We discussed various treatment options to include no treatment, multiple extractions with alveoloplasty, pre-prosthetic surgery as  indicated, periodontal therapy, dental restorations, root canal therapy, crown and bridge therapy, implant therapy, and replacement of missing teeth as indicated. The patient currently wishes to proceed with extraction of tooth numbers 3 and 28 with alveoloplasty and gross debridement of remaining dentition in the operating room with general anesthesia.  This will be scheduled for early next week in the operating room at [facility].  Patient will then need to have outpatient follow-up with the primary dentist of his choice for exam, radiographs, and additional dental treatment as indicated.   2. Discussion of findings with medical team and coordination of future medical and dental care as needed.    [name], DDS

## 2020-06-10 NOTE — Progress Notes (Signed)
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 s/p angiovac by CVTS 7/20. Intubated 7/23-7/28. PMHx: IVDU, asthma   OT comments  Pt progressing towards OT goals, recent stair session with PT, so focus on staying in room functional ADL, and fine motor/cognitive tasks. Practiced shuffling, dealing, and managing card game (attention, rule following, fine motor etc). In addition to sink level ADL. Pt very pleasant and motivated throughout session. Changed dc recommendations to no OT follow up and no DME needs. Pt continues to benefit from skilled acute OT to focus on cognition and fine motor at this time.   Follow Up Recommendations  No OT follow up    Equipment Recommendations  None recommended by OT    Recommendations for Other Services      Precautions / Restrictions Precautions Precautions: Fall Precaution Comments: monitor HR, BP Restrictions Weight Bearing Restrictions: No       Mobility Bed Mobility               General bed mobility comments: Pt in chair at beginning and end of session  Transfers Overall transfer level: Needs assistance Equipment used: None Transfers: Sit to/from Stand Sit to Stand: Independent              Balance Overall balance assessment: Needs assistance Sitting-balance support: No upper extremity supported;Feet supported Sitting balance-Leahy Scale: Good     Standing balance support: No upper extremity supported Standing balance-Leahy Scale: Good Standing balance comment: steady in static and dynamic balance                           ADL either performed or assessed with clinical judgement   ADL                                               Vision    Vision Assessment?: No apparent visual deficits   Perception     Praxis      Cognition Arousal/Alertness: Awake/alert Behavior During Therapy: WFL for tasks assessed/performed Overall Cognitive Status: Impaired/Different from baseline Area of Impairment: Safety/judgement;Problem solving                 Orientation Level: Disoriented to;Time Current Attention Level: Selective     Safety/Judgement: Decreased awareness of safety;Decreased awareness of deficits Awareness: Emergent Problem Solving: Requires verbal cues          Exercises Hand Activities Cards - Deal: Right;5 reps;Seated Cards - Flip: Right;5 reps;Seated Cards - Hand Shuffle: Both;5 reps;Seated Cards - Shuffle Limitations: increased time/effort   Shoulder Instructions       General Comments      Pertinent Vitals/ Pain       Pain Assessment: No/denies pain  Home Living                                          Prior Functioning/Environment              Frequency  Min 1X/week  Progress Toward Goals  OT Goals(current goals can now be found in the care plan section)  Progress towards OT goals: Progressing toward goals  Acute Rehab OT Goals Patient Stated Goal: return to independence/drug rehab OT Goal Formulation: With patient Time For Goal Achievement: 06/16/20 Potential to Achieve Goals: Good  Plan Discharge plan needs to be updated;Frequency needs to be updated    Co-evaluation                 AM-PAC OT "6 Clicks" Daily Activity     Outcome Measure   Help from another person eating meals?: None Help from another person taking care of personal grooming?: A Little Help from another person toileting, which includes using toliet, bedpan, or urinal?: A Little Help from another person bathing (including washing, rinsing, drying)?: A Little Help from another person to put on and taking off regular upper body clothing?: A Little Help from another  person to put on and taking off regular lower body clothing?: A Little 6 Click Score: 19    End of Session    OT Visit Diagnosis: Muscle weakness (generalized) (M62.81);Other symptoms and signs involving cognitive function   Activity Tolerance Patient tolerated treatment well   Patient Left in chair;with call bell/phone within reach   Nurse Communication Mobility status        Time: 6812-7517 OT Time Calculation (min): 18 min  Charges: OT General Charges $OT Visit: 1 Visit OT Treatments $Self Care/Home Management : 8-22 mins  Nyoka Cowden OTR/L Acute Rehabilitation Services Pager: [phone] Office: [phone]   Evern Bio Krysia Zahradnik 06/10/2020, 12:24 PM

## 2020-06-10 NOTE — Progress Notes (Addendum)
PROGRESS NOTE  Esaiah Wanless CBJ:[id number] DOB: 12/09/1990 DOA: 04/28/2020 PCP: Patient, [name]   LOS: 43 days   Brief narrative: As per HPI,  29 y.o.malepast medical history of IVDA found on side of the road brought into the ED was found to have multiple septic emboli and cavitary lung lesions cultures growing MRSA bacteremia. Had to be intubated and placed on pressors, extubated on 05/19/2020 and off pressors. 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 2D echo showed an EF of 60% with a PFO and a large vegetation in the tricuspid valve. He underwent AngioJet removal of part of the vegetation,repeat a 2D echo showed an EF of 20%. Extubated on 05/19/2020.  Patient was then considered for transfer out of the ICU.  Assessment/Plan:  Principal Problem:   Endocarditis Active Problems:   Intravenous drug abuse (HCC)   Septic embolism (HCC)   Sepsis (HCC)   Thrombocytopenia (HCC)   Hypokalemia   Hyponatremia   MSSA bacteremia   Malnutrition of moderate degree   Threatening behavior   Hand abscess   Elevated BUN   Pressure injury of skin   Chest pain   Acute respiratory failure (HCC)   Acute systolic heart failure (HCC)   MRSA and MSSA bacteremia with tricuspid endocarditis with septic emboli On daptomycin and doxycycline as per ID. Patient underwent debulking with angio vac on 05/10/2020 due to large vegetation.  2D echocardiogram repeated on 05/19/2020 showed left ventricular ejection fraction of 30% with tricuspid vegetation.  Patient has been seen by CT surgery and will consider outpatient follow-up for tricuspid valve replacement after he completes his drug rehabilitation.    Patient had multiple septic emboli to his lung with cavitation.  Orthopantogram with caries and missing tooth. Status post limited echocardiogram on 8/18 with EF of 35-40%.  Patient will  continue dual antibiotic as per infectious disease.  Poor oral dentition.  Patient has been seen by dentist.  Plan is extraction of the caries tooth and debridement under anesthesia.  Acute respiratory failure with hypoxia Resolved.Initially, required intubation; extubated on 05/18/2020.    Septic shock : Secondary to MRSA bacteremia.  Resolved.  Acute systolic heart failure question septic cardiomyopathy  Patient is currently on Coreg, Entresto, spironolactone and ivabradine.  Cardiology on board.  Mild LV function improvement on repeat echo from 06/08/2020.  Patient will likely need to follow-up with heart failure team after discharge.  Normocytic anemia: Hemoglobin of 10.1 today.  Will transfuse for hemoglobin less than 7.  Hypokalemia: Potassium of 4.1.  Closely monitor   Substance use disorder: History of opiates and cocaine abuse.  He is motivated for outpatient counseling rehabilitation.  Effort has been made for assessment at [facility].  Bilateral wrist abscess (MSSA): Patient underwent incision and drainage on 05/03/2020 by orthopedic surgery.  Culture grew MSSA.   Moderate protein calorie malnutrition Present on admission.  Continue Ensure TID  Sinus tachycardia. On Coreg, Corlanor to help with tachycardia which has improved   DVT prophylaxis: enoxaparin (LOVENOX) injection 40 mg Start: 05/11/20 0800 SCD's Start: 05/03/20 1058   Code Status: Full code   Family Communication: None today.   Status is: Inpatient  Remains inpatient appropriate because:Unsafe d/c plan, IV treatments appropriate due to intensity of illness or inability to take PO and Inpatient level of care appropriate due to severity of illness   Dispo: The patient is from: Home  Anticipated d/c is to: Home with home health as per home health/Behavioural health facility              Anticipated d/c date is: >3 days, follow cardiology, infectious disease recommendations,  plan for dental extraction.              Patient currently is not medically stable to d/c.  Consultants:  Infectious disease  Cardiothoracic surgery  Cardiology  Pulmonary  Orthopedics  Will general surgery  Procedures:  Angio vac on 05/10/2020  Antibiotics:  . Daptomycin IV, and doxycycline  Subjective: Today, patient was seen and examined at bedside.  Patient denies any fever, chills, nausea or vomiting or overt pain.  Objective: Vitals:   06/10/20 0011 06/10/20 0612  BP: (!) 90/53 (!) 93/52  Pulse: 88 85  Resp: 17 19  Temp: 98.3 F (36.8 C) 98.2 F (36.8 C)  SpO2: 99% 100%    Intake/Output Summary (Last 24 hours) at 06/10/2020 0836 Last data filed at 06/10/2020 7741 Gross per 24 hour  Intake 720 ml  Output --  Net 720 ml   Filed Weights   06/08/20 0100 06/09/20 0012 06/10/20 0612  Weight: 59.6 kg 59.8 kg 58.6 kg   Body mass index is 20.22 kg/m.   Physical Exam: GENERAL: Patient is alert awake and oriented. Not in obvious distress.  Thinly built HENT: No scleral pallor or icterus. Pupils equally reactive to light. Oral mucosa is moist .  Poor oral dentition NECK: is supple, no gross swelling noted. CHEST: Clear to auscultation. No crackles or wheezes.  Diminished breath sounds bilaterally. CVS: S1 and S2 heard, systolic murmur noted.  Regular rate and rhythm.  ABDOMEN: Soft, non-tender, bowel sounds are present. EXTREMITIES: No edema. CNS: Cranial nerves are intact. No focal motor deficits. SKIN: warm and dry without rashes.  Data Review: I have personally reviewed the following laboratory data and studies,  CBC: Recent Labs  Lab 06/08/20 1706 06/09/20 0935  WBC 9.1 8.7  HGB 9.7* 10.1*  HCT 31.5* 32.3*  MCV 91.8 92.3  PLT 268 280   Basic Metabolic Panel: Recent Labs  Lab 06/08/20 1706 06/09/20 0935  NA 137 136  K 4.3 4.1  CL 102 102  CO2 24 25  GLUCOSE 109* 99  BUN 29* 23*  CREATININE 0.75 0.69  CALCIUM 9.4 9.3  MG  --  2.0   PHOS  --  4.5   Liver Function Tests: No results for input(s): AST, ALT, ALKPHOS, BILITOT, PROT, ALBUMIN in the last 168 hours. No results for input(s): LIPASE, AMYLASE in the last 168 hours. No results for input(s): AMMONIA in the last 168 hours. Cardiac Enzymes: Recent Labs  Lab 06/08/20 1706  CKTOTAL 17*   BNP (last 3 results) No results for input(s): BNP in the last 8760 hours.  ProBNP (last 3 results) No results for input(s): PROBNP in the last 8760 hours.  CBG: No results for input(s): GLUCAP in the last 168 hours. No results found for this or any previous visit (from the past 240 hour(s)).   Studies: ECHOCARDIOGRAM LIMITED  Result Date: 06/08/2020    ECHOCARDIOGRAM LIMITED REPORT   Patient Name:   DURWARD MATRANGA Date of Exam: 06/08/2020 Medical Rec #:  [id number]     Height:       67.0 in Accession #:    [id number]    Weight:       131.5 lb Date of Birth:  [birth year]     BSA:  1.692 m Patient Age:    29 years      BP:           107/64 mmHg Patient Gender: M             HR:           89 bpm. Exam Location:  Inpatient Procedure: Limited Echo, Limited Color Doppler and Cardiac Doppler Indications:    Tricuspid Valve Disease I07.9  History:        Patient has prior history of Echocardiogram examinations, most                 recent 05/19/2020.  Sonographer:    Thurman Coyer RDCS (AE) Referring Phys: [id number] DANIEL R BENSIMHON IMPRESSIONS  1. Large vegetatons persist on multiple leaflets with sevee TR. No change in size since echo done 05/19/20 pericardial effuison seems trivial now . The tricuspid valve is abnormal. Severe tricuspid stenosis.  2. There is normal pulmonary artery systolic pressure. FINDINGS  Right Ventricle: There is normal pulmonary artery systolic pressure. The tricuspid regurgitant velocity is 2.50 m/s, and with an assumed right atrial pressure of 8 mmHg, the estimated right ventricular systolic pressure is 33.0 mmHg. Tricuspid Valve: Large vegetatons persist on  multiple leaflets with sevee TR. No change in size since echo done 05/19/20 pericardial effuison seems trivial now. The tricuspid valve is abnormal. Severe tricuspid stenosis. TRICUSPID VALVE TR Peak grad:   25.0 mmHg TR Vmax:        250.00 cm/s Charlton Haws MD Electronically signed by Charlton Haws MD Signature Date/Time: 06/08/2020/2:00:11 PM    Final       Joycelyn Das, MD  [facility] 06/10/2020

## 2020-06-10 NOTE — Progress Notes (Signed)
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 s/p angiovac by CVTS 7/20. Intubated 7/23-7/28. PMHx: IVDU, asthma    PT Comments    Pt admitted with above diagnosis. Pt able to ascend and descend 40 steps with HR 99-113 bpm with DOE 2/4 with better endurance each visit.  Will continue to follow pt and progress stair training. Pt is walking on unit on his own.  Pt currently with functional limitations due to the deficits listed below (see PT Problem List). Pt will benefit from skilled PT to increase their independence and safety with mobility to allow discharge to the venue listed below.     Follow Up Recommendations  Home health PT;Supervision/Assistance - 24 hour     Equipment Recommendations  None recommended by PT    Recommendations for Other Services OT consult     Precautions / Restrictions Precautions Precautions: Fall Precaution Comments: monitor HR, BP Restrictions Weight Bearing Restrictions: No    Mobility  Bed Mobility               General bed mobility comments: Pt in chair on arrival  Transfers Overall transfer level: Needs assistance Equipment used: None Transfers: Sit to/from Stand Sit to Stand: Independent            Ambulation/Gait Ambulation/Gait assistance: Independent Gait Distance (Feet): 500 Feet Assistive device: None Gait Pattern/deviations: Step-through pattern;Decreased stride length Gait velocity: decreased Gait velocity interpretation: 1.31 - 2.62 ft/sec, indicative of limited community ambulator General Gait Details: able to ambulate as slow steady pace without AD, and maintaining HR 99-113 bpm.    Stairs Stairs: Yes Stairs assistance: Supervision Stair Management: One rail  Right;Step to pattern;Forwards Number of Stairs: 40 General stair comments: Pt able to complete flight and needed a standing rest break at the top of the steps due to DOe 3/4.  Then able to descend steps.  HR 99-113 bpm with steps.    Wheelchair Mobility    Modified Rankin (Stroke Patients Only)       Balance Overall balance assessment: Needs assistance Sitting-balance support: No upper extremity supported;Feet supported Sitting balance-Leahy Scale: Good     Standing balance support: No upper extremity supported Standing balance-Leahy Scale: Good Standing balance comment: steady in static and dynamic balance                            Cognition Arousal/Alertness: Awake/alert Behavior During Therapy: WFL for tasks assessed/performed Overall Cognitive Status: Impaired/Different from baseline Area of Impairment: Safety/judgement;Problem solving                 Orientation Level: Disoriented to;Time Current Attention Level: Selective     Safety/Judgement: Decreased awareness of safety;Decreased awareness of deficits Awareness: Emergent Problem Solving: Requires verbal cues        Exercises      General Comments        Pertinent Vitals/Pain      Home Living                      Prior Function            PT Goals (current goals can now be found in the care plan section) Acute Rehab PT  Goals Patient Stated Goal: return to independence/drug rehab Progress towards PT goals: Progressing toward goals    Frequency    Min 2X/week      PT Plan Frequency needs to be updated    Co-evaluation              AM-PAC PT "6 Clicks" Mobility   Outcome Measure  Help needed turning from your back to your side while in a flat bed without using bedrails?: None Help needed moving from lying on your back to sitting on the side of a flat bed without using bedrails?: None Help needed moving to and from a bed to a chair (including a  wheelchair)?: None Help needed standing up from a chair using your arms (e.g., wheelchair or bedside chair)?: None Help needed to walk in hospital room?: None Help needed climbing 3-5 steps with a railing? : A Little 6 Click Score: 23    End of Session Equipment Utilized During Treatment: Gait belt Activity Tolerance: Patient tolerated treatment well Patient left: with call bell/phone within reach;in chair Nurse Communication: Mobility status PT Visit Diagnosis: Other abnormalities of gait and mobility (R26.89);Difficulty in walking, not elsewhere classified (R26.2)     Time: 7425-9563 PT Time Calculation (min) (ACUTE ONLY): 9 min  Charges:  $Gait Training: 8-22 mins                     Nahome Bublitz W,PT Acute Rehabilitation Services Pager:  [phone]  Office:  [phone]     Berline Lopes 06/10/2020, 12:05 PM

## 2020-06-10 NOTE — Progress Notes (Signed)
[facility]    Date of Admission:  04/28/2020   Total days of antibiotics            ID: Clifford Clark is a 29 y.o. male with   Principal Problem:   Endocarditis Active Problems:   Intravenous drug abuse (HCC)   Septic embolism (HCC)   Sepsis (HCC)   Thrombocytopenia (HCC)   Hypokalemia   Hyponatremia   MSSA bacteremia   Malnutrition of moderate degree   Threatening behavior   Hand abscess   Elevated BUN   Pressure injury of skin   Chest pain   Acute respiratory failure (HCC)   Acute systolic heart failure (HCC)    Subjective: Feeling better, noticing his heart rate is not racing. Afebrile, tolerating abtx, no diarrhea, fever or chills, less shortness of breath. Looking forward to going to rehab. Also being evaluated by dental for teeing up for possible valve surgery Medications:  . carvedilol  6.25 mg Oral BID WC  . chlorhexidine  15 mL Mouth/Throat BID  . docusate sodium  100 mg Oral BID  . doxycycline  100 mg Oral Q12H  . enoxaparin (LOVENOX) injection  40 mg Subcutaneous Q24H  . feeding supplement (ENSURE ENLIVE)  237 mL Oral TID BM  . ivabradine  5 mg Oral BID WC  . multivitamin with minerals  1 tablet Oral Daily  . pantoprazole  40 mg Oral Daily  . polyethylene glycol  17 g Oral Daily  . sacubitril-valsartan  1 tablet Oral BID  . sodium chloride flush  10-40 mL Intracatheter Q12H  . spironolactone  12.5 mg Oral Daily    Objective: Vital signs in last 24 hours: Temp:  [97.4 F (36.3 C)-98.3 F (36.8 C)] 97.7 F (36.5 C) (08/20 1206) Pulse Rate:  [85-93] 86 (08/20 1206) Resp:  [15-20] 20 (08/20 1206) BP: (90-102)/(52-62) 95/62 (08/20 1206) SpO2:  [99 %-100 %] 100 % (08/20 1206) Weight:  [58.6 kg] 58.6 kg (08/20 0612) Physical Exam  Constitutional: He is oriented to person, place, and time. He appears well-developed and well-nourished. No distress.  HENT:  Mouth/Throat: Oropharynx is clear and moist. No oropharyngeal exudate.   Cardiovascular: Normal rate, regular rhythm and normal heart sounds. +systolic murmur Pulmonary/Chest: Effort normal and breath sounds normal. No respiratory distress. He has no wheezes.  Abdominal: Soft. Bowel sounds are normal. He exhibits no distension. There is no tenderness.  Lymphadenopathy:  He has no cervical adenopathy.  Neurological: He is alert and oriented to person, place, and time.  Skin: Skin is warm and dry. No rash noted. No erythema.  Psychiatric: He has a normal mood and affect. His behavior is normal.     Lab Results Recent Labs    06/08/20 1706 06/09/20 0935  WBC 9.1 8.7  HGB 9.7* 10.1*  HCT 31.5* 32.3*  NA 137 136  K 4.3 4.1  CL 102 102  CO2 24 25  BUN 29* 23*  CREATININE 0.75 0.69    Microbiology: 7/29 blood cx MRSA 7/27 blood cx MRSA Methicillin resistant staphylococcus aureus      MIC    CIPROFLOXACIN <=0.5 SENSI... Sensitive    CLINDAMYCIN <=0.25 SENS... Sensitive    ERYTHROMYCIN >=8 RESISTANT  Resistant    GENTAMICIN <=0.5 SENSI... Sensitive    Inducible Clindamycin NEGATIVE  Sensitive    OXACILLIN >=4 RESISTANT  Resistant    RIFAMPIN <=0.5 SENSI... Sensitive    TETRACYCLINE <=1 SENSITIVE  Sensitive    TRIMETH/SULFA <=10 SENSIT... Sensitive  VANCOMYCIN 2 SENSITIVE  Sensitive         Susceptibility Comments   Studies/Results: No results found.   Assessment/Plan: 29yo M native valve endocarditis with pulmonary septic emboli/cavitary pneumonia, heart failure  - continue with daptomycin plus doxycycline through next Wednesday, with consideration for oral abtx thereafter  Poor dentition = to undergo dental extraction next Tuesday  Tachycardia associated with heart failure = improved with med management with heart failure team.   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

## 2020-06-11 NOTE — Progress Notes (Signed)
PROGRESS NOTE    Clifford Clark  XBM:[id number] DOB: 1990/12/22 DOA: 04/28/2020 PCP: Patient, [name]    Brief Narrative:  Patient admitted to the hospital due to MRSA/MSSA bacteremia due to tricuspid valve endocarditis, complicated with septic emboli.  29 year old male with significant past medical history for intravenous drug abuse who was found down on the side of the road.  Patient was brought to the hospital where he was diagnosed with multiple septic emboli and cavitary lung lesions.  Blood cultures were positive for MRSA.  Patient developed shock, required vasopressors and mechanical ventilation. 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  Echocardiogram with ejection fraction 60%, positive PFO.  Patient was found to have bilateral wrist abscesses, due to MSSA, likely embolic, underwent incision and drainage by orthopedics July 13.  July 20 patient underwent debridement of right atrial mass and tricuspid valve vegetation.  Follow-up echocardiography showed a drop in ejection fraction from 60% down to 20%, then up to 35 to 40%  He was liberated from mechanical ventilation July 28.  Transferred to [facility] 05/20/2020.  Patient was evaluated by oral surgery for tooth extractions.    Assessment & Plan:   Principal Problem:   Endocarditis Active Problems:   Intravenous drug abuse (HCC)   Septic embolism (HCC)   Sepsis (HCC)   Thrombocytopenia (HCC)   Hypokalemia   Hyponatremia   MSSA bacteremia   Malnutrition of moderate degree   Threatening behavior   Hand abscess   Elevated BUN   Pressure injury of skin   Chest pain   Acute respiratory failure (HCC)   Acute systolic heart failure (HCC)   1. Acute tricuspid valve endocarditis (MRSA/MSSA), with septic embolization, lungs, kidneys, and upper limbs. / poor dentition.  Continue antibiotic therapy with daptomycin and doxycycline IV. Plan for dental extraction next week.   2.  Systolic heart failure/ sepsis related. Currently is compensated, continue medical therapy with carvedilol, entresto, spirnolactone and ivabradine.   3. Hypokalemia. Clinically resolved, patient is tolerating po well.   4. Moderate calorie protein malnutrition. Continue with nutritional supplementation.   5, Substance use disorder. Follow as outpatient, no clinical signs of withdrawals.   6. Normocytic anemia. Stable, continue follow up as outpatient.   Septic shock with respiratory failure (hypoxic-acute) has resolved.   Status is: Inpatient  Remains inpatient appropriate because:IV treatments appropriate due to intensity of illness or inability to take PO   Dispo:  Patient From: Home  Planned Disposition: Behavioral Health  Expected discharge date: 06/13/20  Medically stable for discharge: No   DVT prophylaxis: Enoxaparin (patient has been refusing)   Code Status:   full  Family Communication:  No family at the bedside      Nutrition Status: Nutrition Problem: Moderate Malnutrition Etiology: social / environmental circumstances Signs/Symptoms: energy intake < 75% for > or equal to 3 months, moderate fat depletion, moderate muscle depletion, percent weight loss Percent weight loss: 24 % Interventions: MVI, Other (Comment) (Ensure Max)     Consultants:   ID  Cardiothoracic surgery   Cardiology   Pulmonary  Orthopedics  Will general surgery   Procedures:   Angio vac on 05/10/2020  Antimicrobials:   daptomycin and doxycycline.    Subjective: Patient with no nausea or vomiting, no chest pain or dyspnea, no diarrhea.   Objective: Vitals:   06/10/20 2052 06/11/20 0130 06/11/20 0610 06/11/20 1309  BP: (!) 95/59 95/61 (!) 89/51 (!) 97/59  Pulse: 90 81 82 86  Resp:  20 20 20 16   Temp: 97.6 F (36.4 C) 98.5 F (36.9 C) 98.3 F (36.8 C) 97.6 F (36.4 C)  TempSrc: Oral Oral Oral Oral  SpO2: 100% 100% 100% 99%  Weight:  59.9 kg    Height:         Intake/Output Summary (Last 24 hours) at 06/11/2020 1354 Last data filed at 06/11/2020 0900 Gross per 24 hour  Intake 490 ml  Output --  Net 490 ml   Filed Weights   06/09/20 0012 06/10/20 0612 06/11/20 0130  Weight: 59.8 kg 58.6 kg 59.9 kg    Examination:   General: Not in pain or dyspnea, deconditioned  Neurology: Awake and alert, non focal  E ENT: no pallor, no icterus, oral mucosa moist Cardiovascular: No JVD. S1-S2 present, rhythmic, no gallops, rubs, or murmurs. No lower extremity edema. Pulmonary: positive breath sounds bilaterally,  Gastrointestinal. Abdomen soft  Skin. No rashes Musculoskeletal: no joint deformities     Data Reviewed: I have personally reviewed following labs and imaging studies  CBC: Recent Labs  Lab 06/08/20 1706 06/09/20 0935  WBC 9.1 8.7  HGB 9.7* 10.1*  HCT 31.5* 32.3*  MCV 91.8 92.3  PLT 268 280   Basic Metabolic Panel: Recent Labs  Lab 06/08/20 1706 06/09/20 0935  NA 137 136  K 4.3 4.1  CL 102 102  CO2 24 25  GLUCOSE 109* 99  BUN 29* 23*  CREATININE 0.75 0.69  CALCIUM 9.4 9.3  MG  --  2.0  PHOS  --  4.5   GFR: Estimated Creatinine Clearance: 114.4 mL/min (by C-G formula based on SCr of 0.69 mg/dL). Liver Function Tests: No results for input(s): AST, ALT, ALKPHOS, BILITOT, PROT, ALBUMIN in the last 168 hours. No results for input(s): LIPASE, AMYLASE in the last 168 hours. No results for input(s): AMMONIA in the last 168 hours. Coagulation Profile: No results for input(s): INR, PROTIME in the last 168 hours. Cardiac Enzymes: Recent Labs  Lab 06/08/20 1706  CKTOTAL 17*   BNP (last 3 results) No results for input(s): PROBNP in the last 8760 hours. HbA1C: No results for input(s): HGBA1C in the last 72 hours. CBG: No results for input(s): GLUCAP in the last 168 hours. Lipid Profile: No results for input(s): CHOL, HDL, LDLCALC, TRIG, CHOLHDL, LDLDIRECT in the last 72 hours. Thyroid Function Tests: No results  for input(s): TSH, T4TOTAL, FREET4, T3FREE, THYROIDAB in the last 72 hours. Anemia Panel: No results for input(s): VITAMINB12, FOLATE, FERRITIN, TIBC, IRON, RETICCTPCT in the last 72 hours.    Radiology Studies: I have reviewed all of the imaging during this hospital visit personally     Scheduled Meds: . carvedilol  6.25 mg Oral BID WC  . chlorhexidine  15 mL Mouth/Throat BID  . docusate sodium  100 mg Oral BID  . doxycycline  100 mg Oral Q12H  . enoxaparin (LOVENOX) injection  40 mg Subcutaneous Q24H  . feeding supplement (ENSURE ENLIVE)  237 mL Oral TID BM  . ivabradine  5 mg Oral BID WC  . multivitamin with minerals  1 tablet Oral Daily  . pantoprazole  40 mg Oral Daily  . polyethylene glycol  17 g Oral Daily  . sacubitril-valsartan  1 tablet Oral BID  . sodium chloride flush  10-40 mL Intracatheter Q12H  . spironolactone  12.5 mg Oral Daily   Continuous Infusions: . sodium chloride Stopped (06/08/20 2209)  . DAPTOmycin (CUBICIN)  IV 500 mg (06/10/20 2126)     LOS: 44  days        Sargent Mankey Gerome Apley, MD

## 2020-06-11 NOTE — Plan of Care (Signed)
  Problem: Clinical Measurements: Goal: Cardiovascular complication will be avoided Outcome: Progressing   Problem: Activity: Goal: Risk for activity intolerance will decrease Outcome: Progressing   Problem: Nutrition: Goal: Adequate nutrition will be maintained Outcome: Progressing   

## 2020-06-11 NOTE — Plan of Care (Signed)
  Problem: Education: Goal: Knowledge of General Education information will improve Description: Including pain rating scale, medication(s)/side effects and non-pharmacologic comfort measures Outcome: Progressing   Problem: Health Behavior/Discharge Planning: Goal: Ability to manage health-related needs will improve Outcome: Progressing   Problem: Clinical Measurements: Goal: Ability to maintain clinical measurements within normal limits will improve Outcome: Progressing Goal: Will remain free from infection Outcome: Progressing Goal: Diagnostic test results will improve Outcome: Progressing Goal: Respiratory complications will improve Outcome: Progressing Goal: Cardiovascular complication will be avoided Outcome: Progressing   Problem: Activity: Goal: Risk for activity intolerance will decrease Outcome: Progressing   Problem: Nutrition: Goal: Adequate nutrition will be maintained Outcome: Progressing   Problem: Coping: Goal: Level of anxiety will decrease Outcome: Progressing   Problem: Elimination: Goal: Will not experience complications related to bowel motility Outcome: Progressing Goal: Will not experience complications related to urinary retention Outcome: Progressing   Problem: Pain Managment: Goal: General experience of comfort will improve Outcome: Progressing   Problem: Safety: Goal: Ability to remain free from injury will improve Outcome: Progressing   Problem: Education: Goal: Ability to demonstrate management of disease process will improve Outcome: Progressing Goal: Ability to verbalize understanding of medication therapies will improve Outcome: Progressing   Problem: Activity: Goal: Capacity to carry out activities will improve Outcome: Progressing   Problem: Cardiac: Goal: Ability to achieve and maintain adequate cardiopulmonary perfusion will improve Outcome: Progressing   

## 2020-06-12 MED ORDER — ALPRAZOLAM 0.5 MG PO TABS
1.0000 mg | ORAL_TABLET | Freq: Three times a day (TID) | ORAL | Status: DC | PRN
  Administered 2020-06-12 – 2020-06-15 (×6): 1 mg via ORAL
  Filled 2020-06-12 (×6): qty 2

## 2020-06-12 NOTE — Progress Notes (Signed)
PROGRESS NOTE    Clifford Clark  OJJ:[id number] DOB: 11-25-1990 DOA: 04/28/2020 PCP: Patient, [name]    Brief Narrative:  Patient admitted to the hospital due to MRSA/MSSA bacteremia due to tricuspid valve endocarditis, complicated with septic emboli.  29 year old male with significant past medical history for intravenous drug abuse who was found down on the side of the road.  Patient was brought to the hospital where he was diagnosed with multiple septic emboli and cavitary lung lesions.  Blood cultures were positive for MRSA.  Patient developed shock, required vasopressors and mechanical ventilation. 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  Echocardiogram with ejection fraction 60%, positive PFO.  Patient was found to have bilateral wrist abscesses, due to MSSA, likely embolic, underwent incision and drainage by orthopedics July 13.  July 20 patient underwent debridement of right atrial mass and tricuspid valve vegetation.  Follow-up echocardiography showed a drop in ejection fraction from 60% down to 20%, then up to 35 to 40%  He was liberated from mechanical ventilation July 28.  Transferred to [facility] 05/20/2020.  Patient was evaluated by oral surgery for tooth extractions.    Assessment & Plan:   Principal Problem:   Endocarditis Active Problems:   Intravenous drug abuse (HCC)   Septic embolism (HCC)   Sepsis (HCC)   Thrombocytopenia (HCC)   Hypokalemia   Hyponatremia   MSSA bacteremia   Malnutrition of moderate degree   Threatening behavior   Hand abscess   Elevated BUN   Pressure injury of skin   Chest pain   Acute respiratory failure (HCC)   Acute systolic heart failure (HCC)    1. Acute tricuspid valve endocarditis (MRSA/MSSA), with septic embolization, lungs, kidneys, and upper limbs. / poor dentition.  On antibiotic therapy with daptomycin and doxycycline IV. Plan for dental extraction next week.    Plan to transition to oral antibiotic therapy possibly on 08/25.   2. Systolic heart failure/ sepsis related. Continue to be compensated, tolerating well medical therapy with carvedilol, entresto, spirnolactone and ivabradine.   p 3. Hypokalemia. Clinically resolved.   4. Moderate calorie protein malnutrition. On nutritional supplementation.   5, Substance use disorder. Continue with no clinical signs of withdrawals.   6. Normocytic anemia. Stable, continue follow up as outpatient.   Septic shock with respiratory failure (hypoxic-acute) has resolved.   Status is: Inpatient  Remains inpatient appropriate because:IV treatments appropriate due to intensity of illness or inability to take PO     Status is: Inpatient  Remains inpatient appropriate because:IV treatments appropriate due to intensity of illness or inability to take PO   Dispo:  Patient From: Home  Planned Disposition: Behavioral Health  Expected discharge date: 06/15/20  Medically stable for discharge: No    DVT prophylaxis: Enoxaparin   Code Status:   full  Family Communication:  I spoke with patient's mother at the bedside, we talked in detail about patient's condition, plan of care and prognosis and all questions were addressed.      Nutrition Status: Nutrition Problem: Moderate Malnutrition Etiology: social / environmental circumstances Signs/Symptoms: energy intake < 75% for > or equal to 3 months, moderate fat depletion, moderate muscle depletion, percent weight loss Percent weight loss: 24 % Interventions: MVI, Other (Comment) (Ensure Max)     Consultants:   ID  Cardiothoracic surgery   Cardiology   Pulmonary  Orthopedics  Will general surgery   Procedures:  Angio vac on 05/10/2020  Antimicrobials:   daptomycin and  doxycycline.   Subjective: Patient is feeling well, no nausea or vomiting, no chest pain or dyspnea,   Objective: Vitals:   06/11/20 2159  06/12/20 0506 06/12/20 1027 06/12/20 1130  BP: (!) 104/56 (!) 88/57 94/60 (!) 98/59  Pulse: 87 83 91 90  Resp: 16 16  18   Temp: 97.7 F (36.5 C) 98.5 F (36.9 C)  98 F (36.7 C)  TempSrc: Oral Oral  Oral  SpO2: 100% 100%  100%  Weight:  59.2 kg    Height:        Intake/Output Summary (Last 24 hours) at 06/12/2020 1348 Last data filed at 06/12/2020 1300 Gross per 24 hour  Intake 1300 ml  Output --  Net 1300 ml   Filed Weights   06/10/20 0612 06/11/20 0130 06/12/20 0506  Weight: 58.6 kg 59.9 kg 59.2 kg    Examination:   General: Not in pain or dyspnea, deconditioned  Neurology: Awake and alert, non focal  E ENT: mild pallor, no icterus, oral mucosa moist Cardiovascular: No JVD. S1-S2 present, rhythmic, no gallops, rubs, or murmurs. trace lower extremity edema. Pulmonary: positive breath sounds bilaterally, adequate air movement, no wheezing, rhonchi or rales. Gastrointestinal. Abdomen soft and non tender Skin. No rashes Musculoskeletal: no joint deformities     Data Reviewed: I have personally reviewed following labs and imaging studies  CBC: Recent Labs  Lab 06/08/20 1706 06/09/20 0935  WBC 9.1 8.7  HGB 9.7* 10.1*  HCT 31.5* 32.3*  MCV 91.8 92.3  PLT 268 280   Basic Metabolic Panel: Recent Labs  Lab 06/08/20 1706 06/09/20 0935  NA 137 136  K 4.3 4.1  CL 102 102  CO2 24 25  GLUCOSE 109* 99  BUN 29* 23*  CREATININE 0.75 0.69  CALCIUM 9.4 9.3  MG  --  2.0  PHOS  --  4.5   GFR: Estimated Creatinine Clearance: 113.1 mL/min (by C-G formula based on SCr of 0.69 mg/dL). Liver Function Tests: No results for input(s): AST, ALT, ALKPHOS, BILITOT, PROT, ALBUMIN in the last 168 hours. No results for input(s): LIPASE, AMYLASE in the last 168 hours. No results for input(s): AMMONIA in the last 168 hours. Coagulation Profile: No results for input(s): INR, PROTIME in the last 168 hours. Cardiac Enzymes: Recent Labs  Lab 06/08/20 1706  CKTOTAL 17*   BNP  (last 3 results) No results for input(s): PROBNP in the last 8760 hours. HbA1C: No results for input(s): HGBA1C in the last 72 hours. CBG: No results for input(s): GLUCAP in the last 168 hours. Lipid Profile: No results for input(s): CHOL, HDL, LDLCALC, TRIG, CHOLHDL, LDLDIRECT in the last 72 hours. Thyroid Function Tests: No results for input(s): TSH, T4TOTAL, FREET4, T3FREE, THYROIDAB in the last 72 hours. Anemia Panel: No results for input(s): VITAMINB12, FOLATE, FERRITIN, TIBC, IRON, RETICCTPCT in the last 72 hours.    Radiology Studies: I have reviewed all of the imaging during this hospital visit personally     Scheduled Meds: . carvedilol  6.25 mg Oral BID WC  . chlorhexidine  15 mL Mouth/Throat BID  . docusate sodium  100 mg Oral BID  . doxycycline  100 mg Oral Q12H  . enoxaparin (LOVENOX) injection  40 mg Subcutaneous Q24H  . feeding supplement (ENSURE ENLIVE)  237 mL Oral TID BM  . ivabradine  5 mg Oral BID WC  . multivitamin with minerals  1 tablet Oral Daily  . pantoprazole  40 mg Oral Daily  . polyethylene glycol  17 g  Oral Daily  . sacubitril-valsartan  1 tablet Oral BID  . sodium chloride flush  10-40 mL Intracatheter Q12H  . spironolactone  12.5 mg Oral Daily   Continuous Infusions: . sodium chloride Stopped (06/08/20 2209)  . DAPTOmycin (CUBICIN)  IV 500 mg (06/11/20 2218)     LOS: 45 days        Emile Ringgenberg Annett Gula, MD

## 2020-06-13 LAB — BASIC METABOLIC PANEL
Anion gap: 12 (ref 5–15)
BUN: 27 mg/dL — ABNORMAL HIGH (ref 6–20)
CO2: 25 mmol/L (ref 22–32)
Calcium: 10.2 mg/dL (ref 8.9–10.3)
Chloride: 101 mmol/L (ref 98–111)
Creatinine, Ser: 0.68 mg/dL (ref 0.61–1.24)
GFR calc Af Amer: >60 mL/min (ref >60)
GFR calc non Af Amer: >60 mL/min (ref >60)
Glucose, Bld: 82 mg/dL (ref 70–99)
Potassium: 4.4 mmol/L (ref 3.5–5.1)
Sodium: 138 mmol/L (ref 135–145)

## 2020-06-13 MED ORDER — SPIRONOLACTONE 25 MG PO TABS
25.0000 mg | ORAL_TABLET | Freq: Every day | ORAL | Status: DC
  Administered 2020-06-14 – 2020-06-17 (×4): 25 mg via ORAL
  Filled 2020-06-13 (×4): qty 1

## 2020-06-13 MED ORDER — POTASSIUM CHLORIDE CRYS ER 20 MEQ PO TBCR
40.0000 meq | EXTENDED_RELEASE_TABLET | Freq: Once | ORAL | Status: AC
  Administered 2020-06-13: 40 meq via ORAL
  Filled 2020-06-13: qty 2

## 2020-06-13 MED ORDER — FUROSEMIDE 10 MG/ML IJ SOLN
20.0000 mg | Freq: Once | INTRAMUSCULAR | Status: AC
  Administered 2020-06-13: 20 mg via INTRAVENOUS
  Filled 2020-06-13: qty 2

## 2020-06-13 MED ORDER — IVABRADINE HCL 7.5 MG PO TABS
7.5000 mg | ORAL_TABLET | Freq: Two times a day (BID) | ORAL | Status: DC
  Administered 2020-06-13 – 2020-06-17 (×8): 7.5 mg via ORAL
  Filled 2020-06-13 (×9): qty 1

## 2020-06-13 NOTE — Progress Notes (Signed)
[facility]  Date of Admission:  04/28/2020     Total days of antibiotics 46         ASSESSMENT:  Clifford Clark has continued to progress well. Awaiting dental extraction of 2 teeth scheduled for tomorrow. Discussed plan of care to include continued daptomycin and doxycycline through Wednesday and transition to doxycycline. CK levels have remained stable through treatment. Will continue current dose of daptomycin and doxycycline.   PLAN:  1. Continue current dose of daptomycin and doxycycline.  2. Therapeutic drug monitoring of CK levels while on daptomycin. 3. Dental extraction planned for tomorrow.  4. Possible conversion to doxycycline on Wednesday prior to discharge.    Principal Problem:   Endocarditis Active Problems:   Intravenous drug abuse (HCC)   Septic embolism (HCC)   Sepsis (HCC)   Thrombocytopenia (HCC)   Hypokalemia   Hyponatremia   MSSA bacteremia   Malnutrition of moderate degree   Threatening behavior   Hand abscess   Elevated BUN   Pressure injury of skin   Chest pain   Acute respiratory failure (HCC)   Acute systolic heart failure (HCC)   . carvedilol  6.25 mg Oral BID WC  . chlorhexidine  15 mL Mouth/Throat BID  . docusate sodium  100 mg Oral BID  . doxycycline  100 mg Oral Q12H  . feeding supplement (ENSURE ENLIVE)  237 mL Oral TID BM  . ivabradine  7.5 mg Oral BID WC  . multivitamin with minerals  1 tablet Oral Daily  . pantoprazole  40 mg Oral Daily  . polyethylene glycol  17 g Oral Daily  . sacubitril-valsartan  1 tablet Oral BID  . sodium chloride flush  10-40 mL Intracatheter Q12H  . [START ON 06/14/2020] spironolactone  25 mg Oral Daily    SUBJECTIVE:  Afebrile overnight with no acute events. Has been up and walking including stairs. Getting ready to go to rehab. Planned dental extraction tomorrow.   No Known Allergies   Review of Systems: Review of Systems  Constitutional: Negative for chills, fever  and weight loss.  Respiratory: Negative for cough, shortness of breath and wheezing.   Cardiovascular: Negative for chest pain and leg swelling.  Gastrointestinal: Negative for abdominal pain, constipation, diarrhea, nausea and vomiting.  Skin: Negative for rash.      OBJECTIVE: Vitals:   06/13/20 0911 06/13/20 0929 06/13/20 1118 06/13/20 1258  BP: 100/63 123/75 98/60 102/62  Pulse: (!) 105 (!) 116 (!) 106 (!) 101  Resp:   17 16  Temp:   97.8 F (36.6 C) (!) 97.4 F (36.3 C)  TempSrc:   Oral Oral  SpO2:  100% 99% 100%  Weight:      Height:       Body mass index is 20.72 kg/m.  Physical Exam Constitutional:      General: He is not in acute distress.    Appearance: He is well-developed.  Cardiovascular:     Rate and Rhythm: Normal rate and regular rhythm.     Heart sounds: Normal heart sounds.  Pulmonary:     Effort: Pulmonary effort is normal.     Breath sounds: Normal breath sounds.  Skin:    General: Skin is warm and dry.  Neurological:     Mental Status: He is alert and oriented to person, place, and time.  Psychiatric:        Behavior: Behavior normal.        Thought Content: Thought content  normal.        Judgment: Judgment normal.     Lab Results Lab Results  Component Value Date   WBC 8.7 06/09/2020   HGB 10.1 (L) 06/09/2020   HCT 32.3 (L) 06/09/2020   MCV 92.3 06/09/2020   PLT 280 06/09/2020    Lab Results  Component Value Date   CREATININE 0.69 06/09/2020   BUN 23 (H) 06/09/2020   NA 136 06/09/2020   K 4.1 06/09/2020   CL 102 06/09/2020   CO2 25 06/09/2020    Lab Results  Component Value Date   ALT 114 (H) 05/25/2020   AST 84 (H) 05/25/2020   ALKPHOS 95 05/25/2020   BILITOT 0.4 05/25/2020     Microbiology: No results found for this or any previous visit (from the past 240 hour(s)).   Marcos Eke, NP [facility]  06/13/2020  1:13 PM

## 2020-06-13 NOTE — Anesthesia Preprocedure Evaluation (Addendum)
Anesthesia Evaluation  Patient identified by MRN, date of birth, ID band Patient awake    Reviewed: Allergy & Precautions, H&P , NPO status , Patient's Chart, lab work & pertinent test results  Airway Mallampati: I  TM Distance: >3 FB Neck ROM: Full    Dental no notable dental hx. (+) Teeth Intact, Loose, Chipped, Missing, Poor Dentition   Pulmonary neg pulmonary ROS, asthma , former smoker,    Pulmonary exam normal breath sounds clear to auscultation       Cardiovascular Exercise Tolerance: Good negative cardio ROS Normal cardiovascular exam+ Valvular Problems/Murmurs  Rhythm:Regular Rate:Normal  TEE 7/16 Large mobile vegetation of the tricuspid valve with associated severe TR. Probable small vegetation of the pulmonic valve with trivial PR. No evidence of left-sided endocarditis. No LAA thrombus LVEF 60-65% Small to moderate-sized circumferential pericardial effusion.   Neuro/Psych negative neurological ROS  negative psych ROS   GI/Hepatic negative GI ROS, Neg liver ROS, (+)     substance abuse  ,   Endo/Other  negative endocrine ROS  Renal/GU Renal diseasenegative Renal ROS  negative genitourinary   Musculoskeletal negative musculoskeletal ROS (+) narcotic dependent  Abdominal   Peds negative pediatric ROS (+)  Hematology negative hematology ROS (+)   Anesthesia Other Findings   Reproductive/Obstetrics negative OB ROS                            Anesthesia Physical Anesthesia Plan  ASA: III  Anesthesia Plan: General   Post-op Pain Management:    Induction: Intravenous  PONV Risk Score and Plan: 2 and Ondansetron, Dexamethasone and Treatment may vary due to age or medical condition  Airway Management Planned: LMA and Oral ETT  Additional Equipment:   Intra-op Plan:   Post-operative Plan: Extubation in OR  Informed Consent: I have reviewed the patients History and  Physical, chart, labs and discussed the procedure including the risks, benefits and alternatives for the proposed anesthesia with the patient or authorized representative who has indicated his/her understanding and acceptance.       Plan Discussed with: Anesthesiologist and CRNA  Anesthesia Plan Comments: (  )       Anesthesia Quick Evaluation

## 2020-06-13 NOTE — Progress Notes (Signed)
PROGRESS NOTE    Clifford Clark  KPT:[id number]  DOB: 19-Nov-1990  DOA: 04/28/2020 PCP: Patient, [name] Outpatient Specialists:   Hospital course:  29 year old man was admitted 04/28/2020 with tricuspid MSSA/MRSA endocarditis secondary to IV drug use with acute respiratory failure secondary to bilateral septic emboli.  He was initially septic requiring pressors and mechanical ventilation however he has responded well to treatment and he was extubated on July 28.  Patient was also noted to have bilateral wrist abscesses thought to be embolic.  Patient underwent debridement of right atrial mass and tricuspid vegetation on July 20.  This has been complicated by drop in his ejection fraction to 20% down from 60% however it is subsequently rebounded to 35 to 45%.  Patient continues treatment with daptomycin and IV doxycycline and plan is for dental extraction next week.   Subjective:  Patient is doing well.  His only concern is communicating with [facility] to make sure he has the ability to go to drug treatment after discharge.  No chest pain or shortness of breath.  No joint pain.  No fevers or chills.   Objective: Vitals:   06/13/20 0929 06/13/20 1118 06/13/20 1258 06/13/20 1645  BP: 123/75 98/60 102/62 101/69  Pulse: (!) 116 (!) 106 (!) 101 (!) 104  Resp:  17 16   Temp:  97.8 F (36.6 C) (!) 97.4 F (36.3 C)   TempSrc:  Oral Oral   SpO2: 100% 99% 100%   Weight:      Height:        Intake/Output Summary (Last 24 hours) at 06/13/2020 1734 Last data filed at 06/13/2020 1710 Gross per 24 hour  Intake 1487 ml  Output 627 ml  Net 860 ml   Filed Weights   06/11/20 0130 06/12/20 0506 06/13/20 0543  Weight: 59.9 kg 59.2 kg 60 kg     Exam:  General: Thin chronically ill-appearing patient sitting up in recliner in no acute distress. Eyes: sclera anicteric, conjuctiva mild injection bilaterally CVS: S1-S2, regular  Respiratory:  decreased air entry bilaterally secondary to  decreased inspiratory effort, rales at bases  GI: NABS, soft, NT  LE: No edema.  Neuro: A/O x 3, Moving all extremities equally with normal strength, CN 3-12 intact, grossly nonfocal.  Psych: patient is logical and coherent, judgement and insight appear normal, mood and affect appropriate to situation.   Assessment & Plan:   Acute tricuspid valve endocarditis secondary to MRSA and MSSA with septic embolization to the lungs kidneys and upper limbs. Continue treatment with daptomycin and IV doxycycline Very much appreciate ongoing ID consultation Plan to vision oral antibiotics possibly next Wednesday. Patient is for dental extraction tomorrow.  HFrEF Presently compensated, continue treatment with carvedilol, Entresto, spironolactone and ivabradine Appreciate ongoing cardiology consultation with  Substance abuse disorder Speak to [facility] to make sure he has somewhere to go after discharge   DVT prophylaxis: Enoxaparin Code Status: Full Family Communication: None at bedside, states he is in contact with them Disposition Plan:   Patient is from: Home  Anticipated Discharge Location: [facility]  Barriers to Discharge: Ongoing IV antibiotics  Is patient medically stable for Discharge: Not yet   Consultants:  ID  Cardiothoracic surgery   Cardiology  Pulmonary  Orthopedics  Will general surgery   Procedures: Angio vac on 05/10/2020  Antimicrobials:  daptomycin and doxycycline.  Data Reviewed:  Basic Metabolic Panel: Recent Labs  Lab 06/08/20 1706 06/09/20 0935 06/13/20 1215  NA 137 136 138  K 4.3  4.1 4.4  CL 102 102 101  CO2 24 25 25   GLUCOSE 109* 99 82  BUN 29* 23* 27*  CREATININE 0.75 0.69 0.68  CALCIUM 9.4 9.3 10.2  MG  --  2.0  --   PHOS  --  4.5  --    Liver Function Tests: No results for input(s): AST, ALT, ALKPHOS, BILITOT, PROT, ALBUMIN in the last 168 hours. No results for input(s): LIPASE, AMYLASE in the last 168 hours. No results  for input(s): AMMONIA in the last 168 hours. CBC: Recent Labs  Lab 06/08/20 1706 06/09/20 0935  WBC 9.1 8.7  HGB 9.7* 10.1*  HCT 31.5* 32.3*  MCV 91.8 92.3  PLT 268 280   Cardiac Enzymes: Recent Labs  Lab 06/08/20 1706  CKTOTAL 17*   BNP (last 3 results) No results for input(s): PROBNP in the last 8760 hours. CBG: No results for input(s): GLUCAP in the last 168 hours.  No results found for this or any previous visit (from the past 240 hour(s)).    Studies: No results found.   Scheduled Meds: . carvedilol  6.25 mg Oral BID WC  . chlorhexidine  15 mL Mouth/Throat BID  . docusate sodium  100 mg Oral BID  . doxycycline  100 mg Oral Q12H  . feeding supplement (ENSURE ENLIVE)  237 mL Oral TID BM  . ivabradine  7.5 mg Oral BID WC  . multivitamin with minerals  1 tablet Oral Daily  . pantoprazole  40 mg Oral Daily  . polyethylene glycol  17 g Oral Daily  . sacubitril-valsartan  1 tablet Oral BID  . sodium chloride flush  10-40 mL Intracatheter Q12H  . [START ON 06/14/2020] spironolactone  25 mg Oral Daily   Continuous Infusions: . sodium chloride Stopped (06/08/20 2209)  . DAPTOmycin (CUBICIN)  IV 500 mg (06/12/20 2059)    Principal Problem:   Endocarditis Active Problems:   Intravenous drug abuse (HCC)   Septic embolism (HCC)   Sepsis (HCC)   Thrombocytopenia (HCC)   Hypokalemia   Hyponatremia   MSSA bacteremia   Malnutrition of moderate degree   Threatening behavior   Hand abscess   Elevated BUN   Pressure injury of skin   Chest pain   Acute respiratory failure (HCC)   Acute systolic heart failure (HCC)     Clifford Clark, [facility]  If 7PM-7AM, please contact night-coverage [email or web address] Password [facility] 06/13/2020, 5:34 PM    LOS: 46 days

## 2020-06-13 NOTE — Progress Notes (Addendum)
Advanced Heart Failure Rounding Note  PCP-Cardiologist: No primary care provider on file.    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 ER w/u showed multiple septic emboli to lungs with cavitary lesions. 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  Also found to have emboli to kidneys and hands. TEE with EF 60-65% small PFO. large TV vegetation with severe TR. No vegetations on Aov/MV despite left-sided emboli.  Underwent AngioJet removal of part of vegetation. Then developed MRSA sepsis and respiratory failure. Intubated and started on pressors. F/u echo LVEF 20-25%. RV normal.  hstrop 192 -> 190.     Subjective:    Sitting up in chair. Feels better. No complaints. Denies dyspnea.   Continues on abx w/ daptomycin + doxycyline. AF   Last lab draw was 8/19. SCr and K were both stable. Sinus tach low 100s on tele. Daily wts have been trending up.   Scheduled for dental extractions tomorrow w/ Dr. Kristin Bruins.   Objective:   Weight Range: 60 kg Body mass index is 20.72 kg/m.   Vital Signs:   Temp:  [97.7 F (36.5 C)-98 F (36.7 C)] 97.8 F (36.6 C) (08/23 0908) Pulse Rate:  [87-116] 116 (08/23 0929) Resp:  [18] 18 (08/23 0543) BP: (91-123)/(57-75) 123/75 (08/23 0929) SpO2:  [100 %] 100 % (08/23 0929) Weight:  [60 kg] 60 kg (08/23 0543) Last BM Date: 06/12/20  Weight change: Filed Weights   06/11/20 0130 06/12/20 0506 06/13/20 0543  Weight: 59.9 kg 59.2 kg 60 kg    Intake/Output:   Intake/Output Summary (Last 24 hours) at 06/13/2020 1047 Last data filed at 06/13/2020 0855 Gross per 24 hour  Intake 1187 ml  Output 625 ml  Net 562 ml      Physical Exam    PHYSICAL EXAM: General:  Well appearing/ thin young male. No respiratory difficulty HEENT: normal Neck: supple. no JVD. Carotids 2+ bilat; no bruits. No lymphadenopathy or thyromegaly appreciated. Cor: PMI nondisplaced. Regular rhythm, mildly tachy rate.  2/6 TR murmur  Lungs: clear Abdomen: soft, nontender, nondistended. No hepatosplenomegaly. No bruits or masses. Good bowel sounds. Extremities: no cyanosis, clubbing, rash, edema Neuro: alert & oriented x 3, cranial nerves grossly intact. moves all 4 extremities w/o difficulty. Affect pleasant.   Telemetry   Sinus tach, low 100s.   Labs    CBC No results for input(s): WBC, NEUTROABS, HGB, HCT, MCV, PLT in the last 72 hours. Basic Metabolic Panel No results for input(s): NA, K, CL, CO2, GLUCOSE, BUN, CREATININE, CALCIUM, MG, PHOS in the last 72 hours. Liver Function Tests No results for input(s): AST, ALT, ALKPHOS, BILITOT, PROT, ALBUMIN in the last 72 hours. No results for input(s): LIPASE, AMYLASE in the last 72 hours. Cardiac Enzymes No results for input(s): CKTOTAL, CKMB, CKMBINDEX, TROPONINI in the last 72 hours.  BNP: BNP (last 3 results) No results for input(s): BNP in the last 8760 hours.  ProBNP (last 3 results) No results for input(s): PROBNP in the last 8760 hours.   D-Dimer No results for input(s): DDIMER in the last 72 hours. Hemoglobin A1C No results for input(s): HGBA1C in the last 72 hours. Fasting Lipid Panel No results for input(s): CHOL, HDL, LDLCALC, TRIG, CHOLHDL, LDLDIRECT in the last 72 hours. Thyroid Function Tests No results for input(s): TSH, T4TOTAL, T3FREE, THYROIDAB in the last 72 hours.  Invalid input(s): FREET3  Other results:  Imaging    No results found.   Medications:     Scheduled Medications: . carvedilol  6.25 mg Oral BID WC  . chlorhexidine  15 mL Mouth/Throat BID  . docusate sodium  100 mg Oral BID  . doxycycline  100 mg Oral Q12H  . enoxaparin (LOVENOX) injection  40 mg Subcutaneous Q24H  . feeding supplement (ENSURE ENLIVE)  237 mL Oral TID BM  . ivabradine  5 mg Oral BID WC  . multivitamin with minerals  1 tablet Oral Daily  . pantoprazole  40 mg Oral Daily  . polyethylene glycol  17 g Oral Daily  .  sacubitril-valsartan  1 tablet Oral BID  . sodium chloride flush  10-40 mL Intracatheter Q12H  . spironolactone  12.5 mg Oral Daily    Infusions: . sodium chloride Stopped (06/08/20 2209)  . 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 There was also amall flickering leaflet mass, suggestive of vegetation of the pulmonic valve. No mitral or aortic valve pathology. - s/p Angiovac debulking on 7/20 but still with large vegetation  - abx per ID. Now off vancomycin, now on daptomycin + doxy  -  Will eventually need TV repair if he is a candidate.  - Panorex confirmed multiple cavities. Scheduled for dental extractions 8/24  2. Acute Hypoxic Respiratory Failure - Resolved. Extubated 7/28 . Sats stable on room air.   3. Septic Shock  - 2/2 MRSA Bacteremia - resolved  - continue abx per ID as above   4. Acute Systolic Heart Failure - initial Echo 7/9 w/ normal LVEF 50-55%. RV normal - TEE 7/16, LVEF normal 60-65%, RV normal - Limited Echo 7/24, LVEF severely reduced 20-25%, RV systolic function hyperdynamic. MV w/ mild-mod MR but no seen vegetation  - Hs trop 192 -> 190. Trend flat, most c/w demand ischemia and not ACS - suspect acute drop in EF stress induced from septic shock. Doubt CAD - Repeat limited echo 7/29 w/ interval improvement, LVEF up to 30-35%. Given ongoing tachycardia ECHO was repeated and showed EF 35-40% with on large vegetation, RV mildly reduced.  - daily wts starting to trend up but no dyspnea, Sinus tach low 100s on tele - Increase Spiro to 25 mg  daily. Check BMP today  - Further titrate corlanor to 7.5 mg bid  - Continue coreg 6.25 mg twice a day. Hold titration for now to limit fatigue    -Continue Entresto 24-26 mg bid. Will gradually titrate as BP allows    5. Anemia - transfuse for hgb <7.0  - hgb ok last blood draw, 10.1     Length of Stay: [address], PA-C  06/13/2020, 10:47 AM  Advanced Heart Failure Team Pager [phone] (M-F; 7a - 4p)  Please contact [facility] for night-coverage after hours (4p -7a ) and weekends on [email or web address] Robbie Lis, PA-C  10:47 AM    Patient seen and examined with the above-signed Advanced Practice Provider and/or Housestaff. I personally reviewed laboratory data, imaging studies and relevant notes. I independently examined the patient and formulated the important aspects of the plan. I have  edited the note to reflect any of my changes or salient points. I have personally discussed the plan with the patient and/or family.  Doing well. Ambulating halls. No SOB, orthopnea or PND.   HR back up over 100. For dental extractions tomorrow  General:  Well appearing. No resp difficulty HEENT: normal Neck: supple. JVP 10 + prominent CV waves Carotids 2+ bilat; no bruits. No lymphadenopathy or thryomegaly appreciated. Cor: PMI nondisplaced. Regular tachy  2/6 TR Lungs: clear Abdomen: soft, nontender, nondistended. No hepatosplenomegaly. No bruits or masses. Good bowel sounds. Extremities: no cyanosis, clubbing, rash, edema Neuro: alert & orientedx3, cranial nerves grossly intact. moves all 4 extremities w/o difficulty. Affect pleasant  Agree with increasing corlanor and spiro. Will give one dose IV lasix. Ok for surgery tomorrow.   Arvilla Meres, MD  12:41 PM

## 2020-06-14 ENCOUNTER — Telehealth (HOSPITAL_COMMUNITY): Payer: Self-pay | Admitting: Pharmacist

## 2020-06-14 ENCOUNTER — Inpatient Hospital Stay (HOSPITAL_COMMUNITY): Payer: Self-pay

## 2020-06-14 ENCOUNTER — Encounter (HOSPITAL_COMMUNITY)
Admission: EM | Disposition: A | Discharge status: Home / Self Care | Payer: Self-pay | Source: Home / Self Care | Attending: Internal Medicine

## 2020-06-14 ENCOUNTER — Encounter (HOSPITAL_COMMUNITY): Payer: Self-pay | Admitting: Internal Medicine

## 2020-06-14 DIAGNOSIS — K083 Retained dental root: Secondary | ICD-10-CM

## 2020-06-14 DIAGNOSIS — K036 Deposits [accretions] on teeth: Secondary | ICD-10-CM

## 2020-06-14 DIAGNOSIS — K0530 Chronic periodontitis, unspecified: Secondary | ICD-10-CM

## 2020-06-14 DIAGNOSIS — K045 Chronic apical periodontitis: Secondary | ICD-10-CM

## 2020-06-14 DIAGNOSIS — K029 Dental caries, unspecified: Secondary | ICD-10-CM

## 2020-06-14 HISTORY — PX: MULTIPLE EXTRACTIONS WITH ALVEOLOPLASTY: SHX5342

## 2020-06-14 LAB — SURGICAL PCR SCREEN
MRSA, PCR: NEGATIVE
Staphylococcus aureus: NEGATIVE

## 2020-06-14 LAB — BASIC METABOLIC PANEL
Anion gap: 10 (ref 5–15)
BUN: 27 mg/dL — ABNORMAL HIGH (ref 6–20)
CO2: 24 mmol/L (ref 22–32)
Calcium: 9.7 mg/dL (ref 8.9–10.3)
Chloride: 101 mmol/L (ref 98–111)
Creatinine, Ser: 0.89 mg/dL (ref 0.61–1.24)
GFR calc Af Amer: >60 mL/min (ref >60)
GFR calc non Af Amer: >60 mL/min (ref >60)
Glucose, Bld: 266 mg/dL — ABNORMAL HIGH (ref 70–99)
Potassium: 4.8 mmol/L (ref 3.5–5.1)
Sodium: 135 mmol/L (ref 135–145)

## 2020-06-14 SURGERY — MULTIPLE EXTRACTION WITH ALVEOLOPLASTY
Anesthesia: General

## 2020-06-14 MED ORDER — CEFAZOLIN SODIUM-DEXTROSE 2-3 GM-%(50ML) IV SOLR
INTRAVENOUS | Status: DC | PRN
  Administered 2020-06-14: 2 g via INTRAVENOUS

## 2020-06-14 MED ORDER — OXYCODONE HCL 5 MG PO TABS
ORAL_TABLET | ORAL | Status: AC
  Filled 2020-06-14: qty 1

## 2020-06-14 MED ORDER — AMINOCAPROIC ACID SOLUTION 5% (50 MG/ML)
10.0000 mL | ORAL | Status: AC
  Administered 2020-06-14 (×7): 10 mL via ORAL
  Filled 2020-06-14: qty 100

## 2020-06-14 MED ORDER — BUPIVACAINE-EPINEPHRINE (PF) 0.5% -1:200000 IJ SOLN
INTRAMUSCULAR | Status: AC
  Filled 2020-06-14: qty 3.6

## 2020-06-14 MED ORDER — ROCURONIUM BROMIDE 10 MG/ML (PF) SYRINGE
PREFILLED_SYRINGE | INTRAVENOUS | Status: DC | PRN
  Administered 2020-06-14: 40 mg via INTRAVENOUS
  Administered 2020-06-14: 60 mg via INTRAVENOUS

## 2020-06-14 MED ORDER — OXYCODONE HCL 5 MG/5ML PO SOLN: 5.0000 mg | Freq: Once | ORAL | Status: AC | PRN

## 2020-06-14 MED ORDER — LIDOCAINE-EPINEPHRINE 2 %-1:100000 IJ SOLN
INTRAMUSCULAR | Status: AC
  Filled 2020-06-14: qty 10.2

## 2020-06-14 MED ORDER — THROMBIN (RECOMBINANT) 5000 UNITS EX SOLR
CUTANEOUS | Status: AC
  Filled 2020-06-14: qty 5000

## 2020-06-14 MED ORDER — DEXAMETHASONE SODIUM PHOSPHATE 10 MG/ML IJ SOLN
INTRAMUSCULAR | Status: AC
  Filled 2020-06-14: qty 1

## 2020-06-14 MED ORDER — OXYMETAZOLINE HCL 0.05 % NA SOLN
NASAL | Status: AC
  Filled 2020-06-14: qty 30

## 2020-06-14 MED ORDER — ONDANSETRON HCL 4 MG/2ML IJ SOLN
4.0000 mg | Freq: Once | INTRAMUSCULAR | Status: AC | PRN
  Administered 2020-06-14: 4 mg via INTRAVENOUS

## 2020-06-14 MED ORDER — PROPOFOL 10 MG/ML IV BOLUS
INTRAVENOUS | Status: DC | PRN
  Administered 2020-06-14: 150 mg via INTRAVENOUS

## 2020-06-14 MED ORDER — OXYMETAZOLINE HCL 0.05 % NA SOLN
NASAL | Status: DC | PRN
  Administered 2020-06-14: 2 via NASAL

## 2020-06-14 MED ORDER — PROPOFOL 10 MG/ML IV BOLUS
INTRAVENOUS | Status: AC
  Filled 2020-06-14: qty 20

## 2020-06-14 MED ORDER — ACETAMINOPHEN 160 MG/5ML PO SOLN: 325.0000 mg | ORAL | Status: DC | PRN

## 2020-06-14 MED ORDER — LIDOCAINE-EPINEPHRINE 2 %-1:100000 IJ SOLN
INTRAMUSCULAR | Status: DC | PRN
  Administered 2020-06-14 (×2): 1.7 mL via INTRADERMAL

## 2020-06-14 MED ORDER — CHLORHEXIDINE GLUCONATE 0.12 % MT SOLN
15.0000 mL | Freq: Once | OROMUCOSAL | Status: AC
  Administered 2020-06-14: 15 mL via OROMUCOSAL

## 2020-06-14 MED ORDER — BOOST / RESOURCE BREEZE PO LIQD CUSTOM
1.0000 | Freq: Three times a day (TID) | ORAL | Status: DC
  Administered 2020-06-14: 1 via ORAL

## 2020-06-14 MED ORDER — KETAMINE HCL 10 MG/ML IJ SOLN
INTRAMUSCULAR | Status: DC | PRN
  Administered 2020-06-14: 30 mg via INTRAVENOUS

## 2020-06-14 MED ORDER — LACTATED RINGERS IV SOLN: INTRAVENOUS | Status: DC

## 2020-06-14 MED ORDER — SUGAMMADEX SODIUM 200 MG/2ML IV SOLN
INTRAVENOUS | Status: DC | PRN
  Administered 2020-06-14: 200 mg via INTRAVENOUS

## 2020-06-14 MED ORDER — DEXAMETHASONE SODIUM PHOSPHATE 10 MG/ML IJ SOLN
INTRAMUSCULAR | Status: DC | PRN
  Administered 2020-06-14: 10 mg via INTRAVENOUS

## 2020-06-14 MED ORDER — MIDAZOLAM HCL 5 MG/5ML IJ SOLN
INTRAMUSCULAR | Status: DC | PRN
  Administered 2020-06-14: 2 mg via INTRAVENOUS

## 2020-06-14 MED ORDER — MIDAZOLAM HCL 2 MG/2ML IJ SOLN
INTRAMUSCULAR | Status: AC
  Filled 2020-06-14: qty 2

## 2020-06-14 MED ORDER — ACETAMINOPHEN 325 MG PO TABS: 325.0000 mg | ORAL_TABLET | ORAL | Status: DC | PRN

## 2020-06-14 MED ORDER — ENSURE ENLIVE PO LIQD: 237.0000 mL | Freq: Two times a day (BID) | ORAL | Status: DC

## 2020-06-14 MED ORDER — FENTANYL CITRATE (PF) 100 MCG/2ML IJ SOLN
INTRAMUSCULAR | Status: AC
  Filled 2020-06-14: qty 2

## 2020-06-14 MED ORDER — 0.9 % SODIUM CHLORIDE (POUR BTL) OPTIME
TOPICAL | Status: DC | PRN
  Administered 2020-06-14: 1000 mL

## 2020-06-14 MED ORDER — FENTANYL CITRATE (PF) 250 MCG/5ML IJ SOLN
INTRAMUSCULAR | Status: AC
  Filled 2020-06-14: qty 5

## 2020-06-14 MED ORDER — LIDOCAINE 2% (20 MG/ML) 5 ML SYRINGE
INTRAMUSCULAR | Status: AC
  Filled 2020-06-14: qty 5

## 2020-06-14 MED ORDER — OXYCODONE HCL 5 MG PO TABS
5.0000 mg | ORAL_TABLET | Freq: Once | ORAL | Status: AC | PRN
  Administered 2020-06-14: 5 mg via ORAL

## 2020-06-14 MED ORDER — FENTANYL CITRATE (PF) 100 MCG/2ML IJ SOLN
25.0000 ug | INTRAMUSCULAR | Status: DC | PRN
  Administered 2020-06-14 (×2): 50 ug via INTRAVENOUS

## 2020-06-14 MED ORDER — DOXYCYCLINE HYCLATE 100 MG PO TABS: 100.0000 mg | ORAL_TABLET | Freq: Two times a day (BID) | ORAL | 0 refills | Status: AC

## 2020-06-14 MED ORDER — LIDOCAINE 2% (20 MG/ML) 5 ML SYRINGE
INTRAMUSCULAR | Status: DC | PRN
  Administered 2020-06-14: 60 mg via INTRAVENOUS

## 2020-06-14 MED ORDER — FENTANYL CITRATE (PF) 250 MCG/5ML IJ SOLN
INTRAMUSCULAR | Status: DC | PRN
Start: 2020-06-14 — End: 2020-06-14
  Administered 2020-06-14: 25 ug via INTRAVENOUS
  Administered 2020-06-14: 50 ug via INTRAVENOUS
  Administered 2020-06-14: 25 ug via INTRAVENOUS

## 2020-06-14 MED ORDER — ENSURE ENLIVE PO LIQD
237.0000 mL | Freq: Three times a day (TID) | ORAL | Status: DC
  Administered 2020-06-15 – 2020-06-17 (×7): 237 mL via ORAL

## 2020-06-14 MED ORDER — BUPIVACAINE-EPINEPHRINE 0.5% -1:200000 IJ SOLN
INTRAMUSCULAR | Status: DC | PRN
  Administered 2020-06-14 (×2): 1.7 mL

## 2020-06-14 MED ORDER — ROCURONIUM BROMIDE 10 MG/ML (PF) SYRINGE
PREFILLED_SYRINGE | INTRAVENOUS | Status: AC
  Filled 2020-06-14: qty 10

## 2020-06-14 MED ORDER — MEPERIDINE HCL 25 MG/ML IJ SOLN: 6.2500 mg | INTRAMUSCULAR | Status: DC | PRN

## 2020-06-14 MED ORDER — THROMBIN 5000 UNITS EX SOLR
CUTANEOUS | Status: AC
  Filled 2020-06-14: qty 5000

## 2020-06-14 MED ORDER — EPHEDRINE SULFATE 50 MG/ML IJ SOLN
INTRAMUSCULAR | Status: DC | PRN
  Administered 2020-06-14: 10 mg via INTRAVENOUS
  Administered 2020-06-14: 5 mg via INTRAVENOUS

## 2020-06-14 MED ORDER — PHENYLEPHRINE HCL-NACL 10-0.9 MG/250ML-% IV SOLN
INTRAVENOUS | Status: AC
  Filled 2020-06-14: qty 250

## 2020-06-14 MED ORDER — KETAMINE HCL 50 MG/5ML IJ SOSY
PREFILLED_SYRINGE | INTRAMUSCULAR | Status: AC
  Filled 2020-06-14: qty 5

## 2020-06-14 MED ORDER — ONDANSETRON HCL 4 MG/2ML IJ SOLN
INTRAMUSCULAR | Status: AC
  Filled 2020-06-14: qty 2

## 2020-06-14 MED ORDER — PHENYLEPHRINE HCL-NACL 10-0.9 MG/250ML-% IV SOLN
INTRAVENOUS | Status: DC | PRN
  Administered 2020-06-14: 25 ug/min via INTRAVENOUS

## 2020-06-14 MED FILL — DOXYCYCLINE HYCLATE 100 MG: 100 | 21 days supply | Qty: 42 | Fill #0

## 2020-06-14 SURGICAL SUPPLY — 33 items
ALCOHOL 70% 16 OZ (MISCELLANEOUS) ×2 IMPLANT
ATTRACTOMAT 16X20 MAGNETIC DRP (DRAPES) ×2 IMPLANT
BLADE SURG 15 STRL LF DISP TIS (BLADE) ×2 IMPLANT
BLADE SURG 15 STRL SS (BLADE) ×2
COVER SURGICAL LIGHT HANDLE (MISCELLANEOUS) ×2 IMPLANT
COVER WAND RF STERILE (DRAPES) ×2 IMPLANT
GAUZE 4X4 16PLY RFD (DISPOSABLE) ×2 IMPLANT
GAUZE PACKING FOLDED 2  STR (GAUZE/BANDAGES/DRESSINGS) ×1
GAUZE PACKING FOLDED 2 STR (GAUZE/BANDAGES/DRESSINGS) ×1 IMPLANT
GLOVE BIO SURGEON STRL SZ 6.5 (GLOVE) ×2 IMPLANT
GLOVE SURG ORTHO 8.0 STRL STRW (GLOVE) ×2 IMPLANT
GOWN STRL REUS W/ TWL LRG LVL3 (GOWN DISPOSABLE) ×1 IMPLANT
GOWN STRL REUS W/TWL 2XL LVL3 (GOWN DISPOSABLE) ×2 IMPLANT
GOWN STRL REUS W/TWL LRG LVL3 (GOWN DISPOSABLE) ×1
KIT BASIN OR (CUSTOM PROCEDURE TRAY) ×2 IMPLANT
KIT TURNOVER KIT B (KITS) ×2 IMPLANT
MANIFOLD NEPTUNE II (INSTRUMENTS) ×2 IMPLANT
NEEDLE BLUNT 16X1.5 OR ONLY (NEEDLE) ×2 IMPLANT
NEEDLE DENTAL 27 LONG (NEEDLE) ×2 IMPLANT
NS IRRIG 1000ML POUR BTL (IV SOLUTION) ×2 IMPLANT
PACK EENT II TURBAN DRAPE (CUSTOM PROCEDURE TRAY) ×2 IMPLANT
PAD ARMBOARD 7.5X6 YLW CONV (MISCELLANEOUS) ×2 IMPLANT
SPONGE SURGIFOAM ABS GEL SZ50 (HEMOSTASIS) ×2 IMPLANT
SUCTION FRAZIER HANDLE 10FR (MISCELLANEOUS) ×1
SUCTION TUBE FRAZIER 10FR DISP (MISCELLANEOUS) ×1 IMPLANT
SUT CHROMIC 3 0 PS 2 (SUTURE) ×2 IMPLANT
SUT CHROMIC 4 0 P 3 18 (SUTURE) ×2 IMPLANT
SYR 50ML SLIP (SYRINGE) ×2 IMPLANT
TOWEL GREEN STERILE (TOWEL DISPOSABLE) ×2 IMPLANT
TUBE CONNECTING 12X1/4 (SUCTIONS) ×2 IMPLANT
WATER STERILE IRR 1000ML POUR (IV SOLUTION) ×2 IMPLANT
WATER TABLETS ICX (MISCELLANEOUS) ×2 IMPLANT
YANKAUER SUCT BULB TIP NO VENT (SUCTIONS) ×2 IMPLANT

## 2020-06-14 NOTE — Discharge Instructions (Signed)

## 2020-06-14 NOTE — Progress Notes (Signed)
[facility]    Date of Admission:  04/28/2020     ID: Clifford Clark is a 29 y.o. male with  Principal Problem:   Endocarditis Active Problems:   Intravenous drug abuse (HCC)   Septic embolism (HCC)   Sepsis (HCC)   Thrombocytopenia (HCC)   Hypokalemia   Hyponatremia   MSSA bacteremia   Malnutrition of moderate degree   Threatening behavior   Hand abscess   Elevated BUN   Pressure injury of skin   Chest pain   Acute respiratory failure (HCC)   Acute systolic heart failure (HCC)    Subjective: Underwent 2 teeth extractions today without much difficulty  Medications:  . aminocaproic acid  10 mL Oral Q1H  . carvedilol  6.25 mg Oral BID WC  . chlorhexidine  15 mL Mouth/Throat BID  . docusate sodium  100 mg Oral BID  . doxycycline  100 mg Oral Q12H  . feeding supplement (ENSURE ENLIVE)  237 mL Oral TID BM  . fentaNYL      . ivabradine  7.5 mg Oral BID WC  . multivitamin with minerals  1 tablet Oral Daily  . oxyCODONE      . pantoprazole  40 mg Oral Daily  . polyethylene glycol  17 g Oral Daily  . sacubitril-valsartan  1 tablet Oral BID  . sodium chloride flush  10-40 mL Intracatheter Q12H  . spironolactone  25 mg Oral Daily    Objective: Vital signs in last 24 hours: Temp:  [97.9 F (36.6 C)-98.5 F (36.9 C)] 97.9 F (36.6 C) (08/24 1136) Pulse Rate:  [86-97] 89 (08/24 1205) Resp:  [18-21] 21 (08/24 1136) BP: (90-121)/(51-80) 121/73 (08/24 1205) SpO2:  [95 %-100 %] 98 % (08/24 1205) Weight:  [60.2 kg] 60.2 kg (08/24 0557)  Constitutional: He is oriented to person, place, and time. He appears well-developed and well-nourished. No distress.  HENT:  Mouth/Throat: Oropharynx is clear and moist. No oropharyngeal exudate.  Cardiovascular: Normal rate, regular rhythm and normal heart sounds.+ murmur No murmur heard.  Pulmonary/Chest: Effort normal and breath sounds normal. No respiratory distress. He has no wheezes.  Abdominal: Soft.  Bowel sounds are normal. He exhibits no distension. There is no tenderness.  Lymphadenopathy:  He has no cervical adenopathy.  Neurological: He is alert and oriented to person, place, and time.  Skin: Skin is warm and dry. No rash noted. No erythema.  Psychiatric: He has a normal mood and affect. His behavior is normal.    Lab Results Recent Labs    06/13/20 1215 06/14/20 1436  NA 138 135  K 4.4 4.8  CL 101 101  CO2 25 24  BUN 27* 27*  CREATININE 0.68 0.89    Microbiology: 7/29- blood cx MRSA Studies/Results: No results found.   Assessment/Plan: Complicated bacteremia with MRSA, TV endocarditis, pulmonary infection = continues on daptomycin plus doxycycline. Doing well with treatment.  History of illicit drug use, rehab = he is hoping to go to [facility] 30 day residential program after he gets discharged. The condition of the rehab program is that he can do e-visits for cardiology, ID followup but unable to go to inperson clinic visits. Need to discuss with cardiology if that is possible so that can write a letter of medical clearance for the patient  Teeth extraction = continue with checking cbc tomorrow to see he has not lost significant blood for extraction  Tachycardia = improved with current regimen.    Judyann Munson  [facility] Cell: [phone] Pager: [phone]  06/14/2020, 6:07 PM

## 2020-06-14 NOTE — Progress Notes (Signed)
PROGRESS NOTE    Clifford Clark  YQM:[id number]  DOB: 1991/07/31  DOA: 04/28/2020 PCP: Patient, [name] Outpatient Specialists:   Hospital course:  29 year old man was admitted 04/28/2020 with tricuspid MSSA/MRSA endocarditis secondary to IV drug use with acute respiratory failure secondary to bilateral septic emboli.  He was initially septic requiring pressors and mechanical ventilation however he has responded well to treatment and he was extubated on July 28.  Patient was also noted to have bilateral wrist abscesses thought to be embolic.  Patient underwent debridement of right atrial mass and tricuspid vegetation on July 20.  This has been complicated by drop in his ejection fraction to 20% down from 60% however it is subsequently rebounded to 35 to 45%.  Patient continues treatment with daptomycin and IV doxycycline and plan is for dental extraction next week.   Subjective:  Patient without complaints today.  Notes that he tolerated his teeth extraction without difficulty earlier today.  Still concerned about getting medical clearance from [facility].  I told him that I called [facility] but they did not seem to know what they needed from me.  Left a message with nurse Ruff to call me back.  Objective: Vitals:   06/14/20 1121 06/14/20 1130 06/14/20 1136 06/14/20 1205  BP: 106/80  105/70 121/73  Pulse: 92 89 86 89  Resp: (!) 21 18 (!) 21   Temp:   97.9 F (36.6 C)   TempSrc:      SpO2: 95% 97% 96% 98%  Weight:      Height:        Intake/Output Summary (Last 24 hours) at 06/14/2020 1819 Last data filed at 06/14/2020 1458 Gross per 24 hour  Intake 1330 ml  Output 700 ml  Net 630 ml   Filed Weights   06/12/20 0506 06/13/20 0543 06/14/20 0557  Weight: 59.2 kg 60 kg 60.2 kg     Exam:  General: Thin chronically ill-appearing patient chatting with mother at bedside. eyes: sclera anicteric, conjuctiva mild injection bilaterally CVS: S1-S2, regular  Respiratory:  decreased air  entry bilaterally secondary to decreased inspiratory effort, rales at bases  GI: NABS, soft, NT  LE: No edema.  Neuro: A/O x 3, Moving all extremities equally with normal strength, CN 3-12 intact, grossly nonfocal.  Psych: patient is logical and coherent, judgement and insight appear normal, mood and affect appropriate to situation.   Assessment & Plan:   Acute tricuspid valve endocarditis secondary to MRSA and MSSA with septic embolization to the lungs kidneys and upper limbs. Continue treatment with daptomycin and IV doxycycline Very much appreciate ongoing ID consultation Plan to vision oral antibiotics possibly next Wednesday. Patient is for dental extraction tomorrow.  Substance abuse disorder As noted above, left a message with RN Tomie China to call me back with any questions she may have about patient's ability to participate in treatment from a medical standpoint.  HFrEF Presently compensated, continue treatment with carvedilol, Entresto, spironolactone and ivabradine Appreciate ongoing cardiology consultation with   DVT prophylaxis: Enoxaparin Code Status: Full Family Communication: None at bedside, states he is in contact with them Disposition Plan:   Patient is from: Home  Anticipated Discharge Location: [facility]  Barriers to Discharge: Ongoing IV antibiotics  Is patient medically stable for Discharge: Not yet   Consultants:  ID  Cardiothoracic surgery   Cardiology  Pulmonary  Orthopedics  Will general surgery   Procedures: Angio vac on 05/10/2020  Antimicrobials:  daptomycin and doxycycline.  Data Reviewed:  Basic  Metabolic Panel: Recent Labs  Lab 06/08/20 1706 06/09/20 0935 06/13/20 1215 06/14/20 1436  NA 137 136 138 135  K 4.3 4.1 4.4 4.8  CL 102 102 101 101  CO2 24 25 25 24   GLUCOSE 109* 99 82 266*  BUN 29* 23* 27* 27*  CREATININE 0.75 0.69 0.68 0.89  CALCIUM 9.4 9.3 10.2 9.7  MG  --  2.0  --   --   PHOS  --  4.5  --   --     Liver Function Tests: No results for input(s): AST, ALT, ALKPHOS, BILITOT, PROT, ALBUMIN in the last 168 hours. No results for input(s): LIPASE, AMYLASE in the last 168 hours. No results for input(s): AMMONIA in the last 168 hours. CBC: Recent Labs  Lab 06/08/20 1706 06/09/20 0935  WBC 9.1 8.7  HGB 9.7* 10.1*  HCT 31.5* 32.3*  MCV 91.8 92.3  PLT 268 280   Cardiac Enzymes: Recent Labs  Lab 06/08/20 1706  CKTOTAL 17*   BNP (last 3 results) No results for input(s): PROBNP in the last 8760 hours. CBG: No results for input(s): GLUCAP in the last 168 hours.  Recent Results (from the past 240 hour(s))  Surgical pcr screen     Status: None   Collection Time: 06/14/20  5:39 AM   Specimen: Nasal Mucosa; Nasal Swab  Result Value Ref Range Status   MRSA, PCR NEGATIVE NEGATIVE Final   Staphylococcus aureus NEGATIVE NEGATIVE Final    Comment: (NOTE) The Xpert SA Assay (FDA approved for NASAL specimens in patients 51 years of age and older), is one component of a comprehensive surveillance program. It is not intended to diagnose infection nor to guide or monitor treatment. Performed at [facility], [address], [place], [place]       Studies: No results found.   Scheduled Meds: . aminocaproic acid  10 mL Oral Q1H  . carvedilol  6.25 mg Oral BID WC  . chlorhexidine  15 mL Mouth/Throat BID  . docusate sodium  100 mg Oral BID  . doxycycline  100 mg Oral Q12H  . feeding supplement (ENSURE ENLIVE)  237 mL Oral TID BM  . fentaNYL      . ivabradine  7.5 mg Oral BID WC  . multivitamin with minerals  1 tablet Oral Daily  . oxyCODONE      . pantoprazole  40 mg Oral Daily  . polyethylene glycol  17 g Oral Daily  . sacubitril-valsartan  1 tablet Oral BID  . sodium chloride flush  10-40 mL Intracatheter Q12H  . spironolactone  25 mg Oral Daily   Continuous Infusions: . sodium chloride Stopped (06/08/20 2209)  . DAPTOmycin (CUBICIN)  IV 500 mg (06/13/20  2057)  . lactated ringers      Principal Problem:   Endocarditis Active Problems:   Intravenous drug abuse (HCC)   Septic embolism (HCC)   Sepsis (HCC)   Thrombocytopenia (HCC)   Hypokalemia   Hyponatremia   MSSA bacteremia   Malnutrition of moderate degree   Threatening behavior   Hand abscess   Elevated BUN   Pressure injury of skin   Chest pain   Acute respiratory failure (HCC)   Acute systolic heart failure (HCC)     Clifford Clark Tublu Rasheida Broden, [facility]  If 7PM-7AM, please contact night-coverage [email or web address] Password [facility] 06/14/2020, 6:19 PM    LOS: 47 days

## 2020-06-14 NOTE — Anesthesia Procedure Notes (Addendum)
Procedure Name: Intubation Date/Time: 06/14/2020 9:50 AM Performed by: Milford Cage, CRNA Pre-anesthesia Checklist: Patient identified, Emergency Drugs available, Suction available and Patient being monitored Patient Re-evaluated:Patient Re-evaluated prior to induction Oxygen Delivery Method: Circle System Utilized Preoxygenation: Pre-oxygenation with 100% oxygen Induction Type: IV induction Ventilation: Mask ventilation without difficulty Laryngoscope Size: 3 and Mac Grade View: Grade I Nasal Tubes: Nasal Rae, Nasal prep performed and Magill forceps- large, utilized Tube size: 7.0 mm Number of attempts: 1 Placement Confirmation: ETT inserted through vocal cords under direct vision,  positive ETCO2 and breath sounds checked- equal and bilateral Secured at: 27 cm Tube secured with: Tape Dental Injury: Teeth and Oropharynx as per pre-operative assessment

## 2020-06-14 NOTE — Progress Notes (Signed)
PRE-OPERATIVE NOTE:  06/14/2020 Dayvon Dax [phone]  VITALS: BP 96/62 (BP Location: Right Arm)   Pulse 88   Temp 98 F (36.7 C) (Oral)   Resp 18   Ht 5' 7.01" (1.702 m)   Wt 60.2 kg Comment: scale b  SpO2 100%   BMI 20.79 kg/m   Lab Results  Component Value Date   WBC 8.7 06/09/2020   HGB 10.1 (L) 06/09/2020   HCT 32.3 (L) 06/09/2020   MCV 92.3 06/09/2020   PLT 280 06/09/2020   BMET    Component Value Date/Time   NA 138 06/13/2020 1215   K 4.4 06/13/2020 1215   CL 101 06/13/2020 1215   CO2 25 06/13/2020 1215   GLUCOSE 82 06/13/2020 1215   BUN 27 (H) 06/13/2020 1215   CREATININE 0.68 06/13/2020 1215   CALCIUM 10.2 06/13/2020 1215   GFRNONAA >60 06/13/2020 1215   GFRAA >60 06/13/2020 1215    Lab Results  Component Value Date   INR 1.4 (H) 05/15/2020   INR 1.4 (H) 04/28/2020   No results found for: PTT   Damione Jun presents for multiple dental extractions with alveoloplasty and gross to being remaining dentition in the operating with general anesthesia.    SUBJECTIVE: The patient denies any acute medical or dental changes and agrees to proceed with treatment as planned.  EXAM: No sign of acute dental changes.  ASSESSMENT: Patient is affected by chronic apical periodontitis, retained root segments, dental caries, chronic periodontitis, and accretions.  PLAN: Patient agrees to proceed with treatment as planned in the operating room as previously discussed and accepts the risks, benefits, and complications of the proposed treatment.  Again discussed extraction of teeth with extensive dental caries that currently are not symptomatic nor do they have periapical pathology.  Patient again adamantly refused dental extractions of these teeth at this time.  Patient wishes only to proceed with extraction of tooth numbers 3 and 28 . Patient is aware of the risk for bleeding, bruising, swelling, infection, pain, nerve damage, soft tissue damage, damage to adjacent  teeth, sinus involvement, root tip fracture, mandible fracture, and the risks of complications associated with the anesthesia. Patient also is aware of the potential for other complications up to and including death to his overall cardiovascular compromise.   [name], DDS

## 2020-06-14 NOTE — Progress Notes (Signed)
PT Cancellation Note  Patient Details Name: Clifford Clark MRN: [id number] DOB: [birth year]   Cancelled Treatment:    Reason Eval/Treat Not Completed: Patient at procedure or test/unavailable (Pt getting dental extractions today. Will return tomorrow. )   [name] [name] 06/14/2020, 8:44 AM [name],PT Acute Rehabilitation Services Pager:  [phone]  Office:  [phone]

## 2020-06-14 NOTE — Progress Notes (Addendum)
Advanced Heart Failure Rounding Note  PCP-Cardiologist: No primary care provider on file.    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 ER w/u showed multiple septic emboli to lungs with cavitary lesions. 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  Also found to have emboli to kidneys and hands. TEE with EF 60-65% small PFO. large TV vegetation with severe TR. No vegetations on Aov/MV despite left-sided emboli.  Underwent AngioJet removal of part of vegetation. Then developed MRSA sepsis and respiratory failure. Intubated and started on pressors. F/u echo LVEF 20-25%. RV normal.  hstrop 192 -> 190.     Subjective:    Yesterday corlanor was increaesed and diuresed with IV lasix.   Denies SOB.    Objective:   Weight Range: 60.2 kg Body mass index is 20.8 kg/m.   Vital Signs:   Temp:  [97.4 F (36.3 C)-98.5 F (36.9 C)] 98 F (36.7 C) (08/24 0557) Pulse Rate:  [88-116] 88 (08/24 0807) Resp:  [16-18] 18 (08/24 0807) BP: (90-123)/(51-75) 96/62 (08/24 0807) SpO2:  [98 %-100 %] 100 % (08/24 0807) Weight:  [60.2 kg] 60.2 kg (08/24 0557) Last BM Date: 06/13/20  Weight change: Filed Weights   06/12/20 0506 06/13/20 0543 06/14/20 0557  Weight: 59.2 kg 60 kg 60.2 kg    Intake/Output:   Intake/Output Summary (Last 24 hours) at 06/14/2020 0810 Last data filed at 06/14/2020 0600 Gross per 24 hour  Intake 1250 ml  Output 652 ml  Net 598 ml      Physical Exam    PHYSICAL EXAM: General:  Well appearing. No resp difficulty HEENT: normal Neck: supple. no JVD. Carotids 2+ bilat; no bruits. No lymphadenopathy or thryomegaly appreciated. Cor: PMI nondisplaced. Regular rate & rhythm. No rubs, gallops .  2/6 TR murmur.  Lungs: clear Abdomen: soft, nontender, nondistended. No hepatosplenomegaly. No bruits or masses. Good bowel sounds. Extremities: no cyanosis, clubbing, rash, edema Neuro: alert & orientedx3, cranial  nerves grossly intact. moves all 4 extremities w/o difficulty. Affect pleasant   Telemetry  SR 80-90s  Labs    CBC No results for input(s): WBC, NEUTROABS, HGB, HCT, MCV, PLT in the last 72 hours. Basic Metabolic Panel Recent Labs    32/12/24 1215  NA 138  K 4.4  CL 101  CO2 25  GLUCOSE 82  BUN 27*  CREATININE 0.68  CALCIUM 10.2   Liver Function Tests No results for input(s): AST, ALT, ALKPHOS, BILITOT, PROT, ALBUMIN in the last 72 hours. No results for input(s): LIPASE, AMYLASE in the last 72 hours. Cardiac Enzymes No results for input(s): CKTOTAL, CKMB, CKMBINDEX, TROPONINI in the last 72 hours.  BNP: BNP (last 3 results) No results for input(s): BNP in the last 8760 hours.  ProBNP (last 3 results) No results for input(s): PROBNP in the last 8760 hours.   D-Dimer No results for input(s): DDIMER in the last 72 hours. Hemoglobin A1C No results for input(s): HGBA1C in the last 72 hours. Fasting Lipid Panel No results for input(s): CHOL, HDL, LDLCALC, TRIG, CHOLHDL, LDLDIRECT in the last 72 hours. Thyroid Function Tests No results for input(s): TSH, T4TOTAL, T3FREE, THYROIDAB in the last 72 hours.  Invalid input(s): FREET3  Other results:   Imaging    No results found.   Medications:     Scheduled Medications: . carvedilol  6.25 mg Oral BID WC  . chlorhexidine  15 mL Mouth/Throat  BID  . docusate sodium  100 mg Oral BID  . doxycycline  100 mg Oral Q12H  . feeding supplement (ENSURE ENLIVE)  237 mL Oral TID BM  . ivabradine  7.5 mg Oral BID WC  . multivitamin with minerals  1 tablet Oral Daily  . pantoprazole  40 mg Oral Daily  . polyethylene glycol  17 g Oral Daily  . sacubitril-valsartan  1 tablet Oral BID  . sodium chloride flush  10-40 mL Intracatheter Q12H  . spironolactone  25 mg Oral Daily    Infusions: . sodium chloride Stopped (06/08/20 2209)  . 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 There was also amall flickering leaflet mass, suggestive of vegetation of the pulmonic valve. No mitral or aortic valve pathology. - s/p Angiovac debulking on 7/20 but still with large vegetation  - abx per ID. Now off vancomycin, now on daptomycin + doxy  -  Will eventually need TV repair if he is a candidate.  - Panorex confirmed multiple cavities. Scheduled for dental extractions today.   2. Acute Hypoxic Respiratory Failure - Resolved. Extubated 7/28 . Sats stable on room air.   3. Septic Shock  - 2/2 MRSA Bacteremia - resolved  - continue abx per ID as above   4. Acute Systolic Heart Failure - initial Echo 7/9 w/ normal LVEF 50-55%. RV normal - TEE 7/16, LVEF normal 60-65%, RV normal - Limited Echo 7/24, LVEF severely reduced 20-25%, RV systolic function hyperdynamic. MV w/ mild-mod MR but no seen vegetation  - Hs trop 192 -> 190. Trend flat, most c/w demand ischemia and not ACS - suspect acute drop in EF stress induced from septic shock. Doubt CAD - Repeat limited echo 7/29 w/ interval improvement, LVEF up to 30-35%. Given ongoing tachycardia ECHO was repeated and showed EF 35-40% with on large vegetation, RV mildly reduced.  - Volume status stable. Does not need IV diuretics.  - Continue Spiro to 25 mg daily.  - Continue corlanor to 7.5 mg bid . Check pharmacy about corlanor at d/c  - Continue coreg 6.25 mg twice a day. Hold titration for now to limit fatigue    -Continue Entresto 24-26 mg bid. NO room to titrate with soft. SBP.   5. Anemia - transfuse for hgb <7.0  - hgb ok last blood draw, 10.1    Check with pharmacy and TOC about corlanor. He is discharging to [facility] later this week and will need all meds at discharge.   Length of Stay: 68  Amy Filbert Schilder, NP  06/14/2020, 8:10 AM  Advanced Heart Failure Team Pager [phone] (M-F; 7a - 4p)  Please contact [facility] for night-coverage after hours (4p -7a ) and weekends on [email or web address] Tonye Becket, NP  8:10 AM    Patient seen and examined with the above-signed Advanced Practice Provider and/or Housestaff. I personally reviewed laboratory data, imaging studies and relevant notes. I independently examined the patient and formulated the important aspects of the plan. I have edited the note to reflect any of my changes or salient points. I have personally discussed the plan with the  patient and/or family.  Agree with above. Patient seen prior to dental extractions. HR and volume status improved.   General:  Sitting in chair No resp difficulty HEENT: normal Neck: supple. JVP 8-10 with prominent CV waves. Carotids 2+ bilat; no bruits. No lymphadenopathy or thryomegaly appreciated. Cor: PMI nondisplaced. 2/6 TR . Lungs: clear Abdomen: soft, nontender, nondistended. No hepatosplenomegaly. No bruits or masses. Good bowel sounds. Extremities: no cyanosis, clubbing, rash, edema Neuro: alert & orientedx3, cranial nerves grossly intact. moves all 4 extremities w/o difficulty. Affect pleasant  Continue current HF regimen. Appreciate Dr. Luretha Murphy assistance. We have reached out to Pharmacy to help with meds in preparation for d/c.   Arvilla Meres, MD  10:15 PM

## 2020-06-14 NOTE — Transfer of Care (Signed)
Immediate Anesthesia Transfer of Care Note  Patient: Clifford Clark  Procedure(s) Performed: Surgical extraction of tooth #'s 3 and 28 with alveoloplasty and gross debridement of remaining dentition. (N/A )  Patient Location: PACU  Anesthesia Type:General  Level of Consciousness: awake, alert  and patient cooperative  Airway & Oxygen Therapy: Patient Spontanous Breathing and Patient connected to face mask oxygen  Post-op Assessment: Report given to RN, Post -op Vital signs reviewed and stable and Patient moving all extremities  Post vital signs: Reviewed and stable  Last Vitals:  Vitals Value Taken Time  BP 111/71 06/14/20 1106  Temp 36.6 C 06/14/20 1106  Pulse 88 06/14/20 1109  Resp 24 06/14/20 1109  SpO2 100 % 06/14/20 1109  Vitals shown include unvalidated device data.  Last Pain:  Vitals:   06/14/20 0600  TempSrc:   PainSc: 0-No pain      Patients Stated Pain Goal: 0 (06/12/20 9518)  Complications: No complications documented.

## 2020-06-14 NOTE — Op Note (Signed)
OPERATIVE REPORT  Patient:            Clifford Clark Date of Birth:  06/08/1991 MRN:                [id number]   DATE OF PROCEDURE:  06/14/2020  PREOPERATIVE DIAGNOSES: 1.  Tricuspid valve endocarditis 2.  Preheart valve surgery dental protocol 3.  Chronic apical periodontitis  4.  Retained root segments 5.  Dental caries 6.  Chronic periodontitis 7.  Accretions  POSTOPERATIVE DIAGNOSES: 1.  Tricuspid valve endocarditis 2.  Preheart valve surgery dental protocol 3.  Chronic apical periodontitis  4.  Retained root segments 5.  Dental caries 6.  Chronic periodontitis 7.  Accretions  OPERATIONS: 1. Multiple extraction of tooth numbers 3 and 28 2. 2 Quadrants of alveoloplasty 3. Gross debridement of remaining dentition   SURGEON: [name], DDS  ASSISTANT: Pearletha Alfred (dental assistant)  ANESTHESIA: General anesthesia via nasoendotracheal tube.  MEDICATIONS: 1. Ancef 2 g IV prior to invasive dental procedures. 2. Local anesthesia with a total utilization of 2 carpules each containing 34 mg of lidocaine with 0.017 mg of epinephrine as well as 2 carpules each containing 9 mg of bupivacaine with 0.009 mg of epinephrine.  SPECIMENS: There are 2 teeth that were discarded.  DRAINS: None  CULTURES: None  COMPLICATIONS: None  ESTIMATED BLOOD LOSS: 50 mLs.  INTRAVENOUS FLUIDS: 500 mLs of Lactated ringers solution.  INDICATIONS: The patient was recently diagnosed with tricuspid valve endocarditis.  A medically necessary dental consultation was then requested to evaluate poor dentition.  The patient was examined and treatment planned for multiple extractions with alveoloplasty and gross debridement of remaining dentition in the operating room with general anesthesia.  This treatment plan was formulated to decrease the risks and complications associated with dental infection from affecting the patient's systemic health and the anticipated heart valve  surgery.  OPERATIVE FINDINGS: Patient was examined operating room number 8.  Tooth numbers 3 and 28 were identified for extraction.  Additional teeth with extensive dental caries were identified for extraction, however, the patient was adamant about not having additional extractions at this time.  The patient indicated he would follow-up with a primary dentist in [place], [place] for evaluation for dental restorations and follow-up periodontal therapy as indicated.  The patient will require antibiotic indication prior to invasive dental procedures due to history of endocarditis and anticipated heart valve surgery.  The patient, therefore, was noted to be affected by dental caries, chronic apical periodontitis, retained root segments, chronic periodontitis and accretions.    DESCRIPTION OF PROCEDURE: Patient was brought to the main operating room number 8. Patient was then placed in the supine position on the operating table. General anesthesia was then induced per the anesthesia team. The patient was then prepped and draped in the usual manner for dental medicine procedure. A timeout was performed. The patient was identified and procedures were verified. A throat pack was placed at this time. The oral cavity was then thoroughly examined with the findings noted above. The patient was then ready for dental medicine procedure as follows:  Local anesthesia was then administered sequentially with a total utilization of 2 carpules each containing 34 mg of lidocaine with 0.017 mg of epinephrine as well as 2 carpules  each containing 9 mg bupivacaine with 0.009 mg of epinephrine.  The Maxillary right quadrant was first approached. Anesthesia was then delivered utilizing infiltration with lidocaine with epinephrine. A #15 blade incision was then made from the  mesial of #2 and extended to the distal of #4.  A  surgical flap was then carefully reflected on the buccal and palatal aspects.  A surgical  handpiece and bur and copious amounts of sterile water was used to remove buccal and interseptal bone appropriately.  The retained root #3 were then elevated out with a series of Cryer's elevators without complication.  Sockets were curetted and compressed appropriately.  Alveoloplasty was then performed utilizing a rongeurs and bone file to help assist in obtaining primary closure.  The surgical site was irrigated with copious amounts sterile saline.  A piece of Surgifoam was placed in each extraction socket appropriately.  Surgical site was then closed from the mesial of #2 and extended the distal #4 utilizing 3-0 Chromic Gut suture in a continuous running suture technique x1.    At this point time, the mandibular quadrants were approached. The patient was given bilateral inferior alveolar nerve blocks and long buccal nerve blocks utilizing the bupivacaine with epinephrine. Further infiltration was then achieved utilizing the lidocaine with epinephrine in the area of #28.  A 15 blade incision was then made from the mesial of #29 extended the mesial #27 a surgical flap was then reflected carefully.  A surgical handpiece and bur copious amounts of sterile water were then used to remove buccal and interseptal bone around tooth #28.  Retained root #28 was then removed with a series of Cryer's elevators without complications.  Socket was then curetted and compressed appropriately.  Alveoloplasty was then performed utilizing rongeurs and bone file to help achieve primary closure.  Surgical site was then irrigated with copious amounts sterile saline.  A piece of Surgifoam was placed in the extraction socket appropriately.  Surgical site was then closed from the mesial of #29 extended to the distal of #27 utilizing 3-0 Chromic Gut suture in a continuous interrupted suture technique x1.    At this point in time, a gross debridement procedure was performed utilizing a sonic scaler.  A series of hand curettes were  utilized to further remove accretions.  The sonic scaler was then again used to further refine the removal of accretions.  This completed the gross debridement procedure.  At this time, after removal of the accretions, extensive dental caries were noted to be affecting the remaining dentition.  Most of the dental caries are on the facial aspect near the gumline.  At this point time, the entire mouth was irrigated with copious amounts of sterile saline. The patient was examined for complications, seeing none, the dental medicine procedure was deemed to be complete. The throat pack was removed at this time. An oral airway was then placed at the request of the anesthesia team. A series of 4 x 4 gauze moistened with Amicar 5% rinse were placed in the mouth to aid hemostasis. The patient was then handed over to the anesthesia team for final disposition. After an appropriate amount of time, the patient was extubated and taken to the postanesthsia care unit in good condition. All counts were correct for the dental medicine procedure.  The patient is to continue Amicar 5% rinses postoperatively.  Patient is to rinse with 10 mils every hour for the next 10 hours and a swish and spit manner.  The Lovenox therapy may be restarted by the medical team at their discretion.   [name], DDS.

## 2020-06-14 NOTE — Anesthesia Postprocedure Evaluation (Signed)
Anesthesia Post Note  Patient: Clifford Clark  Procedure(s) Performed: Surgical extraction of tooth #'s 3 and 28 with alveoloplasty and gross debridement of remaining dentition. (N/A )     Patient location during evaluation: PACU Anesthesia Type: General Level of consciousness: awake and alert Pain management: pain level controlled Vital Signs Assessment: post-procedure vital signs reviewed and stable Respiratory status: spontaneous breathing, nonlabored ventilation, respiratory function stable and patient connected to nasal cannula oxygen Cardiovascular status: blood pressure returned to baseline and stable Postop Assessment: no apparent nausea or vomiting Anesthetic complications: no   No complications documented.  Last Vitals:  Vitals:   06/14/20 1136 06/14/20 1205  BP: 105/70 121/73  Pulse: 86 89  Resp: (!) 21   Temp: 36.6 C   SpO2: 96% 98%    Last Pain:  Vitals:   06/14/20 1106  TempSrc:   PainSc: 7                  Sharae Zappulla

## 2020-06-14 NOTE — Progress Notes (Addendum)
Nutrition Follow-up  RD working remotely.  DOCUMENTATION CODES:   Non-severe (moderate) malnutrition in context of social or environmental circumstances  INTERVENTION:   -Continue MVI with minerals daily -Ensure Enlive poTID, each supplement provides 350 kcal and 20 grams of protein -Magic cup TID with meals, each supplement provides 290 kcal and 9 grams of protein  NUTRITION DIAGNOSIS:   Moderate Malnutrition related to social / environmental circumstances as evidenced by energy intake < 75% for > or equal to 3 months, moderate fat depletion, moderate muscle depletion, percent weight loss.  Ongoing  GOAL:   Patient will meet greater than or equal to 90% of their needs  Progressing   MONITOR:   PO intake, Supplement acceptance, Labs, Weight trends  REASON FOR ASSESSMENT:   Consult Assessment of nutrition requirement/status  ASSESSMENT:   Pt admitted for multiple septic emboli suspicious for possible endocarditis in the setting of IV drug use resulting in sepsis. PMH significant for polysubstance abuse.  7/13 - s/p I&D of bilateral hand abscesses 7/16 - s/p TEE confirming TV vegetation and trace pulmonic valve vegetation 7/20 - s/p angiovac debridement of tricuspid valve 7/23- intubated 7/28- extubated 7/29- advanced to Heart Healthy diet 8/24- s/p Multiple extraction of tooth numbers 3 and 28; 2 Quadrants of alveoloplasty; Gross debridement of remaining dentition  Reviewed I/O's +598 ml x 24 hours and +6 L since 05/31/20  Attempted to speak with pt via phone call to hospital room, however, no answer.   Per ID and advanced heart failure notes, plan for discharge to rehab later this week. He may require TV repair in the future.   Pt undergoing dental extractions today. He is currently on a clear liquid diet. He was previously on a heart healthy diet, consuming 100% of meals. Pt may require a mechanically altered diet due to dental extractions. ADDENDUM: Pt now on  a regular diet.  TOC team assisting in potential placement in substance abuse rehab as an outpatient.   Medications reviewed and include colace, doxycycline, miralax, and aldactone.   Labs reviewed.   Diet Order:   Diet Order            Diet regular Room service appropriate? Yes; Fluid consistency: Thin  Diet effective now                 EDUCATION NEEDS:   Not appropriate for education at this time  Skin:  Skin Assessment: Skin Integrity Issues: Skin Integrity Issues:: Incisions Stage II: - Incisions: lt arm, rt arm, rt neck, rt groin  Last BM:  06/13/20  Height:   Ht Readings from Last 1 Encounters:  06/14/20 5' 7.01" (1.702 m)    Weight:   Wt Readings from Last 1 Encounters:  06/14/20 60.2 kg   BMI:  Body mass index is 20.79 kg/m.  Estimated Nutritional Needs:   Kcal:  1850-2050  Protein:  105-120 grams  Fluid:  > 1.8 L    Levada Schilling, RD, LDN, CDCES Registered Dietitian II Certified Diabetes Care and Education Specialist Please refer to [facility] for RD and/or RD on-call/weekend/after hours pager

## 2020-06-14 NOTE — Telephone Encounter (Signed)
Sent in Manufacturer's Assistance application to [employer] for Entresto.  Sent in Manufacturer's Assistance application to [employer] for Corlanor.   Application pending, will continue to follow.  Shondale Quinley, PharmD, BCPS, BCCP, CPP Heart Failure Clinic Pharmacist [phone]  

## 2020-06-15 ENCOUNTER — Encounter (HOSPITAL_COMMUNITY): Payer: Self-pay | Admitting: Dentistry

## 2020-06-15 DIAGNOSIS — I339 Acute and subacute endocarditis, unspecified: Secondary | ICD-10-CM

## 2020-06-15 LAB — BASIC METABOLIC PANEL
Anion gap: 9 (ref 5–15)
BUN: 21 mg/dL — ABNORMAL HIGH (ref 6–20)
CO2: 27 mmol/L (ref 22–32)
Calcium: 9.6 mg/dL (ref 8.9–10.3)
Chloride: 103 mmol/L (ref 98–111)
Creatinine, Ser: 0.68 mg/dL (ref 0.61–1.24)
GFR calc Af Amer: >60 mL/min (ref >60)
GFR calc non Af Amer: >60 mL/min (ref >60)
Glucose, Bld: 104 mg/dL — ABNORMAL HIGH (ref 70–99)
Potassium: 4.4 mmol/L (ref 3.5–5.1)
Sodium: 139 mmol/L (ref 135–145)

## 2020-06-15 LAB — CBC
HCT: 36.4 % — ABNORMAL LOW (ref 39.0–52.0)
Hemoglobin: 11.1 g/dL — ABNORMAL LOW (ref 13.0–17.0)
MCH: 28.5 pg (ref 26.0–34.0)
MCHC: 30.5 g/dL (ref 30.0–36.0)
MCV: 93.3 fL (ref 80.0–100.0)
Platelets: 333 10*3/uL (ref 150–400)
RBC: 3.9 MIL/uL — ABNORMAL LOW (ref 4.22–5.81)
RDW: 17.9 % — ABNORMAL HIGH (ref 11.5–15.5)
WBC: 12.4 10*3/uL — ABNORMAL HIGH (ref 4.0–10.5)
nRBC: 0 % (ref 0.0–0.2)

## 2020-06-15 LAB — CK: Total CK: 27 U/L — ABNORMAL LOW (ref 49–397)

## 2020-06-15 MED ORDER — ALPRAZOLAM 0.25 MG PO TABS: 0.2500 mg | ORAL_TABLET | Freq: Three times a day (TID) | ORAL | Status: DC | PRN

## 2020-06-15 MED ORDER — FUROSEMIDE 10 MG/ML IJ SOLN
40.0000 mg | Freq: Once | INTRAMUSCULAR | Status: AC
  Administered 2020-06-15: 40 mg via INTRAVENOUS
  Filled 2020-06-15: qty 4

## 2020-06-15 MED ORDER — ALPRAZOLAM 0.25 MG PO TABS
0.2500 mg | ORAL_TABLET | Freq: Three times a day (TID) | ORAL | Status: DC | PRN
  Administered 2020-06-15: 0.25 mg via ORAL
  Filled 2020-06-15: qty 1

## 2020-06-15 MED ORDER — HYDROXYZINE HCL 10 MG PO TABS
10.0000 mg | ORAL_TABLET | Freq: Three times a day (TID) | ORAL | Status: DC | PRN
  Administered 2020-06-15 – 2020-06-16 (×2): 10 mg via ORAL
  Filled 2020-06-15 (×2): qty 1

## 2020-06-15 NOTE — Progress Notes (Signed)
[facility] PROGRESS NOTE    Progress Note  Clifford Clark  LEX:[id number] DOB: 11/22/1990 DOA: 04/28/2020 PCP: Patient, [name]     Brief Narrative:   Clifford Clark is an 29 y.o. 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  2D echo showed an EF of 60% with a PFO and a large vegetation in the tricuspid valve.  He underwent AngioJet removal of part of the vegetation,repeat a 2D echo showed an EF of 20%.  Extubated on 05/19/2020.  05/03/2020 I&D of abscess.  Grew MSSA. 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  Next  05/11/2020 upper extremity Doppler showed no DVT.  05/13/2020 CTA of the chest without PE multifocal and septic emboli.  With multiple cavitary lesions consolidation and anasarca.  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  04/28/2020 blood cultures grew MRSA repeated blood cultures from 05/13/2020 + for MRSA, repeated blood cultures on 05/17/2020 are negative till date 05/03/2020 fungal cultures remain negative till date.  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 Status post debulking with angio vac on 05/10/2020 due to large vegetation. ID is on board and appreciate assistance. Repeated 2D echo was on 05/19/2020 that showed an EF of 30%, with persistent tricuspid vegetation measuring 1.8 x 0.8 cm-to the posterior leaflet.  With severe tricuspid regurgitation. ID was consulted regarding the dual anti-MRSA treatment with IV daptomycin and doxycycline until 06/17/2020. He also received one dose of Ivabradine. He is hoping to go to the more 30-day residential program after he gets discharged. They will not take him on oral narcotics or benzodiazepine we will change him to Vistaril for anxiety. Dental wax consulted due to his extensive dental cavities which are curiously asymptomatic and they do have periapical pathology, the patient again adamantly refused dental extraction of his teeth. He wishes to proceed with extraction of tooth #3 and ##28  Acute respiratory failure with hypoxia: Extubated on 05/18/2020.  Septic shock requiring pressors: Secondary to MRSA bacteremia on admission started on pressors weaned, then off pressors and transfer to progressive unit on 05/20/2020. Continue current antibiotic regimen will need at least 6 weeks of antibiotics. Check CK per pharmacy protocol.  Acute systolic heart failure: Initial 2D echo on admission showed an EF of 60%, repeat a 2D echo on 05/14/2020 showed a reduced EF of 20%.  He was started on  IV Lasix with good diuresis, once he was close to euvolemia it was stopped. He was continued on Coreg, Entresto and  Aldactone and continues to diurese nicely.  He is holding steady. He will eventually need TV repair if he is a candidate. Panorex confirm multiple cavities he refused dental extraction except for tooth #3 and twenty-eight. Advanced heart failure team has been consulted. He is currently on Aldactone, Corlanor,Coreg and Entresto.  Normocytic anemia: Hemoglobin is now 10 appears to be stable. Signs of overt bleeding.  In the setting of infectious etiology.  HypoKalemia: Resolved with oral repletion and Aldactone.  Substance abuse disorder: Patient admits to opiates and cocaine abuse. Will need counseling.  Bilateral wrist abscess: Orthopedic surgery was consulted and he was I&D on 05/03/2020 cultures grew MSSA. Suture removal by orthopedic surgery.  Malnutrition of moderate degree Ensure 3 times daily.  DVT prophylaxis: lovenox Family Communication:none Status is: Inpatient  Remains inpatient appropriate because:Hemodynamically unstable   Dispo: The patient is from: Home              Anticipated d/c is to: Home              Anticipated d/c date is: 3 days              Patient currently is not medically stable to d/c.  He will complete his antibiotic regimen on 06/17/2020.     Code Status:     Code Status Orders  (From admission, onward)         Start     Ordered   04/28/20 2123  Full code  Continuous        04/28/20 2122        Code Status History    Date Active Date Inactive Code Status Order ID Comments User Context   01/24/2019 1806 01/28/2019 1001 Full Code [id number]  Oneta Rack, NP Inpatient   01/24/2019 1451 01/24/2019 1752 Full Code [id number]  Raeford Razor, MD ED   01/25/2013 1812 01/26/2013 1256 Full Code [id number]  Desmond Dike ED   Advance Care Planning Activity        IV Access:    Peripheral IV   Procedures and diagnostic studies:   No results found.   Medical Consultants:    None.  Anti-Infectives:   Currently on daptomycin  and Teflaro  Subjective:    Clifford Clark no new complaints.  Objective:    Vitals:   06/15/20 0001 06/15/20 0238 06/15/20 0659 06/15/20 1139  BP: 109/71  101/61 101/67  Pulse: 90  83 (!) 102  Resp: 17  16 18   Temp: (!) 97.4 F (36.3 C)  97.9 F (36.6 C) 97.8 F (36.6 C)  TempSrc: Oral  Oral Oral  SpO2: 100%  100% 100%  Weight:  62.5 kg    Height:       SpO2: 100 % O2 Flow Rate (L/min): 0 L/min FiO2 (%): 40 %   Intake/Output Summary (Last 24 hours) at 06/15/2020 1308 Last data filed at 06/15/2020 1000 Gross per 24 hour  Intake 1570 ml  Output --  Net 1570 ml   Filed Weights   06/13/20 0543 06/14/20 0557 06/15/20 0238  Weight: 60 kg 60.2 kg 62.5 kg    Exam: General exam: In no acute distress. Respiratory system: Good air movement and clear to auscultation. Cardiovascular system: S1 & S2 heard, RRR. No JVD. Gastrointestinal system: Abdomen is nondistended, soft and nontender.  Extremities: No pedal edema. Skin: No rashes, lesions  or ulcers Psychiatry: Judgement and insight appear normal. Mood & affect appropriate.   Data Reviewed:    Labs: Basic Metabolic Panel: Recent Labs  Lab 06/08/20 1706 06/08/20 1706 06/09/20 0935 06/09/20 0935 06/13/20 1215 06/13/20 1215 06/14/20 1436 06/15/20 0656  NA 137  --  136  --  138  --  135 139  K 4.3   < > 4.1   < > 4.4   < > 4.8 4.4  CL 102  --  102  --  101  --  101 103  CO2 24  --  25  --  25  --  24 27  GLUCOSE 109*  --  99  --  82  --  266* 104*  BUN 29*  --  23*  --  27*  --  27* 21*  CREATININE 0.75  --  0.69  --  0.68  --  0.89 0.68  CALCIUM 9.4  --  9.3  --  10.2  --  9.7 9.6  MG  --   --  2.0  --   --   --   --   --   PHOS  --   --  4.5  --   --   --   --   --    < > = values in this interval not displayed.   GFR Estimated Creatinine Clearance: 119.4 mL/min (by C-G formula based on SCr of 0.68 mg/dL). Liver Function Tests: No results for input(s): AST, ALT, ALKPHOS, BILITOT, PROT, ALBUMIN in the  last 168 hours. No results for input(s): LIPASE, AMYLASE in the last 168 hours. No results for input(s): AMMONIA in the last 168 hours. Coagulation profile No results for input(s): INR, PROTIME in the last 168 hours. COVID-19 Labs  No results for input(s): DDIMER, FERRITIN, LDH, CRP in the last 72 hours.  Lab Results  Component Value Date   SARSCOV2NAA NEGATIVE 04/28/2020    CBC: Recent Labs  Lab 06/08/20 1706 06/09/20 0935 06/15/20 1148  WBC 9.1 8.7 12.4*  HGB 9.7* 10.1* 11.1*  HCT 31.5* 32.3* 36.4*  MCV 91.8 92.3 93.3  PLT 268 280 333   Cardiac Enzymes: Recent Labs  Lab 06/08/20 1706 06/15/20 0656  CKTOTAL 17* 27*   BNP (last 3 results) No results for input(s): PROBNP in the last 8760 hours. CBG: No results for input(s): GLUCAP in the last 168 hours. D-Dimer: No results for input(s): DDIMER in the last 72 hours. Hgb A1c: No results for input(s): HGBA1C in the last 72 hours. Lipid Profile: No results for input(s): CHOL, HDL, LDLCALC, TRIG, CHOLHDL, LDLDIRECT in the last 72 hours. Thyroid function studies: No results for input(s): TSH, T4TOTAL, T3FREE, THYROIDAB in the last 72 hours.  Invalid input(s): FREET3 Anemia work up: No results for input(s): VITAMINB12, FOLATE, FERRITIN, TIBC, IRON, RETICCTPCT in the last 72 hours. Sepsis Labs: Recent Labs  Lab 06/08/20 1706 06/09/20 0935 06/15/20 1148  WBC 9.1 8.7 12.4*   Microbiology Recent Results (from the past 240 hour(s))  Surgical pcr screen     Status: None   Collection Time: 06/14/20  5:39 AM   Specimen: Nasal Mucosa; Nasal Swab  Result Value Ref Range Status   MRSA, PCR NEGATIVE NEGATIVE Final   Staphylococcus aureus NEGATIVE NEGATIVE Final    Comment: (NOTE) The Xpert SA Assay (FDA approved for NASAL specimens in patients 17 years of age and older), is one component of a comprehensive surveillance program. It is not intended to diagnose infection  nor to guide or monitor treatment. Performed at  [facility], [address] [place], [place] [address]      Medications:   . carvedilol  6.25 mg Oral BID WC  . chlorhexidine  15 mL Mouth/Throat BID  . docusate sodium  100 mg Oral BID  . doxycycline  100 mg Oral Q12H  . feeding supplement (ENSURE ENLIVE)  237 mL Oral TID BM  . ivabradine  7.5 mg Oral BID WC  . multivitamin with minerals  1 tablet Oral Daily  . pantoprazole  40 mg Oral Daily  . polyethylene glycol  17 g Oral Daily  . sacubitril-valsartan  1 tablet Oral BID  . sodium chloride flush  10-40 mL Intracatheter Q12H  . spironolactone  25 mg Oral Daily   Continuous Infusions: . sodium chloride Stopped (06/08/20 2209)  . DAPTOmycin (CUBICIN)  IV 500 mg (06/14/20 2124)      LOS: 48 days   Clifford Clark  [facility]  06/15/2020, 1:08 PM

## 2020-06-15 NOTE — Social Work (Signed)
8/25: CSW reached out to June at [facility] to check on bed availability and confirm Pt won't have any issues with his meds (per RN Irving Burton), had to leave a vm.

## 2020-06-15 NOTE — Progress Notes (Addendum)
Advanced Heart Failure Rounding Note  PCP-Cardiologist: No primary care provider on file.    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 ER w/u showed multiple septic emboli to lungs with cavitary lesions. 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  Also found to have emboli to kidneys and hands. TEE with EF 60-65% small PFO. large TV vegetation with severe TR. No vegetations on Aov/MV despite left-sided emboli.  Underwent AngioJet removal of part of vegetation. Then developed MRSA sepsis and respiratory failure. Intubated and started on pressors. F/u echo LVEF 20-25%. RV normal.  hstrop 192 -> 190.     Subjective:    S/p multiple extraction of tooth numbers 3 and 28, 2 Quadrants of alveoloplasty and gross debridement of remaining dentition. No significant mouth/jaw pain today. F/u CBC pending.   Tachycardic, ST w/ HR in the 110s resting, 120s w/ ambulation. SBP 90s-low 100s.  Wt up 5 lb. Received perioperative IVFs. Net I/Os + 1.5 L yesterday. He feels mildly SOB.    Objective:   Weight Range: 62.5 kg Body mass index is 21.56 kg/m.   Vital Signs:   Temp:  [97.4 F (36.3 C)-97.9 F (36.6 C)] 97.9 F (36.6 C) (08/25 0659) Pulse Rate:  [83-95] 83 (08/25 0659) Resp:  [16-21] 16 (08/25 0659) BP: (101-121)/(58-80) 101/61 (08/25 0659) SpO2:  [95 %-100 %] 100 % (08/25 0659) Weight:  [62.5 kg] 62.5 kg (08/25 0238) Last BM Date: 06/14/20  Weight change: Filed Weights   06/13/20 0543 06/14/20 0557 06/15/20 0238  Weight: 60 kg 60.2 kg 62.5 kg    Intake/Output:   Intake/Output Summary (Last 24 hours) at 06/15/2020 1017 Last data filed at 06/14/2020 2220 Gross per 24 hour  Intake 1550 ml  Output 50 ml  Net 1500 ml      Physical Exam    PHYSICAL EXAM: General:  Young male, sitting up in chair. Well appearing. No resp difficulty HEENT: normal Neck: supple. JVD ` 8 cm. Carotids 2+ bilat; no bruits. No lymphadenopathy  or thryomegaly appreciated. Cor: PMI nondisplaced. Regular rhythm, tachy rate. No rubs or gallops. + 2/6 TR murmur.  Lungs: clear Abdomen: soft, nontender, nondistended. No hepatosplenomegaly. No bruits or masses. Good bowel sounds. Extremities: no cyanosis, clubbing, rash, edema Neuro: alert & orientedx3, cranial nerves grossly intact. moves all 4 extremities w/o difficulty. Affect pleasant   Telemetry   Sinus tach 110s resting, 120s w/ ambulation   Labs    CBC No results for input(s): WBC, NEUTROABS, HGB, HCT, MCV, PLT in the last 72 hours. Basic Metabolic Panel Recent Labs    16/10/96 1436 06/15/20 0656  NA 135 139  K 4.8 4.4  CL 101 103  CO2 24 27  GLUCOSE 266* 104*  BUN 27* 21*  CREATININE 0.89 0.68  CALCIUM 9.7 9.6   Liver Function Tests No results for input(s): AST, ALT, ALKPHOS, BILITOT, PROT, ALBUMIN in the last 72 hours. No results for input(s): LIPASE, AMYLASE in the last 72 hours. Cardiac Enzymes Recent Labs    06/15/20 0656  CKTOTAL 27*    BNP: BNP (last 3 results) No results for input(s): BNP in the last 8760 hours.  ProBNP (last 3 results) No results for input(s): PROBNP in the last 8760 hours.   D-Dimer No results for input(s): DDIMER in the last 72 hours. Hemoglobin A1C No results for input(s): HGBA1C in the last 72 hours. Fasting Lipid  Panel No results for input(s): CHOL, HDL, LDLCALC, TRIG, CHOLHDL, LDLDIRECT in the last 72 hours. Thyroid Function Tests No results for input(s): TSH, T4TOTAL, T3FREE, THYROIDAB in the last 72 hours.  Invalid input(s): FREET3  Other results:   Imaging    No results found.   Medications:     Scheduled Medications: . carvedilol  6.25 mg Oral BID WC  . chlorhexidine  15 mL Mouth/Throat BID  . docusate sodium  100 mg Oral BID  . doxycycline  100 mg Oral Q12H  . feeding supplement (ENSURE ENLIVE)  237 mL Oral TID BM  . ivabradine  7.5 mg Oral BID WC  . multivitamin with minerals  1 tablet Oral  Daily  . pantoprazole  40 mg Oral Daily  . polyethylene glycol  17 g Oral Daily  . sacubitril-valsartan  1 tablet Oral BID  . sodium chloride flush  10-40 mL Intracatheter Q12H  . spironolactone  25 mg Oral Daily    Infusions: . sodium chloride Stopped (06/08/20 2209)  . DAPTOmycin (CUBICIN)  IV 500 mg (06/14/20 2124)  . lactated ringers      PRN Medications: acetaminophen **OR** acetaminophen, ALPRAZolam, ipratropium-albuterol, metoprolol tartrate, ondansetron **OR** ondansetron (ZOFRAN) IV, sodium chloride flush, traMADol, zolpidem   Assessment/Plan   1. 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 There was also amall flickering leaflet mass, suggestive of vegetation of the pulmonic valve. No mitral or aortic valve pathology. - s/p Angiovac debulking on 7/20 but still with large vegetation  - abx per ID. Now off vancomycin, now on daptomycin + doxy  -  Will eventually need TV repair if he is a candidate.  - Panorex confirmed multiple cavities. S/p dental extraction of tooth numbers 3 and 28 amd gross debridement of remaining dentition 8/24  2. Acute Hypoxic Respiratory Failure - Resolved. Extubated 7/28 . Sats stable on room air.   3. Septic Shock  - 2/2 MRSA Bacteremia - resolved  - continue abx per ID as above   4. Acute Systolic Heart Failure - initial Echo 7/9 w/ normal LVEF 50-55%. RV normal - TEE 7/16, LVEF normal 60-65%, RV normal - Limited Echo 7/24, LVEF severely reduced 20-25%, RV systolic function hyperdynamic. MV w/ mild-mod MR but no seen vegetation  - Hs trop 192 -> 190. Trend flat, most c/w demand ischemia and not ACS - suspect acute drop in EF stress induced from septic shock. Doubt CAD - Repeat limited echo 7/29 w/ interval improvement, LVEF up to 30-35%. Given ongoing tachycardia ECHO was repeated and  showed EF 35-40% with on large vegetation, RV mildly reduced.  - Wt up 5 lb after perioperative fluids. Mildly SOB. Give 40 mg IV Lasix x 1  - Continue Spiro to 25 mg daily.  - Continue corlanor to 7.5 mg bid . Pharmacy assisting w/ corlanor coverage - Continue coreg 6.25 mg twice a day. May need further titration if HR does not improve w/ diuresis (BP a bit soft).   -Continue Entresto 24-26 mg bid. NO room to titrate with soft SBP.    5. Anemia - transfuse for hgb <7.0  - hgb ok last blood draw, 10.1  - Check CBC today   6. Drug Abuse: - IVDU prior to admit - Plan [facility] 30 day residential program post d/c  - Will  plan virtual post hospital visit in our clinic   Length of Stay: [address], [place]  06/15/2020, 10:17 AM  Advanced Heart Failure Team Pager [phone] (M-F; 7a - 4p)  Please contact [facility] for night-coverage after hours (4p -7a ) and weekends on [email or web address] Robbie Lis, PA-C  10:17 AM    Patient seen and examined with the above-signed Advanced Practice Provider and/or Housestaff. I personally reviewed laboratory data, imaging studies and relevant notes. I independently examined the patient and formulated the important aspects of the plan. I have edited the note to reflect any of my changes or salient points. I have personally discussed the plan with the patient and/or family.  Recovering from dental extractions. Volume status up post-op  General:  Sitting in chair  No resp difficulty HEENT: normal x for post-op changes Neck: supple. JVP 9-10 with prominent CV waves . Carotids 2+ bilat; no bruits. No lymphadenopathy or thryomegaly appreciated. Cor: PMI nondisplaced. Regular rate & rhythm. No rubs, gallops or murmurs. Lungs: clear Abdomen: soft, nontender, nondistended. No hepatosplenomegaly. No bruits or masses. Good bowel sounds. Extremities: no cyanosis, clubbing, rash, edema Neuro: alert & orientedx3, cranial nerves grossly  intact. moves all 4 extremities w/o difficulty. Affect pleasant  Continue current HF regimen. Agree with IV lasix. Based on his questions regarding restriction at the treatment center I have significant concerns about his commitment to drug rehab.   Arvilla Meres, MD  6:40 PM

## 2020-06-15 NOTE — Progress Notes (Signed)
°    [facility]    Date of Admission:  04/28/2020   Total days of antibiotics 26   ID: Clifford Clark is a 29 y.o. male with   Principal Problem:   Endocarditis Active Problems:   Intravenous drug abuse (HCC)   Septic embolism (HCC)   Sepsis (HCC)   Thrombocytopenia (HCC)   Hypokalemia   Hyponatremia   MSSA bacteremia   Malnutrition of moderate degree   Threatening behavior   Hand abscess   Elevated BUN   Pressure injury of skin   Chest pain   Acute respiratory failure (HCC)   Acute systolic heart failure (HCC)    Subjective: Afebrile, poor sleep and feeling anxious. Depressed that he is not going to rehab since too many factors to align with court date, medical appt   Medications:   carvedilol  6.25 mg Oral BID WC   chlorhexidine  15 mL Mouth/Throat BID   docusate sodium  100 mg Oral BID   doxycycline  100 mg Oral Q12H   feeding supplement (ENSURE ENLIVE)  237 mL Oral TID BM   ivabradine  7.5 mg Oral BID WC   multivitamin with minerals  1 tablet Oral Daily   pantoprazole  40 mg Oral Daily   polyethylene glycol  17 g Oral Daily   sacubitril-valsartan  1 tablet Oral BID   sodium chloride flush  10-40 mL Intracatheter Q12H   spironolactone  25 mg Oral Daily    Objective: Vital signs in last 24 hours: Temp:  [97.4 F (36.3 C)-97.9 F (36.6 C)] 97.5 F (36.4 C) (08/25 1742) Pulse Rate:  [83-102] 92 (08/25 1742) Resp:  [16-18] 18 (08/25 1742) BP: (93-115)/(47-71) 93/47 (08/25 1742) SpO2:  [99 %-100 %] 99 % (08/25 1742) Weight:  [62.5 kg] 62.5 kg (08/25 0238) Physical Exam  Constitutional: He is oriented to person, place, and time. He appears well-developed and well-nourished. No distress.  HENT:  Mouth/Throat: Oropharynx is clear and moist. No oropharyngeal exudate.  Cardiovascular: Normal rate, regular rhythm and normal heart sounds. Exam reveals no gallop and no friction rub.  No murmur heard.  Pulmonary/Chest: Effort  normal and breath sounds normal. No respiratory distress. He has no wheezes.  Abdominal: Soft. Bowel sounds are normal. He exhibits no distension. There is no tenderness.  Lymphadenopathy:  He has no cervical adenopathy.  Neurological: He is alert and oriented to person, place, and time.  Skin: Skin is warm and dry. No rash noted. No erythema.  Psychiatric: He has a normal mood and affect. His behavior is normal.     Lab Results Recent Labs    06/14/20 1436 06/15/20 0656 06/15/20 1148  WBC  --   --  12.4*  HGB  --   --  11.1*  HCT  --   --  36.4*  NA 135 139  --   K 4.8 4.4  --   CL 101 103  --   CO2 24 27  --   BUN 27* 21*  --   CREATININE 0.89 0.68  --     Microbiology: reviewed Studies/Results: No results found.   Assessment/Plan: MRSA bacteremia/TV endocarditis with prolonged bacteremia = plan to continue on daptomycin until 8/27,   mrsa cavitary pneumonia = will continue on doxycycline.  Will provide letter of medical clearance for patient to give to rehab for intake  [facility] Cell: [phone] Pager: [phone]  06/15/2020, 5:48 PM

## 2020-06-16 ENCOUNTER — Encounter: Payer: Self-pay | Admitting: Internal Medicine

## 2020-06-16 DIAGNOSIS — J181 Lobar pneumonia, unspecified organism: Secondary | ICD-10-CM

## 2020-06-16 LAB — BASIC METABOLIC PANEL
Anion gap: 11 (ref 5–15)
BUN: 23 mg/dL — ABNORMAL HIGH (ref 6–20)
CO2: 28 mmol/L (ref 22–32)
Calcium: 10.3 mg/dL (ref 8.9–10.3)
Chloride: 100 mmol/L (ref 98–111)
Creatinine, Ser: 0.67 mg/dL (ref 0.61–1.24)
GFR calc Af Amer: >60 mL/min (ref >60)
GFR calc non Af Amer: >60 mL/min (ref >60)
Glucose, Bld: 90 mg/dL (ref 70–99)
Potassium: 4 mmol/L (ref 3.5–5.1)
Sodium: 139 mmol/L (ref 135–145)

## 2020-06-16 MED ORDER — MENTHOL 3 MG MT LOZG
1.0000 | LOZENGE | OROMUCOSAL | Status: DC | PRN
  Administered 2020-06-16: 3 mg via ORAL
  Filled 2020-06-16: qty 9

## 2020-06-16 MED ORDER — ORITAVANCIN DIPHOSPHATE 400 MG IV SOLR
1200.0000 mg | Freq: Once | INTRAVENOUS | Status: DC
  Filled 2020-06-16: qty 120

## 2020-06-16 MED ORDER — TRAZODONE HCL 50 MG PO TABS
50.0000 mg | ORAL_TABLET | Freq: Every day | ORAL | Status: DC
  Administered 2020-06-16: 50 mg via ORAL
  Filled 2020-06-16: qty 1

## 2020-06-16 MED ORDER — HYDROXYZINE HCL 25 MG PO TABS
25.0000 mg | ORAL_TABLET | Freq: Three times a day (TID) | ORAL | Status: DC | PRN
  Administered 2020-06-16 (×2): 25 mg via ORAL
  Filled 2020-06-16 (×2): qty 1

## 2020-06-16 MED ORDER — FLUOXETINE HCL 20 MG PO CAPS
20.0000 mg | ORAL_CAPSULE | Freq: Every day | ORAL | Status: DC
  Administered 2020-06-17: 20 mg via ORAL
  Filled 2020-06-16 (×2): qty 1

## 2020-06-16 NOTE — Social Work (Signed)
8/26: CSW spoke with pt in reference to [facility] declining admission due to his medical status. Pt was understandably upset but also understood. We spoke about other options, including intensive outpatient rehab. Will provide SA packet on 8/27.

## 2020-06-16 NOTE — Progress Notes (Signed)
[facility] PROGRESS NOTE    Progress Note  Clifford Clark  ZOX:[id number] DOB: [birth year] DOA: 04/28/2020 PCP: Patient, [name]     Brief Narrative:   Clifford Clark is an 29 y.o. 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  2D echo showed an EF of 60% with a PFO and a large vegetation in the tricuspid valve.  He underwent AngioJet removal of part of the vegetation,repeat a 2D echo showed an EF of 20%.  Extubated on 05/19/2020.  05/03/2020 I&D of abscess.  Grew MSSA. 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  Next  05/11/2020 upper extremity Doppler showed no DVT.  05/13/2020 CTA of the chest without PE multifocal and septic emboli.  With multiple cavitary lesions consolidation and anasarca.  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  04/28/2020 blood cultures grew MRSA repeated blood cultures from 05/13/2020 + for MRSA, repeated blood cultures on 05/17/2020 are negative till date 05/03/2020 fungal cultures remain negative till date.  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 Status post debulking with angio vac on 05/10/2020 due to large vegetation. ID is on board and appreciate assistance. Repeated 2D echo was on 05/19/2020 that showed an EF of 30%, with persistent tricuspid vegetation measuring 1.8 x 0.8 cm-to the posterior leaflet.  With severe tricuspid regurgitation. ID was consulted regarding the dual anti-MRSA treatment with IV daptomycin and doxycycline until 06/17/2020. He also received one dose of Ivabradine. He is hoping to go to the more 30-day residential program after he gets discharged. They will not take him on oral narcotics or benzodiazepine we will change him to Vistaril for anxiety. Dental wax consulted due to his extensive dental cavities which are curiously asymptomatic and they do have periapical pathology, the patient again adamantly refused dental extraction of his teeth. He wishes to proceed with extraction of tooth #3 and #28. Patient relates he is not going to rehab he will go home.  Acute respiratory failure with hypoxia: Extubated on 05/18/2020.  Septic shock requiring pressors: Secondary to MRSA bacteremia on admission started on pressors weaned, then off pressors and transfer to progressive unit on 05/20/2020. Continue current antibiotic regimen will need at least 6 weeks of antibiotics. Check CK per pharmacy protocol.  Acute systolic heart failure: Initial 2D echo on admission showed an EF of 60%, repeat a 2D echo on  05/14/2020 showed a reduced EF of 20%.  He was started on IV Lasix with good diuresis, once he was close to  euvolemia it was stopped. He was continued on Coreg, Entresto and Aldactone and continues to diurese nicely.  He is holding steady. He will eventually need TV repair if he is a candidate. Panorex confirm multiple cavities he refused dental extraction except for tooth #3 and twenty-eight. Advanced heart failure team has been consulted. He is currently on Aldactone, Corlanor,Coreg and Entresto.  Normocytic anemia: Hemoglobin is now 10 appears to be stable. Signs of overt bleeding.  In the setting of infectious etiology.  HypoKalemia: Resolved with oral repletion and Aldactone.  Substance abuse disorder: Patient admits to opiates and cocaine abuse. Will need counseling.  Bilateral wrist abscess: Orthopedic surgery was consulted and he was I&D on 05/03/2020 cultures grew MSSA. Suture removal by orthopedic surgery.  Malnutrition of moderate degree Ensure 3 times daily.  DVT prophylaxis: lovenox Family Communication:none Status is: Inpatient  Remains inpatient appropriate because:Hemodynamically unstable   Dispo: The patient is from: Home              Anticipated d/c is to: Home              Anticipated d/c date is: 3 days              Patient currently is not medically stable to d/c.  He will complete his antibiotic regimen on 06/17/2020.     Code Status:     Code Status Orders  (From admission, onward)         Start     Ordered   04/28/20 2123  Full code  Continuous        04/28/20 2122        Code Status History    Date Active Date Inactive Code Status Order ID Comments User Context   01/24/2019 1806 01/28/2019 1001 Full Code [id number]  Oneta Rack, NP Inpatient   01/24/2019 1451 01/24/2019 1752 Full Code [id number]  Raeford Razor, MD ED   01/25/2013 1812 01/26/2013 1256 Full Code [id number]  Desmond Dike ED   Advance Care Planning Activity        IV Access:    Peripheral IV   Procedures and diagnostic studies:   No results found.   Medical  Consultants:    None.  Anti-Infectives:   Currently on daptomycin and Teflaro  Subjective:    Clifford Clark no new complaints.  Objective:    Vitals:   06/15/20 1942 06/16/20 0024 06/16/20 0458 06/16/20 0840  BP: 103/69 102/63 (!) 97/57 103/65  Pulse: 93 81 78 (!) 102  Resp: 18 18 19 18   Temp: 98.1 F (36.7 C) 97.8 F (36.6 C) 98 F (36.7 C) 97.9 F (36.6 C)  TempSrc: Oral Oral Oral Oral  SpO2: 100% 100% 99% 100%  Weight:   67.7 kg   Height:       SpO2: 100 % O2 Flow Rate (L/min): 0 L/min FiO2 (%): 40 %   Intake/Output Summary (Last 24 hours) at 06/16/2020 1020 Last data filed at 06/16/2020 0700 Gross per 24 hour  Intake 1560 ml  Output --  Net 1560 ml   Filed Weights   06/14/20 0557 06/15/20 0238 06/16/20 0458  Weight: 60.2 kg 62.5 kg 67.7 kg    Exam: General exam: In no acute distress. Respiratory system: Good air movement and clear to auscultation. Cardiovascular system: S1 & S2 heard, RRR. No JVD. Gastrointestinal system:  Abdomen is nondistended, soft and nontender.  Extremities: No pedal edema. Skin: No rashes, lesions or ulcers Psychiatry: Judgement and insight appear normal. Mood & affect appropriate.   Data Reviewed:    Labs: Basic Metabolic Panel: Recent Labs  Lab 06/13/20 1215 06/13/20 1215 06/14/20 1436 06/14/20 1436 06/15/20 0656 06/16/20 0750  NA 138  --  135  --  139 139  K 4.4   < > 4.8   < > 4.4 4.0  CL 101  --  101  --  103 100  CO2 25  --  24  --  27 28  GLUCOSE 82  --  266*  --  104* 90  BUN 27*  --  27*  --  21* 23*  CREATININE 0.68  --  0.89  --  0.68 0.67  CALCIUM 10.2  --  9.7  --  9.6 10.3   < > = values in this interval not displayed.   GFR Estimated Creatinine Clearance: 126.2 mL/min (by C-G formula based on SCr of 0.67 mg/dL). Liver Function Tests: No results for input(s): AST, ALT, ALKPHOS, BILITOT, PROT, ALBUMIN in the last 168 hours. No results for input(s): LIPASE, AMYLASE in the last 168 hours. No  results for input(s): AMMONIA in the last 168 hours. Coagulation profile No results for input(s): INR, PROTIME in the last 168 hours. COVID-19 Labs  No results for input(s): DDIMER, FERRITIN, LDH, CRP in the last 72 hours.  Lab Results  Component Value Date   SARSCOV2NAA NEGATIVE 04/28/2020    CBC: Recent Labs  Lab 06/15/20 1148  WBC 12.4*  HGB 11.1*  HCT 36.4*  MCV 93.3  PLT 333   Cardiac Enzymes: Recent Labs  Lab 06/15/20 0656  CKTOTAL 27*   BNP (last 3 results) No results for input(s): PROBNP in the last 8760 hours. CBG: No results for input(s): GLUCAP in the last 168 hours. D-Dimer: No results for input(s): DDIMER in the last 72 hours. Hgb A1c: No results for input(s): HGBA1C in the last 72 hours. Lipid Profile: No results for input(s): CHOL, HDL, LDLCALC, TRIG, CHOLHDL, LDLDIRECT in the last 72 hours. Thyroid function studies: No results for input(s): TSH, T4TOTAL, T3FREE, THYROIDAB in the last 72 hours.  Invalid input(s): FREET3 Anemia work up: No results for input(s): VITAMINB12, FOLATE, FERRITIN, TIBC, IRON, RETICCTPCT in the last 72 hours. Sepsis Labs: Recent Labs  Lab 06/15/20 1148  WBC 12.4*   Microbiology Recent Results (from the past 240 hour(s))  Surgical pcr screen     Status: None   Collection Time: 06/14/20  5:39 AM   Specimen: Nasal Mucosa; Nasal Swab  Result Value Ref Range Status   MRSA, PCR NEGATIVE NEGATIVE Final   Staphylococcus aureus NEGATIVE NEGATIVE Final    Comment: (NOTE) The Xpert SA Assay (FDA approved for NASAL specimens in patients 74 years of age and older), is one component of a comprehensive surveillance program. It is not intended to diagnose infection nor to guide or monitor treatment. Performed at [facility], [address] [place], [place] [address]      Medications:   . carvedilol  6.25 mg Oral BID WC  . chlorhexidine  15 mL Mouth/Throat BID  . docusate sodium  100 mg Oral BID  . doxycycline   100 mg Oral Q12H  . feeding supplement (ENSURE ENLIVE)  237 mL Oral TID BM  . ivabradine  7.5 mg Oral BID WC  . multivitamin with minerals  1 tablet Oral Daily  .  pantoprazole  40 mg Oral Daily  . polyethylene glycol  17 g Oral Daily  . sacubitril-valsartan  1 tablet Oral BID  . sodium chloride flush  10-40 mL Intracatheter Q12H  . spironolactone  25 mg Oral Daily   Continuous Infusions: . sodium chloride Stopped (06/08/20 2209)  . DAPTOmycin (CUBICIN)  IV 500 mg (06/15/20 2056)      LOS: 49 days   Marinda Elk  [facility]  06/16/2020, 10:20 AM

## 2020-06-16 NOTE — Plan of Care (Signed)

## 2020-06-16 NOTE — Progress Notes (Signed)
Pt refused Prozac.

## 2020-06-16 NOTE — Progress Notes (Signed)
[facility]  Date of Admission:  04/28/2020     Total days of antibiotics 49         ASSESSMENT:  Clifford Clark completes Daptomycin tomorrow and will plan for 1 dose of oritivancin and continued doxycycline through 9/14. Will need close follow up with cardiology and evaluation by CVTS. I am concerned about his change in rehabilitation plans with no longer going to [facility]. Appears to be at high risk for relapse given multiple stressors. Continues to have concern about anxiety and sleep. Discussed that Xanax is not a good option and currently receiving Ambien. Will defer changes to sleep/anxiety to primary team. Continue daptomycin and doxycyline through tomorrow.   PLAN:   1. Continue current daptomycin through tomorrow.  2. Continue doxycyline through 9/14. 3. Oritivancin tomorrow.  4. .Sleep and anxiety management per primary team.  5.  Needs close follow up and substance abuse counseling.   Principal Problem:   Endocarditis Active Problems:   Intravenous drug abuse (HCC)   Septic embolism (HCC)   Sepsis (HCC)   Thrombocytopenia (HCC)   Hypokalemia   Hyponatremia   MSSA bacteremia   Malnutrition of moderate degree   Threatening behavior   Hand abscess   Elevated BUN   Pressure injury of skin   Chest pain   Acute respiratory failure (HCC)   Acute systolic heart failure (HCC)   . carvedilol  6.25 mg Oral BID WC  . chlorhexidine  15 mL Mouth/Throat BID  . docusate sodium  100 mg Oral BID  . doxycycline  100 mg Oral Q12H  . feeding supplement (ENSURE ENLIVE)  237 mL Oral TID BM  . ivabradine  7.5 mg Oral BID WC  . multivitamin with minerals  1 tablet Oral Daily  . pantoprazole  40 mg Oral Daily  . polyethylene glycol  17 g Oral Daily  . sacubitril-valsartan  1 tablet Oral BID  . sodium chloride flush  10-40 mL Intracatheter Q12H  . spironolactone  25 mg Oral Daily    SUBJECTIVE:  Afebrile overnight with no acute events. Continues to  have issues with sleeping and anxiety. Ambien and Xanax work the best for him. No longer going to [facility] for [facility]. Now going to [facility] and would like to have medical conditions under control prior to going. Has plans at discharge to live with his mother and stepfather. Stepfather is now retired and will at home regularly.   No Known Allergies   Review of Systems: Review of Systems  Constitutional: Negative for chills, fever and weight loss.  Respiratory: Negative for cough, shortness of breath and wheezing.   Cardiovascular: Negative for chest pain and leg swelling.  Gastrointestinal: Negative for abdominal pain, constipation, diarrhea, nausea and vomiting.  Skin: Negative for rash.      OBJECTIVE: Vitals:   06/15/20 1942 06/16/20 0024 06/16/20 0458 06/16/20 0840  BP: 103/69 102/63 (!) 97/57 103/65  Pulse: 93 81 78 (!) 102  Resp: 18 18 19 18   Temp: 98.1 F (36.7 C) 97.8 F (36.6 C) 98 F (36.7 C) 97.9 F (36.6 C)  TempSrc: Oral Oral Oral Oral  SpO2: 100% 100% 99% 100%  Weight:   67.7 kg   Height:       Body mass index is 23.36 kg/m.  Physical Exam Constitutional:      General: He is not in acute distress.    Appearance: He is well-developed.  Cardiovascular:     Rate and Rhythm:  Normal rate and regular rhythm.     Heart sounds: Normal heart sounds.  Pulmonary:     Effort: Pulmonary effort is normal.     Breath sounds: Normal breath sounds.  Skin:    General: Skin is warm and dry.  Neurological:     Mental Status: He is alert and oriented to person, place, and time.  Psychiatric:        Behavior: Behavior normal.        Thought Content: Thought content normal.        Judgment: Judgment normal.     Lab Results Lab Results  Component Value Date   WBC 12.4 (H) 06/15/2020   HGB 11.1 (L) 06/15/2020   HCT 36.4 (L) 06/15/2020   MCV 93.3 06/15/2020   PLT 333 06/15/2020    Lab Results  Component Value Date   CREATININE 0.67  06/16/2020   BUN 23 (H) 06/16/2020   NA 139 06/16/2020   K 4.0 06/16/2020   CL 100 06/16/2020   CO2 28 06/16/2020    Lab Results  Component Value Date   ALT 114 (H) 05/25/2020   AST 84 (H) 05/25/2020   ALKPHOS 95 05/25/2020   BILITOT 0.4 05/25/2020     Microbiology: Recent Results (from the past 240 hour(s))  Surgical pcr screen     Status: None   Collection Time: 06/14/20  5:39 AM   Specimen: Nasal Mucosa; Nasal Swab  Result Value Ref Range Status   MRSA, PCR NEGATIVE NEGATIVE Final   Staphylococcus aureus NEGATIVE NEGATIVE Final    Comment: (NOTE) The Xpert SA Assay (FDA approved for NASAL specimens in patients 79 years of age and older), is one component of a comprehensive surveillance program. It is not intended to diagnose infection nor to guide or monitor treatment. Performed at [facility], [address] [place], [place] [address]      Marcos Eke, NP [facility]  06/16/2020  12:56 PM

## 2020-06-16 NOTE — Progress Notes (Signed)
PT Cancellation Note  Patient Details Name: Jovaughn Wojtaszek MRN: [id number] DOB: 1991/05/05   Cancelled Treatment:    Reason Eval/Treat Not Completed: Other (comment) (Refused to work with PT due to fatigue. Pt sleeping. )   Berline Lopes 06/16/2020, 12:25 PM Mollie Rossano W,PT Acute Rehabilitation Services Pager:  [phone]  Office:  [phone]

## 2020-06-17 DIAGNOSIS — Z978 Presence of other specified devices: Secondary | ICD-10-CM

## 2020-06-17 DIAGNOSIS — J181 Lobar pneumonia, unspecified organism: Secondary | ICD-10-CM

## 2020-06-17 MED ORDER — FLUOXETINE HCL 20 MG PO CAPS: 20.0000 mg | ORAL_CAPSULE | Freq: Every day | ORAL | 3 refills | Status: DC

## 2020-06-17 MED ORDER — ZOLPIDEM TARTRATE 5 MG PO TABS: 5.0000 mg | ORAL_TABLET | Freq: Every evening | ORAL | 0 refills | Status: DC | PRN

## 2020-06-17 MED ORDER — IVABRADINE HCL 7.5 MG PO TABS
7.5000 mg | ORAL_TABLET | Freq: Two times a day (BID) | ORAL | 3 refills | Status: DC
Start: 2020-06-17 — End: 2020-08-25

## 2020-06-17 MED ORDER — HYDROXYZINE HCL 25 MG PO TABS: 25.0000 mg | ORAL_TABLET | Freq: Three times a day (TID) | ORAL | 0 refills | Status: DC | PRN

## 2020-06-17 MED ORDER — CARVEDILOL 6.25 MG PO TABS
6.2500 mg | ORAL_TABLET | Freq: Two times a day (BID) | ORAL | 3 refills | Status: DC
Start: 2020-06-17 — End: 2020-10-21

## 2020-06-17 MED ORDER — ORITAVANCIN DIPHOSPHATE 400 MG IV SOLR
1200.0000 mg | Freq: Once | INTRAVENOUS | Status: DC
  Filled 2020-06-17: qty 120

## 2020-06-17 MED ORDER — CARVEDILOL 6.25 MG PO TABS
6.2500 mg | ORAL_TABLET | Freq: Two times a day (BID) | ORAL | 3 refills | Status: DC
Start: 2020-06-17 — End: 2020-06-17

## 2020-06-17 MED ORDER — SACUBITRIL-VALSARTAN 24-26 MG PO TABS: 1.0000 | ORAL_TABLET | Freq: Two times a day (BID) | ORAL | 3 refills | Status: DC

## 2020-06-17 MED ORDER — IVABRADINE HCL 7.5 MG PO TABS
7.5000 mg | ORAL_TABLET | Freq: Two times a day (BID) | ORAL | 3 refills | Status: DC
Start: 2020-06-17 — End: 2020-06-17

## 2020-06-17 MED ORDER — SPIRONOLACTONE 25 MG PO TABS
25.0000 mg | ORAL_TABLET | Freq: Every day | ORAL | 3 refills | Status: DC
Start: 2020-06-17 — End: 2020-06-17

## 2020-06-17 MED ORDER — SPIRONOLACTONE 25 MG PO TABS
25.0000 mg | ORAL_TABLET | Freq: Every day | ORAL | 3 refills | Status: DC
Start: 2020-06-17 — End: 2020-10-21

## 2020-06-17 MED FILL — SPIRONOLACTONE 25 MG TABLET: 25 | 30 days supply | Qty: 30 | Fill #0

## 2020-06-17 MED FILL — ENTRESTO 24 MG-26 MG TABLET: 24-26 | 30 days supply | Qty: 60 | Fill #0

## 2020-06-17 MED FILL — CARVEDILOL 6.25 MG TABLET: 6.25 | 30 days supply | Qty: 60 | Fill #0

## 2020-06-17 MED FILL — ZOLPIDEM TARTRATE 5 MG TAB: 5 | 30 days supply | Qty: 30 | Fill #0

## 2020-06-17 MED FILL — hydrOXYzine HCL 25 MG TABS: 25 | 10 days supply | Qty: 30 | Fill #0

## 2020-06-17 MED FILL — FLUoxetine HCL 20 MG CAPS: 20 | 30 days supply | Qty: 30 | Fill #0

## 2020-06-17 NOTE — TOC Initial Note (Signed)
Transition of Care [facility]) - Initial/Assessment Note    Patient Details  Name: Clifford Clark MRN: [id number] Date of Birth: 1991-01-08  [facility] CM/SW Contact:    Leone Haven, RN Phone Number: 06/17/2020, 10:26 AM  Clinical Narrative:                 Patient is for dc today, per CSW [facility] will not take patient.  NCM spoke with patient, he will be going to his mom's home at discharge.  He has no money for meds.  TOC to fill meds. They will see if HF can help with some of the meds also.  He has a follow up apt at the [facility] clinic.  He states his mom will transport him home today. He states he does not want HHPT. NCM assisted  With [facility].  Expected Discharge Plan: Home/Self Care Barriers to Discharge: No Barriers Identified   Patient Goals and CMS Choice Patient states their goals for this hospitalization and ongoing recovery are:: get better   Choice offered to / list presented to : NA  Expected Discharge Plan and Services Expected Discharge Plan: Home/Self Care   Discharge Planning Services: CM Consult Post Acute Care Choice: NA Living arrangements for the past 2 months: Single Family Home Expected Discharge Date: 06/17/20                 DME Agency: NA       HH Arranged: NA          Prior Living Arrangements/Services Living arrangements for the past 2 months: Single Family Home Lives with:: Spouse Patient language and need for interpreter reviewed:: Yes Do you feel safe going back to the place where you live?: Yes      Need for Family Participation in Patient Care: Yes (Comment) Care giver support system in place?: Yes (comment)   Criminal Activity/Legal Involvement Pertinent to Current Situation/Hospitalization: No - Comment as needed  Activities of Daily Living Home Assistive Devices/Equipment: None ADL Screening (condition at time of admission) Patient's cognitive ability adequate to safely complete daily activities?: No (patient very  drowsy but answering questions) Is the patient deaf or have difficulty hearing?: No Does the patient have difficulty seeing, even when wearing glasses/contacts?: No Does the patient have difficulty concentrating, remembering, or making decisions?: Yes Patient able to express need for assistance with ADLs?: Yes Does the patient have difficulty dressing or bathing?: Yes Independently performs ADLs?: No Communication: Independent Dressing (OT): Needs assistance Is this a change from baseline?: Change from baseline, expected to last >3 days Grooming: Needs assistance Is this a change from baseline?: Change from baseline, expected to last >3 days Feeding: Needs assistance Is this a change from baseline?: Change from baseline, expected to last >3 days Bathing: Needs assistance Is this a change from baseline?: Change from baseline, expected to last >3 days Toileting: Needs assistance Is this a change from baseline?: Change from baseline, expected to last >3days In/Out Bed: Needs assistance Is this a change from baseline?: Change from baseline, expected to last >3 days Walks in Home: Needs assistance Is this a change from baseline?: Change from baseline, expected to last >3 days Does the patient have difficulty walking or climbing stairs?: Yes Weakness of Legs: Both Weakness of Arms/Hands: Both  Permission Sought/Granted                  Emotional Assessment Appearance:: Appears stated age Attitude/Demeanor/Rapport: Engaged Affect (typically observed): Appropriate Orientation: : Oriented to  Self, Oriented to Place, Oriented to  Time, Oriented to Situation Alcohol / Substance Use: Illicit Drugs Psych Involvement: No (comment)  Admission diagnosis:  Elevated LFTs [R79.89] Sepsis (HCC) [A41.9] Endocarditis [I38] Patient Active Problem List   Diagnosis Date Noted  . Lobar pneumonia, unspecified organism (HCC)   . Acute systolic heart failure (HCC)   . Acute respiratory failure  (HCC)   . Chest pain   . Pressure injury of skin 05/08/2020  . Hand abscess 05/02/2020  . Elevated BUN 05/02/2020  . Threatening behavior 05/01/2020  . MSSA bacteremia 04/29/2020  . Malnutrition of moderate degree 04/29/2020  . Intravenous drug abuse (HCC) 04/28/2020  . Septic embolism (HCC) 04/28/2020  . Endocarditis 04/28/2020  . Sepsis (HCC) 04/28/2020  . Thrombocytopenia (HCC) 04/28/2020  . Homeless 04/28/2020  . Hypokalemia 04/28/2020  . Hyponatremia 04/28/2020  . Encounter to establish care 04/02/2019   PCP:  Patient, [name] Pharmacy:   [id number] - [place], [place] - [address] AT [facility] OF [address] [place] [address] Phone: [phone] Fax: [phone]  [facility] [place], [place] - [address] [place] [address] Phone: [phone] Fax: [phone]     Social Determinants of Health (SDOH) Interventions    Readmission Risk Interventions Readmission Risk Prevention Plan 06/17/2020  Transportation Screening Complete  Medication Review (RN Care Manager) Complete  PCP or Specialist appointment within 3-5 days of discharge Complete  HRI or Home Care Consult Complete  SW Recovery Care/Counseling Consult Complete  Palliative Care Screening Not Applicable  Skilled Nursing Facility Not Applicable  Some recent data might be hidden

## 2020-06-17 NOTE — Progress Notes (Signed)
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 s/p angiovac by CVTS 7/20. Intubated 7/23-7/28. PMHx: IVDU, asthma   OT comments  Pt. Seen for skilled OT treatment prior to d/c home later today.  Focus of session, cont. Work on hand and b Designer, industrial/product.  Pt. Able to return demo of hand/digit exercises.  Reviewed integration and use during ADL completion and lesiure tasks ie: playing and suffling cards.  Provided yellow theraputty with handout and reviewed with pt.  Note d/c home today.    Follow Up Recommendations  No OT follow up    Equipment Recommendations  None recommended by OT    Recommendations for Other Services      Precautions / Restrictions Precautions Precautions: Fall Precaution Comments: monitor HR, BP Restrictions Weight Bearing Restrictions: No       Mobility Bed Mobility                  Transfers                 General transfer comment: standing in room on entry    Balance Overall balance assessment: Needs assistance Sitting-balance support: No upper extremity supported;Feet supported Sitting balance-Leahy Scale: Good     Standing balance support: No upper extremity supported Standing balance-Leahy Scale: Good Standing balance comment: steady in static and dynamic balance                           ADL either performed or assessed with clinical judgement   ADL                                               Vision       Perception     Praxis      Cognition Arousal/Alertness: Awake/alert Behavior During Therapy: WFL for tasks assessed/performed Overall Cognitive Status: Impaired/Different from baseline Area of Impairment: Safety/judgement;Problem  solving                             Problem Solving: Requires verbal cues General Comments: pt educated on safety with discharge, not at level he was when he came into the hospital         Exercises Hand Exercises Digit Composite Flexion: AROM;10 reps Composite Extension: AROM;Both;10 reps Digit Composite Abduction: AROM;Both;10 reps Digit Composite Adduction: AROM;Both;10 reps Digit Lifts: AROM;Both;10 reps Opposition: AROM;Both;10 reps Other Exercises Other Exercises: provided yellow theraputty (pt. reports he lost the other one), also provided handout with review of exercises. discussed other ways to integrate b hand and digit use to cont. function and strength.  pt. reports increased/return of sensation to digits B hands.  staets shuffling of cards abilities have increased.  reviewed use of hands for opening containers, turning pages, ect.   Shoulder Instructions       General Comments HR in 120s with ambulation/stair climb    Pertinent Vitals/ Pain       Pain Assessment: No/denies pain  Home Living  Prior Functioning/Environment              Frequency  Min 1X/week        Progress Toward Goals  OT Goals(current goals can now be found in the care plan section)  Progress towards OT goals: Progressing toward goals  Acute Rehab OT Goals Patient Stated Goal: return to independence/drug rehab  Plan      Co-evaluation                 AM-PAC OT "6 Clicks" Daily Activity     Outcome Measure   Help from another person eating meals?: None Help from another person taking care of personal grooming?: A Little Help from another person toileting, which includes using toliet, bedpan, or urinal?: A Little Help from another person bathing (including washing, rinsing, drying)?: A Little Help from another person to put on and taking off regular upper body clothing?: A Little Help from another  person to put on and taking off regular lower body clothing?: A Little 6 Click Score: 19    End of Session    OT Visit Diagnosis: Muscle weakness (generalized) (M62.81);Other symptoms and signs involving cognitive function Pain - part of body: Hand   Activity Tolerance Patient tolerated treatment well   Patient Left Other (comment) (standing in room)   Nurse Communication          Time: 6256-3893 OT Time Calculation (min): 10 min  Charges: OT General Charges $OT Visit: 1 Visit OT Treatments $Therapeutic Exercise: 8-22 mins  Boneta Lucks, COTA/L Acute Rehabilitation [phone]   Robet Leu 06/17/2020, 11:49 AM

## 2020-06-17 NOTE — Progress Notes (Signed)
D/C instructions given and reviewed. No questions voiced at this time. Encouraged to call with any concerns. Awaiting AM meds and TOC pharmacy delivery.

## 2020-06-17 NOTE — Plan of Care (Signed)
  Problem: Education: Goal: Knowledge of General Education information will improve Description: Including pain rating scale, medication(s)/side effects and non-pharmacologic comfort measures Outcome: Adequate for Discharge   Problem: Health Behavior/Discharge Planning: Goal: Ability to manage health-related needs will improve Outcome: Adequate for Discharge   Problem: Clinical Measurements: Goal: Ability to maintain clinical measurements within normal limits will improve Outcome: Adequate for Discharge Goal: Will remain free from infection Outcome: Adequate for Discharge Goal: Diagnostic test results will improve Outcome: Adequate for Discharge Goal: Respiratory complications will improve Outcome: Adequate for Discharge Goal: Cardiovascular complication will be avoided Outcome: Adequate for Discharge   Problem: Activity: Goal: Risk for activity intolerance will decrease Outcome: Adequate for Discharge   Problem: Nutrition: Goal: Adequate nutrition will be maintained Outcome: Adequate for Discharge   Problem: Coping: Goal: Level of anxiety will decrease Outcome: Adequate for Discharge   Problem: Elimination: Goal: Will not experience complications related to bowel motility Outcome: Adequate for Discharge Goal: Will not experience complications related to urinary retention Outcome: Adequate for Discharge   Problem: Pain Managment: Goal: General experience of comfort will improve Outcome: Adequate for Discharge   Problem: Safety: Goal: Ability to remain free from injury will improve Outcome: Adequate for Discharge   Problem: Skin Integrity: Goal: Risk for impaired skin integrity will decrease Outcome: Adequate for Discharge   Problem: Education: Goal: Ability to demonstrate management of disease process will improve Outcome: Adequate for Discharge Goal: Ability to verbalize understanding of medication therapies will improve Outcome: Adequate for Discharge    Problem: Activity: Goal: Capacity to carry out activities will improve Outcome: Adequate for Discharge   Problem: Cardiac: Goal: Ability to achieve and maintain adequate cardiopulmonary perfusion will improve Outcome: Adequate for Discharge   

## 2020-06-17 NOTE — Progress Notes (Signed)
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 s/p angiovac by CVTS 7/20. Intubated 7/23-7/28. PMHx: IVDU, asthma    PT Comments    Pt looking forward to discharge home to his mother's home today. Pt will only have 4 steps to navigate at this house. Focus of session on energy conservation, safety awareness with current level of performance in respect to prior to hospitalization. Pt is independent in ambulation without AD and supervision for ascent/descent of flight of stairs. Educated on what it feels like for acceptable rise in HR with activity. Pt reports understanding. Pt would benefit from HHPT however pt is refusing.    Follow Up Recommendations  Home health PT;Supervision/Assistance - 24 hour     Equipment Recommendations  None recommended by PT    Recommendations for Other Services OT consult     Precautions / Restrictions Precautions Precautions: Fall Precaution Comments: monitor HR, BP Restrictions Weight Bearing Restrictions: No    Mobility  Bed Mobility                  Transfers                 General transfer comment: standing in room on entry  Ambulation/Gait Ambulation/Gait assistance: Independent Gait Distance (Feet): 500 Feet Assistive device: None Gait Pattern/deviations: Step-through pattern;Decreased stride length Gait velocity: decreased   General Gait Details: ambulates with slow, steady gait   Stairs   Stairs assistance: Supervision Stair Management: One rail Right;Step to pattern;Forwards Number of Stairs: 20 General stair comments: Educated on need for assessing cardiovascular response to increased activity and resting to allow heart to recover even if that means stopping in the  middle of the flight of stairs   Wheelchair Mobility    Modified Rankin (Stroke Patients Only)       Balance Overall balance assessment: Needs assistance Sitting-balance support: No upper extremity supported;Feet supported Sitting balance-Leahy Scale: Good     Standing balance support: No upper extremity supported Standing balance-Leahy Scale: Good Standing balance comment: steady in static and dynamic balance                            Cognition Arousal/Alertness: Awake/alert Behavior During Therapy: WFL for tasks assessed/performed Overall Cognitive Status: Impaired/Different from baseline Area of Impairment: Safety/judgement;Problem solving                             Problem Solving: Requires verbal cues General Comments: pt educated on safety with discharge, not at level he was when he came into the hospital       Exercises      General Comments General comments (skin integrity, edema, etc.): HR in 120s with ambulation/stair climb      Pertinent Vitals/Pain Pain Assessment: No/denies pain           PT Goals (current goals can now be found in the care plan section) Acute Rehab PT Goals Patient Stated Goal: return to independence/drug rehab PT Goal Formulation: With patient Time For Goal Achievement: 06/16/20 Potential to Achieve Goals: Good Progress towards PT goals: Progressing toward goals    Frequency    Min 2X/week  PT Plan Current plan remains appropriate       AM-PAC PT "6 Clicks" Mobility   Outcome Measure  Help needed turning from your back to your side while in a flat bed without using bedrails?: None Help needed moving from lying on your back to sitting on the side of a flat bed without using bedrails?: None Help needed moving to and from a bed to a chair (including a wheelchair)?: None Help needed standing up from a chair using your arms (e.g., wheelchair or bedside chair)?: None Help needed to walk in  hospital room?: None Help needed climbing 3-5 steps with a railing? : None 6 Click Score: 24    End of Session   Activity Tolerance: Patient tolerated treatment well Patient left: with nursing/sitter in room;Other (comment) (standing in room with nursing staff) Nurse Communication: Mobility status PT Visit Diagnosis: Other abnormalities of gait and mobility (R26.89);Difficulty in walking, not elsewhere classified (R26.2)     Time: 2542-7062 PT Time Calculation (min) (ACUTE ONLY): 12 min  Charges:  $Gait Training: 8-22 mins                     Clifford Clark PT, DPT Acute Rehabilitation Services Pager [phone] Office [phone]    Elon Alas Fleet 06/17/2020, 10:58 AM

## 2020-06-17 NOTE — TOC Transition Note (Addendum)
Transition of Care (TOC) - CM/SW Discharge Note Patient is for dc today, per CSW [facility] will not take patient.  NCM spoke with patient, he will be going to his mom's home at discharge.  He has no money for meds.  TOC to fill meds. They will see if HF can help with some of the meds also.  He has a follow up apt at the [facility].  He states his mom will transport him home today. He states he does not want HHPT. NCM assisted with [employer].  Patient Details  Name: Clifford Clark MRN: [id number] Date of Birth: [birth year]  Transition of Care [facility]) CM/SW Contact:  Leone Haven, RN Phone Number: 06/17/2020, 9:22 AM   Clinical Narrative:       Final next level of care: Home/Self Care Barriers to Discharge: No Barriers Identified   Patient Goals and CMS Choice Patient states their goals for this hospitalization and ongoing recovery are:: get better      Discharge Placement                       Discharge Plan and Services                  DME Agency: NA       HH Arranged: NA          Social Determinants of Health (SDOH) Interventions     Readmission Risk Interventions No flowsheet data found.

## 2020-06-17 NOTE — Discharge Summary (Addendum)
Physician Discharge Summary  Clifford Clark NWG:[id number] DOB: November 07, 1990 DOA: 04/28/2020  PCP: Patient, [name]  Admit date: 04/28/2020 Discharge date: 06/17/2020  Admitted From: Home Disposition:  Home  Recommendations for Outpatient Follow-up:  1. Follow up with PCP in 1-2 weeks 2. Please obtain BMP/CBC in one week 3. We will follow up with cardiology in 4 weeks.  Home Health:No Equipment/Devices:None  Discharge Condition:Stable CODE STATUS:Full Diet recommendation: Heart Healthy   Brief/Interim Summary: 28 y.o. 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  2D echo showed an EF of 60% with a PFO and a large vegetation in the tricuspid valve.  He underwent AngioJet removal of part of the vegetation,repeat a 2D echo showed an EF of 20%.  Extubated on 05/19/2020.  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 He is status post debulking with angio vac on 05/10/2020 due to  large vegetation done by CT surgery. Repeated 2D echo on 05/19/2020 showed an EF of 30% with persistent tricuspid vegetation with severe tricuspid regurgitation. ID recommended dual anti-MRSA treatment with daptomycin and doxycycline which he completed his course in house until 06/17/2020. Dental was consulted and due to his extensive dental cavities which are currently asymptomatic they recommended extractions which was done. The patient was supposed to be discharged to an inpatient rehab which he was denied.  Acute respiratory failure with hypoxia: On admission he was intubated and extubated on 05/18/2020 has remained stable.  Septic shock requiring pressors: Secondary to MRSA bacteremia, he was started empirically on antibiotics wean off pressors and transferred to the floor on 05/20/2020. His CK on daptomycin remained stable he completed his course of IV antibiotics in house.  Acute systolic heart failure: Initial 2D echo on admission showed an EF of 60% repeated on 05/14/2020 showed an EF of 20% he was started on IV Lasix with good diuresis. The advanced heart failure team was consulted he was started on Coreg, and Corlanor and Entresto and Aldactone with nice diuresis. Will need valve replacement which he will be further evaluated as an outpatient and he has a follow-up appointment in the heart failure clinic as an outpatient.  Normocytic anemia: No signs of overt bleeding likely due to infectious etiology has remained stable and improving throughout his hospital stay.  Hypokalemia: Resolved with repletion and starting of Aldactone.  Substance disorder: He was counseled he wanted to go to [facility] inpatient rehab but he was denied.  He will follow up as an outpatient  rehab center.  Bilateral wrist abscesses: Orthopedic surgery was consulted and they were I&D on 05/03/2020 that grew MSSA.  Moderate caloric malnutrition: Continue Ensure 3 times daily.   Discharge  Instructions  Discharge Instructions    Diet - low sodium heart healthy   Complete by: As directed    Increase activity slowly   Complete by: As directed      Allergies as of 06/17/2020   No Known Allergies     Medication List    STOP taking these medications   naloxone 0.4 MG/ML injection Commonly known as: NARCAN     TAKE these medications   carvedilol 6.25 MG tablet Commonly known as: COREG Take 1 tablet (6.25 mg total) by mouth 2 (two) times daily with a meal.   doxycycline 100 MG tablet Commonly known as: VIBRA-TABS Take 1 tablet (100 mg total) by mouth every 12 (twelve) hours for 21 days. Fill quantity to treat through 07/05/2020   FLUoxetine 20 MG capsule Commonly known as: PROZAC Take 1 capsule (20 mg total) by mouth daily.   hydrOXYzine 25 MG tablet Commonly known as: ATARAX/VISTARIL Take 1 tablet (25 mg total) by mouth 3 (three) times daily as needed for itching or anxiety.   ivabradine 7.5 MG Tabs tablet Commonly known as: CORLANOR Take 1 tablet (7.5 mg total) by mouth 2 (two) times daily with a meal.   sacubitril-valsartan 24-26 MG Commonly known as: ENTRESTO Take 1 tablet by mouth 2 (two) times daily.   spironolactone 25 MG tablet Commonly known as: ALDACTONE Take 1 tablet (25 mg total) by mouth daily.   zolpidem 5 MG tablet Commonly known as: Ambien Take 1 tablet (5 mg total) by mouth at bedtime as needed for sleep.       Follow-up Information    [facility]. Go on 07/04/2020.   Why: at 2:30pm for hospital followup Contact information: [address] [place] [address] [phone]       [facility] Follow up on 07/11/2020.   Specialty: Cardiology Why: 2:00 PM  Advanced Heart Failure Clinic Parking Garage Code [phone]  Contact information: [address] [id number] [place] [address] [phone]       Call [name], DDS.   Specialty: Dentistry Why: Call for follow-up evaluation of healing and suture removal once discharged from the hospital. Contact information: [address] [place] [address] [phone]              No Known Allergies  Consultations:  CT surgery  Advanced heart failure team  Pulmonary and critical care  Orthopedic surgery   Procedures/Studies: DG Orthopantogram  Result Date: 06/08/2020 CLINICAL DATA:  Tricuspid valve endocarditis with concern for dental etiology for infection EXAM: ORTHOPANTOGRAM/PANORAMIC COMPARISON:  None. FINDINGS: There is extensive cavity involving the right superior first molar with lucency in the root region, likely of infectious etiology. Extensive cavity noted in the right inferior premolar. Several teeth are missing. No bony destruction. No fracture or dislocation IMPRESSION: Carious changes, most notable in the superior right first molar and inferior right premolar. Several teeth missing. No bony destruction. Electronically Signed   By: Bretta Bang III M.D.   On: 06/08/2020 08:54   ECHOCARDIOGRAM LIMITED  Result Date: 06/08/2020    ECHOCARDIOGRAM LIMITED REPORT   Patient Name:   Clifford Clark Date of Exam: 06/08/2020 Medical Rec #:  [id number]     Height:  67.0 in Accession #:    [id number]    Weight:       131.5 lb Date of Birth:  1991-03-07     BSA:          1.692 m Patient Age:    29 years      BP:           107/64 mmHg Patient Gender: M             HR:           89 bpm. Exam Location:  Inpatient Procedure: Limited Echo, Limited Color Doppler and Cardiac Doppler Indications:    Tricuspid Valve Disease I07.9  History:        Patient has prior history of Echocardiogram examinations, most                 recent 05/19/2020.  Sonographer:    Thurman Coyerasey Kirkpatrick RDCS (AE) Referring Phys: [id number] DANIEL R BENSIMHON IMPRESSIONS  1. Large vegetatons persist on multiple leaflets with sevee TR. No change in size since echo done  05/19/20 pericardial effuison seems trivial now . The tricuspid valve is abnormal. Severe tricuspid stenosis.  2. There is normal pulmonary artery systolic pressure. FINDINGS  Right Ventricle: There is normal pulmonary artery systolic pressure. The tricuspid regurgitant velocity is 2.50 m/s, and with an assumed right atrial pressure of 8 mmHg, the estimated right ventricular systolic pressure is 33.0 mmHg. Tricuspid Valve: Large vegetatons persist on multiple leaflets with sevee TR. No change in size since echo done 05/19/20 pericardial effuison seems trivial now. The tricuspid valve is abnormal. Severe tricuspid stenosis. TRICUSPID VALVE TR Peak grad:   25.0 mmHg TR Vmax:        250.00 cm/s Charlton HawsPeter Nishan MD Electronically signed by Charlton HawsPeter Nishan MD Signature Date/Time: 06/08/2020/2:00:11 PM    Final    ECHOCARDIOGRAM LIMITED  Result Date: 05/19/2020    ECHOCARDIOGRAM LIMITED REPORT   Patient Name:   Clifford Clark Date of Exam: 05/19/2020 Medical Rec #:  [id number]     Height:       67.0 in Accession #:    [id number]    Weight:       120.4 lb Date of Birth:  1991-03-07     BSA:          1.630 m Patient Age:    29 years      BP:           129/86 mmHg Patient Gender: M             HR:           96 bpm. Exam Location:  Inpatient Procedure: Limited Echo, Cardiac Doppler, Color Doppler and 3D Echo Indications:    I50.21 Acute systolic (congestive) heart failure  History:        Patient has prior history of Echocardiogram examinations, most                 recent 05/14/2020. Abnormal ECG, Endocarditis;                 Signs/Symptoms:Chest Pain. Septic emboli. IVDU. Respiratory                 failure.  Sonographer:    Sheralyn Boatmanina West RDCS Referring Phys: [id number] Roxy HorsemanBRITTAINY M SIMMONS  Sonographer Comments: Technically difficult study due to poor echo windows. This is a limited study. IMPRESSIONS  1. Left ventricular ejection fraction, by estimation, is 30 to 35%. The left ventricle has  moderately decreased function. The left ventricle  demonstrates global hypokinesis. There is mild left ventricular hypertrophy.  2. Right ventricular systolic function is normal. The right ventricular size is normal. There is normal pulmonary artery systolic pressure. The estimated right ventricular systolic pressure is 30.5 mmHg.  3. The mitral valve is normal in structure. Trivial mitral valve regurgitation.  4. Tricuspid valve vegetations are seen, measuring up to 1.8 x 0.8 cm attached to posterior leaflet and up to 1.8 x 1.0 cm attached to anterior leaflet. The tricuspid valve is abnormal. Tricuspid valve regurgitation is severe.  5. The aortic valve is tricuspid. Aortic valve regurgitation is not visualized. No aortic stenosis is present.  6. Small pericardial effusion  7. The inferior vena cava is normal in size with <50% respiratory variability, suggesting right atrial pressure of 8 mmHg. FINDINGS  Left Ventricle: Left ventricular ejection fraction, by estimation, is 30 to 35%. The left ventricle has moderately decreased function. The left ventricle demonstrates global hypokinesis. The left ventricular internal cavity size was normal in size. There is mild left ventricular hypertrophy. Right Ventricle: The right ventricular size is normal. Right ventricular systolic function is normal. There is normal pulmonary artery systolic pressure. The tricuspid regurgitant velocity is 2.37 m/s, and with an assumed right atrial pressure of 8 mmHg,  the estimated right ventricular systolic pressure is 30.5 mmHg. Pericardium: A small pericardial effusion is present. Mitral Valve: The mitral valve is normal in structure. Trivial mitral valve regurgitation. Tricuspid Valve: Tricuspid valve vegetations are seen, measuring up to 1.8 x 0.8 cm attached to posterior leaflet and up to 1.8 x 1.0 cm attached to anterior leaflet. The tricuspid valve is abnormal. Tricuspid valve regurgitation is severe. Aortic Valve: The aortic valve is tricuspid. Aortic valve regurgitation is not  visualized. No aortic stenosis is present. Aorta: The aortic root is normal in size and structure. Venous: The inferior vena cava is normal in size with less than 50% respiratory variability, suggesting right atrial pressure of 8 mmHg. LEFT VENTRICLE PLAX 2D LVIDd:         4.70 cm     Diastology LVIDs:         3.90 cm     LV e' lateral:   10.73 cm/s LV PW:         1.40 cm     LV E/e' lateral: 4.2 LV IVS:        0.90 cm     LV e' medial:    8.05 cm/s                            LV E/e' medial:  5.6  LV Volumes (MOD) LV vol d, MOD A4C: 91.5 ml LV vol s, MOD A4C: 51.4 ml LV SV MOD A4C:     91.5 ml RIGHT VENTRICLE             IVC RV S prime:     16.50 cm/s  IVC diam: 1.90 cm LEFT ATRIUM         Index LA diam:    3.40 cm 2.09 cm/m   AORTA Ao Root diam: 3.70 cm MITRAL VALVE               TRICUSPID VALVE MV Area (PHT): 4.21 cm    TR Peak grad:   22.5 mmHg MV Decel Time: 180 msec    TR Vmax:        237.00 cm/s MV E velocity:  45.40 cm/s MV A velocity: 72.00 cm/s MV E/A ratio:  0.63 Epifanio Lesches MD Electronically signed by Epifanio Lesches MD Signature Date/Time: 05/19/2020/3:54:47 PM    Final     Subjective: No complaints feels great.  Discharge Exam: Vitals:   06/17/20 0735 06/17/20 0950  BP: (!) 94/58 107/66  Pulse: 78 (!) 107  Resp: 18   Temp: 98.3 F (36.8 C)   SpO2: 100%    Vitals:   06/16/20 2341 06/17/20 0444 06/17/20 0735 06/17/20 0950  BP: (!) 99/50 98/63 (!) 94/58 107/66  Pulse: 85 87 78 (!) 107  Resp: 19 18 18    Temp: 97.9 F (36.6 C) 97.8 F (36.6 C) 98.3 F (36.8 C)   TempSrc: Oral Oral Oral   SpO2: 99% 99% 100%   Weight:      Height:        General: Pt is alert, awake, not in acute distress Cardiovascular: RRR, S1/S2 +, no rubs, no gallops Respiratory: CTA bilaterally, no wheezing, no rhonchi Abdominal: Soft, NT, ND, bowel sounds + Extremities: no edema, no cyanosis    The results of significant diagnostics from this hospitalization (including imaging,  microbiology, ancillary and laboratory) are listed below for reference.     Microbiology: Recent Results (from the past 240 hour(s))  Surgical pcr screen     Status: None   Collection Time: 06/14/20  5:39 AM   Specimen: Nasal Mucosa; Nasal Swab  Result Value Ref Range Status   MRSA, PCR NEGATIVE NEGATIVE Final   Staphylococcus aureus NEGATIVE NEGATIVE Final    Comment: (NOTE) The Xpert SA Assay (FDA approved for NASAL specimens in patients 53 years of age and older), is one component of a comprehensive surveillance program. It is not intended to diagnose infection nor to guide or monitor treatment. Performed at [facility], [address], [place], [place]      Labs: BNP (last 3 results) No results for input(s): BNP in the last 8760 hours. Basic Metabolic Panel: Recent Labs  Lab 06/13/20 1215 06/14/20 1436 06/15/20 0656 06/16/20 0750  NA 138 135 139 139  K 4.4 4.8 4.4 4.0  CL 101 101 103 100  CO2 25 24 27 28   GLUCOSE 82 266* 104* 90  BUN 27* 27* 21* 23*  CREATININE 0.68 0.89 0.68 0.67  CALCIUM 10.2 9.7 9.6 10.3   Liver Function Tests: No results for input(s): AST, ALT, ALKPHOS, BILITOT, PROT, ALBUMIN in the last 168 hours. No results for input(s): LIPASE, AMYLASE in the last 168 hours. No results for input(s): AMMONIA in the last 168 hours. CBC: Recent Labs  Lab 06/15/20 1148  WBC 12.4*  HGB 11.1*  HCT 36.4*  MCV 93.3  PLT 333   Cardiac Enzymes: Recent Labs  Lab 06/15/20 0656  CKTOTAL 27*   BNP: Invalid input(s): POCBNP CBG: No results for input(s): GLUCAP in the last 168 hours. D-Dimer No results for input(s): DDIMER in the last 72 hours. Hgb A1c No results for input(s): HGBA1C in the last 72 hours. Lipid Profile No results for input(s): CHOL, HDL, LDLCALC, TRIG, CHOLHDL, LDLDIRECT in the last 72 hours. Thyroid function studies No results for input(s): TSH, T4TOTAL, T3FREE, THYROIDAB in the last 72 hours.  Invalid input(s):  FREET3 Anemia work up No results for input(s): VITAMINB12, FOLATE, FERRITIN, TIBC, IRON, RETICCTPCT in the last 72 hours. Urinalysis    Component Value Date/Time   COLORURINE YELLOW 05/13/2020 2050   APPEARANCEUR HAZY (A) 05/13/2020 2050   LABSPEC 1.035 (  H) 05/13/2020 2050   PHURINE 5.0 05/13/2020 2050   GLUCOSEU 50 (A) 05/13/2020 2050   HGBUR LARGE (A) 05/13/2020 2050   BILIRUBINUR NEGATIVE 05/13/2020 2050   KETONESUR NEGATIVE 05/13/2020 2050   PROTEINUR 100 (A) 05/13/2020 2050   NITRITE NEGATIVE 05/13/2020 2050   LEUKOCYTESUR NEGATIVE 05/13/2020 2050   Sepsis Labs Invalid input(s): PROCALCITONIN,  WBC,  LACTICIDVEN Microbiology Recent Results (from the past 240 hour(s))  Surgical pcr screen     Status: None   Collection Time: 06/14/20  5:39 AM   Specimen: Nasal Mucosa; Nasal Swab  Result Value Ref Range Status   MRSA, PCR NEGATIVE NEGATIVE Final   Staphylococcus aureus NEGATIVE NEGATIVE Final    Comment: (NOTE) The Xpert SA Assay (FDA approved for NASAL specimens in patients 73 years of age and older), is one component of a comprehensive surveillance program. It is not intended to diagnose infection nor to guide or monitor treatment. Performed at [facility], [address], [place], [place] [address]      Time coordinating discharge: Over 40 minutes  SIGNED:   Marinda Elk, MD  [facility] 06/17/2020, 11:08 AM Pager   If 7PM-7AM, please contact night-coverage [email or web address] Password [email or web address]

## 2020-06-21 ENCOUNTER — Ambulatory Visit (HOSPITAL_COMMUNITY): Payer: Self-pay | Admitting: Dentistry

## 2020-06-21 ENCOUNTER — Encounter (HOSPITAL_COMMUNITY): Payer: Self-pay | Admitting: Dentistry

## 2020-06-21 ENCOUNTER — Other Ambulatory Visit: Payer: Self-pay

## 2020-06-21 VITALS — BP 103/66 | HR 85 | Temp 98.3°F

## 2020-06-21 DIAGNOSIS — K08199 Complete loss of teeth due to other specified cause, unspecified class: Secondary | ICD-10-CM

## 2020-06-21 DIAGNOSIS — K029 Dental caries, unspecified: Secondary | ICD-10-CM

## 2020-06-21 DIAGNOSIS — I38 Endocarditis, valve unspecified: Secondary | ICD-10-CM

## 2020-06-21 MED ORDER — AMOXICILLIN 500 MG PO CAPS: ORAL_CAPSULE | ORAL | 3 refills | Status: DC

## 2020-06-21 NOTE — Progress Notes (Signed)
POST OPERATIVE NOTE:  06/21/2020 Clifford Clark [id number]  COVID 19 SCREENING: The patient does not symptoms concerning for COVID-19 infection (Including fever, chills, cough, or new SHORTNESS OF BREATH).    VITALS: BP 103/66 (BP Location: Right Arm)   Pulse 85   Temp 98.3 F (36.8 C)   LABS:  Lab Results  Component Value Date   WBC 12.4 (H) 06/15/2020   HGB 11.1 (L) 06/15/2020   HCT 36.4 (L) 06/15/2020   MCV 93.3 06/15/2020   PLT 333 06/15/2020   BMET    Component Value Date/Time   NA 139 06/16/2020 0750   K 4.0 06/16/2020 0750   CL 100 06/16/2020 0750   CO2 28 06/16/2020 0750   GLUCOSE 90 06/16/2020 0750   BUN 23 (H) 06/16/2020 0750   CREATININE 0.67 06/16/2020 0750   CALCIUM 10.3 06/16/2020 0750   GFRNONAA >60 06/16/2020 0750   GFRAA >60 06/16/2020 0750    Lab Results  Component Value Date   INR 1.4 (H) 05/15/2020   INR 1.4 (H) 04/28/2020   No results found for: PTT   Clifford Clark is status post multiple dental extractions with alveoloplasty and gross debridement of remaining dentition in the operating room with general anesthesia on 06/14/2020.  The patient now presents for evaluation of healing and suture removal.  Patient was recently discharged on 06/17/2020  SUBJECTIVE: Patient denies significant discomfort.  Patient denies any active bleeding.  Patient indicates that sutures are still intact.  EXAM: There is no sign of infection, heme, or ooze.  Sutures are loosely intact.  Extraction sites are healing in by secondary intention.  Multiple dental caries are still present with need for follow-up with primary dentist for dental restorations as indicated.  PROCEDURE: The patient was given a chlorhexidine gluconate rinse for 30 seconds. Sutures were then removed without complication. Patient tolerated the procedure well.  ASSESSMENT: Post operative course is consistent with dental procedures performed in the operating room with general anesthesia Loss of  teeth due to extraction Rampant dental caries  PLAN: 1.  Continue salt water rinses every 2 hours while awake to aid healing. 2.  Follow-up with primary dentist for continued dental restorations and evaluation for replacement missing teeth as indicated. 3.  Patient will require antibiotic premedication prior to invasive dental procedures due to history of endocarditis as per American Heart Association guidelines.  Per discussion with infectious disease, Dr. Judyann Munson, I will prescribe amoxicillin 500 mg.  Dispense 4 capsules.  Patient is to take 4 capsules 1 hour prior to invasive dental procedures.  This prescription was sent to [facility] pharmacy with 3 refills.   [name], DDS

## 2020-06-21 NOTE — Patient Instructions (Addendum)
PLAN: 1.  Continue salt water rinses every 2 hours while awake to aid healing. 2.  Follow-up with primary dentist for continued dental restorations and evaluation for replacement missing teeth as indicated. 3.  Patient will require antibiotic premedication prior to invasive dental procedures due to history of endocarditis as per American Heart Association guidelines.  Per discussion with infectious disease, Dr. Ilsa Iha, I will prescribe amoxicillin 500 mg.  Dispense 4 capsules.  Patient is to take 4 capsules 1 hour prior to invasive dental procedures.  This prescription was sent to [facility] pharmacy with 3 refills.   [name], DDS

## 2020-06-24 ENCOUNTER — Telehealth (HOSPITAL_COMMUNITY): Payer: Self-pay | Admitting: Pharmacy Technician

## 2020-06-24 NOTE — Telephone Encounter (Addendum)
Have been attempting to call patient's phone numbers listed in Epic this week and have been unsuccessful in reaching him. Called moms phone number today and was able to reach the patient. His other numbers are not working.   [employer] is requiring income documentation and he has none. Will call [employer] on Tuesday to see if a no income letter from the office will suffice and call the patient back on his moms phone.  Of note, patient was approved to receive Entresto from [employer]. Gave them their phone number. I explained to them (mom and patient), that they have likely already reached out to his old number and they should call and update the information. The patient said he was going to call when we got off the phone.  Will follow up.

## 2020-06-28 LAB — ACID FAST CULTURE WITH REFLEXED SENSITIVITIES (MYCOBACTERIA): Acid Fast Culture: NEGATIVE

## 2020-06-29 NOTE — Telephone Encounter (Signed)
Pt mom called outpatient pharmacy trying to figure out medications for son.  I read the medication phone notes in chart from HF clinic pharmacist and technician trying to reach her son regarding medication. I gave patient HF clinic phone number and she will call that #  Leota Sauers Pharm.D. CPP, BCPS Clinical Pharmacist [phone] 06/29/2020 3:24 PM

## 2020-06-30 ENCOUNTER — Other Ambulatory Visit: Payer: Self-pay

## 2020-06-30 ENCOUNTER — Ambulatory Visit (INDEPENDENT_AMBULATORY_CARE_PROVIDER_SITE_OTHER): Payer: Self-pay | Admitting: Internal Medicine

## 2020-06-30 ENCOUNTER — Encounter: Payer: Self-pay | Admitting: Internal Medicine

## 2020-06-30 VITALS — BP 116/76 | HR 87 | Wt 141.0 lb

## 2020-06-30 DIAGNOSIS — I079 Rheumatic tricuspid valve disease, unspecified: Secondary | ICD-10-CM

## 2020-06-30 NOTE — Progress Notes (Signed)
RFV: follow up for mrsa endocarditis and pna  Patient ID: Clifford Clark, male   DOB: June 11, 1991, 29 y.o.   MRN: [id number]  HPI Clifford Clark is a 29yo M with history of MRSA TV endocarditis c/b severe TR, tricuspid valve stenosis, pulmonary involvement. Currently on doxycycline. Has not had follow up with cardiology as of yet. Has upcoming appt with dental for which he continues to follow up in anticipation for heart valve replacement. He is doing 3 times per week drug treatment program. Has also been seeking to see appt with counselor to discuss anxiety. He is not yet taking fluoxetine.  Outpatient Encounter Medications as of 06/30/2020  Medication Sig  . amoxicillin (AMOXIL) 500 MG capsule Take four capsules one hour before dental appointment.  . carvedilol (COREG) 6.25 MG tablet Take 1 tablet (6.25 mg total) by mouth 2 (two) times daily with a meal.  . doxycycline (VIBRA-TABS) 100 MG tablet Take 1 tablet (100 mg total) by mouth every 12 (twelve) hours for 21 days. Fill quantity to treat through 07/05/2020  . hydrOXYzine (ATARAX/VISTARIL) 25 MG tablet Take 1 tablet (25 mg total) by mouth 3 (three) times daily as needed for itching or anxiety.  . ivabradine (CORLANOR) 7.5 MG TABS tablet Take 1 tablet (7.5 mg total) by mouth 2 (two) times daily with a meal.  . sacubitril-valsartan (ENTRESTO) 24-26 MG Take 1 tablet by mouth 2 (two) times daily.  Marland Kitchen spironolactone (ALDACTONE) 25 MG tablet Take 1 tablet (25 mg total) by mouth daily.  Marland Kitchen zolpidem (AMBIEN) 5 MG tablet Take 1 tablet (5 mg total) by mouth at bedtime as needed for sleep.  Marland Kitchen FLUoxetine (PROZAC) 20 MG capsule Take 1 capsule (20 mg total) by mouth daily. (Patient not taking: Reported on 06/30/2020)   No facility-administered encounter medications on file as of 06/30/2020.     Patient Active Problem List   Diagnosis Date Noted  . Lobar pneumonia, unspecified organism (HCC)   . Acute systolic heart failure (HCC)   . Acute respiratory failure (HCC)     . Chest pain   . Pressure injury of skin 05/08/2020  . Hand abscess 05/02/2020  . Elevated BUN 05/02/2020  . Threatening behavior 05/01/2020  . MSSA bacteremia 04/29/2020  . Malnutrition of moderate degree 04/29/2020  . Intravenous drug abuse (HCC) 04/28/2020  . Septic embolism (HCC) 04/28/2020  . Endocarditis 04/28/2020  . Sepsis (HCC) 04/28/2020  . Thrombocytopenia (HCC) 04/28/2020  . Homeless 04/28/2020  . Hypokalemia 04/28/2020  . Hyponatremia 04/28/2020  . Encounter to establish care 04/02/2019     Health Maintenance Due  Topic Date Due  . COVID-19 Vaccine (1) Never done  . TETANUS/TDAP  Never done  . INFLUENZA VACCINE  Never done     Review of Systems Review of Systems  Constitutional: Negative for fever, chills, diaphoresis, activity change, appetite change, fatigue and unexpected weight change.  HENT: Negative for congestion, sore throat, rhinorrhea, sneezing, trouble swallowing and sinus pressure.  Eyes: Negative for photophobia and visual disturbance.  Respiratory: Negative for cough, chest tightness, shortness of breath, wheezing and stridor.  Cardiovascular: Negative for chest pain, palpitations and leg swelling.  Gastrointestinal: Negative for nausea, vomiting, abdominal pain, diarrhea, constipation, blood in stool, abdominal distention and anal bleeding.  Genitourinary: Negative for dysuria, hematuria, flank pain and difficulty urinating.  Musculoskeletal: Negative for myalgias, back pain, joint swelling, arthralgias and gait problem.  Skin: Negative for color change, pallor, rash and wound.  Neurological: Negative for dizziness, tremors, weakness and light-headedness.  Hematological:  Negative for adenopathy. Does not bruise/bleed easily.  Psychiatric/Behavioral: Negative for behavioral problems, confusion, sleep disturbance, dysphoric mood, decreased concentration and agitation.    Physical Exam   BP 116/76   Pulse 87   Wt 141 lb (64 kg)   SpO2 99%    BMI 22.08 kg/m   Physical Exam  Constitutional: He is oriented to person, place, and time. He appears well-developed and well-nourished. No distress.  HENT:  Mouth/Throat: Oropharynx is clear and moist. No oropharyngeal exudate.  Cardiovascular: Normal rate, regular rhythm and normal heart sounds. Exam reveals no gallop and no friction rub.  No murmur heard.  Pulmonary/Chest: Effort normal and breath sounds normal. No respiratory distress.+murmur Abdominal: Soft. Bowel sounds are normal. He exhibits no distension. There is no tenderness.  Lymphadenopathy:  He has no cervical adenopathy.  Neurological: He is alert and oriented to person, place, and time.  Skin: Skin is warm and dry. No rash noted. No erythema.  Psychiatric: He has a normal mood and affect. His behavior is normal.    CBC Lab Results  Component Value Date   WBC 12.4 (H) 06/15/2020   RBC 3.90 (L) 06/15/2020   HGB 11.1 (L) 06/15/2020   HCT 36.4 (L) 06/15/2020   PLT 333 06/15/2020   MCV 93.3 06/15/2020   MCH 28.5 06/15/2020   MCHC 30.5 06/15/2020   RDW 17.9 (H) 06/15/2020   LYMPHSABS 2.2 06/01/2020   MONOABS 0.9 06/01/2020   EOSABS 0.6 (H) 06/01/2020    BMET Lab Results  Component Value Date   NA 139 06/16/2020   K 4.0 06/16/2020   CL 100 06/16/2020   CO2 28 06/16/2020   GLUCOSE 90 06/16/2020   BUN 23 (H) 06/16/2020   CREATININE 0.67 06/16/2020   CALCIUM 10.3 06/16/2020   GFRNONAA >60 06/16/2020   GFRAA >60 06/16/2020      Assessment and Plan  MRSA endocarditis and pulmonary Continue on doxycycline 100mg  bid. Plan to refer back to CT surgery for evaluation and repeat echo. Has follow up on dentistry. He reports being dedicated to taking his medications to ensure he stays clean, in anticipation of eval for valve replacement  Tachycardia = continue on current regimen and also has follow up with heart failure clinic  Hx of drug use = Continue Attending AA and drug rehab program

## 2020-07-01 ENCOUNTER — Other Ambulatory Visit (HOSPITAL_COMMUNITY): Payer: Self-pay | Admitting: Adult Health

## 2020-07-01 MED ORDER — DOXYCYCLINE HYCLATE 100 MG PO TABS: 100.0000 mg | ORAL_TABLET | Freq: Two times a day (BID) | ORAL | 0 refills | Status: DC

## 2020-07-01 NOTE — Telephone Encounter (Signed)
Advanced Heart Failure Patient Advocate Encounter   Patient was approved to receive Corlanor from [facility]  Patient ID: [id number] Effective dates: 06/30/20 through 06/30/21  Called and spoke with the patient's mother regarding approval. Will also try and get him set up with the HF fund for applicable medications. Will call and update the patient as soon as that is handled as well.

## 2020-07-01 NOTE — Telephone Encounter (Signed)
Heather, (RN) was able to send the patient's Carvedilol and Spironolactone over to [facility]. Called and updated the patient's mother. Gave her information on goodrx for his other medications.  Archer Asa, CPhT

## 2020-07-02 ENCOUNTER — Encounter: Payer: Self-pay | Admitting: Family Medicine

## 2020-07-04 ENCOUNTER — Other Ambulatory Visit: Payer: Self-pay

## 2020-07-04 ENCOUNTER — Ambulatory Visit (HOSPITAL_COMMUNITY)
Admission: RE | Admit: 2020-07-04 | Discharge: 2020-07-04 | Disposition: A | Discharge status: Home / Self Care | Payer: Self-pay | Source: Ambulatory Visit | Attending: Family Medicine | Admitting: Family Medicine

## 2020-07-04 ENCOUNTER — Ambulatory Visit: Payer: Self-pay | Attending: Family Medicine | Admitting: Family Medicine

## 2020-07-04 ENCOUNTER — Encounter: Payer: Self-pay | Admitting: Family Medicine

## 2020-07-04 ENCOUNTER — Other Ambulatory Visit (HOSPITAL_COMMUNITY): Payer: Self-pay | Admitting: Adult Health

## 2020-07-04 VITALS — BP 105/70 | HR 89 | Ht 67.0 in | Wt 143.6 lb

## 2020-07-04 DIAGNOSIS — Z131 Encounter for screening for diabetes mellitus: Secondary | ICD-10-CM

## 2020-07-04 DIAGNOSIS — R0609 Other forms of dyspnea: Secondary | ICD-10-CM

## 2020-07-04 DIAGNOSIS — R519 Headache, unspecified: Secondary | ICD-10-CM

## 2020-07-04 DIAGNOSIS — I5021 Acute systolic (congestive) heart failure: Secondary | ICD-10-CM

## 2020-07-04 DIAGNOSIS — I38 Endocarditis, valve unspecified: Secondary | ICD-10-CM

## 2020-07-04 LAB — POCT GLYCOSYLATED HEMOGLOBIN (HGB A1C): HbA1c, POC (controlled diabetic range): 4.5 % (ref 0.0–7.0)

## 2020-07-04 NOTE — Progress Notes (Signed)
HFU from 04/28/2020.  Having pain in left rib cage.  Having real bad headaches.  Wants to discuss the flu shot.

## 2020-07-04 NOTE — Progress Notes (Signed)
Subjective:  Patient ID: Clifford Clark, male    DOB: 04-17-1991  Age: 29 y.o. MRN: [id number]  CC: Hospitalization Follow-up   HPI Clifford Clark is a 29 year old male with a history of IVDU, alcohol abuse, hospitalized from 04/28/2020 through 06/17/2020 at [facility] for septic embolism due to tricuspid valve endocarditis after he was found on the side of the road and brought to the ED, imaging with findings of septic pulmonary and renal emboli and cavitary lung lesions. Hospital course involved an extensive ICU stay with use of pressors, intubation and he was managed for MRSA bacteremia. Most recent echo from 06/08/20 revealed: IMPRESSIONS    1. Large vegetatons persist on multiple leaflets with sevee TR. No change  in size since echo done 05/19/20 pericardial effuison seems trivial now .  The tricuspid valve is abnormal. Severe tricuspid stenosis.  2. There is normal pulmonary artery systolic pressure.   He complains of headache x2 dyas; also has pain in left rib cage, has also had sternal pain and when he sneezes and coughs rib pain is worse. Feels like when he is not eating he has headaches and as long as he keeps eating headache resolves. Denies n/v/blurry vision .headache mainly occurs on the top of his head Dyspnea has worsened over the last weekend and occurs even on mild exertion.  He denies presence of pedal edema.  Appointment with heart and vascular comes up in 1 week. His last chest x-ray from 05/10/2020 revealed: IMPRESSION: BILATERAL pulmonary infiltrates consistent with pneumonia.  Few nodular foci are identified likely reflecting septic emboli seen on prior CT with a small cavitary lesion again identified in the RIGHT upper lobe.   He is currently attending AA and drug rehab.  Started on hydroxyzine and Prozac for anxiety and depression by infectious disease however he is not taking this.  Endorses compliance with doxycycline. He has had chills but no fevers or  myalgias. Past Medical History:  Diagnosis Date  . Asthma   . Opiate abuse, continuous (HCC)    Heroin    Past Surgical History:  Procedure Laterality Date  . APPLICATION OF ANGIOVAC N/A 05/10/2020   Procedure: APPLICATION OF ANGIOVAC;  Surgeon: [name], MD;  Location: [facility] OR;  Service: Vascular;  Laterality: N/A;  . BUBBLE STUDY  05/06/2020   Procedure: BUBBLE STUDY;  Surgeon: [name], MD;  Location: [facility] ENDOSCOPY;  Service: Cardiovascular;;  . I & D EXTREMITY Bilateral 05/03/2020   Procedure: BILATERAL IRRIGATION AND DEBRIDEMENT HANDS;  Surgeon: [name], MD;  Location: [facility] ORS;  Service: Orthopedics;  Laterality: Bilateral;  . MULTIPLE EXTRACTIONS WITH ALVEOLOPLASTY N/A 06/14/2020   Procedure: Surgical extraction of tooth #'s 3 and 28 with alveoloplasty and gross debridement of remaining dentition.;  Surgeon: [name], DDS;  Location: [facility] OR;  Service: Oral Surgery;  Laterality: N/A;  . TEE WITHOUT CARDIOVERSION N/A 05/06/2020   Procedure: TRANSESOPHAGEAL ECHOCARDIOGRAM (TEE);  Surgeon: [name], MD;  Location: [facility] ENDOSCOPY;  Service: Cardiovascular;  Laterality: N/A;  . TEE WITHOUT CARDIOVERSION N/A 05/10/2020   Procedure: TRANSESOPHAGEAL ECHOCARDIOGRAM (TEE);  Surgeon: [name], MD;  Location: [facility] OR;  Service: Thoracic;  Laterality: N/A;    No family history on file.  No Known Allergies  Outpatient Medications Prior to Visit  Medication Sig Dispense Refill  . carvedilol (COREG) 6.25 MG tablet Take 1 tablet (6.25 mg total) by mouth 2 (two) times daily with a meal. 60 tablet 3  . ivabradine (  CORLANOR) 7.5 MG TABS tablet Take 1 tablet (7.5 mg total) by mouth 2 (two) times daily with a meal. 60 tablet 3  . sacubitril-valsartan (ENTRESTO) 24-26 MG Take 1 tablet by mouth 2 (two) times daily. 60 tablet 3  . spironolactone (ALDACTONE) 25 MG tablet Take 1 tablet (25 mg total) by mouth daily. 30 tablet 3  . zolpidem (AMBIEN) 5 MG tablet  Take 1 tablet (5 mg total) by mouth at bedtime as needed for sleep. 30 tablet 0  . amoxicillin (AMOXIL) 500 MG capsule Take four capsules one hour before dental appointment. (Patient not taking: Reported on 07/04/2020) 4 capsule 3  . doxycycline (VIBRA-TABS) 100 MG tablet Take 1 tablet (100 mg total) by mouth every 12 (twelve) hours for 21 days. Fill quantity to treat through 07/05/2020 (Patient not taking: Reported on 07/04/2020) 42 tablet 0  . doxycycline (VIBRA-TABS) 100 MG tablet Take 1 tablet (100 mg total) by mouth 2 (two) times daily. (Patient not taking: Reported on 07/04/2020) 60 tablet 0  . FLUoxetine (PROZAC) 20 MG capsule Take 1 capsule (20 mg total) by mouth daily. (Patient not taking: Reported on 06/30/2020) 30 capsule 3  . hydrOXYzine (ATARAX/VISTARIL) 25 MG tablet Take 1 tablet (25 mg total) by mouth 3 (three) times daily as needed for itching or anxiety. (Patient not taking: Reported on 07/04/2020) 30 tablet 0   No facility-administered medications prior to visit.     ROS Review of Systems  Constitutional: Negative for activity change and appetite change.  HENT: Negative for sinus pressure and sore throat.   Eyes: Negative for visual disturbance.  Respiratory: Positive for cough and shortness of breath. Negative for chest tightness.   Cardiovascular: Positive for chest pain. Negative for leg swelling.  Gastrointestinal: Negative for abdominal distention, abdominal pain, constipation and diarrhea.  Endocrine: Negative.   Genitourinary: Negative for dysuria.  Musculoskeletal: Negative for joint swelling and myalgias.  Skin: Negative for rash.  Allergic/Immunologic: Negative.   Neurological: Positive for headaches. Negative for weakness, light-headedness and numbness.  Psychiatric/Behavioral: Negative for dysphoric mood and suicidal ideas.    Objective:  BP 105/70   Pulse 89   Ht 5\' 7"  (1.702 m)   Wt 143 lb 9.6 oz (65.1 kg)   SpO2 99%   BMI 22.49 kg/m   BP/Weight 07/04/2020  06/30/2020 06/21/2020  Systolic BP 105 116 103  Diastolic BP 70 76 66  Wt. (Lbs) 143.6 141 -  BMI 22.49 22.08 -  Some encounter information is confidential and restricted. Go to Review Flowsheets activity to see all data.      Physical Exam Constitutional:      Appearance: He is well-developed.  Neck:     Vascular: No JVD.  Cardiovascular:     Rate and Rhythm: Normal rate.     Heart sounds: Normal heart sounds. No murmur heard.   Pulmonary:     Effort: Pulmonary effort is normal.     Breath sounds: Normal breath sounds. No wheezing or rales.  Chest:     Chest wall: No tenderness.  Abdominal:     General: Bowel sounds are normal. There is no distension.     Palpations: Abdomen is soft. There is no mass.     Tenderness: There is no abdominal tenderness.  Musculoskeletal:        General: Normal range of motion.     Right lower leg: No edema.     Left lower leg: No edema.  Neurological:     Mental Status: He  is alert and oriented to person, place, and time.  Psychiatric:     Comments: Anxious mood     CMP Latest Ref Rng & Units 06/16/2020 06/15/2020 06/14/2020  Glucose 70 - 99 mg/dL 90 161(W) 960(A)  BUN 6 - 20 mg/dL 54(U) 98(J) 19(J)  Creatinine 0.61 - 1.24 mg/dL 4.78 2.95 6.21  Sodium 135 - 145 mmol/L 139 139 135  Potassium 3.5 - 5.1 mmol/L 4.0 4.4 4.8  Chloride 98 - 111 mmol/L 100 103 101  CO2 22 - 32 mmol/L 28 27 24   Calcium 8.9 - 10.3 mg/dL 9.6 9.7  Total Protein 6.5 - 8.1 g/dL - - -  Total Bilirubin 0.3 - 1.2 mg/dL - - -  Alkaline Phos 38 - 126 U/L - - -  AST 15 - 41 U/L - - -  ALT 0 - 44 U/L - - -    Lipid Panel     Component Value Date/Time   CHOL 160 05/06/2019 1029   TRIG 170 (H) 05/18/2020 0344   HDL 51 05/06/2019 1029   CHOLHDL 3.1 05/06/2019 1029   CHOLHDL 4.1 01/26/2019 0657   VLDL 14 01/26/2019 0657   LDLCALC 88 05/06/2019 1029    CBC    Component Value Date/Time   WBC 12.4 (H) 06/15/2020 1148   RBC 3.90 (L) 06/15/2020 1148   HGB 11.1  (L) 06/15/2020 1148   HCT 36.4 (L) 06/15/2020 1148   PLT 333 06/15/2020 1148   MCV 93.3 06/15/2020 1148   MCH 28.5 06/15/2020 1148   MCHC 30.5 06/15/2020 1148   RDW 17.9 (H) 06/15/2020 1148   LYMPHSABS 2.2 06/01/2020 0500   MONOABS 0.9 06/01/2020 0500   EOSABS 0.6 (H) 06/01/2020 0500   BASOSABS 0.1 06/01/2020 0500    Lab Results  Component Value Date   HGBA1C 4.5 07/04/2020    Assessment & Plan:  1. Other endocarditis, unspecified chronicity Tricuspid valve endocarditis Related to IV drug use Currently on doxycycline Followed by infectious disease Has upcoming appointment with cardiology next week - CBC with Differential/Platelet  2. Acute systolic heart failure (HCC) EF of 35%, NYHA III Currently no evidence of fluid overload  This could explain his dypsnea Continue with Entresto,Corlanor, spironolactone, BB - Basic Metabolic Panel  3. Other form of dyspnea He also has thoracic chest pain Dyspnea could be related to acute CHF. Oxygen saturation is good at this time next ;we will order chest x-ray to evaluate for pneumonia especially given history of lung emboli Advised that if dyspnea worsens he needs to go to the ED. - DG Chest 2 View; Future  4. Screening for diabetes mellitus - POCT glycosylated hemoglobin (Hb A1C)  5. Acute nonintractable headache, unspecified headache type His episodes are related to hunger and I have advised him to avoid skipping meals Screen for diabetes today and A1c is 4.6 Pain is 2/10 at this time is low suspicion for migraines and he has no irritative symptoms Advised to use Tylenol extra strength prn and discussed warning signs for which he needs to present to the ED. Given history of septic emboli consider brain imaging if symptoms persist.   No orders of the defined types were placed in this encounter.   Follow-up: Return in about 6 weeks (around 08/15/2020) for coordination of care.       08/17/2020, MD, FAAFP. [facility] [place], [place]   07/04/2020, 4:27 PM

## 2020-07-04 NOTE — Patient Instructions (Signed)
General Headache Without Cause A headache is pain or discomfort that is felt around the head or neck area. There are many causes and types of headaches. In some cases, the cause may not be found. Follow these instructions at home: Watch your condition for any changes. Let your doctor know about them. Take these steps to help with your condition: Managing pain      Take over-the-counter and prescription medicines only as told by your doctor.  Lie down in a dark, quiet room when you have a headache.  If told, put ice on your head and neck area: ? Put ice in a plastic bag. ? Place a towel between your skin and the bag. ? Leave the ice on for 20 minutes, 2-3 times per day.  If told, put heat on the affected area. Use the heat source that your doctor recommends, such as a moist heat pack or a heating pad. ? Place a towel between your skin and the heat source. ? Leave the heat on for 20-30 minutes. ? Remove the heat if your skin turns bright red. This is very important if you are unable to feel pain, heat, or cold. You may have a greater risk of getting burned.  Keep lights dim if bright lights bother you or make your headaches worse. Eating and drinking  Eat meals on a regular schedule.  If you drink alcohol: ? Limit how much you use to:  0-1 drink a day for women.  0-2 drinks a day for men. ? Be aware of how much alcohol is in your drink. In the [place] one drink equals one 12 oz bottle of beer (355 mL), one 5 oz glass of wine (148 mL), or one 1 oz glass of hard liquor (44 mL).  Stop drinking caffeine, or reduce how much caffeine you drink. General instructions   Keep a journal to find out if certain things bring on headaches. For example, write down: ? What you eat and drink. ? How much sleep you get. ? Any change to your diet or medicines.  Get a massage or try other ways to relax.  Limit stress.  Sit up straight. Do not tighten (tense) your muscles.  Do not use any  products that contain nicotine or tobacco. This includes cigarettes, e-cigarettes, and chewing tobacco. If you need help quitting, ask your doctor.  Exercise regularly as told by your doctor.  Get enough sleep. This often means 7-9 hours of sleep each night.  Keep all follow-up visits as told by your doctor. This is important. Contact a doctor if:  Your symptoms are not helped by medicine.  You have a headache that feels different than the other headaches.  You feel sick to your stomach (nauseous) or you throw up (vomit).  You have a fever. Get help right away if:  Your headache gets very bad quickly.  Your headache gets worse after a lot of physical activity.  You keep throwing up.  You have a stiff neck.  You have trouble seeing.  You have trouble speaking.  You have pain in the eye or ear.  Your muscles are weak or you lose muscle control.  You lose your balance or have trouble walking.  You feel like you will pass out (faint) or you pass out.  You are mixed up (confused).  You have a seizure. Summary  A headache is pain or discomfort that is felt around the head or neck area.  There are many causes and   types of headaches. In some cases, the cause may not be found.  Keep a journal to help find out what causes your headaches. Watch your condition for any changes. Let your doctor know about them.  Contact a doctor if you have a headache that is different from usual, or if your headache is not helped by medicine.  Get help right away if your headache gets very bad, you throw up, you have trouble seeing, you lose your balance, or you have a seizure. This information is not intended to replace advice given to you by your health care provider. Make sure you discuss any questions you have with your health care provider. Document Revised: 04/28/2018 Document Reviewed: 04/28/2018 Elsevier Patient Education  2020 Elsevier Inc.  

## 2020-07-05 LAB — BASIC METABOLIC PANEL
BUN/Creatinine Ratio: 18 (ref 9–20)
BUN: 15 mg/dL (ref 6–20)
CO2: 22 mmol/L (ref 20–29)
Calcium: 9.5 mg/dL (ref 8.7–10.2)
Chloride: 96 mmol/L (ref 96–106)
Creatinine, Ser: 0.82 mg/dL (ref 0.76–1.27)
GFR calc Af Amer: 137 mL/min/{1.73_m2} (ref >59)
GFR calc non Af Amer: 119 mL/min/{1.73_m2} (ref >59)
Glucose: 113 mg/dL — ABNORMAL HIGH (ref 65–99)
Potassium: 4.5 mmol/L (ref 3.5–5.2)
Sodium: 134 mmol/L (ref 134–144)

## 2020-07-05 LAB — CBC WITH DIFFERENTIAL/PLATELET
Basophils Absolute: 0.1 10*3/uL (ref 0.0–0.2)
Basos: 1 %
EOS (ABSOLUTE): 0.1 10*3/uL (ref 0.0–0.4)
Eos: 1 %
Hematocrit: 36 % — ABNORMAL LOW (ref 37.5–51.0)
Hemoglobin: 11.6 g/dL — ABNORMAL LOW (ref 13.0–17.7)
Immature Grans (Abs): 0.1 10*3/uL (ref 0.0–0.1)
Immature Granulocytes: 1 %
Lymphocytes Absolute: 2.5 10*3/uL (ref 0.7–3.1)
Lymphs: 17 %
MCH: 28.4 pg (ref 26.6–33.0)
MCHC: 32.2 g/dL (ref 31.5–35.7)
MCV: 88 fL (ref 79–97)
Monocytes Absolute: 1.2 10*3/uL — ABNORMAL HIGH (ref 0.1–0.9)
Monocytes: 8 %
Neutrophils Absolute: 10.9 10*3/uL — ABNORMAL HIGH (ref 1.4–7.0)
Neutrophils: 72 %
Platelets: 346 10*3/uL (ref 150–450)
RBC: 4.08 x10E6/uL — ABNORMAL LOW (ref 4.14–5.80)
RDW: 14.4 % (ref 11.6–15.4)
WBC: 14.9 10*3/uL — ABNORMAL HIGH (ref 3.4–10.8)

## 2020-07-06 ENCOUNTER — Telehealth: Payer: Self-pay

## 2020-07-06 NOTE — Telephone Encounter (Signed)
Opened in error

## 2020-07-06 NOTE — Telephone Encounter (Signed)
Patients  States having cough, headaches, no fever and no shortness of breath. Patient did not sound labored over the phone. States he is taking tylenol for the headache which helps. Advised patient to be Covid tested, but patient politely declined. Patient states if he feels any worse will go to the ER.  Routing message to Dr. Drue Second to make aware. Valarie Cones

## 2020-07-07 ENCOUNTER — Other Ambulatory Visit: Payer: Self-pay

## 2020-07-07 ENCOUNTER — Telehealth: Payer: Self-pay

## 2020-07-07 DIAGNOSIS — I079 Rheumatic tricuspid valve disease, unspecified: Secondary | ICD-10-CM

## 2020-07-07 NOTE — Telephone Encounter (Signed)
-----   Message from Judyann Munson, MD sent at 07/07/2020 12:08 PM EDT ----- Can you get patient in to check cbc, sed rate, bmp, and urine drug screen ----- Message ----- From: [name] Sent: 07/05/2020   2:23 PM EDT To: Judyann Munson, MD  Please see note from Dr Cliffton Asters, can you set up for patient to have the urine screen? Please let us know. Thanks  cindy ----- Message ----- From: Davina Poke, RN Sent: 07/05/2020  10:01 AM EDT To: [name]  See below. Either we can order or Dr. Feliz Beam office?  On the 2-3 urine screen, he needs to do it the same day as seeing [place] in office.  Thanks, Ryan ----- Message ----- From: [name], MD Sent: 07/05/2020   9:50 AM EDT To: Davina Poke, RN  He does not need an updated echo just yet.  He will need 3 random urine drug screens prior to being considered for surgery.  Once ordered he needs to perform urine drug screen on the same day.  Thanks,  H. ----- Message ----- From: Davina Poke, RN Sent: 07/04/2020   4:13 PM EDT To: Wallene Dales, MD  Cindy, let me check with Dr. Cliffton Asters since he saw him in the hospital.  His note from 8/3 said: "Pt will be reassessed as an outpatient once he has completed his drug rehab program for tricuspid valve repair"  Dr. Cliffton Asters: please see below and Dr. Feliz Beam note.  Looks like patient is in AA and a drug rehab program 3 times a week.  Last echo was 8/18.  Do you need an updated one?  Thanks, Ryan ----- Message ----- From: [name] Sent: 07/04/2020   4:04 PM EDT To: Davina Poke, RN  Endocarditis of tricuspid valve, BPZ:[id number],DPOEUMP, echo 8/18 cta 7/23, TEE 7/20 where do you want me to add this?  Thanks  cindy

## 2020-07-07 NOTE — Telephone Encounter (Signed)
Was able to reach patient and schedule for lab appointment on 9/17 at 9:30.   Clifford Clark

## 2020-07-07 NOTE — Progress Notes (Signed)
Orders per Dr. Drue Second.  Clifford Clark

## 2020-07-08 ENCOUNTER — Other Ambulatory Visit: Payer: Self-pay

## 2020-07-10 NOTE — Progress Notes (Signed)
PCP: Dr Alvis Lemmings Primary Cardiologist: Dr Gala Romney  ID: Dr Drue Second CT Surgery: Dr Cliffton Asters  HPI: Clifford Clark is a 29 year old with a history of h/o asthma and recent IVDU.  Admitted 04/2020 after being found down on the side of the road. ER w/u showed multiple septic emboli to lungs with cavitary lesions. 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 TEE with EF 60-65% small PFO. large TV vegetation with severe TR. No vegetations on Aov/MV despite left-sided emboli.Underwent AngioJet removal of part of vegetation. Then developed MRSA sepsis and respiratory failure. Intubated and started on pressors. F/u echo LVEF 20-25%. RV normal. Hospital course complicated by respiratory failure and septic shock. As he improved we repeated limited ECHO and EF was up to 35-40% but had ongoing vegetation. HF meds adjusted. Completed IV antibiotics prior to d/c. Placed on corlanor due to persistent tachycardia. Heart rate better controlled at d/c with heart rate in the 80s.   Today he returns for post hospital follow up. Overall feeling ok but has a hard time getting his breath when he is talking. Says his breathing has been worse over the last week. Mild-Mod SOB walking. Says he is coughing up clear sputum in the mornings. Denies SOB/PND/Orthopnea. Appetite ok. No fever or chills. Weight at home 140 pounds. Taking all medications but not taking prozac. Lives with his mom. Has not used IV drugs since July 2021. He has been attending intensive outpatient drug counseling and has had 3 drug tests since d/c.   Echo 04/29/20  LVEF 50-55%. RV normal - TEE 7/16, LVEF normal 60-65%, RV normal - Limited Echo 7/24, LVEF severely reduced 20-25%, RV systolic function hyperdynamic. MV w/ mild-mod MR but no seen vegetation . Medium size 0.5 x 1.75 cm mobile filamentous mass on the tricupsid valve that prolapses across the valve plane, consistent with vegetation. The tricuspid valve is  abnormal. Tricuspid valve regurgitation  is severe.  -  Repeat limited echo 05/19/20 w/ interval improvement, LVEF up to 30-35%. Tricuspid valve vegetations are seen, measuring up to 1.8 x 0.8 cm attached to posterior leaflet and up to 1.8 x 1.0 cm attached to anterior  leaflet. The tricuspid valve is abnormal. Tricuspid valve regurgitation is  - ECHO 06/08/20 showed EF 35-40%, tricuspid valve with large vegetation. RV mildly reduced    ROS: All systems negative except as listed in HPI, PMH and Problem List.  SH:  Social History   Socioeconomic History  . Marital status: Single    Spouse name: Not on file  . Number of children: Not on file  . Years of education: Not on file  . Highest education level: Not on file  Occupational History  . Not on file  Tobacco Use  . Smoking status: Former Smoker    Packs/day: 0.50  . Smokeless tobacco: Never Used  . Tobacco comment: using nicotine patches  Vaping Use  . Vaping Use: Never used  Substance and Sexual Activity  . Alcohol use: Not Currently    Comment: rarely  . Drug use: Not Currently    Types: Marijuana, IV, "Crack" cocaine, Cocaine, Fentanyl, Methamphetamines, Amphetamines    Comment: Heroin  . Sexual activity: Yes  Other Topics Concern  . Not on file  Social History Narrative  . Not on file   Social Determinants of Health   Financial Resource Strain:   . Difficulty of Paying Living Expenses: Not on file  Food Insecurity:   . Worried  About Running Out of Food in the Last Year: Not on file  . Ran Out of Food in the Last Year: Not on file  Transportation Needs:   . Lack of Transportation (Medical): Not on file  . Lack of Transportation (Non-Medical): Not on file  Physical Activity:   . Days of Exercise per Week: Not on file  . Minutes of Exercise per Session: Not on file  Stress:   . Feeling of Stress : Not on file  Social Connections:   . Frequency of Communication with Friends and Family: Not on file  . Frequency of  Social Gatherings with Friends and Family: Not on file  . Attends Religious Services: Not on file  . Active Member of Clubs or Organizations: Not on file  . Attends Banker Meetings: Not on file  . Marital Status: Not on file  Intimate Partner Violence:   . Fear of Current or Ex-Partner: Not on file  . Emotionally Abused: Not on file  . Physically Abused: Not on file  . Sexually Abused: Not on file    FH: History reviewed. No pertinent family history.  Past Medical History:  Diagnosis Date  . Asthma   . Opiate abuse, continuous (HCC)    Heroin    Current Outpatient Medications  Medication Sig Dispense Refill  . amoxicillin (AMOXIL) 500 MG capsule Take four capsules one hour before dental appointment. 4 capsule 3  . carvedilol (COREG) 6.25 MG tablet Take 1 tablet (6.25 mg total) by mouth 2 (two) times daily with a meal. 60 tablet 3  . doxycycline (VIBRA-TABS) 100 MG tablet Take 1 tablet (100 mg total) by mouth 2 (two) times daily. 60 tablet 0  . ivabradine (CORLANOR) 7.5 MG TABS tablet Take 1 tablet (7.5 mg total) by mouth 2 (two) times daily with a meal. 60 tablet 3  . Pseudoephedrine-APAP-DM (DAYQUIL PO) Take by mouth.    . sacubitril-valsartan (ENTRESTO) 24-26 MG Take 1 tablet by mouth 2 (two) times daily. 60 tablet 3  . spironolactone (ALDACTONE) 25 MG tablet Take 1 tablet (25 mg total) by mouth daily. 30 tablet 3  . zolpidem (AMBIEN) 5 MG tablet Take 1 tablet (5 mg total) by mouth at bedtime as needed for sleep. 30 tablet 0  . FLUoxetine (PROZAC) 20 MG capsule Take 1 capsule (20 mg total) by mouth daily. (Patient not taking: Reported on 06/30/2020) 30 capsule 3  . hydrOXYzine (ATARAX/VISTARIL) 25 MG tablet Take 1 tablet (25 mg total) by mouth 3 (three) times daily as needed for itching or anxiety. (Patient not taking: Reported on 07/04/2020) 30 tablet 0   No current facility-administered medications for this encounter.    Vitals:   07/11/20 1402  BP: 108/62    Pulse: 81  SpO2: 100%  Weight: 64.3 kg  Height: 5\' 7"  (1.702 m)   Wt Readings from Last 3 Encounters:  07/11/20 64.3 kg  07/04/20 65.1 kg  06/30/20 64 kg     PHYSICAL EXAM: General:  Walked in the clinic.  No resp difficulty HEENT: normal Neck: supple. JVP 10-11 . Carotids 2+ bilaterally; no bruits. No lymphadenopathy or thryomegaly appreciated. Cor: PMI normal. Regular rate & rhythm. No rubs, gallops  + 2/6 TR murmurs. Lungs: RLL and LLL  crackles  Abdomen: soft, nontender, nondistended. No hepatosplenomegaly. No bruits or masses. Good bowel sounds. Extremities: no cyanosis, clubbing, rash, edema Neuro: alert & orientedx3, cranial nerves grossly intact. Moves all 4 extremities w/o difficulty. Affect pleasant.   ECG: SR  84 bpm    ASSESSMENT & PLAN:  1. Chronic Systolic HF  Echo 04/29/20  LVEF 50-55%. RV normal - TEE 7/16, LVEF normal 60-65%, RV normal - Limited Echo 7/24, LVEF severely reduced 20-25%, RV systolic function hyperdynamic. MV w/ mild-mod MR but no seen vegetation  - suspect acute drop in EF stress induced from septic shock. Doubt CAD - Repeat limited echo 7/29 w/ interval improvement, LVEF up to 30-35%. Given ongoing tachycardia ECHO was repeated and showed EF 35-40% with no large vegetation, RV mildly reduced.  - NYHA III. Volume status elevated. Instructed to take 40 mg po lasix daily x 2 days then as needed.  - Continue Spiro to 25 mg daily.  - Continue corlanor to 7.5 mg bid .  - Continue coreg 6.25 mg twice a day. May need further titration if HR does not improve w/ diuresis (BP a bit soft).   -Continue Entresto 24-26 mg bid. NO room to titrate with soft SBP.   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 There was also amall flickering leaflet mass, suggestive of vegetation of the pulmonic valve.No mitral or  aortic valve pathology. -s/p Angiovac debulking on 7/20 but still with large vegetation  - Completed IV antibiotics. On oral antibiotics.  - Followed closely by ID  -  Will eventually need TV repair if he is a candidate.  -Referred back to CT surgery.  - Check UDS  3. H/O IV Drug Use Check UDS.  Continuing in outpatient intensive drug therapy.    Follow up in 2 weeks to reassess volume therapy. Reviewed hospital course and d/c summary. Discussed medication plan and referral back to Dr Cliffton Asters.   Clifford Finkler NP-C  3:20 PM

## 2020-07-11 ENCOUNTER — Encounter: Payer: Self-pay | Admitting: Infectious Diseases

## 2020-07-11 ENCOUNTER — Ambulatory Visit (INDEPENDENT_AMBULATORY_CARE_PROVIDER_SITE_OTHER): Payer: Self-pay | Admitting: Infectious Diseases

## 2020-07-11 ENCOUNTER — Other Ambulatory Visit: Payer: Self-pay

## 2020-07-11 ENCOUNTER — Encounter (HOSPITAL_COMMUNITY): Payer: Self-pay

## 2020-07-11 ENCOUNTER — Ambulatory Visit (HOSPITAL_COMMUNITY)
Admission: RE | Admit: 2020-07-11 | Discharge: 2020-07-11 | Disposition: A | Discharge status: Home / Self Care | Payer: Self-pay | Source: Ambulatory Visit | Attending: Adult Health | Admitting: Adult Health

## 2020-07-11 VITALS — BP 108/62 | HR 81 | Ht 67.0 in | Wt 141.8 lb

## 2020-07-11 DIAGNOSIS — Z87891 Personal history of nicotine dependence: Secondary | ICD-10-CM | POA: Insufficient documentation

## 2020-07-11 DIAGNOSIS — R05 Cough: Secondary | ICD-10-CM | POA: Insufficient documentation

## 2020-07-11 DIAGNOSIS — F1121 Opioid dependence, in remission: Secondary | ICD-10-CM

## 2020-07-11 DIAGNOSIS — Z7901 Long term (current) use of anticoagulants: Secondary | ICD-10-CM | POA: Insufficient documentation

## 2020-07-11 DIAGNOSIS — B9562 Methicillin resistant Staphylococcus aureus infection as the cause of diseases classified elsewhere: Secondary | ICD-10-CM | POA: Insufficient documentation

## 2020-07-11 DIAGNOSIS — Z79899 Other long term (current) drug therapy: Secondary | ICD-10-CM | POA: Insufficient documentation

## 2020-07-11 DIAGNOSIS — I079 Rheumatic tricuspid valve disease, unspecified: Secondary | ICD-10-CM | POA: Insufficient documentation

## 2020-07-11 DIAGNOSIS — I2690 Septic pulmonary embolism without acute cor pulmonale: Secondary | ICD-10-CM | POA: Insufficient documentation

## 2020-07-11 DIAGNOSIS — F1990 Other psychoactive substance use, unspecified, uncomplicated: Secondary | ICD-10-CM | POA: Insufficient documentation

## 2020-07-11 DIAGNOSIS — I5022 Chronic systolic (congestive) heart failure: Secondary | ICD-10-CM | POA: Insufficient documentation

## 2020-07-11 DIAGNOSIS — I5021 Acute systolic (congestive) heart failure: Secondary | ICD-10-CM

## 2020-07-11 DIAGNOSIS — R000 Tachycardia, unspecified: Secondary | ICD-10-CM | POA: Insufficient documentation

## 2020-07-11 DIAGNOSIS — F1910 Other psychoactive substance abuse, uncomplicated: Secondary | ICD-10-CM

## 2020-07-11 DIAGNOSIS — R071 Chest pain on breathing: Secondary | ICD-10-CM

## 2020-07-11 DIAGNOSIS — R0789 Other chest pain: Secondary | ICD-10-CM | POA: Insufficient documentation

## 2020-07-11 LAB — RAPID URINE DRUG SCREEN, HOSP PERFORMED
Amphetamines: NOT DETECTED
Barbiturates: NOT DETECTED
Benzodiazepines: NOT DETECTED
Cocaine: NOT DETECTED
Opiates: NOT DETECTED
Tetrahydrocannabinol: NOT DETECTED

## 2020-07-11 MED ORDER — FUROSEMIDE 40 MG PO TABS: 40.0000 mg | ORAL_TABLET | Freq: Every day | ORAL | 0 refills | Status: DC

## 2020-07-11 NOTE — Progress Notes (Signed)
Patient: Clifford Clark  DOB: 30-Sep-1991 MRN: [id number] PCP: Hoy Register, MD     Patient Active Problem List   Diagnosis Date Noted  . Septic embolism (HCC) 04/28/2020    Priority: High  . 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    Priority: High  . Acute systolic heart failure (HCC)   . Acute respiratory failure (HCC)   . Chest pain   . Elevated BUN 05/02/2020  . Malnutrition of moderate degree 04/29/2020  . 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 Thrombocytopenia (HCC) 04/28/2020  . Homeless 04/28/2020  . Hypokalemia 04/28/2020  . Hyponatremia 04/28/2020  . Encounter to establish care 04/02/2019     Subjective:   Chief Complaint  Patient presents with  . Follow-up    c/o shortness of breath      HPI:  Here with his mother for follow up care.  Clifford Clark is here today for follow-up after prolonged hospitalization for endocarditis of the native tricuspid valve due to MRSA complicated by septic pulmonary emboli to both lungs, respiratory failure and prolonged bacteremia. He saw Dr. Drue Second about 3 weeks ago and was doing pretty well at that time. He was continued on his doxycycline by mouth and was working hard with his drug rehab program. Has been having trouble with some anxiety and is trying to get in with a psychiatrist and counselor.   The other night he had headaches to the point where his ears were ringing and dizziness with standing.  This has not recurred since.  He sleeps on one pillow at night and no orthopnea. He does have a lot of coughing in the morning that is occasionally productive.  He is able to complete most of his ADLs on his own.  However he needs to space it out so he does not do too much at once.   He has some pain along the back rib cage that he notices with deep breaths, coughing or sneezing.  Primarily this is in the back right but sometimes the back left at the diaphragm.  He has had 1 or 2 nights where he noticed some  sweating.  But no change in the last week.  No measured fevers or chills to his knowledge. He does not have any other complaints today to suggest an alternative process and denies any joint or back pain, skin infections, abdominal pain, URI or dysuria.  He saw the heart failure team earlier today and was given some Lasix for the next 2 days.  He has not picked this up yet.  States that his weights have been stable.    Review of Systems  Constitutional: Positive for diaphoresis and malaise/fatigue. Negative for chills and fever.  HENT: Negative for congestion and sore throat.   Eyes: Negative for blurred vision.  Respiratory: Positive for cough, sputum production and shortness of breath.   Cardiovascular: Positive for chest pain (with coughing, posterior).  Gastrointestinal: Negative for abdominal pain, nausea and vomiting.  Genitourinary: Negative for dysuria.  Musculoskeletal: Negative for back pain and joint pain.  Skin: Negative for rash.  Neurological: Negative for weakness.  Psychiatric/Behavioral: Positive for depression. The patient is nervous/anxious.     Past Medical History:  Diagnosis Date  . Asthma   . Opiate abuse, continuous (HCC)    Heroin    Outpatient Medications Prior to Visit  Medication Sig Dispense Refill  . amoxicillin (AMOXIL) 500 MG capsule Take four capsules one hour before dental appointment. 4 capsule 3  .  carvedilol (COREG) 6.25 MG tablet Take 1 tablet (6.25 mg total) by mouth 2 (two) times daily with a meal. 60 tablet 3  . doxycycline (VIBRA-TABS) 100 MG tablet Take 1 tablet (100 mg total) by mouth 2 (two) times daily. 60 tablet 0  . furosemide (LASIX) 40 MG tablet Take 1 tablet (40 mg total) by mouth daily for 2 days. Then as needed for weight gain/SOB 15 tablet 0  . hydrOXYzine (ATARAX/VISTARIL) 25 MG tablet Take 1 tablet (25 mg total) by mouth 3 (three) times daily as needed for itching or anxiety. 30 tablet 0  . ivabradine (CORLANOR) 7.5 MG TABS  tablet Take 1 tablet (7.5 mg total) by mouth 2 (two) times daily with a meal. 60 tablet 3  . sacubitril-valsartan (ENTRESTO) 24-26 MG Take 1 tablet by mouth 2 (two) times daily. 60 tablet 3  . spironolactone (ALDACTONE) 25 MG tablet Take 1 tablet (25 mg total) by mouth daily. 30 tablet 3  . zolpidem (AMBIEN) 5 MG tablet Take 1 tablet (5 mg total) by mouth at bedtime as needed for sleep. 30 tablet 0  . FLUoxetine (PROZAC) 20 MG capsule Take 1 capsule (20 mg total) by mouth daily. (Patient not taking: Reported on 06/30/2020) 30 capsule 3  . Pseudoephedrine-APAP-DM (DAYQUIL PO) Take by mouth. (Patient not taking: Reported on 07/11/2020)     No facility-administered medications prior to visit.     No Known Allergies  Social History   Tobacco Use  . Smoking status: Former Smoker    Packs/day: 0.50  . Smokeless tobacco: Never Used  . Tobacco comment: using nicotine patches  Vaping Use  . Vaping Use: Never used  Substance Use Topics  . Alcohol use: Not Currently    Comment: rarely  . Drug use: Not Currently    Types: Marijuana, IV, "Crack" cocaine, Cocaine, Fentanyl, Methamphetamines, Amphetamines    Comment: Heroin    No family history on file.  Objective:   Vitals:   07/11/20 1517  BP: 105/69  Pulse: 81  Temp: 98.1 F (36.7 C)  TempSrc: Oral  SpO2: 99%  Weight: 141 lb (64 kg)  Height: 5\' 7"  (1.702 m)   Body mass index is 22.08 kg/m.  Physical Exam Constitutional:      Appearance: He is well-developed.     Comments: Seated comfortably in chair  HENT:     Mouth/Throat:     Mouth: Mucous membranes are moist.     Dentition: Normal dentition. No dental abscesses.     Pharynx: Oropharynx is clear. No oropharyngeal exudate.  Eyes:     General: No scleral icterus.    Pupils: Pupils are equal, round, and reactive to light.  Cardiovascular:     Rate and Rhythm: Normal rate and regular rhythm.     Heart sounds: Murmur (systolic) heard.   Pulmonary:     Breath sounds:  Normal breath sounds.     Comments: He has rales / dullness over right lower posterior lung field Otherwise clear throughout Slight increased work of breathing noted with RR in mid 20s at rest.  Abdominal:     General: There is no distension.     Palpations: Abdomen is soft.     Tenderness: There is no abdominal tenderness.  Musculoskeletal:     Right lower leg: No edema.     Left lower leg: No edema.  Lymphadenopathy:     Cervical: No cervical adenopathy.  Skin:    General: Skin is warm and dry.  Capillary Refill: Capillary refill takes less than 2 seconds.     Findings: No rash.  Neurological:     Mental Status: He is alert and oriented to person, place, and time.  Psychiatric:        Judgment: Judgment normal.     Comments: In good spirits      Lab Results: Lab Results  Component Value Date   WBC 14.9 (H) 07/04/2020   HGB 11.6 (L) 07/04/2020   HCT 36.0 (L) 07/04/2020   MCV 88 07/04/2020   PLT 346 07/04/2020    Lab Results  Component Value Date   CREATININE 0.82 07/04/2020   BUN 15 07/04/2020   NA 134 07/04/2020   K 4.5 07/04/2020   CL 96 07/04/2020   CO2 22 07/04/2020    Lab Results  Component Value Date   ALT 114 (H) 05/25/2020   AST 84 (H) 05/25/2020   ALKPHOS 95 05/25/2020   BILITOT 0.4 05/25/2020     Assessment & Plan:   Problem List Items Addressed This Visit      High   Endocarditis of tricuspid valve    He is status post angio back procedure in July 2021 by Dr. Cliffton Asters.  His course was complicated by respiratory failure requiring intubation and ICU care, persistent bacteremia and multiple cavitary nodular pulmonary emboli.  He continues on doxycycline twice a day at present.  His LVEF had depressed significantly in the setting of current infection.  He has mild cardiomegaly noted on recent chest x-ray.  Heart failure team referred him back to Dr. Cliffton Asters today for cardiac surgery planning.  He has undergone required dental visits and is  hopeful he can schedule this soon.  Urine drug screen pending.  Seems like he is doing very well with regards to his drug rehab. We spent a large majority of the visit recapping his hospital stay in discussing natural progression of bacterial endocarditis as it relates to his current symptoms and situation. Given his burden of pulmonary disease, leukocytosis and chest pain, productive cough will evaluate further with chest CT to ensure no loculated empyema or effusion is noted. We will have him continue doxycycline until this is done and return to clinic with either myself or Dr. Drue Second in 3 weeks.        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  We discussed the benefit of adding psychiatry and counseling services to his team to ensure he has as much support as he needs.      Chest pain   Relevant Orders   CT CHEST WO CONTRAST     I spent 25 minutes with the patient and his mother in direct discussion of the above points.  Rexene Alberts, MSN, NP-C [facility] for Infectious Disease [facility] Pager: [phone] Office: [phone]  07/11/20  4:06 PM

## 2020-07-11 NOTE — Assessment & Plan Note (Signed)
He is status post angio back procedure in July 2021 by Dr. Cliffton Asters.  His course was complicated by respiratory failure requiring intubation and ICU care, persistent bacteremia and multiple cavitary nodular pulmonary emboli.  He continues on doxycycline twice a day at present.  His LVEF had depressed significantly in the setting of current infection.  He has mild cardiomegaly noted on recent chest x-ray.  Heart failure team referred him back to Dr. Cliffton Asters today for cardiac surgery planning.  He has undergone required dental visits and is hopeful he can schedule this soon.  Urine drug screen pending.  Seems like he is doing very well with regards to his drug rehab. We spent a large majority of the visit recapping his hospital stay in discussing natural progression of bacterial endocarditis as it relates to his current symptoms and situation. Given his burden of pulmonary disease, leukocytosis and chest pain, productive cough will evaluate further with chest CT to ensure no loculated empyema or effusion is noted. We will have him continue doxycycline until this is done and return to clinic with either myself or Dr. Drue Second in 3 weeks.

## 2020-07-11 NOTE — Patient Instructions (Signed)
START Lasix 40 mg daily for 2 days, then as needed for weight gain or swelling  Your physician recommends that you schedule a follow-up appointment in: 2 weeks  in the [facility] and in 4 weeks with Dr Gala Romney  If you have any questions or concerns before your next appointment please send [place] a message through [place] or call our office at [phone].    TO LEAVE A MESSAGE FOR THE NURSE SELECT OPTION 2, PLEASE LEAVE A MESSAGE INCLUDING: . YOUR NAME . DATE OF BIRTH . CALL BACK NUMBER . REASON FOR CALL**this is important as we prioritize the call backs  YOU WILL RECEIVE A CALL BACK THE SAME DAY AS LONG AS YOU CALL BEFORE 4:00 PM

## 2020-07-11 NOTE — Assessment & Plan Note (Signed)
Clifford Clark seems to be doing very well with regards to managing opioid disorder.  We discussed the benefit of adding psychiatry and counseling services to his team to ensure he has as much support as he needs.

## 2020-07-11 NOTE — Patient Instructions (Addendum)
Nice to see you today.  I would like to get you set up for a CT scan of the lungs to make certain there is no residual infection that we need to worry about given your chest pain, cough and shortness of breath.  It may be that these are all related to your heart function being low.  Please continue taking the doxycycline antibiotic twice a day.   I would like to repeat your heart ultrasound.  We will talk more about this at your upcoming office visit with myself or Dr. Drue Second in 3 weeks.

## 2020-07-14 ENCOUNTER — Encounter (HOSPITAL_COMMUNITY): Payer: Self-pay

## 2020-07-14 ENCOUNTER — Ambulatory Visit (HOSPITAL_COMMUNITY)
Admission: RE | Admit: 2020-07-14 | Discharge: 2020-07-14 | Disposition: A | Discharge status: Home / Self Care | Payer: Self-pay | Source: Ambulatory Visit | Attending: Infectious Diseases | Admitting: Infectious Diseases

## 2020-07-14 DIAGNOSIS — R071 Chest pain on breathing: Secondary | ICD-10-CM | POA: Insufficient documentation

## 2020-07-15 ENCOUNTER — Telehealth (HOSPITAL_COMMUNITY): Payer: Self-pay | Admitting: Pharmacy Technician

## 2020-07-15 MED FILL — SPIRONOLACTONE 25 MG TABS: 25 | 34 days supply | Qty: 34 | Fill #0

## 2020-07-15 MED FILL — CARVEDILOL 6.25 MG TABLET: 6.25 | 34 days supply | Qty: 68 | Fill #0

## 2020-07-15 NOTE — Telephone Encounter (Signed)
Was able to get the patient two sample bottles of Corlanor.  LOT: [id number] EXP: 04/2024  Left at the check in desk for the patient's mom to pick up.  Archer Asa, CPhT

## 2020-07-21 ENCOUNTER — Telehealth: Payer: Self-pay

## 2020-07-21 ENCOUNTER — Other Ambulatory Visit: Payer: Self-pay

## 2020-07-21 DIAGNOSIS — I079 Rheumatic tricuspid valve disease, unspecified: Secondary | ICD-10-CM

## 2020-07-21 NOTE — Telephone Encounter (Signed)
-----   Message from [name] sent at 07/21/2020 10:56 AM EDT ----- I can call him, thanks ----- Message ----- From: [name], RN Sent: 07/21/2020  10:09 AM EDT To: [name]  I placed the order, does the patient know?  Or do I need to call him??  Thanks,  Morrie Sheldon ----- Message ----- From: [name] Sent: 07/20/2020   9:32 AM EDT To: [name], LPN, [name], RN  Patient needs STAT urine drug screen before his appointment Friday 10/1 with Dr Cliffton Asters, can one of you put the order in?  I can call the patient. Thanks  [employer]

## 2020-07-22 ENCOUNTER — Institutional Professional Consult (permissible substitution) (INDEPENDENT_AMBULATORY_CARE_PROVIDER_SITE_OTHER): Payer: Self-pay | Admitting: Thoracic Surgery (Cardiothoracic Vascular Surgery)

## 2020-07-22 ENCOUNTER — Other Ambulatory Visit: Payer: Self-pay

## 2020-07-22 ENCOUNTER — Other Ambulatory Visit: Payer: Self-pay | Admitting: Thoracic Surgery (Cardiothoracic Vascular Surgery)

## 2020-07-22 ENCOUNTER — Encounter: Payer: Self-pay | Admitting: Thoracic Surgery (Cardiothoracic Vascular Surgery)

## 2020-07-22 VITALS — BP 109/75 | HR 77 | Temp 97.7°F | Resp 20 | Ht 67.0 in | Wt 148.0 lb

## 2020-07-22 DIAGNOSIS — I079 Rheumatic tricuspid valve disease, unspecified: Secondary | ICD-10-CM

## 2020-07-22 NOTE — Progress Notes (Signed)
      [address]       [place] [address]             [phone]        [name] [facility] Medical Record #[id number] Date of Birth: 1991/07/17  Referring: Judyann Munson, MD Primary Care: [name] Cardiologist:No primary care provider on file.  Reason for visit:   follow-up  History of Present Illness:     [name] presents in follow-up.  He originally was admitted with an MSSA tricuspid valve endocarditis and was treated with angio VAC debridement back in July of this year.  Since that point, he has been discharged from hospital and is doing well.  He continues to have some exertional dyspnea as well as pleuritic chest pain.  He denies any fevers or chills and is currently on doxycycline for ongoing therapy.  He had blood cultures that were positive and August despite being continued antibiotics.  On his most recent echo he also had a persistent vegetation on tricuspid valve.  Cross-sectional imaging from last month shows healing septic emboli and no significant effusion.  Physical Exam: BP 109/75   Pulse 77   Temp 97.7 F (36.5 C) (Skin)   Resp 20   Ht 5\' 7"  (1.702 m)   Wt 148 lb (67.1 kg)   SpO2 99% Comment: RA  BMI 23.18 kg/m   Alert NAD Abdomen soft, ND No peripheral edema   Diagnostic Studies & Laboratory data: CT chest was reviewed.  Most of the septic emboli and cavitary lesions have resolved.  He continues to have some interstitial disease.  There is no obvious fluid collections in the pleural space.  A limited echo from 8-18 was also reviewed.  He continues to have persistent vegetation that is about 1.8 cm on the tricuspid valve.  Tricuspid valve regurgitation being severe.    Assessment / Plan:   29 year old male with history of IV drug abuse.  Urine culture from September 20 was negative.  He will need repeat blood cultures and repeat echo to assess for ongoing infection.  Additionally he will  need 3 more random urine drug screen to ensure that he is off of IV drugs.  He comes today with his mother to clinic, and he appears as though his he is ready to make a lifestyle change and is staying on track.  I will need to speak with psychiatry to determine his current status and the drug rehab program.  We discussed potential surgical options which include tricuspid valve repair versus repeat angio VAC.     September 22 07/22/2020 2:59 PM

## 2020-07-23 LAB — DRUG MONITORING, PANEL 7 WITH CONFIRMATION, URINE
6 Acetylmorphine: NEGATIVE ng/mL (ref <10)
Alcohol Metabolites: NEGATIVE ng/mL
Amphetamines: NEGATIVE ng/mL (ref <500)
Barbiturates: NEGATIVE ng/mL (ref <300)
Benzodiazepines: NEGATIVE ng/mL (ref <100)
Cocaine Metabolite: NEGATIVE ng/mL (ref <150)
Creatinine: 59.1 mg/dL
Marijuana Metabolite: NEGATIVE ng/mL (ref <20)
Methadone Metabolite: NEGATIVE ng/mL (ref <100)
Opiates: NEGATIVE ng/mL (ref <100)
Oxidant: NEGATIVE ug/mL
Oxycodone: NEGATIVE ng/mL (ref <100)
pH: 5.9 (ref 4.5–9.0)

## 2020-07-23 LAB — DM TEMPLATE

## 2020-07-26 ENCOUNTER — Ambulatory Visit (HOSPITAL_COMMUNITY)
Admission: RE | Admit: 2020-07-26 | Discharge: 2020-07-26 | Disposition: A | Discharge status: Home / Self Care | Payer: Self-pay | Source: Ambulatory Visit | Attending: Adult Health | Admitting: Adult Health

## 2020-07-26 ENCOUNTER — Other Ambulatory Visit: Payer: Self-pay

## 2020-07-26 VITALS — BP 105/65 | HR 77 | Wt 149.8 lb

## 2020-07-26 DIAGNOSIS — Z79899 Other long term (current) drug therapy: Secondary | ICD-10-CM | POA: Insufficient documentation

## 2020-07-26 DIAGNOSIS — F1910 Other psychoactive substance abuse, uncomplicated: Secondary | ICD-10-CM

## 2020-07-26 DIAGNOSIS — Z729 Problem related to lifestyle, unspecified: Secondary | ICD-10-CM | POA: Insufficient documentation

## 2020-07-26 DIAGNOSIS — R6521 Severe sepsis with septic shock: Secondary | ICD-10-CM | POA: Insufficient documentation

## 2020-07-26 DIAGNOSIS — I2690 Septic pulmonary embolism without acute cor pulmonale: Secondary | ICD-10-CM | POA: Insufficient documentation

## 2020-07-26 DIAGNOSIS — I5022 Chronic systolic (congestive) heart failure: Secondary | ICD-10-CM | POA: Insufficient documentation

## 2020-07-26 DIAGNOSIS — I079 Rheumatic tricuspid valve disease, unspecified: Secondary | ICD-10-CM

## 2020-07-26 DIAGNOSIS — Z7901 Long term (current) use of anticoagulants: Secondary | ICD-10-CM | POA: Insufficient documentation

## 2020-07-26 DIAGNOSIS — Z87891 Personal history of nicotine dependence: Secondary | ICD-10-CM | POA: Insufficient documentation

## 2020-07-26 NOTE — Patient Instructions (Signed)
It was great seeing you today!  We are not making any changes to your medications today.  You have a follow up appointment scheduled  If you have any questions or concerns before your next appointment please send [place] a message through [place] or call our office at [phone].    TO LEAVE A MESSAGE FOR THE NURSE SELECT OPTION 2, PLEASE LEAVE A MESSAGE INCLUDING:  YOUR NAME  DATE OF BIRTH  CALL BACK NUMBER  REASON FOR CALL**this is important as we prioritize the call backs  YOU WILL RECEIVE A CALL BACK THE SAME DAY AS LONG AS YOU CALL BEFORE 4:00 PM

## 2020-07-26 NOTE — Progress Notes (Signed)
PCP: Dr Alvis Lemmings Primary Cardiologist: Dr Gala Romney  ID: Dr Drue Second CT Surgery: Dr Cliffton Asters  HPI: Clifford Clark is a 29 year old with a history of h/o asthma and recent IVDU.  Admitted 04/2020 after being found down on the side of the road. ER w/u showed multiple septic emboli to lungs with cavitary lesions. 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 TEE with EF 60-65% small PFO. large TV vegetation with severe TR. No vegetations on Aov/MV despite left-sided emboli.Underwent AngioJet removal of part of vegetation. Then developed MRSA sepsis and respiratory failure. Intubated and started on pressors. F/u echo LVEF 20-25%. RV normal. Hospital course complicated by respiratory failure and septic shock. As he improved we repeated limited ECHO and EF was up to 35-40% but had ongoing vegetation. HF meds adjusted. Completed IV antibiotics prior to d/c. Placed on corlanor due to persistent tachycardia. Heart rate better controlled at d/c with heart rate in the 80s.   Saw Dr Cliffton Asters 07/22/20.Needs 3 more random drug screens and to talk with psychiatry to determine his current status and the drug rehab program.   Today he returns for HF follow up. He was seen 2 weeks ago and instructed to take 40 mg po lasix x 2 days. Overall feeling a little better. Says he is not as short of breath when walking. Says he paces hisself when he is doing things. Denies PND/Orthopnea. Appetite ok. No fever or chills. Weight at home 140-145 pounds. Taking all medications. He continues attend intensive outpatient therapy.   Echo 04/29/20  LVEF 50-55%. RV normal - TEE 7/16, LVEF normal 60-65%, RV normal - Limited Echo 7/24, LVEF severely reduced 20-25%, RV systolic function hyperdynamic. MV w/ mild-mod MR but no seen vegetation . Medium size 0.5 x 1.75 cm mobile filamentous mass on the tricupsid valve that prolapses across the valve plane, consistent with vegetation. The tricuspid valve is  abnormal. Tricuspid valve regurgitation  is severe.  -  Repeat limited echo 05/19/20 w/ interval improvement, LVEF up to 30-35%. Tricuspid valve vegetations are seen, measuring up to 1.8 x 0.8 cm attached to posterior leaflet and up to 1.8 x 1.0 cm attached to anterior  leaflet. The tricuspid valve is abnormal. Tricuspid valve regurgitation is  - ECHO 06/08/20 showed EF 35-40%, tricuspid valve with large vegetation. RV mildly reduced    ROS: All systems negative except as listed in HPI, PMH and Problem List.  SH:  Social History   Socioeconomic History  . Marital status: Single    Spouse name: Not on file  . Number of children: Not on file  . Years of education: Not on file  . Highest education level: Not on file  Occupational History  . Not on file  Tobacco Use  . Smoking status: Former Smoker    Packs/day: 0.50  . Smokeless tobacco: Never Used  . Tobacco comment: using nicotine patches  Vaping Use  . Vaping Use: Never used  Substance and Sexual Activity  . Alcohol use: Not Currently    Comment: rarely  . Drug use: Not Currently    Types: Marijuana, IV, "Crack" cocaine, Cocaine, Fentanyl, Methamphetamines, Amphetamines    Comment: Heroin  . Sexual activity: Yes  Other Topics Concern  . Not on file  Social History Narrative  . Not on file   Social Determinants of Health   Financial Resource Strain:   . Difficulty of Paying Living Expenses: Not on file  Food Insecurity:   .  Worried About Programme researcher, broadcasting/film/video in the Last Year: Not on file  . Ran Out of Food in the Last Year: Not on file  Transportation Needs:   . Lack of Transportation (Medical): Not on file  . Lack of Transportation (Non-Medical): Not on file  Physical Activity:   . Days of Exercise per Week: Not on file  . Minutes of Exercise per Session: Not on file  Stress:   . Feeling of Stress : Not on file  Social Connections:   . Frequency of Communication with Friends and Family: Not on file  . Frequency of  Social Gatherings with Friends and Family: Not on file  . Attends Religious Services: Not on file  . Active Member of Clubs or Organizations: Not on file  . Attends Banker Meetings: Not on file  . Marital Status: Not on file  Intimate Partner Violence:   . Fear of Current or Ex-Partner: Not on file  . Emotionally Abused: Not on file  . Physically Abused: Not on file  . Sexually Abused: Not on file    FH: No family history on file.  Past Medical History:  Diagnosis Date  . Asthma   . Opiate abuse, continuous (HCC)    Heroin    Current Outpatient Medications  Medication Sig Dispense Refill  . amoxicillin (AMOXIL) 500 MG capsule Take four capsules one hour before dental appointment. 4 capsule 3  . carvedilol (COREG) 6.25 MG tablet Take 1 tablet (6.25 mg total) by mouth 2 (two) times daily with a meal. 60 tablet 3  . clonazePAM (KLONOPIN) 0.5 MG tablet Take 0.5 mg by mouth daily.    Marland Kitchen doxycycline (VIBRA-TABS) 100 MG tablet Take 1 tablet (100 mg total) by mouth 2 (two) times daily. 60 tablet 0  . furosemide (LASIX) 40 MG tablet Take 40 mg by mouth as needed.    . ivabradine (CORLANOR) 7.5 MG TABS tablet Take 1 tablet (7.5 mg total) by mouth 2 (two) times daily with a meal. 60 tablet 3  . sacubitril-valsartan (ENTRESTO) 24-26 MG Take 1 tablet by mouth 2 (two) times daily. 60 tablet 3  . spironolactone (ALDACTONE) 25 MG tablet Take 1 tablet (25 mg total) by mouth daily. 30 tablet 3  . zolpidem (AMBIEN) 5 MG tablet Take 1 tablet (5 mg total) by mouth at bedtime as needed for sleep. 30 tablet 0   No current facility-administered medications for this encounter.    Vitals:   07/26/20 1135  BP: 105/65  Pulse: 77  SpO2: 100%  Weight: 67.9 kg (149 lb 12.8 oz)   Wt Readings from Last 3 Encounters:  07/26/20 67.9 kg (149 lb 12.8 oz)  07/22/20 67.1 kg (148 lb)  07/11/20 64.3 kg (141 lb 12.8 oz)   PHYSICAL EXAM: General:  Well appearing. No resp difficulty HEENT:  normal Neck: supple. no JVD. Carotids 2+ bilat; no bruits. No lymphadenopathy or thryomegaly appreciated. Cor: PMI nondisplaced. Regular rate & rhythm. No rubs, gallops . 2/6 TR  Lungs: clear Abdomen: soft, nontender, nondistended. No hepatosplenomegaly. No bruits or masses. Good bowel sounds. Extremities: no cyanosis, clubbing, rash, edema Neuro: alert & orientedx3, cranial nerves grossly intact. moves all 4 extremities w/o difficulty. Affect pleasant     ASSESSMENT & PLAN:  1. Chronic Systolic HF  Echo 04/29/20  LVEF 50-55%. RV normal - TEE 7/16, LVEF normal 60-65%, RV normal - Limited Echo 7/24, LVEF severely reduced 20-25%, RV systolic function hyperdynamic. MV w/ mild-mod MR but no seen  vegetation  - suspect acute drop in EF stress induced from septic shock. Doubt CAD - Repeat limited echo 7/29 w/ interval improvement, LVEF up to 30-35%. Given ongoing tachycardia ECHO was repeated and showed EF 35-40% with no large vegetation, RV mildly reduced.  - NYHA III. Volume status stable. Continue lasix as needed.  - Continue Spiro to 25 mg daily.  - Continue corlanor to 7.5 mg bid .  - Continue coreg 6.25 mg twice a day.   -Continue Entresto 24-26 mg bid. NO room to titrate with soft SBP.  - Repeat ECHO has been set up.   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 There was also amall flickering leaflet mass, suggestive of vegetation of the pulmonic valve.No mitral or aortic valve pathology. -s/p Angiovac debulking on 7/20 but still with large vegetation  - Completed IV antibiotics. On oral antibiotics.  - Followed closely by ID  -  Will eventually need TV repair if he is a candidate.  - He has had follow up with Dr Cliffton Asters.   - UDS negative 07/12/20  3. H/O IV Drug Use  UDS negative 07/11/20   Continuing in outpatient intensive drug  therapy.    Clifford Enck NP-C  11:50 AM

## 2020-07-29 NOTE — Telephone Encounter (Signed)
Can stephanie or myself seen him next week

## 2020-08-01 LAB — CULTURE, BLOOD (ROUTINE X 2)
Special Requests: ADEQUATE
Special Requests: ADEQUATE

## 2020-08-09 ENCOUNTER — Ambulatory Visit (INDEPENDENT_AMBULATORY_CARE_PROVIDER_SITE_OTHER): Payer: Self-pay | Admitting: Infectious Diseases

## 2020-08-09 ENCOUNTER — Other Ambulatory Visit: Payer: Self-pay

## 2020-08-09 VITALS — BP 100/63 | HR 59 | Temp 97.8°F | Wt 156.0 lb

## 2020-08-09 DIAGNOSIS — I76 Septic arterial embolism: Secondary | ICD-10-CM

## 2020-08-09 DIAGNOSIS — I079 Rheumatic tricuspid valve disease, unspecified: Secondary | ICD-10-CM

## 2020-08-09 DIAGNOSIS — R768 Other specified abnormal immunological findings in serum: Secondary | ICD-10-CM

## 2020-08-09 DIAGNOSIS — E440 Moderate protein-calorie malnutrition: Secondary | ICD-10-CM

## 2020-08-09 NOTE — Progress Notes (Signed)
Patient: Clifford Clark  DOB: [birth year] MRN: [id number] PCP: [place], [place]     Patient Active Problem List   Diagnosis Date Noted  . 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    Priority: High  . Malnutrition of moderate degree 04/29/2020    Priority: Medium  . Septic embolism ([place]) 04/28/2020    Priority: Medium  . Positive hepatitis C antibody test 08/10/2020    Priority: Low  . Acute systolic heart failure ([place])   . Elevated BUN 05/02/2020  . 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 Homeless 04/28/2020  . Hypokalemia 04/28/2020  . Hyponatremia 04/28/2020  . Encounter to establish care 04/02/2019     Subjective:   CC: Follow up tricuspid valve endocarditis.  Wants to get some suggestions about overall liver health.  Questions about exercise    HPI:  Here with his mother for follow up care.   Clifford Clark is been doing well since his last follow-up with me.  He completed his doxycycline course about 3 days ago now.  He has felt in general very well.  He states his fatigue is much better.  He has even tried to go back to exercising occasionally.  Was starting walking but tried to run the other day and had a hard time.  No cough or chest pain.  No fevers or chills.  Overall he feels like his functional capacity to complete daily tasks has improved.  He is taking all medications as directed by the heart failure clinic and other specialty care teams.  He has a follow-up echocardiogram coming up with Dr. Kipp Clark.  He is confused because he thought he remembered Dr. Kipp Clark say he needed surgery very urgently but now possible that he may not need surgery until he is 30 or 57.  He does have a follow-up appointment at the beginning of November and knows he probably needs some lab work for him then as well.  His weights have been pretty steady around 150.  He does notice some abdominal bloating at times.  Does not really take  furosemide much if at all.    Continues to do good work with aggressive substance abuse program.   Review of Systems  Constitutional: Negative for chills, diaphoresis, fever and malaise/fatigue.  HENT: Negative for congestion and sore throat.   Eyes: Negative for blurred vision.  Respiratory: Negative for cough, sputum production and shortness of breath.   Cardiovascular: Negative for chest pain.  Gastrointestinal: Negative for abdominal pain, nausea and vomiting.  Genitourinary: Negative for dysuria.  Musculoskeletal: Negative for back pain and joint pain.  Skin: Negative for rash.  Neurological: Negative for dizziness and weakness.  Psychiatric/Behavioral: Positive for depression. The patient is nervous/anxious.     Past Medical History:  Diagnosis Date  . Asthma   . Opiate abuse, continuous (HCC)    Heroin    Outpatient Medications Prior to Visit  Medication Sig Dispense Refill  . amoxicillin (AMOXIL) 500 MG capsule Take four capsules one hour before dental appointment. 4 capsule 3  . carvedilol (COREG) 6.25 MG tablet Take 1 tablet (6.25 mg total) by mouth 2 (two) times daily with a meal. 60 tablet 3  . clonazePAM (KLONOPIN) 0.5 MG tablet Take 0.5 mg by mouth daily.    . furosemide (LASIX) 40 MG tablet Take 40 mg by mouth as needed.    . ivabradine (CORLANOR) 7.5 MG TABS tablet Take 1 tablet (7.5 mg total) by mouth 2 (  two) times daily with a meal. 60 tablet 3  . sacubitril-valsartan (ENTRESTO) 24-26 MG Take 1 tablet by mouth 2 (two) times daily. 60 tablet 3  . sertraline (ZOLOFT) 50 MG tablet Take 50 mg by mouth daily.    Marland Kitchen spironolactone (ALDACTONE) 25 MG tablet Take 1 tablet (25 mg total) by mouth daily. 30 tablet 3  . doxycycline (VIBRA-TABS) 100 MG tablet Take 1 tablet (100 mg total) by mouth 2 (two) times daily. (Patient not taking: Reported on 08/09/2020) 60 tablet 0  . zolpidem (AMBIEN) 5 MG tablet Take 1 tablet (5 mg total) by mouth at bedtime as needed for sleep.  (Patient not taking: Reported on 08/09/2020) 30 tablet 0   No facility-administered medications prior to visit.     No Known Allergies  Social History   Tobacco Use  . Smoking status: Former Smoker    Packs/day: 0.50  . Smokeless tobacco: Never Used  . Tobacco comment: using nicotine patches  Vaping Use  . Vaping Use: Never used  Substance Use Topics  . Alcohol use: Not Currently    Comment: rarely  . Drug use: Not Currently    Types: Marijuana, IV, "Crack" cocaine, Cocaine, Fentanyl, Methamphetamines, Amphetamines    Comment: Heroin      Objective:   Vitals:   08/09/20 1523  BP: 100/63  Pulse: (!) 59  Temp: 97.8 F (36.6 C)  TempSrc: Oral  Weight: 156 lb (70.8 kg)   Body mass index is 24.43 kg/m.  Physical Exam Constitutional:      Appearance: He is well-developed.     Comments: Seated comfortably in chair  HENT:     Mouth/Throat:     Mouth: Mucous membranes are moist.     Dentition: Normal dentition. No dental abscesses.     Pharynx: Oropharynx is clear. No oropharyngeal exudate.  Eyes:     General: No scleral icterus.    Pupils: Pupils are equal, round, and reactive to light.  Cardiovascular:     Rate and Rhythm: Regular rhythm. Bradycardia present.     Heart sounds: Murmur (systolic) heard.   Pulmonary:     Effort: Pulmonary effort is normal. No respiratory distress.     Breath sounds: Normal breath sounds. No wheezing, rhonchi or rales.  Abdominal:     General: There is no distension.     Palpations: Abdomen is soft.     Tenderness: There is no abdominal tenderness.  Musculoskeletal:     Right lower leg: No edema.     Left lower leg: No edema.  Lymphadenopathy:     Cervical: No cervical adenopathy.  Skin:    General: Skin is warm and dry.     Capillary Refill: Capillary refill takes less than 2 seconds.     Findings: No rash.  Neurological:     Mental Status: He is alert and oriented to person, place, and time.  Psychiatric:         Judgment: Judgment normal.     Comments: In good spirits      Lab Results: Lab Results  Component Value Date   WBC 14.9 (H) 07/04/2020   HGB 11.6 (L) 07/04/2020   HCT 36.0 (L) 07/04/2020   MCV 88 07/04/2020   PLT 346 07/04/2020    Lab Results  Component Value Date   CREATININE 0.82 07/04/2020   BUN 15 07/04/2020   NA 134 07/04/2020   K 4.5 07/04/2020   CL 96 07/04/2020   CO2 22 07/04/2020  Lab Results  Component Value Date   ALT 114 (H) 05/25/2020   AST 84 (H) 05/25/2020   ALKPHOS 95 05/25/2020   BILITOT 0.4 05/25/2020     Assessment & Plan:   Problem List Items Addressed This Visit      High   Endocarditis of tricuspid valve - Primary    He completed extended course of treatment 3 days ago with Doxycycline after prolonged IV therapy while inpatient. He underwent angiovac procedure with Dr. Kipp Clark; unfortunately his case was complicated by severely decreased EF with severe valve destruction. He is working with Dr. Kipp Clark outpatient now for consideration of surgical repair. Has repeat echocardiogram coming up this week. I will have him return in 7-10d following stopping abx to check surveillance blood cultures, ESR, CBC to help with monitoring for cure of infection.   He seems to be doing pretty well on medical management with HF team, but not much room to increase meds. He is unclear if he needs surgery now or later from his last conversation with Dr. Kipp Clark.  I encouraged him to focus on what he can to improve his chance at a good outcome - ongoing substance cessation, avoiding smoking (he does not use tobacco products), focus on improving nutrition, exercise tolerance and muscle re-building. Will check with the heart failure team if he would be released to participate in cardiac rehab for supervised exercise. He has follow up with Dr. Kipp Clark early November.   Will follow up with him after blood cultures finalize.       Relevant Orders   Culture, blood  (single)   Culture, blood (single)   CBC   Sedimentation rate     Medium   Septic embolism (HCC)    CT scan recently showing significant improvement and resolution of many of his cavitary lung findings. No signs of empyema. He has since continued to improve with regards to his breathing. No further cough/chest pain.       Malnutrition of moderate degree    As above.         Low   Positive hepatitis C antibody test    Positive hepatitis C antibody test in the hospital with low level RNA of only 30 copies. I suspect he has cleared infection and this was blip/lab error. Will repeat at upcoming lab draw to help determine if he needs medication to treat chronic infection or not.  Discussed overall measures for liver health long term.       Relevant Orders   Hepatitis C RNA quantitative ([facility])     I spent 25 minutes with the patient and his mother in direct discussion of the above points.   Janene Madeira, MSN, NP-C [facility] [email or web address] Pager: [phone] Office: [place]: [phone]   08/10/20  12:28 PM

## 2020-08-09 NOTE — Patient Instructions (Signed)
Nice to see you are doing well.   Continue your heart medications as directed by the heart failure clinic. Your heart rate is much better and things sound to be going well.   Please plan to come back for a lab visit at the end of the week next week (Thursday or Friday if you can)  Will check your blood for bacteria and hepatitis C to see if you need any treatment for that.   I will be on the look out for your heart echocardiogram results.   I will see with the Heart Failure team if you would be OK to do Cardiac Rehab - this is supervised exercise that may be very helpful to you early on getting back to a routine for exercise.

## 2020-08-10 DIAGNOSIS — R768 Other specified abnormal immunological findings in serum: Secondary | ICD-10-CM | POA: Insufficient documentation

## 2020-08-10 DIAGNOSIS — R7689 Other specified abnormal immunological findings in serum: Secondary | ICD-10-CM | POA: Insufficient documentation

## 2020-08-10 NOTE — Assessment & Plan Note (Signed)
CT scan recently showing significant improvement and resolution of many of his cavitary lung findings. No signs of empyema. He has since continued to improve with regards to his breathing. No further cough/chest pain.

## 2020-08-10 NOTE — Assessment & Plan Note (Signed)
As above.

## 2020-08-10 NOTE — Assessment & Plan Note (Signed)
Positive hepatitis C antibody test in the hospital with low level RNA of only 30 copies. I suspect he has cleared infection and this was blip/lab error. Will repeat at upcoming lab draw to help determine if he needs medication to treat chronic infection or not.  Discussed overall measures for liver health long term.

## 2020-08-10 NOTE — Assessment & Plan Note (Addendum)
He completed extended course of treatment 3 days ago with Doxycycline after prolonged IV therapy while inpatient. He underwent angiovac procedure with Dr. Kipp Brood; unfortunately his case was complicated by severely decreased EF with severe valve destruction. He is working with Dr. Kipp Brood outpatient now for consideration of surgical repair. Has repeat echocardiogram coming up this week. I will have him return in 7-10d following stopping abx to check surveillance blood cultures, ESR, CBC to help with monitoring for cure of infection.   He seems to be doing pretty well on medical management with HF team, but not much room to increase meds. He is unclear if he needs surgery now or later from his last conversation with Dr. Kipp Brood.  I encouraged him to focus on what he can to improve his chance at a good outcome - ongoing substance cessation, avoiding smoking (he does not use tobacco products), focus on improving nutrition, exercise tolerance and muscle re-building. Will check with the heart failure team if he would be released to participate in cardiac rehab for supervised exercise. He has follow up with Dr. Kipp Brood early November.   Will follow up with him after blood cultures finalize.

## 2020-08-11 ENCOUNTER — Ambulatory Visit (HOSPITAL_COMMUNITY): Payer: Self-pay | Attending: Cardiology

## 2020-08-11 ENCOUNTER — Other Ambulatory Visit: Payer: Self-pay | Admitting: Infectious Diseases

## 2020-08-11 ENCOUNTER — Other Ambulatory Visit: Payer: Self-pay

## 2020-08-11 DIAGNOSIS — I5021 Acute systolic (congestive) heart failure: Secondary | ICD-10-CM

## 2020-08-11 DIAGNOSIS — I079 Rheumatic tricuspid valve disease, unspecified: Secondary | ICD-10-CM

## 2020-08-11 LAB — ECHOCARDIOGRAM COMPLETE
Area-P 1/2: 5.02 cm2
S' Lateral: 3.7 cm

## 2020-08-15 ENCOUNTER — Telehealth: Payer: Self-pay | Admitting: *Deleted

## 2020-08-15 MED FILL — CARVEDILOL 6.25 MG TABLET: 6.25 | 34 days supply | Qty: 68 | Fill #1

## 2020-08-15 MED FILL — SPIRONOLACTONE 25 MG TABS: 25 | 34 days supply | Qty: 34 | Fill #1

## 2020-08-15 NOTE — Telephone Encounter (Signed)
[name], pt's mother, called regarding pt's order for blood cx. Informed her that orders have been placed for Clifford Clark to have blood cultures drawn [facility] a week prior to his appointment 08/26/20. All questions answered.

## 2020-08-15 NOTE — Progress Notes (Signed)
It has been ordered. No problem.

## 2020-08-18 ENCOUNTER — Other Ambulatory Visit: Payer: Self-pay

## 2020-08-18 ENCOUNTER — Telehealth: Payer: Self-pay

## 2020-08-18 DIAGNOSIS — R768 Other specified abnormal immunological findings in serum: Secondary | ICD-10-CM

## 2020-08-18 DIAGNOSIS — I079 Rheumatic tricuspid valve disease, unspecified: Secondary | ICD-10-CM

## 2020-08-18 NOTE — Telephone Encounter (Signed)
Patient contacted the office and left a message stating that he could not afford to get his lab work, blood cultures, done at [facility] as they will charge him money that he does not have.  He stated that he is getting his lab work done at the ID appointment today.    Spoke with patient's mother, [name], who was advised that patient did not have to get blood cultures done twice.  However, advised her that he could have his future lab work done from our office at [facility] whichever is cheaper for patient, due to being medicaid pending.  She acknowledged receipt and advised that she would give the office a call back.

## 2020-08-23 ENCOUNTER — Encounter (HOSPITAL_COMMUNITY): Payer: Self-pay | Admitting: *Deleted

## 2020-08-23 ENCOUNTER — Ambulatory Visit: Payer: Self-pay | Admitting: Family Medicine

## 2020-08-23 ENCOUNTER — Other Ambulatory Visit: Payer: Self-pay | Admitting: *Deleted

## 2020-08-23 NOTE — Progress Notes (Signed)
Sounds good, thank you 

## 2020-08-23 NOTE — Progress Notes (Addendum)
PCP: Dr Alvis Lemmings Primary Cardiologist: Dr Gala Romney  ID: Dr Drue Second CT Surgery: Dr Cliffton Asters  HPI: Clifford Clark is a 29 year old with a history of h/o asthma, IVDA, TV endocarditis and systolic HF.  Admitted 04/2020 after being found down on the side of the road. ER w/u showed multiple septic emboli to lungs with cavitary lesions. 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 TEE with EF 60-65% small PFO. large TV vegetation with severe TR. No vegetations on Aov/MV despite left-sided emboli.Underwent AngioJet removal of part of vegetation. Then developed MRSA sepsis and respiratory failure. Intubated and started on pressors. F/u echo LVEF 20-25%. RV normal. Hospital course complicated by respiratory failure and septic shock. As he improved we repeated limited ECHO and EF was up to 35-40% but had ongoing vegetation. HF meds adjusted. Completed IV antibiotics prior to d/c. Placed on corlanor due to persistent tachycardia. Heart rate better controlled at d/c with heart rate in the 80s.   Saw Dr Cliffton Asters 07/22/20. Needs 3 more random drug screens and to talk with psychiatry to determine his current status and the drug rehab program.   Recently saw ID and CT chest showed resolution/improvement of septic emboli. Bcx negative.   Today he returns for HF follow up. Has finished antibiotics. Taking lasix a couple times per month for belly swelling. Remains active. Painting houses. Can walk 45 minutes without problem. Denies ETOH or drug use. No tobacco. Living with parents. Going to IOP classes 3x/week.   Echo 08/11/20 EF 45-50% moderate to severe TR (seems more moderate). RV ok. Personally reviewed   Echo 04/29/20  LVEF 50-55%. RV normal - TEE 7/16, LVEF normal 60-65%, RV normal - Limited Echo 7/24, LVEF severely reduced 20-25%, RV systolic function hyperdynamic. MV w/ mild-mod MR but no seen vegetation . Medium size 0.5 x 1.75 cm mobile filamentous mass on the  tricupsid valve that prolapses across the valve plane, consistent with vegetation. The tricuspid valve is abnormal. Tricuspid valve regurgitation  is severe.  -  Repeat limited echo 05/19/20 w/ interval improvement, LVEF up to 30-35%. Tricuspid valve vegetations are seen, measuring up to 1.8 x 0.8 cm attached to posterior leaflet and up to 1.8 x 1.0 cm attached to anterior  leaflet. The tricuspid valve is abnormal. Tricuspid valve regurgitation is  - ECHO 06/08/20 showed EF 35-40%, tricuspid valve with large vegetation. RV mildly reduced    ROS: All systems negative except as listed in HPI, PMH and Problem List.  SH:  Social History   Socioeconomic History  . Marital status: Single    Spouse name: Not on file  . Number of children: Not on file  . Years of education: Not on file  . Highest education level: Not on file  Occupational History  . Not on file  Tobacco Use  . Smoking status: Former Smoker    Packs/day: 0.50  . Smokeless tobacco: Never Used  . Tobacco comment: using nicotine patches  Vaping Use  . Vaping Use: Never used  Substance and Sexual Activity  . Alcohol use: Not Currently    Comment: rarely  . Drug use: Not Currently    Types: Marijuana, IV, "Crack" cocaine, Cocaine, Fentanyl, Methamphetamines, Amphetamines    Comment: Heroin  . Sexual activity: Yes  Other Topics Concern  . Not on file  Social History Narrative  . Not on file   Social Determinants of Health   Financial Resource Strain:   . Difficulty of  Paying Living Expenses: Not on file  Food Insecurity:   . Worried About Programme researcher, broadcasting/film/video in the Last Year: Not on file  . Ran Out of Food in the Last Year: Not on file  Transportation Needs:   . Lack of Transportation (Medical): Not on file  . Lack of Transportation (Non-Medical): Not on file  Physical Activity:   . Days of Exercise per Week: Not on file  . Minutes of Exercise per Session: Not on file  Stress:   . Feeling of Stress : Not on file   Social Connections:   . Frequency of Communication with Friends and Family: Not on file  . Frequency of Social Gatherings with Friends and Family: Not on file  . Attends Religious Services: Not on file  . Active Member of Clubs or Organizations: Not on file  . Attends Banker Meetings: Not on file  . Marital Status: Not on file  Intimate Partner Violence:   . Fear of Current or Ex-Partner: Not on file  . Emotionally Abused: Not on file  . Physically Abused: Not on file  . Sexually Abused: Not on file    FH: No family history on file.  Past Medical History:  Diagnosis Date  . Asthma   . Opiate abuse, continuous (HCC)    Heroin    Current Outpatient Medications  Medication Sig Dispense Refill  . amoxicillin (AMOXIL) 500 MG capsule Take four capsules one hour before dental appointment. 4 capsule 3  . carvedilol (COREG) 6.25 MG tablet Take 1 tablet (6.25 mg total) by mouth 2 (two) times daily with a meal. 60 tablet 3  . clonazePAM (KLONOPIN) 0.5 MG tablet Take 0.5 mg by mouth daily.    Marland Kitchen doxycycline (VIBRA-TABS) 100 MG tablet Take 1 tablet (100 mg total) by mouth 2 (two) times daily. (Patient not taking: Reported on 08/09/2020) 60 tablet 0  . furosemide (LASIX) 40 MG tablet Take 40 mg by mouth as needed.    . ivabradine (CORLANOR) 7.5 MG TABS tablet Take 1 tablet (7.5 mg total) by mouth 2 (two) times daily with a meal. 60 tablet 3  . sacubitril-valsartan (ENTRESTO) 24-26 MG Take 1 tablet by mouth 2 (two) times daily. 60 tablet 3  . sertraline (ZOLOFT) 50 MG tablet Take 50 mg by mouth daily.    Marland Kitchen spironolactone (ALDACTONE) 25 MG tablet Take 1 tablet (25 mg total) by mouth daily. 30 tablet 3  . zolpidem (AMBIEN) 5 MG tablet Take 1 tablet (5 mg total) by mouth at bedtime as needed for sleep. (Patient not taking: Reported on 08/09/2020) 30 tablet 0   No current facility-administered medications for this encounter.    Vitals:   08/25/20 1049  BP: 130/80  Pulse: 60   SpO2: 98%  Weight: 72 kg (158 lb 12.8 oz)   Wt Readings from Last 3 Encounters:  08/25/20 72 kg (158 lb 12.8 oz)  08/09/20 70.8 kg (156 lb)  07/26/20 67.9 kg (149 lb 12.8 oz)   PHYSICAL EXAM: General:  Well appearing. No resp difficulty HEENT: normal Neck: supple. no JVD. Carotids 2+ bilat; no bruits. No lymphadenopathy or thryomegaly appreciated. Cor: PMI nondisplaced. Regular rate & rhythm. Soft TR murmur Lungs: clear Abdomen: soft, nontender, nondistended. No hepatosplenomegaly. No bruits or masses. Good bowel sounds. Extremities: no cyanosis, clubbing, rash, edema Neuro: alert & orientedx3, cranial nerves grossly intact. moves all 4 extremities w/o difficulty. Affect pleasant   ASSESSMENT & PLAN:  1. Chronic Systolic HF  Echo  04/29/20  LVEF 50-55%. RV normal - TEE 7/16, LVEF normal 60-65%, RV normal - Limited Echo 7/24, LVEF severely reduced 20-25%, RV systolic function hyperdynamic. MV w/ mild-mod MR but no seen vegetation  - suspect acute drop in EF stress induced from septic shock. Doubt CAD - Repeat limited echo 7/29 w/ interval improvement, LVEF up to 30-35%. Given ongoing tachycardia ECHO was repeated and showed EF 35-40% with no large vegetation, RV mildly reduced.  - Echo 08/11/20 EF 45-50% moderate to severe TR (seems more moderate). RV ok. Personally reviewed - much improved NYHA I-II  Volume status stable. Continue lasix as needed.  - Continue Spiro to 25 mg daily.  - Decrease corlanor to 5 mg bid .  - Continue coreg 6.25 mg twice a day.   -Increase Entresto to 49/51 bid   - Repeat ECHO has been set up.   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 There was also amall flickering leaflet mass, suggestive of vegetation of the pulmonic valve.No mitral or aortic valve pathology. -s/p Angiovac debulking on 7/20 but  still with large vegetation  - Completed antibiotics.  - Followed closely by ID & TCTS - UDS negative 07/12/20 x 3 Will repeat today  - Doing much better. We discussed indications for TVR including persistent infection, RV failure and severe valvular damage. Currently doing well TR is moderate to severe but no RV failure. He has f/u with Dr. Cliffton Asters soon. From my standpoint ok with watchful waiting with close echo surveillance unless TCTS feels differently) - Discussed SBE prophylaxis at length (had recent dental visit and did not use)  3. H/O IV Drug Use  - UDS negative 07/11/20   - Continuing in outpatient intensive drug therapy.  - Repeat UDS today - discussed need to stay clean  Total time spent 45 minutes. Over half that time spent discussing above.    Arvilla Meres MD 11:19 PM

## 2020-08-23 NOTE — Progress Notes (Signed)
Blanchard Kelch, NP  Chelsea Aus, RN I will have my collaborating physician send in an order for cardiac rehab.   Yes he is OK to participate unsupervised - I just saw his recent echo from 10/21 and see his EF has improved (Was 25-30% in the hospital recently). That's great for him. If he can still participate that would be great so he can get some guidance.   Thank you.       Previous Messages   ----- Message -----  From: Chelsea Aus, RN  Sent: 08/12/2020  4:02 PM EDT  To: Blanchard Kelch, NP   Judeth Cornfield,   Thank you for this referral for this pt to participate in cardiac rehab. A couple of items to address prior to scheduling.   1. Referrals for Cardiac rehab must be signed by MD.  2. Due to pt insurance status he would be eligible for virtual cardiac rehab.  Pt are brought in person for pre assessment and post assessment. Pt will download an app to their smart device and long unsupervised exercise sessions. These sessions will be monitored by rehab staff and progress will be communicated through the app as chat messages.  Because the exercise is unsupervised, pt must be medically stable and have an understanding on how app work.   Based upon your assessment, do you believe this pt will be able to participate unsupervised? If so, please have the covering MD place referral for cardiac rehab. Typically with his high EF he would not qualify for cardiac rehab. Must have EF less than 35%. But because we are not billing insurance, he can participate.   Let me know your thoughts and how best to proceed   Thanks  Karlene Lineman RN, BSN  Cardiac and Pulmonary Rehab Nurse Navigator

## 2020-08-24 ENCOUNTER — Other Ambulatory Visit: Payer: Self-pay | Admitting: *Deleted

## 2020-08-24 DIAGNOSIS — I079 Rheumatic tricuspid valve disease, unspecified: Secondary | ICD-10-CM

## 2020-08-24 LAB — CULTURE, BLOOD (SINGLE)
MICRO NUMBER:: 11131110
MICRO NUMBER:: 11131111
Result:: NO GROWTH
SPECIMEN QUALITY:: ADEQUATE

## 2020-08-24 LAB — CBC
HCT: 46 % (ref 38.5–50.0)
Hemoglobin: 15.3 g/dL (ref 13.2–17.1)
MCH: 28.5 pg (ref 27.0–33.0)
MCHC: 33.3 g/dL (ref 32.0–36.0)
MCV: 85.8 fL (ref 80.0–100.0)
MPV: 10.5 fL (ref 7.5–12.5)
Platelets: 346 10*3/uL (ref 140–400)
RBC: 5.36 10*6/uL (ref 4.20–5.80)
RDW: 13.2 % (ref 11.0–15.0)
WBC: 6.9 10*3/uL (ref 3.8–10.8)

## 2020-08-24 LAB — HEPATITIS C RNA QUANTITATIVE
HCV RNA, PCR, QN (Log): 3.79 log IU/mL — ABNORMAL HIGH
HCV RNA, PCR, QN: 6150 IU/mL — ABNORMAL HIGH

## 2020-08-24 LAB — SEDIMENTATION RATE: Sed Rate: 2 mm/h (ref 0–15)

## 2020-08-24 NOTE — Progress Notes (Signed)
Called Clifford Clark & [name] answered the telephone. Instructed her that [name] is to have a urine drug screen performed today per Dr. Cliffton Asters. Order has been placed with [facility]. States [facility] charges $40 for UDS & would like for future lab collects to be performed at [facility] if they are cheaper than [facility]. Pt's mother understands & accepts receipt & will be taking [name] to [facility] today for collection.

## 2020-08-25 ENCOUNTER — Ambulatory Visit (HOSPITAL_COMMUNITY)
Admission: RE | Admit: 2020-08-25 | Discharge: 2020-08-25 | Disposition: A | Discharge status: Home / Self Care | Payer: Self-pay | Source: Ambulatory Visit | Attending: Internal Medicine | Admitting: Internal Medicine

## 2020-08-25 ENCOUNTER — Encounter (HOSPITAL_COMMUNITY): Payer: Self-pay | Admitting: Internal Medicine

## 2020-08-25 ENCOUNTER — Other Ambulatory Visit: Payer: Self-pay

## 2020-08-25 VITALS — BP 130/80 | HR 60 | Wt 158.8 lb

## 2020-08-25 DIAGNOSIS — F1121 Opioid dependence, in remission: Secondary | ICD-10-CM

## 2020-08-25 DIAGNOSIS — I5022 Chronic systolic (congestive) heart failure: Secondary | ICD-10-CM | POA: Insufficient documentation

## 2020-08-25 DIAGNOSIS — F1111 Opioid abuse, in remission: Secondary | ICD-10-CM | POA: Insufficient documentation

## 2020-08-25 DIAGNOSIS — F1910 Other psychoactive substance abuse, uncomplicated: Secondary | ICD-10-CM

## 2020-08-25 DIAGNOSIS — I079 Rheumatic tricuspid valve disease, unspecified: Secondary | ICD-10-CM | POA: Insufficient documentation

## 2020-08-25 DIAGNOSIS — Z79899 Other long term (current) drug therapy: Secondary | ICD-10-CM | POA: Insufficient documentation

## 2020-08-25 DIAGNOSIS — I2690 Septic pulmonary embolism without acute cor pulmonale: Secondary | ICD-10-CM | POA: Insufficient documentation

## 2020-08-25 DIAGNOSIS — Z87891 Personal history of nicotine dependence: Secondary | ICD-10-CM | POA: Insufficient documentation

## 2020-08-25 HISTORY — DX: Heart failure, unspecified: I50.9

## 2020-08-25 LAB — BASIC METABOLIC PANEL
Anion gap: 10 (ref 5–15)
BUN: 13 mg/dL (ref 6–20)
CO2: 24 mmol/L (ref 22–32)
Calcium: 9.9 mg/dL (ref 8.9–10.3)
Chloride: 104 mmol/L (ref 98–111)
Creatinine, Ser: 0.82 mg/dL (ref 0.61–1.24)
GFR, Estimated: >60 mL/min (ref >60)
Glucose, Bld: 89 mg/dL (ref 70–99)
Potassium: 4.5 mmol/L (ref 3.5–5.1)
Sodium: 138 mmol/L (ref 135–145)

## 2020-08-25 LAB — CBC
HCT: 45 % (ref 39.0–52.0)
Hemoglobin: 15.1 g/dL (ref 13.0–17.0)
MCH: 28.2 pg (ref 26.0–34.0)
MCHC: 33.6 g/dL (ref 30.0–36.0)
MCV: 84.1 fL (ref 80.0–100.0)
Platelets: 332 10*3/uL (ref 150–400)
RBC: 5.35 MIL/uL (ref 4.22–5.81)
RDW: 12.7 % (ref 11.5–15.5)
WBC: 7.4 10*3/uL (ref 4.0–10.5)
nRBC: 0 % (ref 0.0–0.2)

## 2020-08-25 LAB — DRUG MONITORING, PANEL 7 WITH CONFIRMATION, URINE
6 Acetylmorphine: NEGATIVE ng/mL (ref <10)
Alcohol Metabolites: NEGATIVE ng/mL
Amphetamines: NEGATIVE ng/mL (ref <500)
Barbiturates: NEGATIVE ng/mL (ref <300)
Benzodiazepines: NEGATIVE ng/mL (ref <100)
Cocaine Metabolite: NEGATIVE ng/mL (ref <150)
Creatinine: 92.9 mg/dL
Marijuana Metabolite: NEGATIVE ng/mL (ref <20)
Methadone Metabolite: NEGATIVE ng/mL (ref <100)
Opiates: NEGATIVE ng/mL (ref <100)
Oxidant: NEGATIVE ug/mL
Oxycodone: NEGATIVE ng/mL (ref <100)
pH: 5.8 (ref 4.5–9.0)

## 2020-08-25 LAB — BRAIN NATRIURETIC PEPTIDE: B Natriuretic Peptide: 20 pg/mL (ref 0.0–100.0)

## 2020-08-25 LAB — DM TEMPLATE

## 2020-08-25 MED ORDER — ENTRESTO 49-51 MG PO TABS: 1.0000 | ORAL_TABLET | Freq: Two times a day (BID) | ORAL | 3 refills | Status: DC

## 2020-08-25 MED ORDER — IVABRADINE HCL 5 MG PO TABS
5.0000 mg | ORAL_TABLET | Freq: Two times a day (BID) | ORAL | 6 refills | Status: DC
Start: 2020-08-25 — End: 2020-08-30

## 2020-08-25 NOTE — Patient Instructions (Signed)
Increase Entresto to 49/51 mg Twice daily   Decrease Corlanor to 5 mg Twice daily   Labs done today, your results will be available in MyChart, we will contact you for abnormal readings.  Your physician recommends that you schedule a follow-up appointment in: 2-3 months with echocardiogram  If you have any questions or concerns before your next appointment please send [place] a message through [place] or call our office at [phone].    TO LEAVE A MESSAGE FOR THE NURSE SELECT OPTION 2, PLEASE LEAVE A MESSAGE INCLUDING: . YOUR NAME . DATE OF BIRTH . CALL BACK NUMBER . REASON FOR CALL**this is important as we prioritize the call backs  YOU WILL RECEIVE A CALL BACK THE SAME DAY AS LONG AS YOU CALL BEFORE 4:00 PM  At the [facility], you and your health needs are our priority. As part of our continuing mission to provide you with exceptional heart care, we have created designated Provider Care Teams. These Care Teams include your primary Cardiologist (physician) and Advanced Practice Providers (APPs- Physician Assistants and Nurse Practitioners) who all work together to provide you with the care you need, when you need it.   You may see any of the following providers on your designated Care Team at your next follow up: Marland Kitchen Dr Arvilla Meres . Dr Marca Ancona . Tonye Becket, NP . Robbie Lis, PA . Karle Plumber, PharmD   Please be sure to bring in all your medications bottles to every appointment.

## 2020-08-26 ENCOUNTER — Ambulatory Visit (INDEPENDENT_AMBULATORY_CARE_PROVIDER_SITE_OTHER): Payer: Self-pay | Admitting: Thoracic Surgery (Cardiothoracic Vascular Surgery)

## 2020-08-26 ENCOUNTER — Encounter: Payer: Self-pay | Admitting: Thoracic Surgery (Cardiothoracic Vascular Surgery)

## 2020-08-26 VITALS — BP 117/67 | HR 69 | Temp 98.2°F | Resp 20 | Ht 67.0 in | Wt 158.0 lb

## 2020-08-26 DIAGNOSIS — I079 Rheumatic tricuspid valve disease, unspecified: Secondary | ICD-10-CM

## 2020-08-26 NOTE — Progress Notes (Signed)
      [address]       [place] [address]             [phone]        [facility] Medical Record #[id number] Date of Birth: Apr 03, 1991  Referring: Judyann Munson, MD Primary Care: [name] Cardiologist:No primary care provider on file.  Reason for visit:   follow-up  History of Present Illness:     Mr. Colaizzi comes in for another appointment.  From a respiratory status he has been doing well.  He is followed by the heart failure team and on his last echocardiogram there is no evidence of any vegetation on his tricuspid valve regurgitation was thought to be more consistent with moderate regurgitation.  Physical Exam: BP 117/67 (BP Location: Right Arm, Patient Position: Sitting, Cuff Size: Normal)   Pulse 69   Temp 98.2 F (36.8 C) (Skin)   Resp 20   Ht 5\' 7"  (1.702 m)   Wt 158 lb (71.7 kg)   SpO2 99% Comment: RA  BMI 24.75 kg/m   Alert NAD Easy work of breathing Abdomen soft     Assessment / Plan:   29 year old male with a history of tricuspid valve vegetation status post angio VAC debridement.  He is completed his antibiotic course, and blood cultures have been negative.  Additionally all of his random urine drug screens have been negative as well.  I discussed this case with Dr. 32 and given the fact that his tricuspid valve is warm moderate and there is no evidence of right heart failure we will postpone his surgery until he starts developing more heart failure symptoms.  He seems to be tolerating the tricuspid valve regurgitation without much difficulty.  He is scheduled to follow-up with another echocardiogram in January of next year.  If there are any issues then heart failure service will contact me.   February 08/26/2020 1:59 PM

## 2020-08-27 LAB — URINE DRUG PANEL 7
Amphetamines, Urine: NEGATIVE ng/mL
Barbiturate, Ur: NEGATIVE ng/mL
Benzodiazepine Quant, Ur: NEGATIVE ng/mL
Cannabinoid Quant, Ur: NEGATIVE ng/mL
Cocaine (Metab.): NEGATIVE ng/mL
Opiate Quant, Ur: NEGATIVE ng/mL
Phencyclidine, Ur: NEGATIVE ng/mL

## 2020-08-30 ENCOUNTER — Telehealth (HOSPITAL_COMMUNITY): Payer: Self-pay | Admitting: Pharmacist

## 2020-08-30 MED ORDER — IVABRADINE HCL 5 MG PO TABS
5.0000 mg | ORAL_TABLET | Freq: Two times a day (BID) | ORAL | 5 refills | Status: DC
Start: 2020-08-30 — End: 2021-05-22

## 2020-08-30 MED ORDER — ENTRESTO 49-51 MG PO TABS: 1.0000 | ORAL_TABLET | Freq: Two times a day (BID) | ORAL | 3 refills | Status: DC

## 2020-08-30 NOTE — Telephone Encounter (Signed)
Sent updated prescription refill for Entresto to [facility] and Pension scheme manager to [employer].   Karle Plumber, PharmD, BCPS, BCCP, CPP [facility] Pharmacist [phone]

## 2020-09-06 ENCOUNTER — Telehealth (HOSPITAL_COMMUNITY): Payer: Self-pay

## 2020-09-06 ENCOUNTER — Encounter (HOSPITAL_COMMUNITY): Payer: Self-pay

## 2020-09-06 NOTE — Telephone Encounter (Signed)
Called and left message with pt mother [name]. She stated she will give pt the message.  Mailed letter

## 2020-09-13 ENCOUNTER — Encounter (HOSPITAL_COMMUNITY): Payer: Self-pay

## 2020-09-14 ENCOUNTER — Other Ambulatory Visit (HOSPITAL_COMMUNITY): Payer: Self-pay

## 2020-09-16 MED FILL — SPIRONOLACTONE 25 MG TABS: 25 | 34 days supply | Qty: 34 | Fill #2

## 2020-09-16 MED FILL — CARVEDILOL 6.25 MG TABLET: 6.25 | 34 days supply | Qty: 68 | Fill #2

## 2020-09-20 ENCOUNTER — Telehealth (HOSPITAL_COMMUNITY): Payer: Self-pay

## 2020-09-20 NOTE — Telephone Encounter (Signed)
No response from pt.  Closed referral  

## 2020-10-16 ENCOUNTER — Ambulatory Visit (HOSPITAL_COMMUNITY)
Admission: RE | Admit: 2020-10-16 | Discharge: 2020-10-16 | Disposition: A | Discharge status: Home / Self Care | Payer: Self-pay | Attending: Psychiatry | Admitting: Psychiatry

## 2020-10-16 ENCOUNTER — Encounter (HOSPITAL_COMMUNITY): Payer: Self-pay | Admitting: Emergency Medicine

## 2020-10-16 ENCOUNTER — Inpatient Hospital Stay (HOSPITAL_COMMUNITY)
Admission: AD | Admit: 2020-10-16 | Discharge: 2020-10-21 | DRG: 897 | Disposition: A | Discharge status: Home / Self Care | Payer: Federal, State, Local not specified - Other | Source: Intra-hospital | Attending: Psychiatry | Admitting: Psychiatry

## 2020-10-16 ENCOUNTER — Emergency Department (HOSPITAL_COMMUNITY)
Admission: EM | Admit: 2020-10-16 | Discharge: 2020-10-16 | Disposition: A | Discharge status: Other Acute Inpatient Hospital | Payer: Self-pay | Attending: Emergency Medicine | Admitting: Emergency Medicine

## 2020-10-16 ENCOUNTER — Other Ambulatory Visit: Payer: Self-pay

## 2020-10-16 DIAGNOSIS — G8929 Other chronic pain: Secondary | ICD-10-CM | POA: Diagnosis present

## 2020-10-16 DIAGNOSIS — R45851 Suicidal ideations: Secondary | ICD-10-CM | POA: Insufficient documentation

## 2020-10-16 DIAGNOSIS — F419 Anxiety disorder, unspecified: Secondary | ICD-10-CM | POA: Diagnosis present

## 2020-10-16 DIAGNOSIS — Z8614 Personal history of Methicillin resistant Staphylococcus aureus infection: Secondary | ICD-10-CM | POA: Diagnosis not present

## 2020-10-16 DIAGNOSIS — Z79899 Other long term (current) drug therapy: Secondary | ICD-10-CM | POA: Diagnosis not present

## 2020-10-16 DIAGNOSIS — G4700 Insomnia, unspecified: Secondary | ICD-10-CM | POA: Diagnosis present

## 2020-10-16 DIAGNOSIS — F23 Brief psychotic disorder: Secondary | ICD-10-CM | POA: Diagnosis present

## 2020-10-16 DIAGNOSIS — F332 Major depressive disorder, recurrent severe without psychotic features: Secondary | ICD-10-CM | POA: Insufficient documentation

## 2020-10-16 DIAGNOSIS — Z87891 Personal history of nicotine dependence: Secondary | ICD-10-CM | POA: Insufficient documentation

## 2020-10-16 DIAGNOSIS — F1290 Cannabis use, unspecified, uncomplicated: Secondary | ICD-10-CM | POA: Insufficient documentation

## 2020-10-16 DIAGNOSIS — F1520 Other stimulant dependence, uncomplicated: Secondary | ICD-10-CM | POA: Insufficient documentation

## 2020-10-16 DIAGNOSIS — Z20822 Contact with and (suspected) exposure to covid-19: Secondary | ICD-10-CM | POA: Diagnosis present

## 2020-10-16 DIAGNOSIS — I5022 Chronic systolic (congestive) heart failure: Secondary | ICD-10-CM | POA: Insufficient documentation

## 2020-10-16 DIAGNOSIS — F1994 Other psychoactive substance use, unspecified with psychoactive substance-induced mood disorder: Secondary | ICD-10-CM | POA: Diagnosis present

## 2020-10-16 DIAGNOSIS — R7989 Other specified abnormal findings of blood chemistry: Secondary | ICD-10-CM

## 2020-10-16 DIAGNOSIS — I4510 Unspecified right bundle-branch block: Secondary | ICD-10-CM | POA: Diagnosis present

## 2020-10-16 DIAGNOSIS — J45909 Unspecified asthma, uncomplicated: Secondary | ICD-10-CM | POA: Insufficient documentation

## 2020-10-16 DIAGNOSIS — F1120 Opioid dependence, uncomplicated: Secondary | ICD-10-CM | POA: Insufficient documentation

## 2020-10-16 DIAGNOSIS — F1490 Cocaine use, unspecified, uncomplicated: Secondary | ICD-10-CM | POA: Insufficient documentation

## 2020-10-16 DIAGNOSIS — R443 Hallucinations, unspecified: Secondary | ICD-10-CM | POA: Insufficient documentation

## 2020-10-16 DIAGNOSIS — F19959 Other psychoactive substance use, unspecified with psychoactive substance-induced psychotic disorder, unspecified: Secondary | ICD-10-CM | POA: Diagnosis not present

## 2020-10-16 DIAGNOSIS — Z5900 Homelessness unspecified: Secondary | ICD-10-CM | POA: Insufficient documentation

## 2020-10-16 LAB — CBC WITH DIFFERENTIAL/PLATELET
Abs Immature Granulocytes: 0.02 10*3/uL (ref 0.00–0.07)
Basophils Absolute: 0.1 10*3/uL (ref 0.0–0.1)
Basophils Relative: 1 %
Eosinophils Absolute: 0.2 10*3/uL (ref 0.0–0.5)
Eosinophils Relative: 3 %
HCT: 45.7 % (ref 39.0–52.0)
Hemoglobin: 15.6 g/dL (ref 13.0–17.0)
Immature Granulocytes: 0 %
Lymphocytes Relative: 35 %
Lymphs Abs: 2.4 10*3/uL (ref 0.7–4.0)
MCH: 28.8 pg (ref 26.0–34.0)
MCHC: 34.1 g/dL (ref 30.0–36.0)
MCV: 84.3 fL (ref 80.0–100.0)
Monocytes Absolute: 0.8 10*3/uL (ref 0.1–1.0)
Monocytes Relative: 11 %
Neutro Abs: 3.3 10*3/uL (ref 1.7–7.7)
Neutrophils Relative %: 50 %
Platelets: 402 10*3/uL — ABNORMAL HIGH (ref 150–400)
RBC: 5.42 MIL/uL (ref 4.22–5.81)
RDW: 13.7 % (ref 11.5–15.5)
WBC: 6.8 10*3/uL (ref 4.0–10.5)
nRBC: 0 % (ref 0.0–0.2)

## 2020-10-16 LAB — COMPREHENSIVE METABOLIC PANEL
ALT: 215 U/L — ABNORMAL HIGH (ref 0–44)
AST: 69 U/L — ABNORMAL HIGH (ref 15–41)
Albumin: 4.7 g/dL (ref 3.5–5.0)
Alkaline Phosphatase: 73 U/L (ref 38–126)
Anion gap: 8 (ref 5–15)
BUN: 23 mg/dL — ABNORMAL HIGH (ref 6–20)
CO2: 25 mmol/L (ref 22–32)
Calcium: 9.8 mg/dL (ref 8.9–10.3)
Chloride: 103 mmol/L (ref 98–111)
Creatinine, Ser: 0.98 mg/dL (ref 0.61–1.24)
GFR, Estimated: >60 mL/min (ref >60)
Glucose, Bld: 102 mg/dL — ABNORMAL HIGH (ref 70–99)
Potassium: 4 mmol/L (ref 3.5–5.1)
Sodium: 136 mmol/L (ref 135–145)
Total Bilirubin: 0.9 mg/dL (ref 0.3–1.2)
Total Protein: 8 g/dL (ref 6.5–8.1)

## 2020-10-16 LAB — RESP PANEL BY RT-PCR (FLU A&B, COVID) ARPGX2
Influenza A by PCR: NEGATIVE
Influenza B by PCR: NEGATIVE
SARS Coronavirus 2 by RT PCR: NEGATIVE

## 2020-10-16 LAB — RAPID URINE DRUG SCREEN, HOSP PERFORMED
Amphetamines: POSITIVE — AB
Barbiturates: NOT DETECTED
Benzodiazepines: NOT DETECTED
Cocaine: NOT DETECTED
Opiates: NOT DETECTED
Tetrahydrocannabinol: NOT DETECTED

## 2020-10-16 LAB — ETHANOL: Alcohol, Ethyl (B): <10 mg/dL (ref <10)

## 2020-10-16 MED ORDER — OLANZAPINE 5 MG PO TBDP
5.0000 mg | ORAL_TABLET | Freq: Three times a day (TID) | ORAL | Status: DC | PRN
  Filled 2020-10-16: qty 1

## 2020-10-16 MED ORDER — HYDROXYZINE HCL 25 MG PO TABS
25.0000 mg | ORAL_TABLET | Freq: Three times a day (TID) | ORAL | Status: DC | PRN
  Filled 2020-10-16: qty 10
  Filled 2020-10-16: qty 1

## 2020-10-16 MED ORDER — LORAZEPAM 1 MG PO TABS
1.0000 mg | ORAL_TABLET | ORAL | Status: AC | PRN
  Administered 2020-10-17: 18:00:00 1 mg via ORAL
  Filled 2020-10-16 (×2): qty 1

## 2020-10-16 MED ORDER — ZIPRASIDONE MESYLATE 20 MG IM SOLR
20.0000 mg | INTRAMUSCULAR | Status: AC | PRN
  Administered 2020-10-17: 08:00:00 20 mg via INTRAMUSCULAR
  Filled 2020-10-16: qty 20

## 2020-10-16 MED ORDER — TRAZODONE HCL 50 MG PO TABS
50.0000 mg | ORAL_TABLET | Freq: Every evening | ORAL | Status: DC | PRN
  Administered 2020-10-20: 21:00:00 50 mg via ORAL
  Filled 2020-10-16: qty 1
  Filled 2020-10-16: qty 7
  Filled 2020-10-16: qty 1

## 2020-10-16 MED ORDER — MAGNESIUM HYDROXIDE 400 MG/5ML PO SUSP: 30.0000 mL | Freq: Every day | ORAL | Status: DC | PRN

## 2020-10-16 MED ORDER — ACETAMINOPHEN 325 MG PO TABS: 650.0000 mg | ORAL_TABLET | Freq: Four times a day (QID) | ORAL | Status: DC | PRN

## 2020-10-16 MED ORDER — ALUM & MAG HYDROXIDE-SIMETH 200-200-20 MG/5ML PO SUSP: 30.0000 mL | ORAL | Status: DC | PRN

## 2020-10-16 NOTE — H&P (Signed)
Behavioral Health Medical Screening Exam  Clifford Clark is an 29 y.o. male.  Presented with confusion and chest pain.  Multiple cardiac issues and use of meth recently.  Confused on assessment, suicidal with a plan at times, disorganized.  Sent to [facility] for medical clearance and cardiac assessment related to his recent valve replacement.  Total Time spent with patient: 45 minutes  Psychiatric Specialty Exam: Physical Exam Vitals and nursing note reviewed.  Constitutional:      Appearance: Normal appearance.  HENT:     Head: Normocephalic.     Nose: Nose normal.  Pulmonary:     Effort: Pulmonary effort is normal.  Musculoskeletal:        General: Normal range of motion.     Cervical back: Normal range of motion.  Neurological:     Mental Status: He is alert.  Psychiatric:        Attention and Perception: Attention normal.        Mood and Affect: Mood is anxious and depressed. Affect is blunt.        Speech: Speech normal.        Behavior: Behavior is slowed.        Thought Content: Thought content includes suicidal ideation. Thought content includes suicidal plan.        Cognition and Memory: Cognition is impaired. Memory is impaired.        Judgment: Judgment is inappropriate.    Review of Systems  Cardiovascular: Positive for chest pain.  Psychiatric/Behavioral: Positive for confusion, decreased concentration and dysphoric mood. The patient is nervous/anxious.   All other systems reviewed and are negative.  There were no vitals taken for this visit.There is no height or weight on file to calculate BMI. General Appearance: Casual Eye Contact:  Good Speech:  Normal Rate Volume:  Normal Mood:  Anxious and Depressed Affect:  Blunt Thought Process:  Disorganized Orientation:  Other:  person Thought Content:  rambling Suicidal Thoughts:  Yes.  with intent/plan Homicidal Thoughts:  No Memory:  Immediate;   Poor Recent;   Poor Remote;   Poor Judgement:   Poor Insight:  Fair Psychomotor Activity:  Normal Concentration: Concentration: Fair and Attention Span: Fair Recall:  [employer] of Knowledge:Fair Language: Fair Akathisia:  No Handed:  Right AIMS (if indicated):    Assets:  Leisure Time Resilience Social Support Sleep:     Musculoskeletal: Strength & Muscle Tone: within normal limits Gait & Station: normal Patient leans: N/A  Vital signs 113/59, 98.4, 78, 18, 98% room air  Recommendations: Based on my evaluation the patient does not appear to have an emergency medical condition.  Nanine Means, NP 10/16/2020, 5:23 PM

## 2020-10-16 NOTE — ED Notes (Signed)
Patient ambulatory back to [facility] A bed after tele-psych assessment

## 2020-10-16 NOTE — ED Triage Notes (Signed)
Patient reports suicidal ideation x2 days. Reports taking Zoloft daily but states it is not working. Denies suicidal plan. Denies HI. Patient states he is homeless at this time.

## 2020-10-16 NOTE — ED Provider Notes (Signed)
[facility]-EMERGENCY DEPT Provider Note   CSN: [id number] Arrival date & time: 10/16/20  1725     History No chief complaint on file.   Clifford Clark is a 29 y.o. male.  Patient is a 29 year old male who has a history of heroin abuse and prior endocarditis/CHF who presents with suicidal ideations.  He states that he needs to be admitted to the ICU.  I asked him why, he says that he is going to kill himself.  He says that he is having thoughts of killing himself and he is sure that it is going to happen.  He says he has a plan but he cannot tell me what the plan is.  He says he is having hallucinations but cannot specify what kind of hallucinations.  He does admit to heroin and methamphetamine use with the last day being about 2 days.  He says he has a history of depression but is off of his antidepressant medications.  He denies any current alcohol use.  He does not have any fevers or any other recent medical illnesses.        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   Homeless 04/28/2020   Hypokalemia 04/28/2020   Hyponatremia 04/28/2020   Encounter to establish care 04/02/2019    Past Surgical History:  Procedure Laterality Date   APPLICATION OF ANGIOVAC N/A 05/10/2020   Procedure: APPLICATION OF Johnn Hai;  Surgeon: [name], MD;  Location: [facility] OR;  Service: Vascular;  Laterality: N/A;   BUBBLE STUDY  05/06/2020   Procedure: BUBBLE STUDY;  Surgeon: [name], MD;  Location:  [facility] ENDOSCOPY;  Service: Cardiovascular;;   I & D EXTREMITY Bilateral 05/03/2020   Procedure: BILATERAL IRRIGATION AND DEBRIDEMENT HANDS;  Surgeon: [name], MD;  Location: [facility] ORS;  Service: Orthopedics;  Laterality: Bilateral;   MULTIPLE EXTRACTIONS WITH ALVEOLOPLASTY N/A 06/14/2020   Procedure: Surgical extraction of tooth #'s 3 and 28 with alveoloplasty and gross debridement of remaining dentition.;  Surgeon: [name], DDS;  Location: [facility] OR;  Service: Oral Surgery;  Laterality: N/A;   TEE WITHOUT CARDIOVERSION N/A 05/06/2020   Procedure: TRANSESOPHAGEAL ECHOCARDIOGRAM (TEE);  Surgeon: [name], MD;  Location: [facility] ENDOSCOPY;  Service: Cardiovascular;  Laterality: N/A;   TEE WITHOUT CARDIOVERSION N/A 05/10/2020   Procedure: TRANSESOPHAGEAL ECHOCARDIOGRAM (TEE);  Surgeon: [name], MD;  Location: [facility] OR;  Service: Thoracic;  Laterality: N/A;       History reviewed. No pertinent family history.  Social History   Tobacco Use   Smoking status: Former Smoker    Packs/day: 0.50   Smokeless tobacco: Never Used  Building services engineer Use: Never used  Substance Use Topics   Alcohol use: Not Currently    Comment: rarely   Drug use: Not Currently    Types: Marijuana, IV, "Crack" cocaine, Cocaine, Fentanyl, Methamphetamines, Amphetamines    Comment: Heroin    Home Medications Prior to Admission medications   Medication Sig Start  Date End Date Taking? Authorizing Provider  amoxicillin (AMOXIL) 500 MG capsule Take four capsules one hour before dental appointment. 06/21/20   [name], DDS  carvedilol (COREG) 6.25 MG tablet Take 1 tablet (6.25 mg total) by mouth 2 (two) times daily with a meal. 06/17/20   Marinda Elk, MD  clonazePAM (KLONOPIN) 0.5 MG tablet Take 0.5 mg by mouth daily.    [provider]  furosemide (LASIX) 40 MG tablet Take 40 mg by mouth as needed.    [provider]  gabapentin (NEURONTIN) 600 MG  tablet Take 600 mg by mouth 3 (three) times daily.    [provider]  ivabradine (CORLANOR) 5 MG TABS tablet Take 1 tablet (5 mg total) by mouth 2 (two) times daily with a meal. 08/30/20   Bensimhon, [name], MD  sacubitril-valsartan (ENTRESTO) 49-51 MG Take 1 tablet by mouth 2 (two) times daily. 08/30/20   Bensimhon, [name], MD  sertraline (ZOLOFT) 50 MG tablet Take 50 mg by mouth daily. 08/01/20   [provider]  spironolactone (ALDACTONE) 25 MG tablet Take 1 tablet (25 mg total) by mouth daily. 06/17/20   Marinda Elk, MD    Allergies    Patient has no known allergies.  Review of Systems   Review of Systems  Constitutional: Negative for chills, diaphoresis, fatigue and fever.  HENT: Negative for congestion, rhinorrhea and sneezing.   Eyes: Negative.   Respiratory: Negative for cough, chest tightness and shortness of breath.   Cardiovascular: Negative for chest pain and leg swelling.  Gastrointestinal: Negative for abdominal pain, blood in stool, diarrhea, nausea and vomiting.  Genitourinary: Negative for difficulty urinating, flank pain, frequency and hematuria.  Musculoskeletal: Negative for arthralgias and back pain.  Skin: Negative for rash.  Neurological: Negative for dizziness, speech difficulty, weakness, numbness and headaches.  Psychiatric/Behavioral: Positive for hallucinations and suicidal ideas.    Physical Exam Updated Vital Signs BP 120/73 (BP Location: Right Arm)    Pulse 75    Temp 99.4 F (37.4 C) (Oral)    Resp 18    SpO2 96%   Physical Exam Constitutional:      Appearance: He is well-developed and well-nourished.  HENT:     Head: Normocephalic and atraumatic.  Eyes:     Pupils: Pupils are equal, round, and reactive to light.  Cardiovascular:     Rate and Rhythm: Normal rate and regular rhythm.     Heart sounds: Normal heart sounds.  Pulmonary:     Effort: Pulmonary effort is normal. No respiratory distress.     Breath sounds:  Normal breath sounds. No wheezing or rales.  Chest:     Chest wall: No tenderness.  Abdominal:     General: Bowel sounds are normal.     Palpations: Abdomen is soft.     Tenderness: There is no abdominal tenderness. There is no guarding or rebound.  Musculoskeletal:        General: No edema. Normal range of motion.     Cervical back: Normal range of motion and neck supple.  Lymphadenopathy:     Cervical: No cervical adenopathy.  Skin:    General: Skin is warm and dry.     Findings: No rash.  Neurological:     General: No focal deficit present.     Mental Status: He is alert and oriented to person, place, and time.  Psychiatric:        Mood and Affect: Mood and affect normal.  Comments: Patient has some bizarre behavior and tangential thought process.     ED Results / Procedures / Treatments   Labs (all labs ordered are listed, but only abnormal results are displayed) Labs Reviewed  COMPREHENSIVE METABOLIC PANEL - Abnormal; Notable for the following components:      Result Value   Glucose, Bld 102 (*)    BUN 23 (*)    AST 69 (*)    ALT 215 (*)    All other components within normal limits  RAPID URINE DRUG SCREEN, HOSP PERFORMED - Abnormal; Notable for the following components:   Amphetamines POSITIVE (*)    All other components within normal limits  CBC WITH DIFFERENTIAL/PLATELET - Abnormal; Notable for the following components:   Platelets 402 (*)    All other components within normal limits  RESP PANEL BY RT-PCR (FLU A&B, COVID) [id number]  ETHANOL    EKG None  Radiology No results found.  Procedures Procedures (including critical care time)  Medications Ordered in ED Medications - No data to display  ED Course  I have reviewed the triage vital signs and the nursing notes.  Pertinent labs & imaging results that were available during my care of the patient were reviewed by me and considered in my medical decision making (see chart for details).    MDM  Rules/Calculators/A&P                          Patient is a 29 year old male who presents with suicidal ideations.  He does have somewhat of a bizarre affect and at times has disorganized and tangential thinking.  He is oriented x4.Marland Kitchen He does not have any focal neurologic deficits.  He was recently admitted the summer for endocarditis and pulmonary septic emboli.  He does not have any fever, chest pain or shortness of breath.  His white count is normal.  I do not hear a murmur.  He does not complain of any recent illnesses.  His LFTs are mildly elevated.  They have been in the past during his prior recent hospitalization but then normalized.  He says that he has been tested for hepatitis in the past and was told that he did have hepatitis that needed treatment but he has not sought treatment yet.  He does not have any associate abdominal pain.  He is medically cleared and awaiting TTS evaluation. Final Clinical Impression(s) / ED Diagnoses Final diagnoses:  Suicidal ideation    Rx / DC Orders ED Discharge Orders    None       Rolan Bucco, MD 10/16/20 1946

## 2020-10-16 NOTE — ED Provider Notes (Signed)
[facility] to [facility].  He has been notified that his LFTs are mildly elevated and need outpatient follow-up.   Rolan Bucco, MD 10/16/20 2038

## 2020-10-16 NOTE — ED Notes (Signed)
Called safe T transport to transport patient to [facility]

## 2020-10-16 NOTE — ED Notes (Signed)
Report called to [facility] @ [facility]. Will arrange transport for 2300. Patient signed Voluntary admission consent.

## 2020-10-16 NOTE — ED Notes (Signed)
patient moved to ED room 3 to perform tele-psych assessment

## 2020-10-16 NOTE — ED Notes (Signed)
EDMD at bedside

## 2020-10-16 NOTE — BH Assessment (Signed)
Comprehensive Clinical Assessment (CCA) Note  10/16/2020 Clifford Clark [id number]  Pt is a 29 year old single male who present to [facility] ED accompanied by his mother, who did not participate in assessment. Pt initially presented at [facility] and was transferred to [facility] for medical clearance. Pt states that he used methamphetamines prior to presenting to [facility] and Pt appears impaired. He presents with disorganized thought process and answers simple questions appropriately but frequently makes unrelated comments. Pt says that he was "desperated and needed to calm down" so he was "tricked" into taking methamphetamines and possibly other substances. Pt states he has a history of using heroin and other drugs. He repeatedly states he is remorseful and need to earn the respect of his mother. 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 Pt says he has been sleeping very little. He reports eating approximately one meal per day. During assessment he denies current suicidal ideation, however Pt told Nanine Means, NP at [facility] and Dr. Rolan Bucco at [facility] that he was suicidal with a plan. During assessment Pt states that he has accidentally overdosed on drugs in the past. He denies current homicidal ideation or history of violence. He denies current auditory or visual hallucinations but told Dr. Fredderick Phenix he is experiencing unspecified hallucinations.  Pt cannot give details about his substance use. He cannot recall the last time he used heroin. Pt says he does not use alcohol. Pt's urine drug screen is positive for amphetamines.  Pt reports he is currently homeless and implies that he wants to live with his mother but needs to "earn her respect." He is current unemployed. Pt has a history of cardiac problems. He says he is currently on probation for two DWi convictions and is possibly facing a third DWI charge. He states his probation officer is Sports coach. He denies history of abuse or trauma. He  denies access to firearms.   Pt says he is prescribed Zoloft but has not been taking it recently. Pt's medical record indicates he was at [facility] facility based crisis in May 2021 and was inpatient at [facility] in April 2020. Pt currently has difficulty with memory and concentration and is unable to give a history of treatment.  Pt is casually dressed, alert and oriented x4. Pt speaks in a clear tone, at moderate volume and normal pace. Motor behavior appears normal. Eye contact is good and Pt is at times tearful. Pt's mood is depressed and anxious, affect is congruent with mood. Thought process is disorganized. Pt's memory appears impaired. Insight and judgment are impaired. Pt was cooperative throughout assessment. He states he is willing to sign voluntarily into a psychiatric facility.   Chief Complaint: No chief complaint on file.  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 Brook Beaman, [facility] at [facility], confirmed bed availability after 2300. Pt is accepted to the service of Dr. Jola Babinski, room 307-1. Notified Dr Rolan Bucco, Sherian Rein, RN and Duwaine Maxin, RN of acceptance.   PHQ9 SCORE ONLY 10/16/2020 07/11/2020 07/04/2020  PHQ-9 Total Score CCA Screening, Triage and Referral (STR)  Patient Reported Information How did you hear about [place]? No data recorded Referral name: No data recorded Referral phone number: No data recorded  Whom do you see for routine medical problems? No data  recorded Practice/Facility Name: No data recorded Practice/Facility Phone Number: No data recorded Name of Contact: No data recorded Contact Number: No data recorded Contact Fax Number: No data recorded Prescriber Name: No data recorded Prescriber Address (if known): No data  recorded  What Is the Reason for Your Visit/Call Today? No data recorded How Long Has This Been Causing You Problems? No data recorded What Do You Feel Would Help You the Most Today? No data recorded  Have You Recently Been in Any Inpatient Treatment (Hospital/Detox/Crisis Center/28-Day Program)? No data recorded Name/Location of Program/Hospital:No data recorded How Long Were You There? No data recorded When Were You Discharged? No data recorded  Have You [facility]? No data recorded Who Do You See at [facility]? No data recorded  Have You Recently Had Any Thoughts About Hurting Yourself? No data recorded Are You Planning to Commit Suicide/Harm Yourself At This time? No data recorded  Have you Recently Had Thoughts About Hurting Someone Karolee Ohslse? No data recorded Explanation: No data recorded  Have You Used Any Alcohol or Drugs in the Past 24 Hours? No data recorded How Long Ago Did You Use Drugs or Alcohol? No data recorded What Did You Use and How Much? No data recorded  Do You Currently Have a Therapist/Psychiatrist? No data recorded Name of Therapist/Psychiatrist: No data recorded  Have You Been Recently Discharged From Any Office Practice or Programs? No data recorded Explanation of Discharge From Practice/Program: No data recorded    CCA Screening Triage Referral Assessment Type of Contact: No data recorded Is this Initial or Reassessment? No data recorded Date Telepsych consult ordered in [facility]:  No data recorded Time Telepsych consult ordered in [facility]:  No data recorded  Patient Reported Information Reviewed? No data recorded Patient Left Without Being Seen? No data recorded Reason for Not Completing Assessment: No data recorded  Collateral Involvement: No data recorded  Does Patient Have a Court Appointed Legal Guardian? No data recorded Name and Contact of Legal Guardian: No data recorded If Minor and Not Living with Parent(s), Who  has Custody? No data recorded Is CPS involved or ever been involved? No data recorded Is APS involved or ever been involved? No data recorded  Patient Determined To Be At Risk for Harm To Self or Others Based on Review of Patient Reported Information or Presenting Complaint? No data recorded Method: No data recorded Availability of Means: No data recorded Intent: No data recorded Notification Required: No data recorded Additional Information for Danger to Others Potential: No data recorded Additional Comments for Danger to Others Potential: No data recorded Are There Guns or Other Weapons in Your Home? No data recorded Types of Guns/Weapons: No data recorded Are These Weapons Safely Secured?                            No data recorded Who Could Verify You Are Able To Have These Secured: No data recorded Do You Have any Outstanding Charges, Pending Court Dates, Parole/Probation? No data recorded Contacted To Inform of Risk of Harm To Self or Others: No data recorded  Location of Assessment: No data recorded  Does Patient Present under Involuntary Commitment? No data recorded IVC Papers Initial File Date: No data recorded  [place] data recorded  Patient Currently Receiving the Following Services: No data recorded  Determination of Need: No data recorded  Options For Referral: No data recorded  CCA Biopsychosocial Intake/Chief Complaint:  Pt appears under the influence of methamphetamines with disorganized thought process and suicidal ideation.  Current Symptoms/Problems: Pt reports he used methamphetamines before coming to the hospital. He appears disorganized, depressed and remorseful.   Patient Reported Schizophrenia/Schizoaffective Diagnosis in Past: No   Strengths: Pt appear motivated for treatment  Preferences: Pt wants to speak with his probation officer.  Abilities: NA   Type of Services Patient Feels are Needed: Pt does not know.   Initial  Clinical Notes/Concerns: Pt is a poor historian.   Mental Health Symptoms Depression:  Difficulty Concentrating; Change in energy/activity; Fatigue; Hopelessness; Increase/decrease in appetite; Sleep (too much or little); Tearfulness; Worthlessness   Duration of Depressive symptoms: Greater than two weeks   Mania:  Change in energy/activity   Anxiety:   Difficulty concentrating; Fatigue; Restlessness; Sleep; Tension; Worrying   Psychosis:  Grossly disorganized speech   Duration of Psychotic symptoms: Less than six months   Trauma:  None   Obsessions:  None   Compulsions:  None   Inattention:  None   Hyperactivity/Impulsivity:  N/A   Oppositional/Defiant Behaviors:  N/A   Emotional Irregularity:  None   Other Mood/Personality Symptoms:  NA    Mental Status Exam Appearance and self-care  Stature:  Average   Weight:  Average weight   Clothing:  Casual   Grooming:  Normal   Cosmetic use:  None   Posture/gait:  Normal   Motor activity:  Not Remarkable   Sensorium  Attention:  Normal   Concentration:  Variable   Orientation:  X5   Recall/memory:  Defective in Short-term   Affect and Mood  Affect:  Anxious; Depressed   Mood:  Anxious; Depressed; Other (Comment) (Remorseful)   Relating  Eye contact:  Normal   Facial expression:  Anxious; Sad   Attitude toward examiner:  Cooperative   Thought and Language  Speech flow: Flight of Ideas   Thought content:  Appropriate to Mood and Circumstances   Preoccupation:  Religion   Hallucinations:  None   Organization:  No data recorded  [employer] of Knowledge:  Average   Intelligence:  Average   Abstraction:  Normal   Judgement:  Impaired   Reality Testing:  Variable   Insight:  Lacking   Decision Making:  Location manager   Social Functioning  Social Maturity:  Irresponsible   Social Judgement:  Normal   Stress  Stressors:  Work; Relationship; Armed forces operational officer; Housing   Coping  Ability:  Human resources officer Deficits:  None   Supports:  Family     Religion: Religion/Spirituality Are You A Religious Person?: Yes What is Your Religious Affiliation?: Christian How Might This Affect Treatment?: NA  Leisure/Recreation: Leisure / Recreation Do You Have Hobbies?: No  Exercise/Diet: Exercise/Diet Do You Exercise?: Yes What Type of Exercise Do You Do?: Run/Walk How Many Times a Week Do You Exercise?: 1-3 times a week Have You Gained or Lost A Significant Amount of Weight in the Past Six Months?: No Do You Follow a Special Diet?: No Do You Have Any Trouble Sleeping?: Yes Explanation of Sleeping Difficulties: Decreased sleep   CCA Employment/Education Employment/Work Situation: Employment / Work Situation Employment situation: Unemployed Patient's job has been impacted by current illness: Yes Describe how patient's job has been impacted: ongoing heroin abuse for 7 years has made it difficult for him to find and maintain employment What is the longest time patient has a held a job?: pt declined to say Where was  the patient employed at that time?: pt declined to say Has patient ever been in the [employer]?: No  Education: Education Is Patient Currently Attending School?: No Last Grade Completed: 12 Did You Graduate From [employer]?: Yes Did You Attend College?: No Did You Attend Graduate School?: No Did You Have An Individualized Education Program (IIEP): No Did You Have Any Difficulty At School?: No Patient's Education Has Been Impacted by Current Illness: No   CCA Family/Childhood History Family and Relationship History: Family history Marital status: Single Are you sexually active?: No What is your sexual orientation?: heterosexual Has your sexual activity been affected by drugs, alcohol, medication, or emotional stress?: no.  Does patient have children?: No  Childhood History:  Childhood History By whom was/is the patient raised?: Both  parents Additional childhood history information: parents were married for most of his childhood. no issues in childhood per pt.  Description of patient's relationship with caregiver when they were a child: Close to mother, not close with father Patient's description of current relationship with people who raised him/her: Pt states he needs to earn his mother's respect. How were you disciplined when you got in trouble as a child/adolescent?: grounded; whoopings Does patient have siblings?: Yes Number of Siblings: 2 Description of patient's current relationship with siblings: pt is the youngest of three. he has two older siblings. "they don't have any substance abuse issues."  Did patient suffer any verbal/emotional/physical/sexual abuse as a child?: No Did patient suffer from severe childhood neglect?: No Has patient ever been sexually abused/assaulted/raped as an adolescent or adult?: No Was the patient ever a victim of a crime or a disaster?: No Witnessed domestic violence?: No Has patient been affected by domestic violence as an adult?: No  Child/Adolescent Assessment:     CCA Substance Use Alcohol/Drug Use: Alcohol / Drug Use Pain Medications: see MAR Prescriptions: see MAR Over the Counter: see MAR History of alcohol / drug use?: Yes Substance #1 Name of Substance 1: Methamphetamines 1 - Age of First Use: Approximately one year 1 - Amount (size/oz): unknown 1 - Frequency: unknown 1 - Duration: unknown 1 - Last Use / Amount: 10/16/2020 Substance #2 Name of Substance 2: Heroin 2 - Age of First Use: 20 2 - Amount (size/oz): Varies 2 - Frequency: Was taking daily 2 - Duration: Ongoing 2 - Last Use / Amount: unknown                     ASAM's:  Six Dimensions of Multidimensional Assessment  Dimension 1:  Acute Intoxication and/or Withdrawal Potential:   Dimension 1:  Description of individual's past and current experiences of substance use and withdrawal: Pt  reports history of heroin use. Now using methamphetamines.  Dimension 2:  Biomedical Conditions and Complications:   Dimension 2:  Description of patient's biomedical conditions and  complications: Pt has had recent cardiac surgery  Dimension 3:  Emotional, Behavioral, or Cognitive Conditions and Complications:  Dimension 3:  Description of emotional, behavioral, or cognitive conditions and complications: Pt has history of depression and anxiety  Dimension 4:  Readiness to Change:  Dimension 4:  Description of Readiness to Change criteria: Pt states he is motivated for treatment  Dimension 5:  Relapse, Continued use, or Continued Problem Potential:  Dimension 5:  Relapse, continued use, or continued problem potential critiera description: Pt has had difficulty maintaining sobriety  Dimension 6:  Recovery/Living Environment:  Dimension 6:  Recovery/Iiving environment criteria description: Pt is homeless  ASAM Severity  Score: ASAM's Severity Rating Score: 12  ASAM Recommended Level of Treatment: ASAM Recommended Level of Treatment: Level III Residential Treatment   Substance use Disorder (SUD) Substance Use Disorder (SUD)  Checklist Symptoms of Substance Use: Continued use despite having a persistent/recurrent physical/psychological problem caused/exacerbated by use,Continued use despite persistent or recurrent social, interpersonal problems, caused or exacerbated by use,Large amounts of time spent to obtain, use or recover from the substance(s),Persistent desire or unsuccessful efforts to cut down or control use,Presence of craving or strong urge to use,Recurrent use that results in a failure to fulfill major role obligations (work, school, home),Social, occupational, recreational activities given up or reduced due to use,Substance(s) often taken in larger amounts or over longer times than was intended  Recommendations for Services/Supports/Treatments: Recommendations for  Services/Supports/Treatments Recommendations For Services/Supports/Treatments: Facility Based Crisis  DSM5 Diagnoses: Patient Active Problem List   Diagnosis Date Noted  . Positive hepatitis C antibody test 08/10/2020  . Acute systolic heart failure (HCC)   . Elevated BUN 05/02/2020  . Malnutrition of moderate degree 04/29/2020  . 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 Septic embolism (HCC) 04/28/2020  . 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 Homeless 04/28/2020  . Hypokalemia 04/28/2020  . Hyponatremia 04/28/2020  . Encounter to establish care 04/02/2019    Patient Centered Plan: Patient is on the following Treatment Plan(s):  Anxiety, Depression and Substance Abuse   Referrals to Alternative Service(s): Referred to Alternative Service(s):   Place:   Date:   Time:    Referred to Alternative Service(s):   Place:   Date:   Time:    Referred to Alternative Service(s):   Place:   Date:   Time:    Referred to Alternative Service(s):   Place:   Date:   Time:     Pamalee Leyden, [facility]

## 2020-10-17 ENCOUNTER — Other Ambulatory Visit: Payer: Self-pay

## 2020-10-17 ENCOUNTER — Encounter (HOSPITAL_COMMUNITY): Payer: Self-pay | Admitting: Physician Assistant

## 2020-10-17 DIAGNOSIS — F1520 Other stimulant dependence, uncomplicated: Secondary | ICD-10-CM

## 2020-10-17 DIAGNOSIS — F19959 Other psychoactive substance use, unspecified with psychoactive substance-induced psychotic disorder, unspecified: Secondary | ICD-10-CM

## 2020-10-17 LAB — LIPID PANEL
Cholesterol: 177 mg/dL (ref 0–200)
HDL: 45 mg/dL (ref >40)
LDL Cholesterol: 117 mg/dL — ABNORMAL HIGH (ref 0–99)
Total CHOL/HDL Ratio: 3.9 RATIO
Triglycerides: 75 mg/dL (ref <150)
VLDL: 15 mg/dL (ref 0–40)

## 2020-10-17 LAB — HEMOGLOBIN A1C
Hgb A1c MFr Bld: 5.2 % (ref 4.8–5.6)
Mean Plasma Glucose: 102.54 mg/dL

## 2020-10-17 LAB — BRAIN NATRIURETIC PEPTIDE: B Natriuretic Peptide: 32.8 pg/mL (ref 0.0–100.0)

## 2020-10-17 LAB — TSH: TSH: 1.8 u[IU]/mL (ref 0.350–4.500)

## 2020-10-17 MED ORDER — DIPHENHYDRAMINE HCL 50 MG/ML IJ SOLN: 50.0000 mg | Freq: Once | INTRAMUSCULAR | Status: AC

## 2020-10-17 MED ORDER — HALOPERIDOL LACTATE 5 MG/ML IJ SOLN: 10.0000 mg | Freq: Once | INTRAMUSCULAR | Status: AC

## 2020-10-17 MED ORDER — LORAZEPAM 2 MG/ML IJ SOLN: 2.0000 mg | Freq: Once | INTRAMUSCULAR | Status: AC

## 2020-10-17 MED ORDER — HALOPERIDOL 5 MG PO TABS: 10.0000 mg | ORAL_TABLET | Freq: Once | ORAL | Status: AC

## 2020-10-17 MED ORDER — SPIRONOLACTONE 25 MG PO TABS
25.0000 mg | ORAL_TABLET | Freq: Every day | ORAL | Status: DC
  Administered 2020-10-17 – 2020-10-20 (×4): 25 mg via ORAL
  Filled 2020-10-17 (×6): qty 1

## 2020-10-17 MED ORDER — OLANZAPINE 10 MG PO TBDP: 10.0000 mg | ORAL_TABLET | Freq: Three times a day (TID) | ORAL | Status: DC | PRN

## 2020-10-17 MED ORDER — FOLIC ACID 1 MG PO TABS
1.0000 mg | ORAL_TABLET | Freq: Every day | ORAL | Status: DC
  Administered 2020-10-18 – 2020-10-21 (×4): 1 mg via ORAL
  Filled 2020-10-17 (×8): qty 1

## 2020-10-17 MED ORDER — LORAZEPAM 1 MG PO TABS: 1.0000 mg | ORAL_TABLET | Freq: Four times a day (QID) | ORAL | Status: DC | PRN

## 2020-10-17 MED ORDER — GABAPENTIN 300 MG PO CAPS
300.0000 mg | ORAL_CAPSULE | Freq: Three times a day (TID) | ORAL | Status: DC
  Administered 2020-10-17 – 2020-10-21 (×11): 300 mg via ORAL
  Filled 2020-10-17 (×3): qty 1
  Filled 2020-10-17 (×2): qty 21
  Filled 2020-10-17 (×3): qty 1
  Filled 2020-10-17: qty 21
  Filled 2020-10-17 (×2): qty 1
  Filled 2020-10-17 (×2): qty 21
  Filled 2020-10-17 (×5): qty 1
  Filled 2020-10-17: qty 21
  Filled 2020-10-17 (×2): qty 1

## 2020-10-17 MED ORDER — HALOPERIDOL LACTATE 5 MG/ML IJ SOLN
INTRAMUSCULAR | Status: AC
  Administered 2020-10-17: 08:00:00 10 mg via INTRAMUSCULAR
  Filled 2020-10-17: qty 2

## 2020-10-17 MED ORDER — LORAZEPAM 2 MG/ML IJ SOLN
INTRAMUSCULAR | Status: AC
  Administered 2020-10-17: 09:00:00 2 mg via INTRAMUSCULAR
  Filled 2020-10-17: qty 1

## 2020-10-17 MED ORDER — DIPHENHYDRAMINE HCL 50 MG/ML IJ SOLN
INTRAMUSCULAR | Status: AC
  Administered 2020-10-17: 08:00:00 50 mg via INTRAMUSCULAR
  Filled 2020-10-17: qty 1

## 2020-10-17 MED ORDER — IVABRADINE HCL 5 MG PO TABS
5.0000 mg | ORAL_TABLET | Freq: Two times a day (BID) | ORAL | Status: DC
  Administered 2020-10-18 – 2020-10-20 (×5): 5 mg via ORAL
  Filled 2020-10-17 (×10): qty 1

## 2020-10-17 MED ORDER — DIPHENHYDRAMINE HCL 50 MG PO CAPS: 50.0000 mg | ORAL_CAPSULE | Freq: Once | ORAL | Status: AC

## 2020-10-17 MED ORDER — THIAMINE HCL 100 MG PO TABS
100.0000 mg | ORAL_TABLET | Freq: Every day | ORAL | Status: DC
  Administered 2020-10-18 – 2020-10-21 (×4): 100 mg via ORAL
  Filled 2020-10-17 (×8): qty 1

## 2020-10-17 MED ORDER — ZIPRASIDONE MESYLATE 20 MG IM SOLR: 20.0000 mg | Freq: Four times a day (QID) | INTRAMUSCULAR | Status: DC | PRN

## 2020-10-17 MED ORDER — SACUBITRIL-VALSARTAN 49-51 MG PO TABS
1.0000 | ORAL_TABLET | Freq: Two times a day (BID) | ORAL | Status: DC
  Administered 2020-10-17 – 2020-10-21 (×8): 1 via ORAL
  Filled 2020-10-17 (×13): qty 1

## 2020-10-17 NOTE — Tx Team (Signed)
Initial Treatment Plan 10/17/2020 12:45 AM [name] LKT:[id number]    PATIENT STRESSORS: Marital or family conflict Medication change or noncompliance Substance abuse   PATIENT STRENGTHS: Average or above average intelligence Capable of independent living   PATIENT IDENTIFIED PROBLEMS: Depression  Suicidal Ideation  Substance abuse  Psychosis       (patient was selectively mute during admission assessment)         DISCHARGE CRITERIA:  Improved stabilization in mood, thinking, and/or behavior Motivation to continue treatment in a less acute level of care Need for constant or close observation no longer present Verbal commitment to aftercare and medication compliance Withdrawal symptoms are absent or subacute and managed without 24-hour nursing intervention  PRELIMINARY DISCHARGE PLAN: Attend 12-step recovery group Outpatient therapy Return to previous living arrangement  PATIENT/FAMILY INVOLVEMENT: This treatment plan has been presented to and reviewed with the patient, Orian Figueira.  The patient and family have been given the opportunity to ask questions and make suggestions.  Juliann Pares, RN 10/17/2020, 12:45 AM

## 2020-10-17 NOTE — Progress Notes (Signed)
Recreation Therapy Notes  Date: 12.27.21 Time: 0930 Location: [address] Dayroom   Group Topic: Communication, Team Building, Problem Solving  Goal Area(s) Addresses:  Patient will effectively work with peer towards shared goal.  Patient will identify skills used to make activity successful.  Patient will share challenges and verbalize solution-driven approaches used. Patient will identify how skills used during activity can be used to reach post d/c goals.   Intervention: STEM Activity   Activity: [employer]. Patients were provided the following materials: 5 drinking straws, 5 rubber bands, 5 paper clips, 2 index cards and 2 drinking cups. Using the provided materials patients were asked to build a launching mechanism to launch a ping pong ball across the room, approximately 10 feet. Patients were divided into teams of 3-5. Instructions required all materials be incorporated into the device, functionality of items left to the peer group's discretion.  Education: Pharmacist, community, Scientist, physiological, Air cabin crew, Building control surveyor.   Education Outcome: Acknowledges education/In group clarification offered/Needs additional education.   Clinical Observations/Feedback: Pt did not attend group session.    Caroll Rancher, LRT/CTRS         Caroll Rancher A 10/17/2020 11:33 AM

## 2020-10-17 NOTE — Plan of Care (Signed)
°  Problem: Education: °Goal: Knowledge of [facility] Education information/materials will improve °Outcome: Not Progressing °Goal: Emotional status will improve °Outcome: Not Progressing °Goal: Mental status will improve °Outcome: Not Progressing °Goal: Verbalization of understanding the information provided will improve °Outcome: Not Progressing °  °

## 2020-10-17 NOTE — Progress Notes (Signed)
Adult Psychoeducational Group Note  Date:  10/17/2020 Time:  10:15 PM  Group Topic/Focus:  Wrap-Up Group:   The focus of this group is to help patients review their daily goal of treatment and discuss progress on daily workbooks.  Participation Level:  Did Not Attend  Participation Quality:  Did Not Attend  Affect:  Did Not Attend  Cognitive:  Did Not Attend  Insight: None  Engagement in Group:  Did Not Attend  Modes of Intervention:  Did Not Attend  Additional Comments:  Pt did not attend evening wrap up group tonight.  Felipa Furnace 10/17/2020, 10:15 PM

## 2020-10-17 NOTE — Progress Notes (Addendum)
At 0745 Patient came out of room acting bizarre refused medications.  Patient pacing, becoming agitated disrobed and came into hallway, threatening and making bizarre statements unable to follow staff redirection.  Patient continued to escalate staff intiated upper arm shoulder hold patient guided into bedroom while in hold emergency geodon 20 mg Igiven at 0800.  Patient unable to regain control and follow direction of staff.  Patient maintained in upper arm shoulder hold due to combative behaviors and  escorted to seclusion room.   Patient continued with combative behaviors attempting to attack staff. Seclusion initiated at 0805 am.  While in seclusion patient unable to calm began to ram seclusion room door.  Physician ordered 10mg  of haldol 2mg  of ativan and 50mg  of benedryl IM for agitqtion at 810 am.  Staff entered seclusion room placed hands on patient for medication administration.  Medication administered successfully, patient released from hold and seclusion continued.

## 2020-10-17 NOTE — H&P (Signed)
Psychiatric Admission Assessment Adult  Patient Identification: Clifford Clark MRN:  [id number] Date of Evaluation:  10/17/2020 Chief Complaint:  MDD (major depressive disorder), recurrent episode, severe (HCC) [F33.2] Principal Diagnosis: <principal problem not specified> Diagnosis:  Active Problems:   MDD (major depressive disorder), recurrent episode, severe (HCC)  History of Present Illness: Patient is seen and examined.  Patient is a 29 year old male who originally presented to the [facility] emergency department on 10/16/2020 accompanied by his mother.  The patient is acutely psychotic on my examination.  Upon arrival on the hospital this morning he was stripped naked, walking up and down the hallway, loud, abusive and threatening.  Most of his history is collected from previous notes.  He had initially presented to the [facility] behavioral health hospital and then was transferred to the [facility] emergency department for medical clearance.  The patient reported that he had been using methamphetamines.  He appeared to be impaired at that time.  He was disorganized.  He stated at that time that he was desperate to "calm down".  He had been tricked into taking methamphetamines and possibly other substances.  His drug screen on admission was positive for opiates but otherwise negative.  Review of the electronic medical record revealed that he has a history of endocarditis and a history of opiate dependence.  He had been admitted to the medical hospital in July 2021 after having been found down on the side of the road.  He had multiple septic emboli to the lungs with cavitary lesions.  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  There was also emboli found to the kidneys and hands.  He underwent an AngioJet removal of part of the vegetation.  He developed an MRSA sepsis and respiratory failure.  He was intubated at that time and was started on pressors.  His hospital course  at that time was complicated by respiratory failure as well as septic shock.  He completed IV antibiotics prior to discharge.  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  It does appear that he has had his valve replaced.  Unfortunately this morning upon evaluation the patient is currently psychotic.  He appears to be either intoxicated or delirious from his drug use.  He required seclusion, Geodon 20 mg IM x1 which did not resolve his agitation, and then required Haldol 10/Ativan 2/Benadryl 50 to initiate a calming response.  He is currently in seclusion.  Review of the electronic medical record reveals the fact that it does appear that he was last psychiatrically hospitalized to [facility] in March 2020.  It was for opiate dependence at that time.  He had also intermittently used Xanax at that time.  It looks like that was just an emergency room visit, and he was not admitted to the psychiatric unit.  He was discharged on dicyclomine, naloxone and promethazine.  His last vital signs in the chart were from 2300 on 12/26.  His blood pressure was stable.  His heart rate was normal.  He was afebrile.  Pulse oximetry revealed his oxygen level to be 96% on room air.  Review of his electronic medical record laboratories on admission revealed a mildly elevated glucose at 102.  His AST is elevated at 69 and his ALT is elevated at 215.  Unfortunately we do not have any other laboratories in the electronic medical record.  His CBC was essentially normal except for  a mildly elevated platelet count at 402,000.  His differential was normal.  Blood alcohol on admission was less than 10, drug screen was positive for amphetamines.  He is apparently on probation for unspecified reasons.  Associated Signs/Symptoms: Depression Symptoms:  insomnia, anxiety, disturbed sleep, Duration of Depression Symptoms: Greater than two weeks  (Hypo) Manic  Symptoms:  Delusions, Distractibility, Hallucinations, Impulsivity, Irritable Mood, Labiality of Mood, Anxiety Symptoms:  Excessive Worry, Psychotic Symptoms:  Delusions, Hallucinations: Auditory Paranoia, Duration of Psychotic Symptoms: Less than six months  PTSD Symptoms: Negative Total Time spent with patient: 1 hour  Past Psychiatric History: The patient's last hospitalization psychiatrically to our facility was in April 2020.  His diagnosis at that time included opiate dependence, opiate induced mood disorder, opiate withdrawal.  His discharge medications included gabapentin, hydroxyzine, methocarbamol and nicotine.  [facility].  It does not appear that he had any other previous psychiatric admissions since that admission.  Is the patient at risk to self? Yes.    Has the patient been a risk to self in the past 6 months? Yes.    Has the patient been a risk to self within the distant past? Yes.    Is the patient a risk to others? Yes.    Has the patient been a risk to others in the past 6 months? No.  Has the patient been a risk to others within the distant past? No.   Prior Inpatient Therapy:   Prior Outpatient Therapy:    Alcohol Screening: Patient refused Alcohol Screening Tool: Yes Substance Abuse History in the last 12 months:  Yes.   Consequences of Substance Abuse: Medical Consequences:  : Patient had a recent medical hospitalization for treatment for endocarditis of the tricuspid valve most likely secondary to his previous IV drug abuse. Legal Consequences:  Patient is currently on parole but we are unsure for what charges. Family Consequences:  Patient apparently is unable to monitor him, and it does not sound as though he is allowed to return to their home. Withdrawal Symptoms:   Agitation and psychosis Previous Psychotropic Medications: Yes  Psychological Evaluations: Yes  Past Medical History:  Past Medical History:  Diagnosis Date    Asthma    CHF (congestive heart failure) (HCC)    Opiate abuse, continuous (HCC)    Heroin    Past Surgical History:  Procedure Laterality Date   APPLICATION OF ANGIOVAC N/A 05/10/2020   Procedure: APPLICATION OF ANGIOVAC;  Surgeon: Corliss SkainsLightfoot, Harrell O, MD;  Location: [facility] OR;  Service: Vascular;  Laterality: N/A;   BUBBLE STUDY  05/06/2020   Procedure: BUBBLE STUDY;  Surgeon: Chrystie NoseHilty, Kenneth C, MD;  [facility];  Service: Cardiovascular;;   I & D EXTREMITY Bilateral 05/03/2020   Procedure: BILATERAL IRRIGATION AND DEBRIDEMENT HANDS;  Surgeon: Sheral ApleyMurphy, Timothy D, MD;  Location: [facility] ORS;  Service: Orthopedics;  Laterality: Bilateral;   MULTIPLE EXTRACTIONS WITH ALVEOLOPLASTY N/A 06/14/2020   Procedure: Surgical extraction of tooth #'s 3 and 28 with alveoloplasty and gross debridement of remaining dentition.;  Surgeon: Charlynne PanderKulinski, Ronald F, DDS;  Location: [facility] OR;  Service: Oral Surgery;  Laterality: N/A;   TEE WITHOUT CARDIOVERSION N/A 05/06/2020   Procedure: TRANSESOPHAGEAL ECHOCARDIOGRAM (TEE);  Surgeon: Chrystie NoseHilty, Kenneth C, MD;  [facility];  Service: Cardiovascular;  Laterality: N/A;   TEE WITHOUT CARDIOVERSION N/A 05/10/2020   Procedure: TRANSESOPHAGEAL ECHOCARDIOGRAM (TEE);  Surgeon: Corliss SkainsLightfoot, Harrell O, MD;  [facility];  Service: Thoracic;  Laterality: N/A;  Family History: History reviewed. No pertinent family history. Family Psychiatric  History: Unknown Tobacco Screening: Have you used any form of tobacco in the last 30 days? (Cigarettes, Smokeless Tobacco, Cigars, and/or Pipes): Patient Refused Screening Social History:  Social History   Substance and Sexual Activity  Alcohol Use Not Currently   Comment: rarely     Social History   Substance and Sexual Activity  Drug Use Not Currently   Types: Marijuana, IV, "Crack" cocaine, Cocaine, Fentanyl, Methamphetamines, Amphetamines   Comment: Heroin    Additional Social History:                            Allergies:  No Known Allergies Lab Results:  Results for orders placed or performed during the hospital encounter of 10/16/20 (from the past 48 hour(s))  Resp Panel by RT-PCR (Flu A&B, Covid) Nasopharyngeal Swab     Status: None   Collection Time: 10/16/20  5:47 PM   Specimen: Nasopharyngeal Swab; Nasopharyngeal(NP) swabs in vial transport medium  Result Value Ref Range   SARS Coronavirus 2 by RT PCR NEGATIVE NEGATIVE    Comment: (NOTE) SARS-CoV-2 target nucleic acids are NOT DETECTED.  The SARS-CoV-2 RNA is generally detectable in upper respiratory specimens during the acute phase of infection. The lowest concentration of SARS-CoV-2 viral copies this assay can detect is 138 copies/mL. A negative result does not preclude SARS-Cov-2 infection and should not be used as the sole basis for treatment or other patient management decisions. A negative result may occur with  improper specimen collection/handling, submission of specimen other than nasopharyngeal swab, presence of viral mutation(s) within the areas targeted by this assay, and inadequate number of viral copies(<138 copies/mL). A negative result must be combined with clinical observations, patient history, and epidemiological information. The expected result is Negative.  Fact Sheet for Patients:  BloggerCourse.com  Fact Sheet for Healthcare Providers:  SeriousBroker.it  This test is no t yet approved or cleared by the [place] FDA and  has been authorized for detection and/or diagnosis of SARS-CoV-2 by FDA under an Emergency Use Authorization (EUA). This EUA will remain  in effect (meaning this test can be used) for the duration of the COVID-19 declaration under Section 564(b)(1) of the Act, 21 U.S.C.section 360bbb-3(b)(1), unless the authorization is terminated  or revoked sooner.       Influenza A by PCR NEGATIVE NEGATIVE   Influenza B by PCR NEGATIVE  NEGATIVE    Comment: (NOTE) The Xpert Xpress SARS-CoV-2/FLU/RSV plus assay is intended as an aid in the diagnosis of influenza from Nasopharyngeal swab specimens and should not be used as a sole basis for treatment. Nasal washings and aspirates are unacceptable for Xpert Xpress SARS-CoV-2/FLU/RSV testing.  Fact Sheet for Patients: BloggerCourse.com  Fact Sheet for Healthcare Providers: SeriousBroker.it  This test is not yet approved or cleared by the [place] FDA and has been authorized for detection and/or diagnosis of SARS-CoV-2 by FDA under an Emergency Use Authorization (EUA). This EUA will remain in effect (meaning this test can be used) for the duration of the COVID-19 declaration under Section 564(b)(1) of the Act, 21 U.S.C. section 360bbb-3(b)(1), unless the authorization is terminated or revoked.  Performed at [facility], [address] [place], [place] [address]   Comprehensive metabolic panel     Status: Abnormal   Collection Time: 10/16/20  5:47 PM  Result Value Ref Range   Sodium 136 135 - 145 mmol/L  Potassium 4.0 3.5 - 5.1 mmol/L   Chloride 103 98 - 111 mmol/L   CO2 25 22 - 32 mmol/L   Glucose, Bld 102 (H) 70 - 99 mg/dL    Comment: Glucose reference range applies only to samples taken after fasting for at least 8 hours.   BUN 23 (H) 6 - 20 mg/dL   Creatinine, Ser 1.54 0.61 - 1.24 mg/dL   Calcium 9.8 8.9 - 00.8 mg/dL   Total Protein 8.0 6.5 - 8.1 g/dL   Albumin 4.7 3.5 - 5.0 g/dL   AST 69 (H) 15 - 41 U/L   ALT 215 (H) 0 - 44 U/L   Alkaline Phosphatase 73 38 - 126 U/L   Total Bilirubin 0.9 0.3 - 1.2 mg/dL   GFR, Estimated >67 >61 mL/min    Comment: (NOTE) Calculated using the CKD-EPI Creatinine Equation (2021)    Anion gap 8 5 - 15    Comment: Performed at [facility], [address] [place], [place] [address]  Ethanol     Status: None   Collection Time:  10/16/20  5:47 PM  Result Value Ref Range   Alcohol, Ethyl (B) <10 <10 mg/dL    Comment: (NOTE) Lowest detectable limit for serum alcohol is 10 mg/dL.  For medical purposes only. Performed at [facility], [address] [place], [place] [address]   Urine rapid drug screen (hosp performed)     Status: Abnormal   Collection Time: 10/16/20  5:47 PM  Result Value Ref Range   Opiates NONE DETECTED NONE DETECTED   Cocaine NONE DETECTED NONE DETECTED   Benzodiazepines NONE DETECTED NONE DETECTED   Amphetamines POSITIVE (A) NONE DETECTED   Tetrahydrocannabinol NONE DETECTED NONE DETECTED   Barbiturates NONE DETECTED NONE DETECTED    Comment: (NOTE) DRUG SCREEN FOR MEDICAL PURPOSES ONLY.  IF CONFIRMATION IS NEEDED FOR ANY PURPOSE, NOTIFY LAB WITHIN 5 DAYS.  LOWEST DETECTABLE LIMITS FOR URINE DRUG SCREEN Drug Class                     Cutoff (ng/mL) Amphetamine and metabolites    1000 Barbiturate and metabolites    200 Benzodiazepine                 200 Tricyclics and metabolites     300 Opiates and metabolites        300 Cocaine and metabolites        300 THC                            50 Performed at [facility], [address] [place], [place] [address]   CBC with Diff     Status: Abnormal   Collection Time: 10/16/20  5:47 PM  Result Value Ref Range   WBC 6.8 4.0 - 10.5 K/uL   RBC 5.42 4.22 - 5.81 MIL/uL   Hemoglobin 15.6 13.0 - 17.0 g/dL   HCT 98.3 38.2 - 50.5 %   MCV 84.3 80.0 - 100.0 fL   MCH 28.8 26.0 - 34.0 pg   MCHC 34.1 30.0 - 36.0 g/dL   RDW 39.7 67.3 - 41.9 %   Platelets 402 (H) 150 - 400 K/uL   nRBC 0.0 0.0 - 0.2 %   Neutrophils Relative % 50 %   Neutro Abs 3.3 1.7 - 7.7 K/uL   Lymphocytes Relative 35 %   Lymphs Abs 2.4 0.7 -  4.0 K/uL   Monocytes Relative 11 %   Monocytes Absolute 0.8 0.1 - 1.0 K/uL   Eosinophils Relative 3 %   Eosinophils Absolute 0.2 0.0 - 0.5 K/uL   Basophils Relative 1 %   Basophils Absolute  0.1 0.0 - 0.1 K/uL   Immature Granulocytes 0 %   Abs Immature Granulocytes 0.02 0.00 - 0.07 K/uL    Comment: Performed at [facility], [address], [place], [place] [address]    Blood Alcohol level:  Lab Results  Component Value Date   [facility] <10 10/16/2020   ETH <10 04/28/2020    Metabolic Disorder Labs:  Lab Results  Component Value Date   HGBA1C 4.5 07/04/2020   No results found for: PROLACTIN Lab Results  Component Value Date   CHOL 160 05/06/2019   TRIG 170 (H) 05/18/2020   HDL 51 05/06/2019   CHOLHDL 3.1 05/06/2019   VLDL 14 01/26/2019   LDLCALC 88 05/06/2019   LDLCALC 143 (H) 01/26/2019    Current Medications: Current Facility-Administered Medications  Medication Dose Route Frequency Provider Last Rate Last Admin   acetaminophen (TYLENOL) tablet 650 mg  650 mg Oral Q6H PRN Nwoko, Uchenna E, PA       alum & mag hydroxide-simeth (MAALOX/MYLANTA) 200-200-20 MG/5ML suspension 30 mL  30 mL Oral Q4H PRN Nwoko, Uchenna E, PA       folic acid (FOLVITE) tablet 1 mg  1 mg Oral Daily Jola Babinski, Marlane Mingle, MD       gabapentin (NEURONTIN) capsule 300 mg  300 mg Oral TID Antonieta Pert, MD       hydrOXYzine (ATARAX/VISTARIL) tablet 25 mg  25 mg Oral TID PRN Nwoko, Uchenna E, PA       ivabradine (CORLANOR) tablet 5 mg  5 mg Oral BID WC Antonieta Pert, MD       LORazepam (ATIVAN) tablet 1 mg  1 mg Oral PRN Nwoko, Uchenna E, PA       LORazepam (ATIVAN) tablet 1 mg  1 mg Oral Q6H PRN Antonieta Pert, MD       OLANZapine zydis (ZYPREXA) disintegrating tablet 10 mg  10 mg Oral Q8H PRN Antonieta Pert, MD       And   LORazepam (ATIVAN) tablet 1 mg  1 mg Oral Q6H PRN Antonieta Pert, MD       And   ziprasidone (GEODON) injection 20 mg  20 mg Intramuscular Q6H PRN Antonieta Pert, MD       magnesium hydroxide (MILK OF MAGNESIA) suspension 30 mL  30 mL Oral Daily PRN Nwoko, Uchenna E, PA       sacubitril-valsartan (ENTRESTO) 49-51 mg  per tablet  1 tablet Oral BID Antonieta Pert, MD       spironolactone (ALDACTONE) tablet 25 mg  25 mg Oral Daily Antonieta Pert, MD       thiamine tablet 100 mg  100 mg Oral Daily Antonieta Pert, MD       traZODone (DESYREL) tablet 50 mg  50 mg Oral QHS PRN Nwoko, Uchenna E, PA       PTA Medications: Medications Prior to Admission  Medication Sig Dispense Refill Last Dose   amoxicillin (AMOXIL) 500 MG capsule Take four capsules one hour before dental appointment. (Patient not taking: Reported on 10/16/2020) 4 capsule 3    carvedilol (COREG) 6.25 MG tablet Take 1 tablet (6.25 mg total) by mouth 2 (two) times daily with a meal.  60 tablet 3    clonazePAM (KLONOPIN) 0.5 MG tablet Take 0.5 mg by mouth daily.      gabapentin (NEURONTIN) 600 MG tablet Take 600 mg by mouth 3 (three) times daily.      ivabradine (CORLANOR) 5 MG TABS tablet Take 1 tablet (5 mg total) by mouth 2 (two) times daily with a meal. 120 tablet 5    sacubitril-valsartan (ENTRESTO) 49-51 MG Take 1 tablet by mouth 2 (two) times daily. 180 tablet 3    sertraline (ZOLOFT) 50 MG tablet Take 50 mg by mouth daily.      spironolactone (ALDACTONE) 25 MG tablet Take 1 tablet (25 mg total) by mouth daily. 30 tablet 3     Musculoskeletal: Strength & Muscle Tone: within normal limits Gait & Station: normal Patient leans: N/A  Psychiatric Specialty Exam: Physical Exam Vitals and nursing note reviewed.  HENT:     Head: Normocephalic and atraumatic.  Pulmonary:     Effort: Pulmonary effort is normal.  Neurological:     General: No focal deficit present.     Mental Status: He is alert.     Review of Systems  Resp. rate 15, height 5\' 7"  (1.702 m).Body mass index is 24.75 kg/m.  General Appearance: Bizarre  Eye Contact:  Good  Speech:  Pressured  Volume:  Increased  Mood:  Angry, Dysphoric and Irritable  Affect:  Labile  Thought Process:  Disorganized and Descriptions of Associations: Loose   Orientation:  Negative  Thought Content:  Illogical, Delusions, Hallucinations: Auditory, Paranoid Ideation, Rumination and Tangential  Suicidal Thoughts:  No  Homicidal Thoughts:  No  Memory:  Immediate;   Poor Recent;   Poor Remote;   Poor  Judgement:  Impaired  Insight:  Lacking  Psychomotor Activity:  Increased  Concentration:  Concentration: Poor  Recall:  Poor  Fund of Knowledge:  Poor  Language:  Poor  Akathisia:  Negative  Handed:  Right  AIMS (if indicated):     Assets:  Desire for Improvement Resilience  ADL's:  Impaired  Cognition:  Impaired,  Moderate  Sleep:       Treatment Plan Summary: Daily contact with patient to assess and evaluate symptoms and progress in treatment, Medication management and Plan : Patient is seen and examined.  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  He will be admitted to the hospital.  He will be integrated in the milieu.  He will be encouraged to attend groups.  From review of the electronic medical record it appears that the patient is on carvedilol 6.25 mg p.o. twice daily, furosemide 40 mg p.o. daily, gabapentin 600 mg p.o. 3 times daily, Corlanor 5 mg p.o. twice daily, Entresto, sertraline, spironolactone.  Unfortunately it does not appear as though any of those medicines were written on the as admit orders.  We will go on and restart most of these given his heart failure and history of endocarditis and valve replacement.  We will check vital signs every 4 hours while he is in seclusion.  I will also put in place and agitation protocol, as well as lorazepam 1 mg p.o. every 6 hours as needed a CIWA greater than 10.  I am concerned about possibility of benzodiazepine withdrawal as well as alcohol withdrawal given his liver function enzymes.  Review of the PMP database revealed a prescription for  benzodiazepines since October of this year.  He has been on clonazepam 0.5 mg p.o. daily.  As stated above the CIWA will be in place.  We will contact the mother for collateral information, and trying to get the patient more stabilized.  Observation Level/Precautions:  15 minute checks  Laboratory:  Chemistry Profile  Psychotherapy:    Medications:    Consultations:    Discharge Concerns:    Estimated LOS:  Other:     Physician Treatment Plan for Primary Diagnosis: <principal problem not specified> Long Term Goal(s): Improvement in symptoms so as ready for discharge  Short Term Goals: Ability to identify changes in lifestyle to reduce recurrence of condition will improve, Ability to verbalize feelings will improve, Ability to demonstrate self-control will improve, Ability to identify and develop effective coping behaviors will improve, Ability to maintain clinical measurements within normal limits will improve and Ability to identify triggers associated with substance abuse/mental health issues will improve  Physician Treatment Plan for Secondary Diagnosis: Active Problems:   MDD (major depressive disorder), recurrent episode, severe (HCC)  Long Term Goal(s): Improvement in symptoms so as ready for discharge  Short Term Goals: Ability to identify changes in lifestyle to reduce recurrence of condition will improve, Ability to verbalize feelings will improve, Ability to demonstrate self-control will improve, Ability to identify and develop effective coping behaviors will improve, Ability to maintain clinical measurements within normal limits will improve and Ability to identify triggers associated with substance abuse/mental health issues will improve  I certify that inpatient services furnished can reasonably be expected to improve the patient's condition.    Antonieta Pert, MD 12/27/202112:01 PM

## 2020-10-17 NOTE — Progress Notes (Signed)
Admission note:  Pt is a 29 year old Caucasian male admitted to the services of Dr. Jola Babinski of depression, suicidal Ideation, psychosis, and substance abuse.  Pt was not cooperative with the admission process and would only keep saying that he loved his mother.  Pt refused to sign any of the paperwork and would not open his eyes.  He refused VS as well.  Pt did agree to come on to the unit and agreed to stay the night and see the doctor.  He appeared somewhat tearful in his room and again repeated that he loved his mother and would not harm himself.  It was unclear if he was experiencing hallucinations, patient would not answer any such questions.  Pt presently in room and appears to be sleeping.  Will continue to monitor.  No roommate order requested and received due to bizarre behavior and paranoia.

## 2020-10-17 NOTE — [facility] Group Notes (Signed)
[facility] LCSW Group Therapy  10/17/2020 2:00 PM  Type of Therapy:  Group Therapy  Participation Level:  Did Not Attend  Summary of Progress/Problems: Patient unable to attend group due to being in seclusion at time of group.   Bolton Canupp A Cuthbert Turton 10/17/2020, 2:00 PM

## 2020-10-17 NOTE — BHH Suicide Risk Assessment (Signed)
[facility] Admission Suicide Risk Assessment   Nursing information obtained from:  Review of record Demographic factors:  Male,Adolescent or young adult,Caucasian Current Mental Status:  Suicidal ideation indicated by others,Suicide plan,Self-harm thoughts Loss Factors:  NA Historical Factors:  Family history of mental illness or substance abuse Risk Reduction Factors:  Sense of responsibility to family  Total Time spent with patient: 45 minutes Principal Problem: <principal problem not specified> Diagnosis:  Active Problems:   MDD (major depressive disorder), recurrent episode, severe (HCC)  Subjective Data: Patient is seen and examined.  Patient is a 29 year old male who originally presented to the [facility] emergency department on 10/16/2020 accompanied by his mother.  The patient is acutely psychotic on my examination.  Upon arrival on the hospital this morning he was stripped naked, walking up and down the hallway, loud, abusive and threatening.  Most of his history is collected from previous notes.  He had initially presented to the [facility] behavioral health hospital and then was transferred to the [facility] emergency department for medical clearance.  The patient reported that he had been using methamphetamines.  He appeared to be impaired at that time.  He was disorganized.  He stated at that time that he was desperate to "calm down".  He had been tricked into taking methamphetamines and possibly other substances.  His drug screen on admission was positive for opiates but otherwise negative.  Review of the electronic medical record revealed that he has a history of endocarditis and a history of opiate dependence.  He had been admitted to the medical hospital in July 2021 after having been found down on the side of the road.  He had multiple septic emboli to the lungs with cavitary lesions.  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  There was also emboli found to  the kidneys and hands.  He underwent an AngioJet removal of part of the vegetation.  He developed an MRSA sepsis and respiratory failure.  He was intubated at that time and was started on pressors.  His hospital course at that time was complicated by respiratory failure as well as septic shock.  He completed IV antibiotics prior to discharge.  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  It does appear that he has had his valve replaced.  Unfortunately this morning upon evaluation the patient is currently psychotic.  He appears to be either intoxicated or delirious from his drug use.  He required seclusion, Geodon 20 mg IM x1 which did not resolve his agitation, and then required Haldol 10/Ativan 2/Benadryl 50 to initiate a calming response.  He is currently in seclusion.  Review of the electronic medical record reveals the fact that it does appear that he was last psychiatrically hospitalized to [facility] in March 2020.  It was for opiate dependence at that time.  He had also intermittently used Xanax at that time.  It looks like that was just an emergency room visit, and he was not admitted to the psychiatric unit.  He was discharged on dicyclomine, naloxone and promethazine.  His last vital signs in the chart were from 2300 on 12/26.  His blood pressure was stable.  His heart rate was normal.  He was afebrile.  Pulse oximetry revealed his oxygen level to be 96% on room air.  Review of his electronic medical record laboratories on admission revealed a mildly elevated glucose at 102.  His  AST is elevated at 69 and his ALT is elevated at 215.  Unfortunately we do not have any other laboratories in the electronic medical record.  His CBC was essentially normal except for a mildly elevated platelet count at 402,000.  His differential was normal.  Blood alcohol on admission was less than 10, drug screen was positive for amphetamines.  He is  apparently on probation for unspecified reasons.  Continued Clinical Symptoms:    The "Alcohol Use Disorders Identification Test", Guidelines for Use in Primary Care, Second Edition.  World Science writer [facility]). Score between 0-7:  no or low risk or alcohol related problems. Score between 8-15:  moderate risk of alcohol related problems. Score between 16-19:  high risk of alcohol related problems. Score 20 or above:  warrants further diagnostic evaluation for alcohol dependence and treatment.   CLINICAL FACTORS:   Alcohol/Substance Abuse/Dependencies Currently Psychotic   Musculoskeletal: Strength & Muscle Tone: within normal limits Gait & Station: normal Patient leans: N/A  Psychiatric Specialty Exam: Physical Exam Vitals and nursing note reviewed.  HENT:     Head: Normocephalic and atraumatic.  Pulmonary:     Effort: Pulmonary effort is normal.  Neurological:     General: No focal deficit present.     Mental Status: He is alert. He is disoriented.     Review of Systems  Height 5\' 7"  (1.702 m).Body mass index is 24.75 kg/m.  General Appearance: Disheveled  Eye Contact:  Good  Speech:  Pressured  Volume:  Increased  Mood:  Angry and Dysphoric  Affect:  Labile  Thought Process:  Disorganized and Descriptions of Associations: Loose  Orientation:  Negative  Thought Content:  Delusions, Hallucinations: Auditory, Paranoid Ideation and Rumination  Suicidal Thoughts:  No  Homicidal Thoughts:  No  Memory:  Immediate;   Poor Recent;   Poor Remote;   Poor  Judgement:  Poor  Insight:  Lacking  Psychomotor Activity:  Increased  Concentration:  Concentration: Poor and Attention Span: Poor  Recall:  Poor  Fund of Knowledge:  Poor  Language:  Good  Akathisia:  Negative  Handed:  Right  AIMS (if indicated):     Assets:  Resilience  ADL's:  Impaired  Cognition:  Impaired,  Moderate  Sleep:         COGNITIVE FEATURES THAT CONTRIBUTE TO RISK:  Thought  constriction (tunnel vision)    SUICIDE RISK:   Mild:  Suicidal ideation of limited frequency, intensity, duration, and specificity.  There are no identifiable plans, no associated intent, mild dysphoria and related symptoms, good self-control (both objective and subjective assessment), few other risk factors, and identifiable protective factors, including available and accessible social support.  PLAN OF CARE: Patient is seen and examined.  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  He will be admitted to the hospital.  He will be integrated in the milieu.  He will be encouraged to attend groups.  From review of the electronic medical record it appears that the patient is on carvedilol 6.25 mg p.o. twice daily, furosemide 40 mg p.o. daily, gabapentin 600 mg p.o. 3 times daily, Corlanor 5 mg p.o. twice daily, Entresto, sertraline, spironolactone.  Unfortunately it does not appear as though any of those medicines were written on the as admit orders.  We will go on and restart most of these given his heart failure and history of endocarditis and valve  replacement.  We will check vital signs every 4 hours while he is in seclusion.  I will also put in place and agitation protocol, as well as lorazepam 1 mg p.o. every 6 hours as needed a CIWA greater than 10.  I am concerned about possibility of benzodiazepine withdrawal as well as alcohol withdrawal given his liver function enzymes.  Review of the PMP database revealed a prescription for benzodiazepines since October of this year.  He has been on clonazepam 0.5 mg p.o. daily.  As stated above the CIWA will be in place.  We will contact the mother for collateral information, and trying to get the patient more stabilized.  I certify that inpatient services furnished can reasonably be expected to improve the patient's  condition.   Antonieta Pert, MD 10/17/2020, 8:56 AM

## 2020-10-17 NOTE — Progress Notes (Signed)
   10/17/20 2300  Psych Admission Type (Psych Patients Only)  Admission Status Voluntary  Psychosocial Assessment  Patient Complaints Anxiety  Eye Contact Intense;Watchful  Facial Expression Animated;Anxious;Pensive;Wide-eyed;Worried  Affect Angry;Anxious;Apprehensive;Irritable;Labile;Preoccupied  Speech Aggressive;Argumentative;Logical/coherent;Rapid;Pressured;Elective mutism;Tangential;Rhyming  Interaction Forwards little;Guarded;Hostile  Motor Activity Fidgety;Pacing;Restless  Appearance/Hygiene Other (Comment) (disrobing)  Behavior Characteristics Unwilling to participate  Mood Labile  Thought Process  Coherency Disorganized;Flight of ideas;Tangential  Content Delusions;Paranoia  Delusions Paranoid  Perception Derealization  Hallucination UTA  Judgment Impaired  Confusion Severe  Danger to Self  Current suicidal ideation? Denies  Danger to Others  Danger to Others None reported or observed

## 2020-10-17 NOTE — Tx Team (Signed)
Interdisciplinary Treatment and Diagnostic Plan Update  10/17/2020 Time of Session: 9:35am Clifford Clark MRN: [id number]  Principal Diagnosis: <principal problem not specified>  Secondary Diagnoses: Active Problems:   MDD (major depressive disorder), recurrent episode, severe (HCC)   Current Medications:  Current Facility-Administered Medications  Medication Dose Route Frequency Provider Last Rate Last Admin  . acetaminophen (TYLENOL) tablet 650 mg  650 mg Oral Q6H PRN Nwoko, Uchenna E, PA      . alum & mag hydroxide-simeth (MAALOX/MYLANTA) 200-200-20 MG/5ML suspension 30 mL  30 mL Oral Q4H PRN Nwoko, Uchenna E, PA      . folic acid (FOLVITE) tablet 1 mg  1 mg Oral Daily Sharma Covert, MD      . gabapentin (NEURONTIN) capsule 300 mg  300 mg Oral TID Sharma Covert, MD      . hydrOXYzine (ATARAX/VISTARIL) tablet 25 mg  25 mg Oral TID PRN Nwoko, Uchenna E, PA      . ivabradine (CORLANOR) tablet 5 mg  5 mg Oral BID WC Sharma Covert, MD      . LORazepam (ATIVAN) tablet 1 mg  1 mg Oral PRN Nwoko, Uchenna E, PA      . LORazepam (ATIVAN) tablet 1 mg  1 mg Oral Q6H PRN Sharma Covert, MD      . OLANZapine zydis [facility]) disintegrating tablet 10 mg  10 mg Oral Q8H PRN Sharma Covert, MD       And  . LORazepam (ATIVAN) tablet 1 mg  1 mg Oral Q6H PRN Sharma Covert, MD       And  . ziprasidone (GEODON) injection 20 mg  20 mg Intramuscular Q6H PRN Sharma Covert, MD      . magnesium hydroxide (MILK OF MAGNESIA) suspension 30 mL  30 mL Oral Daily PRN Nwoko, Uchenna E, PA      . sacubitril-valsartan (ENTRESTO) 49-51 mg per tablet  1 tablet Oral BID Sharma Covert, MD      . spironolactone (ALDACTONE) tablet 25 mg  25 mg Oral Daily Sharma Covert, MD      . thiamine tablet 100 mg  100 mg Oral Daily Sharma Covert, MD      . traZODone (DESYREL) tablet 50 mg  50 mg Oral QHS PRN Nwoko, Uchenna E, PA       PTA Medications: Medications Prior to Admission   Medication Sig Dispense Refill Last Dose  . amoxicillin (AMOXIL) 500 MG capsule Take four capsules one hour before dental appointment. (Patient not taking: Reported on 10/16/2020) 4 capsule 3   . carvedilol (COREG) 6.25 MG tablet Take 1 tablet (6.25 mg total) by mouth 2 (two) times daily with a meal. 60 tablet 3   . clonazePAM (KLONOPIN) 0.5 MG tablet Take 0.5 mg by mouth daily.     Marland Kitchen gabapentin (NEURONTIN) 600 MG tablet Take 600 mg by mouth 3 (three) times daily.     . ivabradine (CORLANOR) 5 MG TABS tablet Take 1 tablet (5 mg total) by mouth 2 (two) times daily with a meal. 120 tablet 5   . sacubitril-valsartan (ENTRESTO) 49-51 MG Take 1 tablet by mouth 2 (two) times daily. 180 tablet 3   . sertraline (ZOLOFT) 50 MG tablet Take 50 mg by mouth daily.     Marland Kitchen spironolactone (ALDACTONE) 25 MG tablet Take 1 tablet (25 mg total) by mouth daily. 30 tablet 3     Patient Stressors: Marital or family conflict Medication change or noncompliance  Substance abuse  Patient Strengths: Average or above average intelligence Capable of independent living  Treatment Modalities: Medication Management, Group therapy, Case management,  1 to 1 session with clinician, Psychoeducation, Recreational therapy.   Physician Treatment Plan for Primary Diagnosis: <principal problem not specified> Long Term Goal(s):     Short Term Goals:    Medication Management: Evaluate patient's response, side effects, and tolerance of medication regimen.  Therapeutic Interventions: 1 to 1 sessions, Unit Group sessions and Medication administration.  Evaluation of Outcomes: Not Met  Physician Treatment Plan for Secondary Diagnosis: Active Problems:   MDD (major depressive disorder), recurrent episode, severe ([place])  Long Term Goal(s):     Short Term Goals:       Medication Management: Evaluate patient's response, side effects, and tolerance of medication regimen.  Therapeutic Interventions: 1 to 1 sessions, Unit Group  sessions and Medication administration.  Evaluation of Outcomes: Not Met   RN Treatment Plan for Primary Diagnosis: <principal problem not specified> Long Term Goal(s): Knowledge of disease and therapeutic regimen to maintain health will improve  Short Term Goals: Ability to remain free from injury will improve, Ability to verbalize frustration and anger appropriately will improve, Ability to demonstrate self-control, Ability to participate in decision making will improve, Ability to identify and develop effective coping behaviors will improve and Compliance with prescribed medications will improve  Medication Management: RN will administer medications as ordered by provider, will assess and evaluate patient's response and provide education to patient for prescribed medication. RN will report any adverse and/or side effects to prescribing provider.  Therapeutic Interventions: 1 on 1 counseling sessions, Psychoeducation, Medication administration, Evaluate responses to treatment, Monitor vital signs and CBGs as ordered, Perform/monitor CIWA, COWS, AIMS and Fall Risk screenings as ordered, Perform wound care treatments as ordered.  Evaluation of Outcomes: Not Met   LCSW Treatment Plan for Primary Diagnosis: <principal problem not specified> Long Term Goal(s): Safe transition to appropriate next level of care at discharge, Engage patient in therapeutic group addressing interpersonal concerns.  Short Term Goals: Engage patient in aftercare planning with referrals and resources, Increase social support, Increase ability to appropriately verbalize feelings, Identify triggers associated with mental health/substance abuse issues and Increase skills for wellness and recovery  Therapeutic Interventions: Assess for all discharge needs, 1 to 1 time with Social worker, Explore available resources and support systems, Assess for adequacy in community support network, Educate family and significant other(s)  on suicide prevention, Complete Psychosocial Assessment, Interpersonal group therapy.  Evaluation of Outcomes: Not Met   Progress in Treatment: Attending groups: No. Participating in groups: No. Taking medication as prescribed: No. Toleration medication: Yes. Family/Significant other contact made: No, will contact:  if consent is provided  Patient understands diagnosis: No. Discussing patient identified problems/goals with staff: No. Medical problems stabilized or resolved: Yes. Denies suicidal/homicidal ideation: Yes. Issues/concerns per patient self-inventory: No.  New problem(s) identified: No, Describe:  none  New Short Term/Long Term Goal(s):medication stabilization, elimination of SI thoughts, development of comprehensive mental wellness plan.   Patient Goals:  Pt did not attend.  Discharge Plan or Barriers: Patient recently admitted. CSW will continue to follow and assess for appropriate referrals and possible discharge planning.   Reason for Continuation of Hospitalization: Delusions  Mania Medication stabilization Withdrawal symptoms  Estimated Length of Stay: 3-5 days  Attendees: Patient: Did not attend 10/17/2020   Physician:  10/17/2020   Nursing:  10/17/2020   RN Care Manager: 10/17/2020   Social Worker: Darletta Moll, LCSW 10/17/2020  Recreational Therapist:  10/17/2020   Other:  10/17/2020  Other:  10/17/2020   Other: 10/17/2020    Scribe for Treatment Team: Vassie Moselle, LCSW 10/17/2020 9:53 AM

## 2020-10-18 MED ORDER — OLANZAPINE 10 MG PO TBDP
10.0000 mg | ORAL_TABLET | Freq: Every day | ORAL | Status: DC
  Administered 2020-10-19: 21:00:00 10 mg via ORAL
  Filled 2020-10-18 (×4): qty 1

## 2020-10-18 NOTE — Progress Notes (Signed)
Pt asleep in bed on initial approach. Out of bed at noon and complaint with medications this shift when offered. Denies SI, HI, AVH and pain when assessed. Presents guarded with flat affect, [place], minimal / superficial on interactions. Speech is logical and soft. Isolative to his room most of this shift.  Attended noon group with pharmacist when prompted. All medications given as ordered. Support and reassurance provided to pt as needed. Pt encouraged to voice concerns. Q 15 minutes safety checks maintained on unit without disrobing or outburst thus far.   Pt tolerates medications and meals well without discomfort. Pt denies concerns at this time.

## 2020-10-18 NOTE — BHH Group Notes (Signed)
Adult Psychoeducational Group Note  Date:  10/18/2020 Time:  11:17 AM  Group Topic/Focus:  Goals Group:   The focus of this group is to help patients establish daily goals to achieve during treatment and discuss how the patient can incorporate goal setting into their daily lives to aide in recovery.  Participation Level:  Did Not Attend  Deforest H[facility]les [facility] 10/18/2020, 11:17 AM

## 2020-10-18 NOTE — BHH Counselor (Signed)
CSW attempted to complete PSA with this patient, however patient was still sleeping and requested to complete PSA the following day.    Ruthann Cancer MSW, LCSW Clincal Social Worker  [facility]

## 2020-10-18 NOTE — Progress Notes (Signed)
Adult Psychoeducational Group Note  Date:  10/18/2020 Time:  9:58 PM  Group Topic/Focus:  Wrap-Up Group:   The focus of this group is to help patients review their daily goal of treatment and discuss progress on daily workbooks.  Participation Level:  Did Not Attend  Participation Quality:  Did Not Attend  Affect:  Did Not Attend  Cognitive:  Did Not Attend  Insight: None  Engagement in Group:  Did Not Attend  Modes of Intervention:  Did Not Attend  Additional Comments:  Pt did not attend evening wrap up group tonight.  Felipa Furnace 10/18/2020, 9:58 PM

## 2020-10-18 NOTE — Progress Notes (Addendum)
[facility] MD Progress Note  10/18/2020 11:01 AM Clifford Clark  MRN:  [id number] Subjective: Patient is a 29 year old male with a past psychiatric history significant for polysubstance dependence who presented to the [facility] emergency department on 10/16/2020 acutely psychotic.  Objective: Patient is seen and examined.  Patient is a 29 year old male with the above-stated past psychiatric history is seen in follow-up.  He is much clear today.  He slept throughout the night.  He is alert and oriented x3.  He denied any auditory or visual hallucinations.  He admitted to relapsing on substances approximately 3 weeks ago.  He stated he had been smoking the amphetamines as well as injecting them.  Currently he stated he just wants to get some sleep.  He stated he had not slept well over the last several weeks.  He denied any auditory or visual hallucinations.  He denied any suicidal or homicidal ideation.  He did state that he had been taking all of his heart failure medications as well as sertraline and gabapentin prior to admission.  He stated that his last rehabilitation stay was approximately 2 months before he was admitted for his endocarditis.  He denied any chest pain or shortness of breath.  Review of his laboratories again revealed the elevated liver function enzymes from 12/26.  His AST was 69 and his ALT was 215.  We will repeat those.  His BNP from yesterday was 32.8.  Lipid panel was normal.  His TSH was 1.800 drug screen was positive for amphetamines only.  His EKG showed an incomplete right bundle branch block with a QTc interval of 450.  His vital signs are stable, he is afebrile.  Sleep was not recorded, but nursing stated he slept throughout the night.  Principal Problem: <principal problem not specified> Diagnosis: Active Problems:   MDD (major depressive disorder), recurrent episode, severe (HCC)  Total Time spent with patient: 20 minutes  Past Psychiatric History: See  admission H&P  Past Medical History:  Past Medical History:  Diagnosis Date  . Asthma   . CHF (congestive heart failure) (HCC)   . Opiate abuse, continuous (HCC)    Heroin    Past Surgical History:  Procedure Laterality Date  . APPLICATION OF ANGIOVAC N/A 05/10/2020   Procedure: APPLICATION OF ANGIOVAC;  Surgeon: [name], MD;  Location: [facility] OR;  Service: Vascular;  Laterality: N/A;  . BUBBLE STUDY  05/06/2020   Procedure: BUBBLE STUDY;  Surgeon: [name], MD;  Location: [facility] ENDOSCOPY;  Service: Cardiovascular;;  . I & D EXTREMITY Bilateral 05/03/2020   Procedure: BILATERAL IRRIGATION AND DEBRIDEMENT HANDS;  Surgeon: [name], MD;  Location: [facility] ORS;  Service: Orthopedics;  Laterality: Bilateral;  . MULTIPLE EXTRACTIONS WITH ALVEOLOPLASTY N/A 06/14/2020   Procedure: Surgical extraction of tooth #'s 3 and 28 with alveoloplasty and gross debridement of remaining dentition.;  Surgeon: [name], DDS;  Location: [facility] OR;  Service: Oral Surgery;  Laterality: N/A;  . TEE WITHOUT CARDIOVERSION N/A 05/06/2020   Procedure: TRANSESOPHAGEAL ECHOCARDIOGRAM (TEE);  Surgeon: [name], MD;  Location: [facility] ENDOSCOPY;  Service: Cardiovascular;  Laterality: N/A;  . TEE WITHOUT CARDIOVERSION N/A 05/10/2020   Procedure: TRANSESOPHAGEAL ECHOCARDIOGRAM (TEE);  Surgeon: [name], MD;  Location: [facility] OR;  Service: Thoracic;  Laterality: N/A;   Family History: History reviewed. No pertinent family history. Family Psychiatric  History: See admission H&P Social History:  Social History   Substance and Sexual Activity  Alcohol Use Not  Currently   Comment: rarely     Social History   Substance and Sexual Activity  Drug Use Not Currently  . Types: Marijuana, IV, "Crack" cocaine, Cocaine, Fentanyl, Methamphetamines, Amphetamines   Comment: Heroin    Social History   Socioeconomic History  . Marital status: Single    Spouse name: Not on file  . Number of  children: Not on file  . Years of education: Not on file  . Highest education level: Not on file  Occupational History  . Not on file  Tobacco Use  . Smoking status: Former Smoker    Packs/day: 0.50  . Smokeless tobacco: Never Used  Vaping Use  . Vaping Use: Never used  Substance and Sexual Activity  . Alcohol use: Not Currently    Comment: rarely  . Drug use: Not Currently    Types: Marijuana, IV, "Crack" cocaine, Cocaine, Fentanyl, Methamphetamines, Amphetamines    Comment: Heroin  . Sexual activity: Yes  Other Topics Concern  . Not on file  Social History Narrative  . Not on file   Social Determinants of Health   Financial Resource Strain: Not on file  Food Insecurity: Not on file  Transportation Needs: Not on file  Physical Activity: Not on file  Stress: Not on file  Social Connections: Not on file   Additional Social History:                         Sleep: Good  Appetite:  Good  Current Medications: Current Facility-Administered Medications  Medication Dose Route Frequency Provider Last Rate Last Admin  . acetaminophen (TYLENOL) tablet 650 mg  650 mg Oral Q6H PRN Nwoko, Uchenna E, PA      . alum & mag hydroxide-simeth (MAALOX/MYLANTA) 200-200-20 MG/5ML suspension 30 mL  30 mL Oral Q4H PRN Nwoko, Uchenna E, PA      . folic acid (FOLVITE) tablet 1 mg  1 mg Oral Daily Shadoe Bethel Lawson, MD      . gabapentin (NEURONTIN) capsule 300 mg  300 mg Oral TID Antonieta Pertlary, Harrison Zetina Lawson,Antonieta Pert MD   300 mg at 10/17/20 1808  . hydrOXYzine (ATARAX/VISTARIL) tablet 25 mg  25 mg Oral TID PRN Nwoko, Uchenna E, PA      . ivabradine (CORLANOR) tablet 5 mg  5 mg Oral BID WC Antonieta Pertlary, Paiden Caraveo Lawson, MD      . LORazepam (ATIVAN) tablet 1 mg  1 mg Oral Q6H PRN Antonieta Pertlary, Jilliane Kazanjian Lawson, MD      . OLANZapine zydis (ZYPREXA) disintegrating tablet 10 mg  10 mg Oral Q8H PRN Antonieta Pertlary, Myla Mauriello Lawson, MD       And  . LORazepam (ATIVAN) tablet 1 mg  1 mg Oral Q6H PRN Antonieta Pertlary, Brealyn Baril Lawson, MD       And  .  ziprasidone (GEODON) injection 20 mg  20 mg Intramuscular Q6H PRN Antonieta Pertlary, Ellaree Gear Lawson, MD      . magnesium hydroxide (MILK OF MAGNESIA) suspension 30 mL  30 mL Oral Daily PRN Nwoko, Uchenna E, PA      . sacubitril-valsartan (ENTRESTO) 49-51 mg per tablet  1 tablet Oral BID Antonieta Pertlary, [place] Luppino Lawson, MD   1 tablet at 10/17/20 1807  . spironolactone (ALDACTONE) tablet 25 mg  25 mg Oral Daily Antonieta Pertlary, Aanvi Voyles Lawson, MD   25 mg at 10/17/20 1808  . thiamine tablet 100 mg  100 mg Oral Daily Antonieta Pertlary, Jahleah Mariscal Lawson, MD      . traZODone (DESYREL) tablet 50 mg  50 mg Oral QHS PRN Meta Hatchet, PA        Lab Results:  Results for orders placed or performed during the hospital encounter of 10/16/20 (from the past 48 hour(s))  Lipid panel     Status: Abnormal   Collection Time: 10/17/20  6:02 PM  Result Value Ref Range   Cholesterol 177 0 - 200 mg/dL   Triglycerides 75 <825 mg/dL   HDL 45 >05 mg/dL   Total CHOL/HDL Ratio 3.9 RATIO   VLDL 15 0 - 40 mg/dL   LDL Cholesterol 397 (H) 0 - 99 mg/dL    Comment:        Total Cholesterol/HDL:CHD Risk Coronary Heart Disease Risk Table                     Men   Women  1/2 Average Risk   3.4   3.3  Average Risk       5.0   4.4  2 X Average Risk   9.6   7.1  3 X Average Risk  23.4   11.0        Use the calculated Patient Ratio above and the CHD Risk Table to determine the patient's CHD Risk.        ATP III CLASSIFICATION (LDL):  <100     mg/dL   Optimal  673-419  mg/dL   Near or Above                    Optimal  130-159  mg/dL   Borderline  379-024  mg/dL   High  >097     mg/dL   Very High Performed at [facility], [address], [place], [place] [address]   Hemoglobin A1c     Status: None   Collection Time: 10/17/20  6:02 PM  Result Value Ref Range   Hgb A1c MFr Bld 5.2 4.8 - 5.6 %    Comment: (NOTE) Pre diabetes:          5.7%-6.4%  Diabetes:              >6.4%  Glycemic control for   <7.0% adults with diabetes    Mean Plasma  Glucose 102.54 mg/dL    Comment: Performed at [facility], [address] [place], [place] [address]  TSH     Status: None   Collection Time: 10/17/20  6:02 PM  Result Value Ref Range   TSH 1.800 0.350 - 4.500 uIU/mL    Comment: Performed by a 3rd Generation assay with a functional sensitivity of <=0.01 uIU/mL. Performed at [facility], [address] [place], [place] [address]   Brain natriuretic peptide     Status: None   Collection Time: 10/17/20  6:02 PM  Result Value Ref Range   B Natriuretic Peptide 32.8 0.0 - 100.0 pg/mL    Comment: Performed at [facility], [address] [place], [place] [address]    Blood Alcohol level:  Lab Results  Component Value Date   ETH <10 10/16/2020   ETH <10 04/28/2020    Metabolic Disorder Labs: Lab Results  Component Value Date   HGBA1C 5.2 10/17/2020   MPG 102.54 10/17/2020   No results found for: PROLACTIN Lab Results  Component Value Date   CHOL 177 10/17/2020   TRIG 75 10/17/2020   HDL 45 10/17/2020   CHOLHDL 3.9 10/17/2020   VLDL 15 10/17/2020  LDLCALC 117 (H) 10/17/2020   LDLCALC 88 05/06/2019    Physical Findings: AIMS: Facial and Oral Movements Muscles of Facial Expression: None, normal Lips and Perioral Area: None, normal Jaw: None, normal Tongue: None, normal,Extremity Movements Upper (arms, wrists, hands, fingers): None, normal Lower (legs, knees, ankles, toes): None, normal, Trunk Movements Neck, shoulders, hips: None, normal, Overall Severity Severity of abnormal movements (highest score from questions above): None, normal Incapacitation due to abnormal movements: None, normal Patient's awareness of abnormal movements (rate only patient's report): No Awareness, Dental Status Current problems with teeth and/or dentures?: No Does patient usually wear dentures?: No  CIWA:    COWS:     Musculoskeletal: Strength & Muscle Tone: within normal limits Gait &  Station: normal Patient leans: N/A  Psychiatric Specialty Exam: Physical Exam Vitals and nursing note reviewed.  HENT:     Head: Normocephalic and atraumatic.  Pulmonary:     Effort: Pulmonary effort is normal.  Neurological:     General: No focal deficit present.     Mental Status: He is alert and oriented to person, place, and time.     Review of Systems  Resp. rate (P) 17, height 5\' 7"  (1.702 m).Body mass index is 24.75 kg/m.  General Appearance: Disheveled  Eye Contact:  Fair  Speech:  Normal Rate  Volume:  Decreased  Mood:  Dysphoric  Affect:  Congruent  Thought Process:  Coherent and Descriptions of Associations: Intact  Orientation:  Full (Time, Place, and Person)  Thought Content:  Logical  Suicidal Thoughts:  No  Homicidal Thoughts:  No  Memory:  Immediate;   Fair Recent;   Fair Remote;   Fair  Judgement:  Impaired  Insight:  Lacking  Psychomotor Activity:  Decreased  Concentration:  Concentration: Fair and Attention Span: Fair  Recall:  of Knowledge:  Fair  Language:  Good  Akathisia:  Negative  Handed:  Right  AIMS (if indicated):     Assets:  Desire for Improvement Resilience  ADL's:  Intact  Cognition:  WNL  Sleep:        Treatment Plan Summary: Daily contact with patient to assess and evaluate symptoms and progress in treatment, Medication management and Plan : Patient is seen and examined.  Patient is a 28 year old male with the above-stated past psychiatric history who is seen in follow-up.   Diagnosis: 1.  Substance-induced psychotic disorder 2.  Delirium secondary to methamphetamines. 3.  Methamphetamine dependence. 4.  History of polysubstance dependence including opiates and methamphetamines 5.  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 6.  Congestive heart failure secondary to the above.  Pertinent findings on examination today: 1.  Mentation is much clearer than  yesterday. 2.  No evidence of active psychosis. 3.  Sleep is significantly improved. 4.  He is alert and oriented x3. 5.  BNP from yesterday was 32.  Plan: 1.  Continue folic acid 1 mg p.o. daily for nutritional supplementation. 2.  Increase gabapentin to 400 mg p.o. 3 times daily for anxiety, mood stability and chronic pain. 3.  Continue Atarax 25 mg p.o. 3 times daily as needed anxiety. 4.  Continue Corlanor 5 mg p.o. twice daily with food for heart failure. 5.  Continue lorazepam 1 mg p.o. every 6 hours as needed a CIWA greater than 10. 6.  Continue olanzapine agitation protocol as needed. 7.  Continue Entresto 49/51 p.o. twice daily for heart failure. 8.  Continue spironolactone 25  mg p.o. daily for heart failure. 9.  Continue thiamine 100 mg p.o. daily for nutritional supplementation. 10.  Continue trazodone 50 mg p.o. nightly as needed insomnia. 11.  I will continue to hold his Zoloft at this point until we repeat his liver function enzymes. 12.  Disposition planning-in progress.  Antonieta Pert, MD 10/18/2020, 11:01 AM

## 2020-10-18 NOTE — BHH Counselor (Signed)
CSW attempted to complete PSA, however this patient politely requested to be seen later in the afternoon.   CSW will attempt to complete PSA at a later time.    Ruthann Cancer MSW, LCSW Clincal Social Worker  [facility]

## 2020-10-19 NOTE — BHH Suicide Risk Assessment (Signed)
[place] INPATIENT:  Family/Significant Other Suicide Prevention Education  Suicide Prevention Education: Education Completed; Mother, Gypsy Balsam [id number]), has been identified by the patient as the family member/significant other with whom the patient will be residing, and identified as the person(s) who will aid the patient in the event of a mental health crisis (suicidal ideations/suicide attempt).  With written consent from the patient, the family member/significant other has been provided the following suicide prevention education, prior to the and/or following the discharge of the patient.  The suicide prevention education provided includes the following:  Suicide risk factors  Suicide prevention and interventions  National Suicide Hotline telephone number  [facility] assessment telephone number  [facility] Emergency Assistance [place] and/or Residential Mobile Crisis Unit telephone number   Request made of family/significant other to:  Remove weapons (e.g., guns, rifles, knives), all items previously/currently identified as safety concern.    Remove drugs/medications (over-the-counter, prescriptions, illicit drugs), all items previously/currently identified as a safety concern.   The family member/significant other verbalizes understanding of the suicide prevention education information provided.  The family member/significant other agrees to remove the items of safety concern listed above.  CSW spoke with this patients mother who stated that this patient recently began hanging out with an old friend he had met in treatment at [facility] years ago. She stated she began to notice red flags around 2 weeks ago when his regular routines began to change. She stated he was out all night "camping" the night before Christmas Eve and when he arrived home he was paranoid and speaking non-sense. She stated he continued to remain this way until they brought him  the hospital to be evaluated.   Per mother she believes this patient has declined since being started on Klonapin from his psychiatrist. She stated he has been prescribed Klonapin, Seroquel, Prozac, and Gabapentin.   Mother stated that this patient is able to return home at discharge and that she will be able to pick him up when he is discharged.   Darletta Moll MSW, LCSW Clincal Social Worker  [facility]
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
## 2020-10-19 NOTE — BHH Group Notes (Signed)
Pt did not attend wrap up group this evening. Pt was in bed sleeping.  

## 2020-10-19 NOTE — Progress Notes (Signed)
Pt refused HS medications    10/19/20 0300  Psych Admission Type (Psych Patients Only)  Admission Status Voluntary  Psychosocial Assessment  Patient Complaints None  Eye Contact Intense;Watchful  Facial Expression Anxious;Pensive;Worried  Affect Labile;Preoccupied  Museum/gallery exhibitions officer;Minimal  Motor Activity Slow (steady gait)  Appearance/Hygiene Improved (Has his clothes on so far)  Behavior Characteristics Unwilling to participate  Thought Process  Coherency Disorganized;Tangential  Content Delusions;Paranoia;Preoccupation  Delusions Paranoid  Perception Derealization  Hallucination None reported or observed  Judgment Impaired  Confusion Mild  Danger to Self  Current suicidal ideation? Denies  Danger to Others  Danger to Others None reported or observed

## 2020-10-19 NOTE — Progress Notes (Signed)
[facility] MD Progress Note  10/19/2020 11:47 AM Clifford Clark  MRN:  [id number] Subjective:  Patient is a 29 year old male with a past psychiatric history significant for polysubstance dependence who presented to the [facility] emergency department on 10/16/2020 acutely psychotic.  Objective: Patient is seen and examined.  Patient is a 29 year old male with the above-stated past psychiatric history who is seen in follow-up.  He continues to clear.  He is alert and oriented x3.  He remains somewhat tachycardic.  His rate is between 119 and 121.  Blood pressure is stable.  Sleep was not recorded, but he did sleep well last night.  He denied any auditory or visual hallucinations.  He denied any suicidal or homicidal ideation.  He apologized for his behavior on admission.  We discussed that.  No new laboratories.  Principal Problem: <principal problem not specified> Diagnosis: Active Problems:   MDD (major depressive disorder), recurrent episode, severe (HCC)  Total Time spent with patient: 20 minutes  Past Psychiatric History: See admission H&P  Past Medical History:  Past Medical History:  Diagnosis Date   Asthma    CHF (congestive heart failure) (HCC)    Opiate abuse, continuous (HCC)    Heroin    Past Surgical History:  Procedure Laterality Date   APPLICATION OF ANGIOVAC N/A 05/10/2020   Procedure: APPLICATION OF ANGIOVAC;  Surgeon: [name], MD;  Location: [facility] OR;  Service: Vascular;  Laterality: N/A;   BUBBLE STUDY  05/06/2020   Procedure: BUBBLE STUDY;  Surgeon: [name], MD;  Location: [facility] ENDOSCOPY;  Service: Cardiovascular;;   I & D EXTREMITY Bilateral 05/03/2020   Procedure: BILATERAL IRRIGATION AND DEBRIDEMENT HANDS;  Surgeon: [name], MD;  Location: [facility] ORS;  Service: Orthopedics;  Laterality: Bilateral;   MULTIPLE EXTRACTIONS WITH ALVEOLOPLASTY N/A 06/14/2020   Procedure: Surgical extraction of tooth #'s 3 and 28 with  alveoloplasty and gross debridement of remaining dentition.;  Surgeon: [name], DDS;  Location: [facility] OR;  Service: Oral Surgery;  Laterality: N/A;   TEE WITHOUT CARDIOVERSION N/A 05/06/2020   Procedure: TRANSESOPHAGEAL ECHOCARDIOGRAM (TEE);  Surgeon: [name], MD;  Location: [facility] ENDOSCOPY;  Service: Cardiovascular;  Laterality: N/A;   TEE WITHOUT CARDIOVERSION N/A 05/10/2020   Procedure: TRANSESOPHAGEAL ECHOCARDIOGRAM (TEE);  Surgeon: [name], MD;  Location: [facility] OR;  Service: Thoracic;  Laterality: N/A;   Family History: History reviewed. No pertinent family history. Family Psychiatric  History: See admission H&P Social History:  Social History   Substance and Sexual Activity  Alcohol Use Not Currently   Comment: rarely     Social History   Substance and Sexual Activity  Drug Use Not Currently   Types: Marijuana, IV, "Crack" cocaine, Cocaine, Fentanyl, Methamphetamines, Amphetamines   Comment: Heroin    Social History   Socioeconomic History   Marital status: Single    Spouse name: Not on file   Number of children: Not on file   Years of education: Not on file   Highest education level: Not on file  Occupational History   Not on file  Tobacco Use   Smoking status: Former Smoker    Packs/day: 0.50   Smokeless tobacco: Never Used  Vaping Use   Vaping Use: Never used  Substance and Sexual Activity   Alcohol use: Not Currently    Comment: rarely   Drug use: Not Currently    Types: Marijuana, IV, "Crack" cocaine, Cocaine, Fentanyl, Methamphetamines, Amphetamines    Comment: Heroin  Sexual activity: Yes  Other Topics Concern   Not on file  Social History Narrative   Not on file   Social Determinants of Health   Financial Resource Strain: Not on file  Food Insecurity: Not on file  Transportation Needs: Not on file  Physical Activity: Not on file  Stress: Not on file  Social Connections: Not on file   Additional Social  History:                         Sleep: Good  Appetite:  Fair  Current Medications: Current Facility-Administered Medications  Medication Dose Route Frequency Provider Last Rate Last Admin   acetaminophen (TYLENOL) tablet 650 mg  650 mg Oral Q6H PRN Nwoko, Uchenna E, PA       alum & mag hydroxide-simeth (MAALOX/MYLANTA) 200-200-20 MG/5ML suspension 30 mL  30 mL Oral Q4H PRN Nwoko, Uchenna E, PA       folic acid (FOLVITE) tablet 1 mg  1 mg Oral Daily Antonieta Pert, MD   1 mg at 10/19/20 8315   gabapentin (NEURONTIN) capsule 300 mg  300 mg Oral TID Antonieta Pert, MD   300 mg at 10/19/20 0825   hydrOXYzine (ATARAX/VISTARIL) tablet 25 mg  25 mg Oral TID PRN Nwoko, Uchenna E, PA       ivabradine (CORLANOR) tablet 5 mg  5 mg Oral BID WC Antonieta Pert, MD   5 mg at 10/19/20 0825   LORazepam (ATIVAN) tablet 1 mg  1 mg Oral Q6H PRN Antonieta Pert, MD       OLANZapine zydis (ZYPREXA) disintegrating tablet 10 mg  10 mg Oral Q8H PRN Antonieta Pert, MD       And   LORazepam (ATIVAN) tablet 1 mg  1 mg Oral Q6H PRN Antonieta Pert, MD       And   ziprasidone (GEODON) injection 20 mg  20 mg Intramuscular Q6H PRN Antonieta Pert, MD       magnesium hydroxide (MILK OF MAGNESIA) suspension 30 mL  30 mL Oral Daily PRN Nwoko, Uchenna E, PA       OLANZapine zydis (ZYPREXA) disintegrating tablet 10 mg  10 mg Oral QHS Antonieta Pert, MD       sacubitril-valsartan (ENTRESTO) 49-51 mg per tablet  1 tablet Oral BID Antonieta Pert, MD   1 tablet at 10/19/20 1761   spironolactone (ALDACTONE) tablet 25 mg  25 mg Oral Daily Antonieta Pert, MD   25 mg at 10/19/20 6073   thiamine tablet 100 mg  100 mg Oral Daily Antonieta Pert, MD   100 mg at 10/19/20 7106   traZODone (DESYREL) tablet 50 mg  50 mg Oral QHS PRN Meta Hatchet, PA        Lab Results:  Results for orders placed or performed during the hospital encounter of 10/16/20 (from the past 48  hour(s))  Lipid panel     Status: Abnormal   Collection Time: 10/17/20  6:02 PM  Result Value Ref Range   Cholesterol 177 0 - 200 mg/dL   Triglycerides 75 <269 mg/dL   HDL 45 >48 mg/dL   Total CHOL/HDL Ratio 3.9 RATIO   VLDL 15 0 - 40 mg/dL   LDL Cholesterol 546 (H) 0 - 99 mg/dL    Comment:        Total Cholesterol/HDL:CHD Risk Coronary Heart Disease Risk Table  Men   Women  1/2 Average Risk   3.4   3.3  Average Risk       5.0   4.4  2 X Average Risk   9.6   7.1  3 X Average Risk  23.4   11.0        Use the calculated Patient Ratio above and the CHD Risk Table to determine the patient's CHD Risk.        ATP III CLASSIFICATION (LDL):  <100     mg/dL   Optimal  [id number]  mg/dL   Near or Above                    Optimal  130-159  mg/dL   Borderline  [phone]  mg/dL   High  >956>190     mg/dL   Very High Performed at [facility], [address] [place], [place] [address]   Hemoglobin A1c     Status: None   Collection Time: 10/17/20  6:02 PM  Result Value Ref Range   Hgb A1c MFr Bld 5.2 4.8 - 5.6 %    Comment: (NOTE) Pre diabetes:          5.7%-6.4%  Diabetes:              >6.4%  Glycemic control for   <7.0% adults with diabetes    Mean Plasma Glucose 102.54 mg/dL    Comment: Performed at [facility], [address] [place], [place] [address]  TSH     Status: None   Collection Time: 10/17/20  6:02 PM  Result Value Ref Range   TSH 1.800 0.350 - 4.500 uIU/mL    Comment: Performed by a 3rd Generation assay with a functional sensitivity of <=0.01 uIU/mL. Performed at [facility], [address] [place], [place] [address]   Brain natriuretic peptide     Status: None   Collection Time: 10/17/20  6:02 PM  Result Value Ref Range   B Natriuretic Peptide 32.8 0.0 - 100.0 pg/mL    Comment: Performed at [facility], [address] [place], [place] [address]    Blood Alcohol level:  Lab  Results  Component Value Date   ETH <10 10/16/2020   ETH <10 04/28/2020    Metabolic Disorder Labs: Lab Results  Component Value Date   HGBA1C 5.2 10/17/2020   MPG 102.54 10/17/2020   No results found for: PROLACTIN Lab Results  Component Value Date   CHOL 177 10/17/2020   TRIG 75 10/17/2020   HDL 45 10/17/2020   CHOLHDL 3.9 10/17/2020   VLDL 15 10/17/2020   LDLCALC 117 (H) 10/17/2020   LDLCALC 88 05/06/2019    Physical Findings: AIMS: Facial and Oral Movements Muscles of Facial Expression: None, normal Lips and Perioral Area: None, normal Jaw: None, normal Tongue: None, normal,Extremity Movements Upper (arms, wrists, hands, fingers): None, normal Lower (legs, knees, ankles, toes): None, normal, Trunk Movements Neck, shoulders, hips: None, normal, Overall Severity Severity of abnormal movements (highest score from questions above): None, normal Incapacitation due to abnormal movements: None, normal Patient's awareness of abnormal movements (rate only patient's report): No Awareness, Dental Status Current problems with teeth and/or dentures?: No Does patient usually wear dentures?: No  CIWA:    COWS:     Musculoskeletal: Strength & Muscle Tone: within normal limits Gait & Station: normal Patient leans: N/A  Psychiatric Specialty Exam: Physical Exam Vitals and nursing note reviewed.  HENT:     Head: Normocephalic and atraumatic.  Pulmonary:     Effort: Pulmonary effort is normal.  Neurological:     General: No focal deficit present.     Mental Status: He is alert and oriented to person, place, and time.     Review of Systems  Blood pressure 107/82, pulse (!) 121, temperature 97.9 F (36.6 C), temperature source Oral, resp. rate (P) 17, height 5\' 7"  (1.702 m).Body mass index is 24.75 kg/m.  General Appearance: Disheveled  Eye Contact:  Fair  Speech:  Normal Rate  Volume:  Decreased  Mood:  Anxious and Dysphoric  Affect:  Congruent  Thought Process:   Coherent and Descriptions of Associations: Intact  Orientation:  Full (Time, Place, and Person)  Thought Content:  Logical  Suicidal Thoughts:  No  Homicidal Thoughts:  No  Memory:  Immediate;   Fair Recent;   Fair Remote;   Fair  Judgement:  Intact  Insight:  Fair  Psychomotor Activity:  Decreased  Concentration:  Concentration: Fair and Attention Span: Fair  Recall:  of Knowledge:  Fair  Language:  Good  Akathisia:  Negative  Handed:  Right  AIMS (if indicated):     Assets:  Desire for Improvement Resilience Social Support  ADL's:  Intact  Cognition:  WNL  Sleep:        Treatment Plan Summary: Daily contact with patient to assess and evaluate symptoms and progress in treatment, Medication management and Plan : Patient is seen and examined.  Patient is a 29 year old male with the above-stated past psychiatric history who is seen in follow-up.   Diagnosis: 1.  Substance-induced psychotic disorder 2.  Delirium secondary to methamphetamines. 3.  Methamphetamine dependence. 4.  History of polysubstance dependence including opiates and methamphetamines 5.  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 6.  Congestive heart failure secondary to the above.  Pertinent findings on examination today: 1.  His cognition and mentation is improving. 2.  No evidence of active psychosis. 3.  Sleep is adequate. 4.  He is alert and oriented x3. 5.  He is tachycardic.  Plan: 1.  Continue folic acid 1 mg p.o. daily for nutritional supplementation. 2.  Increase gabapentin to 600 mg p.o. 3 times daily for anxiety, mood stability and chronic pain. 3.  Continue Atarax 25 mg p.o. 3 times daily as needed anxiety. 4.  Continue Corlanor 5 mg p.o. twice daily with food for heart failure. 5.  Continue lorazepam 1 mg p.o. every 6 hours as needed a CIWA greater than 10. 6.  Continue olanzapine agitation protocol as needed. 7.  Continue  Entresto 49/51 p.o. twice daily for heart failure. 8.  Continue spironolactone 25 mg p.o. daily for heart failure. 9.  Continue thiamine 100 mg p.o. daily for nutritional supplementation. 10.  Continue trazodone 50 mg p.o. nightly as needed insomnia. 11.  Decrease Zyprexa to 7.5 mg p.o. nightly for psychosis and mood stability. 12.  Order metabolic panel to assess creatinine and liver function enzymes. 13. I will continue to hold his Zoloft at this point until we repeat his liver function enzymes. 14.  Disposition planning-in progress.  26, MD 10/19/2020, 11:47 AM

## 2020-10-20 LAB — COMPREHENSIVE METABOLIC PANEL
ALT: 339 U/L — ABNORMAL HIGH (ref 0–44)
AST: 136 U/L — ABNORMAL HIGH (ref 15–41)
Albumin: 4.8 g/dL (ref 3.5–5.0)
Alkaline Phosphatase: 66 U/L (ref 38–126)
Anion gap: 9 (ref 5–15)
BUN: 21 mg/dL — ABNORMAL HIGH (ref 6–20)
CO2: 28 mmol/L (ref 22–32)
Calcium: 9.7 mg/dL (ref 8.9–10.3)
Chloride: 100 mmol/L (ref 98–111)
Creatinine, Ser: 1.38 mg/dL — ABNORMAL HIGH (ref 0.61–1.24)
GFR, Estimated: >60 mL/min (ref >60)
Glucose, Bld: 90 mg/dL (ref 70–99)
Potassium: 4 mmol/L (ref 3.5–5.1)
Sodium: 137 mmol/L (ref 135–145)
Total Bilirubin: 1.4 mg/dL — ABNORMAL HIGH (ref 0.3–1.2)
Total Protein: 7.9 g/dL (ref 6.5–8.1)

## 2020-10-20 MED ORDER — IVABRADINE HCL 5 MG PO TABS
5.0000 mg | ORAL_TABLET | Freq: Two times a day (BID) | ORAL | Status: DC
  Administered 2020-10-20 – 2020-10-21 (×2): 5 mg via ORAL
  Filled 2020-10-20 (×2): qty 1
  Filled 2020-10-20: qty 2
  Filled 2020-10-20 (×2): qty 1
  Filled 2020-10-20 (×3): qty 2

## 2020-10-20 MED ORDER — OLANZAPINE 5 MG PO TBDP
5.0000 mg | ORAL_TABLET | Freq: Every day | ORAL | Status: DC
  Administered 2020-10-20: 21:00:00 5 mg via ORAL
  Filled 2020-10-20: qty 1
  Filled 2020-10-20: qty 7
  Filled 2020-10-20: qty 1
  Filled 2020-10-20: qty 7

## 2020-10-20 NOTE — Progress Notes (Signed)
   10/20/20 0020  Psych Admission Type (Psych Patients Only)  Admission Status Voluntary  Psychosocial Assessment  Patient Complaints None  Eye Contact Brief  Facial Expression Flat  Affect Appropriate to circumstance  Speech Logical/coherent  Interaction Minimal  Appearance/Hygiene Unremarkable  Behavior Characteristics Cooperative  Mood Anxious  Thought Process  Coherency WDL  Content WDL  Delusions WDL  Perception WDL  Hallucination None reported or observed  Judgment WDL  Confusion WDL  Danger to Self  Current suicidal ideation? Denies  Danger to Others  Danger to Others None reported or observed

## 2020-10-20 NOTE — Progress Notes (Signed)
Pt has been calm and cooperative this shift.  Pt taking medications as prescribed and pt denied any adverse reactions to medications.  Pt denies SI/HI/AVH.  RN assessed for needs and concerns and provided support.  Pt remains safe on the unit with q 15 min checks.

## 2020-10-20 NOTE — Progress Notes (Signed)
   10/20/20 2119  COVID-19 Daily Checkoff  Have you had a fever (temp > 37.80C/100F)  in the past 24 hours?  No  COVID-19 EXPOSURE  Have you traveled outside the state in the past 14 days? No  Have you been in contact with someone with a confirmed diagnosis of COVID-19 or PUI in the past 14 days without wearing appropriate PPE? No  Have you been living in the same home as a person with confirmed diagnosis of COVID-19 or a PUI (household contact)? No  Have you been diagnosed with COVID-19? No

## 2020-10-20 NOTE — Progress Notes (Signed)
Pt was present in the dayroom tonight and has been interacting with his peers. His goal is to continue to work on improving his communication. He was asking about discharge and was informed about actions he can take to meet his planning. He did say he would have trouble getting his medications upon discharge and was informed that this information would be passed on so he can receive a supply of his medications before he discharges. He said that he recently applied for Medicaid. He also expresses concern about his discharge location since he is currently homeless. He said he is interested in attending a rehab center. Pt denies SI/HI and AVH. Medications administered as ordered by MD. Active listening, reassurance, and support provided. Q 15 min safety checks continue. Pt's safety has been maintained.   10/20/20 2119  Psych Admission Type (Psych Patients Only)  Admission Status Voluntary  Psychosocial Assessment  Patient Complaints None  Eye Contact Fair;Suspiciousness;Watchful  Facial Expression Anxious  Affect Appropriate to circumstance  Speech Logical/coherent  Interaction Minimal;Forwards little  Motor Activity Other (Comment) (WNL)  Appearance/Hygiene Unremarkable  Behavior Characteristics Cooperative;Anxious;Calm  Mood Anxious;Suspicious;Pleasant  Thought Process  Coherency WDL  Content Paranoia  Delusions Paranoid  Perception WDL  Hallucination None reported or observed  Judgment Limited  Confusion None  Danger to Self  Current suicidal ideation? Denies  Danger to Others  Danger to Others None reported or observed

## 2020-10-20 NOTE — [facility] Group Notes (Signed)
Adult Psychoeducational Group Note  Date:  10/20/2020 Time:  10:31 AM  Group Topic/Focus:  Goals Group:   The focus of this group is to help patients establish daily goals to achieve during treatment and discuss how the patient can incorporate goal setting into their daily lives to aide in recovery.  Participation Level:  Did Not Attend  Clifford Clark 10/20/2020, 10:31 AM 

## 2020-10-20 NOTE — Progress Notes (Signed)
[facility] MD Progress Note  10/20/2020 12:12 PM Clifford Clark  MRN:  [id number] Subjective:  Patient is a 29 year old male with a past psychiatric history significant for polysubstance dependence who presented to the [facility] emergency department on 10/16/2020 acutely psychotic.  Objective: Patient is seen and examined.  Patient is a 29 year old male with the above-stated past psychiatric history who is seen in follow-up.  He continues to clear from a psychotic episode.  He is up and awake today.  He denied any auditory or visual hallucinations.  He denied any suicidal or homicidal ideation.  His vital signs are stable, he is afebrile.  Sleep was not recorded, but he stated he slept well.  He denied any chest pain, shortness of breath or any symptoms of worsening heart failure.  Repeat of his lab work revealed that his creatinine has increased to 1.38 from 0.98.  His AST is gone from 69 to 136, and his ALT is increased from 215 to 339.  Principal Problem: <principal problem not specified> Diagnosis: Active Problems:   MDD (major depressive disorder), recurrent episode, severe (HCC)  Total Time spent with patient: 20 minutes  Past Psychiatric History: See admission H&P  Past Medical History:  Past Medical History:  Diagnosis Date  . Asthma   . CHF (congestive heart failure) (HCC)   . Opiate abuse, continuous (HCC)    Heroin    Past Surgical History:  Procedure Laterality Date  . APPLICATION OF ANGIOVAC N/A 05/10/2020   Procedure: APPLICATION OF ANGIOVAC;  Surgeon: [name], MD;  Location: [facility] OR;  Service: Vascular;  Laterality: N/A;  . BUBBLE STUDY  05/06/2020   Procedure: BUBBLE STUDY;  Surgeon: [name], MD;  Location: [facility] ENDOSCOPY;  Service: Cardiovascular;;  . I & D EXTREMITY Bilateral 05/03/2020   Procedure: BILATERAL IRRIGATION AND DEBRIDEMENT HANDS;  Surgeon: [name], MD;  Location: [facility] ORS;  Service: Orthopedics;  Laterality: Bilateral;   . MULTIPLE EXTRACTIONS WITH ALVEOLOPLASTY N/A 06/14/2020   Procedure: Surgical extraction of tooth #'s 3 and 28 with alveoloplasty and gross debridement of remaining dentition.;  Surgeon: [name], DDS;  Location: [facility] OR;  Service: Oral Surgery;  Laterality: N/A;  . TEE WITHOUT CARDIOVERSION N/A 05/06/2020   Procedure: TRANSESOPHAGEAL ECHOCARDIOGRAM (TEE);  Surgeon: [name], MD;  Location: [facility] ENDOSCOPY;  Service: Cardiovascular;  Laterality: N/A;  . TEE WITHOUT CARDIOVERSION N/A 05/10/2020   Procedure: TRANSESOPHAGEAL ECHOCARDIOGRAM (TEE);  Surgeon: [name], MD;  Location: [facility] OR;  Service: Thoracic;  Laterality: N/A;   Family History: History reviewed. No pertinent family history. Family Psychiatric  History: See admission H&P Social History:  Social History   Substance and Sexual Activity  Alcohol Use Not Currently   Comment: rarely     Social History   Substance and Sexual Activity  Drug Use Not Currently  . Types: Marijuana, IV, "Crack" cocaine, Cocaine, Fentanyl, Methamphetamines, Amphetamines   Comment: Heroin    Social History   Socioeconomic History  . Marital status: Single    Spouse name: Not on file  . Number of children: Not on file  . Years of education: Not on file  . Highest education level: Not on file  Occupational History  . Not on file  Tobacco Use  . Smoking status: Former Smoker    Packs/day: 0.50  . Smokeless tobacco: Never Used  Vaping Use  . Vaping Use: Never used  Substance and Sexual Activity  . Alcohol use: Not Currently  Comment: rarely  . Drug use: Not Currently    Types: Marijuana, IV, "Crack" cocaine, Cocaine, Fentanyl, Methamphetamines, Amphetamines    Comment: Heroin  . Sexual activity: Yes  Other Topics Concern  . Not on file  Social History Narrative  . Not on file   Social Determinants of Health   Financial Resource Strain: Not on file  Food Insecurity: Not on file  Transportation Needs: Not on  file  Physical Activity: Not on file  Stress: Not on file  Social Connections: Not on file   Additional Social History:                         Sleep: Good  Appetite:  Good  Current Medications: Current Facility-Administered Medications  Medication Dose Route Frequency Provider Last Rate Last Admin  . acetaminophen (TYLENOL) tablet 650 mg  650 mg Oral Q6H PRN Nwoko, Uchenna E, PA      . alum & mag hydroxide-simeth (MAALOX/MYLANTA) 200-200-20 MG/5ML suspension 30 mL  30 mL Oral Q4H PRN Nwoko, Uchenna E, PA      . folic acid (FOLVITE) tablet 1 mg  1 mg Oral Daily Antonieta Pert, MD   1 mg at 10/20/20 0913  . gabapentin (NEURONTIN) capsule 300 mg  300 mg Oral TID Antonieta Pert, MD   300 mg at 10/20/20 0913  . hydrOXYzine (ATARAX/VISTARIL) tablet 25 mg  25 mg Oral TID PRN Nwoko, Uchenna E, PA      . ivabradine (CORLANOR) tablet 5 mg  5 mg Oral BID WC Antonieta Pert, MD   5 mg at 10/20/20 0913  . LORazepam (ATIVAN) tablet 1 mg  1 mg Oral Q6H PRN Antonieta Pert, MD      . OLANZapine zydis [facility]) disintegrating tablet 10 mg  10 mg Oral Q8H PRN Antonieta Pert, MD       And  . LORazepam (ATIVAN) tablet 1 mg  1 mg Oral Q6H PRN Antonieta Pert, MD       And  . ziprasidone (GEODON) injection 20 mg  20 mg Intramuscular Q6H PRN Antonieta Pert, MD      . magnesium hydroxide (MILK OF MAGNESIA) suspension 30 mL  30 mL Oral Daily PRN Nwoko, Uchenna E, PA      . OLANZapine zydis (ZYPREXA) disintegrating tablet 5 mg  5 mg Oral QHS Antonieta Pert, MD      . sacubitril-valsartan [facility]) 49-51 mg per tablet  1 tablet Oral BID Antonieta Pert, MD   1 tablet at 10/20/20 0913  . spironolactone (ALDACTONE) tablet 25 mg  25 mg Oral Daily Antonieta Pert, MD   25 mg at 10/20/20 0914  . thiamine tablet 100 mg  100 mg Oral Daily Antonieta Pert, MD   100 mg at 10/20/20 0913  . traZODone (DESYREL) tablet 50 mg  50 mg Oral QHS PRN Nwoko, Tommas Olp, PA         Lab Results:  Results for orders placed or performed during the hospital encounter of 10/16/20 (from the past 48 hour(s))  Comprehensive metabolic panel     Status: Abnormal   Collection Time: 10/20/20  6:31 AM  Result Value Ref Range   Sodium 137 135 - 145 mmol/L   Potassium 4.0 3.5 - 5.1 mmol/L   Chloride 100 98 - 111 mmol/L   CO2 28 22 - 32 mmol/L   Glucose, Bld 90 70 - 99 mg/dL  Comment: Glucose reference range applies only to samples taken after fasting for at least 8 hours.   BUN 21 (H) 6 - 20 mg/dL   Creatinine, Ser 1.611.38 (H) 0.61 - 1.24 mg/dL   Calcium 9.7 8.9 - 09.610.3 mg/dL   Total Protein 7.9 6.5 - 8.1 g/dL   Albumin 4.8 3.5 - 5.0 g/dL   AST 045136 (H) 15 - 41 U/L   ALT 339 (H) 0 - 44 U/L   Alkaline Phosphatase 66 38 - 126 U/L   Total Bilirubin 1.4 (H) 0.3 - 1.2 mg/dL   GFR, Estimated >40>60 >98>60 mL/min    Comment: (NOTE) Calculated using the CKD-EPI Creatinine Equation (2021)    Anion gap 9 5 - 15    Comment: Performed at [facility], [address] [place], [place] [address]    Blood Alcohol level:  Lab Results  Component Value Date   ETH <10 10/16/2020   ETH <10 04/28/2020    Metabolic Disorder Labs: Lab Results  Component Value Date   HGBA1C 5.2 10/17/2020   MPG 102.54 10/17/2020   No results found for: PROLACTIN Lab Results  Component Value Date   CHOL 177 10/17/2020   TRIG 75 10/17/2020   HDL 45 10/17/2020   CHOLHDL 3.9 10/17/2020   VLDL 15 10/17/2020   LDLCALC 117 (H) 10/17/2020   LDLCALC 88 05/06/2019    Physical Findings: AIMS: Facial and Oral Movements Muscles of Facial Expression: None, normal Lips and Perioral Area: None, normal Jaw: None, normal Tongue: None, normal,Extremity Movements Upper (arms, wrists, hands, fingers): None, normal Lower (legs, knees, ankles, toes): None, normal, Trunk Movements Neck, shoulders, hips: None, normal, Overall Severity Severity of abnormal movements (highest score from questions  above): None, normal Incapacitation due to abnormal movements: None, normal Patient's awareness of abnormal movements (rate only patient's report): No Awareness, Dental Status Current problems with teeth and/or dentures?: No Does patient usually wear dentures?: No  CIWA:    COWS:     Musculoskeletal: Strength & Muscle Tone: within normal limits Gait & Station: normal Patient leans: N/A  Psychiatric Specialty Exam: Physical Exam Vitals and nursing note reviewed.  Constitutional:      Appearance: Normal appearance.  HENT:     Head: Normocephalic and atraumatic.  Pulmonary:     Effort: Pulmonary effort is normal.  Neurological:     General: No focal deficit present.     Mental Status: He is alert and oriented to person, place, and time.     Review of Systems  Blood pressure 92/63, pulse 99, temperature 97.8 F (36.6 C), temperature source Oral, resp. rate 16, height 5\' 7"  (1.702 m).Body mass index is 24.75 kg/m.  General Appearance: Casual  Eye Contact:  Good  Speech:  Normal Rate  Volume:  Normal  Mood:  Euthymic  Affect:  Congruent  Thought Process:  Coherent and Descriptions of Associations: Intact  Orientation:  Full (Time, Place, and Person)  Thought Content:  Logical  Suicidal Thoughts:  No  Homicidal Thoughts:  No  Memory:  Immediate;   Good Recent;   Good Remote;   Good  Judgement:  Intact  Insight:  Fair  Psychomotor Activity:  Normal  Concentration:  Concentration: Fair and Attention Span: Fair  Recall:  [employer]  Fund of Knowledge:  Fair  Language:  Good  Akathisia:  Negative  Handed:  Right  AIMS (if indicated):     Assets:  Desire for Improvement Resilience Social Support  ADL's:  Intact  Cognition:  WNL  Sleep:        Treatment Plan Summary: Daily contact with patient to assess and evaluate symptoms and progress in treatment, Medication management and Plan : Patient is seen and examined.  Patient is a 29 year old male with the above-stated past  psychiatric history who is seen in follow-up.   Diagnosis: 1. Substance-induced psychotic disorder 2. Delirium secondary to methamphetamines. 3. Methamphetamine dependence. 4. History of polysubstance dependence including opiates and methamphetamines 5. 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 6. Congestive heart failure secondary to the above.  Pertinent findings on examination today: 1.  Patient continues to improve from his delirious/substance-induced psychotic disorder. 2.  No evidence of worsening heart failure. 3.  I have communicated with the patient's cardiologist about adjustments in his medication secondary to the elevated creatinine as well as elevated liver function enzymes that may be secondary to decreased cardiac output from low blood pressures.  Plan: 1. Continue folic acid 1 mg p.o. daily for nutritional supplementation. 2. Increase gabapentin to 600 mg p.o. 3 times daily for anxiety, mood stability and chronic pain. 3. Continue Atarax 25 mg p.o. 3 times daily as needed anxiety. 4. Continue Corlanor 5 mg p.o. twice daily with food for heart failure. 5. Continue lorazepam 1 mg p.o. every 6 hours as needed a CIWA greater than 10. 6. Continue olanzapine agitation protocol as needed. 7. Continue Entresto 49/51 p.o. twice daily for heart failure. 8. Hold spironolactone 25 mg p.o. daily for heart failure.  Creatinine is elevated, and may be over diuresed as well as causing low blood pressure that could lead to the increased liver function enzymes because of decreased cardiac function 9. Continue thiamine 100 mg p.o. daily for nutritional supplementation. 10. Continue trazodone 50 mg p.o. nightly as needed insomnia. 11.  Decrease Zyprexa to 5 mg p.o. nightly for psychosis and mood stability. 12.  Order metabolic panel to assess creatinine and liver function enzymes. 13.I will continue to hold his Zoloft at  this point until we repeat his liver function enzymes. 14.  Disposition planning-in progress.  Antonieta Pert, MD 10/20/2020, 12:12 PM

## 2020-10-21 DIAGNOSIS — F1994 Other psychoactive substance use, unspecified with psychoactive substance-induced mood disorder: Principal | ICD-10-CM

## 2020-10-21 MED ORDER — OLANZAPINE 5 MG PO TBDP: 5.0000 mg | ORAL_TABLET | Freq: Every day | ORAL | 0 refills | Status: DC

## 2020-10-21 MED ORDER — HYDROXYZINE HCL 25 MG PO TABS: 25.0000 mg | ORAL_TABLET | Freq: Three times a day (TID) | ORAL | 0 refills | Status: DC | PRN

## 2020-10-21 MED ORDER — TRAZODONE HCL 50 MG PO TABS: 50.0000 mg | ORAL_TABLET | Freq: Every evening | ORAL | 0 refills | Status: DC | PRN

## 2020-10-21 MED ORDER — GABAPENTIN 300 MG PO CAPS: 300.0000 mg | ORAL_CAPSULE | Freq: Three times a day (TID) | ORAL | 0 refills | Status: DC

## 2020-10-21 NOTE — Tx Team (Signed)
Interdisciplinary Treatment and Diagnostic Plan Update  10/21/2020 Time of Session: 9:05am Clifford Clark MRN: [id number]  Principal Diagnosis: Substance induced mood disorder (HCC)  Secondary Diagnoses: Principal Problem:   Substance induced mood disorder (HCC) Active Problems:   MDD (major depressive disorder), recurrent episode, severe (HCC)   Current Medications:  Current Facility-Administered Medications  Medication Dose Route Frequency Provider Last Rate Last Admin  . acetaminophen (TYLENOL) tablet 650 mg  650 mg Oral Q6H PRN Nwoko, Uchenna E, PA      . alum & mag hydroxide-simeth (MAALOX/MYLANTA) 200-200-20 MG/5ML suspension 30 mL  30 mL Oral Q4H PRN Nwoko, Uchenna E, PA      . folic acid (FOLVITE) tablet 1 mg  1 mg Oral Daily Antonieta Pert, MD   1 mg at 10/21/20 0754  . gabapentin (NEURONTIN) capsule 300 mg  300 mg Oral TID Antonieta Pert, MD   300 mg at 10/21/20 0754  . hydrOXYzine (ATARAX/VISTARIL) tablet 25 mg  25 mg Oral TID PRN Nwoko, Uchenna E, PA      . ivabradine (CORLANOR) tablet 5 mg  5 mg Oral BID WC Antonieta Pert, MD   5 mg at 10/21/20 0754  . LORazepam (ATIVAN) tablet 1 mg  1 mg Oral Q6H PRN Antonieta Pert, MD      . OLANZapine zydis [facility]) disintegrating tablet 10 mg  10 mg Oral Q8H PRN Antonieta Pert, MD       And  . LORazepam (ATIVAN) tablet 1 mg  1 mg Oral Q6H PRN Antonieta Pert, MD       And  . ziprasidone (GEODON) injection 20 mg  20 mg Intramuscular Q6H PRN Antonieta Pert, MD      . magnesium hydroxide (MILK OF MAGNESIA) suspension 30 mL  30 mL Oral Daily PRN Nwoko, Uchenna E, PA      . OLANZapine zydis (ZYPREXA) disintegrating tablet 5 mg  5 mg Oral QHS Antonieta Pert, MD   5 mg at 10/20/20 2119  . sacubitril-valsartan (ENTRESTO) 49-51 mg per tablet  1 tablet Oral BID Antonieta Pert, MD   1 tablet at 10/21/20 0754  . thiamine tablet 100 mg  100 mg Oral Daily Antonieta Pert, MD   100 mg at 10/21/20 0754  .  traZODone (DESYREL) tablet 50 mg  50 mg Oral QHS PRN Nwoko, Uchenna E, PA   50 mg at 10/20/20 2120   PTA Medications: Medications Prior to Admission  Medication Sig Dispense Refill Last Dose  . amoxicillin (AMOXIL) 500 MG capsule Take four capsules one hour before dental appointment. (Patient not taking: Reported on 10/16/2020) 4 capsule 3   . carvedilol (COREG) 6.25 MG tablet Take 1 tablet (6.25 mg total) by mouth 2 (two) times daily with a meal. 60 tablet 3   . clonazePAM (KLONOPIN) 0.5 MG tablet Take 0.5 mg by mouth daily.     Marland Kitchen gabapentin (NEURONTIN) 600 MG tablet Take 600 mg by mouth 3 (three) times daily.     . ivabradine (CORLANOR) 5 MG TABS tablet Take 1 tablet (5 mg total) by mouth 2 (two) times daily with a meal. 120 tablet 5   . sacubitril-valsartan (ENTRESTO) 49-51 MG Take 1 tablet by mouth 2 (two) times daily. 180 tablet 3   . sertraline (ZOLOFT) 50 MG tablet Take 50 mg by mouth daily.     Marland Kitchen spironolactone (ALDACTONE) 25 MG tablet Take 1 tablet (25 mg total) by mouth daily. 30 tablet 3  Patient Stressors: Marital or family conflict Medication change or noncompliance Substance abuse  Patient Strengths: Average or above average intelligence Capable of independent living  Treatment Modalities: Medication Management, Group therapy, Case management,  1 to 1 session with clinician, Psychoeducation, Recreational therapy.   Physician Treatment Plan for Primary Diagnosis: Substance induced mood disorder (HCC) Long Term Goal(s): Improvement in symptoms so as ready for discharge Improvement in symptoms so as ready for discharge   Short Term Goals: Ability to identify changes in lifestyle to reduce recurrence of condition will improve Ability to verbalize feelings will improve Ability to demonstrate self-control will improve Ability to identify and develop effective coping behaviors will improve Ability to maintain clinical measurements within normal limits will improve Ability  to identify triggers associated with substance abuse/mental health issues will improve Ability to identify changes in lifestyle to reduce recurrence of condition will improve Ability to verbalize feelings will improve Ability to demonstrate self-control will improve Ability to identify and develop effective coping behaviors will improve Ability to maintain clinical measurements within normal limits will improve Ability to identify triggers associated with substance abuse/mental health issues will improve  Medication Management: Evaluate patient's response, side effects, and tolerance of medication regimen.  Therapeutic Interventions: 1 to 1 sessions, Unit Group sessions and Medication administration.  Evaluation of Outcomes: Adequate for Discharge  Physician Treatment Plan for Secondary Diagnosis: Principal Problem:   Substance induced mood disorder (HCC) Active Problems:   MDD (major depressive disorder), recurrent episode, severe (HCC)  Long Term Goal(s): Improvement in symptoms so as ready for discharge Improvement in symptoms so as ready for discharge   Short Term Goals: Ability to identify changes in lifestyle to reduce recurrence of condition will improve Ability to verbalize feelings will improve Ability to demonstrate self-control will improve Ability to identify and develop effective coping behaviors will improve Ability to maintain clinical measurements within normal limits will improve Ability to identify triggers associated with substance abuse/mental health issues will improve Ability to identify changes in lifestyle to reduce recurrence of condition will improve Ability to verbalize feelings will improve Ability to demonstrate self-control will improve Ability to identify and develop effective coping behaviors will improve Ability to maintain clinical measurements within normal limits will improve Ability to identify triggers associated with substance abuse/mental  health issues will improve     Medication Management: Evaluate patient's response, side effects, and tolerance of medication regimen.  Therapeutic Interventions: 1 to 1 sessions, Unit Group sessions and Medication administration.  Evaluation of Outcomes: Adequate for Discharge   RN Treatment Plan for Primary Diagnosis: Substance induced mood disorder (HCC) Long Term Goal(s): Knowledge of disease and therapeutic regimen to maintain health will improve  Short Term Goals: Ability to remain free from injury will improve, Ability to verbalize frustration and anger appropriately will improve, Ability to demonstrate self-control, Ability to participate in decision making will improve, Ability to identify and develop effective coping behaviors will improve and Compliance with prescribed medications will improve  Medication Management: RN will administer medications as ordered by provider, will assess and evaluate patient's response and provide education to patient for prescribed medication. RN will report any adverse and/or side effects to prescribing provider.  Therapeutic Interventions: 1 on 1 counseling sessions, Psychoeducation, Medication administration, Evaluate responses to treatment, Monitor vital signs and CBGs as ordered, Perform/monitor CIWA, COWS, AIMS and Fall Risk screenings as ordered, Perform wound care treatments as ordered.  Evaluation of Outcomes: Adequate for Discharge   LCSW Treatment Plan for Primary Diagnosis: Substance induced  mood disorder (HCC) Long Term Goal(s): Safe transition to appropriate next level of care at discharge, Engage patient in therapeutic group addressing interpersonal concerns.  Short Term Goals: Engage patient in aftercare planning with referrals and resources, Increase social support, Increase ability to appropriately verbalize feelings, Identify triggers associated with mental health/substance abuse issues and Increase skills for wellness and  recovery  Therapeutic Interventions: Assess for all discharge needs, 1 to 1 time with Social worker, Explore available resources and support systems, Assess for adequacy in community support network, Educate family and significant other(s) on suicide prevention, Complete Psychosocial Assessment, Interpersonal group therapy.  Evaluation of Outcomes: Adequate for Discharge   Progress in Treatment: Attending groups: Yes. Participating in groups: Yes. Taking medication as prescribed: Yes. Toleration medication: Yes. Family/Significant other contact made: Yes, individual(s) contacted:  Mother Patient understands diagnosis: No. Discussing patient identified problems/goals with staff: Yes. Medical problems stabilized or resolved: Yes. Denies suicidal/homicidal ideation: Yes. Issues/concerns per patient self-inventory: No.  New problem(s) identified: No, Describe:  none  New Short Term/Long Term Goal(s):medication stabilization, elimination of SI thoughts, development of comprehensive mental wellness plan.   Patient Goals:  Pt did not attend.  Discharge Plan or Barriers: Patient will return home and will follow up with the [facility] and [facility]   Reason for Continuation of Hospitalization: Medication stabilization  Estimated Length of Stay: Adequate for discharge   Attendees: Patient: Did not attend 10/21/2020   Physician: Landry Mellow, MD 10/21/2020   Nursing:  10/21/2020   RN Care Manager: 10/21/2020   Social Worker: Melba Coon, LCSWA 10/21/2020   Recreational Therapist:  10/21/2020   Other:  10/21/2020  Other:  10/21/2020   Other: 10/21/2020    Scribe for Treatment Team: Aram Beecham, LCSWA 10/21/2020 10:52 AM

## 2020-10-21 NOTE — Progress Notes (Signed)
Recreation Therapy Notes  Date:  12.31.21 Time: 0930 Location: [address] Dayroom  Group Topic: Stress Management  Goal Area(s) Addresses:  Patient will identify positive stress management techniques. Patient will identify benefits of using stress management post d/c.  Intervention: Stress Management  Activity:  Meditation.  LRT played a meditation that focused on reflecting on the year that was while preparing for the year to come.  Patients were to follow along as the meditation played and think of the accomplishments they had and even if there were setbacks, find the lesson in it.    Education:  Stress Management, Discharge Planning.   Education Outcome: Acknowledges Education  Clinical Observations/Feedback: Pt did not attend group session.     Abbiegail Landgren, LRT/CTRS         Paisli Silfies A 10/21/2020 10:17 AM 

## 2020-10-21 NOTE — [facility] Group Notes (Signed)
[facility] LCSW Group Therapy  10/21/2020 2:24 PM  Type of Therapy:  Coping Skills and Discharge Planning  Participation Level:  Active  Participation Quality:  Appropriate, Sharing and Supportive  Affect:  Appropriate  Cognitive:  Appropriate  Insight:  Developing/Improving  Engagement in Therapy:  Developing/Improving  Modes of Intervention:  Activity and Discussion  Summary of Progress/Problems: Aquilla attended the group and remained there the entire time.  Holt shared that one positive thing about him is that he never gives up.  Fausto states that his family and church are his support system and what motivates him to keep going.  Naaman states that one of his coping skills is to take time to himself to think things through.  Auther did accept the handouts that were provided.   Met[place] Kung Kalyn Hofstra 10/21/2020, 2:24 PM

## 2020-10-21 NOTE — Progress Notes (Signed)
The patient rated his day as a 10 out of a possible 10. He states that he felt confused in the morning but is now feeling better. His goal for tomorrow is to feel even better than he did today.

## 2020-10-21 NOTE — Discharge Summary (Signed)
Physician Discharge Summary Note  Patient:  Clifford Clark is an 29 y.o., male MRN:  [id number] DOB:  Feb 18, 1991 Patient phone:  [phone] (home)  Patient address:   [address] [place] [address],   Total Time spent with patient: Greater than 30 minutes  Date of Admission:  10/16/2020  Date of Discharge: 10-21-20  Reason for Admission: Worsening acute psychosis & indecent exposure (Strpped himself naked at the hospital).  Principal Problem: Substance induced mood disorder [facility]) Discharge Diagnoses: Principal Problem:   Substance induced mood disorder (HCC) Active Problems:   MDD (major depressive disorder), recurrent episode, severe (HCC)   Past Psychiatric History: Major depressive disorder, Alcohol use disorder, Opioid use disorder.  Past Medical History:  Past Medical History:  Diagnosis Date  . Asthma   . CHF (congestive heart failure) (HCC)   . Opiate abuse, continuous (HCC)    Heroin    Past Surgical History:  Procedure Laterality Date  . APPLICATION OF ANGIOVAC N/A 05/10/2020   Procedure: APPLICATION OF ANGIOVAC;  Surgeon: [name], MD;  Location: [facility] OR;  Service: Vascular;  Laterality: N/A;  . BUBBLE STUDY  05/06/2020   Procedure: BUBBLE STUDY;  Surgeon: [name], MD;  Location: [facility] ENDOSCOPY;  Service: Cardiovascular;;  . I & D EXTREMITY Bilateral 05/03/2020   Procedure: BILATERAL IRRIGATION AND DEBRIDEMENT HANDS;  Surgeon: [name], MD;  Location: [facility] ORS;  Service: Orthopedics;  Laterality: Bilateral;  . MULTIPLE EXTRACTIONS WITH ALVEOLOPLASTY N/A 06/14/2020   Procedure: Surgical extraction of tooth #'s 3 and 28 with alveoloplasty and gross debridement of remaining dentition.;  Surgeon: [name], DDS;  Location: [facility] OR;  Service: Oral Surgery;  Laterality: N/A;  . TEE WITHOUT CARDIOVERSION N/A 05/06/2020   Procedure: TRANSESOPHAGEAL ECHOCARDIOGRAM (TEE);  Surgeon: [name], MD;  Location: [facility] ENDOSCOPY;  Service:  Cardiovascular;  Laterality: N/A;  . TEE WITHOUT CARDIOVERSION N/A 05/10/2020   Procedure: TRANSESOPHAGEAL ECHOCARDIOGRAM (TEE);  Surgeon: [name], MD;  Location: [facility] OR;  Service: Thoracic;  Laterality: N/A;   Family History: History reviewed. No pertinent family history.  Family Psychiatric  History: See H&P  Social History:  Social History   Substance and Sexual Activity  Alcohol Use Not Currently   Comment: rarely     Social History   Substance and Sexual Activity  Drug Use Not Currently  . Types: Marijuana, IV, "Crack" cocaine, Cocaine, Fentanyl, Methamphetamines, Amphetamines   Comment: Heroin    Social History   Socioeconomic History  . Marital status: Single    Spouse name: Not on file  . Number of children: Not on file  . Years of education: Not on file  . Highest education level: Not on file  Occupational History  . Not on file  Tobacco Use  . Smoking status: Former Smoker    Packs/day: 0.50  . Smokeless tobacco: Never Used  Vaping Use  . Vaping Use: Never used  Substance and Sexual Activity  . Alcohol use: Not Currently    Comment: rarely  . Drug use: Not Currently    Types: Marijuana, IV, "Crack" cocaine, Cocaine, Fentanyl, Methamphetamines, Amphetamines    Comment: Heroin  . Sexual activity: Yes  Other Topics Concern  . Not on file  Social History Narrative  . Not on file   Social Determinants of Health   Financial Resource Strain: Not on file  Food Insecurity: Not on file  Transportation Needs: Not on file  Physical Activity: Not on file  Stress:  Not on file  Social Connections: Not on file   Hospital Course: (Per Md's admission evaluation notes): Patient is a 29 year old male who originally presented to the [facility] emergency department on 10/16/2020 accompanied by his mother. The patient is acutely psychotic on my examination. Upon arrival on the hospital this morning he was stripped naked, walking up and down  the hallway, loud, abusive and threatening. Most of his history is collected from previous notes. He had initially presented to the [facility] behavioral health hospital and then was transferred to the [facility] emergency department for medical clearance. The patient reported that he had been using methamphetamines. He appeared to be impaired at that time. He was disorganized. He stated at that time that he was desperate to "calm down". He had been tricked into taking methamphetamines and possibly other substances. His drug screen on admission was positive for opiates but otherwise negative. Review of the electronic medical record revealed that he has a history of endocarditis and a history of opiate dependence. He had been admitted to the medical hospital in July 2021 after having been found down on the side of the road. He had multiple septic emboli to the lungs with cavitary lesions. 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 There was also emboli found to the kidneys and hands. He underwent an AngioJet removal of part of the vegetation. He developed an MRSA sepsis and respiratory failure. He was intubated at that time and was started on pressors. His hospital course at that time was complicated by respiratory failure as well as septic shock. He completed IV antibiotics prior to discharge. 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 It does appear that he has had his valve replaced. Unfortunately this morning upon evaluation the patient is currently psychotic. He appears to be either intoxicated or delirious from his drug use. He required seclusion, Geodon 20 mg IM x1 which did not resolve his agitation, and then required Haldol 10/Ativan 2/Benadryl 50 to initiate a calming response. He is currently in seclusion. Review of the electronic medical record reveals the fact that it does appear that he was last  psychiatrically hospitalized to [facility] in March 2020. It was for opiate dependence at that time. He had also intermittently used Xanax at that time. It looks like that was just an emergency room visit, and he was not admitted to the psychiatric unit. He was discharged on dicyclomine, naloxone and promethazine. His last vital signs in the chart were from 2300 on 12/26. His blood pressure was stable. His heart rate was normal. He was afebrile. Pulse oximetry revealed his oxygen level to be 96% on room air. Review of his electronic medical record laboratories on admission revealed a mildly elevated glucose at 102. His AST is elevated at 69 and his ALT is elevated at 215. Unfortunately we do not have any other laboratories in the electronic medical record. His CBC was essentially normal except for a mildly elevated platelet count at 402,000. His differential was normal. Blood alcohol on admission was less than 10, drug screen was positive for amphetamines. He is apparently on probation for unspecified reasons.  This isthesecondadmission/discharge summary for this86year old Caucasian male from this BHhospital . He was a patient in this hospital from April 4th to April 8 of 2020. He was then treated for substance abuse related issues.He was discharged to the [facility] residential treatment center [facility]  [place] further substance abuse treatments. Trelon hasprevious hx of mental illness,polysubstancesubstance use issues & multiple psychiatric admissions in other hospitals within the area.He was brought to the hospital this time around for worsening psychosis. And while at the hospital, Richad apparently stripped himself naked, walking up & down the hallway, loud, abusive & threatening. He was in need of mood stabilization treatments.  After evaluation of hispresenting symptoms, it was jointly agreed by the treatment team torecommend Jakefor mood stabilization as well as  detoxification treatments. The medication regimenfor his presenting symptomswere discussed &initiatedwith hisconsent. Hewas treated, stabilized & discharged on themedicationsas listed below on his discharge medication lists. He was also enrolled & participated in the group counseling sessions being offered & held on this unit.He learned coping skills.He presented other significant pres-exidting medical issues that required treatment & or monitoring.He was resumed & discharged on all hispertinent home medications for those health issues. He tolerated histreatment regimen without any adverse effects or reactions reported.   Elad's symptoms responded well to histreatment regimen. This is evidenced by hisreports of improved mood, resolution of symptoms &presentation of good affect/eye contact.Heiscurrently mentally & medically stable for dischargeto continue mental health/medical care& further substance abuse treatment/counseling services as recommended below.   Today upon discharge evaluationwith the attending psychiatrist today,Jakeshares he is doing better.He denies any specific concerns.He is sleeping well. Hisappetite is good. He denies other physical complaints.He denies SI/HI/AH/VH.He is tolerating hismedications well &in agreement to continue hiscurrent regimen as noted on the discharge medication lists below.He will have follow up for routine psychiatric care, further substance abuse treatment&medication management as noted below. He is provided with all the necessary information needed to make these appointments without problems. He was able to engage in safety planning including plan to return to [facility] or contact emergency services if he feels unable to maintain hisown safety or the safety of others. Pt had no further questions, comments or concerns. He left [facility] with all personal belongings in no apparent distress.Transportation per his family (mother).  Physical  Findings: AIMS: Facial and Oral Movements Muscles of Facial Expression: None, normal Lips and Perioral Area: None, normal Jaw: None, normal Tongue: None, normal,Extremity Movements Upper (arms, wrists, hands, fingers): None, normal Lower (legs, knees, ankles, toes): None, normal, Trunk Movements Neck, shoulders, hips: None, normal, Overall Severity Severity of abnormal movements (highest score from questions above): None, normal Incapacitation due to abnormal movements: None, normal Patient's awareness of abnormal movements (rate only patient's report): No Awareness, Dental Status Current problems with teeth and/or dentures?: No Does patient usually wear dentures?: No  CIWA:    COWS:     Musculoskeletal: Strength & Muscle Tone: within normal limits Gait & Station: normal Patient leans: N/A  Psychiatric Specialty Exam: Physical Exam Vitals and nursing note reviewed.  Constitutional:      Appearance: He is well-developed.  HENT:     Head: Normocephalic.     Nose: Nose normal.     Mouth/Throat:     Pharynx: Oropharynx is clear.  Eyes:     Pupils: Pupils are equal, round, and reactive to light.  Cardiovascular:     Rate and Rhythm: Normal rate.  Pulmonary:     Effort: Pulmonary effort is normal.     Breath sounds: Normal breath sounds.  Abdominal:     Palpations: Abdomen is soft.  Genitourinary:    Comments: Deferred Musculoskeletal:        General: Normal range of motion.     Cervical back: Normal range of motion.  Skin:    General: Skin is warm and dry.  Neurological:     General: No focal deficit present.     Mental Status: He is alert and oriented to person, place, and time.     Review of Systems  Constitutional: Negative.  Negative for chills, diaphoresis, fever and malaise/fatigue.  HENT: Negative for congestion and sore throat.   Eyes: Negative for blurred vision.  Respiratory: Negative for cough, shortness of breath and wheezing.   Cardiovascular:  Negative for chest pain and palpitations.  Gastrointestinal: Negative for constipation, diarrhea, heartburn, nausea and vomiting.  Genitourinary: Negative for dysuria.  Musculoskeletal: Negative for joint pain and myalgias.  Skin: Negative.   Neurological: Negative for dizziness, tingling, tremors, sensory change, speech change, focal weakness, seizures, loss of consciousness, weakness and headaches.  Endo/Heme/Allergies: Negative for environmental allergies and polydipsia. Does not bruise/bleed easily.       Allergies: NKDA  Psychiatric/Behavioral: Positive for depression (Stabilized with medication prior to discharge) and substance abuse (heroin). Negative for hallucinations, memory loss and suicidal ideas. The patient is nervous/anxious. The patient does not have insomnia.     Blood pressure (!) 95/55, pulse 86, temperature 98 F (36.7 C), temperature source Oral, resp. rate 16, height 5\' 7"  (1.702 m), SpO2 100 %.Body mass index is 24.75 kg/m.  See Md's discharge SRA  Sleep:  Number of Hours: 6.75   Have you used any form of tobacco in the last 30 days? (Cigarettes, Smokeless Tobacco, Cigars, and/or Pipes): Patient Refused Screening  Has this patient used any form of tobacco in the last 30 days? (Cigarettes, Smokeless Tobacco, Cigars, and/or Pipes): Yes, an FDA-approved medication for tobacco cessation was recommended at discharge.   Blood Alcohol level:  Lab Results  Component Value Date   ETH <10 10/16/2020   ETH <10 04/28/2020   Metabolic Disorder Labs:  Lab Results  Component Value Date   HGBA1C 5.2 10/17/2020   MPG 102.54 10/17/2020   No results found for: PROLACTIN Lab Results  Component Value Date   CHOL 177 10/17/2020   TRIG 75 10/17/2020   HDL 45 10/17/2020   CHOLHDL 3.9 10/17/2020   VLDL 15 10/17/2020   LDLCALC 117 (H) 10/17/2020   LDLCALC 88 05/06/2019   See Psychiatric Specialty Exam and Suicide Risk Assessment completed by Attending Physician prior to  discharge.  Discharge destination:  Home  Is patient on multiple antipsychotic therapies at discharge:  No   Has Patient had three or more failed trials of antipsychotic monotherapy by history:  No  Recommended Plan for Multiple Antipsychotic Therapies: NA Discharge Instructions    Diet - low sodium heart healthy   Complete by: As directed    Increase activity slowly   Complete by: As directed      Allergies as of 10/21/2020   No Known Allergies     Medication List    STOP taking these medications   amoxicillin 500 MG capsule Commonly known as: AMOXIL   carvedilol 6.25 MG tablet Commonly known as: COREG   clonazePAM 0.5 MG tablet Commonly known as: KLONOPIN   gabapentin 600 MG tablet Commonly known as: NEURONTIN Replaced by: gabapentin 300 MG capsule   sertraline 50 MG tablet Commonly known as: ZOLOFT   spironolactone 25 MG tablet Commonly known as: ALDACTONE     TAKE these medications     Indication  Entresto 49-51 MG Generic drug: sacubitril-valsartan Take 1 tablet by mouth 2 (two) times daily.  Indication: Cardiac Failure   gabapentin 300  MG capsule Commonly known as: NEURONTIN Take 1 capsule (300 mg total) by mouth 3 (three) times daily. For agitation Replaces: gabapentin 600 MG tablet  Indication: Abuse or Misuse of Alcohol, Agitation   hydrOXYzine 25 MG tablet Commonly known as: ATARAX/VISTARIL Take 1 tablet (25 mg total) by mouth 3 (three) times daily as needed for anxiety.  Indication: Feeling Anxious   ivabradine 5 MG Tabs tablet Commonly known as: CORLANOR Take 1 tablet (5 mg total) by mouth 2 (two) times daily with a meal.  Indication: Cardiac condition   OLANZapine zydis 5 MG disintegrating tablet Commonly known as: ZYPREXA Take 1 tablet (5 mg total) by mouth at bedtime. For mood control  Indication: Mood control   traZODone 50 MG tablet Commonly known as: DESYREL Take 1 tablet (50 mg total) by mouth at bedtime as needed for  sleep.  Indication: Trouble Sleeping       Follow-up [facility]. Go on 10/24/2020.   Specialty: Behavioral Health Why: You may return to morning/day group on Monday 10/25/19 at 10:30am.  Contact information: [address] [place] [address] [phone]        [facility] Go on 11/15/2020.   Why: You have an appointment for medication management services on 11/15/20 at 10:50 am.  This appointment will be held in person. Contact information: [address] [place] [address] [phone]        Trey SailorsPa, [facility] Follow up.   Specialty: Family Medicine Contact information: [address] [place] [address] [phone]        Alvia Groveidge, [facility]. Go to.   Why: Next for appt to check yr liver functions Contact information: [address] [place] [address] 510 [phone]              Follow-up recommendations: Activity:  As tolerated Diet: As recommended by your primary care doctor. Keep all scheduled follow-up appointments as recommended.  Comments: Prescriptions given at discharge.  Patient agreeable to plan.  Given opportunity to ask questions.  Appears to feel comfortable with discharge denies any current suicidal or homicidal thought. Patient is also instructed prior to discharge to: Take all medications as prescribed by his/her mental healthcare provider. Report any adverse effects and or reactions from the medicines to his/her outpatient provider promptly. Patient has been instructed & cautioned: To not engage in alcohol and or illegal drug use while on prescription medicines. In the event of worsening symptoms, patient is instructed to call the crisis hotline, 911 and or go to the nearest ED for appropriate evaluation and treatment of symptoms. To follow-up with his/her primary care provider for your other medical issues, concerns and or health care  needs.     Signed: Armandina StammerAgnes Adamarie Izzo, NP, PMHNP, FNP-BC 10/21/2020, 2:23 PM

## 2020-10-21 NOTE — BHH Suicide Risk Assessment (Signed)
[facility] Discharge Suicide Risk Assessment   Principal Problem: Substance induced mood disorder (HCC) Discharge Diagnoses: Principal Problem:   Substance induced mood disorder (HCC) Active Problems:   MDD (major depressive disorder), recurrent episode, severe (HCC)   Total Time spent with patient: 20 minutes  Musculoskeletal: Strength & Muscle Tone: within normal limits Gait & Station: normal Patient leans: N/A  Psychiatric Specialty Exam: Review of Systems  All other systems reviewed and are negative.   Blood pressure (!) 95/55, pulse 86, temperature 98 F (36.7 C), temperature source Oral, resp. rate 16, height 5\' 7"  (1.702 m), SpO2 100 %.Body mass index is 24.75 kg/m.  General Appearance: Casual  Eye Contact::  Good  Speech:  Normal Rate409  Volume:  Normal  Mood:  Euthymic  Affect:  Congruent  Thought Process:  Coherent and Descriptions of Associations: Intact  Orientation:  Full (Time, Place, and Person)  Thought Content:  Logical  Suicidal Thoughts:  No  Homicidal Thoughts:  No  Memory:  Immediate;   Good Recent;   Good Remote;   Good  Judgement:  Intact  Insight:  Fair  Psychomotor Activity:  Normal  Concentration:  Good  Recall:  Good  Fund of Knowledge:Good  Language: Good  Akathisia:  Negative  Handed:  Right  AIMS (if indicated):     Assets:  Desire for Improvement Housing Resilience Social Support  Sleep:  Number of Hours: 6.75  Cognition: WNL  ADL's:  Intact   Mental Status Per Nursing Assessment::   On Admission:  Suicidal ideation indicated by others,Suicide plan,Self-harm thoughts  Demographic Factors:  Male, Caucasian, Low socioeconomic status and Unemployed  Loss Factors: NA  Historical Factors: Impulsivity  Risk Reduction Factors:   Living with another person, especially a relative and Positive social support  Continued Clinical Symptoms:  Alcohol/Substance Abuse/Dependencies  Cognitive Features That Contribute To Risk:  None     Suicide Risk:  Minimal: No identifiable suicidal ideation.  Patients presenting with no risk factors but with morbid ruminations; may be classified as minimal risk based on the severity of the depressive symptoms   Follow-up Information    [facility]. Go on 10/24/2020.   Specialty: Behavioral Health Why: You may return to morning/day group on Monday 10/25/19 at 10:30am.  Contact information: [address] [place] [phone]        [facility] Go on 11/15/2020.   Why: You have an appointment for medication management services on 11/15/20 at 10:50 am.  This appointment will be held in person. Contact information: [address] [place] [phone]               Plan Of Care/Follow-up recommendations:  Activity:  Ad lib. Tests:  Be sure to contact your primary care provider early next week to recheck your kidney function as well as your liver function enzymes.  Hold your Aldactone until after these tests are done. Other:  Be sure and contact your primary care provider early next week to have your liver function enzymes and kidney function checked.  Hold your Aldactone until after those test are checked.  Do not use drugs or alcohol.  Follow-up with your cardiologist as well.  [phone], MD 10/21/2020, 10:56 AM

## 2020-10-21 NOTE — Progress Notes (Signed)
RN met with pt and reviewed pt's discharge instructions.  Pt verbalized understanding of discharge instructions and pt did not have any questions. RN reviewed and provided pt with a copy of SRA, AVS and Transition Record.  RN returned pt's belongings to pt.  Pt denied SI/HI/AVH and voiced no concerns.  Pt was appreciative of the care pt received at [facility].  Patient discharged to the lobby where pt's mom was waiting on pt.

## 2020-10-21 NOTE — Progress Notes (Signed)
  [facility] Adult Case Management Discharge Plan :  Will you be returning to the same living situation after discharge:  Yes,  with mother At discharge, do you have transportation home?: Yes,  via mom Do you have the ability to pay for your medications: No.  Release of information consent forms completed and in the chart;  Patient's signature needed at discharge.  Patient to Follow up at:  Follow-up Information    Inc[facility]. Go on 10/24/2020.   Specialty: Behavioral Health Why: You may return to morning/day group on Monday 10/25/19 at 10:30am.  Contact information: [address] [place] [address] [phone]        [facility] Go on 11/15/2020.   Why: You have an appointment for medication management services on 11/15/20 at 10:50 am.  This appointment will be held in person. Contact information: [address] [place] [address] [phone]               Next level of care provider has access to [facility] Link:no  Safety Planning and Suicide Prevention discussed: Yes,  mother Alyson Ingles [phone])  Have you used any form of tobacco in the last 30 days? (Cigarettes, Smokeless Tobacco, Cigars, and/or Pipes): Patient Refused Screening  Has patient been referred to the Quitline?: Patient refused referral  Patient has been referred for addiction treatment: Pt. refused referral  Felizardo Hoffmann, Theresia Majors 10/21/2020, 9:52 AM

## 2020-10-24 ENCOUNTER — Other Ambulatory Visit (HOSPITAL_COMMUNITY): Payer: Self-pay | Admitting: Adult Health

## 2020-10-28 ENCOUNTER — Other Ambulatory Visit (HOSPITAL_COMMUNITY): Payer: Self-pay | Admitting: Adult Health

## 2020-11-01 ENCOUNTER — Other Ambulatory Visit (HOSPITAL_COMMUNITY): Payer: Self-pay | Admitting: Internal Medicine

## 2020-11-01 ENCOUNTER — Other Ambulatory Visit (HOSPITAL_COMMUNITY): Payer: Self-pay | Admitting: Adult Health

## 2020-11-01 MED FILL — CARVEDILOL 6.25 MG TABLET: 6.25 | 30 days supply | Qty: 60 | Fill #0

## 2020-11-07 ENCOUNTER — Ambulatory Visit (HOSPITAL_COMMUNITY): Payer: Self-pay

## 2020-11-07 ENCOUNTER — Encounter (HOSPITAL_COMMUNITY): Payer: Self-pay | Admitting: Internal Medicine

## 2020-12-12 ENCOUNTER — Ambulatory Visit (HOSPITAL_BASED_OUTPATIENT_CLINIC_OR_DEPARTMENT_OTHER)
Admission: RE | Admit: 2020-12-12 | Discharge: 2020-12-12 | Disposition: A | Discharge status: Home / Self Care | Payer: Self-pay | Source: Ambulatory Visit | Attending: Internal Medicine | Admitting: Internal Medicine

## 2020-12-12 ENCOUNTER — Encounter (HOSPITAL_COMMUNITY): Payer: Self-pay | Admitting: Internal Medicine

## 2020-12-12 ENCOUNTER — Ambulatory Visit (HOSPITAL_COMMUNITY)
Admission: RE | Admit: 2020-12-12 | Discharge: 2020-12-12 | Disposition: A | Discharge status: Home / Self Care | Payer: Self-pay | Source: Ambulatory Visit | Attending: Internal Medicine | Admitting: Internal Medicine

## 2020-12-12 ENCOUNTER — Other Ambulatory Visit: Payer: Self-pay

## 2020-12-12 VITALS — BP 120/78 | HR 88 | Wt 150.8 lb

## 2020-12-12 DIAGNOSIS — I2690 Septic pulmonary embolism without acute cor pulmonale: Secondary | ICD-10-CM | POA: Insufficient documentation

## 2020-12-12 DIAGNOSIS — Z79899 Other long term (current) drug therapy: Secondary | ICD-10-CM | POA: Insufficient documentation

## 2020-12-12 DIAGNOSIS — I5022 Chronic systolic (congestive) heart failure: Secondary | ICD-10-CM | POA: Insufficient documentation

## 2020-12-12 DIAGNOSIS — I071 Rheumatic tricuspid insufficiency: Secondary | ICD-10-CM

## 2020-12-12 DIAGNOSIS — R000 Tachycardia, unspecified: Secondary | ICD-10-CM | POA: Insufficient documentation

## 2020-12-12 DIAGNOSIS — F419 Anxiety disorder, unspecified: Secondary | ICD-10-CM | POA: Insufficient documentation

## 2020-12-12 DIAGNOSIS — I082 Rheumatic disorders of both aortic and tricuspid valves: Secondary | ICD-10-CM | POA: Insufficient documentation

## 2020-12-12 DIAGNOSIS — F1911 Other psychoactive substance abuse, in remission: Secondary | ICD-10-CM | POA: Insufficient documentation

## 2020-12-12 DIAGNOSIS — Z87891 Personal history of nicotine dependence: Secondary | ICD-10-CM | POA: Insufficient documentation

## 2020-12-12 DIAGNOSIS — I079 Rheumatic tricuspid valve disease, unspecified: Secondary | ICD-10-CM

## 2020-12-12 LAB — ECHOCARDIOGRAM COMPLETE: S' Lateral: 3.1 cm

## 2020-12-12 MED ORDER — AMOXICILLIN 500 MG PO TABS: 2000.0000 mg | ORAL_TABLET | ORAL | 3 refills | Status: DC

## 2020-12-12 NOTE — Patient Instructions (Signed)
Please call our office in August 2022 to schedule your follow up appointment and echocardiogram  Your physician has requested that you have an echocardiogram. Echocardiography is a painless test that uses sound waves to create images of your heart. It provides your doctor with information about the size and shape of your heart and how well your heart's chambers and valves are working. This procedure takes approximately one hour. There are no restrictions for this procedure.   If you have any questions or concerns before your next appointment please send us a message through mychart or call our office at [phone].    TO LEAVE A MESSAGE FOR THE NURSE SELECT OPTION 2, PLEASE LEAVE A MESSAGE INCLUDING: . YOUR NAME . DATE OF BIRTH . CALL BACK NUMBER . REASON FOR CALL**this is important as we prioritize the call backs  YOU WILL RECEIVE A CALL BACK THE SAME DAY AS LONG AS YOU CALL BEFORE 4:00 PM  

## 2020-12-12 NOTE — Addendum Note (Signed)
Encounter addended by: Dolores Patty, MD on: 12/12/2020 3:12 PM  Actions taken: Level of Service modified, Visit diagnoses modified
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
## 2020-12-12 NOTE — Progress Notes (Signed)
Echocardiogram 2D Echocardiogram has been performed.  Warren Lacy Allyah Heather 12/12/2020, 1:57 PM

## 2020-12-21 ENCOUNTER — Encounter (HOSPITAL_COMMUNITY): Payer: Self-pay | Admitting: Internal Medicine

## 2020-12-21 ENCOUNTER — Other Ambulatory Visit (HOSPITAL_COMMUNITY): Payer: Self-pay

## 2021-01-09 ENCOUNTER — Other Ambulatory Visit (HOSPITAL_COMMUNITY): Payer: Self-pay

## 2021-02-25 ENCOUNTER — Encounter: Payer: Self-pay | Admitting: Family Medicine

## 2021-03-09 ENCOUNTER — Other Ambulatory Visit: Payer: Self-pay

## 2021-03-09 ENCOUNTER — Emergency Department (HOSPITAL_COMMUNITY): Payer: Self-pay

## 2021-03-09 ENCOUNTER — Emergency Department (HOSPITAL_COMMUNITY)
Admission: EM | Admit: 2021-03-09 | Discharge: 2021-03-10 | Disposition: A | Discharge status: Home / Self Care | Payer: Self-pay | Attending: Emergency Medicine | Admitting: Emergency Medicine

## 2021-03-09 ENCOUNTER — Encounter (HOSPITAL_COMMUNITY): Payer: Self-pay | Admitting: Emergency Medicine

## 2021-03-09 DIAGNOSIS — X838XXA Intentional self-harm by other specified means, initial encounter: Secondary | ICD-10-CM | POA: Insufficient documentation

## 2021-03-09 DIAGNOSIS — I959 Hypotension, unspecified: Secondary | ICD-10-CM | POA: Insufficient documentation

## 2021-03-09 DIAGNOSIS — T402X4A Poisoning by other opioids, undetermined, initial encounter: Secondary | ICD-10-CM

## 2021-03-09 DIAGNOSIS — F331 Major depressive disorder, recurrent, moderate: Secondary | ICD-10-CM | POA: Insufficient documentation

## 2021-03-09 DIAGNOSIS — T402X2A Poisoning by other opioids, intentional self-harm, initial encounter: Secondary | ICD-10-CM | POA: Insufficient documentation

## 2021-03-09 DIAGNOSIS — T50901A Poisoning by unspecified drugs, medicaments and biological substances, accidental (unintentional), initial encounter: Secondary | ICD-10-CM | POA: Diagnosis present

## 2021-03-09 DIAGNOSIS — Z20822 Contact with and (suspected) exposure to covid-19: Secondary | ICD-10-CM | POA: Insufficient documentation

## 2021-03-09 DIAGNOSIS — J45909 Unspecified asthma, uncomplicated: Secondary | ICD-10-CM | POA: Insufficient documentation

## 2021-03-09 DIAGNOSIS — I5021 Acute systolic (congestive) heart failure: Secondary | ICD-10-CM | POA: Insufficient documentation

## 2021-03-09 DIAGNOSIS — F1110 Opioid abuse, uncomplicated: Secondary | ICD-10-CM | POA: Diagnosis present

## 2021-03-09 DIAGNOSIS — T50904A Poisoning by unspecified drugs, medicaments and biological substances, undetermined, initial encounter: Secondary | ICD-10-CM

## 2021-03-09 DIAGNOSIS — F332 Major depressive disorder, recurrent severe without psychotic features: Secondary | ICD-10-CM | POA: Insufficient documentation

## 2021-03-09 DIAGNOSIS — Z87891 Personal history of nicotine dependence: Secondary | ICD-10-CM | POA: Insufficient documentation

## 2021-03-09 DIAGNOSIS — F99 Mental disorder, not otherwise specified: Secondary | ICD-10-CM

## 2021-03-09 LAB — CBC WITH DIFFERENTIAL/PLATELET
Abs Immature Granulocytes: 0.04 10*3/uL (ref 0.00–0.07)
Basophils Absolute: 0.1 10*3/uL (ref 0.0–0.1)
Basophils Relative: 1 %
Eosinophils Absolute: 0.3 10*3/uL (ref 0.0–0.5)
Eosinophils Relative: 4 %
HCT: 47.3 % (ref 39.0–52.0)
Hemoglobin: 16.4 g/dL (ref 13.0–17.0)
Immature Granulocytes: 1 %
Lymphocytes Relative: 30 %
Lymphs Abs: 2.3 10*3/uL (ref 0.7–4.0)
MCH: 30.3 pg (ref 26.0–34.0)
MCHC: 34.7 g/dL (ref 30.0–36.0)
MCV: 87.3 fL (ref 80.0–100.0)
Monocytes Absolute: 0.9 10*3/uL (ref 0.1–1.0)
Monocytes Relative: 12 %
Neutro Abs: 4 10*3/uL (ref 1.7–7.7)
Neutrophils Relative %: 52 %
Platelets: 336 10*3/uL (ref 150–400)
RBC: 5.42 MIL/uL (ref 4.22–5.81)
RDW: 12.4 % (ref 11.5–15.5)
WBC: 7.6 10*3/uL (ref 4.0–10.5)
nRBC: 0 % (ref 0.0–0.2)

## 2021-03-09 LAB — RESP PANEL BY RT-PCR (FLU A&B, COVID) ARPGX2
Influenza A by PCR: NEGATIVE
Influenza B by PCR: NEGATIVE
SARS Coronavirus 2 by RT PCR: NEGATIVE

## 2021-03-09 LAB — BASIC METABOLIC PANEL
Anion gap: 8 (ref 5–15)
BUN: 24 mg/dL — ABNORMAL HIGH (ref 6–20)
CO2: 26 mmol/L (ref 22–32)
Calcium: 9 mg/dL (ref 8.9–10.3)
Chloride: 102 mmol/L (ref 98–111)
Creatinine, Ser: 1.28 mg/dL — ABNORMAL HIGH (ref 0.61–1.24)
GFR, Estimated: >60 mL/min (ref >60)
Glucose, Bld: 79 mg/dL (ref 70–99)
Potassium: 3.4 mmol/L — ABNORMAL LOW (ref 3.5–5.1)
Sodium: 136 mmol/L (ref 135–145)

## 2021-03-09 LAB — TROPONIN I (HIGH SENSITIVITY): Troponin I (High Sensitivity): 3 ng/L (ref <18)

## 2021-03-09 LAB — ETHANOL: Alcohol, Ethyl (B): <10 mg/dL (ref <10)

## 2021-03-09 MED ORDER — SODIUM CHLORIDE 0.9 % IV BOLUS
1000.0000 mL | Freq: Once | INTRAVENOUS | Status: AC
  Administered 2021-03-09: 1000 mL via INTRAVENOUS

## 2021-03-09 MED ORDER — NALOXONE HCL 0.4 MG/ML IJ SOLN
0.4000 mg | Freq: Once | INTRAMUSCULAR | Status: DC
  Filled 2021-03-09: qty 1

## 2021-03-09 NOTE — ED Triage Notes (Signed)
EMS was called due to respiratory arrest. Once they arrived on the scene the patient was alert and talking though short of breath, nauseous and weak. Prior to their arrival, 2-4 mg (unknown amount) of narcan was given internasal by the sheriff department. The sheriff department also had to VBM. EMS noted hypotension (86/44) and tachycardic. 4 mg  Zofran and 500 cc of fluid was administered. The reportedly took fentanyl.    EMS vitals: 94/62 BP 88 HR 22 Resp Rate 97% O2 sat on room air

## 2021-03-09 NOTE — ED Notes (Signed)
Patient IVC'd (petition) by mother, First Exam by Anderson Malta.

## 2021-03-09 NOTE — ED Notes (Signed)
Provider at bedside

## 2021-03-09 NOTE — ED Notes (Signed)
Family at bedside. 
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
## 2021-03-09 NOTE — ED Notes (Signed)
Brought pt food and drink

## 2021-03-09 NOTE — ED Notes (Signed)
Pt is hypotensive, sbp in 80s, provider made aware

## 2021-03-09 NOTE — ED Notes (Addendum)
Patient is resting comfortably. 
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
## 2021-03-09 NOTE — ED Notes (Signed)
Pt refused narcan, provider aware

## 2021-03-09 NOTE — ED Notes (Signed)
Patient denies pain and is resting comfortably.  

## 2021-03-09 NOTE — ED Provider Notes (Signed)
Patient signed out to me by previous provider.  Please refer to their note for full HPI.  Briefly this is a 30 year old male who came in with assumed opiate overdose.  Responded to Narcan.  At time of signout patient was somnolent, second dose of Narcan was ordered.  He has also had noted hypotension, IV fluids being given. Physical Exam  BP (!) 96/47 (BP Location: Right Arm)   Pulse 75   Temp 97.8 F (36.6 C) (Oral)   Resp 19   Ht 5\' 7"  (1.702 m)   Wt 68 kg   SpO2 96%   BMI 23.49 kg/m   Physical Exam  ED Course/Procedures   Clinical Course as of 03/09/21 1903  Thu Mar 09, 2021  1354 Patient remains hypotensive, but continues to be quite somnolent.  He is easily arousable to voice.  I suspect that this hypotension may be baseline for him, with prior medical review showing blood pressures of 90-100 mmhg systolic while awake.  We will continue to observe him closely.  There is a mild elevation in BUN and creatinine, so have given him some fluids.  He is getting a second liter now.  His white blood cell count is also normal.  I have a much lower suspicion for acute endocarditis or bacteremia.  Doubt CHF exacerbation clinically. [MT]  1605 Patient signed out to Dr 1606. Sarkis Rhines pending medical reassessment of BP as opioids wear off.  His mother is here - history updated - patient now under IVC per additional hx from mother.  IVC filed by mother and upheld by myself here. [MT]    Clinical Course User Index [MT] Trifan, Kirtland Bouchard, MD    Procedures  MDM  Patient refused second dose of Narcan.  On my evaluation he is sitting up, alert and oriented, cooperative.  Does not appear drowsy enough to warrant Narcan administration.  We will hold dose.  Patient was allowed to eat and drink he was given more fluids, his blood pressure normalized around 96/47.  Patient admits that he has a blood pressure on the lower side in general.  He is sitting up, shows no complaints.  Appears to be medically cleared for  behavioral health evaluation.  Patient will be changed to provider Default.  He is under IVC.     Kermit Balo, DO 03/09/21 1904

## 2021-03-10 DIAGNOSIS — F1110 Opioid abuse, uncomplicated: Secondary | ICD-10-CM | POA: Diagnosis present

## 2021-03-10 NOTE — BH Assessment (Addendum)
BHH Assessment Progress Note  Per Nelly Rout, MD, this pt does not require psychiatric hospitalization at this time.  Pt presents under IVC initiated by pt's mother and upheld by EDP Alvester Chou, MD, which has been rescinded by Dr Lucianne Muss.  Pt is psychiatrically cleared.  Discharge instructions advise pt to continue treatment with Neila Gear, MD, pt's current outpatient provider.  EDP Arby Barrette, MD and pt's nurse, [place], have been notified.  Doylene Canning, MA Triage Specialist [phone]

## 2021-03-10 NOTE — ED Notes (Signed)
Mom wants an update from the nurse. [place], RN said she will call her back in 15 minutes.

## 2021-03-10 NOTE — ED Notes (Signed)
Patient has a breakfast tray, provided with a phone to call a ride.

## 2021-03-10 NOTE — ED Notes (Signed)
Mother called and updated on pt's status, she will be able to pick up patient and bring him some clean clothes around noon.

## 2021-03-10 NOTE — ED Notes (Signed)
Mother brought shoes and short, discharge papers explained to mother and patient.

## 2021-03-10 NOTE — Discharge Instructions (Signed)
1.  Use the resource guide in your discharge instructions to find a treatment program for opioid abuse.  You are at continuous risk for overdose and death if you continue to use these drugs.  Also, make a follow-up appointment with your family physician for routine medical care and for help with finding treatment programs.  2. For your behavioral health needs, you are advised to continue treatment with Neila Gear, MD:       Neila Gear, MD      [facility]      [address]      [place], [place] [address]      [phone]

## 2021-03-10 NOTE — ED Provider Notes (Signed)
Emergency Medicine Observation Re-evaluation Note  Clifford Clark is a 30 y.o. male, seen on rounds today.  Pt initially presented to the ED for complaints of Drug Overdose, Shortness of Breath, Weakness, and Nausea Currently, the patient is cleared by psychiatry for discharge.  Patient has no complaints.   Physical Exam  BP (!) 95/57   Pulse 69   Temp 97.8 F (36.6 C) (Oral)   Resp 19   Ht 5\' 7"  (1.702 m)   Wt 68 kg   SpO2 98%   BMI 23.49 kg/m  Physical Exam Patient is alert with clear mental status.  No respiratory distress.  Color is good.  Movements are coordinated purposeful symmetric.  ED Course / MDM  EKG:EKG Interpretation  Date/Time:  Thursday Mar 09 2021 14:00:53 EDT Ventricular Rate:  73 PR Interval:  128 QRS Duration: 110 QT Interval:  411 QTC Calculation: 453 R Axis:   -58 Text Interpretation: Sinus rhythm Incomplete RBBB and LAFB RSR' in V1 or V2, right VCD or RVH No significant change from Oct 19 2020 ecg, no STEMI Confirmed by [id number] [phone]) on 03/09/2021 2:15:56 PM   I have reviewed the labs performed to date as well as medications administered while in observation.  Recent changes in the last 24 hours include medically cleared by psychiatry.  Plan  Current plan is for discharge.  I have rescinded IVC as patient is now psychiatrically cleared.  Patient has been stable and has been in the ED for 20 hours.  Upon my reassessment his mental status is clear no signs of respiratory distress.  I have included resource guide for outpatient treatment.   03/11/2021, MD 03/10/21 [phone]

## 2021-03-10 NOTE — Consult Note (Signed)
Telepsych Consultation   Reason for Consult:  Psych consult Referring Physician:  Coralee Pesa Location of Patient: [facility] Location of Provider: [facility]  Patient Identification: Merit Maybee MRN:  [id number] Principal Diagnosis: Drug overdose Diagnosis:  Principal Problem:   Drug overdose Active Problems:   Opiate abuse, continuous (HCC)   Total Time spent with patient: 20 minutes  Subjective:   Denman Pichardo is a 30 y.o. male patient admitted with overdose.  Patient presents alert and oriented; cooperative, sitting on stretcher. Alert and oriented to person, place, situation, and time.   "I had an overdose yesterday. It was heroin". Patient endorses 8 year history of use. Patient states he was recently "sober" and relapsed a few months ago; says that he is in the process of linking with the [facility] in [place] and verbalized an interest in outpatient resources for substance abuse. Currently sees Izzy at [facility] for outpatient psychiatric services. He denies any thoughts of wanting to harm himself or others. States the overdose was not intentional and not his first tme. He denies any current or history of auditory or visual hallucinations and does not appear to be actively responding to any external/internal stimuli at this time.   HPI:   Terryon Pineiro is a 30 year old male with history of opiate abuse, continuous who presented to [facility] via EMS with suspected overdose. Patient received Narcan intranasally en route to hospital. Patient denies that the overdose was intentional; endorses 8 year history of use. Patient currently sees [facility] for outpatient mental health needs.   Past Psychiatric History:   -Opiate Abuse, continuous   Risk to Self:  pt denies Risk to Others:  pt denies Prior Inpatient Therapy:  yes Prior Outpatient Therapy:  yes  Past Medical History:  Past Medical History:  Diagnosis Date  . Asthma   . CHF  (congestive heart failure) (HCC)   . Opiate abuse, continuous (HCC)    Heroin    Past Surgical History:  Procedure Laterality Date  . APPLICATION OF ANGIOVAC N/A 05/10/2020   Procedure: APPLICATION OF ANGIOVAC;  Surgeon: [name], MD;  Location: [facility] OR;  Service: Vascular;  Laterality: N/A;  . BUBBLE STUDY  05/06/2020   Procedure: BUBBLE STUDY;  Surgeon: [name], MD;  Location: [facility] ENDOSCOPY;  Service: Cardiovascular;;  . I & D EXTREMITY Bilateral 05/03/2020   Procedure: BILATERAL IRRIGATION AND DEBRIDEMENT HANDS;  Surgeon: [name], MD;  Location: [facility] ORS;  Service: Orthopedics;  Laterality: Bilateral;  . MULTIPLE EXTRACTIONS WITH ALVEOLOPLASTY N/A 06/14/2020   Procedure: Surgical extraction of tooth #'s 3 and 28 with alveoloplasty and gross debridement of remaining dentition.;  Surgeon: [name], DDS;  Location: [facility] OR;  Service: Oral Surgery;  Laterality: N/A;  . TEE WITHOUT CARDIOVERSION N/A 05/06/2020   Procedure: TRANSESOPHAGEAL ECHOCARDIOGRAM (TEE);  Surgeon: [name], MD;  Location: [facility] ENDOSCOPY;  Service: Cardiovascular;  Laterality: N/A;  . TEE WITHOUT CARDIOVERSION N/A 05/10/2020   Procedure: TRANSESOPHAGEAL ECHOCARDIOGRAM (TEE);  Surgeon: [name], MD;  Location: [facility] OR;  Service: Thoracic;  Laterality: N/A;   Family History: History reviewed. No pertinent family history. Family Psychiatric  History: not noted Social History:  Social History   Substance and Sexual Activity  Alcohol Use Not Currently   Comment: rarely     Social History   Substance and Sexual Activity  Drug Use Not Currently  . Types: Marijuana, IV, "Crack" cocaine, Cocaine, Fentanyl, Methamphetamines, Amphetamines  Comment: Heroin    Social History   Socioeconomic History  . Marital status: Single    Spouse name: Not on file  . Number of children: Not on file  . Years of education: Not on file  . Highest education level: Not on file  Occupational  History  . Not on file  Tobacco Use  . Smoking status: Former Smoker    Packs/day: 0.50  . Smokeless tobacco: Never Used  Vaping Use  . Vaping Use: Never used  Substance and Sexual Activity  . Alcohol use: Not Currently    Comment: rarely  . Drug use: Not Currently    Types: Marijuana, IV, "Crack" cocaine, Cocaine, Fentanyl, Methamphetamines, Amphetamines    Comment: Heroin  . Sexual activity: Yes  Other Topics Concern  . Not on file  Social History Narrative  . Not on file   Social Determinants of Health   Financial Resource Strain: Not on file  Food Insecurity: Not on file  Transportation Needs: Not on file  Physical Activity: Not on file  Stress: Not on file  Social Connections: Not on file   Additional Social History:    Allergies:  No Known Allergies  Labs:  Results for orders placed or performed during the hospital encounter of 03/09/21 (from the past 48 hour(s))  Ethanol     Status: None   Collection Time: 03/09/21 11:52 AM  Result Value Ref Range   Alcohol, Ethyl (B) <10 <10 mg/dL    Comment: (NOTE) Lowest detectable limit for serum alcohol is 10 mg/dL.  For medical purposes only. Performed at [facility], [address] [place], [place] [address]   Basic metabolic panel     Status: Abnormal   Collection Time: 03/09/21 11:52 AM  Result Value Ref Range   Sodium 136 135 - 145 mmol/L   Potassium 3.4 (L) 3.5 - 5.1 mmol/L   Chloride 102 98 - 111 mmol/L   CO2 26 22 - 32 mmol/L   Glucose, Bld 79 70 - 99 mg/dL    Comment: Glucose reference range applies only to samples taken after fasting for at least 8 hours.   BUN 24 (H) 6 - 20 mg/dL   Creatinine, Ser 9.89 (H) 0.61 - 1.24 mg/dL   Calcium 9.0 8.9 - 21.1 mg/dL   GFR, Estimated >94 >17 mL/min    Comment: (NOTE) Calculated using the CKD-EPI Creatinine Equation (2021)    Anion gap 8 5 - 15    Comment: Performed at [facility], [address], [place], [place]  [address]  CBC with Differential     Status: None   Collection Time: 03/09/21 11:52 AM  Result Value Ref Range   WBC 7.6 4.0 - 10.5 K/uL   RBC 5.42 4.22 - 5.81 MIL/uL   Hemoglobin 16.4 13.0 - 17.0 g/dL   HCT 48.1 85.6 - 31.4 %   MCV 87.3 80.0 - 100.0 fL   MCH 30.3 26.0 - 34.0 pg   MCHC 34.7 30.0 - 36.0 g/dL   RDW 97.0 26.3 - 78.5 %   Platelets 336 150 - 400 K/uL   nRBC 0.0 0.0 - 0.2 %   Neutrophils Relative % 52 %   Neutro Abs 4.0 1.7 - 7.7 K/uL   Lymphocytes Relative 30 %   Lymphs Abs 2.3 0.7 - 4.0 K/uL   Monocytes Relative 12 %   Monocytes Absolute 0.9 0.1 - 1.0 K/uL   Eosinophils Relative 4 %   Eosinophils Absolute 0.3  0.0 - 0.5 K/uL   Basophils Relative 1 %   Basophils Absolute 0.1 0.0 - 0.1 K/uL   Immature Granulocytes 1 %   Abs Immature Granulocytes 0.04 0.00 - 0.07 K/uL    Comment: Performed at [facility], [address] [place], [place] [address]  Troponin I (High Sensitivity)     Status: None   Collection Time: 03/09/21 11:52 AM  Result Value Ref Range   Troponin I (High Sensitivity) 3 <18 ng/L    Comment: (NOTE) Elevated high sensitivity troponin I (hsTnI) values and significant  changes across serial measurements may suggest ACS but many other  chronic and acute conditions are known to elevate hsTnI results.  Refer to the "Links" section for chest pain algorithms and additional  guidance. Performed at [facility], [address] [place], [place] [address]   Resp Panel by RT-PCR (Flu A&B, Covid) Nasopharyngeal Swab     Status: None   Collection Time: 03/09/21  6:25 PM   Specimen: Nasopharyngeal Swab; Nasopharyngeal(NP) swabs in vial transport medium  Result Value Ref Range   SARS Coronavirus 2 by RT PCR NEGATIVE NEGATIVE    Comment: (NOTE) SARS-CoV-2 target nucleic acids are NOT DETECTED.  The SARS-CoV-2 RNA is generally detectable in upper respiratory specimens during the acute phase of infection. The  lowest concentration of SARS-CoV-2 viral copies this assay can detect is 138 copies/mL. A negative result does not preclude SARS-Cov-2 infection and should not be used as the sole basis for treatment or other patient management decisions. A negative result may occur with  improper specimen collection/handling, submission of specimen other than nasopharyngeal swab, presence of viral mutation(s) within the areas targeted by this assay, and inadequate number of viral copies(<138 copies/mL). A negative result must be combined with clinical observations, patient history, and epidemiological information. The expected result is Negative.  Fact Sheet for Patients:  BloggerCourse.com  Fact Sheet for Healthcare Providers:  SeriousBroker.it  This test is no t yet approved or cleared by the [place] FDA and  has been authorized for detection and/or diagnosis of SARS-CoV-2 by FDA under an Emergency Use Authorization (EUA). This EUA will remain  in effect (meaning this test can be used) for the duration of the COVID-19 declaration under Section 564(b)(1) of the Act, 21 U.S.C.section 360bbb-3(b)(1), unless the authorization is terminated  or revoked sooner.       Influenza A by PCR NEGATIVE NEGATIVE   Influenza B by PCR NEGATIVE NEGATIVE    Comment: (NOTE) The Xpert Xpress SARS-CoV-2/FLU/RSV plus assay is intended as an aid in the diagnosis of influenza from Nasopharyngeal swab specimens and should not be used as a sole basis for treatment. Nasal washings and aspirates are unacceptable for Xpert Xpress SARS-CoV-2/FLU/RSV testing.  Fact Sheet for Patients: BloggerCourse.com  Fact Sheet for Healthcare Providers: SeriousBroker.it  This test is not yet approved or cleared by the [place] FDA and has been authorized for detection and/or diagnosis of SARS-CoV-2 by FDA under an Emergency  Use Authorization (EUA). This EUA will remain in effect (meaning this test can be used) for the duration of the COVID-19 declaration under Section 564(b)(1) of the Act, 21 U.S.C. section 360bbb-3(b)(1), unless the authorization is terminated or revoked.  Performed at [facility], [address] [place], [place] [address]     Medications:  Current Facility-Administered Medications  Medication Dose Route Frequency Provider Last Rate Last Admin  . naloxone (NARCAN) injection 0.4 mg  0.4 mg Intravenous  Once Terald Sleeper, MD       Current Outpatient Medications  Medication Sig Dispense Refill  . clonazePAM (KLONOPIN) 0.5 MG tablet Take 0.5 mg by mouth daily.    Marland Kitchen gabapentin (NEURONTIN) 600 MG tablet Take 600 mg by mouth 3 (three) times daily.    . ivabradine (CORLANOR) 5 MG TABS tablet Take 1 tablet (5 mg total) by mouth 2 (two) times daily with a meal. 120 tablet 5  . sacubitril-valsartan (ENTRESTO) 49-51 MG Take 1 tablet by mouth 2 (two) times daily. 180 tablet 3  . sertraline (ZOLOFT) 50 MG tablet Take 75 mg by mouth daily.    Marland Kitchen amoxicillin (AMOXIL) 500 MG tablet Take 4 tablets (2,000 mg total) by mouth as directed. Take 1 hour before dental appointment (Patient not taking: Reported on 03/09/2021) 4 tablet 3  . carvedilol (COREG) 6.25 MG tablet TAKE 1 TABLET BY MOUTH 2 TIMES A DAY (Patient not taking: Reported on 03/09/2021) 68 tablet 2  . gabapentin (NEURONTIN) 300 MG capsule Take 1 capsule (300 mg total) by mouth 3 (three) times daily. For agitation (Patient not taking: Reported on 03/09/2021) 90 capsule 0  . hydrOXYzine (ATARAX/VISTARIL) 25 MG tablet Take 1 tablet (25 mg total) by mouth 3 (three) times daily as needed for anxiety. (Patient not taking: Reported on 03/09/2021) 75 tablet 0  . spironolactone (ALDACTONE) 25 MG tablet TAKE 1 TABLET BY MOUTH DAILY (Patient not taking: Reported on 03/09/2021) 34 tablet 2  . traZODone (DESYREL) 50 MG tablet Take 1 tablet (50  mg total) by mouth at bedtime as needed for sleep. (Patient not taking: Reported on 03/09/2021) 30 tablet 0   Musculoskeletal: Strength & Muscle Tone: within normal limits Gait & Station: normal Patient leans: N/A  Psychiatric Specialty Exam: Physical Exam Vitals and nursing note reviewed.  Psychiatric:        Attention and Perception: Attention and perception normal.        Mood and Affect: Mood and affect normal.        Speech: Speech normal.        Behavior: Behavior normal. Behavior is cooperative.        Thought Content: Thought content normal.        Cognition and Memory: Cognition and memory normal.        Judgment: Judgment normal.     Review of Systems  Psychiatric/Behavioral: Negative.   All other systems reviewed and are negative.   Blood pressure (!) 95/57, pulse 69, temperature 97.8 F (36.6 C), temperature source Oral, resp. rate 19, height  (1.702 m), weight 68 kg, SpO2 98 %.Body mass index is 23.49 kg/m.  General Appearance: Casual  Eye Contact:  Fair  Speech:  Clear and Coherent  Volume:  Normal  Mood:  Euthymic  Affect:  Congruent  Thought Process:  Goal Directed  Orientation:  Full (Time, Place, and Person)  Thought Content:  WDL  Suicidal Thoughts:  No  Homicidal Thoughts:  No  Memory:  Immediate;   Fair Recent;   Fair Remote;   Fair  Judgement:  Fair  Insight:  Present  Psychomotor Activity:  Normal  Concentration:  Concentration: Fair and Attention Span: Fair  Recall:  [employer] of Knowledge:  Fair  Language:  Fair  Akathisia:  NA  Handed:    AIMS (if indicated):     Assets:  Communication Skills Desire for Improvement Financial Resources/Insurance Housing Physical Health Resilience Social Support  ADL's:  Intact  Cognition:  WNL  Sleep:      Treatment Plan Summary: Plan discharge home with requested outpatient substance abuse resources for individual follow-up.   Disposition: No evidence of imminent risk to self or others at  present.   Patient does not meet criteria for psychiatric inpatient admission. Supportive therapy provided about ongoing stressors. Discussed crisis plan, support from social network, calling 911, coming to the Emergency Department, and calling Suicide Hotline.  This service was provided via telemedicine using a 2-way, interactive audio and video technology.  Names of all persons participating in this telemedicine service and their role in this encounter. Name: Maxie BarbBrooke Leevy-Johnson Role: PMHNP  Name: Nelly Routrchana Kumar Role: Attending MD  Name: Maryruth HancockJake Niblack Role: patient  Name:  Role:     Loletta ParishBrooke A Leevy-Johnson, NP 03/10/2021 8:56 AM

## 2021-04-28 ENCOUNTER — Observation Stay (HOSPITAL_COMMUNITY)
Admission: EM | Admit: 2021-04-28 | Discharge: 2021-04-29 | Disposition: A | Discharge status: Home / Self Care | Payer: Self-pay | Attending: Emergency Medicine | Admitting: Emergency Medicine

## 2021-04-28 DIAGNOSIS — F1510 Other stimulant abuse, uncomplicated: Secondary | ICD-10-CM

## 2021-04-28 DIAGNOSIS — Z87891 Personal history of nicotine dependence: Secondary | ICD-10-CM | POA: Insufficient documentation

## 2021-04-28 DIAGNOSIS — F1910 Other psychoactive substance abuse, uncomplicated: Secondary | ICD-10-CM

## 2021-04-28 DIAGNOSIS — Z20822 Contact with and (suspected) exposure to covid-19: Secondary | ICD-10-CM | POA: Insufficient documentation

## 2021-04-28 DIAGNOSIS — R109 Unspecified abdominal pain: Secondary | ICD-10-CM

## 2021-04-28 DIAGNOSIS — Z79899 Other long term (current) drug therapy: Secondary | ICD-10-CM | POA: Insufficient documentation

## 2021-04-28 DIAGNOSIS — J45909 Unspecified asthma, uncomplicated: Secondary | ICD-10-CM | POA: Insufficient documentation

## 2021-04-28 DIAGNOSIS — R1084 Generalized abdominal pain: Secondary | ICD-10-CM

## 2021-04-28 DIAGNOSIS — I5021 Acute systolic (congestive) heart failure: Secondary | ICD-10-CM | POA: Insufficient documentation

## 2021-04-28 DIAGNOSIS — N179 Acute kidney failure, unspecified: Principal | ICD-10-CM

## 2021-04-28 MED ORDER — SODIUM CHLORIDE 0.9 % IV BOLUS
1000.0000 mL | Freq: Once | INTRAVENOUS | Status: AC
  Administered 2021-04-28: 1000 mL via INTRAVENOUS

## 2021-04-28 NOTE — ED Triage Notes (Signed)
Pt bib gems c/o of abdominal pain starting today. [place] was [place] of [place]. Pt staes he has been out in the woods for 3 days without any food or water and started having abdominal pain after drinking creek water. Hx of IV drug use, endorses using meth yesterday but denies any use today. Endorses visual and auditory hallucinations after drinking creek water. 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 4mg  zofran and NS given PTA. 18g RAC.   HR: 115  BP: 108/80  RR: 24  CBG: 93

## 2021-04-29 ENCOUNTER — Emergency Department (HOSPITAL_COMMUNITY): Payer: Self-pay

## 2021-04-29 DIAGNOSIS — F1910 Other psychoactive substance abuse, uncomplicated: Secondary | ICD-10-CM

## 2021-04-29 DIAGNOSIS — R109 Unspecified abdominal pain: Secondary | ICD-10-CM

## 2021-04-29 DIAGNOSIS — N179 Acute kidney failure, unspecified: Principal | ICD-10-CM

## 2021-04-29 DIAGNOSIS — F1510 Other stimulant abuse, uncomplicated: Secondary | ICD-10-CM

## 2021-04-29 LAB — COMPREHENSIVE METABOLIC PANEL
ALT: 104 U/L — ABNORMAL HIGH (ref 0–44)
ALT: 91 U/L — ABNORMAL HIGH (ref 0–44)
AST: 52 U/L — ABNORMAL HIGH (ref 15–41)
AST: 35 U/L (ref 15–41)
Albumin: 4 g/dL (ref 3.5–5.0)
Albumin: 3.8 g/dL (ref 3.5–5.0)
Alkaline Phosphatase: 42 U/L (ref 38–126)
Alkaline Phosphatase: 42 U/L (ref 38–126)
Anion gap: 11 (ref 5–15)
Anion gap: 8 (ref 5–15)
BUN: 27 mg/dL — ABNORMAL HIGH (ref 6–20)
BUN: 27 mg/dL — ABNORMAL HIGH (ref 6–20)
CO2: 19 mmol/L — ABNORMAL LOW (ref 22–32)
CO2: 21 mmol/L — ABNORMAL LOW (ref 22–32)
Calcium: 8.6 mg/dL — ABNORMAL LOW (ref 8.9–10.3)
Calcium: 8.4 mg/dL — ABNORMAL LOW (ref 8.9–10.3)
Chloride: 105 mmol/L (ref 98–111)
Chloride: 109 mmol/L (ref 98–111)
Creatinine, Ser: 2.77 mg/dL — ABNORMAL HIGH (ref 0.61–1.24)
Creatinine, Ser: 1.99 mg/dL — ABNORMAL HIGH (ref 0.61–1.24)
GFR, Estimated: 31 mL/min — ABNORMAL LOW (ref >60)
GFR, Estimated: 45 mL/min — ABNORMAL LOW (ref >60)
Glucose, Bld: 74 mg/dL (ref 70–99)
Glucose, Bld: 85 mg/dL (ref 70–99)
Potassium: 4.3 mmol/L (ref 3.5–5.1)
Potassium: 4.2 mmol/L (ref 3.5–5.1)
Sodium: 135 mmol/L (ref 135–145)
Sodium: 138 mmol/L (ref 135–145)
Total Bilirubin: 2.4 mg/dL — ABNORMAL HIGH (ref 0.3–1.2)
Total Bilirubin: 2.3 mg/dL — ABNORMAL HIGH (ref 0.3–1.2)
Total Protein: 6.5 g/dL (ref 6.5–8.1)
Total Protein: 6.2 g/dL — ABNORMAL LOW (ref 6.5–8.1)

## 2021-04-29 LAB — CBC
HCT: 37.1 % — ABNORMAL LOW (ref 39.0–52.0)
Hemoglobin: 12.6 g/dL — ABNORMAL LOW (ref 13.0–17.0)
MCH: 30.5 pg (ref 26.0–34.0)
MCHC: 34 g/dL (ref 30.0–36.0)
MCV: 89.8 fL (ref 80.0–100.0)
Platelets: 219 10*3/uL (ref 150–400)
RBC: 4.13 MIL/uL — ABNORMAL LOW (ref 4.22–5.81)
RDW: 12.3 % (ref 11.5–15.5)
WBC: 10.5 10*3/uL (ref 4.0–10.5)
nRBC: 0 % (ref 0.0–0.2)

## 2021-04-29 LAB — RESP PANEL BY RT-PCR (FLU A&B, COVID) ARPGX2
Influenza A by PCR: NEGATIVE
Influenza B by PCR: NEGATIVE
SARS Coronavirus 2 by RT PCR: NEGATIVE

## 2021-04-29 LAB — HEPATITIS PANEL, ACUTE
HCV Ab: REACTIVE — AB
Hep A IgM: NONREACTIVE
Hep B C IgM: NONREACTIVE
Hepatitis B Surface Ag: NONREACTIVE

## 2021-04-29 LAB — CBC WITH DIFFERENTIAL/PLATELET
Abs Immature Granulocytes: 0.06 10*3/uL (ref 0.00–0.07)
Basophils Absolute: 0.1 10*3/uL (ref 0.0–0.1)
Basophils Relative: 1 %
Eosinophils Absolute: 0 10*3/uL (ref 0.0–0.5)
Eosinophils Relative: 0 %
HCT: 39.6 % (ref 39.0–52.0)
Hemoglobin: 13.8 g/dL (ref 13.0–17.0)
Immature Granulocytes: 1 %
Lymphocytes Relative: 19 %
Lymphs Abs: 2.4 10*3/uL (ref 0.7–4.0)
MCH: 30.5 pg (ref 26.0–34.0)
MCHC: 34.8 g/dL (ref 30.0–36.0)
MCV: 87.6 fL (ref 80.0–100.0)
Monocytes Absolute: 1.2 10*3/uL — ABNORMAL HIGH (ref 0.1–1.0)
Monocytes Relative: 9 %
Neutro Abs: 8.9 10*3/uL — ABNORMAL HIGH (ref 1.7–7.7)
Neutrophils Relative %: 70 %
Platelets: 305 10*3/uL (ref 150–400)
RBC: 4.52 MIL/uL (ref 4.22–5.81)
RDW: 12.1 % (ref 11.5–15.5)
WBC: 12.6 10*3/uL — ABNORMAL HIGH (ref 4.0–10.5)
nRBC: 0 % (ref 0.0–0.2)

## 2021-04-29 LAB — CREATININE, SERUM
Creatinine, Ser: 2.39 mg/dL — ABNORMAL HIGH (ref 0.61–1.24)
GFR, Estimated: 36 mL/min — ABNORMAL LOW (ref >60)

## 2021-04-29 LAB — URINALYSIS, ROUTINE W REFLEX MICROSCOPIC
Bilirubin Urine: NEGATIVE
Glucose, UA: NEGATIVE mg/dL
Ketones, ur: 20 mg/dL — AB
Leukocytes,Ua: NEGATIVE
Nitrite: NEGATIVE
Protein, ur: NEGATIVE mg/dL
Specific Gravity, Urine: 1.013 (ref 1.005–1.030)
pH: 6 (ref 5.0–8.0)

## 2021-04-29 LAB — RAPID URINE DRUG SCREEN, HOSP PERFORMED
Amphetamines: POSITIVE — AB
Barbiturates: NOT DETECTED
Benzodiazepines: NOT DETECTED
Cocaine: NOT DETECTED
Opiates: NOT DETECTED
Tetrahydrocannabinol: NOT DETECTED

## 2021-04-29 LAB — CK: Total CK: 367 U/L (ref 49–397)

## 2021-04-29 LAB — PHOSPHORUS: Phosphorus: 3.6 mg/dL (ref 2.5–4.6)

## 2021-04-29 LAB — HIV ANTIBODY (ROUTINE TESTING W REFLEX): HIV Screen 4th Generation wRfx: NONREACTIVE

## 2021-04-29 LAB — MAGNESIUM: Magnesium: 1.9 mg/dL (ref 1.7–2.4)

## 2021-04-29 LAB — LACTIC ACID, PLASMA
Lactic Acid, Venous: 1.1 mmol/L (ref 0.5–1.9)
Lactic Acid, Venous: 1.1 mmol/L (ref 0.5–1.9)

## 2021-04-29 LAB — LIPASE, BLOOD: Lipase: 30 U/L (ref 11–51)

## 2021-04-29 MED ORDER — SODIUM CHLORIDE 0.9 % IV BOLUS
1000.0000 mL | Freq: Once | INTRAVENOUS | Status: AC
  Administered 2021-04-29: 1000 mL via INTRAVENOUS

## 2021-04-29 MED ORDER — CLONAZEPAM 0.25 MG PO TBDP
0.2500 mg | ORAL_TABLET | Freq: Two times a day (BID) | ORAL | Status: DC | PRN
  Administered 2021-04-29: 0.25 mg via ORAL
  Filled 2021-04-29: qty 1

## 2021-04-29 MED ORDER — SACUBITRIL-VALSARTAN 49-51 MG PO TABS
1.0000 | ORAL_TABLET | Freq: Two times a day (BID) | ORAL | Status: DC
  Filled 2021-04-29: qty 1

## 2021-04-29 MED ORDER — SERTRALINE HCL 50 MG PO TABS
75.0000 mg | ORAL_TABLET | Freq: Every day | ORAL | Status: DC
  Filled 2021-04-29: qty 1

## 2021-04-29 MED ORDER — LACTATED RINGERS IV SOLN: INTRAVENOUS | Status: DC

## 2021-04-29 MED ORDER — ENOXAPARIN SODIUM 40 MG/0.4ML IJ SOSY
40.0000 mg | PREFILLED_SYRINGE | INTRAMUSCULAR | Status: DC
  Administered 2021-04-29: 40 mg via SUBCUTANEOUS
  Filled 2021-04-29: qty 0.4

## 2021-04-29 MED ORDER — IVABRADINE HCL 5 MG PO TABS
5.0000 mg | ORAL_TABLET | Freq: Two times a day (BID) | ORAL | Status: DC
  Filled 2021-04-29 (×2): qty 1

## 2021-04-29 NOTE — Discharge Summary (Signed)
Physician Discharge Summary  Clifford Clark SHF:[id number] DOB: 1991-05-28 DOA: 04/28/2021  PCP: [name]  Admit date: 04/28/2021 Discharge date: 04/29/2021  Admitted From: home with mother Disposition:  home (with mother)   Home Health: None Discharge Condition: Stable CODE STATUS: Full code Diet recommendation: Regular Consultations: None Procedures/Studies: None   Discharge Diagnoses:  Active Problems:   AKI (acute kidney injury) [facility])     Brief Summary: 30 year old male with a history of methamphetamine and fentanyl abuse, infective endocarditis, chronic systolic heart failure with a prior EF of 25% which has improved to 60%, anxiety and depression who presented to the ED with complaints of abdominal cramping. 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 Subsequently developed MRSA sepsis and respiratory failure and was intubated. Last echo 12/12/2020-EF 60 to 65%, normal diastolic function, tricuspid regurg was moderate   He states he had a bagel in the morning and then his mother states he went out with a friend in the afternoon.  The patient states he went to the woods in the afternoon and drink water from a creek on many instances and used methamphetamines.  He began to have cramping of his abdomen.  His mother said he also was hallucinating.  Brought to the hospital by EMS.  Blood pressures were fluctuating.  114/91, 92/63, 76/50, 85/57, 110/89 Creatinine was 2.77, ALT 105, AST 52, T bili 2.4, WBC count 12.6. UDS was positive for methamphetamines  Hospital Course:  Abdominal cramping - Has resolved-etiology uncertain-I am not certain if it is related to her drinking creek water or using methamphetamine yesterday - He is eating and drinking well - He admits that for at least a week he has been eating and drinking very poorly-this  past week he has used methamphetamines on 2 separate days  AKI - Last creatinine from 03/09/2021 was 1.28 -Given 3 L of normal saline and then placed on 100 cc of LR per hour - Creatinine yesterday was 2.77 and has improved to 1.99 after IV fluids - I suspect this was partly prerenal and as he is eating and drinking well now, I will stop IV fluids and discharge him  Polysubstance abuse -He has a history of meth amphetamine and fentanyl abuse -Consulted social work  Tachycardia-chronic systolic heart failure (improved), moderate tricuspid regurgitation secondary to infective endocarditis - He and his mother state that he is on Corlanor only now per cardiology however I do also see Entresto listed on his home meds  Anxiety - He takes Klonopin for this-it appears he was on sertraline but he stopped this on his own   Discharge Exam: Vitals:   04/29/21 1323 04/29/21 1442  BP: 92/66 106/68  Pulse: 97 85  Resp: 18 18  Temp: 97.6 F (36.4 C) 98 F (36.7 C)  SpO2: 100% 99%   Vitals:   04/29/21 1200 04/29/21 1300 04/29/21 1323 04/29/21 1442  BP: 107/60 105/73 92/66 106/68  Pulse: 95 84 97 85  Resp: 10  18 18   Temp:   97.6 F (36.4 C) 98 F (36.7 C)  TempSrc:   Oral Oral  SpO2: 98% 98% 100% 99%  Weight:    68.3 kg  Height:    5\' 7"  (1.702 m)    General: Pt is alert, awake, not in acute distress Cardiovascular: RRR, S1/S2 +, no rubs, no gallops Respiratory: CTA bilaterally, no wheezing, no rhonchi Abdominal: Soft,  NT, ND, bowel sounds + Extremities: no edema, no cyanosis   Discharge Instructions  Discharge Instructions     Diet - low sodium heart healthy   Complete by: As directed    Increase activity slowly   Complete by: As directed       Allergies as of 04/29/2021   No Known Allergies      Medication List     TAKE these medications    amoxicillin 500 MG tablet Commonly known as: AMOXIL Take 4 tablets (2,000 mg total) by mouth as directed. Take 1 hour  before dental appointment   clonazePAM 0.5 MG tablet Commonly known as: KLONOPIN Take 0.5 mg by mouth daily.   Entresto 49-51 MG Generic drug: sacubitril-valsartan Take 1 tablet by mouth 2 (two) times daily.   ivabradine 5 MG Tabs tablet Commonly known as: CORLANOR Take 1 tablet (5 mg total) by mouth 2 (two) times daily with a meal.   sertraline 50 MG tablet Commonly known as: ZOLOFT Take 75 mg by mouth daily.        No Known Allergies    DG Chest Port 1 View  Result Date: 04/29/2021 CLINICAL DATA:  Hypotension EXAM: PORTABLE CHEST 1 VIEW COMPARISON:  03/09/2021 FINDINGS: The heart size and mediastinal contours are within normal limits. Both lungs are clear. The visualized skeletal structures are unremarkable. IMPRESSION: No active disease. Electronically Signed   By: Alcide Clever M.D.   On: 04/29/2021 01:16     The results of significant diagnostics from this hospitalization (including imaging, microbiology, ancillary and laboratory) are listed below for reference.     Microbiology: Recent Results (from the past 240 hour(s))  Resp Panel by RT-PCR (Flu A&B, Covid) Nasopharyngeal Swab     Status: None   Collection Time: 04/29/21 12:58 AM   Specimen: Nasopharyngeal Swab; Nasopharyngeal(NP) swabs in vial transport medium  Result Value Ref Range Status   SARS Coronavirus 2 by RT PCR NEGATIVE NEGATIVE Final    Comment: (NOTE) SARS-CoV-2 target nucleic acids are NOT DETECTED.  The SARS-CoV-2 RNA is generally detectable in upper respiratory specimens during the acute phase of infection. The lowest concentration of SARS-CoV-2 viral copies this assay can detect is 138 copies/mL. A negative result does not preclude SARS-Cov-2 infection and should not be used as the sole basis for treatment or other patient management decisions. A negative result may occur with  improper specimen collection/handling, submission of specimen other than nasopharyngeal swab, presence of viral  mutation(s) within the areas targeted by this assay, and inadequate number of viral copies(<138 copies/mL). A negative result must be combined with clinical observations, patient history, and epidemiological information. The expected result is Negative.  Fact Sheet for Patients:  BloggerCourse.com  Fact Sheet for Healthcare Providers:  SeriousBroker.it  This test is no t yet approved or cleared by the [place] FDA and  has been authorized for detection and/or diagnosis of SARS-CoV-2 by FDA under an Emergency Use Authorization (EUA). This EUA will remain  in effect (meaning this test can be used) for the duration of the COVID-19 declaration under Section 564(b)(1) of the Act, 21 U.S.C.section 360bbb-3(b)(1), unless the authorization is terminated  or revoked sooner.       Influenza A by PCR NEGATIVE NEGATIVE Final   Influenza B by PCR NEGATIVE NEGATIVE Final    Comment: (NOTE) The Xpert Xpress SARS-CoV-2/FLU/RSV plus assay is intended as an aid in the diagnosis of influenza from Nasopharyngeal swab specimens and should not be used as a sole  basis for treatment. Nasal washings and aspirates are unacceptable for Xpert Xpress SARS-CoV-2/FLU/RSV testing.  Fact Sheet for Patients: BloggerCourse.comhttps://www.fda.gov/media/152166/download  Fact Sheet for Healthcare Providers: SeriousBroker.ithttps://www.fda.gov/media/152162/download  This test is not yet approved or cleared by the [place] FDA and has been authorized for detection and/or diagnosis of SARS-CoV-2 by FDA under an Emergency Use Authorization (EUA). This EUA will remain in effect (meaning this test can be used) for the duration of the COVID-19 declaration under Section 564(b)(1) of the Act, 21 U.S.C. section 360bbb-3(b)(1), unless the authorization is terminated or revoked.  Performed at [facility], [address] [place], [place] [address]      Labs: BNP (last 3  results) Recent Labs    08/25/20 1153 10/17/20 1802  BNP 20.0 32.8   Basic Metabolic Panel: Recent Labs  Lab 04/28/21 2321 04/29/21 0244 04/29/21 0500  NA 135  --  138  K 4.3  --  4.2  CL 105  --  109  CO2 19*  --  21*  GLUCOSE 74  --  85  BUN 27*  --  27*  CREATININE 2.77* 2.39* 1.99*  CALCIUM 8.6*  --  8.4*  MG  --   --  1.9  PHOS  --   --  3.6   Liver Function Tests: Recent Labs  Lab 04/28/21 2321 04/29/21 0500  AST 52* 35  ALT 104* 91*  ALKPHOS 42 42  BILITOT 2.4* 2.3*  PROT 6.5 6.2*  ALBUMIN 4.0 3.8   Recent Labs  Lab 04/28/21 2321  LIPASE 30   No results for input(s): AMMONIA in the last 168 hours. CBC: Recent Labs  Lab 04/28/21 2321 04/29/21 0244  WBC 12.6* 10.5  NEUTROABS 8.9*  --   HGB 13.8 12.6*  HCT 39.6 37.1*  MCV 87.6 89.8  PLT 305 219   Cardiac Enzymes: Recent Labs  Lab 04/29/21 0031  CKTOTAL 367   BNP: Invalid input(s): POCBNP CBG: No results for input(s): GLUCAP in the last 168 hours. D-Dimer No results for input(s): DDIMER in the last 72 hours. Hgb A1c No results for input(s): HGBA1C in the last 72 hours. Lipid Profile No results for input(s): CHOL, HDL, LDLCALC, TRIG, CHOLHDL, LDLDIRECT in the last 72 hours. Thyroid function studies No results for input(s): TSH, T4TOTAL, T3FREE, THYROIDAB in the last 72 hours.  Invalid input(s): FREET3 Anemia work up No results for input(s): VITAMINB12, FOLATE, FERRITIN, TIBC, IRON, RETICCTPCT in the last 72 hours. Urinalysis    Component Value Date/Time   COLORURINE YELLOW 04/29/2021 0725   APPEARANCEUR HAZY (A) 04/29/2021 0725   LABSPEC 1.013 04/29/2021 0725   PHURINE 6.0 04/29/2021 0725   GLUCOSEU NEGATIVE 04/29/2021 0725   HGBUR SMALL (A) 04/29/2021 0725   BILIRUBINUR NEGATIVE 04/29/2021 0725   KETONESUR 20 (A) 04/29/2021 0725   PROTEINUR NEGATIVE 04/29/2021 0725   NITRITE NEGATIVE 04/29/2021 0725   LEUKOCYTESUR NEGATIVE 04/29/2021 0725   Sepsis Labs Invalid input(s):  PROCALCITONIN,  WBC,  LACTICIDVEN Microbiology Recent Results (from the past 240 hour(s))  Resp Panel by RT-PCR (Flu A&B, Covid) Nasopharyngeal Swab     Status: None   Collection Time: 04/29/21 12:58 AM   Specimen: Nasopharyngeal Swab; Nasopharyngeal(NP) swabs in vial transport medium  Result Value Ref Range Status   SARS Coronavirus 2 by RT PCR NEGATIVE NEGATIVE Final    Comment: (NOTE) SARS-CoV-2 target nucleic acids are NOT DETECTED.  The SARS-CoV-2 RNA is generally detectable in upper respiratory specimens during the acute phase of infection. The  lowest concentration of SARS-CoV-2 viral copies this assay can detect is 138 copies/mL. A negative result does not preclude SARS-Cov-2 infection and should not be used as the sole basis for treatment or other patient management decisions. A negative result may occur with  improper specimen collection/handling, submission of specimen other than nasopharyngeal swab, presence of viral mutation(s) within the areas targeted by this assay, and inadequate number of viral copies(<138 copies/mL). A negative result must be combined with clinical observations, patient history, and epidemiological information. The expected result is Negative.  Fact Sheet for Patients:  BloggerCourse.com  Fact Sheet for Healthcare Providers:  SeriousBroker.it  This test is no t yet approved or cleared by the [place] FDA and  has been authorized for detection and/or diagnosis of SARS-CoV-2 by FDA under an Emergency Use Authorization (EUA). This EUA will remain  in effect (meaning this test can be used) for the duration of the COVID-19 declaration under Section 564(b)(1) of the Act, 21 U.S.C.section 360bbb-3(b)(1), unless the authorization is terminated  or revoked sooner.       Influenza A by PCR NEGATIVE NEGATIVE Final   Influenza B by PCR NEGATIVE NEGATIVE Final    Comment: (NOTE) The Xpert Xpress  SARS-CoV-2/FLU/RSV plus assay is intended as an aid in the diagnosis of influenza from Nasopharyngeal swab specimens and should not be used as a sole basis for treatment. Nasal washings and aspirates are unacceptable for Xpert Xpress SARS-CoV-2/FLU/RSV testing.  Fact Sheet for Patients: BloggerCourse.com  Fact Sheet for Healthcare Providers: SeriousBroker.it  This test is not yet approved or cleared by the [place] FDA and has been authorized for detection and/or diagnosis of SARS-CoV-2 by FDA under an Emergency Use Authorization (EUA). This EUA will remain in effect (meaning this test can be used) for the duration of the COVID-19 declaration under Section 564(b)(1) of the Act, 21 U.S.C. section 360bbb-3(b)(1), unless the authorization is terminated or revoked.  Performed at [facility], [address] [place], [place] [address]      Time coordinating discharge in minutes: 65  SIGNED:   Calvert Cantor, MD  [facility] 04/29/2021, 3:41 PM

## 2021-04-29 NOTE — H&P (Signed)
History and Physical  Clifford Clark LHT:[id number] DOB: 15-May-1991 DOA: 04/28/2021  Referring physician: Dr. Audley Hose, EDP. PCP: [name]  Outpatient Specialists: None Patient coming from: Home.    Chief Complaint: Abdominal cramping.  HPI: Clifford Clark is a 30 y.o. male with medical history significant for infective endocarditis, polysubstance abuse including IV drug abuse, essential hypertension, chronic anxiety/depression, polyneuropathy, who presented to [facility] ED with complaints of abdominal cramping that started on the day of presentation.  Patient has been in the woods for 3 days and started having abdominal pain after drinking creek water.  At the time of this visit the patient is minimally interactive and confused.  History is mainly obtained from EDP and from review of medical records.  Reports of using meth the day prior to presentation, visual and auditory hallucinations.  EMS was activated.  He was brought into the ED for further evaluation.  Work-up in the ED revealed AKI, dehydration and hypotension.  Patient received 3 liter IV fluid boluses normal saline.  TRH, hospitalist team, was asked to admit.  ED Course: Temperature 98.3, BP 72/58, pulse 87, respiratory 22, O2 saturation 100% on room air.  Lab studies remarkable for serum sodium 135, potassium 4.3, serum bicarb 19, glucose 94, BUN 27, creatinine 2.77, BUN 27, creatinine 2.77, anion gap of 11, AST 52, ALT 105, total bilirubin 2.4, GFR of 21. WBC 12.6, hemoglobin 13.8, MCV 87.6, platelet count 305.  Review of Systems: Review of systems as noted in the HPI. All other systems reviewed and are negative.   Past Medical History:  Diagnosis Date   Asthma    CHF (congestive heart failure) (HCC)    Opiate abuse, continuous (HCC)    Heroin   Past Surgical History:  Procedure Laterality Date   APPLICATION OF ANGIOVAC N/A 05/10/2020   Procedure: APPLICATION OF ANGIOVAC;  Surgeon: [name], MD;   Location: [facility] OR;  Service: Vascular;  Laterality: N/A;   BUBBLE STUDY  05/06/2020   Procedure: BUBBLE STUDY;  Surgeon: [name], MD;  Location: [facility] ENDOSCOPY;  Service: Cardiovascular;;   I & D EXTREMITY Bilateral 05/03/2020   Procedure: BILATERAL IRRIGATION AND DEBRIDEMENT HANDS;  Surgeon: [name], MD;  Location: [facility] ORS;  Service: Orthopedics;  Laterality: Bilateral;   MULTIPLE EXTRACTIONS WITH ALVEOLOPLASTY N/A 06/14/2020   Procedure: Surgical extraction of tooth #'s 3 and 28 with alveoloplasty and gross debridement of remaining dentition.;  Surgeon: [name], DDS;  Location: [facility] OR;  Service: Oral Surgery;  Laterality: N/A;   TEE WITHOUT CARDIOVERSION N/A 05/06/2020   Procedure: TRANSESOPHAGEAL ECHOCARDIOGRAM (TEE);  Surgeon: [name], MD;  Location: [facility] ENDOSCOPY;  Service: Cardiovascular;  Laterality: N/A;   TEE WITHOUT CARDIOVERSION N/A 05/10/2020   Procedure: TRANSESOPHAGEAL ECHOCARDIOGRAM (TEE);  Surgeon: [name], MD;  Location: [facility] OR;  Service: Thoracic;  Laterality: N/A;    Social History:  reports that he has quit smoking. He smoked an average of 0.50 packs per day. He has never used smokeless tobacco. He reports previous alcohol use. He reports previous drug use. Drugs: Marijuana, IV, "Crack" cocaine, Cocaine, Fentanyl, Methamphetamines, and Amphetamines.   No Known Allergies  No family history on file.  Unable to obtain family history from the patient due to lethargy and confusion.  Prior to Admission medications   Medication Sig Start Date End Date Taking? Authorizing Provider  amoxicillin (AMOXIL) 500 MG tablet Take 4 tablets (2,000 mg total) by mouth as directed. Take 1  hour before dental appointment Patient not taking: Reported on 03/09/2021 12/12/20   Bensimhon, [name], MD  carvedilol (COREG) 6.25 MG tablet TAKE 1 TABLET BY MOUTH 2 TIMES A DAY Patient not taking: Reported on 03/09/2021 07/01/20 07/01/21  [name], NP  clonazePAM  (KLONOPIN) 0.5 MG tablet Take 0.5 mg by mouth daily.    [provider]  gabapentin (NEURONTIN) 300 MG capsule Take 1 capsule (300 mg total) by mouth 3 (three) times daily. For agitation Patient not taking: Reported on 03/09/2021 10/21/20   [name], NP  gabapentin (NEURONTIN) 600 MG tablet Take 600 mg by mouth 3 (three) times daily. 02/13/21   [provider]  hydrOXYzine (ATARAX/VISTARIL) 25 MG tablet Take 1 tablet (25 mg total) by mouth 3 (three) times daily as needed for anxiety. Patient not taking: Reported on 03/09/2021 10/21/20   [name], NP  ivabradine (CORLANOR) 5 MG TABS tablet Take 1 tablet (5 mg total) by mouth 2 (two) times daily with a meal. 08/30/20   Bensimhon, [name], MD  sacubitril-valsartan (ENTRESTO) 49-51 MG Take 1 tablet by mouth 2 (two) times daily. 08/30/20   Bensimhon, [name], MD  sertraline (ZOLOFT) 50 MG tablet Take 75 mg by mouth daily.    [provider]  spironolactone (ALDACTONE) 25 MG tablet TAKE 1 TABLET BY MOUTH DAILY Patient not taking: Reported on 03/09/2021 07/01/20 07/01/21  [name], NP  traZODone (DESYREL) 50 MG tablet Take 1 tablet (50 mg total) by mouth at bedtime as needed for sleep. Patient not taking: Reported on 03/09/2021 10/21/20   [name], NP    Physical Exam: BP (!) 95/54   Pulse 81   Temp 98.3 F (36.8 C) (Oral)   Resp (!) 24   Ht 5\' 7"  (1.702 m)   Wt 68 kg   SpO2 100%   BMI 23.49 kg/m   General: 30 y.o. year-old male well developed well nourished in no acute distress.  Lethargic and confused, oriented to year. Cardiovascular: Regular rate and rhythm with no rubs or gallops.  No thyromegaly or JVD noted.  No lower extremity edema. 2/4 pulses in all 4 extremities. Respiratory: Clear to auscultation with no wheezes or rales. Good inspiratory effort. Abdomen: Soft nontender nondistended with normal bowel sounds x4 quadrants. Muskuloskeletal: No cyanosis, clubbing or edema noted  bilaterally Neuro: CN II-XII intact, strength, sensation, reflexes Skin: No ulcerative lesions noted or rashes Psychiatry: Judgement and insight appear altered.  Unable to assess mood due to lethargy          Labs on Admission:  Basic Metabolic Panel: Recent Labs  Lab 04/28/21 2321 04/29/21 0244  NA 135  --   K 4.3  --   CL 105  --   CO2 19*  --   GLUCOSE 74  --   BUN 27*  --   CREATININE 2.77* 2.39*  CALCIUM 8.6*  --    Liver Function Tests: Recent Labs  Lab 04/28/21 2321  AST 52*  ALT 104*  ALKPHOS 42  BILITOT 2.4*  PROT 6.5  ALBUMIN 4.0   Recent Labs  Lab 04/28/21 2321  LIPASE 30   No results for input(s): AMMONIA in the last 168 hours. CBC: Recent Labs  Lab 04/28/21 2321 04/29/21 0244  WBC 12.6* 10.5  NEUTROABS 8.9*  --   HGB 13.8 12.6*  HCT 39.6 37.1*  MCV 87.6 89.8  PLT 305 219   Cardiac Enzymes: Recent Labs  Lab 04/29/21 0031  CKTOTAL 367    BNP (last 3 results) Recent Labs    08/25/20 1153 10/17/20 1802  BNP 20.0 32.8    ProBNP (last 3 results) No results for input(s): PROBNP in the last 8760 hours.  CBG: No results for input(s): GLUCAP in the last 168 hours.  Radiological Exams on Admission: DG Chest Port 1 View  Result Date: 04/29/2021 CLINICAL DATA:  Hypotension EXAM: PORTABLE CHEST 1 VIEW COMPARISON:  03/09/2021 FINDINGS: The heart size and mediastinal contours are within normal limits. Both lungs are clear. The visualized skeletal structures are unremarkable. IMPRESSION: No active disease. Electronically Signed   By: Alcide Clever M.D.   On: 04/29/2021 01:16    EKG: I independently viewed the EKG done and my findings are as followed: Sinus rhythm rate of 92.  Nonspecific ST-T changes.  QTc 440.  Assessment/Plan Present on Admission:  AKI (acute kidney injury) (HCC)  Active Problems:   AKI (acute kidney injury) (HCC)  AKI, likely prerenal in the setting of dehydration. Baseline creatinine appears to be 1.2 with GFR  greater than 60 Presented with creatinine of 2.77 with GFR of 31 Avoid nephrotoxic agent, dehydration and hypotension Continue IV fluid hydration Monitor urine output Repeat BMP in the morning  Acute metabolic encephalopathy, unclear etiology Obtain UDS and follow-up urine analysis IV fluid hydration  Abdominal cramping, possibly from drinking creek water Suspect multifactorial UA pending  Acute transaminitis, unclear etiology Obtain acute hepatitis panel Repeat CMP in the morning Avoid hepatotoxic agents  Polysubstance abuse, including IV drug abuse Obtain UDS Polysubstance cessation counseling when no longer lethargic TOC to assist with providing resources  Hypertension BPs are currently soft Received 3 L IV fluid boluses to maintain MAP greater than 65. Continue IV fluid hydration    DVT prophylaxis: Subcu Lovenox daily  Code Status: Full code  Family Communication: None at bedside  Disposition Plan: Admit to  Consults called: None  Admission status: Observation status   Status is: Observation    Dispo:  Patient From: Home  Planned Disposition: Home, possibly on 04/30/2021.  Medically stable for discharge: No      Darlin Drop MD [facility] Pager [phone]  If 7PM-7AM, please contact night-coverage [email or web address] Password [email or web address]  04/29/2021, 5:02 AM

## 2021-04-29 NOTE — ED Notes (Signed)
Pt called to nursing station reporting increasing anxiety. See [facility]

## 2021-04-29 NOTE — Progress Notes (Signed)
Informed by MD that patient would be discharged.

## 2021-04-29 NOTE — ED Notes (Signed)
Tele  Breakfast Ordered 

## 2021-04-29 NOTE — ED Notes (Signed)
Attempted to call report x 1  

## 2021-04-29 NOTE — Progress Notes (Signed)
Patient discharged to private residence accompanied by mother.  Chose to leave ambulatory and unescorted to exit.  Discharge documentation reviewed with patient.  Medication list also reviewed.  Questions answered to patient's satisfaction.

## 2021-04-29 NOTE — Progress Notes (Signed)
Report received by Elveria Royals, RN.

## 2021-04-29 NOTE — ED Notes (Signed)
MD notified of blood pressure.  

## 2021-05-01 DIAGNOSIS — Z20822 Contact with and (suspected) exposure to covid-19: Secondary | ICD-10-CM | POA: Insufficient documentation

## 2021-05-01 DIAGNOSIS — I5022 Chronic systolic (congestive) heart failure: Secondary | ICD-10-CM | POA: Insufficient documentation

## 2021-05-01 DIAGNOSIS — R531 Weakness: Secondary | ICD-10-CM | POA: Insufficient documentation

## 2021-05-01 DIAGNOSIS — J45909 Unspecified asthma, uncomplicated: Secondary | ICD-10-CM | POA: Insufficient documentation

## 2021-05-01 DIAGNOSIS — R011 Cardiac murmur, unspecified: Secondary | ICD-10-CM | POA: Insufficient documentation

## 2021-05-01 DIAGNOSIS — G4700 Insomnia, unspecified: Secondary | ICD-10-CM | POA: Insufficient documentation

## 2021-05-01 DIAGNOSIS — R5383 Other fatigue: Secondary | ICD-10-CM | POA: Insufficient documentation

## 2021-05-01 DIAGNOSIS — Z87891 Personal history of nicotine dependence: Secondary | ICD-10-CM | POA: Insufficient documentation

## 2021-05-01 LAB — BLOOD CULTURE ID PANEL (REFLEXED) - BCID2

## 2021-05-01 NOTE — Progress Notes (Signed)
Received a call from microbiology regarding a positive blood culture 1/2 bottles positive for gram negative rods from anaerobic bottle.  Called and spoke with the patient and his mother.  His mother Clifford, Clark stated she will bring him in to the ED at [facility] tonight.    They both understand the severity of this finding, gram negative rods bacteremia.  All questions answered to the best of my ability.

## 2021-05-02 ENCOUNTER — Encounter (HOSPITAL_COMMUNITY): Payer: Self-pay | Admitting: Emergency Medicine

## 2021-05-02 ENCOUNTER — Emergency Department (HOSPITAL_COMMUNITY)
Admission: EM | Admit: 2021-05-02 | Discharge: 2021-05-02 | Disposition: A | Discharge status: Home / Self Care | Payer: Self-pay | Attending: Emergency Medicine | Admitting: Emergency Medicine

## 2021-05-02 ENCOUNTER — Emergency Department (HOSPITAL_COMMUNITY): Payer: Self-pay

## 2021-05-02 ENCOUNTER — Other Ambulatory Visit: Payer: Self-pay

## 2021-05-02 DIAGNOSIS — R531 Weakness: Secondary | ICD-10-CM

## 2021-05-02 DIAGNOSIS — R7881 Bacteremia: Secondary | ICD-10-CM

## 2021-05-02 DIAGNOSIS — R768 Other specified abnormal immunological findings in serum: Secondary | ICD-10-CM

## 2021-05-02 DIAGNOSIS — F1121 Opioid dependence, in remission: Secondary | ICD-10-CM

## 2021-05-02 LAB — CBC WITH DIFFERENTIAL/PLATELET
Abs Immature Granulocytes: 0.01 10*3/uL (ref 0.00–0.07)
Basophils Absolute: 0.1 10*3/uL (ref 0.0–0.1)
Basophils Relative: 1 %
Eosinophils Absolute: 0.4 10*3/uL (ref 0.0–0.5)
Eosinophils Relative: 5 %
HCT: 45.3 % (ref 39.0–52.0)
Hemoglobin: 15.2 g/dL (ref 13.0–17.0)
Immature Granulocytes: 0 %
Lymphocytes Relative: 55 %
Lymphs Abs: 3.9 10*3/uL (ref 0.7–4.0)
MCH: 30.5 pg (ref 26.0–34.0)
MCHC: 33.6 g/dL (ref 30.0–36.0)
MCV: 90.8 fL (ref 80.0–100.0)
Monocytes Absolute: 0.4 10*3/uL (ref 0.1–1.0)
Monocytes Relative: 6 %
Neutro Abs: 2.3 10*3/uL (ref 1.7–7.7)
Neutrophils Relative %: 33 %
Platelets: 295 10*3/uL (ref 150–400)
RBC: 4.99 MIL/uL (ref 4.22–5.81)
RDW: 12.5 % (ref 11.5–15.5)
WBC: 7.2 10*3/uL (ref 4.0–10.5)
nRBC: 0 % (ref 0.0–0.2)

## 2021-05-02 LAB — COMPREHENSIVE METABOLIC PANEL
ALT: 101 U/L — ABNORMAL HIGH (ref 0–44)
AST: 47 U/L — ABNORMAL HIGH (ref 15–41)
Albumin: 4.2 g/dL (ref 3.5–5.0)
Alkaline Phosphatase: 48 U/L (ref 38–126)
Anion gap: 10 (ref 5–15)
BUN: 13 mg/dL (ref 6–20)
CO2: 26 mmol/L (ref 22–32)
Calcium: 10.6 mg/dL — ABNORMAL HIGH (ref 8.9–10.3)
Chloride: 101 mmol/L (ref 98–111)
Creatinine, Ser: 0.81 mg/dL (ref 0.61–1.24)
GFR, Estimated: >60 mL/min (ref >60)
Glucose, Bld: 92 mg/dL (ref 70–99)
Potassium: 4 mmol/L (ref 3.5–5.1)
Sodium: 137 mmol/L (ref 135–145)
Total Bilirubin: 0.6 mg/dL (ref 0.3–1.2)
Total Protein: 6.9 g/dL (ref 6.5–8.1)

## 2021-05-02 LAB — URINALYSIS, ROUTINE W REFLEX MICROSCOPIC
Bilirubin Urine: NEGATIVE
Glucose, UA: NEGATIVE mg/dL
Hgb urine dipstick: NEGATIVE
Ketones, ur: NEGATIVE mg/dL
Leukocytes,Ua: NEGATIVE
Nitrite: NEGATIVE
Protein, ur: NEGATIVE mg/dL
Specific Gravity, Urine: 1.006 (ref 1.005–1.030)
pH: 6 (ref 5.0–8.0)

## 2021-05-02 LAB — PROTIME-INR
INR: 0.9 (ref 0.8–1.2)
Prothrombin Time: 12.4 seconds (ref 11.4–15.2)

## 2021-05-02 LAB — SARS CORONAVIRUS 2 (TAT 6-24 HRS): SARS Coronavirus 2: NEGATIVE

## 2021-05-02 LAB — LACTIC ACID, PLASMA: Lactic Acid, Venous: 1.5 mmol/L (ref 0.5–1.9)

## 2021-05-02 MED ORDER — VANCOMYCIN HCL 1500 MG/300ML IV SOLN
1500.0000 mg | Freq: Once | INTRAVENOUS | Status: AC
  Administered 2021-05-02: 1500 mg via INTRAVENOUS
  Filled 2021-05-02: qty 300

## 2021-05-02 MED ORDER — SODIUM CHLORIDE 0.9 % IV BOLUS
1000.0000 mL | Freq: Once | INTRAVENOUS | Status: AC
  Administered 2021-05-02: 1000 mL via INTRAVENOUS

## 2021-05-02 MED ORDER — SODIUM CHLORIDE 0.9 % IV SOLN: Freq: Once | INTRAVENOUS | Status: AC

## 2021-05-02 NOTE — ED Notes (Signed)
Dr. Jeraldine Loots notified of pt pressure 88 systolic

## 2021-05-02 NOTE — ED Provider Notes (Addendum)
[facility] EMERGENCY DEPARTMENT Provider Note   CSN: [id number] Arrival date & time: 05/01/21  2145     History Chief Complaint  Patient presents with   Blood Infection    Alistar Mcenery is a 30 y.o. male.  HPI 30 year old male with a history of methamphetamine and fentanyl abuse, infective endocarditis, chronic systolic heart failure with a prior EF of 25% which has improved to 60%, anxiety and depression who presents with his mother who after obtaining consent is available for additional history during presentation. Today presents with ongoing fatigue, insomnia, no focal pain, no fever.  However, the patient had blood cultures performed during his last hospitalization and those results returned positive for staph aureus. Since discharge no interval fever, vomiting, fall, pain. Acknowledges using methamphetamine just prior to his last hospitalization, per chart review, but it is unclear if he has used drugs since discharge.   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   Abdominal cramping 04/29/2021   Polysubstance abuse (HCC)-meth amphetamines and fentanyl 04/29/2021   Opiate abuse, continuous (HCC)    Moderate episode of recurrent major depressive disorder (HCC)    Drug overdose    Substance induced mood disorder (HCC) 10/21/2020   MDD (major depressive disorder), recurrent episode, severe (HCC) 10/16/2020   Positive hepatitis C antibody test 08/10/2020   Acute systolic heart failure (HCC)    Elevated BUN 05/02/2020   Malnutrition of moderate degree 04/29/2020   Moderate opioid dependence in early remission (HCC) 04/28/2020   Septic embolism (HCC) 04/28/2020   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   Homeless 04/28/2020    Hypokalemia 04/28/2020   Hyponatremia 04/28/2020   Encounter to establish care 04/02/2019    Past Surgical History:  Procedure Laterality Date   APPLICATION OF ANGIOVAC N/A 05/10/2020   Procedure: APPLICATION OF Johnn Hai;  Surgeon: [name], MD;  Location: [facility] OR;  Service: Vascular;  Laterality: N/A;   BUBBLE STUDY  05/06/2020   Procedure: BUBBLE STUDY;  Surgeon: [name], MD;  Location: [facility] ENDOSCOPY;  Service: Cardiovascular;;   I & D EXTREMITY Bilateral 05/03/2020   Procedure: BILATERAL IRRIGATION AND DEBRIDEMENT HANDS;  Surgeon: [name], MD;  Location: [facility] ORS;  Service: Orthopedics;  Laterality: Bilateral;   MULTIPLE EXTRACTIONS WITH ALVEOLOPLASTY N/A 06/14/2020   Procedure: Surgical extraction of tooth #'s 3 and 28 with alveoloplasty and gross debridement of remaining dentition.;  Surgeon: [name], DDS;  Location: [facility] OR;  Service: Oral Surgery;  Laterality: N/A;   TEE WITHOUT CARDIOVERSION N/A 05/06/2020   Procedure: TRANSESOPHAGEAL ECHOCARDIOGRAM (TEE);  Surgeon: [name], MD;  Location: [facility] ENDOSCOPY;  Service: Cardiovascular;  Laterality: N/A;   TEE WITHOUT CARDIOVERSION N/A 05/10/2020   Procedure: TRANSESOPHAGEAL ECHOCARDIOGRAM (TEE);  Surgeon: [name], MD;  Location: [facility] OR;  Service: Thoracic;  Laterality: N/A;       No family history on file.  Social History   Tobacco Use   Smoking status: Former    Packs/day: 0.50    Pack years: 0.00    Types: Cigarettes   Smokeless tobacco: Never  Vaping Use   Vaping Use: Never used  Substance Use Topics   Alcohol use: Not Currently  Comment: rarely   Drug use: Not Currently    Types: Marijuana, IV, "Crack" cocaine, Cocaine, Fentanyl, Methamphetamines, Amphetamines    Comment: Heroin    Home Medications Prior to Admission medications   Medication Sig Start Date End Date Taking? Authorizing Provider  clonazePAM (KLONOPIN) 0.5 MG tablet Take 0.5 mg by mouth daily.    Yes [provider]  ivabradine (CORLANOR) 5 MG TABS tablet Take 1 tablet (5 mg total) by mouth 2 (two) times daily with a meal. 08/30/20  Yes Bensimhon, [name], MD  sacubitril-valsartan (ENTRESTO) 49-51 MG Take 1 tablet by mouth 2 (two) times daily. 08/30/20  Yes Bensimhon, [name], MD  amoxicillin (AMOXIL) 500 MG tablet Take 4 tablets (2,000 mg total) by mouth as directed. Take 1 hour before dental appointment Patient not taking: Reported on 05/02/2021 12/12/20   Bensimhon, [name], MD    Allergies    Patient has no known allergies.  Review of Systems   Review of Systems  Constitutional:        Per HPI, otherwise negative  HENT:         Per HPI, otherwise negative  Respiratory:         Per HPI, otherwise negative  Cardiovascular:        Per HPI, otherwise negative  Gastrointestinal:  Negative for vomiting.  Endocrine:       Negative aside from HPI  Genitourinary:        Neg aside from HPI   Musculoskeletal:        Per HPI, otherwise negative  Skin: Negative.   Allergic/Immunologic: Positive for immunocompromised state (Relative immunocompromise state secondary to drug use, history of endocarditis).  Neurological:  Negative for syncope.   Physical Exam Updated Vital Signs BP 100/63 (BP Location: Right Arm)   Pulse 62   Temp 97.9 F (36.6 C) (Oral)   Resp 16   Ht 5\' 7"  (1.702 m)   Wt 68 kg   SpO2 100%   BMI 23.49 kg/m   Physical Exam Vitals and nursing note reviewed.  Constitutional:      General: He is not in acute distress.    Appearance: He is well-developed.  HENT:     Head: Normocephalic and atraumatic.  Eyes:     Conjunctiva/sclera: Conjunctivae normal.  Cardiovascular:     Rate and Rhythm: Normal rate and regular rhythm.     Heart sounds: Murmur (Minimal murmur) heard.  Pulmonary:     Effort: Pulmonary effort is normal. No respiratory distress.     Breath sounds: No stridor.  Abdominal:     General: There is no distension.  Musculoskeletal:         General: No deformity.     Right lower leg: No edema.     Left lower leg: No edema.  Skin:    General: Skin is warm and dry.  Neurological:     Mental Status: He is alert and oriented to person, place, and time.  Psychiatric:     Comments: Withdrawn, but answering specific questions appropriately, pleasantly.    ED Results / Procedures / Treatments   Labs (all labs ordered are listed, but only abnormal results are displayed) Labs Reviewed  COMPREHENSIVE METABOLIC PANEL - Abnormal; Notable for the following components:      Result Value   Calcium 10.6 (*)    AST 47 (*)    ALT 101 (*)    All other components within normal limits  CULTURE, BLOOD (ROUTINE X 2)  CULTURE, BLOOD (ROUTINE X 2)  SARS CORONAVIRUS 2 (TAT 6-24 HRS)  LACTIC ACID, PLASMA  CBC WITH DIFFERENTIAL/PLATELET  PROTIME-INR  LACTIC ACID, PLASMA  URINALYSIS, ROUTINE W REFLEX MICROSCOPIC    EKG None  Radiology DG Chest 2 View  Result Date: 05/02/2021 CLINICAL DATA:  Possible sepsis EXAM: CHEST - 2 VIEW COMPARISON:  04/29/2021 FINDINGS: The heart size and mediastinal contours are within normal limits. Both lungs are clear. The visualized skeletal structures are unremarkable. IMPRESSION: No active cardiopulmonary disease. Electronically Signed   By: Alcide Clever M.D.   On: 05/02/2021 01:57    Procedures Procedures   Medications Ordered in ED Medications  vancomycin (VANCOREADY) IVPB 1500 mg/300 mL (has no administration in time range)    ED Course  I have reviewed the triage vital signs and the nursing notes.  Pertinent labs & imaging results that were available during my care of the patient were reviewed by me and considered in my medical decision making (see chart for details).  Adult male with history of IV drug use, recent hospitalization presents with bacteremia after her recent blood cultures returned positive for staph aureus.  Patient awake, alert, though he is withdrawn, he has not confused,  disoriented.  Patient had labs performed, was placed on monitor, notable for cardiac 60s, pulse ox 100%, but hypotension, 80/60.  Patient received fluid resuscitation, started on vancomycin therapy, labs, cultures sent.  With consideration of bacteremia, hypotension, the patient required admission for monitoring, management. 10:39 AM Vital signs improving, MAP 74, ongoing fluids.  1:12 PM I discussed the patient's case with our internal medicine colleague.  On reviewing the chart, higher suspicion for contaminant contributing to blood culture.  Here patient's vital signs are similar to those of multiple recent values prior to discharge, he has no ongoing complaints beyond generalized weakness, and he and his mother are both comfortable with outpatient follow-up, with pending repeat blood cultures. MDM Rules/Calculators/A&P MDM Number of Diagnoses or Management Options Bacteremia: new, needed workup   Amount and/or Complexity of Data Reviewed Clinical lab tests: ordered and reviewed Tests in the radiology section of CPT: ordered and reviewed Tests in the medicine section of CPT: reviewed and ordered Decide to obtain previous medical records or to obtain history from someone other than the patient: yes Obtain history from someone other than the patient: yes Review and summarize past medical records: yes Discuss the patient with other providers: yes Independent visualization of images, tracings, or specimens: yes  Risk of Complications, Morbidity, and/or Mortality Presenting problems: high Diagnostic procedures: high Management options: high  Critical Care Total time providing critical care: < 30 minutes  Patient Progress Patient progress: stable   Final Clinical Impression(s) / ED Diagnoses Final diagnoses:  Weakness     Gerhard Munch, MD 05/02/21 1039    Gerhard Munch, MD 05/02/21 1313

## 2021-05-02 NOTE — Consult Note (Signed)
ER Visit   Clifford Clark ZYS:[id number] DOB: December 03, 1990 DOA: 05/02/2021  PCP: Sheliah Hatch, PA-C Consultants:  Bensimhon - cardiology Patient coming from:  Home - lives with mother and step-father; NOK: Mother, [phone]  Chief Complaint: + blood cultures  HPI: Clifford Clark is a 30 y.o. male with medical history significant of polysubstance dependence (heroin, fentanyl, methamphetamine) with endocarditis; chronic systolic CHF; and anxiety/depression who was hospitalized form 7/8-9 with abdominal cramping and AKI.  Blood cultures were positive (1/2 GNR) and he was notified on 7/9 and suggested to come to the ER.  Since his last d/c, he was ok.  He did feel more fatigued yesterday and took more naps.  No fever/chills.  Last use was Wednesday of last week, used meth.  He is attending classes at the [employer].  He has been on Suboxone in the past, but quitting on his own now.  When he has pain, he takes Maeng Do Kratom (contains mitragyna speciosa leaf).  Denies tobacco, occasional marijuana (a couple of times a year).   ED Course:  Polysubstance abuse including IVDA.  H/o endocarditis.  Has done ok since d/c.  No fever, just fatigue.  Labs ok.  Blood cultures  Review of Systems: As per HPI; otherwise review of systems reviewed and negative.   Ambulatory Status:  Ambulates without assistance  COVID Vaccine Status:  None  Past Medical History:  Diagnosis Date   Asthma    CHF (congestive heart failure) (HCC)    Opiate abuse, continuous (HCC)    Heroin    Past Surgical History:  Procedure Laterality Date   APPLICATION OF ANGIOVAC N/A 05/10/2020   Procedure: APPLICATION OF ANGIOVAC;  Surgeon: [name], MD;  Location: [facility] OR;  Service: Vascular;  Laterality: N/A;   BUBBLE STUDY  05/06/2020   Procedure: BUBBLE STUDY;  Surgeon: [name], MD;  Location: [facility] ENDOSCOPY;  Service: Cardiovascular;;   I & D EXTREMITY Bilateral 05/03/2020   Procedure: BILATERAL  IRRIGATION AND DEBRIDEMENT HANDS;  Surgeon: [name], MD;  Location: [facility] ORS;  Service: Orthopedics;  Laterality: Bilateral;   MULTIPLE EXTRACTIONS WITH ALVEOLOPLASTY N/A 06/14/2020   Procedure: Surgical extraction of tooth #'s 3 and 28 with alveoloplasty and gross debridement of remaining dentition.;  Surgeon: [name], DDS;  Location: [facility] OR;  Service: Oral Surgery;  Laterality: N/A;   TEE WITHOUT CARDIOVERSION N/A 05/06/2020   Procedure: TRANSESOPHAGEAL ECHOCARDIOGRAM (TEE);  Surgeon: [name], MD;  Location: [facility] ENDOSCOPY;  Service: Cardiovascular;  Laterality: N/A;   TEE WITHOUT CARDIOVERSION N/A 05/10/2020   Procedure: TRANSESOPHAGEAL ECHOCARDIOGRAM (TEE);  Surgeon: [name], MD;  Location: [facility] OR;  Service: Thoracic;  Laterality: N/A;    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  Tobacco Use   Smoking status: Former    Packs/day: 0.50    Pack years: 0.00    Types: Cigarettes   Smokeless tobacco: Never  Vaping Use   Vaping Use: Never used  Substance and Sexual Activity   Alcohol use: Not Currently    Comment: rarely   Drug use: Not Currently    Types: Marijuana, IV, "Crack" cocaine, Cocaine, Fentanyl, Methamphetamines, Amphetamines    Comment:  last use about 04/26/21   Sexual activity: Yes  Other Topics Concern   Not on file  Social History Narrative   Not on file   Social Determinants of Health   Financial Resource Strain: Not on file  Food Insecurity: Not on file  Transportation Needs: Not on file  Physical Activity: Not on file  Stress: Not on file  Social Connections: Not on file  Intimate Partner Violence: Not on file    No Known Allergies  History reviewed. No pertinent family  history.  Prior to Admission medications   Medication Sig Start Date End Date Taking? Authorizing Provider  clonazePAM (KLONOPIN) 0.5 MG tablet Take 0.5 mg by mouth daily.   Yes [provider]  ivabradine (CORLANOR) 5 MG TABS tablet Take 1 tablet (5 mg total) by mouth 2 (two) times daily with a meal. 08/30/20  Yes Bensimhon, [name], MD  sacubitril-valsartan (ENTRESTO) 49-51 MG Take 1 tablet by mouth 2 (two) times daily. 08/30/20  Yes Bensimhon, [name], MD  amoxicillin (AMOXIL) 500 MG tablet Take 4 tablets (2,000 mg total) by mouth as directed. Take 1 hour before dental appointment Patient not taking: Reported on 05/02/2021 12/12/20   Dolores Patty, MD    Physical Exam: Vitals:   05/02/21 1030 05/02/21 1045 05/02/21 1200 05/02/21 1244  BP: (!) 89/57 (!) 89/54 93/70 (!) 93/59  Pulse: (!) 54 61 (!) 58 64  Resp: 16 17 13 20   Temp:    98 F (36.7 C)  TempSrc:      SpO2: 99% 99% 100% 99%  Weight:      Height:         General:  Appears calm and comfortable and is in NAD Eyes:  PERRL, EOMI, normal lids, iris ENT:  grossly normal hearing, lips & tongue, mmm; appropriate dentition Neck:  no LAD, masses or thyromegaly Cardiovascular:  RRR, no m/r/g. No LE edema.  Respiratory:   CTA bilaterally with no wheezes/rales/rhonchi.  Normal respiratory effort. Abdomen:  soft, NT, ND, NABS Back:   normal alignment, no CVAT Skin:  no rash or induration seen on limited exam Musculoskeletal:  grossly normal tone BUE/BLE, good ROM, no bony abnormality Psychiatric:  grossly normal mood and affect, speech fluent and appropriate, AOx3 Neurologic:  CN 2-12 grossly intact, moves all extremities in coordinated fashion    Radiological Exams on Admission: Independently reviewed - see discussion in A/P where applicable  DG Chest 2 View  Result Date: 05/02/2021 CLINICAL DATA:  Possible sepsis EXAM: CHEST - 2 VIEW COMPARISON:  04/29/2021 FINDINGS: The heart size and mediastinal contours are  within normal limits. Both lungs are clear. The visualized skeletal structures are unremarkable. IMPRESSION: No active cardiopulmonary disease. Electronically Signed   By: 06/30/2021 M.D.   On: 05/02/2021 01:57    EKG: not done   Labs on Admission: I have personally reviewed the available labs and imaging studies at the time of the admission.  Pertinent labs:   AST 47/ALT 101 Lactate 1.5 Normal CBC UA on 7/9: small Hgb, 20 ketones; today WNL UDS on 7/9 + amphetamines  HCV Abg + on 7/9 Blood culture 7/9 with 1/2 GNR (anaerobic); BCID negative Repeat blood cultures pending   Assessment/Plan Active Problems:   Moderate opioid dependence in early remission (HCC)   Bacteremia due to Gram-negative bacteria   Positive hepatitis C antibody test    Bacteremia -Patient previously hospitalized and noted to have 1/2 GNC in the anaerobic culture after d/c -He was  notified on 7/9, came in today for evaluation -He received 1 dose of Vancomycin -He is generally feel well other than fatigue -BCID negative -Culture appears to be a contaminant - GNR in 1/2 cultures, anaerobic -GNR generally do not cause endocarditis -Overall, in this circumstance, it is reasonable to offer the patient the option of overnight observation with repeat blood cultures - OR d/c to home with f/u of repeat blood cultures -I discussed this with the patient and his mother and both are in agreement with the plan to d/c to home -He will need to return if repeat blood cultures are positive, and he voices understanding about this -Warning s/sx also reviewed with indications to return and patient/mother are in agreement  Hypotension, borderline -Patient noted to have BPs 80-100/50-70 -He was given IVF to try to improve this, although his UA does not indicate dehydration -In review of prior hospitalization, BP is chronically low and appears to be at/near baseline -No further intervention is needed  Polysubstance  abuse -Patient reports that he stopped using opiates on his own and recognizes the need to now stop using all drugs including amphetamines -He reports that he has the family support and structure needed to be successful -He intends to start a job later this week and is looking forward to that -He is encouraged to get a sponsor and attend meetings to maintain sobriety/abstinence  Hepatitis C -Previously offered ID consultation but he does not appear to have followed up -Patient is encouraged to call the ID Clinic regarding this issue  Chronic systolic CHF -Most recent echo in 11/2020 showed normalization of EF and normal diastolic function -Previously on a multitude of heart meds - Spironolactone, Corlanor, Entresto as of 11/2020 -Appears to no longer be taking Spironolactone -Recommend outpatient f/u - may be able to eventually taper off Corlanor and Entresto, particularly if he remains substance-free  Anxiety -Reports taking Klonopin for this issue -I have reviewed this patient in the [place] Controlled Substances Reporting System.  He is receiving medications from only one provider and appears to be taking them as prescribed. -would suggest consideration of non-controlled option if possible     Note: This patient has been tested and is negative for the novel coronavirus COVID-19. The patient has NOT been vaccinated against COVID-19.    Thank you for this interesting consult.  Based on stable appearance of the patient and the current likelihood that the positive blood culture was a contaminant, the patient appears to be stable for d/c to home at this time.  He understands that he would need to return should his clinical circumstances change.     Jonah Blue MD [facility]   How to contact the [facility] Attending or Consulting provider 7A - 7P or covering provider during after hours 7P -7A, for this patient?  Check the care team in [facility] and look for a) attending/consulting [facility]  provider listed and b) the [facility] team listed Log into [email or web address] and use [facility]'s universal password to access. If you do not have the password, please contact the hospital operator. Locate the [facility] provider you are looking for under [facility] and page to a number that you can be directly reached. If you still have difficulty reaching the provider, please page the [facility] (Director on Call) for the Hospitalists listed on amion for assistance.   05/02/2021, 1:31 PM

## 2021-05-02 NOTE — ED Triage Notes (Signed)
Patient states that he received a phone call from [facility] stating that he has a blood infection.  Patient was here 4 days ago after being [place] of a highway.  Patient has history of IV drug use.

## 2021-05-02 NOTE — Discharge Instructions (Addendum)
As discussed, please monitor your condition carefully, and do not hesitate to return here.  Otherwise follow-up with your physician.   A new set of blood cultures has been initiated.  He will be made aware of abnormal findings.

## 2021-05-03 ENCOUNTER — Other Ambulatory Visit (HOSPITAL_COMMUNITY): Payer: Self-pay

## 2021-05-03 LAB — CULTURE, BLOOD (ROUTINE X 2)

## 2021-05-04 LAB — CULTURE, BLOOD (ROUTINE X 2): Culture: NO GROWTH

## 2021-05-07 ENCOUNTER — Other Ambulatory Visit (HOSPITAL_COMMUNITY): Payer: Self-pay | Admitting: Adult Health

## 2021-05-07 LAB — CULTURE, BLOOD (ROUTINE X 2)
Culture: NO GROWTH
Culture: NO GROWTH
Special Requests: ADEQUATE
Special Requests: ADEQUATE

## 2021-05-12 NOTE — ED Provider Notes (Signed)
[facility] Provider Note   CSN: [id number] Arrival date & time: 04/28/21  2133     History Chief Complaint  Patient presents with   Abdominal Pain    Clifford Clark is a 30 y.o. male.  Patient presents ER chief complaint of abdominal pain.  He states that he has been working on a detail in the woods and was thirsty and drank creek water.  Subsequently has been having abdominal pain cramping pain.  He has a history of substance abuse.  Otherwise denies any fevers or cough denies any diarrhea.      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   Abdominal cramping 04/29/2021   Polysubstance abuse (HCC)-meth amphetamines and fentanyl 04/29/2021   Opiate abuse, continuous (HCC)    Moderate episode of recurrent major depressive disorder (HCC)    Drug overdose    Substance induced mood disorder (HCC) 10/21/2020   MDD (major depressive disorder), recurrent episode, severe (HCC) 10/16/2020   Positive hepatitis C antibody test 08/10/2020   Acute systolic heart failure (HCC)    Elevated BUN 05/02/2020   Bacteremia due to Gram-negative bacteria 04/29/2020   Malnutrition of moderate degree 04/29/2020   Moderate opioid dependence in early remission (HCC) 04/28/2020   Septic embolism (HCC) 04/28/2020   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   Homeless 04/28/2020   Hypokalemia 04/28/2020   Hyponatremia 04/28/2020   Encounter to establish care 04/02/2019    Past Surgical History:  Procedure Laterality Date   APPLICATION OF ANGIOVAC N/A 05/10/2020   Procedure: APPLICATION OF Johnn Hai;  Surgeon: [name], MD;  Location: [facility] OR;  Service: Vascular;  Laterality: N/A;   BUBBLE STUDY  05/06/2020   Procedure: BUBBLE STUDY;   Surgeon: [name], MD;  Location: [facility] ENDOSCOPY;  Service: Cardiovascular;;   I & D EXTREMITY Bilateral 05/03/2020   Procedure: BILATERAL IRRIGATION AND DEBRIDEMENT HANDS;  Surgeon: [name], MD;  Location: [facility] ORS;  Service: Orthopedics;  Laterality: Bilateral;   MULTIPLE EXTRACTIONS WITH ALVEOLOPLASTY N/A 06/14/2020   Procedure: Surgical extraction of tooth #'s 3 and 28 with alveoloplasty and gross debridement of remaining dentition.;  Surgeon: [name], DDS;  Location: [facility] OR;  Service: Oral Surgery;  Laterality: N/A;   TEE WITHOUT CARDIOVERSION N/A 05/06/2020   Procedure: TRANSESOPHAGEAL ECHOCARDIOGRAM (TEE);  Surgeon: [name], MD;  Location[facility] ENDOSCOPY;  Service: Cardiovascular;  Laterality: N/A;   TEE WITHOUT CARDIOVERSION N/A 05/10/2020   Procedure: TRANSESOPHAGEAL ECHOCARDIOGRAM (TEE);  Surgeon: [name], MD;  Location: [facility] OR;  Service: Thoracic;  Laterality: N/A;       No family history on file.  Social History   Tobacco Use   Smoking status: Former    Packs/day: 0.50    Types: Cigarettes   Smokeless tobacco: Never  Vaping Use   Vaping Use: Never used  Substance Use Topics   Alcohol use: Not Currently    Comment: rarely   Drug use: Not Currently    Types: Marijuana, IV, "Crack" cocaine, Cocaine, Fentanyl, Methamphetamines, Amphetamines    Comment: last use about 04/26/21    Home Medications Prior to Admission medications   Medication Sig Start Date End Date Taking? Authorizing Provider  amoxicillin (AMOXIL) 500 MG tablet Take 4 tablets (2,000 mg total) by mouth as directed. Take 1 hour before dental appointment Patient not taking: Reported on 05/02/2021 12/12/20  Yes Bensimhon, [name], MD  clonazePAM (KLONOPIN) 0.5 MG tablet Take 0.5 mg by mouth daily.   Yes [provider]  ivabradine (CORLANOR) 5 MG TABS tablet Take 1 tablet (5 mg total) by mouth 2 (two) times daily with a meal. 08/30/20  Yes Bensimhon, [name], MD   sacubitril-valsartan (ENTRESTO) 49-51 MG Take 1 tablet by mouth 2 (two) times daily. 08/30/20  Yes Bensimhon, [name], MD    Allergies    Patient has no known allergies.  Review of Systems   Review of Systems  Constitutional:  Negative for fever.  HENT:  Negative for ear pain and sore throat.   Eyes:  Negative for pain.  Respiratory:  Negative for cough.   Cardiovascular:  Negative for chest pain.  Gastrointestinal:  Positive for abdominal pain.  Genitourinary:  Negative for flank pain.  Musculoskeletal:  Negative for back pain.  Skin:  Negative for color change and rash.  Neurological:  Negative for syncope.  All other systems reviewed and are negative.  Physical Exam Updated Vital Signs BP 106/68 (BP Location: Left Arm)   Pulse 85   Temp 98 F (36.7 C) (Oral)   Resp 18   Ht 5\' 7"  (1.702 m)   Wt 68.3 kg   SpO2 99%   BMI 23.58 kg/m   Physical Exam Constitutional:      General: He is not in acute distress.    Appearance: He is well-developed.  HENT:     Head: Normocephalic.     Nose: Nose normal.  Eyes:     Extraocular Movements: Extraocular movements intact.  Cardiovascular:     Rate and Rhythm: Normal rate.  Pulmonary:     Effort: Pulmonary effort is normal.  Abdominal:     General: There is no distension.     Tenderness: There is no abdominal tenderness.  Skin:    Coloration: Skin is not jaundiced.  Neurological:     Mental Status: He is alert. Mental status is at baseline.    ED Results / Procedures / Treatments   Labs (all labs ordered are listed, but only abnormal results are displayed) Labs Reviewed  CULTURE, BLOOD (ROUTINE X 2) - Abnormal; Notable for the following components:      Result Value   Culture   (*)    Value: FUSOBACTERIUM NUCLEATUM BETA LACTAMASE NEGATIVE Performed at [facility], [address], [place], [place]    All other components within normal limits  CBC WITH DIFFERENTIAL/PLATELET - Abnormal; Notable for  the following components:   WBC 12.6 (*)    Neutro Abs 8.9 (*)    Monocytes Absolute 1.2 (*)    All other components within normal limits  COMPREHENSIVE METABOLIC PANEL - Abnormal; Notable for the following components:   CO2 19 (*)    BUN 27 (*)    Creatinine, Ser 2.77 (*)    Calcium 8.6 (*)    AST 52 (*)    ALT 104 (*)    Total Bilirubin 2.4 (*)    GFR, Estimated 31 (*)    All other components within normal limits  URINALYSIS, ROUTINE W REFLEX MICROSCOPIC - Abnormal; Notable for the following components:   APPearance HAZY (*)    Hgb urine dipstick SMALL (*)    Ketones, ur 20 (*)  Bacteria, UA RARE (*)    All other components within normal limits  CBC - Abnormal; Notable for the following components:   RBC 4.13 (*)    Hemoglobin 12.6 (*)    HCT 37.1 (*)    All other components within normal limits  CREATININE, SERUM - Abnormal; Notable for the following components:   Creatinine, Ser 2.39 (*)    GFR, Estimated 36 (*)    All other components within normal limits  RAPID URINE DRUG SCREEN, HOSP PERFORMED - Abnormal; Notable for the following components:   Amphetamines POSITIVE (*)    All other components within normal limits  HEPATITIS PANEL, ACUTE - Abnormal; Notable for the following components:   HCV Ab Reactive (*)    All other components within normal limits  COMPREHENSIVE METABOLIC PANEL - Abnormal; Notable for the following components:   CO2 21 (*)    BUN 27 (*)    Creatinine, Ser 1.99 (*)    Calcium 8.4 (*)    Total Protein 6.2 (*)    ALT 91 (*)    Total Bilirubin 2.3 (*)    GFR, Estimated 45 (*)    All other components within normal limits  RESP PANEL BY RT-PCR (FLU A&B, COVID) ARPGX2  CULTURE, BLOOD (ROUTINE X 2)  BLOOD CULTURE ID PANEL (REFLEXED) - BCID2  LIPASE, BLOOD  CK  LACTIC ACID, PLASMA  LACTIC ACID, PLASMA  HIV ANTIBODY (ROUTINE TESTING W REFLEX)  MAGNESIUM  PHOSPHORUS    EKG EKG Interpretation  Date/Time:  Friday April 28 2021 22:29:01  EDT Ventricular Rate:  92 PR Interval:  124 QRS Duration: 102 QT Interval:  355 QTC Calculation: 440 R Axis:   -28 Text Interpretation: Sinus rhythm Incomplete RBBB and LAFB RSR' in V1 or V2, right VCD or RVH ST elev, probable normal early repol pattern Confirmed by Norman Clay ([id number]) on 04/28/2021 11:19:00 PM Also confirmed by Norman Clay ([id number]), editor Jillene Bucks [phone])  on 04/29/2021 12:12:23 PM  Radiology No results found.  Procedures Procedures   Medications Ordered in ED Medications  sodium chloride 0.9 % bolus 1,000 mL (0 mLs Intravenous Stopped 04/29/21 0050)  sodium chloride 0.9 % bolus 1,000 mL (0 mLs Intravenous Stopped 04/29/21 0140)  sodium chloride 0.9 % bolus 1,000 mL (0 mLs Intravenous Stopped 04/29/21 0328)    ED Course  I have reviewed the triage vital signs and the nursing notes.  Pertinent labs & imaging results that were available during my care of the patient were reviewed by me and considered in my medical decision making (see chart for details).    MDM Rules/Calculators/A&P                           Abdominal exam is benign with no guarding or rebound.  However labs show worsening creatinine function.  Patient appears to have an AKI.  Consultation with hospitalist for admission for AKI.  Final Clinical Impression(s) / ED Diagnoses Final diagnoses:  Generalized abdominal pain  AKI (acute kidney injury) [facility])    Rx / DC Orders ED Discharge Orders          Ordered    Increase activity slowly        04/29/21 1540    Diet - low sodium heart healthy        04/29/21 1540             Cheryll Cockayne, MD 05/12/21 1114

## 2021-05-22 ENCOUNTER — Telehealth: Payer: Self-pay

## 2021-05-22 ENCOUNTER — Other Ambulatory Visit (HOSPITAL_COMMUNITY): Payer: Self-pay | Admitting: *Deleted

## 2021-05-22 MED ORDER — IVABRADINE HCL 5 MG PO TABS: 5.0000 mg | ORAL_TABLET | Freq: Two times a day (BID) | ORAL | 0 refills | Status: DC

## 2021-05-22 NOTE — Telephone Encounter (Signed)
Left patient a voice mail to call back to schedule a follow up [place] for Hep C

## 2021-05-24 ENCOUNTER — Telehealth (HOSPITAL_COMMUNITY): Payer: Self-pay | Admitting: Pharmacy Technician

## 2021-05-24 ENCOUNTER — Other Ambulatory Visit (HOSPITAL_COMMUNITY): Payer: Self-pay

## 2021-05-24 MED ORDER — IVABRADINE HCL 5 MG PO TABS: 5.0000 mg | ORAL_TABLET | Freq: Two times a day (BID) | ORAL | 3 refills | Status: DC

## 2021-05-24 MED ORDER — ENTRESTO 49-51 MG PO TABS: 1.0000 | ORAL_TABLET | Freq: Two times a day (BID) | ORAL | 2 refills | Status: DC

## 2021-05-24 NOTE — Telephone Encounter (Addendum)
Advanced Heart Failure Patient Advocate Encounter  The patient's mom called and left a message regarding Corlanor RX. His assistance is set to expire in September. Asked Philicia (CMA) to send in a new RX for him to get one more refill while we renew the applications.  Emailed the patient both Corlanor and Entresto assistance forms to fill out and return. Will fax in once signatures are received.

## 2021-05-24 NOTE — Telephone Encounter (Signed)
Meds ordered this encounter  Medications   ivabradine (CORLANOR) 5 MG TABS tablet    Sig: Take 1 tablet (5 mg total) by mouth 2 (two) times daily with a meal.    Dispense:  120 tablet    Refill:  3    Please cancel all previous orders for current medication. Change in dosage or pill size.

## 2021-06-13 ENCOUNTER — Ambulatory Visit (HOSPITAL_COMMUNITY): Admission: RE | Admit: 2021-06-13 | Payer: Self-pay | Source: Ambulatory Visit

## 2021-06-13 ENCOUNTER — Inpatient Hospital Stay (HOSPITAL_COMMUNITY)
Admission: RE | Admit: 2021-06-13 | Discharge: 2021-06-13 | Disposition: A | Discharge status: Home / Self Care | Payer: Self-pay | Source: Ambulatory Visit | Attending: Internal Medicine | Admitting: Internal Medicine

## 2021-06-13 DIAGNOSIS — I361 Nonrheumatic tricuspid (valve) insufficiency: Secondary | ICD-10-CM

## 2021-06-13 NOTE — Progress Notes (Signed)
--- Patient did not show for appt. Note left for templating purposes only ---    ADVANCED HF CLINIC NOTE  PCP: Dr Alvis Lemmings Primary Cardiologist: Dr Gala Romney  ID: Dr Drue Second CT Surgery: Dr Cliffton Asters  HPI: Clifford Clark is a 30 year old with a history of h/o asthma, IVDA, TV endocarditis and systolic HF.  Admitted 7/21 after being found down on the side of the road. ER w/u showed multiple septic emboli to lungs with cavitary lesions. 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  Also found to have emboli to kidneys and hands. TEE with EF 60-65% small PFO. large TV vegetation with severe TR. No vegetations on Aov/MV despite left-sided emboli.  Underwent AngioJet removal of part of vegetation. Then developed MRSA sepsis and respiratory failure. Intubated and started on pressors. F/u echo LVEF 20-25%. RV normal. Hospital course complicated by respiratory failure and septic shock. As he improved we repeated limited ECHO and EF was up to 35-40% but had ongoing vegetation. HF meds adjusted. Completed IV antibiotics prior to d/c. Placed on corlanor due to persistent tachycardia. Heart rate better controlled at d/c with heart rate in the 80s.   Saw ID and CT chest showed resolution/improvement of septic emboli. Bcx negative.   Echo 08/11/20 EF 45-50% moderate to severe TR (seems more moderate). RV ok. Personally reviewed  Saw Dr Cliffton Asters 08/26/20 and decided to defer surgery as TR was moderate and no evidence of RV failure.   Admitted to [facility] in 12/21 for worsening psychosis in setting of recurrent meth use.  Today he returns for HF follow up with his mother. Has finished antibiotics. Remains very stressed and anxious. Worried about finding a job and also his finances.  Feels his Klonopin dose should be raised. Chest hurts at times with anxiety. No edema, orthopnea or PND. Was going to gym regularly but not going as much any more.   Echo 12/12/20: EF 60-65% RV normal. Moderate TR.     Studies:  Echo  04/29/20  LVEF 50-55%. RV normal - TEE 7/16, LVEF normal 60-65%, RV normal - Limited Echo 7/24, LVEF severely reduced 20-25%, RV systolic function hyperdynamic. MV w/ mild-mod MR but no seen vegetation . Medium size 0.5 x 1.75 cm mobile filamentous mass on the tricupsid valve that prolapses across the valve plane, consistent with vegetation. The tricuspid valve is abnormal. Tricuspid valve regurgitation  is severe.  -  Repeat limited echo 05/19/20 w/ interval improvement, LVEF up to 30-35%. Tricuspid valve vegetations are seen, measuring up to 1.8 x 0.8 cm attached to posterior leaflet and up to 1.8 x 1.0 cm attached to anterior  leaflet. The tricuspid valve is abnormal. Tricuspid valve regurgitation is  - ECHO 06/08/20 showed EF 35-40%, tricuspid valve with large vegetation. RV mildly reduced    ROS: All systems negative except as listed in HPI, PMH and Problem List.  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  Tobacco Use   Smoking status: Former    Packs/day: 0.50    Types: Cigarettes   Smokeless tobacco: Never  Vaping Use   Vaping Use: Never used  Substance and Sexual Activity   Alcohol use: Not Currently  Comment: rarely   Drug use: Not Currently    Types: Marijuana, IV, "Crack" cocaine, Cocaine, Fentanyl, Methamphetamines, Amphetamines    Comment: last use about 04/26/21   Sexual activity: Yes  Other Topics Concern   Not on file  Social History Narrative   Not on file   Social Determinants of Health   Financial Resource Strain: Not on file  Food Insecurity: Not on file  Transportation Needs: Not on file  Physical Activity: Not on file  Stress: Not on file  Social Connections: Not on file  Intimate Partner Violence: Not on  file    FH: No family history on file.  Past Medical History:  Diagnosis Date   Asthma    CHF (congestive heart failure) (HCC)    Opiate abuse, continuous (HCC)    Heroin    Current Outpatient Medications  Medication Sig Dispense Refill   amoxicillin (AMOXIL) 500 MG tablet Take 4 tablets (2,000 mg total) by mouth as directed. Take 1 hour before dental appointment (Patient not taking: Reported on 05/02/2021) 4 tablet 3   clonazePAM (KLONOPIN) 0.5 MG tablet Take 0.5 mg by mouth daily.     ivabradine (CORLANOR) 5 MG TABS tablet Take 1 tablet (5 mg total) by mouth 2 (two) times daily with a meal. 120 tablet 3   sacubitril-valsartan (ENTRESTO) 49-51 MG Take 1 tablet by mouth 2 (two) times daily. 60 tablet 2   No current facility-administered medications for this encounter.    There were no vitals filed for this visit.  Wt Readings from Last 3 Encounters:  05/02/21 68 kg (150 lb)  04/29/21 68.3 kg (150 lb 9.2 oz)  03/09/21 68 kg (150 lb)   PHYSICAL EXAM: General: Stressed/anxious No resp difficulty HEENT: normal Neck: supple. no JVD. Carotids 2+ bilat; no bruits. No lymphadenopathy or thryomegaly appreciated. Cor: PMI nondisplaced. Regular rate & rhythm. 2/6 TR Lungs: clear Abdomen: soft, nontender, nondistended. No hepatosplenomegaly. No bruits or masses. Good bowel sounds. Extremities: no cyanosis, clubbing, rash, edema Neuro: alert & orientedx3, cranial nerves grossly intact. moves all 4 extremities w/o difficulty. Affect anxious   ASSESSMENT & PLAN:  1. Chronic Systolic HF  Echo 04/29/20  LVEF 50-55%. RV normal - TEE 7/16, LVEF normal 60-65%, RV normal - Limited Echo 7/24, LVEF severely reduced 20-25%, RV systolic function hyperdynamic. MV w/ mild-mod MR but no seen vegetation  - Limited echo 7/29 w/ interval improvement, LVEF up to 30-35%. Given ongoing tachycardia ECHO was repeated and showed EF 35-40% with no large vegetation, RV mildly reduced.  - Echo 08/11/20 EF  45-50% moderate to severe TR (seems more moderate). RV ok. - Echo 12/12/20: EF 60-65% RV normal. No TV vegetation seen.  Moderate TR. - LVEF fully recovered - NYHA I. Volume status ok - Continue Spiro 25 mg daily.  - Continue corlanor to 5 mg bid .  - Continue Entresto 49/51 bid   - Carvedilol stopped by Psych. Will continue to hold given recovery of LV function.   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 There was also amall flickering leaflet mass, suggestive of vegetation of the pulmonic valve. No mitral or aortic valve pathology. - s/p Angiovac debulking on 7/20 but still with large vegetation  - Completed antibiotics.  - Followed closely by ID & TCTS - Much improved echo today with moderate  TR and normal RV. No evidence of increased pulmonary pressures. I have d/w Dr. Cliffton Asters previously and agree with watchful waiting as no clear indication at this time for TVR - particularly with recent relapse with substance abuse - Reminded of SBE prophylaxis   3. H/O IV Drug Use - Admitted to [facility] in 12/31 with acute psychosis in setting of meth use.  - Now following with outpatient counseling at [facility]   Arvilla Meres MD 12:54 AM

## 2021-07-20 ENCOUNTER — Other Ambulatory Visit (HOSPITAL_COMMUNITY): Payer: Self-pay

## 2021-07-20 MED ORDER — IVABRADINE HCL 5 MG PO TABS: 5.0000 mg | ORAL_TABLET | Freq: Two times a day (BID) | ORAL | 0 refills | Status: DC

## 2021-07-20 MED ORDER — ENTRESTO 49-51 MG PO TABS: 1.0000 | ORAL_TABLET | Freq: Two times a day (BID) | ORAL | 0 refills | Status: DC

## 2021-07-24 ENCOUNTER — Other Ambulatory Visit (HOSPITAL_COMMUNITY): Payer: Self-pay

## 2021-07-24 ENCOUNTER — Telehealth (HOSPITAL_COMMUNITY): Payer: Self-pay | Admitting: Pharmacy Technician

## 2021-07-24 ENCOUNTER — Other Ambulatory Visit (HOSPITAL_COMMUNITY): Payer: Self-pay | Admitting: *Deleted

## 2021-07-24 MED ORDER — IVABRADINE HCL 5 MG PO TABS: 5.0000 mg | ORAL_TABLET | Freq: Two times a day (BID) | ORAL | 0 refills | Status: DC

## 2021-07-24 NOTE — Telephone Encounter (Signed)
Advanced Heart Failure Patient Advocate Encounter  I received a message from the patient's mother requesting samples of Entresto and Corlanor. The patient previously had assistance for both medications that ended in September. Will have the applications at the check in desk for signature. Sent Heather (RN) a request for samples of both medications.   Will fax applications once signatures are obtained.

## 2021-07-28 NOTE — Telephone Encounter (Signed)
Advanced Heart Failure Patient Nurse, mental health in [employer] via fax.  Will follow up.

## 2021-08-07 NOTE — Telephone Encounter (Signed)
Advanced Heart Failure Patient Advocate Encounter   Patient was approved to receive Entresto from [employer]  Patient ID: [id number] Effective dates: 08/01/21 through 08/01/22  Advanced Heart Failure Patient Advocate Encounter   Patient was approved to receive Corlanor from [employer]  Patient ID: [id number] Effective dates: 08/04/21 through 08/04/22  Called and spoke with the patient's mother, provided phone numbers to both companies.  Archer Asa, CPhT

## 2021-10-08 IMAGING — DX DG ELBOW COMPLETE 3+V*R*
4 series · 4 of 4 positions shown · non-contrast
Comparison: No priors.

CLINICAL DATA: 28-year-old male with history of open wound on the
right elbow.

EXAM:
RIGHT ELBOW - COMPLETE 3+ VIEW

[elbow ap]
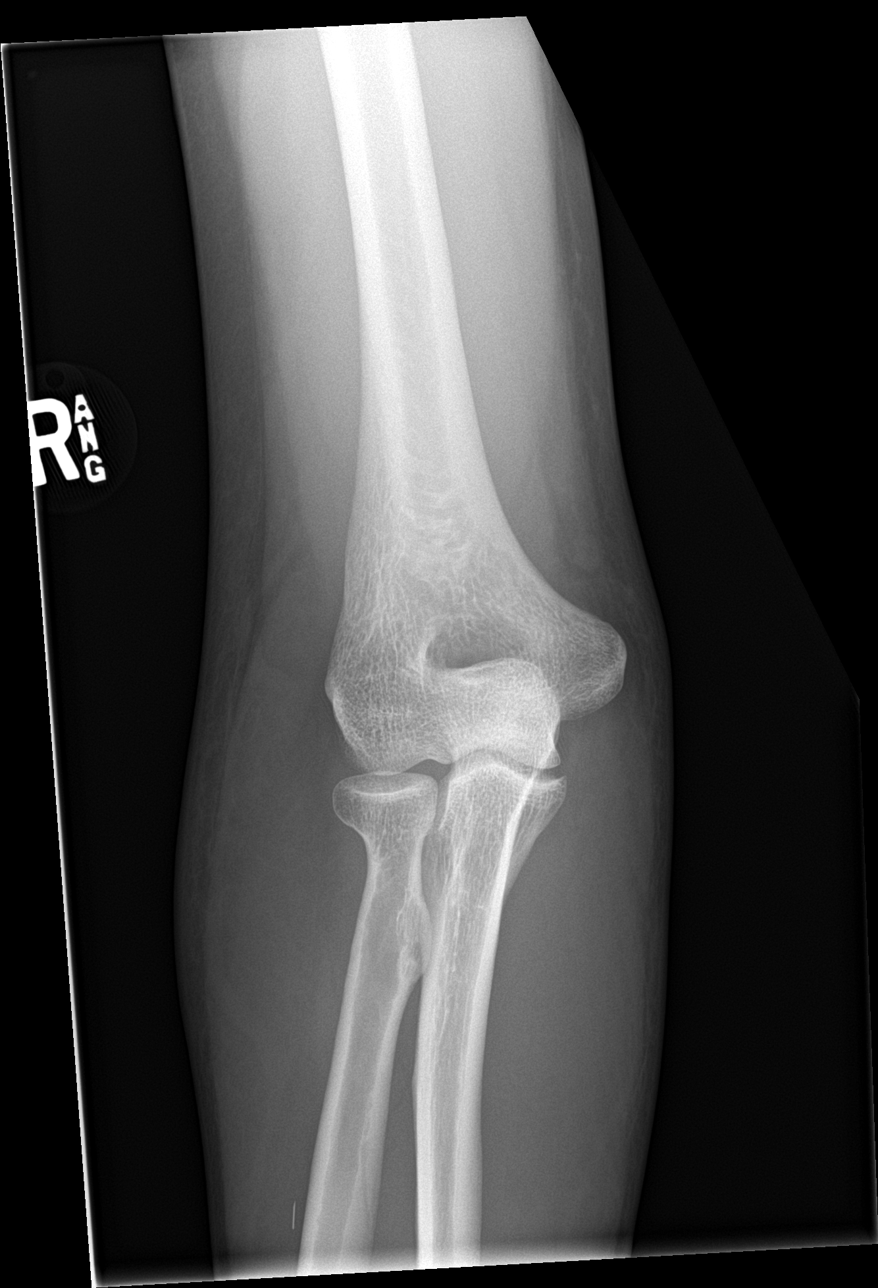

[elbow obl (1 of 2)]
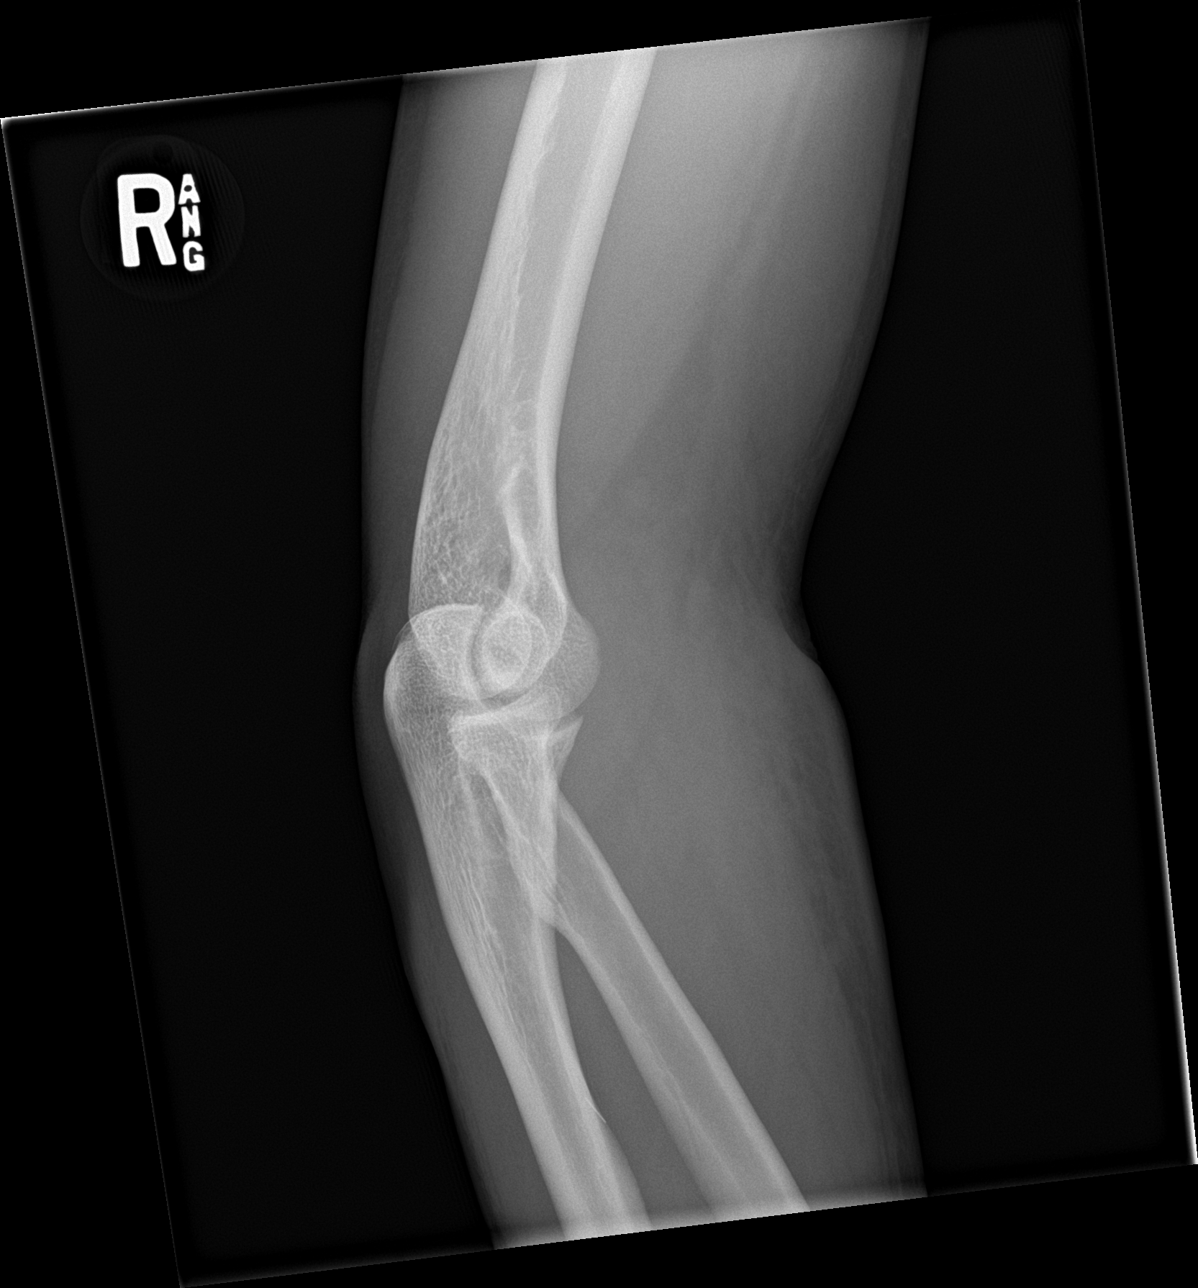

[elbow obl (2 of 2)]
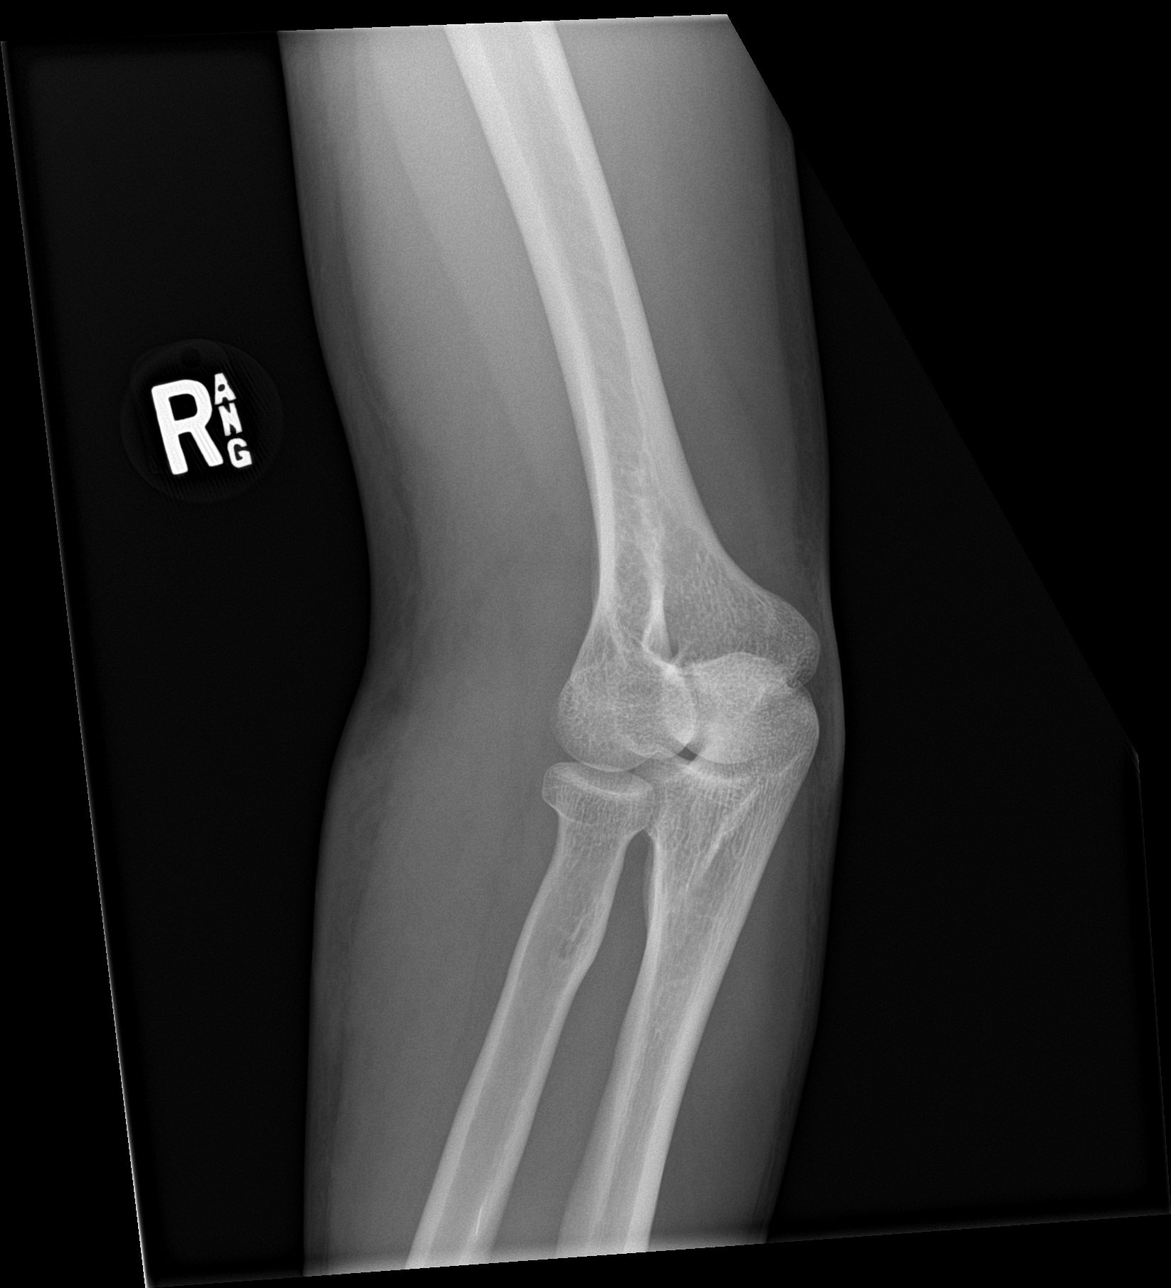

[elbow lat]
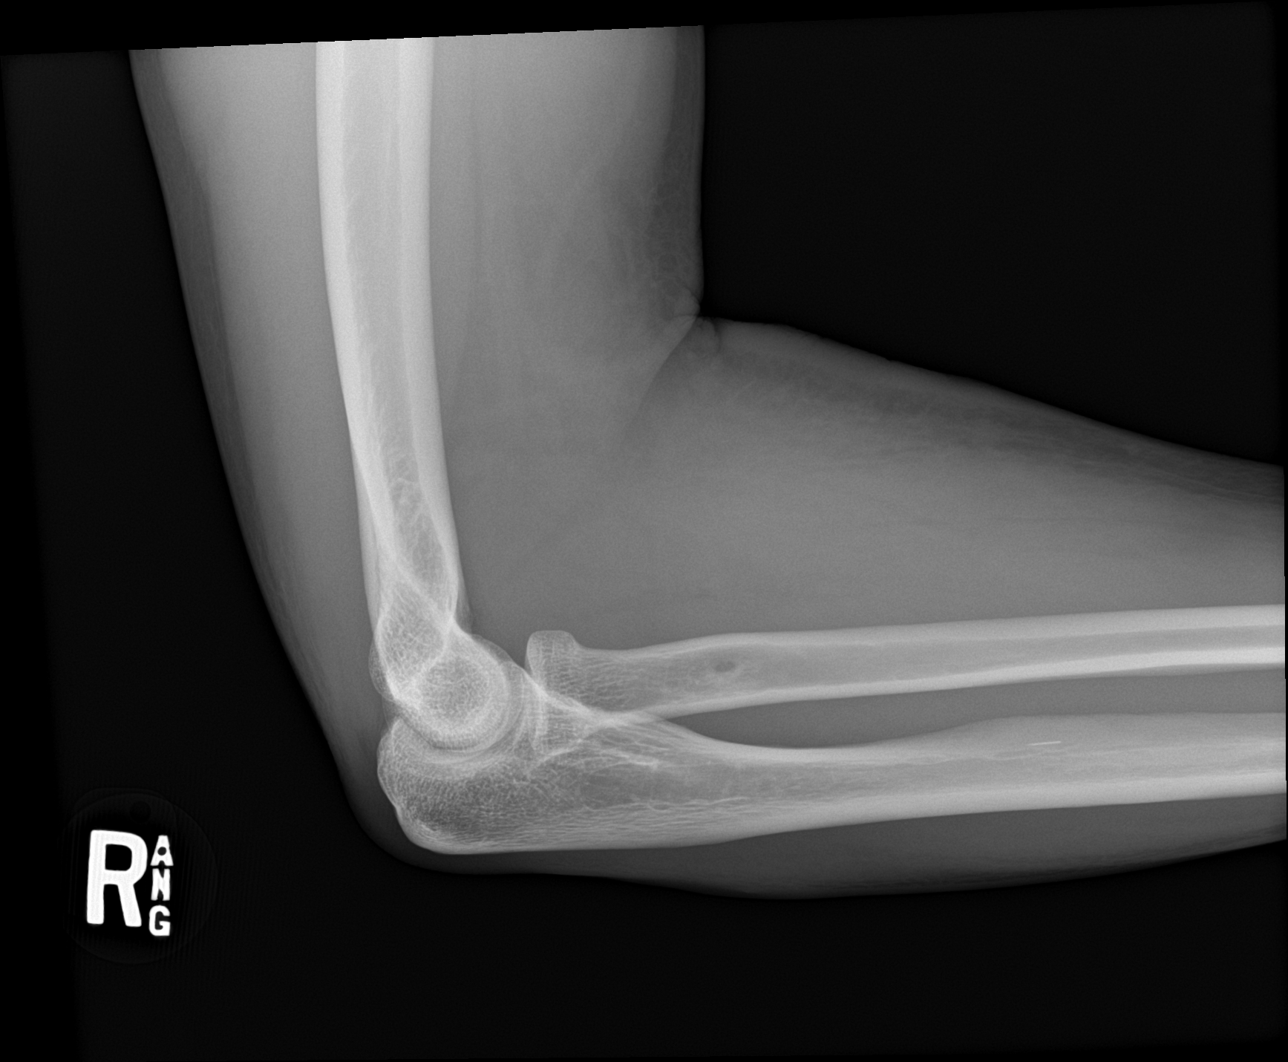

[4 of 4 positions shown; findings below may reference images not displayed]

FINDINGS: There is no evidence of fracture, dislocation, or joint effusion.
There is no evidence of arthropathy or other focal bone abnormality.
Small linear metallic foreign body in the soft tissues adjacent to
the proximal third of the radial diaphysis, likely a fractured
needle fragment.
IMPRESSION: 1. No acute osseous abnormality.
2. Small needle fragment in the soft tissues adjacent to the
proximal third of the radial diaphysis.

## 2021-11-17 ENCOUNTER — Encounter (HOSPITAL_COMMUNITY): Payer: Self-pay | Admitting: Internal Medicine

## 2021-11-17 ENCOUNTER — Ambulatory Visit (HOSPITAL_BASED_OUTPATIENT_CLINIC_OR_DEPARTMENT_OTHER)
Admission: RE | Admit: 2021-11-17 | Discharge: 2021-11-17 | Disposition: A | Discharge status: Home / Self Care | Payer: Self-pay | Source: Ambulatory Visit | Attending: Internal Medicine | Admitting: Internal Medicine

## 2021-11-17 ENCOUNTER — Other Ambulatory Visit (HOSPITAL_COMMUNITY): Payer: Self-pay | Admitting: *Deleted

## 2021-11-17 ENCOUNTER — Other Ambulatory Visit: Payer: Self-pay

## 2021-11-17 ENCOUNTER — Ambulatory Visit (HOSPITAL_COMMUNITY)
Admission: RE | Admit: 2021-11-17 | Discharge: 2021-11-17 | Disposition: A | Discharge status: Home / Self Care | Payer: Self-pay | Source: Ambulatory Visit | Attending: Family Medicine | Admitting: Family Medicine

## 2021-11-17 VITALS — BP 88/50 | HR 62 | Wt 157.2 lb

## 2021-11-17 DIAGNOSIS — F1121 Opioid dependence, in remission: Secondary | ICD-10-CM

## 2021-11-17 DIAGNOSIS — I2690 Septic pulmonary embolism without acute cor pulmonale: Secondary | ICD-10-CM | POA: Insufficient documentation

## 2021-11-17 DIAGNOSIS — R000 Tachycardia, unspecified: Secondary | ICD-10-CM | POA: Insufficient documentation

## 2021-11-17 DIAGNOSIS — Z09 Encounter for follow-up examination after completed treatment for conditions other than malignant neoplasm: Secondary | ICD-10-CM | POA: Insufficient documentation

## 2021-11-17 DIAGNOSIS — Z560 Unemployment, unspecified: Secondary | ICD-10-CM | POA: Insufficient documentation

## 2021-11-17 DIAGNOSIS — Z87891 Personal history of nicotine dependence: Secondary | ICD-10-CM | POA: Insufficient documentation

## 2021-11-17 DIAGNOSIS — I5022 Chronic systolic (congestive) heart failure: Secondary | ICD-10-CM

## 2021-11-17 DIAGNOSIS — Q2112 Patent foramen ovale: Secondary | ICD-10-CM | POA: Insufficient documentation

## 2021-11-17 DIAGNOSIS — I071 Rheumatic tricuspid insufficiency: Secondary | ICD-10-CM

## 2021-11-17 DIAGNOSIS — I38 Endocarditis, valve unspecified: Secondary | ICD-10-CM | POA: Insufficient documentation

## 2021-11-17 DIAGNOSIS — J45909 Unspecified asthma, uncomplicated: Secondary | ICD-10-CM | POA: Insufficient documentation

## 2021-11-17 DIAGNOSIS — R7881 Bacteremia: Secondary | ICD-10-CM | POA: Insufficient documentation

## 2021-11-17 DIAGNOSIS — Z79899 Other long term (current) drug therapy: Secondary | ICD-10-CM | POA: Insufficient documentation

## 2021-11-17 DIAGNOSIS — I079 Rheumatic tricuspid valve disease, unspecified: Secondary | ICD-10-CM

## 2021-11-17 LAB — CBC
HCT: 41 % (ref 39.0–52.0)
Hemoglobin: 14 g/dL (ref 13.0–17.0)
MCH: 30.3 pg (ref 26.0–34.0)
MCHC: 34.1 g/dL (ref 30.0–36.0)
MCV: 88.7 fL (ref 80.0–100.0)
Platelets: 334 10*3/uL (ref 150–400)
RBC: 4.62 MIL/uL (ref 4.22–5.81)
RDW: 12.5 % (ref 11.5–15.5)
WBC: 5.5 10*3/uL (ref 4.0–10.5)
nRBC: 0 % (ref 0.0–0.2)

## 2021-11-17 LAB — ECHOCARDIOGRAM COMPLETE
AR max vel: 2.99 cm2
AV Area VTI: 2.67 cm2
AV Area mean vel: 2.97 cm2
AV Mean grad: 4 mmHg
AV Peak grad: 6.5 mmHg
Ao pk vel: 1.27 m/s
Area-P 1/2: 2.7 cm2
Calc EF: 57.8 %
S' Lateral: 3.2 cm
Single Plane A2C EF: 60.8 %
Single Plane A4C EF: 55.5 %

## 2021-11-17 LAB — BASIC METABOLIC PANEL
Anion gap: 8 (ref 5–15)
BUN: 9 mg/dL (ref 6–20)
CO2: 27 mmol/L (ref 22–32)
Calcium: 9.3 mg/dL (ref 8.9–10.3)
Chloride: 105 mmol/L (ref 98–111)
Creatinine, Ser: 1.01 mg/dL (ref 0.61–1.24)
GFR, Estimated: >60 mL/min (ref >60)
Glucose, Bld: 96 mg/dL (ref 70–99)
Potassium: 4 mmol/L (ref 3.5–5.1)
Sodium: 140 mmol/L (ref 135–145)

## 2021-11-17 LAB — BRAIN NATRIURETIC PEPTIDE: B Natriuretic Peptide: 21.6 pg/mL (ref 0.0–100.0)

## 2021-11-17 MED ORDER — POTASSIUM CHLORIDE CRYS ER 20 MEQ PO TBCR: 20.0000 meq | EXTENDED_RELEASE_TABLET | Freq: Every day | ORAL | 3 refills | Status: DC | PRN

## 2021-11-17 MED ORDER — ENTRESTO 24-26 MG PO TABS: 1.0000 | ORAL_TABLET | Freq: Two times a day (BID) | ORAL | 3 refills | Status: DC

## 2021-11-17 MED ORDER — FUROSEMIDE 20 MG PO TABS: 20.0000 mg | ORAL_TABLET | Freq: Every day | ORAL | 3 refills | Status: DC | PRN

## 2021-11-17 NOTE — Progress Notes (Addendum)
ADVANCED HF CLINIC NOTE  PCP: Dr Margarita Rana Primary Cardiologist: Dr Haroldine Laws  ID: Dr Baxter Flattery CT Surgery: Dr Kipp Brood  HPI: Clifford Clark is a 31 year old with a history of h/o asthma, IVDA, TV endocarditis and systolic HF.  Admitted 7/21 after being found down on the side of the road. ER w/u showed multiple septic emboli to lungs with cavitary lesions. 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  Also found to have emboli to kidneys and hands. TEE with EF 60-65% small PFO. large TV vegetation with severe TR. No vegetations on Aov/MV despite left-sided emboli.  Underwent AngioJet removal of part of vegetation. Then developed MRSA sepsis and respiratory failure. Intubated and started on pressors. F/u echo LVEF 20-25%. RV normal. Hospital course complicated by respiratory failure and septic shock. As he improved we repeated limited ECHO and EF was up to 35-40% but had ongoing vegetation. HF meds adjusted. Completed IV antibiotics prior to d/c. Placed on corlanor due to persistent tachycardia. Heart rate better controlled at d/c with heart rate in the 80s.   Saw ID and CT chest showed resolution/improvement of septic emboli. Bcx negative.   Echo 08/11/20 EF 45-50% moderate to severe TR (seems more moderate). RV ok. Personally reviewed  Saw Dr Kipp Brood 08/26/20 and decided to defer surgery as TR was moderate and no evidence of RV failure.   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  Today he returns for HF follow up with his mother. Doing much better. Was in jail for 6 months. Now out. Has been off drugs since 06/05/21. Feels like he is bloating No orthopnea or PND.   Echo 11/17/21 EF 50-55% RV normal Severe TR Echo 12/12/20: EF 60-65% RV normal. Moderate TR.     Studies:  Echo 04/29/20  LVEF 50-55%. RV normal - TEE 7/16, LVEF normal 60-65%, RV normal - Limited Echo 7/24, LVEF severely reduced 0000000, RV systolic function hyperdynamic. MV w/ mild-mod MR but  no seen vegetation . Medium size 0.5 x 1.75 cm mobile filamentous mass on the tricupsid valve that prolapses across the valve plane, consistent with vegetation. The tricuspid valve is abnormal. Tricuspid valve regurgitation  is severe.  -  Repeat limited echo 05/19/20 w/ interval improvement, LVEF up to 30-35%. Tricuspid valve vegetations are seen, measuring up to 1.8 x 0.8 cm attached to posterior leaflet and up to 1.8 x 1.0 cm attached to anterior  leaflet. The tricuspid valve is abnormal. Tricuspid valve regurgitation is  - ECHO 06/08/20 showed EF 35-40%, tricuspid valve with large vegetation. RV mildly reduced    ROS: All systems negative except as listed in HPI, PMH and Problem List.  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  Tobacco Use   Smoking status: Former    Packs/day: 0.50    Types: Cigarettes   Smokeless tobacco: Never  Vaping Use   Vaping Use: Never used  Substance and Sexual Activity   Alcohol use: Not Currently    Comment: rarely   Drug use: Not Currently    Types: Marijuana, IV, "Crack" cocaine, Cocaine, Fentanyl, Methamphetamines, Amphetamines    Comment: last use about 04/26/21   Sexual  activity: Yes  Other Topics Concern   Not on file  Social History Narrative   Not on file   Social Determinants of Health   Financial Resource Strain: Not on file  Food Insecurity: Not on file  Transportation Needs: Not on file  Physical Activity: Not on file  Stress: Not on file  Social Connections: Not on file  Intimate Partner Violence: Not on file    FH: History reviewed. No pertinent family history.  Past Medical History:  Diagnosis Date   Asthma    CHF (congestive heart failure) (HCC)    Opiate abuse, continuous  (HCC)    Heroin    Current Outpatient Medications  Medication Sig Dispense Refill   amoxicillin (AMOXIL) 500 MG tablet Take 4 tablets (2,000 mg total) by mouth as directed. Take 1 hour before dental appointment 4 tablet 3   ivabradine (CORLANOR) 5 MG TABS tablet Take 1 tablet (5 mg total) by mouth 2 (two) times daily with a meal. 60 tablet 0   sacubitril-valsartan (ENTRESTO) 49-51 MG Take 1 tablet by mouth 2 (two) times daily. 180 tablet 0   No current facility-administered medications for this encounter.    Vitals:   11/17/21 1357  BP: (!) 88/50  Pulse: 62  SpO2: 98%  Weight: 71.3 kg (157 lb 3.2 oz)    Wt Readings from Last 3 Encounters:  11/17/21 71.3 kg (157 lb 3.2 oz)  05/02/21 68 kg (150 lb)  04/29/21 68.3 kg (150 lb 9.2 oz)   PHYSICAL EXAM: General:  Well appearing. No resp difficulty HEENT: normal Neck: supple. no JVD. Carotids 2+ bilat; no bruits. No lymphadenopathy or thryomegaly appreciated. Cor: PMI nondisplaced. Regular rate & rhythm. 2/6 TR Lungs: clear Abdomen: soft, nontender, nondistended. No hepatosplenomegaly. No bruits or masses. Good bowel sounds. Extremities: no cyanosis, clubbing, rash, edema Neuro: alert & orientedx3, cranial nerves grossly intact. moves all 4 extremities w/o difficulty. Affect pleasant    ASSESSMENT & PLAN:  1. Chronic Systolic HF  Echo [id number]  LVEF 50-55%. RV normal - TEE 7/16, LVEF normal 60-65%, RV normal - Limited Echo 7/24, LVEF severely reduced 0000000, RV systolic function hyperdynamic. MV w/ mild-mod MR but no seen vegetation  - Limited echo 7/29 w/ interval improvement, LVEF up to 30-35%. Given ongoing tachycardia ECHO was repeated and showed EF 35-40% with no large vegetation, RV mildly reduced.  - Echo 08/11/20 EF 45-50% moderate to severe TR (seems more moderate). RV ok. - Echo 12/12/20: EF 60-65% RV normal. No TV vegetation seen.  Moderate TR. - Echo today 11/17/20: EF 50-55% RV normal severe TR - NYHA I. Volume status  ok - Off spiro with low BP  - Continue corlanor to 5 mg bid .  - Drop Entresto to 24/26 bid with low BP   - Carvedilol stopped by Psych. Will continue to hold given recovery of LV function.  - Labs today  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 There was also amall flickering leaflet mass, suggestive of vegetation of the pulmonic valve. No mitral or aortic valve pathology. - s/p Angiovac debulking on 7/20 but still with large vegetation  - Completed antibiotics.  - TR looks severe on echo today. Will get TEE to reassess. Will need f/u with Dr. Kipp Brood after echo  - Reminded of SBE prophylaxis   3. H/O  IV Drug Use - Admitted to [facility] in 12/21 with acute psychosis in setting of meth use.  - Clean since 06/05/21  Glori Bickers MD 2:26 PM

## 2021-11-17 NOTE — Patient Instructions (Addendum)
Medication Changes:  Decrease Entresto to 24/26 mg Twice daily, **you can cut your 49/51 mg tabs in half and take 1/2 tab Twice daily   Take Furosemide 20 mg AS NEEDED  Take Potassium (klor-con) 20 meq ANY TIME YOU TAKE FUROSEMIDE  Lab Work:  Labs done today, your results will be available in MyChart, we will contact you for abnormal readings.  Testing/Procedures:  Your physician has requested that you have a TEE. During a TEE, sound waves are used to create images of your heart. It provides your doctor with information about the size and shape of your heart and how well your hearts chambers and valves are working. In this test, a transducer is attached to the end of a flexible tube thats guided down your throat and into your esophagus (the tube leading from you mouth to your stomach) to get a more detailed image of your heart. You are not awake for the procedure. Please see the instruction sheet given to you today. For further information please visit [email or web address] SEE INSTRUCTIONS BELOW  Referrals:  None  Special Instructions // Education:  Do the following things EVERYDAY: Weigh yourself in the morning before breakfast. Write it down and keep it in a log. Take your medicines as prescribed Eat low salt foods--Limit salt (sodium) to 2000 mg per day.  Stay as active as you can everyday Limit all fluids for the day to less than 2 liters   Follow-Up in: 4 months  At the [facility], you and your health needs are our priority. We have a designated team specialized in the treatment of Heart Failure. This Care Team includes your primary Heart Failure Specialized Cardiologist (physician), Advanced Practice Providers (APPs- Physician Assistants and Nurse Practitioners), and Pharmacist who all work together to provide you with the care you need, when you need it.   You may see any of the following providers on your designated Care Team at your next follow  up:  Dr Arvilla Meres Dr Carron Curie, NP Robbie Lis, [place] [facility] [place], [place] Karle Plumber, PharmD   Please be sure to bring in all your medications bottles to every appointment.   Need to Contact us:  If you have any questions or concerns before your next appointment please send [place] a message through [place] or call our office at [phone].    TO LEAVE A MESSAGE FOR THE NURSE SELECT OPTION 2, PLEASE LEAVE A MESSAGE INCLUDING: YOUR NAME DATE OF BIRTH CALL BACK NUMBER REASON FOR CALL**this is important as we prioritize the call backs  YOU WILL RECEIVE A CALL BACK THE SAME DAY AS LONG AS YOU CALL BEFORE 4:00 PM    TEE INSTRUCTIONS:  You are scheduled for a TEE  on Wednesday 12/06/21 with Dr. Gala Romney.   Please arrive at the [facility] (Main Entrance A) at [facility]: [address] [place], [place] [address] at 12:00 pm.   DIET: Nothing to eat or drink after midnight except a sip of water with medications (see medication instructions below)  FYI: For your safety, and to allow [place] to monitor your vital signs accurately during the surgery/procedure we request that   if you have artificial nails, gel coating, SNS etc. Please have those removed prior to your surgery/procedure. Not having the nail coverings /polish removed may result in cancellation or delay of your surgery/procedure.   Medication Instructions:  TAKE MEDS AS NORMAL  You must have a responsible person to drive you  home and stay in the waiting area during your procedure. Failure to do so could result in cancellation.  Bring your insurance cards.  *Special Note: Every effort is made to have your procedure done on time. Occasionally there are emergencies that occur at the hospital that may cause delays. Please be patient if a delay does occur.

## 2021-11-28 ENCOUNTER — Encounter (HOSPITAL_COMMUNITY): Payer: Self-pay | Admitting: Internal Medicine

## 2021-11-28 NOTE — Progress Notes (Signed)
Attempted to obtain medical history via telephone, unable to reach at this time. I left a voicemail to return pre surgical testing department's phone call.  

## 2021-12-06 ENCOUNTER — Encounter (HOSPITAL_COMMUNITY): Payer: Self-pay | Admitting: Anesthesiology

## 2021-12-06 ENCOUNTER — Ambulatory Visit (HOSPITAL_COMMUNITY): Payer: Self-pay

## 2021-12-06 ENCOUNTER — Ambulatory Visit (HOSPITAL_COMMUNITY): Admission: RE | Admit: 2021-12-06 | Payer: Self-pay | Source: Home / Self Care | Admitting: Internal Medicine

## 2021-12-06 ENCOUNTER — Encounter (HOSPITAL_COMMUNITY): Admission: RE | Payer: Self-pay | Source: Home / Self Care

## 2021-12-06 SURGERY — ECHOCARDIOGRAM, TRANSESOPHAGEAL
Anesthesia: Monitor Anesthesia Care

## 2021-12-06 NOTE — Anesthesia Preprocedure Evaluation (Deleted)
Anesthesia Evaluation    Reviewed: Allergy & Precautions, Patient's Chart, lab work & pertinent test results  Airway        Dental   Pulmonary asthma , former smoker,           Cardiovascular hypertension, Pt. on medications +CHF  + Valvular Problems/Murmurs (wide open TR)   Echo 10/2021: 1. Left ventricular ejection fraction, by estimation, is 50 to 55%. The  left ventricle has low normal function. The left ventricle has no regional  wall motion abnormalities. Left ventricular diastolic parameters were  normal.  2. Right ventricular systolic function is normal. The right ventricular  size is normal.  3. Right atrial size was moderately dilated.  4. The mitral valve is normal in structure. Trivial mitral valve  regurgitation. No evidence of mitral stenosis.  5. Wide open TR. Tricuspid valve regurgitation is severe.  6. The aortic valve is normal in structure. Aortic valve regurgitation is  not visualized. No aortic stenosis is present.  7. The inferior vena cava is normal in size with greater than 50%  respiratory variability, suggesting right atrial pressure of 3 mmHg.   Neuro/Psych PSYCHIATRIC DISORDERS Depression negative neurological ROS     GI/Hepatic negative GI ROS, (+)     substance abuse  methamphetamine use,   Endo/Other  negative endocrine ROS  Renal/GU Renal disease  negative genitourinary   Musculoskeletal negative musculoskeletal ROS (+) narcotic dependent  Abdominal   Peds  Hematology negative hematology ROS (+)   Anesthesia Other Findings   Reproductive/Obstetrics negative OB ROS                             Anesthesia Physical Anesthesia Plan  ASA: 4  Anesthesia Plan: MAC   Post-op Pain Management:    Induction:   PONV Risk Score and Plan: 2 and Propofol infusion and TIVA  Airway Management Planned: Natural Airway and Simple Face Mask  Additional  Equipment: None  Intra-op Plan:   Post-operative Plan:   Informed Consent:   Plan Discussed with:   Anesthesia Plan Comments:         Anesthesia Quick Evaluation

## 2022-01-18 ENCOUNTER — Telehealth (HOSPITAL_COMMUNITY): Payer: Self-pay

## 2022-01-18 ENCOUNTER — Other Ambulatory Visit (HOSPITAL_COMMUNITY): Payer: Self-pay

## 2022-01-18 MED ORDER — LOSARTAN POTASSIUM 25 MG PO TABS: 25.0000 mg | ORAL_TABLET | Freq: Every day | ORAL | 2 refills | Status: DC

## 2022-01-18 NOTE — Telephone Encounter (Signed)
Patients mom advised and verbalized understanding. Med list updated to reflect changes.  ? ?

## 2022-01-18 NOTE — Telephone Encounter (Addendum)
Patient called to report that he has been experiencing low blood pressures for about a week and wanted to know if his medications need to be adjusted. Patient was also in a detox program for the past week. Patient reports dizziness when standing, more frequent episodes of chest pain that last roughly 15 minutes to an hour but denies nausea/vomiting, swelling, starting any new medication and syncope. Patients blood pressures are listed below ? ?01/18/22-88/55 ?01/17/22-90/58 ?01/16/22-87/53   ? ?Heart rate has been between 79-90. ? ?Please advise.  ?

## 2022-01-22 ENCOUNTER — Inpatient Hospital Stay: Admission: AD | Admit: 2022-01-22 | Payer: Federal, State, Local not specified - Other | Admitting: Psychiatry

## 2022-01-23 NOTE — Progress Notes (Signed)
Pt left right after registration and prior to signing the MSE. ?

## 2022-02-13 IMAGING — DX DG CHEST 1V PORT
1 series · 1 of 1 positions shown · non-contrast
Comparison: May 14, 2020

CLINICAL DATA: Hypoxia

EXAM:
PORTABLE CHEST 1 VIEW

[chest ap]
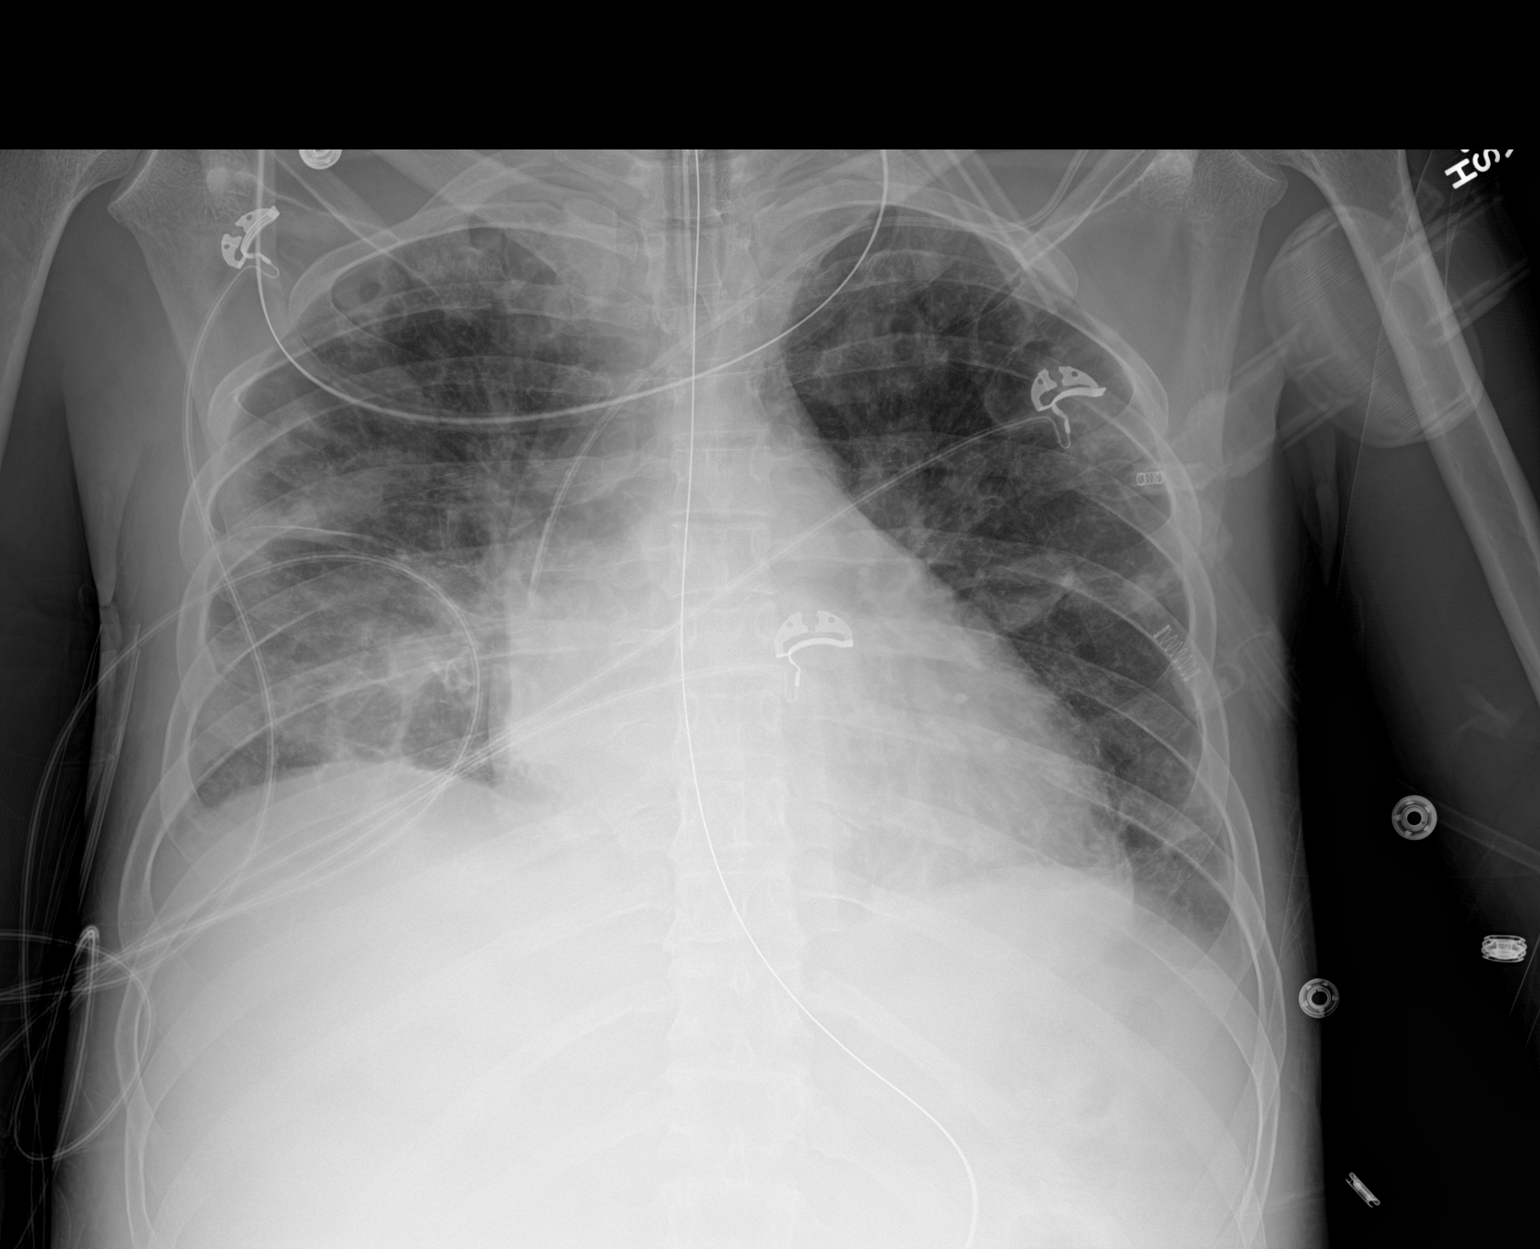

[1 of 1 positions shown; findings below may reference images not displayed]

FINDINGS: Endotracheal tube tip is 4.2 cm above the carina. Central catheter
tip is in the superior vena cava. Nasogastric tube tip and side port
are below the diaphragm. Right central catheter no longer evident.
No pneumothorax. There is patchy airspace opacity throughout much of
the right lung, essentially stable. Scattered areas of atelectatic
change noted in the left mid lung and left base. Heart is upper
normal in size with pulmonary vascularity normal. No evident
adenopathy. No bone lesions.
IMPRESSION: Tube and catheter positions as described without pneumothorax.
Suspect multifocal pneumonia on the right, essentially stable. Areas
of scattered atelectatic change on the left. Stable cardiac
silhouette.

## 2022-03-06 ENCOUNTER — Telehealth (HOSPITAL_COMMUNITY): Payer: Self-pay | Admitting: Vascular Surgery

## 2022-03-06 NOTE — Telephone Encounter (Signed)
Pt left message on scheduling line stating he had a episode with is heart woudd like to move 5/31 to a sooner appt, please advise [phone] ?

## 2022-03-06 NOTE — Telephone Encounter (Signed)
Called to schedule sooner appt no answer/no vm.  ?

## 2022-03-12 ENCOUNTER — Encounter: Payer: Self-pay | Admitting: Physician Assistant

## 2022-03-12 ENCOUNTER — Ambulatory Visit: Payer: Self-pay | Admitting: Physician Assistant

## 2022-03-12 VITALS — BP 114/74 | HR 77 | Temp 98.2°F | Resp 18

## 2022-03-12 DIAGNOSIS — F411 Generalized anxiety disorder: Secondary | ICD-10-CM

## 2022-03-12 DIAGNOSIS — F331 Major depressive disorder, recurrent, moderate: Secondary | ICD-10-CM

## 2022-03-12 DIAGNOSIS — Z114 Encounter for screening for human immunodeficiency virus [HIV]: Secondary | ICD-10-CM

## 2022-03-12 DIAGNOSIS — Z1159 Encounter for screening for other viral diseases: Secondary | ICD-10-CM

## 2022-03-12 DIAGNOSIS — F3341 Major depressive disorder, recurrent, in partial remission: Secondary | ICD-10-CM

## 2022-03-12 DIAGNOSIS — I079 Rheumatic tricuspid valve disease, unspecified: Secondary | ICD-10-CM

## 2022-03-12 DIAGNOSIS — F1510 Other stimulant abuse, uncomplicated: Secondary | ICD-10-CM

## 2022-03-12 DIAGNOSIS — R748 Abnormal levels of other serum enzymes: Secondary | ICD-10-CM

## 2022-03-12 DIAGNOSIS — F1110 Opioid abuse, uncomplicated: Secondary | ICD-10-CM

## 2022-03-12 DIAGNOSIS — B1920 Unspecified viral hepatitis C without hepatic coma: Secondary | ICD-10-CM

## 2022-03-12 MED ORDER — QUETIAPINE FUMARATE 50 MG PO TABS: 50.0000 mg | ORAL_TABLET | Freq: Every day | ORAL | 1 refills | Status: AC

## 2022-03-12 MED ORDER — FUROSEMIDE 20 MG PO TABS: 20.0000 mg | ORAL_TABLET | Freq: Every day | ORAL | 3 refills | Status: DC | PRN

## 2022-03-12 MED ORDER — POTASSIUM CHLORIDE CRYS ER 20 MEQ PO TBCR: 20.0000 meq | EXTENDED_RELEASE_TABLET | Freq: Every day | ORAL | 3 refills | Status: DC | PRN

## 2022-03-12 MED ORDER — IVABRADINE HCL 5 MG PO TABS: 5.0000 mg | ORAL_TABLET | Freq: Two times a day (BID) | ORAL | 1 refills | Status: DC

## 2022-03-12 MED ORDER — LOSARTAN POTASSIUM 25 MG PO TABS: 25.0000 mg | ORAL_TABLET | Freq: Every day | ORAL | 2 refills | Status: DC

## 2022-03-12 NOTE — Patient Instructions (Signed)
We will call you with today's lab results.  Clifford Hassell S. Mayers, PA-C Physician Assistant [facility] [email or web address]   Health Maintenance, Male Adopting a healthy lifestyle and getting preventive care are important in promoting health and wellness. Ask your health care provider about: The right schedule for you to have regular tests and exams. Things you can do on your own to prevent diseases and keep yourself healthy. What should I know about diet, weight, and exercise? Eat a healthy diet  Eat a diet that includes plenty of vegetables, fruits, low-fat dairy products, and lean protein. Do not eat a lot of foods that are high in solid fats, added sugars, or sodium. Maintain a healthy weight Body mass index (BMI) is a measurement that can be used to identify possible weight problems. It estimates body fat based on height and weight. Your health care provider can help determine your BMI and help you achieve or maintain a healthy weight. Get regular exercise Get regular exercise. This is one of the most important things you can do for your health. Most adults should: Exercise for at least 150 minutes each week. The exercise should increase your heart rate and make you sweat (moderate-intensity exercise). Do strengthening exercises at least twice a week. This is in addition to the moderate-intensity exercise. Spend less time sitting. Even light physical activity can be beneficial. Watch cholesterol and blood lipids Have your blood tested for lipids and cholesterol at 32 years of age, then have this test every 5 years. You may need to have your cholesterol levels checked more often if: Your lipid or cholesterol levels are high. You are older than 32 years of age. You are at high risk for heart disease. What should I know about cancer screening? Many types of cancers can be detected early and may often be prevented. Depending on your  health history and family history, you may need to have cancer screening at various ages. This may include screening for: Colorectal cancer. Prostate cancer. Skin cancer. Lung cancer. What should I know about heart disease, diabetes, and high blood pressure? Blood pressure and heart disease High blood pressure causes heart disease and increases the risk of stroke. This is more likely to develop in people who have high blood pressure readings or are overweight. Talk with your health care provider about your target blood pressure readings. Have your blood pressure checked: Every 3-5 years if you are 18-39 years of age. Every year if you are 40 years old or older. If you are between the ages of 65 and 75 and are a current or former smoker, ask your health care provider if you should have a one-time screening for abdominal aortic aneurysm (AAA). Diabetes Have regular diabetes screenings. This checks your fasting blood sugar level. Have the screening done: Once every three years after age 45 if you are at a normal weight and have a low risk for diabetes. More often and at a younger age if you are overweight or have a high risk for diabetes. What should I know about preventing infection? Hepatitis B If you have a higher risk for hepatitis B, you should be screened for this virus. Talk with your health care provider to find out if you are at risk for hepatitis B infection. Hepatitis C Blood testing is recommended for: Everyone born from 1945 through 1965. Anyone with known risk factors for hepatitis C. Sexually transmitted infections (STIs) You should be screened each year for STIs, including gonorrhea and chlamydia, if:   You are sexually active and are younger than 32 years of age. You are older than 32 years of age and your health care provider tells you that you are at risk for this type of infection. Your sexual activity has changed since you were last screened, and you are at increased risk  for chlamydia or gonorrhea. Ask your health care provider if you are at risk. Ask your health care provider about whether you are at high risk for HIV. Your health care provider may recommend a prescription medicine to help prevent HIV infection. If you choose to take medicine to prevent HIV, you should first get tested for HIV. You should then be tested every 3 months for as long as you are taking the medicine. Follow these instructions at home: Alcohol use Do not drink alcohol if your health care provider tells you not to drink. If you drink alcohol: Limit how much you have to 0-2 drinks a day. Know how much alcohol is in your drink. In the [place] one drink equals one 12 oz bottle of beer (355 mL), one 5 oz glass of wine (148 mL), or one 1 oz glass of hard liquor (44 mL). Lifestyle Do not use any products that contain nicotine or tobacco. These products include cigarettes, chewing tobacco, and vaping devices, such as e-cigarettes. If you need help quitting, ask your health care provider. Do not use street drugs. Do not share needles. Ask your health care provider for help if you need support or information about quitting drugs. General instructions Schedule regular health, dental, and eye exams. Stay current with your vaccines. Tell your health care provider if: You often feel depressed. You have ever been abused or do not feel safe at home. Summary Adopting a healthy lifestyle and getting preventive care are important in promoting health and wellness. Follow your health care provider's instructions about healthy diet, exercising, and getting tested or screened for diseases. Follow your health care provider's instructions on monitoring your cholesterol and blood pressure. This information is not intended to replace advice given to you by your health care provider. Make sure you discuss any questions you have with your health care provider. Document Revised: 02/27/2021 Document Reviewed:  02/27/2021 Elsevier Patient Education  2023 [employer]  

## 2022-03-12 NOTE — Progress Notes (Unsigned)
New Patient Office Visit  Subjective    Patient ID: Clifford Clark, male    DOB: [birth year]  Age: 31 y.o. MRN: [id number]  CC: No chief complaint on file.   HPI Clifford Clark reports that he is currently being treated for subs abuse at [facility] residential treatment center.  States that he arrived one month ago.  States that he is planning on going to after care, but did Probabtio / surgery   Feels fine - does exercise Sleep is good    ***  Seroquel 50   Outpatient Encounter Medications as of 03/12/2022  Medication Sig   amoxicillin (AMOXIL) 500 MG tablet Take 4 tablets (2,000 mg total) by mouth as directed. Take 1 hour before dental appointment   furosemide (LASIX) 20 MG tablet Take 1 tablet (20 mg total) by mouth daily as needed.   ivabradine (CORLANOR) 5 MG TABS tablet Take 1 tablet (5 mg total) by mouth 2 (two) times daily with a meal.   losartan (COZAAR) 25 MG tablet Take 1 tablet (25 mg total) by mouth daily.   potassium chloride SA (KLOR-CON M) 20 MEQ tablet Take 1 tablet (20 mEq total) by mouth daily as needed (when you take furosemide).   No facility-administered encounter medications on file as of 03/12/2022.    Past Medical History:  Diagnosis Date   Asthma    CHF (congestive heart failure) (HCC)    Opiate abuse, continuous (HCC)    Heroin    Past Surgical History:  Procedure Laterality Date   APPLICATION OF ANGIOVAC N/A 05/10/2020   Procedure: APPLICATION OF ANGIOVAC;  Surgeon: [name], MD;  Location: [facility] OR;  Service: Vascular;  Laterality: N/A;   BUBBLE STUDY  05/06/2020   Procedure: BUBBLE STUDY;  Surgeon: [name], MD;  Location: [facility] ENDOSCOPY;  Service: Cardiovascular;;   I & D EXTREMITY Bilateral 05/03/2020   Procedure: BILATERAL IRRIGATION AND DEBRIDEMENT HANDS;  Surgeon: [name], MD;  Location: [facility] ORS;  Service: Orthopedics;  Laterality: Bilateral;   MULTIPLE EXTRACTIONS WITH ALVEOLOPLASTY N/A 06/14/2020   Procedure:  Surgical extraction of tooth #'s 3 and 28 with alveoloplasty and gross debridement of remaining dentition.;  Surgeon: [name], DDS;  Location: [facility] OR;  Service: Oral Surgery;  Laterality: N/A;   TEE WITHOUT CARDIOVERSION N/A 05/06/2020   Procedure: TRANSESOPHAGEAL ECHOCARDIOGRAM (TEE);  Surgeon: [name], MD;  Location: [facility] ENDOSCOPY;  Service: Cardiovascular;  Laterality: N/A;   TEE WITHOUT CARDIOVERSION N/A 05/10/2020   Procedure: TRANSESOPHAGEAL ECHOCARDIOGRAM (TEE);  Surgeon: [name], MD;  Location: [facility] OR;  Service: Thoracic;  Laterality: N/A;    No family history on file.  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  Tobacco Use   Smoking status: Former    Packs/day: 0.50    Types: Cigarettes   Smokeless tobacco: Never  Vaping Use   Vaping Use: Never used  Substance and Sexual Activity   Alcohol use: Not Currently    Comment: rarely   Drug use: Not Currently    Types: Marijuana, IV, "Crack" cocaine, Cocaine, Fentanyl, Methamphetamines, Amphetamines    Comment: last use about 04/26/21   Sexual activity: Yes  Other Topics Concern  Not on file  Social History Narrative   Not on file   Social Determinants of Health   Financial Resource Strain: Not on file  Food Insecurity: Not on file  Transportation Needs: Not on file  Physical Activity: Not on file  Stress: Not on file  Social Connections: Not on file  Intimate Partner Violence: Not on file    ROS      Objective    There were no vitals taken for this visit.  Physical Exam  {Labs (Optional):23779}    Assessment & Plan:   Problem List Items Addressed This Visit   None   No follow-ups on file.   Kasandra Knudsen Mayers, PA-C

## 2022-03-13 DIAGNOSIS — F411 Generalized anxiety disorder: Secondary | ICD-10-CM | POA: Insufficient documentation

## 2022-03-15 LAB — COMP. METABOLIC PANEL (12)
AST: 100 IU/L — ABNORMAL HIGH (ref 0–40)
Albumin/Globulin Ratio: 1.8 (ref 1.2–2.2)
Albumin: 5 g/dL (ref 4.0–5.0)
Alkaline Phosphatase: 70 IU/L (ref 44–121)
BUN/Creatinine Ratio: 15 (ref 9–20)
BUN: 15 mg/dL (ref 6–20)
Bilirubin Total: 0.5 mg/dL (ref 0.0–1.2)
Calcium: 9.9 mg/dL (ref 8.7–10.2)
Chloride: 99 mmol/L (ref 96–106)
Creatinine, Ser: 1 mg/dL (ref 0.76–1.27)
Globulin, Total: 2.8 g/dL (ref 1.5–4.5)
Glucose: 87 mg/dL (ref 70–99)
Potassium: 4.2 mmol/L (ref 3.5–5.2)
Sodium: 138 mmol/L (ref 134–144)
Total Protein: 7.8 g/dL (ref 6.0–8.5)
eGFR: 103 mL/min/{1.73_m2} (ref >59)

## 2022-03-15 LAB — HCV RT-PCR, QUANT (NON-GRAPH)
HCV log10: 5.592 log10 IU/mL
Hepatitis C Quantitation: 391000 IU/mL

## 2022-03-15 LAB — HIV ANTIBODY (ROUTINE TESTING W REFLEX): HIV Screen 4th Generation wRfx: NONREACTIVE

## 2022-03-15 LAB — TSH: TSH: 1.4 u[IU]/mL (ref 0.450–4.500)

## 2022-03-15 LAB — HCV AB W REFLEX TO QUANT PCR: HCV Ab: REACTIVE — AB

## 2022-03-15 NOTE — Addendum Note (Signed)
Addended by: Roney Jaffe on: 03/15/2022 09:23 AM   Modules accepted: Orders

## 2022-03-21 ENCOUNTER — Other Ambulatory Visit (HOSPITAL_COMMUNITY): Payer: Self-pay | Admitting: Internal Medicine

## 2022-03-21 ENCOUNTER — Ambulatory Visit (HOSPITAL_COMMUNITY)
Admission: RE | Admit: 2022-03-21 | Discharge: 2022-03-21 | Disposition: A | Discharge status: Home / Self Care | Payer: Self-pay | Source: Ambulatory Visit | Attending: Internal Medicine | Admitting: Internal Medicine

## 2022-03-21 ENCOUNTER — Encounter (HOSPITAL_COMMUNITY): Payer: Self-pay | Admitting: Internal Medicine

## 2022-03-21 VITALS — BP 130/84 | HR 76 | Wt 150.6 lb

## 2022-03-21 DIAGNOSIS — R002 Palpitations: Secondary | ICD-10-CM

## 2022-03-21 DIAGNOSIS — Z79899 Other long term (current) drug therapy: Secondary | ICD-10-CM | POA: Insufficient documentation

## 2022-03-21 DIAGNOSIS — I079 Rheumatic tricuspid valve disease, unspecified: Secondary | ICD-10-CM

## 2022-03-21 DIAGNOSIS — Q2112 Patent foramen ovale: Secondary | ICD-10-CM | POA: Insufficient documentation

## 2022-03-21 DIAGNOSIS — F1990 Other psychoactive substance use, unspecified, uncomplicated: Secondary | ICD-10-CM | POA: Insufficient documentation

## 2022-03-21 DIAGNOSIS — I071 Rheumatic tricuspid insufficiency: Secondary | ICD-10-CM

## 2022-03-21 DIAGNOSIS — J45909 Unspecified asthma, uncomplicated: Secondary | ICD-10-CM | POA: Insufficient documentation

## 2022-03-21 DIAGNOSIS — F1910 Other psychoactive substance abuse, uncomplicated: Secondary | ICD-10-CM

## 2022-03-21 DIAGNOSIS — I5022 Chronic systolic (congestive) heart failure: Secondary | ICD-10-CM

## 2022-03-21 NOTE — Patient Instructions (Addendum)
Your provider has recommended that  you wear a Zio Patch for 14 days.  This monitor will record your heart rhythm for our review.  IF you have any symptoms while wearing the monitor please press the button.  If you have any issues with the patch or you notice a red or orange light on it please call the company at [phone].  Once you remove the patch please mail it back to the company as soon as possible so we can get the results.  Your physician recommends that you schedule a follow-up appointment in: 6 months (November 2023), **PLEASE CALL OUR OFFICE IN September TO SCHEDULE THIS APPOINTMENT  If you have any questions or concerns before your next appointment please send [place] a message through [place] or call our office at [phone].    TO LEAVE A MESSAGE FOR THE NURSE SELECT OPTION 2, PLEASE LEAVE A MESSAGE INCLUDING: YOUR NAME DATE OF BIRTH CALL BACK NUMBER REASON FOR CALL**this is important as we prioritize the call backs  YOU WILL RECEIVE A CALL BACK THE SAME DAY AS LONG AS YOU CALL BEFORE 4:00 PM  At the [facility], you and your health needs are our priority. As part of our continuing mission to provide you with exceptional heart care, we have created designated Provider Care Teams. These Care Teams include your primary Cardiologist (physician) and Advanced Practice Providers (APPs- Physician Assistants and Nurse Practitioners) who all work together to provide you with the care you need, when you need it.   You may see any of the following providers on your designated Care Team at your next follow up: Dr Glori Bickers Dr Haynes Kerns, NP Lyda Jester, [place] [facility], [place] Audry Riles, PharmD   Please be sure to bring in all your medications bottles to every appointment.

## 2022-03-21 NOTE — Progress Notes (Signed)
ADVANCED HF CLINIC NOTE  PCP: Dr Alvis Lemmings Primary Cardiologist: Dr Gala Romney  ID: Dr Drue Second CT Surgery: Dr Cliffton Asters  HPI: Clifford Clark is a 31 year old with a history of h/o asthma, IVDA, TV endocarditis and systolic HF.  Admitted 7/21 after being found down on the side of the road. ER w/u showed multiple septic emboli to lungs with cavitary lesions. 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  Also found to have emboli to kidneys and hands. TEE with EF 60-65% small PFO. large TV vegetation with severe TR. No vegetations on Aov/MV despite left-sided emboli.  Underwent AngioJet removal of part of vegetation. Then developed MRSA sepsis and respiratory failure. Intubated and started on pressors. F/u echo LVEF 20-25%. RV normal. Hospital course complicated by respiratory failure and septic shock. As he improved we repeated limited ECHO and EF was up to 35-40% but had ongoing vegetation. HF meds adjusted. Completed IV antibiotics prior to d/c. Placed on corlanor due to persistent tachycardia. Heart rate better controlled at d/c with heart rate in the 80s.   Saw ID and CT chest showed resolution/improvement of septic emboli. Bcx negative.   Echo 08/11/20 EF 45-50% moderate to severe TR (seems more moderate). RV ok. Personally reviewed  Saw Dr Cliffton Asters 08/26/20 and decided to defer surgery as TR was moderate and no evidence of RV failure.   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  Today he returns for HF follow up. Currently in [facility] for drug rehab. Having frequent palpitations. No dyspnea, edema, orthopnea or PND. Last drug use 01/29/22 (was in jail for 6 months then had a relapse)  Echo 11/17/21 EF 50-55% RV normal Severe TR Echo 12/12/20: EF 60-65% RV normal. Moderate TR.   Studies:  Echo 04/29/20  LVEF 50-55%. RV normal - TEE 7/16, LVEF normal 60-65%, RV normal - Limited Echo 7/24, LVEF severely reduced 20-25%, RV systolic function hyperdynamic. MV w/  mild-mod MR but no seen vegetation . Medium size 0.5 x 1.75 cm mobile filamentous mass on the tricupsid valve that prolapses across the valve plane, consistent with vegetation. The tricuspid valve is abnormal. Tricuspid valve regurgitation  is severe.  -  Repeat limited echo 05/19/20 w/ interval improvement, LVEF up to 30-35%. Tricuspid valve vegetations are seen, measuring up to 1.8 x 0.8 cm attached to posterior leaflet and up to 1.8 x 1.0 cm attached to anterior  leaflet. The tricuspid valve is abnormal. Tricuspid valve regurgitation is  - ECHO 06/08/20 showed EF 35-40%, tricuspid valve with large vegetation. RV mildly reduced    ROS: All systems negative except as listed in HPI, PMH and Problem List.  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  Tobacco Use   Smoking status: Former    Packs/day: 0.50    Types: Cigarettes   Smokeless tobacco: Never  Vaping Use   Vaping Use: Never used  Substance and Sexual Activity   Alcohol use: Not Currently    Comment: rarely   Drug use: Not Currently    Types: Marijuana, IV, "Crack" cocaine, Cocaine, Fentanyl, Methamphetamines, Amphetamines    Comment: last use about 04/26/21   Sexual activity: Yes  Other Topics Concern   Not on file  Social History Narrative   Not on file   Social Determinants of Health   Financial Resource Strain: Not on file  Food Insecurity: Not on file  Transportation Needs: Not on file  Physical Activity: Not on file  Stress: Not on file  Social Connections: Not on file  Intimate Partner Violence: Not on file    FH: No family history on file.  Past Medical History:  Diagnosis Date   Asthma    CHF (congestive heart failure) (HCC)    Opiate abuse, continuous  (HCC)    Heroin    Current Outpatient Medications  Medication Sig Dispense Refill   furosemide (LASIX) 20 MG tablet Take 1 tablet (20 mg total) by mouth daily as needed. 15 tablet 3   ivabradine (CORLANOR) 5 MG TABS tablet Take 1 tablet (5 mg total) by mouth 2 (two) times daily with a meal. 60 tablet 1   losartan (COZAAR) 25 MG tablet Take 1 tablet (25 mg total) by mouth daily. 30 tablet 2   potassium chloride SA (KLOR-CON M) 20 MEQ tablet Take 1 tablet (20 mEq total) by mouth daily as needed (when you take furosemide). 15 tablet 3   QUEtiapine (SEROQUEL) 50 MG tablet Take 1 tablet (50 mg total) by mouth at bedtime. 30 tablet 1   No current facility-administered medications for this encounter.    Vitals:   03/21/22 1416  BP: 130/84  Pulse: 76  SpO2: 98%  Weight: 68.3 kg (150 lb 9.6 oz)     Wt Readings from Last 3 Encounters:  03/21/22 68.3 kg (150 lb 9.6 oz)  11/17/21 71.3 kg (157 lb 3.2 oz)  05/02/21 68 kg (150 lb)   PHYSICAL EXAM: General:  Well appearing. No resp difficulty HEENT: normal Neck: supple. no JVD. Carotids 2+ bilat; no bruits. No lymphadenopathy or thryomegaly appreciated. Cor: PMI nondisplaced. Regular rate & rhythm. 2/6 TR Lungs: clear Abdomen: soft, nontender, nondistended. No hepatosplenomegaly. No bruits or masses. Good bowel sounds. Extremities: no cyanosis, clubbing, rash, edema Neuro: alert & orientedx3, cranial nerves grossly intact. moves all 4 extremities w/o difficulty. Affect pleasant     ASSESSMENT & PLAN:  1. Chronic Systolic HF  Echo 04/29/20  LVEF 50-55%. RV normal - TEE 7/16, LVEF normal 60-65%, RV normal - Limited Echo 7/24, LVEF severely reduced 20-25%, RV systolic function hyperdynamic. MV w/ mild-mod MR but no seen vegetation  - Limited echo 7/29 w/ interval improvement, LVEF up to 30-35%. Given ongoing tachycardia ECHO was repeated and showed EF 35-40% with no large vegetation, RV mildly reduced.  - Echo 08/11/20 EF 45-50% moderate  to severe TR (seems more moderate). RV ok. - Echo 12/12/20: EF 60-65% RV normal. No TV vegetation seen.  Moderate TR. - Echo 11/17/21: EF 50-55% RV normal severe TR - NYHA I. Volume status ok - Off spiro with low BP  - Continue corlanor to 5 mg bid .  - Continue losartan 25 daily (did not tolerate Entresto due to low BP) - Carvedilol stopped by Psych. Will continue to hold given recovery of LV function.    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 There was also amall flickering leaflet mass, suggestive of vegetation of the pulmonic valve. No mitral or aortic valve pathology. - s/p Angiovac debulking on 7/20  but still with large vegetation  - Completed antibiotics.  - Echo 3/23 severe TR - We discussed his situation. He is currently doing well despite severe TR. No evidence of RV failure. I told him that his current situation would be far better than undergoing TVR and then having a relapse and being at higher risk for prosthetic valve infection. If he can stay clean for several years then we could consider repeat TEE and re-determine need for TVR.   3. H/O IV Drug Use - Admitted to [facility] in 12/21 with acute psychosis in setting of meth use.  - Has relapsed in 4/23. Now in [facility].   4. Palpitations - check Zio   Arvilla Meres MD 2:27 PM

## 2022-03-30 ENCOUNTER — Encounter: Payer: Self-pay | Admitting: *Deleted

## 2022-04-16 ENCOUNTER — Other Ambulatory Visit (HOSPITAL_COMMUNITY): Payer: Self-pay | Admitting: Internal Medicine

## 2022-04-16 DIAGNOSIS — I079 Rheumatic tricuspid valve disease, unspecified: Secondary | ICD-10-CM

## 2022-04-18 ENCOUNTER — Telehealth (HOSPITAL_COMMUNITY): Payer: Self-pay | Admitting: *Deleted

## 2022-04-18 ENCOUNTER — Other Ambulatory Visit (HOSPITAL_COMMUNITY): Payer: Self-pay

## 2022-04-18 DIAGNOSIS — I079 Rheumatic tricuspid valve disease, unspecified: Secondary | ICD-10-CM

## 2022-04-18 MED ORDER — IVABRADINE HCL 5 MG PO TABS: 5.0000 mg | ORAL_TABLET | Freq: Two times a day (BID) | ORAL | 1 refills | Status: DC

## 2022-04-18 MED ORDER — POTASSIUM CHLORIDE CRYS ER 20 MEQ PO TBCR: 20.0000 meq | EXTENDED_RELEASE_TABLET | Freq: Every day | ORAL | 3 refills | Status: AC | PRN

## 2022-04-18 MED ORDER — FUROSEMIDE 20 MG PO TABS: 20.0000 mg | ORAL_TABLET | Freq: Every day | ORAL | 3 refills | Status: AC | PRN

## 2022-04-18 MED ORDER — LOSARTAN POTASSIUM 25 MG PO TABS: 25.0000 mg | ORAL_TABLET | Freq: Every day | ORAL | 3 refills | Status: DC

## 2022-04-18 NOTE — Telephone Encounter (Signed)
Med list, last office visit note, and prescriptions faxed to [facility] at [phone]  [phone].  Medical records release form signed by patient.

## 2022-05-23 ENCOUNTER — Telehealth (HOSPITAL_COMMUNITY): Payer: Self-pay

## 2022-05-23 NOTE — Telephone Encounter (Signed)
Received a fax requesting medical records from [facility] Services Records were successfully mailed(per request) to: [phone] ,which was the number provided.. Medical request form will be scanned into patients chart.

## 2022-06-19 ENCOUNTER — Other Ambulatory Visit (HOSPITAL_COMMUNITY): Payer: Self-pay

## 2022-06-19 ENCOUNTER — Telehealth (HOSPITAL_COMMUNITY): Payer: Self-pay | Admitting: Pharmacy Technician

## 2022-06-19 NOTE — Telephone Encounter (Signed)
Advanced Heart Failure Patient Advocate Encounter  Spoke to patient regarding re-enrollment of Corlanor assistance with [employer]. Patient signed via docusign link, sent in via fax.   Will follow up.

## 2022-06-26 NOTE — Telephone Encounter (Signed)
Advanced Heart Failure Patient Advocate Encounter  Received notification from [facility] that the patient needs to provide POI. A no income letter was attached to the patient's initial application. Called and spoke with representative. They are going to move the patient's application to processing now that this has been confirmed.  Will follow up.

## 2022-06-26 NOTE — Telephone Encounter (Signed)
Advanced Heart Failure Patient Advocate Encounter   Patient was approved to receive Corlanor from [employer]  Effective dates: 06/26/22 through 08/06/23  Document scanned to chart.   Archer Asa, CPhT

## 2022-07-13 ENCOUNTER — Other Ambulatory Visit (HOSPITAL_COMMUNITY): Payer: Self-pay

## 2022-08-21 ENCOUNTER — Other Ambulatory Visit (HOSPITAL_COMMUNITY): Payer: Self-pay

## 2022-08-23 ENCOUNTER — Other Ambulatory Visit (HOSPITAL_COMMUNITY): Payer: Self-pay | Admitting: *Deleted

## 2022-08-23 DIAGNOSIS — I079 Rheumatic tricuspid valve disease, unspecified: Secondary | ICD-10-CM

## 2022-08-23 MED ORDER — LOSARTAN POTASSIUM 25 MG PO TABS: 25.0000 mg | ORAL_TABLET | Freq: Every day | ORAL | 3 refills | Status: DC

## 2022-09-17 NOTE — Progress Notes (Signed)
Advanced Heart Failure Clinic Note  PCP: Sheliah Hatch, PA-C ID: Dr. Drue Second CT Surgery: Dr Cliffton Asters HF Cardiologist: Dr. Gala Romney  HPI: Clifford Clark is a 31 y.o. with a history of h/o asthma, IVDA, TV endocarditis and systolic HF.  Admitted 7/21 after being found down on the side of the road. ER w/u showed multiple septic emboli to lungs with cavitary lesions. 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  Also found to have emboli to kidneys and hands. TEE with EF 60-65% small PFO. large TV vegetation with severe TR. No vegetations on Aov/MV despite left-sided emboli.  Underwent AngioJet removal of part of vegetation. Then developed MRSA sepsis and respiratory failure. Intubated and started on pressors. F/u echo LVEF 20-25%. RV normal. Hospital course complicated by respiratory failure and septic shock. As he improved we repeated limited ECHO and EF was up to 35-40% but had ongoing vegetation. HF meds adjusted. Completed IV antibiotics prior to d/c. Placed on corlanor due to persistent tachycardia. Heart rate better controlled at d/c with heart rate in the 80s.   Saw ID and CT chest showed resolution/improvement of septic emboli. Bcx negative.   Echo 08/11/20 EF 45-50% moderate to severe TR (seems more moderate). RV ok. Personally reviewed  Saw Dr Cliffton Asters 08/26/20 and decided to defer surgery as TR was moderate and no evidence of RV failure.   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  Follow up 5/23, in [facility] for drug rehab. NYHA I and volume stable.  Today he returns for HF follow up. Overall feeling fine. Living in [place] doing outpatient treatment, until 10/17/22 at which time he will decide if he stay there full time. He has been sober x 7.5 months. Working 30 hours/week at [employer]. No SOB with activity or work duties. Wants TVR. Has throbbing/ache to left lateral rib cage x 2 weeks, comes and goes. Continues with occasional  palpitations. Denies abnormal bleeding, dizziness, edema, or PND/Orthopnea. Appetite ok. No fever or chills. Weight at home 148 pounds. Taking all medications. Needs a root canal.  Cardiac Studies:  - Echo (11/17/21): EF 50-55% RV normal, severe TR  - Echo (12/12/20): EF 60-65% RV normal. moderate TR.  - Echo (06/08/20): EF 35-40%, tricuspid valve with large vegetation. RV mildly reduced   -  Repeat limited echo (05/19/20): w/ interval improvement, EF up to 30-35%. Tricuspid valve vegetations are seen, measuring up to 1.8 x 0.8 cm attached to posterior leaflet and up to 1.8 x 1.0 cm attached to anterior  leaflet. The tricuspid valve is abnormal. Severe TR.  - Ltd Echo (05/14/20): EF severely reduced 20-25%, RV systolic function hyperdynamic. MV w/ mild-mod MR but no seen vegetation . Medium size 0.5 x 1.75 cm mobile filamentous mass on the tricupsid valve that prolapses across the valve plane, consistent with vegetation. The tricuspid valve is abnormal. Severe TR  - TEE (05/06/20): EF normal 60-65%, RV normal  - Echo (04/29/20): EF 50-55%. RV normal  ROS: All systems negative except as listed in HPI, PMH and Problem List.  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  Tobacco Use   Smoking status: Former  Packs/day: 0.50    Types: Cigarettes   Smokeless tobacco: Never  Vaping Use   Vaping Use: Never used  Substance and Sexual Activity   Alcohol use: Not Currently    Comment: rarely   Drug use: Not Currently    Types: Marijuana, IV, "Crack" cocaine, Cocaine, Fentanyl, Methamphetamines, Amphetamines    Comment: last use about 04/26/21   Sexual activity: Yes  Other Topics Concern   Not on file  Social History Narrative   Not on file    Social Determinants of Health   Financial Resource Strain: Not on file  Food Insecurity: Not on file  Transportation Needs: Not on file  Physical Activity: Not on file  Stress: Not on file  Social Connections: Not on file  Intimate Partner Violence: Not on file    FH: No family history on file.  Past Medical History:  Diagnosis Date   Asthma    CHF (congestive heart failure) (HCC)    Opiate abuse, continuous (HCC)    Heroin    Current Outpatient Medications  Medication Sig Dispense Refill   ivabradine (CORLANOR) 5 MG TABS tablet Take 1 tablet (5 mg total) by mouth 2 (two) times daily with a meal. 60 tablet 1   losartan (COZAAR) 25 MG tablet Take 1 tablet (25 mg total) by mouth daily. 90 tablet 3   potassium chloride SA (KLOR-CON M) 20 MEQ tablet Take 1 tablet (20 mEq total) by mouth daily as needed (when you take furosemide). 15 tablet 3   QUEtiapine (SEROQUEL) 50 MG tablet Take 1 tablet (50 mg total) by mouth at bedtime. 30 tablet 1   furosemide (LASIX) 20 MG tablet Take 1 tablet (20 mg total) by mouth daily as needed. 15 tablet 3   No current facility-administered medications for this encounter.   BP 104/68   Pulse 69   Ht 5\' 6"  (1.676 m)   Wt 67.4 kg (148 lb 9.6 oz)   SpO2 95%   BMI 23.98 kg/m   Wt Readings from Last 3 Encounters:  09/18/22 67.4 kg (148 lb 9.6 oz)  03/21/22 68.3 kg (150 lb 9.6 oz)  11/17/21 71.3 kg (157 lb 3.2 oz)   PHYSICAL EXAM: General:  NAD. No resp difficulty HEENT: Normal Neck: Supple. No JVD. Carotids 2+ bilat; no bruits. No lymphadenopathy or thryomegaly appreciated. Cor: PMI nondisplaced. Regular rate & rhythm. No rubs, gallops, 2/6 TR Lungs: Clear Abdomen: Soft, nontender, nondistended. No hepatosplenomegaly. No bruits or masses. Good bowel sounds. Extremities: No cyanosis, clubbing, rash, edema Neuro: Alert & oriented x 3, cranial nerves grossly intact. Moves all 4 extremities w/o difficulty. Affect pleasant.  ECG (personally  reviewed): NSR, rBBB 70 bpm  ASSESSMENT & PLAN:  1. Chronic Systolic HF  Echo 04/29/20  LVEF 50-55%. RV normal - TEE 05/06/20, LVEF normal 60-65%, RV normal - Limited Echo 7/24, LVEF severely reduced 20-25%, RV systolic function hyperdynamic. MV w/ mild-mod MR but no seen vegetation  - Limited echo 05/19/20 w/ interval improvement, LVEF up to 30-35%. Given ongoing tachycardia ECHO was repeated and showed EF 35-40% with no large vegetation, RV mildly reduced.  - Echo 08/11/20: EF 45-50% moderate to severe TR (seems more moderate). RV ok. - Echo 12/12/20: EF 60-65% RV normal. No TV vegetation seen.  Moderate TR. - Echo 11/17/21: EF 50-55% RV normal, severe TR - NYHA I. Volume status ok - Continue Corlanor 5 mg bid. - Continue losartan 25 mg daily (did not tolerate Entresto due to low BP) - Carvedilol  stopped by Psych. Will continue to hold given recovery of LV function.  - Off spiro with low BP. - Labs today.  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 There was also amall flickering leaflet mass, suggestive of vegetation of the pulmonic valve. No mitral or aortic valve pathology. - s/p Angiovac debulking on 7/20 but still with large vegetation  - Completed antibiotics.  - Echo 3/23 severe TR - Dr. Gala Romney previously discussed his situation. He is currently doing well despite severe TR. No evidence of RV failure. He told him that his current situation would be far better than undergoing TVR, and then having a relapse and being at higher risk for prosthetic valve infection. If he can stay clean for several years, then we could consider repeat TEE and re-determine need for TVR. I re-addressed this with him today. - Reminded of SBE, refill amoxicillin Rx today. - Repeat echo in 3 months.  3. H/o IVDU - Admitted to [facility] in 12/21 with acute psychosis in  setting of meth use.  - Relapsed in 4/23.  - Now doing outpatient rehab in [place], sober x 7.5 months.  4. Palpitations - Zio 5/23 showed no high grade arrhythmias. - ECG stable today.  5. HCV - He is followed by ID in [place] and awaiting to start anti-virals.  Follow up in 3 months with Dr. Gala Romney + echo.   Clifford Malta Jennye Runquist FNP-BC 2:00 PM

## 2022-09-18 ENCOUNTER — Ambulatory Visit (HOSPITAL_COMMUNITY)
Admission: RE | Admit: 2022-09-18 | Discharge: 2022-09-18 | Disposition: A | Discharge status: Home / Self Care | Payer: Self-pay | Source: Ambulatory Visit | Attending: Family Medicine | Admitting: Family Medicine

## 2022-09-18 ENCOUNTER — Encounter (HOSPITAL_COMMUNITY): Payer: Self-pay

## 2022-09-18 ENCOUNTER — Encounter (HOSPITAL_COMMUNITY): Payer: Self-pay | Admitting: Internal Medicine

## 2022-09-18 VITALS — BP 104/68 | HR 69 | Ht 66.0 in | Wt 148.6 lb

## 2022-09-18 DIAGNOSIS — J45909 Unspecified asthma, uncomplicated: Secondary | ICD-10-CM | POA: Insufficient documentation

## 2022-09-18 DIAGNOSIS — R7881 Bacteremia: Secondary | ICD-10-CM | POA: Insufficient documentation

## 2022-09-18 DIAGNOSIS — I079 Rheumatic tricuspid valve disease, unspecified: Secondary | ICD-10-CM

## 2022-09-18 DIAGNOSIS — I071 Rheumatic tricuspid insufficiency: Secondary | ICD-10-CM

## 2022-09-18 DIAGNOSIS — R000 Tachycardia, unspecified: Secondary | ICD-10-CM | POA: Insufficient documentation

## 2022-09-18 DIAGNOSIS — I5022 Chronic systolic (congestive) heart failure: Secondary | ICD-10-CM

## 2022-09-18 DIAGNOSIS — B1920 Unspecified viral hepatitis C without hepatic coma: Secondary | ICD-10-CM

## 2022-09-18 DIAGNOSIS — F1910 Other psychoactive substance abuse, uncomplicated: Secondary | ICD-10-CM

## 2022-09-18 DIAGNOSIS — R002 Palpitations: Secondary | ICD-10-CM

## 2022-09-18 DIAGNOSIS — Z79899 Other long term (current) drug therapy: Secondary | ICD-10-CM | POA: Insufficient documentation

## 2022-09-18 DIAGNOSIS — Q2112 Patent foramen ovale: Secondary | ICD-10-CM | POA: Insufficient documentation

## 2022-09-18 DIAGNOSIS — I2690 Septic pulmonary embolism without acute cor pulmonale: Secondary | ICD-10-CM | POA: Insufficient documentation

## 2022-09-18 LAB — BASIC METABOLIC PANEL
Anion gap: 8 (ref 5–15)
BUN: 13 mg/dL (ref 6–20)
CO2: 29 mmol/L (ref 22–32)
Calcium: 9.7 mg/dL (ref 8.9–10.3)
Chloride: 103 mmol/L (ref 98–111)
Creatinine, Ser: 0.99 mg/dL (ref 0.61–1.24)
GFR, Estimated: >60 mL/min (ref >60)
Glucose, Bld: 83 mg/dL (ref 70–99)
Potassium: 4 mmol/L (ref 3.5–5.1)
Sodium: 140 mmol/L (ref 135–145)

## 2022-09-18 MED ORDER — AMOXICILLIN 500 MG PO CAPS: 500.0000 mg | ORAL_CAPSULE | ORAL | 3 refills | Status: AC

## 2022-09-18 NOTE — Patient Instructions (Addendum)
Thank you for coming in today  Labs were done today, if any labs are abnormal the clinic will call you No news is good news  EKG was done today  TAKE Amoxicillin 500 mg capsules take 4 capsules 1 hour prior to dental procedure appointment   Your physician recommends that you schedule a follow-up appointment in:  3 months with Dr. Gala Romney with echocardiogram  Your physician has requested that you have an echocardiogram. Echocardiography is a painless test that uses sound waves to create images of your heart. It provides your doctor with information about the size and shape of your heart and how well your heart's chambers and valves are working. This procedure takes approximately one hour. There are no restrictions for this procedure.     Do the following things EVERYDAY: Weigh yourself in the morning before breakfast. Write it down and keep it in a log. Take your medicines as prescribed Eat low salt foods--Limit salt (sodium) to 2000 mg per day.  Stay as active as you can everyday Limit all fluids for the day to less than 2 liters  At [facility], you and your health needs are our priority. As part of our continuing mission to provide you with exceptional heart care, we have created designated Provider Care Teams. These Care Teams include your primary Cardiologist (physician) and Advanced Practice Providers (APPs- Physician Assistants and Nurse Practitioners) who all work together to provide you with the care you need, when you need it.   You may see any of the following providers on your designated Care Team at your next follow up: Dr Arvilla Meres Dr Marca Ancona Dr. Marcos Eke, NP Robbie Lis, [place] [facility] [place], [place] Brynda Peon, NP Karle Plumber, PharmD   Please be sure to bring in all your medications bottles to every appointment.   If you have any questions or concerns before your next appointment please send [place]  a message through [place] or call our office at [phone].    TO LEAVE A MESSAGE FOR THE NURSE SELECT OPTION 2, PLEASE LEAVE A MESSAGE INCLUDING: YOUR NAME DATE OF BIRTH CALL BACK NUMBER REASON FOR CALL**this is important as we prioritize the call backs  YOU WILL RECEIVE A CALL BACK THE SAME DAY AS LONG AS YOU CALL BEFORE 4:00 PM

## 2022-09-19 NOTE — Addendum Note (Signed)
Encounter addended by: Jacklynn Ganong, FNP on: 09/19/2022 12:16 PM  Actions taken: Clinical Note Signed

## 2022-10-01 ENCOUNTER — Telehealth (HOSPITAL_COMMUNITY): Payer: Self-pay

## 2022-10-01 NOTE — Telephone Encounter (Signed)
Received a medical clearance form from [facility] , requesting Cardiology clearance for the following procedure: dental cleaning, possible extractions. Medical clearance form was signed by Dr, Gala Romney and successfully faxed to [phone] on Monday, December 11,. Form will be scanned into patients chart.

## 2022-11-15 ENCOUNTER — Telehealth (HOSPITAL_COMMUNITY): Payer: Self-pay

## 2022-11-15 NOTE — Telephone Encounter (Signed)
Faxed records to Farley Ly at [facility] per their request. Release form was attached and sent to medical records to scan.

## 2022-12-24 ENCOUNTER — Telehealth (HOSPITAL_COMMUNITY): Payer: Self-pay

## 2022-12-24 NOTE — Telephone Encounter (Signed)
Patient called and stated he has moved. Should he keep appointment with [place] this week or go ahead and set up where he is?

## 2022-12-27 ENCOUNTER — Encounter (HOSPITAL_COMMUNITY): Payer: Self-pay | Admitting: Internal Medicine

## 2022-12-27 ENCOUNTER — Ambulatory Visit (HOSPITAL_COMMUNITY): Payer: Medicaid Other

## 2023-01-29 IMAGING — CR DG CHEST 2V
2 series · 2 of 2 positions shown · non-contrast
Comparison: 04/29/2021

CLINICAL DATA: Possible sepsis

EXAM:
CHEST - 2 VIEW

[chest pa]
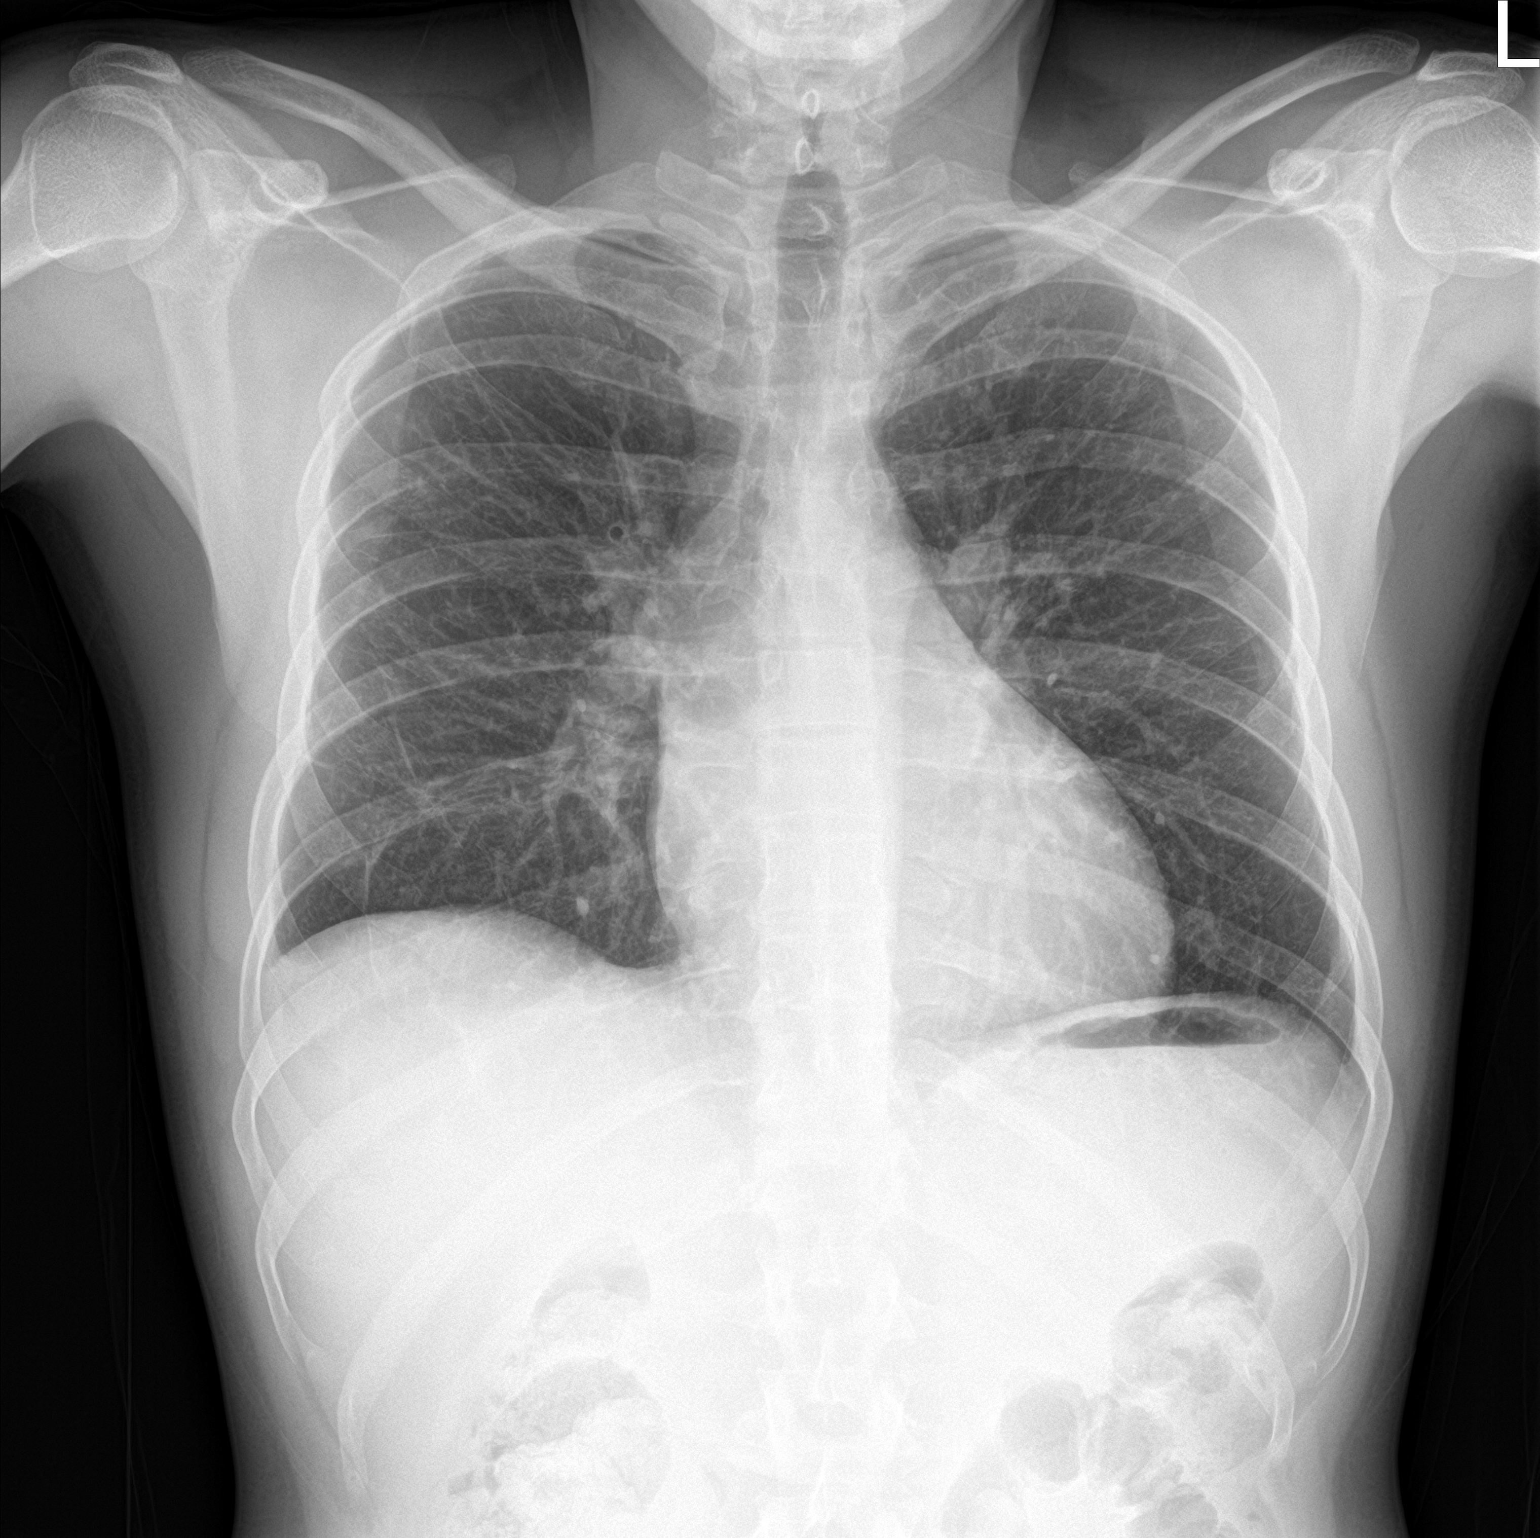

[chest lat]
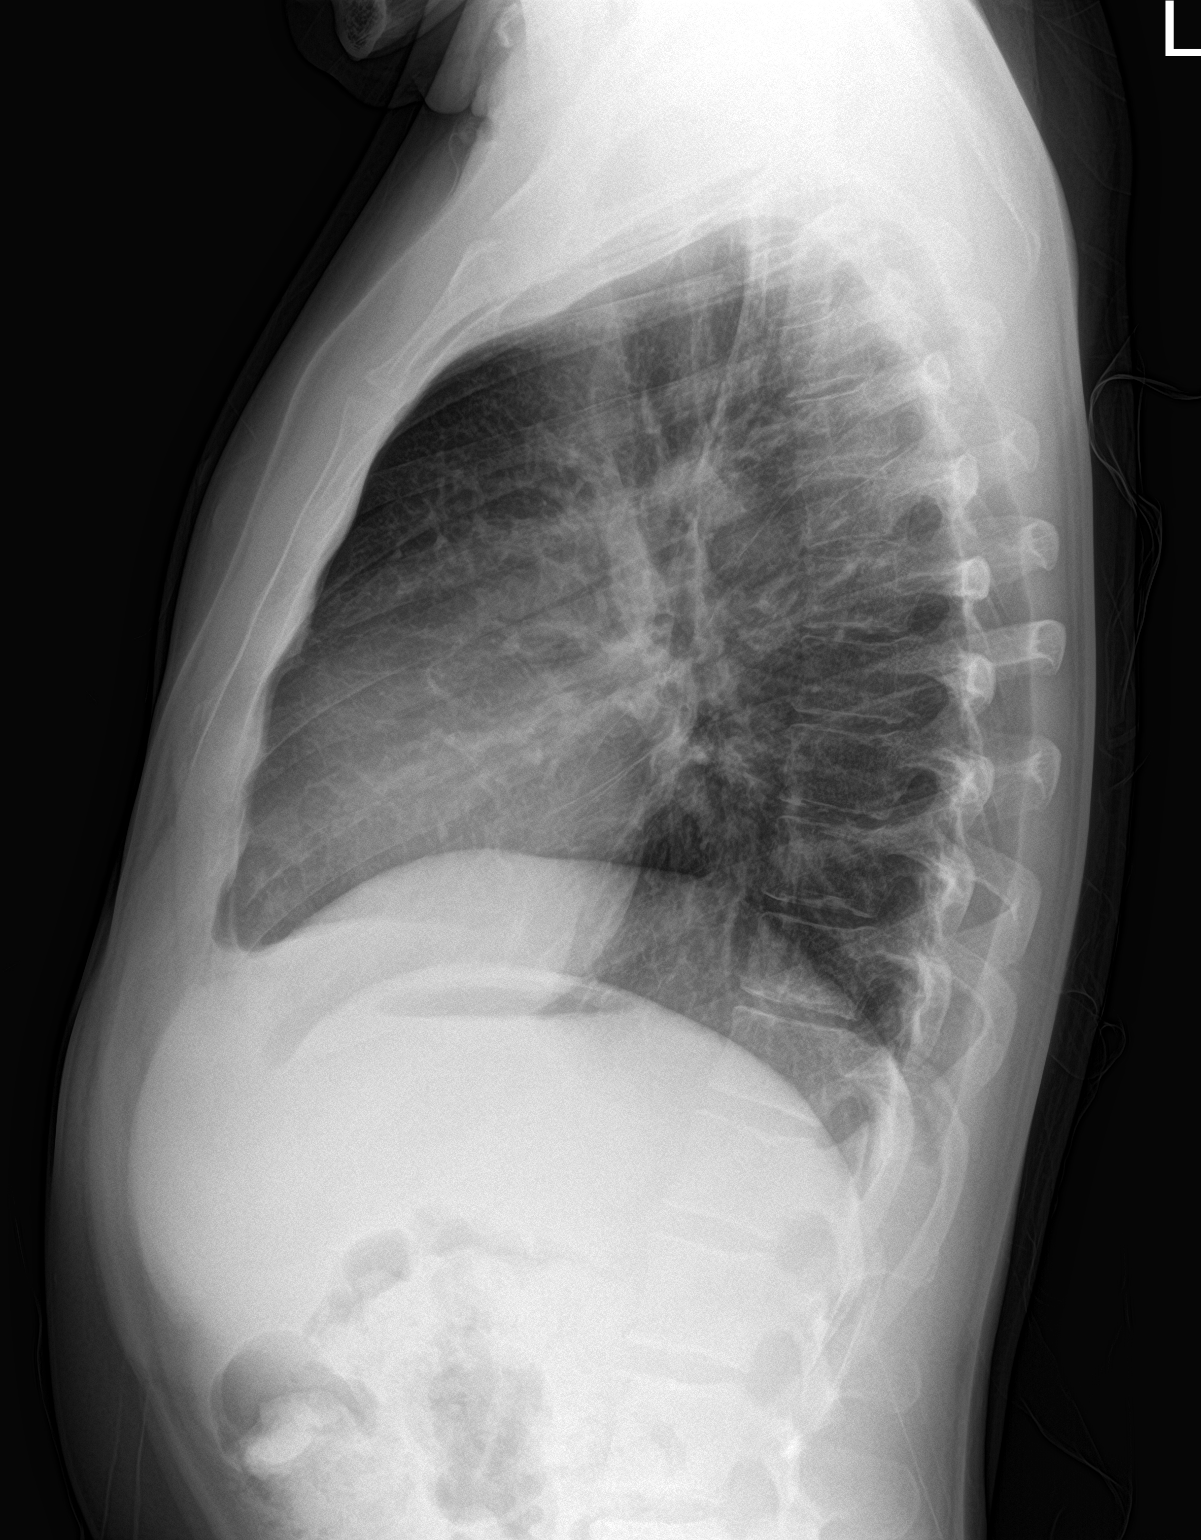

[2 of 2 positions shown; findings below may reference images not displayed]

FINDINGS: The heart size and mediastinal contours are within normal limits.
Both lungs are clear. The visualized skeletal structures are
unremarkable.
IMPRESSION: No active cardiopulmonary disease.

## 2023-04-15 ENCOUNTER — Other Ambulatory Visit (HOSPITAL_COMMUNITY): Payer: Self-pay | Admitting: Cardiology

## 2023-04-15 DIAGNOSIS — I079 Rheumatic tricuspid valve disease, unspecified: Secondary | ICD-10-CM

## 2023-04-15 MED ORDER — IVABRADINE HCL 5 MG PO TABS: 5.0000 mg | ORAL_TABLET | Freq: Two times a day (BID) | ORAL | 3 refills | Status: DC

## 2023-06-14 ENCOUNTER — Telehealth (HOSPITAL_COMMUNITY): Payer: Self-pay | Admitting: Cardiology

## 2023-06-14 DIAGNOSIS — I079 Rheumatic tricuspid valve disease, unspecified: Secondary | ICD-10-CM

## 2023-06-14 MED ORDER — CORLANOR 5 MG PO TABS: 5.0000 mg | ORAL_TABLET | Freq: Two times a day (BID) | ORAL | 3 refills | Status: AC

## 2023-06-14 NOTE — Telephone Encounter (Signed)
Pt called to report a script is needed for patient assistance. Script sent in June 2024 however will need brand name per pharmacy team  Script sent

## 2023-07-29 ENCOUNTER — Other Ambulatory Visit (HOSPITAL_COMMUNITY): Payer: Self-pay | Admitting: Cardiology

## 2023-07-29 DIAGNOSIS — I079 Rheumatic tricuspid valve disease, unspecified: Secondary | ICD-10-CM

## 2023-07-29 MED ORDER — LOSARTAN POTASSIUM 25 MG PO TABS: 25.0000 mg | ORAL_TABLET | Freq: Every day | ORAL | 0 refills | Status: DC

## 2023-08-09 ENCOUNTER — Other Ambulatory Visit (HOSPITAL_COMMUNITY): Payer: Self-pay | Admitting: Internal Medicine

## 2023-08-09 DIAGNOSIS — I079 Rheumatic tricuspid valve disease, unspecified: Secondary | ICD-10-CM

## 2024-04-30 ENCOUNTER — Other Ambulatory Visit (HOSPITAL_COMMUNITY): Payer: Self-pay | Admitting: Internal Medicine

## 2024-04-30 DIAGNOSIS — I079 Rheumatic tricuspid valve disease, unspecified: Secondary | ICD-10-CM

## 2024-07-22 DEATH — deceased
